# Patient Record
Sex: Female | Born: 1935 | Race: White | Hispanic: No | Smoking: Never smoker
Health system: Southern US, Community
[De-identification: ages and names within clinical notes are randomized; demographics above are authoritative.]

## PROBLEM LIST (undated history)

## (undated) DIAGNOSIS — J90 Pleural effusion, not elsewhere classified: Secondary | ICD-10-CM

## (undated) DIAGNOSIS — Z8673 Personal history of transient ischemic attack (TIA), and cerebral infarction without residual deficits: Secondary | ICD-10-CM

## (undated) DIAGNOSIS — I4819 Other persistent atrial fibrillation: Secondary | ICD-10-CM

## (undated) DIAGNOSIS — H269 Unspecified cataract: Secondary | ICD-10-CM

## (undated) DIAGNOSIS — G8929 Other chronic pain: Secondary | ICD-10-CM

## (undated) DIAGNOSIS — M5030 Other cervical disc degeneration, unspecified cervical region: Secondary | ICD-10-CM

## (undated) DIAGNOSIS — F419 Anxiety disorder, unspecified: Secondary | ICD-10-CM

## (undated) DIAGNOSIS — M1990 Unspecified osteoarthritis, unspecified site: Secondary | ICD-10-CM

## (undated) DIAGNOSIS — N649 Disorder of breast, unspecified: Secondary | ICD-10-CM

## (undated) DIAGNOSIS — I517 Cardiomegaly: Secondary | ICD-10-CM

## (undated) DIAGNOSIS — I10 Essential (primary) hypertension: Secondary | ICD-10-CM

## (undated) DIAGNOSIS — F0390 Unspecified dementia without behavioral disturbance: Secondary | ICD-10-CM

## (undated) DIAGNOSIS — R42 Dizziness and giddiness: Secondary | ICD-10-CM

## (undated) DIAGNOSIS — E785 Hyperlipidemia, unspecified: Secondary | ICD-10-CM

## (undated) DIAGNOSIS — I639 Cerebral infarction, unspecified: Secondary | ICD-10-CM

## (undated) DIAGNOSIS — H8110 Benign paroxysmal vertigo, unspecified ear: Secondary | ICD-10-CM

## (undated) DIAGNOSIS — Q676 Pectus excavatum: Secondary | ICD-10-CM

## (undated) DIAGNOSIS — S5290XA Unspecified fracture of unspecified forearm, initial encounter for closed fracture: Secondary | ICD-10-CM

## (undated) DIAGNOSIS — R911 Solitary pulmonary nodule: Secondary | ICD-10-CM

## (undated) DIAGNOSIS — U071 COVID-19: Secondary | ICD-10-CM

## (undated) DIAGNOSIS — E559 Vitamin D deficiency, unspecified: Secondary | ICD-10-CM

## (undated) DIAGNOSIS — Z8542 Personal history of malignant neoplasm of other parts of uterus: Secondary | ICD-10-CM

## (undated) DIAGNOSIS — J1282 Pneumonia due to coronavirus disease 2019: Secondary | ICD-10-CM

## (undated) DIAGNOSIS — M542 Cervicalgia: Secondary | ICD-10-CM

## (undated) DIAGNOSIS — G4700 Insomnia, unspecified: Secondary | ICD-10-CM

## (undated) DIAGNOSIS — K589 Irritable bowel syndrome without diarrhea: Secondary | ICD-10-CM

## (undated) DIAGNOSIS — Z9289 Personal history of other medical treatment: Secondary | ICD-10-CM

## (undated) DIAGNOSIS — M5020 Other cervical disc displacement, unspecified cervical region: Secondary | ICD-10-CM

## (undated) HISTORY — DX: Personal history of other medical treatment: Z92.89

## (undated) HISTORY — DX: Cerebral infarction, unspecified: I63.9

## (undated) HISTORY — DX: Personal history of transient ischemic attack (TIA), and cerebral infarction without residual deficits: Z86.73

## (undated) HISTORY — DX: Irritable bowel syndrome, unspecified: K58.9

## (undated) HISTORY — DX: Pleural effusion, not elsewhere classified: J90

## (undated) HISTORY — DX: Unspecified osteoarthritis, unspecified site: M19.90

## (undated) HISTORY — DX: Other chronic pain: G89.29

## (undated) HISTORY — DX: Hyperlipidemia, unspecified: E78.5

## (undated) HISTORY — DX: Disorder of breast, unspecified: N64.9

## (undated) HISTORY — DX: Cardiomegaly: I51.7

## (undated) HISTORY — DX: Personal history of malignant neoplasm of other parts of uterus: Z85.42

## (undated) HISTORY — DX: Unspecified fracture of unspecified forearm, initial encounter for closed fracture: S52.90XA

## (undated) HISTORY — DX: Anxiety disorder, unspecified: F41.9

## (undated) HISTORY — DX: Vitamin D deficiency, unspecified: E55.9

## (undated) HISTORY — DX: Pectus excavatum: Q67.6

## (undated) HISTORY — DX: Pneumonia due to coronavirus disease 2019: J12.82

## (undated) HISTORY — DX: Benign paroxysmal vertigo, unspecified ear: H81.10

## (undated) HISTORY — DX: Other cervical disc degeneration, unspecified cervical region: M50.30

## (undated) HISTORY — DX: Other cervical disc displacement, unspecified cervical region: M50.20

## (undated) HISTORY — DX: Cervicalgia: M54.2

## (undated) HISTORY — DX: COVID-19: U07.1

## (undated) HISTORY — DX: Insomnia, unspecified: G47.00

## (undated) HISTORY — DX: Solitary pulmonary nodule: R91.1

## (undated) HISTORY — DX: Unspecified cataract: H26.9

## (undated) HISTORY — DX: Other persistent atrial fibrillation: I48.19

---

## 1998-07-30 ENCOUNTER — Other Ambulatory Visit: Admission: RE | Admit: 1998-07-30 | Discharge: 1998-07-30 | Payer: Self-pay | Admitting: Obstetrics and Gynecology

## 1998-08-06 ENCOUNTER — Emergency Department (HOSPITAL_COMMUNITY): Admission: EM | Admit: 1998-08-06 | Discharge: 1998-08-06 | Payer: Self-pay | Admitting: Emergency Medicine

## 1999-01-11 ENCOUNTER — Emergency Department (HOSPITAL_COMMUNITY): Admission: EM | Admit: 1999-01-11 | Discharge: 1999-01-11 | Payer: Self-pay | Admitting: Emergency Medicine

## 2001-04-01 ENCOUNTER — Other Ambulatory Visit: Admission: RE | Admit: 2001-04-01 | Discharge: 2001-04-01 | Payer: Self-pay | Admitting: Gynecology

## 2001-11-11 ENCOUNTER — Encounter: Payer: Self-pay | Admitting: Internal Medicine

## 2001-11-15 ENCOUNTER — Encounter: Payer: Self-pay | Admitting: Internal Medicine

## 2001-12-26 ENCOUNTER — Encounter: Admission: RE | Admit: 2001-12-26 | Discharge: 2001-12-26 | Payer: Self-pay | Admitting: *Deleted

## 2001-12-26 ENCOUNTER — Encounter: Payer: Self-pay | Admitting: *Deleted

## 2002-01-15 ENCOUNTER — Encounter: Payer: Self-pay | Admitting: Internal Medicine

## 2002-02-17 ENCOUNTER — Emergency Department (HOSPITAL_COMMUNITY): Admission: EM | Admit: 2002-02-17 | Discharge: 2002-02-17 | Payer: Self-pay | Admitting: Emergency Medicine

## 2003-03-25 ENCOUNTER — Encounter: Payer: Self-pay | Admitting: Cardiology

## 2003-03-25 ENCOUNTER — Encounter: Admission: RE | Admit: 2003-03-25 | Discharge: 2003-03-25 | Payer: Self-pay | Admitting: Cardiology

## 2003-11-25 ENCOUNTER — Emergency Department (HOSPITAL_COMMUNITY): Admission: EM | Admit: 2003-11-25 | Discharge: 2003-11-25 | Payer: Self-pay | Admitting: Emergency Medicine

## 2003-12-02 ENCOUNTER — Emergency Department (HOSPITAL_COMMUNITY): Admission: AD | Admit: 2003-12-02 | Discharge: 2003-12-02 | Payer: Self-pay | Admitting: Family Medicine

## 2003-12-07 ENCOUNTER — Emergency Department (HOSPITAL_COMMUNITY): Admission: EM | Admit: 2003-12-07 | Discharge: 2003-12-07 | Payer: Self-pay | Admitting: Family Medicine

## 2004-03-24 ENCOUNTER — Emergency Department (HOSPITAL_COMMUNITY): Admission: EM | Admit: 2004-03-24 | Discharge: 2004-03-24 | Payer: Self-pay | Admitting: Emergency Medicine

## 2004-04-16 ENCOUNTER — Emergency Department (HOSPITAL_COMMUNITY): Admission: EM | Admit: 2004-04-16 | Discharge: 2004-04-16 | Payer: Self-pay | Admitting: Family Medicine

## 2004-07-07 ENCOUNTER — Encounter: Admission: RE | Admit: 2004-07-07 | Discharge: 2004-07-07 | Payer: Self-pay | Admitting: Gynecology

## 2004-12-13 ENCOUNTER — Encounter: Admission: RE | Admit: 2004-12-13 | Discharge: 2004-12-13 | Payer: Self-pay | Admitting: Family Medicine

## 2005-06-16 ENCOUNTER — Emergency Department (HOSPITAL_COMMUNITY): Admission: EM | Admit: 2005-06-16 | Discharge: 2005-06-16 | Payer: Self-pay | Admitting: Family Medicine

## 2005-08-08 ENCOUNTER — Encounter: Admission: RE | Admit: 2005-08-08 | Discharge: 2005-08-08 | Payer: Self-pay | Admitting: Family Medicine

## 2005-10-10 ENCOUNTER — Emergency Department (HOSPITAL_COMMUNITY): Admission: EM | Admit: 2005-10-10 | Discharge: 2005-10-10 | Payer: Self-pay | Admitting: Family Medicine

## 2006-06-27 ENCOUNTER — Emergency Department (HOSPITAL_COMMUNITY): Admission: EM | Admit: 2006-06-27 | Discharge: 2006-06-27 | Payer: Self-pay | Admitting: Family Medicine

## 2006-11-14 ENCOUNTER — Emergency Department (HOSPITAL_COMMUNITY): Admission: EM | Admit: 2006-11-14 | Discharge: 2006-11-14 | Payer: Self-pay | Admitting: Emergency Medicine

## 2007-04-18 ENCOUNTER — Emergency Department (HOSPITAL_COMMUNITY): Admission: EM | Admit: 2007-04-18 | Discharge: 2007-04-18 | Payer: Self-pay | Admitting: Emergency Medicine

## 2007-08-26 ENCOUNTER — Encounter: Admission: RE | Admit: 2007-08-26 | Discharge: 2007-08-26 | Payer: Self-pay | Admitting: Orthopedic Surgery

## 2007-09-12 HISTORY — PX: CATARACT EXTRACTION, BILATERAL: SHX1313

## 2008-01-21 ENCOUNTER — Encounter: Admission: RE | Admit: 2008-01-21 | Discharge: 2008-01-21 | Payer: Self-pay | Admitting: Internal Medicine

## 2008-06-05 ENCOUNTER — Encounter: Admission: RE | Admit: 2008-06-05 | Discharge: 2008-06-05 | Payer: Self-pay | Admitting: Internal Medicine

## 2008-09-26 ENCOUNTER — Emergency Department (HOSPITAL_COMMUNITY): Admission: EM | Admit: 2008-09-26 | Discharge: 2008-09-26 | Payer: Self-pay | Admitting: Family Medicine

## 2008-11-06 ENCOUNTER — Encounter
Admission: RE | Admit: 2008-11-06 | Discharge: 2008-11-06 | Payer: Self-pay | Admitting: Physical Medicine and Rehabilitation

## 2008-12-19 ENCOUNTER — Emergency Department (HOSPITAL_COMMUNITY): Admission: EM | Admit: 2008-12-19 | Discharge: 2008-12-19 | Payer: Self-pay | Admitting: Family Medicine

## 2009-07-17 ENCOUNTER — Encounter: Admission: RE | Admit: 2009-07-17 | Discharge: 2009-07-17 | Payer: Self-pay | Admitting: Orthopedic Surgery

## 2009-09-11 HISTORY — PX: LAPAROSCOPIC HYSTERECTOMY: SHX1926

## 2009-09-11 HISTORY — PX: ABDOMINAL HYSTERECTOMY: SHX81

## 2009-12-01 ENCOUNTER — Encounter: Admission: RE | Admit: 2009-12-01 | Discharge: 2009-12-01 | Payer: Self-pay | Admitting: Neurological Surgery

## 2009-12-09 ENCOUNTER — Other Ambulatory Visit: Admission: RE | Admit: 2009-12-09 | Discharge: 2009-12-09 | Payer: Self-pay | Admitting: Family Medicine

## 2010-02-04 ENCOUNTER — Emergency Department (HOSPITAL_COMMUNITY): Admission: EM | Admit: 2010-02-04 | Discharge: 2010-02-05 | Payer: Self-pay | Admitting: Emergency Medicine

## 2010-02-10 ENCOUNTER — Encounter: Payer: Self-pay | Admitting: Internal Medicine

## 2010-02-15 DIAGNOSIS — I4819 Other persistent atrial fibrillation: Secondary | ICD-10-CM | POA: Insufficient documentation

## 2010-02-16 ENCOUNTER — Ambulatory Visit: Payer: Self-pay | Admitting: Internal Medicine

## 2010-02-16 DIAGNOSIS — I10 Essential (primary) hypertension: Secondary | ICD-10-CM | POA: Insufficient documentation

## 2010-02-21 ENCOUNTER — Ambulatory Visit (HOSPITAL_COMMUNITY): Admission: RE | Admit: 2010-02-21 | Discharge: 2010-02-21 | Payer: Self-pay | Admitting: Obstetrics and Gynecology

## 2010-04-07 ENCOUNTER — Ambulatory Visit: Admission: RE | Admit: 2010-04-07 | Discharge: 2010-04-07 | Payer: Self-pay | Admitting: Gynecologic Oncology

## 2010-04-19 ENCOUNTER — Ambulatory Visit: Admission: RE | Admit: 2010-04-19 | Discharge: 2010-04-19 | Payer: Self-pay | Admitting: Gynecologic Oncology

## 2010-04-19 ENCOUNTER — Encounter: Payer: Self-pay | Admitting: Internal Medicine

## 2010-07-29 ENCOUNTER — Emergency Department (HOSPITAL_COMMUNITY): Admission: EM | Admit: 2010-07-29 | Discharge: 2010-07-29 | Payer: Self-pay | Admitting: Emergency Medicine

## 2010-10-02 ENCOUNTER — Encounter: Payer: Self-pay | Admitting: Family Medicine

## 2010-10-11 NOTE — Letter (Signed)
Summary: [facility]   Imported By: Marylou Mccoy 03/09/2010 14:46:52  _____________________________________________________________________  External Attachment:    Type:   Image     Comment:   External Document

## 2010-10-11 NOTE — Letter (Signed)
Summary: Stress  Stress   Imported By: Marylou Mccoy 03/09/2010 14:49:02  _____________________________________________________________________  External Attachment:    Type:   Image     Comment:   External Document

## 2010-10-11 NOTE — Consult Note (Signed)
[facility]   Imported By: Roderic Ovens 05/03/2010 10:17:19  _____________________________________________________________________  External Attachment:    Type:   Image     Comment:   External Document

## 2010-10-11 NOTE — Assessment & Plan Note (Signed)
Summary: nep/atrial fib/surgical clearance/ GYN surgery   Visit Type:  Initial Consult Primary Provider:  Larina Earthly, Md   History of Present Illness: Tiffany Black is referred today for evaluation of atrial fibrillation and preop evaluation.  The patient is followed by Dr. Felipa Eth.  She gives a remote h/o atrial tachycardia and has had longstanding HTN.  The patient was seen in the ER several weeks ago with atrial fibrillation and a rapid ventricular response which spontaneously terminated.  Since then she has had no additional symptomatic atrial fibrillation.  When in atrial fib she feels palpitations and is anxious.  She has also had difficult to control HTN.  She denies syncope.  Current Medications (verified): 1)  Labetalol Hcl 200 Mg Tabs (Labetalol Hcl) .... Take One Tablet By Mouth Twice A Day 2)  Felodipine 5 Mg Xr24h-Tab (Felodipine) .... Take One Tablet By Mouth Twice Daily. 3)  Diovan 80 Mg Tabs (Valsartan) .... 1/2 Tab Two Times A Day 4)  Furosemide 20 Mg Tabs (Furosemide) .... Take One Tablet By Mouth Daily. 5)  Potassium Chloride Cr 10 Meq Cr-Caps (Potassium Chloride) .... Take One Tablet By Mouth Daily 6)  Ambien 10 Mg Tabs (Zolpidem Tartrate) .... At Bedtime  Allergies (verified): 1)  ! Penicillin 2)  ! Sulfa 3)  ! Hydrochlorothiazide 4)  ! Codeine  Past History:  Past Medical History: Last updated: 02/15/2010 Current Problems:  ATRIAL FIBRILLATION (ICD-427.31)    Family History: Mother died at 47 Father died at 18 (MI)  Social History: Married No tobacco or ETOH.  Review of Systems       All systems reviewed and negative except as noted in the HPI.  Vital Signs:  Patient profile:   77 year old female Height:      65 inches Weight:      171 pounds BMI:     28.56 Pulse rate:   73 / minute BP sitting:   158 / 88  (left arm)  Vitals Entered By: Laurance Flatten CMA (February 16, 2010 9:41 AM)  Physical Exam  General:  Well developed, well nourished, in no  acute distress.  HEENT: normal Neck: supple. No JVD. Carotids 2+ bilaterally no bruits Cor: RRR no rubs, gallops or murmur except for a soft S4. Lungs: CTA with no wheezes, rales, or rhonchi. Ab: soft, nontender. nondistended. No HSM. Good bowel sounds Ext: warm. no cyanosis, clubbing or edema Neuro: alert and oriented. Grossly nonfocal. affect pleasant    EKG  Procedure date:  02/16/2010  Findings:      Normal sinus rhythm with rate of:  62.  Impression & Recommendations:  Problem # 1:  PREOPERATIVE EXAMINATION (ICD-V72.84) Her risk of major cardiovascular complications from her pending gyn procedure is low and I have encouraged her to proceed.  Problem # 2:  ESSENTIAL HYPERTENSION, BENIGN (ICD-401.1)  Her updated medication list for this problem includes:    Labetalol Hcl 200 Mg Tabs (Labetalol hcl) .Marland Kitchen... Take one tablet by mouth twice a day    Felodipine 5 Mg Xr24h-tab (Felodipine) .Marland Kitchen... Take one tablet by mouth twice daily.    Furosemide 20 Mg Tabs (Furosemide) .Marland Kitchen... Take one tablet by mouth daily.    Diovan 160 Mg Tabs (Valsartan) .Marland Kitchen... Take 1/2  tablet by mouth two times a day  Problem # 3:  ATRIAL FIBRILLATION (ICD-427.31) Her biggest issue is thromboembolic prevention.  I have recommended she take either coumadin or pradaxa and she is considering her options and will let us know  which she would like to take after her gyn procedure. Her updated medication list for this problem includes:    Labetalol Hcl 200 Mg Tabs (Labetalol hcl) .Marland Kitchen... Take one tablet by mouth twice a day  Patient Instructions: 1)  Your physician recommends that you schedule a follow-up appointment in: 2-3 months with Dr Ladona Ridgel 2)  Your physician recommends that you continue on your current medications as directed. Please refer to the Current Medication list given to you today.

## 2010-10-11 NOTE — Letter (Signed)
Summary: [facility]   Imported By: Marylou Mccoy 03/09/2010 15:07:40  _____________________________________________________________________  External Attachment:    Type:   Image     Comment:   External Document

## 2010-11-22 LAB — POCT CARDIAC MARKERS
CKMB, poc: 1.2 ng/mL (ref 1.0–8.0)
Troponin i, poc: <0.05 ng/mL (ref 0.00–0.09)

## 2010-11-22 LAB — CBC: Hemoglobin: 14.3 g/dL (ref 12.0–15.0)

## 2010-11-22 LAB — COMPREHENSIVE METABOLIC PANEL
Albumin: 4.4 g/dL (ref 3.5–5.2)
BUN: 9 mg/dL (ref 6–23)
CO2: 26 mEq/L (ref 19–32)
GFR calc non Af Amer: >60 mL/min (ref >60)
Glucose, Bld: 132 mg/dL — ABNORMAL HIGH (ref 70–99)
Potassium: 4.1 mEq/L (ref 3.5–5.1)
Sodium: 141 mEq/L (ref 135–145)
Total Protein: 7.5 g/dL (ref 6.0–8.3)

## 2010-11-22 LAB — URINALYSIS, ROUTINE W REFLEX MICROSCOPIC
Bilirubin Urine: NEGATIVE
Glucose, UA: NEGATIVE mg/dL
Hgb urine dipstick: NEGATIVE
Protein, ur: NEGATIVE mg/dL
Urobilinogen, UA: 0.2 mg/dL (ref 0.0–1.0)

## 2010-11-22 LAB — DIFFERENTIAL
Basophils Absolute: 0 10*3/uL (ref 0.0–0.1)
Basophils Relative: 0 % (ref 0–1)
Eosinophils Absolute: 0.1 10*3/uL (ref 0.0–0.7)
Eosinophils Relative: 1 % (ref 0–5)
Lymphs Abs: 1.2 10*3/uL (ref 0.7–4.0)
Monocytes Absolute: 0.4 10*3/uL (ref 0.1–1.0)
Monocytes Relative: 4 % (ref 3–12)

## 2010-11-28 LAB — DIFFERENTIAL
Lymphocytes Relative: 25 % (ref 12–46)
Monocytes Relative: 6 % (ref 3–12)
Neutro Abs: 4.5 10*3/uL (ref 1.7–7.7)
Neutrophils Relative %: 65 % (ref 43–77)

## 2010-11-28 LAB — TSH: TSH: 2.395 u[IU]/mL (ref 0.350–4.500)

## 2010-11-28 LAB — CBC
HCT: 40.5 % (ref 36.0–46.0)
Hemoglobin: 14.4 g/dL (ref 12.0–15.0)
Hemoglobin: 14 g/dL (ref 12.0–15.0)
MCHC: 34.3 g/dL (ref 30.0–36.0)
WBC: 7 10*3/uL (ref 4.0–10.5)

## 2010-11-28 LAB — T3 UPTAKE: T3 Uptake Ratio: 30.2 % (ref 22.5–37.0)

## 2010-11-28 LAB — COMPREHENSIVE METABOLIC PANEL
ALT: 19 U/L (ref 0–35)
Alkaline Phosphatase: 70 U/L (ref 39–117)
BUN: 9 mg/dL (ref 6–23)
Chloride: 105 mEq/L (ref 96–112)
GFR calc non Af Amer: >60 mL/min (ref >60)
Glucose, Bld: 95 mg/dL (ref 70–99)
Potassium: 3.8 mEq/L (ref 3.5–5.1)

## 2010-11-28 LAB — POCT I-STAT, CHEM 8
BUN: 11 mg/dL (ref 6–23)
Chloride: 111 mEq/L (ref 96–112)
Hemoglobin: 15.3 g/dL — ABNORMAL HIGH (ref 12.0–15.0)
Potassium: 4.5 mEq/L (ref 3.5–5.1)

## 2010-11-28 LAB — POCT CARDIAC MARKERS
CKMB, poc: 1.5 ng/mL (ref 1.0–8.0)
Troponin i, poc: <0.05 ng/mL (ref 0.00–0.09)

## 2010-11-28 LAB — URINALYSIS, ROUTINE W REFLEX MICROSCOPIC
Bilirubin Urine: NEGATIVE
Ketones, ur: NEGATIVE mg/dL
Nitrite: NEGATIVE
Protein, ur: NEGATIVE mg/dL
Urobilinogen, UA: 0.2 mg/dL (ref 0.0–1.0)

## 2010-11-28 LAB — T4, FREE: Free T4: 1.12 ng/dL (ref 0.80–1.80)

## 2010-11-28 LAB — T3, FREE: T3, Free: 3.1 pg/mL (ref 2.3–4.2)

## 2011-01-06 ENCOUNTER — Emergency Department (HOSPITAL_COMMUNITY)
Admission: EM | Admit: 2011-01-06 | Discharge: 2011-01-06 | Disposition: A | Discharge status: Home / Self Care | Payer: Medicare Other | Attending: Emergency Medicine | Admitting: Emergency Medicine

## 2011-01-06 ENCOUNTER — Emergency Department (HOSPITAL_COMMUNITY): Payer: Medicare Other

## 2011-01-06 DIAGNOSIS — I10 Essential (primary) hypertension: Secondary | ICD-10-CM | POA: Insufficient documentation

## 2011-01-06 DIAGNOSIS — Z8542 Personal history of malignant neoplasm of other parts of uterus: Secondary | ICD-10-CM | POA: Insufficient documentation

## 2011-01-06 DIAGNOSIS — J322 Chronic ethmoidal sinusitis: Secondary | ICD-10-CM | POA: Insufficient documentation

## 2011-01-06 DIAGNOSIS — F411 Generalized anxiety disorder: Secondary | ICD-10-CM | POA: Insufficient documentation

## 2011-01-06 DIAGNOSIS — R42 Dizziness and giddiness: Secondary | ICD-10-CM | POA: Insufficient documentation

## 2011-01-06 LAB — POCT CARDIAC MARKERS
CKMB, poc: 1.1 ng/mL (ref 1.0–8.0)
Myoglobin, poc: 81.6 ng/mL (ref 12–200)
Troponin i, poc: <0.05 ng/mL (ref 0.00–0.09)

## 2011-01-06 LAB — DIFFERENTIAL
Basophils Absolute: 0.1 10*3/uL (ref 0.0–0.1)
Eosinophils Absolute: 0.3 10*3/uL (ref 0.0–0.7)
Lymphocytes Relative: 23 % (ref 12–46)
Monocytes Absolute: 0.5 10*3/uL (ref 0.1–1.0)
Neutro Abs: 4.8 10*3/uL (ref 1.7–7.7)
Neutrophils Relative %: 65 % (ref 43–77)

## 2011-01-06 LAB — CBC
MCH: 30.3 pg (ref 26.0–34.0)
RBC: 4.62 MIL/uL (ref 3.87–5.11)
RDW: 13.2 % (ref 11.5–15.5)
WBC: 7.4 10*3/uL (ref 4.0–10.5)

## 2011-01-06 LAB — URINE MICROSCOPIC-ADD ON

## 2011-01-06 LAB — BASIC METABOLIC PANEL
BUN: 8 mg/dL (ref 6–23)
Chloride: 107 mEq/L (ref 96–112)
Creatinine, Ser: 0.7 mg/dL (ref 0.4–1.2)
Glucose, Bld: 126 mg/dL — ABNORMAL HIGH (ref 70–99)

## 2011-01-06 LAB — URINALYSIS, ROUTINE W REFLEX MICROSCOPIC
Bilirubin Urine: NEGATIVE
Hgb urine dipstick: NEGATIVE
Specific Gravity, Urine: 1.009 (ref 1.005–1.030)
pH: 6 (ref 5.0–8.0)

## 2011-01-06 LAB — GLUCOSE, CAPILLARY: Glucose-Capillary: 124 mg/dL — ABNORMAL HIGH (ref 70–99)

## 2011-01-08 LAB — URINE CULTURE: Colony Count: 65000

## 2011-05-19 ENCOUNTER — Other Ambulatory Visit: Payer: Self-pay | Admitting: Internal Medicine

## 2011-05-19 DIAGNOSIS — Z1231 Encounter for screening mammogram for malignant neoplasm of breast: Secondary | ICD-10-CM

## 2011-06-21 ENCOUNTER — Other Ambulatory Visit: Payer: Medicare Other

## 2011-06-21 ENCOUNTER — Ambulatory Visit: Payer: Medicare Other

## 2011-08-03 ENCOUNTER — Emergency Department (HOSPITAL_COMMUNITY)
Admission: EM | Admit: 2011-08-03 | Discharge: 2011-08-04 | Discharge status: Against Medical Advice | Payer: Medicare Other | Attending: Emergency Medicine | Admitting: Emergency Medicine

## 2011-08-03 ENCOUNTER — Encounter: Payer: Self-pay | Admitting: *Deleted

## 2011-08-03 DIAGNOSIS — Z0389 Encounter for observation for other suspected diseases and conditions ruled out: Secondary | ICD-10-CM | POA: Insufficient documentation

## 2011-08-03 HISTORY — DX: Unspecified osteoarthritis, unspecified site: M19.90

## 2011-08-03 HISTORY — DX: Essential (primary) hypertension: I10

## 2011-08-03 NOTE — ED Notes (Signed)
Pt in c/o neck and back pain since yesterday, denies injury, states pain is worse with movement, states she thinks she strained a muscle

## 2011-11-10 ENCOUNTER — Encounter: Payer: Self-pay | Admitting: Nurse Practitioner

## 2011-11-13 ENCOUNTER — Other Ambulatory Visit: Payer: Medicare Other

## 2012-01-18 ENCOUNTER — Telehealth: Payer: Self-pay | Admitting: Internal Medicine

## 2012-01-18 NOTE — Telephone Encounter (Signed)
Monday pt had SOB and rapid pulse for 5-6 hours and did not call anyone at that time but wants to be seen today

## 2012-01-18 NOTE — Telephone Encounter (Signed)
Called and spoke with patient.  Monday evening late she leaned over and her heart started racing BP 103/101  HR 118.  Hr went up to 125.  This started at 6 and around 11 quit

## 2012-01-18 NOTE — Telephone Encounter (Signed)
She has not seen Dr Ladona Ridgel since 04/2010 and has not had any problems with her heart rhythm mainly BP( sees doctor in [place] for HTN).  She was seen for pre-op evaluation in 2011 and nothing since.  With her episode on Mon night see just wants to have an EKG and have someone listen to her heart.  I explained to her that as it is normal now, we may not make any changes but will glad to see her to get an EKG.  She was very Adult nurse

## 2012-01-19 ENCOUNTER — Encounter: Payer: Self-pay | Admitting: Nurse Practitioner

## 2012-01-19 ENCOUNTER — Ambulatory Visit (INDEPENDENT_AMBULATORY_CARE_PROVIDER_SITE_OTHER): Payer: Medicare Other | Admitting: Nurse Practitioner

## 2012-01-19 VITALS — BP 160/90 | Ht 64.0 in | Wt 175.0 lb

## 2012-01-19 DIAGNOSIS — I480 Paroxysmal atrial fibrillation: Secondary | ICD-10-CM

## 2012-01-19 DIAGNOSIS — R9431 Abnormal electrocardiogram [ECG] [EKG]: Secondary | ICD-10-CM

## 2012-01-19 DIAGNOSIS — I4891 Unspecified atrial fibrillation: Secondary | ICD-10-CM

## 2012-01-19 DIAGNOSIS — I10 Essential (primary) hypertension: Secondary | ICD-10-CM

## 2012-01-19 MED ORDER — ASPIRIN 325 MG PO TABS: 325.0000 mg | ORAL_TABLET | Freq: Every day | ORAL | Status: DC

## 2012-01-19 NOTE — Progress Notes (Signed)
Tiffany Black Date of Birth: Apr 15, 1934 Medical Record #[id number]  History of Present Illness: Tiffany Black is seen today for a work in visit. She is seen for Dr. Ladona Ridgel. She has a history of PAF and long standing palpitations. No known CAD and no recent stress testing or evaluation noted but has multiple CV risk factors. She has had long standing HTN and is treated by a physician in [place].   She comes in today. She is here to "just let someone listen to my heart". [place] D[place] was just too far to travel. She notes that she has done basically well since last seen by Dr. Ladona Ridgel in 2011. This past Monday evening, she had about a 6 hour episode where she was short of breath. She had palpitations with some chest discomfort. Her blood pressure was 103/101 (?) with a heart rate of 125. She has not had recurrence. She is not very active. She used a motorized grocery cart yesterday for shopping because of fatigue and dyspnea. She remains anxious. She is not on aspirin. Says she controls her blood thru her diet. Complains of chronic pain in the left side of her head and says she may be having some type of surgery with Dr. Danielle Dess. She has had labs done by Dr. Felipa Eth this past March. She is not using excessive caffeine. She is not lightheaded or dizzy. No syncope reported. She notes lability in her blood pressure that is chronic.   Current Outpatient Prescriptions on File Prior to Visit  Medication Sig Dispense Refill  . felodipine (PLENDIL) 5 MG 24 hr tablet Take 5 mg by mouth daily.        . furosemide (LASIX) 20 MG tablet Take 20 mg by mouth daily.       Marland Kitchen labetalol (NORMODYNE) 100 MG tablet Take 200 mg by mouth 2 (two) times daily. 2 tablets daily      . meclizine (ANTIVERT) 25 MG tablet Take 25 mg by mouth 3 (three) times daily as needed.        . potassium chloride (KLOR-CON) 10 MEQ CR tablet Take 10 mEq by mouth daily.        . valsartan (DIOVAN) 80 MG tablet Take 80 mg by mouth 2 (two) times daily.        Marland Kitchen zolpidem (AMBIEN) 5 MG tablet Take 5 mg by mouth at bedtime as needed.          Allergies  Allergen Reactions  . Codeine   . Hydrochlorothiazide   . Penicillins   . Sulfonamide Derivatives     Past Medical History  Diagnosis Date  . Hypertension   . Arthritis     History reviewed. No pertinent past surgical history.  History  Smoking status  . Never Smoker   Smokeless tobacco  . Not on file    History  Alcohol Use No    Family History  Problem Relation Age of Onset  . Heart attack Father   . Heart disease Father     Review of Systems: The review of systems is per the HPI.  All other systems were reviewed and are negative.  Physical Exam: BP 170/92  Ht 5\' 4"  (1.626 m)  Wt 175 lb (79.379 kg)  BMI 30.04 kg/m2 Repeat blood pressure by me is 160/90. Patient is quite anxious but in no acute distress. Skin is warm and dry. Color is normal.  HEENT is unremarkable. Normocephalic/atraumatic. PERRL. Sclera are nonicteric. Neck is supple. No masses. No JVD. Lungs  are clear. Cardiac exam shows a regular rate and rhythm. Abdomen is soft. Extremities are without edema. Gait and ROM are intact. No gross neurologic deficits noted.  LABORATORY DATA: Her EKG today shows sinus rhythm. She has inferior ST and T wave changes. These appear worse when compared to her last tracing from 2012 in Epic.  Assessment / Plan:

## 2012-01-19 NOTE — Patient Instructions (Signed)
I would recommend that you start an aspirin each day - 325 mg  We are going to arrange for a stress test  If you have more palpitations/atrial fib, then we will need to consider other forms of anticoagulation  Keep checking your blood pressure at home and keep a diary  I will have you see Dr. Ladona Ridgel in follow up.  Call the [facility] office at [phone] if you have any questions, problems or concerns.

## 2012-01-19 NOTE — Assessment & Plan Note (Addendum)
She is in sinus rhythm today. May have had recurrent atrial fib earlier in the week. We discussed anticoagulation. Her CHADSvasc is a 4 where her CHADs is just a 2. She is only agreeable to aspirin for now. Would consider further testing if she has recurrent symptoms (echocardiogram).

## 2012-01-19 NOTE — Assessment & Plan Note (Addendum)
EKG is abnormal. She says she may be having some type of neurologic surgery with Dr. Danielle Dess. We will update her stress test. Will tentatively see her back in about 2 weeks for further discussion.

## 2012-01-19 NOTE — Assessment & Plan Note (Signed)
Hard to discern if she has adequate control at home or not. She has been on the current medicines as listed for many years.

## 2012-01-25 ENCOUNTER — Other Ambulatory Visit (HOSPITAL_COMMUNITY): Payer: Medicare Other

## 2012-01-26 ENCOUNTER — Telehealth: Payer: Self-pay | Admitting: Internal Medicine

## 2012-01-26 DIAGNOSIS — R002 Palpitations: Secondary | ICD-10-CM

## 2012-01-26 NOTE — Telephone Encounter (Signed)
Spoke with patient.  I have

## 2012-01-26 NOTE — Telephone Encounter (Signed)
Pt calling re irreg heartbeat, BP 143/114 and pulse 64, some SOB wants to know if she should go to ER? [phone]

## 2012-01-26 NOTE — Telephone Encounter (Signed)
New msg Pt had some concerns about having the stress test done on Tuesday. Please call her back

## 2012-01-26 NOTE — Telephone Encounter (Signed)
I have tried to reassure patient that she is going to be okay to hve her stress test on Tues.  She states her heart went out of rhythm and lasted 2 hours but her HR was in the 70's.  I have told her we will get a monitor to evaluate her palpitations and she can hopefully obtain this the same day as her stress test

## 2012-01-30 ENCOUNTER — Emergency Department (HOSPITAL_COMMUNITY): Payer: Medicare Other

## 2012-01-30 ENCOUNTER — Ambulatory Visit (HOSPITAL_COMMUNITY): Payer: Medicare Other

## 2012-01-30 ENCOUNTER — Emergency Department (HOSPITAL_COMMUNITY)
Admission: EM | Admit: 2012-01-30 | Discharge: 2012-01-30 | Disposition: A | Discharge status: Home / Self Care | Payer: Medicare Other | Attending: Emergency Medicine | Admitting: Emergency Medicine

## 2012-01-30 ENCOUNTER — Encounter (HOSPITAL_COMMUNITY): Payer: Self-pay | Admitting: *Deleted

## 2012-01-30 DIAGNOSIS — I4891 Unspecified atrial fibrillation: Secondary | ICD-10-CM | POA: Insufficient documentation

## 2012-01-30 DIAGNOSIS — M5137 Other intervertebral disc degeneration, lumbosacral region: Secondary | ICD-10-CM | POA: Insufficient documentation

## 2012-01-30 DIAGNOSIS — N390 Urinary tract infection, site not specified: Secondary | ICD-10-CM

## 2012-01-30 DIAGNOSIS — Z8542 Personal history of malignant neoplasm of other parts of uterus: Secondary | ICD-10-CM | POA: Insufficient documentation

## 2012-01-30 DIAGNOSIS — I10 Essential (primary) hypertension: Secondary | ICD-10-CM | POA: Insufficient documentation

## 2012-01-30 DIAGNOSIS — M51379 Other intervertebral disc degeneration, lumbosacral region without mention of lumbar back pain or lower extremity pain: Secondary | ICD-10-CM | POA: Insufficient documentation

## 2012-01-30 DIAGNOSIS — M25559 Pain in unspecified hip: Secondary | ICD-10-CM | POA: Insufficient documentation

## 2012-01-30 LAB — URINE CULTURE
Colony Count: NO GROWTH
Culture  Setup Time: 201305211338
Culture: NO GROWTH

## 2012-01-30 LAB — URINALYSIS, ROUTINE W REFLEX MICROSCOPIC
Glucose, UA: NEGATIVE mg/dL
Ketones, ur: NEGATIVE mg/dL
Nitrite: NEGATIVE
Protein, ur: NEGATIVE mg/dL
Urobilinogen, UA: 0.2 mg/dL (ref 0.0–1.0)

## 2012-01-30 LAB — URINE MICROSCOPIC-ADD ON

## 2012-01-30 MED ORDER — IBUPROFEN 200 MG PO TABS
400.0000 mg | ORAL_TABLET | Freq: Once | ORAL | Status: AC
  Administered 2012-01-30: 400 mg via ORAL
  Filled 2012-01-30: qty 2

## 2012-01-30 MED ORDER — HYDROCODONE-ACETAMINOPHEN 5-500 MG PO TABS: 1.0000 | ORAL_TABLET | Freq: Four times a day (QID) | ORAL | Status: AC | PRN

## 2012-01-30 MED ORDER — CEPHALEXIN 500 MG PO CAPS: 500.0000 mg | ORAL_CAPSULE | Freq: Three times a day (TID) | ORAL | Status: AC

## 2012-01-30 MED ORDER — IBUPROFEN 600 MG PO TABS: 600.0000 mg | ORAL_TABLET | Freq: Three times a day (TID) | ORAL | Status: AC | PRN

## 2012-01-30 NOTE — ED Notes (Signed)
Patient transported to X-ray 

## 2012-01-30 NOTE — ED Notes (Signed)
Pt informed awaiting results of culture to prescribe appropriate antibiotic; states no allergic reaction from keflex taken earlier.

## 2012-01-30 NOTE — ED Notes (Signed)
Pt seen walking down the hallway to bathroom with no apparent problems.

## 2012-01-30 NOTE — ED Provider Notes (Addendum)
History     CSN: [id number]  Arrival date & time 01/30/12  0707   First MD Initiated Contact with Patient 01/30/12 [id number]      Chief Complaint  Patient presents with  . Extremity Pain    (Consider location/radiation/quality/duration/timing/severity/associated sxs/prior treatment) Patient is a 78 y.o. female presenting with extremity pain. The history is provided by the patient.  Extremity Pain Pertinent negatives include no chest pain, no abdominal pain and no shortness of breath.  pt c/o pain right hip 'for past few months', worse when stands/walks on it. States it feels as if 'catches' then makes it painful/difficult to walk. Constant, dull pain that catches or becomes sharp at times. No back or buttock pain. +hx ddd. No radicular pain. No numbness/weakness of leg. No leg swelling. No knee pain. No lower leg pain. States pain was esp bad this morning when tried to get up/walk so came to ed. No fever or chills. No swelling.   Past Medical History  Diagnosis Date  . Hypertension   . Arthritis   . 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 Endometrial ca   . Anxiety   . HLD (hyperlipidemia)   . Cardiomegaly   . Pectus excavatum   . OA (osteoarthritis)     Past Surgical History  Procedure Date  . Cesarean section   . Laparoscopic hysterectomy 2011    Family History  Problem Relation Age of Onset  . Heart attack Father   . Heart disease Father     History  Substance Use Topics  . Smoking status: Never Smoker   . Smokeless tobacco: Not on file  . Alcohol Use: No    OB History    Grav Para Term Preterm Abortions TAB SAB Ect Mult Living                  Review of Systems  Constitutional: Negative for fever and chills.  HENT: Negative for neck pain.   Respiratory: Negative for shortness of breath.   Cardiovascular: Negative for chest pain and  palpitations.  Gastrointestinal: Negative for abdominal pain.  Genitourinary: Negative for flank pain.  Musculoskeletal: Negative for back pain.  Skin: Negative for rash.  Neurological: Negative for weakness and numbness.    Allergies  Codeine; Hydrochlorothiazide; Penicillins; and Sulfonamide derivatives  Home Medications   Current Outpatient Rx  Name Route Sig Dispense Refill  . ACETAMINOPHEN 500 MG PO TABS Oral Take 500-1,000 mg by mouth every 6 (six) hours as needed.    Marland Kitchen FELODIPINE ER 5 MG PO TB24 Oral Take 5 mg by mouth every evening.     . FUROSEMIDE 20 MG PO TABS Oral Take 20 mg by mouth daily.     Marland Kitchen LABETALOL HCL 100 MG PO TABS Oral Take 200 mg by mouth 2 (two) times daily.     Marland Kitchen MECLIZINE HCL 25 MG PO TABS Oral Take 25 mg by mouth 3 (three) times daily as needed.     Marland Kitchen POTASSIUM CHLORIDE 10 MEQ PO TBCR Oral Take 10 mEq by mouth daily.     Marland Kitchen VALSARTAN 80 MG PO TABS Oral Take 80 mg by mouth 2 (two) times daily.     Marland Kitchen ZOLPIDEM TARTRATE 5 MG PO TABS Oral Take 5 mg by mouth at bedtime as needed.       BP 172/87  Pulse 68  Temp(Src) 98.6  F (37 C) (Oral)  Resp 18  Wt 175 lb (79.379 kg)  SpO2 98%  Physical Exam  Nursing note and vitals reviewed. Constitutional: She appears well-developed and well-nourished. No distress.  Eyes: Conjunctivae are normal. No scleral icterus.  Neck: Neck supple. No tracheal deviation present.  Cardiovascular: Normal rate, regular rhythm, normal heart sounds and intact distal pulses.   Pulmonary/Chest: Effort normal and breath sounds normal. No respiratory distress.  Abdominal: Soft. Normal appearance. She exhibits no distension and no mass. There is no tenderness.       No puls mass  Musculoskeletal: She exhibits no edema.       ls spine non tender, aligned, no step off. Good rom right hip and knee comfortably. Fem and distal pulses palp. No inguinal/fem hernia. No sts or skin changes.    Neurological: She is alert.       Motor intact.     Skin: Skin is warm and dry. No rash noted.  Psychiatric: She has a normal mood and affect.    ED Course  Procedures (including critical care time)  Dg Lumbar Spine Complete  01/30/2012  *RADIOLOGY REPORT*  Clinical Data:  Right leg pain, difficulty walking  LUMBAR SPINE - COMPLETE 4+ VIEW  Comparison: 06/05/2008  Findings: Five non-rib bearing lumbar vertebrae. Osseous demineralization. Minimal broad-based levoconvex lumbar scoliosis apex L3. Multilevel disc space narrowing endplate spur formation. Scattered facet degenerative changes greater at lower lumbar spine. Vertebral body heights maintained without fracture or subluxation. No bone destruction or spondylolysis. Minimal atherosclerotic calcification aorta.  IMPRESSION: Osseous demineralization. Multilevel degenerative disc and facet disease changes of the lumbar spine with minimal levoconvex scoliosis.  Original Report Authenticated By: Lollie Marrow, M.D.   Dg Hip Complete Right  01/30/2012  *RADIOLOGY REPORT*  Clinical Data: Pain in right leg, difficulty walking  RIGHT HIP - COMPLETE 2+ VIEW  Comparison: None  Findings: Osseous demineralization. Hip and SI joint spaces preserved. Degenerative disc facet disease changes lower lumbar spine. No acute fracture, dislocation, or bone destruction. Numerous pelvic phleboliths.  IMPRESSION: Osseous demineralization. No acute abnormalities.  Original Report Authenticated By: Lollie Marrow, M.D.      MDM  No meds for pain at home today. Motrin po.   Xrays.   RRR. Pt reports plan to have ?stress test this morning, but skipped due to hip pain.   Motrin po.   Discussed xrays w pt. No fevers, good rom. No abd/groin pain/swelling or tendeness, no hip pain w rom.   Pt requests urine be checked, states had episode of small amt urine leaking out single time a couple weeks ago.  No problems w incontinence or retention since.   Pt mentions approximately 1 yr ago at [facility] had abd/pelvic ct to  eval rlq/r pelvic area pain. That ct result was obtained, no acute process noted - note was made of small pelvic/retroperitoneal lymph notes, and small lung nodule - shared w pt, pt to follow up with pcp.     Suzi Roots, MD 01/30/12 4540  Suzi Roots, MD 01/31/12 1155

## 2012-01-30 NOTE — Discharge Instructions (Signed)
Your xrays show degenerative disc changes in the lumbar area, it is possible that degenerative disc/nerve impingement could be causing the pain in your hip area.  Your lab work shows a possible urine infection - take antibiotic (keflex) as prescribed. A urine culture was sent the results of which will be back in 2 days time - have primary care doctor follow up on that result then.  Take motrin as need for pain. You may take vicodin as need for severe pain - no driving when taking. Also, do not take tylenol/acetaminophen when taking vicodin. Your ct scan at [facility] from last year was read as showing 'no acute process in the right lower abdomen/pelvic area'.  Incidental note was made of 'small right upper lung nodule' and 'numerous small retroperitoneal and pelvic lymph nodes' - make sure you discuss these findings with your primary care doctor at follow up with him. Follow up with your primary care doctor in the next 1-2 weeks for recheck - call office to arrange appointment. Your blood pressure is high today - have it rechecked at follow up with primary care doctor.  Return to ER if worse, new symptoms, fevers, leg numbness/weakness, other concern.         Degenerative Disc Disease Degenerative disc disease is a condition caused by the changes that occur in the cushions of the backbone (spinal discs) as you grow older. Spinal discs are soft and compressible discs located between the bones of the spine (vertebrae). They act like shock absorbers. Degenerative disc disease can affect the wholespine. However, the neck and lower back are most commonly affected. Many changes can occur in the spinal discs with aging, such as:  The spinal discs may dry and shrink.   Small tears may occur in the tough, outer covering of the disc (annulus).   The disc space may become smaller due to loss of water.   Abnormal growths in the bone (spurs) may occur. This can put pressure on the nerve roots exiting the  spinal canal, causing pain.   The spinal canal may become narrowed.  CAUSES  Degenerative disc disease is a condition caused by the changes that occur in the spinal discs with aging. The exact cause is not known, but there is a genetic basis for many patients. Degenerative changes can occur due to loss of fluid in the disc. This makes the disc thinner and reduces the space between the backbones. Small cracks can develop in the outer layer of the disc. This can lead to the breakdown of the disc. You are more likely to get degenerative disc disease if you are overweight. Smoking cigarettes and doing heavy work such as weightlifting can also increase your risk of this condition. Degenerative changes can start after a sudden injury. Growth of bone spurs can compress the nerve roots and cause pain.  SYMPTOMS  The symptoms vary from person to person. Some people may have no pain, while others have severe pain. The pain may be so severe that it can limit your activities. The location of the pain depends on the part of your backbone that is affected. You will have neck or arm pain if a disc in the neck area is affected. You will have pain in your back, buttocks, or legs if a disc in the lower back is affected. The pain becomes worse while bending, reaching up, or with twisting movements. The pain may start gradually and then get worse as time passes. It may also start after a  major or minor injury. You may feel numbness or tingling in the arms or legs.  DIAGNOSIS  Your caregiver will ask you about your symptoms and about activities or habits that may cause the pain. He or she may also ask about any injuries, diseases, ortreatments you have had earlier. Your caregiver will examine you to check for the range of movement that is possible in the affected area, to check for strength in your extremities, and to check for sensation in the areas of the arms and legs supplied by different nerve roots. An X-ray of the spine  may be taken. Your caregiver may suggest other imaging tests, such as a computerized magnetic scan (MRI), if needed.  TREATMENT  Treatment includes rest, modifying your activities, and applying ice and heat. Your caregiver may prescribe medicines to reduce your pain and may ask you to do some exercises to strengthen your back. In some cases, you may need surgery. You and your caregiver will decide on the treatment that is best for you. HOME CARE INSTRUCTIONS   Follow proper lifting and walking techniques as advised by your caregiver.   Maintain good posture.   Exercise regularly as advised.   Perform relaxation exercises.   Change your sitting, standing, and sleeping habits as advised. Change positions frequently.   Lose weight as advised.   Stop smoking if you smoke.   Wear supportive footwear.  SEEK MEDICAL CARE IF:  The pain does not go away within 1 to 4 weeks. SEEK IMMEDIATE MEDICAL CARE IF:   The pain is severe.   You notice weakness in your arms, hands, or legs.   You begin to lose control of your bladder or bowel.  MAKE SURE YOU:   Understand these instructions.   Will watch your condition.   Will get help right away if you are not doing well or get worse.  Document Released: 06/25/2007 Document Revised: 08/17/2011 Document Reviewed: 06/25/2007 [facility] Patient Information 2012 [place], [place].      Urinary Tract Infection Infections of the urinary tract can start in several places. A bladder infection (cystitis), a kidney infection (pyelonephritis), and a prostate infection (prostatitis) are different types of urinary tract infections (UTIs). They usually get better if treated with medicines (antibiotics) that kill germs. Take all the medicine until it is gone. You or your child may feel better in a few days, but TAKE ALL MEDICINE or the infection may not respond and may become more difficult to treat. HOME CARE INSTRUCTIONS   Drink enough water and fluids to  keep the urine clear or pale yellow. Cranberry juice is especially recommended, in addition to large amounts of water.   Avoid caffeine, tea, and carbonated beverages. They tend to irritate the bladder.   Alcohol may irritate the prostate.   Only take over-the-counter or prescription medicines for pain, discomfort, or fever as directed by your caregiver.  To prevent further infections:  Empty the bladder often. Avoid holding urine for long periods of time.   After a bowel movement, women should cleanse from front to back. Use each tissue only once.   Empty the bladder before and after sexual intercourse.  FINDING OUT THE RESULTS OF YOUR TEST Not all test results are available during your visit. If your or your child's test results are not back during the visit, make an appointment with your caregiver to find out the results. Do not assume everything is normal if you have not heard from your caregiver or the medical facility.  It is important for you to follow up on all test results. SEEK MEDICAL CARE IF:   There is back pain.   Your baby is older than 3 months with a rectal temperature of 100.5 F (38.1 C) or higher for more than 1 day.   Your or your child's problems (symptoms) are no better in 3 days. Return sooner if you or your child is getting worse.  SEEK IMMEDIATE MEDICAL CARE IF:   There is severe back pain or lower abdominal pain.   You or your child develops chills.   You have a fever.   Your baby is older than 3 months with a rectal temperature of 102 F (38.9 C) or higher.   Your baby is 70 months old or younger with a rectal temperature of 100.4 F (38 C) or higher.   There is nausea or vomiting.   There is continued burning or discomfort with urination.  MAKE SURE YOU:   Understand these instructions.   Will watch your condition.   Will get help right away if you are not doing well or get worse.  Document Released: 06/07/2005 Document Revised: 08/17/2011  Document Reviewed: 01/10/2007 [facility] Patient Information 2012 [place], [place].

## 2012-01-30 NOTE — ED Notes (Signed)
Pt states "had a hysterectomy in 2011, had no trouble with my leg until recently, it's so bad sometimes I have to wait for the pain to subside before I can walk, I also leaked some urine the other day, went to see Dr. Arelia Sneddon and he told me everything was okay, was supposed to have a stress test today because I've been having some A-fib, my right leg hurts, and (pt indicates right lower abd).

## 2012-01-31 ENCOUNTER — Other Ambulatory Visit (HOSPITAL_COMMUNITY): Payer: Self-pay | Admitting: Obstetrics and Gynecology

## 2012-01-31 DIAGNOSIS — R911 Solitary pulmonary nodule: Secondary | ICD-10-CM

## 2012-02-02 ENCOUNTER — Ambulatory Visit (HOSPITAL_COMMUNITY): Payer: Medicare Other

## 2012-02-16 ENCOUNTER — Encounter: Payer: Self-pay | Admitting: Internal Medicine

## 2012-02-16 ENCOUNTER — Ambulatory Visit (INDEPENDENT_AMBULATORY_CARE_PROVIDER_SITE_OTHER): Payer: Medicare Other | Admitting: Internal Medicine

## 2012-02-16 VITALS — BP 144/100 | HR 66 | Ht 64.0 in | Wt 174.0 lb

## 2012-02-16 DIAGNOSIS — I480 Paroxysmal atrial fibrillation: Secondary | ICD-10-CM

## 2012-02-16 DIAGNOSIS — I4891 Unspecified atrial fibrillation: Secondary | ICD-10-CM

## 2012-02-16 MED ORDER — RIVAROXABAN 20 MG PO TABS: 20.0000 mg | ORAL_TABLET | Freq: Every day | ORAL | Status: DC

## 2012-02-16 NOTE — Assessment & Plan Note (Signed)
The patient is maintaining sinus rhythm. With her history of atrial fibrillation and increasingly frequent symptoms, I am concerned about thromboembolic complications and I've recommended that she initiate systemic anticoagulation. She will continue her beta blocker therapy along with her other antihypertensive medications. If her symptoms of palpitations worsen, we could consider additional antiarrhythmic drug therapy. Alternatively, we might consider discontinuing labetalol and adding carvedilol.

## 2012-02-16 NOTE — Patient Instructions (Signed)
Please start Xarelto 20 mg a day Continue all other medications as listed  Follow up with Dr Ladona Ridgel in 6 months.

## 2012-02-16 NOTE — Progress Notes (Signed)
HPI Tiffany Black returns today for followup. I have not seen her in over 2 years. She has a history of palpitations and documented atrial fibrillation in the past. Previously, she had been resistant to anticoagulation. The patient notes that approximately 5 weeks ago she experienced an episode of palpitations lasting 5 hours. By her blood pressure cuff, her heart rate was over a 120 beats per minute. Eventually her symptoms resolved. Approximately one week later she had a second episode though didn't last quite as long. On May 21, she presented to the emergency room with increasing blood pressure and leg pain. Since then she has had no recurrent palpitations. She is bothered by hypertension. No history of stroke. Allergies  Allergen Reactions  . Codeine   . Hydrochlorothiazide   . Penicillins   . Sulfonamide Derivatives      Current Outpatient Prescriptions  Medication Sig Dispense Refill  . acetaminophen (TYLENOL) 500 MG tablet Take 500-1,000 mg by mouth every 6 (six) hours as needed.      . felodipine (PLENDIL) 5 MG 24 hr tablet Take 5 mg by mouth every evening.       . furosemide (LASIX) 20 MG tablet Take 20 mg by mouth daily.       Marland Kitchen labetalol (NORMODYNE) 100 MG tablet Take 200 mg by mouth 2 (two) times daily.       . meclizine (ANTIVERT) 25 MG tablet Take 25 mg by mouth 3 (three) times daily as needed.       . potassium chloride (KLOR-CON) 10 MEQ CR tablet Take 10 mEq by mouth daily.       . valsartan (DIOVAN) 80 MG tablet Take 80 mg by mouth 2 (two) times daily.       Marland Kitchen zolpidem (AMBIEN) 5 MG tablet Take 5 mg by mouth at bedtime as needed.       . Rivaroxaban (XARELTO) 20 MG TABS Take 20 mg by mouth daily.  30 tablet  6     Past Medical History  Diagnosis Date  . Hypertension   . Arthritis   . 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 Endometrial ca   . Anxiety     . HLD (hyperlipidemia)   . Cardiomegaly   . Pectus excavatum   . OA (osteoarthritis)     ROS:   All systems reviewed and negative except as noted in the HPI.   Past Surgical History  Procedure Date  . Cesarean section   . Laparoscopic hysterectomy 2011     Family History  Problem Relation Age of Onset  . Heart attack Father   . Heart disease Father      History   Social History  . Marital Status: Married    Spouse Name: N/A    Number of Children: N/A  . Years of Education: N/A   Occupational History  . Not on file.   Social History Main Topics  . Smoking status: Never Smoker   . Smokeless tobacco: Not on file  . Alcohol Use: No  . Drug Use: No  . Sexually Active: Yes   Other Topics Concern  . Not on file   Social History Narrative  . No narrative on file     BP 144/100  Pulse 66  Ht 5\' 4"  (1.626 m)  Wt 174 lb (78.926 kg)  BMI 29.87 kg/m2  Physical Exam:  Well appearing NAD HEENT: Unremarkable Neck:  No JVD, no thyromegally Lymphatics:  No adenopathy Back:  No CVA tenderness Lungs:  Clear except for rales in the bases bilaterally. HEART:  Regular rate rhythm, a soft systolic murmur is present at the left lower sternal border, no rubs, no clicks Abd:  soft, positive bowel sounds, no organomegally, no rebound, no guarding Ext:  2 plus pulses, no edema, no cyanosis, no clubbing Skin:  No rashes no nodules Neuro:  CN II through XII intact, motor grossly intact  EKG Normal sinus rhythm with left axis deviation. Assess/Plan:

## 2012-02-18 ENCOUNTER — Telehealth: Payer: Self-pay | Admitting: Physician Assistant

## 2012-02-18 NOTE — Telephone Encounter (Signed)
The patient was started Xarelto on 02/16/12. Last night she took her Xarelto at 6pm and took her blood pressure medicines and Plendil yesterday evening. Yesterday night she felt had nightmares and felt chilly ("I'm usually putting the air conditioning on") which is unusual for her. She feels fine this morning. No fevers, CP, SOB, visual changes or any other symptoms. No longer chilled. In the past she has been resistant to anticoagulation. She is worried if she stops it that she'll have a stroke. I told her that Xarelto may not necessarily be the culprit for her nightmares, and I would advise to continue for now and to see how she does with it tonight. If she still has side effects overnight, she should call our office tomorrow and let us know. She verbalized understanding and gratitude.

## 2012-02-19 ENCOUNTER — Telehealth: Payer: Self-pay | Admitting: Internal Medicine

## 2012-02-19 NOTE — Telephone Encounter (Signed)
Pt calling b/c even with prior authorization " I would be in the doughnut-hole and not be able to afford my medications"  This regarding Xarelto Samples placed at the desk for pt. We only had 1 box and should have more later this week. She would possibly be interested in taking Coumadin as it may be a more affordable option.She is aware she would have coumadin clinic appts to check her INR. She is aware Dr. Ladona Ridgel is out of the office this week. She would like to talk about possibly switching to Coumadin.  I have tried to give her enough samples until he returns next week.  Mylo Red RN

## 2012-02-19 NOTE — Telephone Encounter (Signed)
Fu msg Pt said she wants to switch to another med because it is so expensive.

## 2012-02-19 NOTE — Telephone Encounter (Signed)
New msg Pt has some questions about xaralto. Please call

## 2012-02-19 NOTE — Telephone Encounter (Signed)
Patient states she did not think  was going to be able to continue taken Xarelto, because the first day she took it ,she had nightmares . She took the medication yesterday and last night she was fine, no nightmares. She states may need bladder surgery later on. She would like to know,that  if she needs the surgery, does she need to stop the Xarelto prior the procedure.Patient was made aware that when she finds out that she will  Need the  surgery to call the office  with time. Dr. Ladona Ridgel will decide if to stop or continue the medication for the surgery. Patient verbalized understanding.

## 2012-02-20 NOTE — Progress Notes (Signed)
Addended by: Reine Just on: 02/20/2012 12:19 PM   Modules accepted: Orders

## 2012-02-21 ENCOUNTER — Telehealth: Payer: Self-pay | Admitting: Internal Medicine

## 2012-02-21 NOTE — Telephone Encounter (Signed)
Medication has been approved for one  Year.  PA #---[id number] Pt ID # is [id number]

## 2012-02-21 NOTE — Telephone Encounter (Signed)
Please return call to patient [facility] regarding authorization for Xeralto 20 mg tablet.  Authorization dpt can be reached at [phone].

## 2012-02-22 ENCOUNTER — Telehealth: Payer: Self-pay | Admitting: Internal Medicine

## 2012-02-22 NOTE — Telephone Encounter (Signed)
Spoke with patient. The 15 mg is the same price.  He phone is messing up and wants to use her cell number [phone]

## 2012-02-22 NOTE — Telephone Encounter (Signed)
New Problem:    Patient called in wanting to speak with you about her Rivaroxaban (XARELTO) 20 MG TABS and if taking 15mg  instead of 20mg  would make a difference in price.  Please call back.

## 2012-02-27 ENCOUNTER — Telehealth: Payer: Self-pay | Admitting: Internal Medicine

## 2012-02-27 DIAGNOSIS — I4891 Unspecified atrial fibrillation: Secondary | ICD-10-CM

## 2012-02-27 NOTE — Telephone Encounter (Signed)
Discussed with Dr Ladona Ridgel.  Patient's dizziness is probably not coming from the Xarelto we will obtain a CBC and she can continue to take her Meclizine as needed.

## 2012-02-27 NOTE — Telephone Encounter (Signed)
Please return call to patient at [phone], regarding medication and dizziness.

## 2012-02-28 ENCOUNTER — Other Ambulatory Visit (INDEPENDENT_AMBULATORY_CARE_PROVIDER_SITE_OTHER): Payer: Medicare Other

## 2012-02-28 ENCOUNTER — Encounter: Payer: Self-pay | Admitting: Internal Medicine

## 2012-02-28 DIAGNOSIS — I4891 Unspecified atrial fibrillation: Secondary | ICD-10-CM

## 2012-02-28 LAB — CBC WITH DIFFERENTIAL/PLATELET
Eosinophils Absolute: 0.2 10*3/uL (ref 0.0–0.7)
Eosinophils Relative: 3.4 % (ref 0.0–5.0)
Lymphocytes Relative: 25.3 % (ref 12.0–46.0)
MCV: 93.3 fl (ref 78.0–100.0)
Monocytes Absolute: 0.5 10*3/uL (ref 0.1–1.0)
Neutrophils Relative %: 62.1 % (ref 43.0–77.0)
Platelets: 166 10*3/uL (ref 150.0–400.0)
WBC: 6 10*3/uL (ref 4.5–10.5)

## 2012-03-06 ENCOUNTER — Encounter (HOSPITAL_COMMUNITY): Payer: Self-pay | Admitting: Emergency Medicine

## 2012-03-06 ENCOUNTER — Emergency Department (HOSPITAL_COMMUNITY)
Admission: EM | Admit: 2012-03-06 | Discharge: 2012-03-06 | Disposition: A | Discharge status: Home / Self Care | Payer: Medicare Other | Attending: Emergency Medicine | Admitting: Emergency Medicine

## 2012-03-06 DIAGNOSIS — Z885 Allergy status to narcotic agent status: Secondary | ICD-10-CM | POA: Insufficient documentation

## 2012-03-06 DIAGNOSIS — Z8542 Personal history of malignant neoplasm of other parts of uterus: Secondary | ICD-10-CM | POA: Insufficient documentation

## 2012-03-06 DIAGNOSIS — I10 Essential (primary) hypertension: Secondary | ICD-10-CM | POA: Insufficient documentation

## 2012-03-06 DIAGNOSIS — M129 Arthropathy, unspecified: Secondary | ICD-10-CM | POA: Insufficient documentation

## 2012-03-06 DIAGNOSIS — F411 Generalized anxiety disorder: Secondary | ICD-10-CM | POA: Insufficient documentation

## 2012-03-06 DIAGNOSIS — Z880 Allergy status to penicillin: Secondary | ICD-10-CM | POA: Insufficient documentation

## 2012-03-06 DIAGNOSIS — Z8249 Family history of ischemic heart disease and other diseases of the circulatory system: Secondary | ICD-10-CM | POA: Insufficient documentation

## 2012-03-06 DIAGNOSIS — E785 Hyperlipidemia, unspecified: Secondary | ICD-10-CM | POA: Insufficient documentation

## 2012-03-06 DIAGNOSIS — Z882 Allergy status to sulfonamides status: Secondary | ICD-10-CM | POA: Insufficient documentation

## 2012-03-06 DIAGNOSIS — I4891 Unspecified atrial fibrillation: Secondary | ICD-10-CM | POA: Insufficient documentation

## 2012-03-06 DIAGNOSIS — T448X5A Adverse effect of centrally-acting and adrenergic-neuron-blocking agents, initial encounter: Secondary | ICD-10-CM | POA: Insufficient documentation

## 2012-03-06 DIAGNOSIS — R079 Chest pain, unspecified: Secondary | ICD-10-CM | POA: Insufficient documentation

## 2012-03-06 DIAGNOSIS — Z139 Encounter for screening, unspecified: Secondary | ICD-10-CM

## 2012-03-06 DIAGNOSIS — Z888 Allergy status to other drugs, medicaments and biological substances status: Secondary | ICD-10-CM | POA: Insufficient documentation

## 2012-03-06 NOTE — ED Provider Notes (Signed)
History     CSN: [id number]  Arrival date & time 03/06/12  2028   First MD Initiated Contact with Patient 03/06/12 2059      Chief Complaint  Patient presents with  . Medication Reaction    (Consider location/radiation/quality/duration/timing/severity/associated sxs/prior treatment) HPI Comments: Pt presents due to elevated BP at home despite taking an extra labetalol which she often does, slight fluttering in chest and slight discomfort, transient in upper epigastrium that was brief.  She was concerned because she may have forgotten to take her Lesia Hausen which she began for paroxysmal atrial fib about 2 weeks ago.  She was worried that skipping a dose may have caused symptoms.  She feels improved now, back to baseline.  No SOB, sweats, nausea,. CP, back pain, HA, blurred vision.  She admits she was stressed because spouse recently found a lung nodule, had a CT scan and results came back today and he now needs a PET scan to assess for cancer.    The history is provided by the patient and the spouse.    Past Medical History  Diagnosis Date  . Hypertension   . Arthritis   . 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 Endometrial ca   . Anxiety   . HLD (hyperlipidemia)   . Cardiomegaly   . Pectus excavatum   . OA (osteoarthritis)     Past Surgical History  Procedure Date  . Cesarean section   . Laparoscopic hysterectomy 2011    Family History  Problem Relation Age of Onset  . Heart attack Father   . Heart disease Father     History  Substance Use Topics  . Smoking status: Never Smoker   . Smokeless tobacco: Not on file  . Alcohol Use: No    OB History    Grav Para Term Preterm Abortions TAB SAB Ect Mult Living                  Review of Systems  Constitutional: Negative for fever, chills and fatigue.  Respiratory: Positive for chest tightness.  Negative for cough and shortness of breath.   Cardiovascular: Positive for palpitations.  Gastrointestinal: Negative for nausea, vomiting, diarrhea and constipation.  Musculoskeletal: Negative for back pain.  Neurological: Negative for weakness.    Allergies  Carbamazepine; Codeine; Hydrochlorothiazide; Penicillins; and Sulfonamide derivatives  Home Medications   Current Outpatient Rx  Name Route Sig Dispense Refill  . ACETAMINOPHEN 500 MG PO TABS Oral Take 500-1,000 mg by mouth every 6 (six) hours as needed.    Marland Kitchen FELODIPINE ER 5 MG PO TB24 Oral Take 5 mg by mouth every evening.     . FUROSEMIDE 20 MG PO TABS Oral Take 20 mg by mouth daily.     Marland Kitchen LABETALOL HCL 100 MG PO TABS Oral Take 200 mg by mouth 2 (two) times daily.     Marland Kitchen MECLIZINE HCL 25 MG PO TABS Oral Take 25 mg by mouth 3 (three) times daily as needed. Dizziness    . POTASSIUM CHLORIDE 10 MEQ PO TBCR Oral Take 10 mEq by mouth daily.     Marland Kitchen RIVAROXABAN 20 MG PO TABS Oral Take 20 mg by mouth daily. 30 tablet 6  . VALSARTAN 80 MG PO TABS Oral Take 80 mg by mouth 2 (two) times daily.     Marland Kitchen ZOLPIDEM TARTRATE 5 MG PO TABS Oral  Take 5 mg by mouth at bedtime as needed.       BP 167/79  Pulse 70  Temp 98 F (36.7 C)  Resp 16  SpO2 99%  Physical Exam  Nursing note and vitals reviewed. Constitutional: She is oriented to person, place, and time. She appears well-developed and well-nourished. No distress.  HENT:  Head: Normocephalic and atraumatic.  Eyes: EOM are normal. Pupils are equal, round, and reactive to light.  Neck: Normal range of motion. Neck supple.  Cardiovascular: Normal rate.   Murmur heard. Pulmonary/Chest: Effort normal and breath sounds normal. No respiratory distress. She has no wheezes.  Abdominal: Soft. She exhibits no distension. There is no tenderness. There is no rebound and no guarding.  Neurological: She is alert and oriented to person, place, and time. She has normal strength. No cranial nerve deficit  or sensory deficit. Coordination normal.  Skin: Skin is warm and dry. No rash noted. She is not diaphoretic.  Psychiatric: Her speech is normal. Thought content normal. Her mood appears anxious.    ED Course  Procedures (including critical care time)  Labs Reviewed - No data to display No results found.   1. Screening     RA sat is 99% and I interpret as normal  ECG at time 20:56 shows NSR at rate 70, LAD, LVH, non specific T wave abn's diffuse, similar in appearance to 01/06/11.  Abn ECG by my interpretation, but no new ischemia or concerning changes.    MDM  Pt likely anxious, has resolved symptoms, moderate HTN, but no HA< or signs of HTN emergency.  Pt is reassured.  Pt's murmur is known.  Pt can follow up with PCP and resume taking xarleto tomorrow.          Gavin Pound. Keyatta Tolles, MD 03/06/12 2140

## 2012-03-06 NOTE — ED Notes (Signed)
Pt alert, nad, c/o ? Forgot to take "blood thinner", resp even unlabored, skin pwd

## 2012-03-06 NOTE — ED Notes (Signed)
Pt states name of medication Xarelto.

## 2012-03-06 NOTE — ED Notes (Addendum)
Pt states she may have forgotten to take her xarelto, usually takes dose at 5pm nightly with dinner, states she has been taking it every day since the 8th of this month and was told not to skip a dose. Pt is afraid that since she missed a dose she will "have that a-fib or a heart attack." pt denies complaints of CP, or SOB. NSR on monitor. States while at home experienced dizziness and weakness 1hr after she realized she didn't take her medicine. Denies complaints at present. States she checked her BP at home and noticed it was 170/101, pt took "an extra 100mg  of labetalol-'I have permission to take an extra dose if my BP gets that high.'

## 2012-03-06 NOTE — Discharge Instructions (Signed)
Your EKG shows a normal rhythm, no signs of atrial fibrillation.  Your blood pressure was moderately elevated, but you should discuss this further with Dr. Felipa Eth or Dr. Ladona Ridgel next week.  Please keep a record of your blood pressures this week to inform your physicians.

## 2012-03-07 ENCOUNTER — Telehealth: Payer: Self-pay | Admitting: Internal Medicine

## 2012-03-07 DIAGNOSIS — I4891 Unspecified atrial fibrillation: Secondary | ICD-10-CM

## 2012-03-07 NOTE — Telephone Encounter (Signed)
Pt would like results of cbc done last week. At last visit she was put on xarelto and last night she had to go to ED due to dizziness and her b/p was 170/104 and they found a heart murmur and she thinks her med dose is to high and she wants to discuss lowering it

## 2012-03-07 NOTE — Telephone Encounter (Signed)
Spoke with patient and she is still concerned in regards to the amount of Xarelto she is taking.  She has had multiple trips to the ER due to SOB and being very anxious in regards to all of this.  She is concerned about blood in urine so I offered a UA says already had one done and it was normal.  Then she starts talking about the "wavyness" of the EKG which I presume is her afib. She then says she went to the ER yesterday and they told her she has a murmur, of note Dr Ladona Ridgel mentioned in his note on 02/16/12.  I told her he heard that then and started the Xarelto at that point.  She is still very concerned and wants to know if Dr Ladona Ridgel feels an echo should be ordered. She is having SOB  I let her know I would discuss this with him tomorrow and call her back  She has a lot going on as they have just found a mass on her husbands lungs and he is having a PET scan

## 2012-03-08 NOTE — Telephone Encounter (Signed)
Discussed with Dr Ladona Ridgel  Will order an echo

## 2012-03-13 ENCOUNTER — Telehealth: Payer: Self-pay | Admitting: Internal Medicine

## 2012-03-13 NOTE — Telephone Encounter (Signed)
patient huband is having some medical problems. will call back when she is able to schedule echo 03/13/12.

## 2012-03-13 NOTE — Telephone Encounter (Signed)
New problem:  Call patient to inform her on the decision to become a patient of Dr. Graciela Husbands. Patient verbalize understanding that this will be on a trial bases. The office will contact her back when Dr. Ladona Ridgel & Dr. Graciela Husbands have their discussion.

## 2012-03-13 NOTE — Telephone Encounter (Signed)
Left message for patient to call and schedule echo. 

## 2012-03-26 ENCOUNTER — Telehealth: Payer: Self-pay | Admitting: Physician Assistant

## 2012-03-26 NOTE — Telephone Encounter (Signed)
Patient called in this evening regarding anxiety. Her husband has been undergoing medical testing with pulmonology to evaluate possible cancer and she has had significant stress. Her blood pressure is running a little higher this evening - earlier was 168/103 - in the past she was told to take an extra half of labetolol thus did so & her blood pressure came down to the 140s. She has had mild diarrhea, dry mouth, nausea, hot flashes, & hip pain. No CP, SOB, palpitations or cardiac symptoms. She was wondering if she should try tablets of an expired bottle of promethazine in 2012. I advised against this. She does not want to call her primary care doctor because she thinks he has been mean to her.  I advised she try to stay hydrated this evening (but not unreasonably so - she plans to try gatorade for her dry mouth). We talked about caregiver stress and she feels she is experiencing this significantly. I advised her to touch base with PCP during business hours about making a primary care appointment tomorrow; alternatively if she needs help scheduling with a different primary doc perhaps our office can help. If sx worsen or she just doesn't feel right, she is welcome to come to ER to be evaluated. She verbalized understanding and gratitude.

## 2012-03-28 ENCOUNTER — Telehealth: Payer: Self-pay | Admitting: Internal Medicine

## 2012-03-28 NOTE — Telephone Encounter (Signed)
Pt has been in xeralto since 02-17-12, having hip and leg pain, is this due to med [phone]

## 2012-03-28 NOTE — Telephone Encounter (Signed)
She is having pain in her legs and back.  She has been in the ER twice in the last month for pain in her hips and legs.  She is bent over double.  It has been going on for a while.  The pain has been going on since before she started the Xarelto.  She was in the ER before she started the Xarelto and was having hip and leg pain at that time.  She thinks she may be better taking Coumadin but does not want to have PCP follow the INR's and is concerned with the cost of co pays for cost of CVRR clinic.  She is concerned because her husband may have lung Ca.  He is following with Dr Sherene Sires.  She continues to ask if she should take the Xarelto.  I have explained to her that she needs to continue to take.  She also states that she has dry mouth and that Dr Virgel Manifold has given her Tramadol for her hip and leg pain.  She says that the insert for Tramadol says can cause dizziness and she will just go to the ER if she gets dizzy.  I explained to her that she should call Dr Vilinda Flake office before going to the ER if she has a problem with the medication they have given her.  She will do that

## 2012-04-01 ENCOUNTER — Institutional Professional Consult (permissible substitution): Payer: Medicare Other | Admitting: Internal Medicine

## 2012-04-02 ENCOUNTER — Telehealth: Payer: Self-pay | Admitting: Internal Medicine

## 2012-04-02 NOTE — Telephone Encounter (Signed)
She is on Prednisone and this is what is causing this her fast heart rate.  Dr Laurann Montana  is her PCP with [facility].  She was put on Prednisone for hip and leg pain for 9 days, also she had been unable to eat due to nausea.  Her husband is having his bronchoscopy tomorrow and she is anxious to get the results of that.  She is feeling better from hip and leg pain today.  She will continue all her medications as prescribed

## 2012-04-02 NOTE — Telephone Encounter (Signed)
Pt saw PCP last week with leg pain, was put on prednisone, feels some better, now having heart arrhythmias, should she stay on the med?

## 2012-04-08 ENCOUNTER — Telehealth: Payer: Self-pay | Admitting: Physician Assistant

## 2012-04-08 NOTE — Telephone Encounter (Signed)
Pt called because her BP/HR are higher than usual and she is concerned. Her heart rate suddenly jumped up and she checked her heart rate/blood pressure with a home blood pressure machine. Her heart rate was about 100 and her blood pressure between 150 and 170 systolic. The patient was concerned about this. She took her evening dose of labetalol a few minutes ago but it has not had time for full absorption. She was concerned and wanted to know what to do.  I discussed the situation with her. Other than feeling that her heart is going fast, she has no symptoms. She does not feel that her heart rate is irregular and does not feel that she is back in atrial fibrillation. She denies chest pain, shortness of breath, presyncope or any other symptoms.  She does not wish to come to the emergency room. I advised her that she could wait 2 hours after taking the labetalol for full absorption and then recheck her heart rate and blood pressure. If her heart rate and blood pressure remained elevated at greater than 170 systolic in greater than 110, she can take another labetalol tablet. If her heart rate and blood pressure are still elevated but not quite as high, she can take a half of the labetalol tablet. The office will contact her in the morning to check on her and schedule a followup appointment.

## 2012-04-09 ENCOUNTER — Telehealth: Payer: Self-pay | Admitting: Internal Medicine

## 2012-04-09 NOTE — Telephone Encounter (Signed)
Called patient and discussed with her   Her BP is 93/63 HR 80.  She feels like she is better this morning.  I asked her about her husband and his condition and the lung bx's are all negative.  She feels better about this, she is still very anxious about things in general  Will call back if she needs [place]

## 2012-04-09 NOTE — Telephone Encounter (Signed)
New problem:  Patient had appointment to see Dr. Graciela Husbands as a new patient 7/22 @ 4:00 pm . Patient was contacted by Frazier Butt C. On 7/19  To confirm her appointment with Dr. Graciela Husbands. Patient  stated she is unable to come due to sickness/will call back and reschedule. Patient called the on call service on 7/29 spoke with Bjorn Loser B PA.  - please see note  Please advise.

## 2012-04-10 ENCOUNTER — Emergency Department (HOSPITAL_COMMUNITY)
Admission: EM | Admit: 2012-04-10 | Discharge: 2012-04-10 | Disposition: A | Discharge status: Home / Self Care | Payer: Medicare Other | Attending: Emergency Medicine | Admitting: Emergency Medicine

## 2012-04-10 ENCOUNTER — Encounter (HOSPITAL_COMMUNITY): Payer: Self-pay | Admitting: *Deleted

## 2012-04-10 ENCOUNTER — Telehealth: Payer: Self-pay | Admitting: Internal Medicine

## 2012-04-10 ENCOUNTER — Other Ambulatory Visit: Payer: Self-pay

## 2012-04-10 DIAGNOSIS — E785 Hyperlipidemia, unspecified: Secondary | ICD-10-CM | POA: Insufficient documentation

## 2012-04-10 DIAGNOSIS — R002 Palpitations: Secondary | ICD-10-CM

## 2012-04-10 DIAGNOSIS — I10 Essential (primary) hypertension: Secondary | ICD-10-CM | POA: Insufficient documentation

## 2012-04-10 DIAGNOSIS — Z880 Allergy status to penicillin: Secondary | ICD-10-CM | POA: Insufficient documentation

## 2012-04-10 DIAGNOSIS — R197 Diarrhea, unspecified: Secondary | ICD-10-CM | POA: Insufficient documentation

## 2012-04-10 DIAGNOSIS — I491 Atrial premature depolarization: Secondary | ICD-10-CM

## 2012-04-10 DIAGNOSIS — R109 Unspecified abdominal pain: Secondary | ICD-10-CM | POA: Insufficient documentation

## 2012-04-10 DIAGNOSIS — I498 Other specified cardiac arrhythmias: Secondary | ICD-10-CM | POA: Insufficient documentation

## 2012-04-10 DIAGNOSIS — Z882 Allergy status to sulfonamides status: Secondary | ICD-10-CM | POA: Insufficient documentation

## 2012-04-10 DIAGNOSIS — Z8542 Personal history of malignant neoplasm of other parts of uterus: Secondary | ICD-10-CM | POA: Insufficient documentation

## 2012-04-10 DIAGNOSIS — M1990 Unspecified osteoarthritis, unspecified site: Secondary | ICD-10-CM | POA: Insufficient documentation

## 2012-04-10 LAB — URINALYSIS, ROUTINE W REFLEX MICROSCOPIC
Glucose, UA: NEGATIVE mg/dL
Protein, ur: NEGATIVE mg/dL
pH: 6.5 (ref 5.0–8.0)

## 2012-04-10 LAB — POCT I-STAT, CHEM 8
HCT: 39 % (ref 36.0–46.0)
Hemoglobin: 13.3 g/dL (ref 12.0–15.0)
Potassium: 3.6 mEq/L (ref 3.5–5.1)
Sodium: 141 mEq/L (ref 135–145)

## 2012-04-10 LAB — CBC WITH DIFFERENTIAL/PLATELET
Hemoglobin: 13.3 g/dL (ref 12.0–15.0)
Lymphocytes Relative: 18 % (ref 12–46)
Lymphs Abs: 1.8 10*3/uL (ref 0.7–4.0)
Monocytes Relative: 8 % (ref 3–12)
Neutro Abs: 7.1 10*3/uL (ref 1.7–7.7)
Neutrophils Relative %: 72 % (ref 43–77)
RBC: 4.26 MIL/uL (ref 3.87–5.11)

## 2012-04-10 LAB — URINE MICROSCOPIC-ADD ON

## 2012-04-10 NOTE — ED Provider Notes (Signed)
History     CSN: [id number]  Arrival date & time 04/10/12  1004   First MD Initiated Contact with Patient 04/10/12 1117      Chief Complaint  Patient presents with  . Irregular Heart Beat    (Consider location/radiation/quality/duration/timing/severity/associated sxs/prior treatment) The history is provided by the patient.   patient states that she has had episodes of her heart going slow and sometimes going faster. She does not feel lightheaded with it. No chest pain. No fevers. She states she checked her pulse and went down into the 40s. She also has had recent abdominal pain pelvic pain and diarrhea. She states that her right abdomen hurt but then she will have diarrhea oral bowel movement and the pain will go away. She states she had a CAT scan at [facility] did not show anything. She states she's also had x-rays do not show a cause.  Past Medical History  Diagnosis Date  . Hypertension   . Arthritis   . 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 Endometrial ca   . Anxiety   . HLD (hyperlipidemia)   . Cardiomegaly   . Pectus excavatum   . OA (osteoarthritis)     Past Surgical History  Procedure Date  . Cesarean section   . Laparoscopic hysterectomy 2011    Family History  Problem Relation Age of Onset  . Heart attack Father   . Heart disease Father     History  Substance Use Topics  . Smoking status: Never Smoker   . Smokeless tobacco: Not on file  . Alcohol Use: No    OB History    Grav Para Term Preterm Abortions TAB SAB Ect Mult Living                  Review of Systems  Constitutional: Negative for activity change and appetite change.  HENT: Negative for neck stiffness.   Eyes: Negative for pain.  Respiratory: Negative for chest tightness and shortness of breath.   Cardiovascular: Positive for palpitations. Negative for chest pain and  leg swelling.  Gastrointestinal: Positive for abdominal pain and diarrhea. Negative for nausea and vomiting.  Genitourinary: Negative for flank pain.  Musculoskeletal: Negative for back pain.  Skin: Negative for rash.  Neurological: Negative for weakness, numbness and headaches.  Psychiatric/Behavioral: Negative for behavioral problems.    Allergies  Carbamazepine; Codeine; Hydrochlorothiazide; Penicillins; and Sulfonamide derivatives  Home Medications   Current Outpatient Rx  Name Route Sig Dispense Refill  . ACETAMINOPHEN 500 MG PO TABS Oral Take 1,000 mg by mouth every 6 (six) hours as needed. For headache    . VITAMIN D 1000 UNITS PO TABS Oral Take 1,000 Units by mouth daily.    Marland Kitchen FELODIPINE ER 5 MG PO TB24 Oral Take 5 mg by mouth every evening.     . FUROSEMIDE 20 MG PO TABS Oral Take 20 mg by mouth daily.     Marland Kitchen LABETALOL HCL 200 MG PO TABS Oral Take 200 mg by mouth 2 (two) times daily.    Marland Kitchen MECLIZINE HCL 25 MG PO TABS Oral Take 25 mg by mouth 3 (three) times daily as needed. Dizziness    . POTASSIUM CHLORIDE 10 MEQ PO TBCR Oral Take 10 mEq by mouth daily.     Marland Kitchen RIVAROXABAN 20 MG PO TABS Oral Take 20 mg by mouth daily. 30 tablet  6  . TRAMADOL HCL 50 MG PO TABS Oral Take 50 mg by mouth every 6 (six) hours as needed. pain    . VALSARTAN 80 MG PO TABS Oral Take 80 mg by mouth 2 (two) times daily.     Marland Kitchen ZOLPIDEM TARTRATE 5 MG PO TABS Oral Take 5 mg by mouth at bedtime as needed. For sleep      BP 127/78  Pulse 85  Temp 97.8 F (36.6 C) (Oral)  Resp 18  SpO2 98%  Physical Exam  Nursing note and vitals reviewed. Constitutional: She is oriented to person, place, and time. She appears well-developed and well-nourished.  HENT:  Head: Normocephalic and atraumatic.  Eyes: EOM are normal. Pupils are equal, round, and reactive to light.  Neck: Normal range of motion. Neck supple.  Cardiovascular: Normal rate, regular rhythm and normal heart sounds.   No murmur heard.      Patient  will have episodes where she is in bigeminy on the monitor and her pulse will decrease to every other beat.  Pulmonary/Chest: Effort normal and breath sounds normal. No respiratory distress. She has no wheezes. She has no rales.  Abdominal: Soft. Bowel sounds are normal. She exhibits no distension. There is no tenderness. There is no rebound and no guarding.  Musculoskeletal: Normal range of motion.  Neurological: She is alert and oriented to person, place, and time. No cranial nerve deficit.  Skin: Skin is warm and dry.  Psychiatric: She has a normal mood and affect. Her speech is normal.    ED Course  Procedures (including critical care time)  Labs Reviewed  POCT I-STAT, CHEM 8 - Abnormal; Notable for the following:    Glucose, Bld 110 (*)     All other components within normal limits  CBC WITH DIFFERENTIAL  TROPONIN I  URINALYSIS, ROUTINE W REFLEX MICROSCOPIC   No results found.   No diagnosis found.   Date: 04/10/2012  Rate: 98  Rhythm: normal sinus rhythm  QRS Axis: left  Intervals: normal  ST/T Wave abnormalities: nonspecific ST/T changes  Conduction Disutrbances:none  Narrative Interpretation: new episodes of atrial begeminy  Old EKG Reviewed: unchanged    MDM  Patient with complaints of her heart sometimes when fasting time times going slow. Patient will have episodes of atrial bigeminy on her EKG and monitor. Laboratories overall reassuring. Also complaining of chronic abdominal pain and occasional diarrhea. She'll likely need GI followup for that. Patient will be seen by cardiology.         Juliet Rude. Rubin Payor, MD 04/10/12 [phone]

## 2012-04-10 NOTE — ED Notes (Signed)
Pt brought to room from triage; pt undressed, in gown, on monitor, continuous pulse oximetry and blood pressure cuff; EKG performed in triage by Jill Alexanders, EMT; family at bedside

## 2012-04-10 NOTE — Telephone Encounter (Signed)
Spoke with pt who reports she called EMS this AM because heart rate was 45 and blood pressure was 110/53. She was checked by EMS and given option of staying home or being transported to hospital because she was feeling better after being checked. Pt chose to stay home.  She now reports blood pressure of 113/55 and heart rate of 91.  She can't walk and legs "hurt terrible" but this is not new and pain is no worse than usual. Also states she has right sided abdominal pain but this is not new and is being evaluated. Pain has not increased recently. Pt reports she is feeling the same as when EMS left. I told pt I would forward to Dr. Ladona Ridgel and Tresa Endo for review. Pt then asked how long it would be before she received call back and when could she get in for appt with Dr. Graciela Husbands.  She then stated her heart was fluttering and she was having shortness of breath. She stated she was worried she may pass out.  I instructed pt to call 911 immediately. She is agreeable with this plan.

## 2012-04-10 NOTE — ED Notes (Signed)
CALL PLACED TO [facility] TO CHECK ON DELAY IN SEEING PATIENT. AWAIT CALL BACK

## 2012-04-10 NOTE — ED Notes (Addendum)
To ED for eval of heart rate. Pt states she has been taking her pulse and sometimes 'it's high and sometimes it's low'. No cp. Does c/o sob. resp e/u. Palpated pulse is regular and strong at rate of 80bpm

## 2012-04-10 NOTE — Consult Note (Signed)
CARDIOLOGY CONSULT NOTE   Patient ID: Tiffany Black MRN: [id number] DOB/AGE: February 25, 1934 78 y.o.  Admit date: 04/10/2012  Primary Physician   Tiffany Sauer, MD Primary Cardiologist :Tiffany Tiffany Black  Reason for Consultation   Palpitations Tiffany Black is a 78 y.o. female a history of PAF, HTN,cardiomegaly who presents with palpitations.She last called the office on 04/08/12 with c/o of elevated HR 100s and SBP 150-170 at which time she was asked to take extra labetalol. On 04/09/12 follow-up by office staff states she was okay without complaints. She called the office this morning with c/o low HR - 40s,  and inability to walk. While talking on the phone she was on the phone with the office today and reported that she called EMS due to low HR and inability to walk. When EMS got there she was better and she opted not to come to the hospital. Later, she called the office and was having fluttering in her chest and SOB. She came to the ER as advised. She has not had chest pain. She has PACs on telemetry but no malignant arrhythmias. No other symptoms currently but symptoms come and go.  Past Medical History  Diagnosis Date  . Hypertension   . Arthritis   . 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 Endometrial ca   . Anxiety   . HLD (hyperlipidemia)   . Cardiomegaly   . Pectus excavatum   . OA (osteoarthritis)      Past Surgical History  Procedure Date  . Cesarean section   . Laparoscopic hysterectomy 2011    Allergies  Allergen Reactions  . Carbamazepine Other (See Comments)    unknown  . Codeine Nausea And Vomiting  . Hydrochlorothiazide Other (See Comments)    unknown  . Penicillins Hives  . Sulfonamide Derivatives Hives   I have reviewed the patient's current medications Medication Sig  acetaminophen (TYLENOL) 500 MG tablet Take 1,000 mg by mouth every 6  (six) hours as needed. For headache  cholecalciferol (VITAMIN D) 1000 UNITS tablet Take 1,000 Units by mouth daily.  felodipine (PLENDIL) 5 MG 24 hr tablet Take 5 mg by mouth every evening.   furosemide (LASIX) 20 MG tablet Take 20 mg by mouth daily.   labetalol (NORMODYNE) 200 MG tablet Take 200 mg by mouth 2 (two) times daily.  meclizine (ANTIVERT) 25 MG tablet Take 25 mg by mouth 3 (three) times daily as needed. Dizziness  potassium chloride (KLOR-CON) 10 MEQ CR tablet Take 10 mEq by mouth daily.   Rivaroxaban (XARELTO) 20 MG TABS Take 20 mg by mouth daily.  traMADol (ULTRAM) 50 MG tablet Take 50 mg by mouth every 6 (six) hours as needed. pain  valsartan (DIOVAN) 80 MG tablet Take 80 mg by mouth 2 (two) times daily.   zolpidem (AMBIEN) 5 MG tablet Take 5 mg by mouth at bedtime as needed. For sleep   History   Social History  . Marital Status: Married    Spouse Name: N/A    Number of Children: N/A  . Years of Education: N/A   Occupational History  . Retired    Social History Main Topics  . Smoking status: Never Smoker   . Smokeless tobacco: Not on file  . Alcohol Use: No  . Drug Use: No  . Sexually Active: Yes   Other Topics Concern  . Not on file  Social History Narrative  . No narrative on file    Family Status  Relation Status Death Age  . Mother Deceased 60  . Father Deceased 102    MI   Family History  Problem Relation Age of Onset  . Heart attack Father   . Heart disease Father      ROS: See HPI. Full 14 point review of systems complete and found to be negative unless listed above.  Physical Exam: Blood pressure 163/94, pulse 80, temperature 97.8 F (36.6 C), temperature source Oral, resp. rate 20, SpO2 96.00%.  General: Well developed, well nourished, female in no acute distress Head: Eyes PERRLA, No xanthomas.   Normocephalic and atraumatic, oropharynx without edema or exudate. Dentition - OK Lungs: Clear bilaterally Heart:  Heart slightly irregular  rate and rhythm with S1, S2; no clinically significant murmur. pulses are 2+ all 4 extrem.   Neck: No carotid bruits. No lymphadenopathy.  JVD not elevated. Abdomen: Bowel sounds present, abdomen soft and non-tender without masses or hernias noted. Msk:  No spine or cva tenderness. No weakness, no joint deformities or effusions. Extremities: No clubbing or cyanosis. No edema.  Neuro: Alert and oriented X 3. No focal deficits noted. Psych:  Good affect, responds appropriately Skin: No rashes or lesions noted.  Labs:   Lab Results  Component Value Date   WBC 9.9 04/10/2012   HGB 13.3 04/10/2012   HCT 39.2 04/10/2012   MCV 92.0 04/10/2012   PLT 204 04/10/2012     Lab 04/10/12 1123  NA 141  K 3.6  CL 103  CO2 --  BUN 12  CREATININE 0.90  CALCIUM --  PROT --  BILITOT --  ALKPHOS --  ALT --  AST --  GLUCOSE 110*    Basename 04/10/12 1204  CKTOTAL --  CKMB --  TROPONINI <0.30   ECG:  10-Apr-2012 10:41:29  Sinus rhythm with Premature atrial complexes with Abberant conduction Left axis deviation Minimal voltage criteria for LVH, may be normal variant Nonspecific ST and T wave abnormality Abnormal ECG Vent. rate 98 BPM PR interval 148 ms QRS duration 86 ms QT/QTc 358/457 ms P-R-T axes 78 -39 -7  ASSESSMENT AND PLAN:   The patient was seen today by Tiffany Black, the patient evaluated and the data reviewed.  Palpitations - no malignant arrhythmia on telemetry. Continue same Rx and f/u in office.  Signed: Theodore Black 04/10/2012, 4:12 PM Co-Sign MD Patient seen and examined and history reviewed. Agree with above findings and plan. Partient has been monitored for several hours while in the ED with findings of NSR with frequent PACs. No afib seen. Patient does not use caffeine or decongestants. She is on appropriate doses of beta blockers. I have tried to reassure her but she is quite anxious. She was previously scheduled to see Tiffany. Graciela Black this week but had to cancel due to  husband's illness. Will reschedule appt. OK to discharge home today. Continue Xarelto at current dose.   [facility] 04/10/2012 5:58 PM

## 2012-04-10 NOTE — ED Notes (Signed)
Pt husband continiues in and out of room  To nurses station.in to discharge pt to home. Pt given fluids and [place] sandwich at her request

## 2012-04-10 NOTE — ED Notes (Signed)
EXPLAINED TO PT AGAIN THAT WE CANNOT GIVE HER ANYTHING BY MOUTH UNTIL Tiffany GIVES THE GO AHEAD. THEY ARE AWARE THAT A CALL HAS BEEN PLACED TO THE DOCTOR AGAIN

## 2012-04-10 NOTE — Telephone Encounter (Signed)
Pt having b/p issues and can't walk ems came at 715 but she was doing good while they where there so she didn't go to hospital and now she is feeling bad again.

## 2012-04-10 NOTE — ED Notes (Signed)
[name] HAS ARRIVED TO SEE PT

## 2012-04-10 NOTE — ED Provider Notes (Signed)
3:30 PM Assumed care of patient in the CDU from [facility] [place].  Patient presented for evaluation of irregular heart rhythm.  Patient found to have runs of bigeminy.   Plan is for [facility] to come evaluate patient.  Disposition pending on Cardiology evaluation.  Reassessed patient.  Patient is in no acute distress.  Alert and orientated x 3, Heart RRR, Lungs CTAB, Abdomen soft and nontender, no LE edema, Distal pulses intact.  5:46 PM [facility] has seen patient and feels that she can be discharged home.  They recommend having the patient continue current medications and follow up in the Cardiology office.  Pascal Lux [place], PA-C 04/11/12 2243

## 2012-04-10 NOTE — ED Provider Notes (Signed)
2:03 PM Patient signed out to me by Dr Rubin Payor.  Pt to move to CDU holding for [facility] cardiology to consult.  Pt with multiple complaints in ED today including palpitations and recent abdominal pain, diarrhea.  Pt with intermittent bigeminy on the monitor.  Abdominal pain/diarrhea thought to be benign, seen previously by [facility] with negative CT scan.  To be seen by cardiology in ED.  Disposition pending consult.    3:02 PM Patient reports she continues to have intermittent palpitations.  Patient is A&Ox4, NAD, RRR, no m/r/g, chest nontender, lungs CTAB, abd soft, NT, extremities without edema, distal pulses intact.   3:28 PM Discussed patient with Magnus Sinning, PA-C, who assumes care of patient at change of shift.  Plan is for cardiology consult, dispo pending consult.    Results for orders placed during the hospital encounter of 04/10/12  POCT I-STAT, CHEM 8      Component Value Range   Sodium 141  135 - 145 mEq/L   Potassium 3.6  3.5 - 5.1 mEq/L   Chloride 103  96 - 112 mEq/L   BUN 12  6 - 23 mg/dL   Creatinine, Ser 1.61  0.50 - 1.10 mg/dL   Glucose, Bld 096 (*) 70 - 99 mg/dL   Calcium, Ion 0.45  4.09 - 1.30 mmol/L   TCO2 25  0 - 100 mmol/L   Hemoglobin 13.3  12.0 - 15.0 g/dL   HCT 81.1  91.4 - 78.2 %  CBC WITH DIFFERENTIAL      Component Value Range   WBC 9.9  4.0 - 10.5 K/uL   RBC 4.26  3.87 - 5.11 MIL/uL   Hemoglobin 13.3  12.0 - 15.0 g/dL   HCT 95.6  21.3 - 08.6 %   MCV 92.0  78.0 - 100.0 fL   MCH 31.2  26.0 - 34.0 pg   MCHC 33.9  30.0 - 36.0 g/dL   RDW 57.8  46.9 - 62.9 %   Platelets 204  150 - 400 K/uL   Neutrophils Relative 72  43 - 77 %   Neutro Abs 7.1  1.7 - 7.7 K/uL   Lymphocytes Relative 18  12 - 46 %   Lymphs Abs 1.8  0.7 - 4.0 K/uL   Monocytes Relative 8  3 - 12 %   Monocytes Absolute 0.8  0.1 - 1.0 K/uL   Eosinophils Relative 1  0 - 5 %   Eosinophils Absolute 0.1  0.0 - 0.7 K/uL   Basophils Relative 0  0 - 1 %   Basophils Absolute 0.0  0.0 -  0.1 K/uL  TROPONIN I      Component Value Range   Troponin I <0.30  <0.30 ng/mL  URINALYSIS, ROUTINE W REFLEX MICROSCOPIC      Component Value Range   Color, Urine YELLOW  YELLOW   APPearance HAZY (*) CLEAR   Specific Gravity, Urine 1.020  1.005 - 1.030   pH 6.5  5.0 - 8.0   Glucose, UA NEGATIVE  NEGATIVE mg/dL   Hgb urine dipstick NEGATIVE  NEGATIVE   Bilirubin Urine NEGATIVE  NEGATIVE   Ketones, ur NEGATIVE  NEGATIVE mg/dL   Protein, ur NEGATIVE  NEGATIVE mg/dL   Urobilinogen, UA 0.2  0.0 - 1.0 mg/dL   Nitrite NEGATIVE  NEGATIVE   Leukocytes, UA MODERATE (*) NEGATIVE  URINE MICROSCOPIC-ADD ON      Component Value Range   Squamous Epithelial / LPF RARE  RARE   WBC, UA  0-2  <3 WBC/hpf   RBC / HPF 0-2  <3 RBC/hpf   Bacteria, UA FEW (*) RARE   Urine-Other LESS THAN 10 mL OF URINE SUBMITTED     No results found.     [place], [place] 04/10/12 1530

## 2012-04-12 NOTE — ED Provider Notes (Signed)
Medical screening examination/treatment/procedure(s) were performed by non-physician practitioner and as supervising physician I was immediately available for consultation/collaboration.  Cheri Guppy, MD 04/12/12 [phone]

## 2012-04-12 NOTE — ED Provider Notes (Signed)
Medical screening examination/treatment/procedure(s) were conducted as a shared visit with non-physician practitioner(s) and myself.  I personally evaluated the patient during the encounter  Tiffany Black. Rubin Payor, MD 04/12/12 1051

## 2012-05-08 ENCOUNTER — Ambulatory Visit (INDEPENDENT_AMBULATORY_CARE_PROVIDER_SITE_OTHER): Payer: Medicare Other | Admitting: Cardiology

## 2012-05-08 ENCOUNTER — Encounter: Payer: Self-pay | Admitting: Cardiology

## 2012-05-08 VITALS — BP 149/85 | HR 71 | Ht 64.0 in | Wt 169.0 lb

## 2012-05-08 DIAGNOSIS — I4891 Unspecified atrial fibrillation: Secondary | ICD-10-CM

## 2012-05-08 DIAGNOSIS — I10 Essential (primary) hypertension: Secondary | ICD-10-CM

## 2012-05-08 DIAGNOSIS — R5383 Other fatigue: Secondary | ICD-10-CM

## 2012-05-08 NOTE — Patient Instructions (Signed)
We will have you wear an event monitor and schedule you for an Echocardiogram..  We will set up an appointment with Dr. Graciela Husbands.

## 2012-05-08 NOTE — Progress Notes (Signed)
Tiffany Black Date of Birth: 1934-02-23 Medical Record #[id number]  History of Present Illness: Tiffany Black is seen today for followup of paroxysmal atrial fibrillation. She had previously seen Dr. Ladona Black in June. She has been on chronic beta blocker therapy and has been on Xarelto since that time for anticoagulation. She complains that she didn't have a good relationship with Dr. Ladona Black and wanted to see Dr. Graciela Black. She actually had an appointment to see Dr. Graciela Black but because of issues surrounding her husband's lung cancer she had to cancel. I saw her on one occasion in the emergency department with symptoms of palpitations. Evaluation at that time was unremarkable except for occasional PACs. She returns today. She has been under a lot of stress. Her husband is going to start chemotherapy and radiation therapy this week for lung cancer. She denies any real significant palpitations but has had periods where she felt extremely exhausted to the point where she felt her speech was slurred. After she took a nap this improved. She denies any syncope.she denies any significant chest pain or shortness of breath.  Current Outpatient Prescriptions on File Prior to Visit  Medication Sig Dispense Refill  . cholecalciferol (VITAMIN D) 1000 UNITS tablet Take 1,000 Units by mouth daily.      . felodipine (PLENDIL) 5 MG 24 hr tablet Take 5 mg by mouth every evening.       . furosemide (LASIX) 20 MG tablet Take 20 mg by mouth daily.       Marland Kitchen labetalol (NORMODYNE) 200 MG tablet Take 200 mg by mouth 2 (two) times daily.      . meclizine (ANTIVERT) 25 MG tablet Take 25 mg by mouth 3 (three) times daily as needed. Dizziness      . potassium chloride (KLOR-CON) 10 MEQ CR tablet Take 10 mEq by mouth daily.       . Rivaroxaban (XARELTO) 20 MG TABS Take 20 mg by mouth daily.  30 tablet  6  . traMADol (ULTRAM) 50 MG tablet Take 50 mg by mouth every 6 (six) hours as needed. pain      . valsartan (DIOVAN) 80 MG tablet Take  80 mg by mouth 2 (two) times daily.       Marland Kitchen zolpidem (AMBIEN) 5 MG tablet Take 5 mg by mouth at bedtime as needed. For sleep        Allergies  Allergen Reactions  . Carbamazepine Other (See Comments)    unknown  . Codeine Nausea And Vomiting  . Hydrochlorothiazide Other (See Comments)    unknown  . Penicillins Hives  . Sulfonamide Derivatives Hives    Past Medical History  Diagnosis Date  . Hypertension   . Arthritis   . 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 Endometrial ca   . Anxiety   . HLD (hyperlipidemia)   . Cardiomegaly   . Pectus excavatum   . OA (osteoarthritis)     Past Surgical History  Procedure Date  . Cesarean section   . Laparoscopic hysterectomy 2011    History  Smoking status  . Never Smoker   Smokeless tobacco  . Not on file    History  Alcohol Use No    Family History  Problem Relation Age of Onset  . Heart attack Father   . Heart disease Father     Review of Systems: As noted in  history of present illness.  All other systems were reviewed and are negative.  Physical Exam: BP 149/85  Pulse 71  Ht 5\' 4"  (1.626 m)  Wt 169 lb (76.658 kg)  BMI 29.01 kg/m2 She is an anxious white female in no acute distress.The patient is alert and oriented x 3.  The mood and affect are normal.  The skin is warm and dry.  Color is normal.  The HEENT exam reveals that the sclera are nonicteric.  The mucous membranes are moist.  The carotids are 2+ without bruits.  There is no thyromegaly.  There is no JVD.  The lungs are clear.  The chest wall is non tender.  The heart exam reveals a regular rate with a normal S1 and S2.  There are no murmurs, gallops, or rubs.  The PMI is not displaced.   Abdominal exam reveals good bowel sounds.  There is no guarding or rebound.  There is no hepatosplenomegaly or tenderness.  There are no masses.  Exam of  the legs reveal no clubbing, cyanosis, or edema.  The legs are without rashes.  The distal pulses are intact.  Cranial nerves II - XII are intact.  Motor and sensory functions are intact.  The gait is normal.  LABORATORY DATA: ECG today demonstrates normal sinus rhythm with left axis deviation and LVH. She has chronic ST-T wave changes in the inferolateral leads.  Assessment / Plan: 1. Paroxysmal atrial fibrillation. She has symptoms of marked fatigue that times and some vague palpitations. I recommended an event monitor to see if she's having breakthrough arrhythmia or significant bradycardia. We will also update her echocardiogram. We will arrange followup with Dr. Graciela Black in his next availability.  2. Chronic anticoagulation.  3. Situational stress.

## 2012-05-09 DIAGNOSIS — I4891 Unspecified atrial fibrillation: Secondary | ICD-10-CM

## 2012-05-15 ENCOUNTER — Telehealth: Payer: Self-pay | Admitting: Cardiology

## 2012-05-15 NOTE — Telephone Encounter (Signed)
Error

## 2012-05-20 ENCOUNTER — Other Ambulatory Visit (HOSPITAL_COMMUNITY): Payer: Medicare Other

## 2012-05-21 ENCOUNTER — Other Ambulatory Visit (HOSPITAL_COMMUNITY): Payer: Medicare Other

## 2012-05-29 ENCOUNTER — Telehealth: Payer: Self-pay | Admitting: Cardiology

## 2012-05-29 NOTE — Telephone Encounter (Signed)
Pt wants to know how long to wear e cardio monitor, nurse said 3 weeks dr said 4 weeks, when she called was told 9-17, when she called the company they said 9-27, pls call  [phone]

## 2012-05-29 NOTE — Telephone Encounter (Signed)
Spoke to patient was told to mail monitor back to [employer] 06/08/12.

## 2012-06-19 ENCOUNTER — Encounter: Payer: Self-pay | Admitting: Cardiology

## 2012-07-03 ENCOUNTER — Encounter: Payer: Self-pay | Admitting: Internal Medicine

## 2012-07-03 ENCOUNTER — Ambulatory Visit (HOSPITAL_COMMUNITY): Payer: Medicare Other | Attending: Cardiovascular Disease

## 2012-07-03 ENCOUNTER — Ambulatory Visit (INDEPENDENT_AMBULATORY_CARE_PROVIDER_SITE_OTHER): Payer: Medicare Other | Admitting: Internal Medicine

## 2012-07-03 VITALS — BP 147/85 | HR 72 | Ht 65.0 in | Wt 169.6 lb

## 2012-07-03 DIAGNOSIS — I4891 Unspecified atrial fibrillation: Secondary | ICD-10-CM

## 2012-07-03 DIAGNOSIS — I10 Essential (primary) hypertension: Secondary | ICD-10-CM | POA: Insufficient documentation

## 2012-07-03 DIAGNOSIS — I059 Rheumatic mitral valve disease, unspecified: Secondary | ICD-10-CM | POA: Insufficient documentation

## 2012-07-03 DIAGNOSIS — I369 Nonrheumatic tricuspid valve disorder, unspecified: Secondary | ICD-10-CM | POA: Insufficient documentation

## 2012-07-03 DIAGNOSIS — I379 Nonrheumatic pulmonary valve disorder, unspecified: Secondary | ICD-10-CM | POA: Insufficient documentation

## 2012-07-03 DIAGNOSIS — R5383 Other fatigue: Secondary | ICD-10-CM

## 2012-07-03 NOTE — Progress Notes (Signed)
Echocardiogram performed.  

## 2012-07-03 NOTE — Patient Instructions (Addendum)
Your physician recommends that you schedule a follow-up appointment in: 3 months with Dr Klein  

## 2012-07-03 NOTE — Assessment & Plan Note (Signed)
The patient carries a diagnosis of atrial fibrillation apparently diagnosed at [facility]. She has frequent palpitations which he thinks her atrial fibrillation; we will await the event recorder.  She has a CHADS-VASc score of 4 AND IS on XARELTO to which she is tolerating well. Function was normal in July

## 2012-07-03 NOTE — Progress Notes (Signed)
sksw    History and Physical  Patient ID: Tiffany Black MRN: [id number], SOB: 1934-08-19 78 y.o. Date of Encounter: 07/03/2012, 2:04 PM  Primary Physician: Hoyle Sauer, MD Primary Cardiologist: Helane Rima Electrophysiologist:  sk  Chief Complaint: AFIB  History of Present Illness: Tiffany Black is a 78 y.o. female  seen at the request of Dr. [place] for atrial fibrillation. She has seen Dr. Ladona Ridgel in the past; that connection was not great and she asked to see [place] instead   She had atrial fibrillation apparently documented at [facility] with Dr. Sharol Harness. She has frequent palpitations and has just finished wearing an event recorder results of which are not yet available. She had an echocardiogram today.  She denies chest pain exercise intolerance or shortness of breath. There is no nocturnal dyspnea peripheral edema or orthopnea.  She is undergoing a great deal of stress as her husband has lung cancer and is currently hospitalized with radiation esophagitis   Thromboembolic risk factors are notable for age, hypertension, and gender for a CHADS-VASc score of 4  Past Medical History  Diagnosis Date  . Hypertension   . Arthritis   . 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 Endometrial ca   . Anxiety   . HLD (hyperlipidemia)   . Cardiomegaly   . Pectus excavatum   . OA (osteoarthritis)      Past Surgical History  Procedure Date  . Cesarean section   . Laparoscopic hysterectomy 2011      Current Outpatient Prescriptions  Medication Sig Dispense Refill  . cholecalciferol (VITAMIN D) 1000 UNITS tablet Take 1,000 Units by mouth daily.      . felodipine (PLENDIL) 5 MG 24 hr tablet Take 5 mg by mouth every evening.       . furosemide (LASIX) 20 MG tablet Take 20 mg by mouth daily.       Marland Kitchen labetalol (NORMODYNE) 200 MG tablet Take 200 mg by mouth 2  (two) times daily.      . meclizine (ANTIVERT) 25 MG tablet Take 25 mg by mouth 3 (three) times daily as needed. Dizziness      . potassium chloride (KLOR-CON) 10 MEQ CR tablet Take 10 mEq by mouth daily.       . Rivaroxaban (XARELTO) 20 MG TABS Take 20 mg by mouth daily.  30 tablet  6  . traMADol (ULTRAM) 50 MG tablet Take 50 mg by mouth every 6 (six) hours as needed. pain      . valsartan (DIOVAN) 80 MG tablet Take 80 mg by mouth 2 (two) times daily.       Marland Kitchen zolpidem (AMBIEN) 5 MG tablet Take 5 mg by mouth at bedtime as needed. For sleep         Allergies: Allergies  Allergen Reactions  . Carbamazepine Other (See Comments)    unknown  . Codeine Nausea And Vomiting  . Hydrochlorothiazide Other (See Comments)    unknown  . Penicillins Hives  . Sulfonamide Derivatives Hives     History  Substance Use Topics  . Smoking status: Never Smoker   . Smokeless tobacco: Not on file  . Alcohol Use: No      Family History  Problem Relation Age of Onset  . Heart attack Father   . Heart disease Father       ROS:  Please see the history of  present illness.     All other systems reviewed and negative.   Vital Signs: There were no vitals taken for this visit.  PHYSICAL EXAM: General:  Well nourished, well developed female in no acute distress  HEENT: normal Lymph: no adenopathy Neck: no JVD Endocrine:  No thryomegaly Vascular: No carotid bruits; FA pulses 2+ bilaterally without bruits Cardiac:  normal S1, S2; RRR; no murmur Back: without kyphosis/scoliosis, no CVA tenderness Lungs:  clear to auscultation bilaterally, no wheezing, rhonchi or rales Abd: soft, nontender, no hepatomegaly Ext: no edema Musculoskeletal:  No deformities, BUE and BLE strength normal and equal Skin: warm and dry Neuro:  CNs 2-12 intact, no focal abnormalities noted Psych:  Normal affect   EKG:   Sinus rhythm at 70 Intervals 16/09/38 ST-T changes associated with repolarization and evidence of  ventricular hypertrophy Labs:   Lab Results  Component Value Date   WBC 9.9 04/10/2012   HGB 13.3 04/10/2012   HCT 39.2 04/10/2012   MCV 92.0 04/10/2012   PLT 204 04/10/2012   No results found for this basename: NA,K,CL,CO2,BUN,CREATININE,CALCIUM,LABALBU,PROT,BILITOT,ALKPHOS,ALT,AST,GLUCOSE in the last 168 hours No results found for this basename: CKTOTAL:4,CKMB:4,TROPONINI:4 in the last 72 hours No results found for this basename: CHOL, HDL, LDLCALC, TRIG   No results found for this basename: DDIMER   BNP No results found for this basename: probnp       ASSESSMENT AND PLAN:

## 2012-07-03 NOTE — Assessment & Plan Note (Signed)
She apparently has had very difficult to control hypertension. She is followed in the hypertension clinic at [facility]. She is on ACE inhibitors and ARB. I will defer this to them.

## 2012-09-23 ENCOUNTER — Other Ambulatory Visit: Payer: Self-pay | Admitting: Internal Medicine

## 2012-09-23 DIAGNOSIS — I480 Paroxysmal atrial fibrillation: Secondary | ICD-10-CM

## 2012-09-23 MED ORDER — RIVAROXABAN 20 MG PO TABS: 20.0000 mg | ORAL_TABLET | Freq: Every day | ORAL | Status: DC

## 2012-10-07 ENCOUNTER — Ambulatory Visit: Payer: Medicare Other | Admitting: Internal Medicine

## 2012-10-26 ENCOUNTER — Emergency Department (HOSPITAL_COMMUNITY): Payer: Medicare Other

## 2012-10-26 ENCOUNTER — Emergency Department (HOSPITAL_COMMUNITY)
Admission: EM | Admit: 2012-10-26 | Discharge: 2012-10-26 | Disposition: A | Discharge status: Home / Self Care | Payer: Medicare Other | Attending: Emergency Medicine | Admitting: Emergency Medicine

## 2012-10-26 ENCOUNTER — Encounter (HOSPITAL_COMMUNITY): Payer: Self-pay | Admitting: *Deleted

## 2012-10-26 DIAGNOSIS — Z8542 Personal history of malignant neoplasm of other parts of uterus: Secondary | ICD-10-CM | POA: Insufficient documentation

## 2012-10-26 DIAGNOSIS — N390 Urinary tract infection, site not specified: Secondary | ICD-10-CM | POA: Insufficient documentation

## 2012-10-26 DIAGNOSIS — IMO0002 Reserved for concepts with insufficient information to code with codable children: Secondary | ICD-10-CM | POA: Insufficient documentation

## 2012-10-26 DIAGNOSIS — Y929 Unspecified place or not applicable: Secondary | ICD-10-CM | POA: Insufficient documentation

## 2012-10-26 DIAGNOSIS — E785 Hyperlipidemia, unspecified: Secondary | ICD-10-CM | POA: Insufficient documentation

## 2012-10-26 DIAGNOSIS — Z8739 Personal history of other diseases of the musculoskeletal system and connective tissue: Secondary | ICD-10-CM | POA: Insufficient documentation

## 2012-10-26 DIAGNOSIS — W1809XA Striking against other object with subsequent fall, initial encounter: Secondary | ICD-10-CM | POA: Insufficient documentation

## 2012-10-26 DIAGNOSIS — F411 Generalized anxiety disorder: Secondary | ICD-10-CM | POA: Insufficient documentation

## 2012-10-26 DIAGNOSIS — I10 Essential (primary) hypertension: Secondary | ICD-10-CM | POA: Insufficient documentation

## 2012-10-26 DIAGNOSIS — Z8679 Personal history of other diseases of the circulatory system: Secondary | ICD-10-CM | POA: Insufficient documentation

## 2012-10-26 DIAGNOSIS — I4891 Unspecified atrial fibrillation: Secondary | ICD-10-CM | POA: Insufficient documentation

## 2012-10-26 DIAGNOSIS — W19XXXA Unspecified fall, initial encounter: Secondary | ICD-10-CM

## 2012-10-26 DIAGNOSIS — R296 Repeated falls: Secondary | ICD-10-CM | POA: Insufficient documentation

## 2012-10-26 DIAGNOSIS — M1990 Unspecified osteoarthritis, unspecified site: Secondary | ICD-10-CM | POA: Insufficient documentation

## 2012-10-26 DIAGNOSIS — Z79899 Other long term (current) drug therapy: Secondary | ICD-10-CM | POA: Insufficient documentation

## 2012-10-26 DIAGNOSIS — Y9301 Activity, walking, marching and hiking: Secondary | ICD-10-CM | POA: Insufficient documentation

## 2012-10-26 LAB — GLUCOSE, CAPILLARY: Glucose-Capillary: 97 mg/dL (ref 70–99)

## 2012-10-26 LAB — URINALYSIS, ROUTINE W REFLEX MICROSCOPIC
Bilirubin Urine: NEGATIVE
Ketones, ur: NEGATIVE mg/dL
Nitrite: NEGATIVE
Protein, ur: NEGATIVE mg/dL
pH: 5.5 (ref 5.0–8.0)

## 2012-10-26 LAB — CBC WITH DIFFERENTIAL/PLATELET
Basophils Absolute: 0 10*3/uL (ref 0.0–0.1)
Eosinophils Absolute: 0.1 10*3/uL (ref 0.0–0.7)
Eosinophils Relative: 2 % (ref 0–5)
HCT: 40.6 % (ref 36.0–46.0)
MCH: 30.3 pg (ref 26.0–34.0)
MCHC: 33.3 g/dL (ref 30.0–36.0)
MCV: 91.2 fL (ref 78.0–100.0)
Monocytes Absolute: 0.5 10*3/uL (ref 0.1–1.0)
Platelets: 194 10*3/uL (ref 150–400)
RDW: 13.1 % (ref 11.5–15.5)

## 2012-10-26 LAB — URINE MICROSCOPIC-ADD ON

## 2012-10-26 MED ORDER — CIPROFLOXACIN HCL 500 MG PO TABS: 500.0000 mg | ORAL_TABLET | Freq: Two times a day (BID) | ORAL | Status: DC

## 2012-10-26 MED ORDER — ACETAMINOPHEN 325 MG PO TABS
650.0000 mg | ORAL_TABLET | Freq: Once | ORAL | Status: DC
  Filled 2012-10-26: qty 2

## 2012-10-26 MED ORDER — ACETAMINOPHEN 500 MG PO TABS: 500.0000 mg | ORAL_TABLET | Freq: Four times a day (QID) | ORAL | Status: DC | PRN

## 2012-10-26 NOTE — ED Provider Notes (Signed)
History     CSN: [id number]  Arrival date & time 10/26/12  1419   First MD Initiated Contact with Patient 10/26/12 1532      Chief Complaint  Patient presents with  . Fall  . Back Pain    (Consider location/radiation/quality/duration/timing/severity/associated sxs/prior treatment) HPI  79 year old female with significant history of atrial fibrillation on Xarelto, cardiomegaly, and hypertension presents for evaluations of a fall. Patient reports while walking this morning her body gave out, causing for her to fell backward. States she was able to call herself on the sofa while falling and hits her bottom against carpet ground.  She complained of pain to the right buttock and low back, and sharp and throbbing, nonradiating, worsening when she goes from a sitting position to a standing position. She denies hitting head or loss of consciousness. Denies any pre-precipitating symptoms prior to the fall. Denies lightheadedness, dizziness, chest pain, shortness of breath, numbness or weakness. No urinary complaints. Denies any heart palpitation. She reports performing some snow shoveling yesterday but unsure if it was related to her fall.  Past Medical History  Diagnosis Date  . Hypertension   . Arthritis   . 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 Endometrial ca   . Anxiety   . HLD (hyperlipidemia)   . Cardiomegaly   . Pectus excavatum   . OA (osteoarthritis)     Past Surgical History  Procedure Laterality Date  . Cesarean section    . Laparoscopic hysterectomy  2011    Family History  Problem Relation Age of Onset  . Heart attack Father   . Heart disease Father     History  Substance Use Topics  . Smoking status: Never Smoker   . Smokeless tobacco: Not on file  . Alcohol Use: No    OB History   Grav Para Term Preterm Abortions TAB SAB Ect Mult Living                   Review of Systems  Constitutional:       10 Systems reviewed and all are negative for acute change except as noted in the HPI.     Allergies  Carbamazepine; Codeine; Hydrochlorothiazide; Penicillins; and Sulfonamide derivatives  Home Medications   Current Outpatient Rx  Name  Route  Sig  Dispense  Refill  . cholecalciferol (VITAMIN D) 1000 UNITS tablet   Oral   Take 1,000 Units by mouth daily.         . felodipine (PLENDIL) 5 MG 24 hr tablet   Oral   Take 5 mg by mouth 2 (two) times daily.          . furosemide (LASIX) 20 MG tablet   Oral   Take 20 mg by mouth daily.          Marland Kitchen labetalol (NORMODYNE) 200 MG tablet   Oral   Take 200 mg by mouth 2 (two) times daily.         . meclizine (ANTIVERT) 25 MG tablet   Oral   Take 25 mg by mouth 3 (three) times daily as needed. Dizziness         . potassium chloride (KLOR-CON) 10 MEQ CR tablet   Oral   Take 10 mEq by mouth daily.          . Rivaroxaban (XARELTO) 20 MG TABS  Oral   Take 1 tablet (20 mg total) by mouth daily.   30 tablet   6   . valsartan (DIOVAN) 80 MG tablet   Oral   Take 80 mg by mouth 2 (two) times daily.          Marland Kitchen zolpidem (AMBIEN) 5 MG tablet   Oral   Take 5 mg by mouth at bedtime as needed. For sleep           BP 163/84  Pulse 72  Temp(Src) 98.3 F (36.8 C) (Oral)  Resp 18  SpO2 98%  Physical Exam  Nursing note and vitals reviewed. Constitutional: She appears well-developed and well-nourished. No distress.  Awake, alert, nontoxic appearance  HENT:  Head: Atraumatic.  Eyes: Conjunctivae are normal. Right eye exhibits no discharge. Left eye exhibits no discharge.  Neck: Neck supple.  Cardiovascular: Normal rate and regular rhythm.   Pulmonary/Chest: Effort normal. No respiratory distress. She exhibits no tenderness.  Abdominal: Soft. There is no tenderness. There is no rebound.  Musculoskeletal: She exhibits tenderness (mild tenderness  lumbosacral  region favoring the right side without any significant midline spine tenderness, step-off, crepitus, or overlying skin changes. Normal hip flexion and extension bilaterally. 5 out of 5 strength to all 4 extremities ). She exhibits no edema.  ROM appears intact, no obvious focal weakness  Neurological:  Mental status and motor strength appears intact  Skin: No rash noted.  Psychiatric: She has a normal mood and affect.    ED Course  Procedures (including critical care time)   Date: 10/26/2012  Rate: 63  Rhythm: normal sinus rhythm  QRS Axis: left  Intervals: normal  ST/T Wave abnormalities: normal  Conduction Disutrbances:left anterior fascicular block  Narrative Interpretation:   Old EKG Reviewed: no acute changes noted  Results for orders placed during the hospital encounter of 10/26/12  CBC WITH DIFFERENTIAL      Result Value Range   WBC 8.2  4.0 - 10.5 K/uL   RBC 4.45  3.87 - 5.11 MIL/uL   Hemoglobin 13.5  12.0 - 15.0 g/dL   HCT 16.1  09.6 - 04.5 %   MCV 91.2  78.0 - 100.0 fL   MCH 30.3  26.0 - 34.0 pg   MCHC 33.3  30.0 - 36.0 g/dL   RDW 40.9  81.1 - 91.4 %   Platelets 194  150 - 400 K/uL   Neutrophils Relative 76  43 - 77 %   Neutro Abs 6.2  1.7 - 7.7 K/uL   Lymphocytes Relative 16  12 - 46 %   Lymphs Abs 1.3  0.7 - 4.0 K/uL   Monocytes Relative 6  3 - 12 %   Monocytes Absolute 0.5  0.1 - 1.0 K/uL   Eosinophils Relative 2  0 - 5 %   Eosinophils Absolute 0.1  0.0 - 0.7 K/uL   Basophils Relative 0  0 - 1 %   Basophils Absolute 0.0  0.0 - 0.1 K/uL  URINALYSIS, ROUTINE W REFLEX MICROSCOPIC      Result Value Range   Color, Urine YELLOW  YELLOW   APPearance TURBID (*) CLEAR   Specific Gravity, Urine 1.017  1.005 - 1.030   pH 5.5  5.0 - 8.0   Glucose, UA NEGATIVE  NEGATIVE mg/dL   Hgb urine dipstick NEGATIVE  NEGATIVE   Bilirubin Urine NEGATIVE  NEGATIVE   Ketones, ur NEGATIVE  NEGATIVE mg/dL   Protein, ur NEGATIVE  NEGATIVE mg/dL   Urobilinogen, UA  0.2  0.0 - 1.0  mg/dL   Nitrite NEGATIVE  NEGATIVE   Leukocytes, UA LARGE (*) NEGATIVE  GLUCOSE, CAPILLARY      Result Value Range   Glucose-Capillary 97  70 - 99 mg/dL  URINE MICROSCOPIC-ADD ON      Result Value Range   Squamous Epithelial / LPF MANY (*) RARE   WBC, UA 11-20  <3 WBC/hpf   Bacteria, UA MANY (*) RARE   Dg Lumbar Spine Complete  10/26/2012  *RADIOLOGY REPORT*  Clinical Data: Fall, low back pain.  LUMBAR SPINE - COMPLETE 4+ VIEW  Comparison: 01/30/2012  Findings: Mild levoscoliosis.  Diffuse degenerative disc and facet disease, similar to prior study.  No fracture or acute subluxation. SI joints are symmetric and unremarkable.  IMPRESSION: Degenerative disc and facet disease.  No acute bony abnormality.   Original Report Authenticated By: Charlett Nose, M.D.      3:55 PM Patient was seen and evaluate for a recent fall. She is able to ambulate, and has no pre-precipitating symptoms prior to fall. No LOC. Workup initiated, x-ray ordered. Tylenol given for pain. Care discussed with attending.  5:02 PM Patient is afebrile with stable normal vital sign. Negative orthostatic hypotension, ECG unremarkable, x-ray reviewed by me shows no acute bony abnormalities, no evidence of anemia.  Normal glucose  Her UA shows 11-20 WBC with large amount of leukocytes and many bacteria.  Although there are many squamous epithelial cells, i will treat pt for a UTI. Urine culture sent.  Pt schedule to f/u with PCP on Thursday.  Pt able to ambulate without difficulty.    BP 162/83  Pulse 82  Temp(Src) 98.3 F (36.8 C) (Oral)  Resp 18  SpO2 98%  I have reviewed nursing notes and vital signs. I personally reviewed the imaging tests through PACS system  I reviewed available ER/hospitalization records thought the EMR  1. Fall 2. Low back pain 3. UTI  MDM          Fayrene Helper, PA-C 10/26/12 1720

## 2012-10-26 NOTE — ED Notes (Signed)
Pt states this morning she was walking and her body just gave out, caught herself on the sofa, denies LOC or hitting head, complaining of lower back pain, states hurts when sitting or getting up from sitting position.

## 2012-10-27 LAB — URINE CULTURE: Colony Count: 55000

## 2012-10-30 NOTE — ED Provider Notes (Signed)
Medical screening examination/treatment/procedure(s) were conducted as a shared visit with non-physician practitioner(s) and myself.  I personally evaluated the patient during the encounter.  78yF with back pain after fall. Suspect contusion. Imaging neg. W/u reassuring. Plan symptomatic tx.   Raeford Razor, MD 10/30/12 [phone]

## 2012-11-01 ENCOUNTER — Other Ambulatory Visit: Payer: Self-pay | Admitting: Family Medicine

## 2012-11-01 DIAGNOSIS — R1031 Right lower quadrant pain: Secondary | ICD-10-CM

## 2012-11-11 ENCOUNTER — Other Ambulatory Visit: Payer: Medicare Other

## 2012-11-26 ENCOUNTER — Ambulatory Visit: Payer: Medicare Other | Admitting: Internal Medicine

## 2012-12-27 ENCOUNTER — Ambulatory Visit: Payer: Medicare Other | Admitting: Internal Medicine

## 2013-01-14 ENCOUNTER — Other Ambulatory Visit (HOSPITAL_COMMUNITY): Payer: Self-pay | Admitting: Family Medicine

## 2013-01-14 DIAGNOSIS — R1031 Right lower quadrant pain: Secondary | ICD-10-CM

## 2013-01-15 ENCOUNTER — Other Ambulatory Visit (HOSPITAL_COMMUNITY): Payer: Self-pay | Admitting: Family Medicine

## 2013-01-15 DIAGNOSIS — R911 Solitary pulmonary nodule: Secondary | ICD-10-CM

## 2013-01-21 ENCOUNTER — Ambulatory Visit (HOSPITAL_COMMUNITY): Payer: Medicare Other

## 2013-01-21 ENCOUNTER — Other Ambulatory Visit (HOSPITAL_COMMUNITY): Payer: Medicare Other

## 2013-02-07 ENCOUNTER — Telehealth: Payer: Self-pay | Admitting: Internal Medicine

## 2013-02-07 NOTE — Telephone Encounter (Signed)
New Problem  Pt states that she has been tired lately and has been taking Xarelto, she wants to know if that can cause her blood to go thin, which is making her tired. She also wants to know if she is suppose to come in and have her blood test while on this medication. She states it has been a year since she had it done.

## 2013-02-07 NOTE — Telephone Encounter (Signed)
Spoke with pt, she reports feeling very tired. She can not figure out what is going. Her bp is running 123/72, pulse 74, 02 % 98. She denies palpitations. Pt thinks her blood may be too thin and wants to get that checked. Explained to pt INR will not be helpful with xarelto and her labs from 10-2012 when she was in the hosp was normal. She has a follow up appt with dr Graciela Husbands on 03-07-13 and requested to be seen sooner if possible. Pt will see lori gerhardt np Tuesday next week. She agreed with this plan.

## 2013-02-11 ENCOUNTER — Ambulatory Visit (INDEPENDENT_AMBULATORY_CARE_PROVIDER_SITE_OTHER): Payer: Medicare Other | Admitting: Nurse Practitioner

## 2013-02-11 ENCOUNTER — Encounter: Payer: Self-pay | Admitting: Nurse Practitioner

## 2013-02-11 VITALS — BP 146/80 | HR 56 | Ht 65.0 in | Wt 170.0 lb

## 2013-02-11 DIAGNOSIS — R5381 Other malaise: Secondary | ICD-10-CM

## 2013-02-11 DIAGNOSIS — R5383 Other fatigue: Secondary | ICD-10-CM

## 2013-02-11 NOTE — Patient Instructions (Addendum)
Keep your visit with Dr. Graciela Husbands  I would encourage you to stay on your current medicines  I think you need to focus on you - walking daily, sleeping at night, eating well, etc.  Call the [facility] office at [phone] if you have any questions, problems or concerns.

## 2013-02-11 NOTE — Progress Notes (Signed)
Tiffany Black Date of Birth: [birth year] Medical Record #[id number]  History of Present Illness: Tiffany Black is seen back today for a work in visit. Seen for Dr. Graciela Husbands and Dr. [place]. Formerly seen by Dr. Ladona Ridgel. Has HTN, PAF managed with beta blocker and Xarelto. Last seen by Dr. [place] in August of 2013 and now sees only Dr. Graciela Husbands with last visit in October of 2013.   She comes in today. She is here alone. She has multiple somatic complaints. She is basically terrified of "getting old and going to the nursing home". She is quite anxious. She feels tremulous. Rare palpitations. Not exercising. Her husband is doing better from his cancer standpoint but will be having repeat scans soon and she is worried about those results. She has been quite stressed with being the caregiver. She is quite anxious about the future in general. She wants off her Prozac.   Current Outpatient Prescriptions on File Prior to Visit  Medication Sig Dispense Refill  . acetaminophen (TYLENOL) 500 MG tablet Take 1 tablet (500 mg total) by mouth every 6 (six) hours as needed for pain.  30 tablet  0  . cholecalciferol (VITAMIN D) 1000 UNITS tablet Take 1,000 Units by mouth daily.      . felodipine (PLENDIL) 5 MG 24 hr tablet Take 5 mg by mouth daily.       Marland Kitchen FLUoxetine (PROZAC) 10 MG tablet Take 10 mg by mouth daily.      Marland Kitchen labetalol (NORMODYNE) 200 MG tablet Take 200 mg by mouth 2 (two) times daily.      . meclizine (ANTIVERT) 25 MG tablet Take 25 mg by mouth 3 (three) times daily as needed. Dizziness      . potassium chloride (KLOR-CON) 10 MEQ CR tablet Take 10 mEq by mouth daily.       . Rivaroxaban (XARELTO) 20 MG TABS Take 1 tablet (20 mg total) by mouth daily.  30 tablet  6  . valsartan (DIOVAN) 80 MG tablet Take 80 mg by mouth 2 (two) times daily.       Marland Kitchen zolpidem (AMBIEN) 5 MG tablet Take 5 mg by mouth at bedtime as needed. For sleep      . furosemide (LASIX) 20 MG tablet Take 20 mg by mouth daily.        No  current facility-administered medications on file prior to visit.    Allergies  Allergen Reactions  . Carbamazepine Other (See Comments)    unknown  . Codeine Nausea And Vomiting  . Hydrochlorothiazide Other (See Comments)    unknown  . Penicillins Hives  . Sulfonamide Derivatives Hives    Past Medical History  Diagnosis Date  . Hypertension   . Arthritis   . 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 Endometrial ca   . Anxiety   . HLD (hyperlipidemia)   . Cardiomegaly   . Pectus excavatum   . OA (osteoarthritis)     Past Surgical History  Procedure Laterality Date  . Cesarean section    . Laparoscopic hysterectomy  2011    History  Smoking status  . Never Smoker   Smokeless tobacco  . Not on file    History  Alcohol Use No    Family History  Problem Relation Age of Onset  . Heart attack Father   . Heart disease Father  Review of Systems: The review of systems is per the HPI.  All other systems were reviewed and are negative.  Physical Exam: BP 146/80  Pulse 56  Ht 5\' 5"  (1.651 m)  Wt 170 lb (77.111 kg)  BMI 28.29 kg/m2 Patient is very pleasant and in no acute distress. Skin is warm and dry. Color is normal.  HEENT is unremarkable. Normocephalic/atraumatic. PERRL. Sclera are nonicteric. Neck is supple. No masses. No JVD. Lungs are clear. Cardiac exam shows a regular rate and rhythm. Abdomen is soft. Extremities are without edema. Gait and ROM are intact. No gross neurologic deficits noted.  LABORATORY DATA:   Labs are followed by her PCP - Dr. Laurann Montana   Assessment / Plan: 1. Somatic complaints - seems to be driven by generalized anxiety - I do not think she needs to stop any of her medicines at this time. May actually need more in regards to SSRI therapy. Would defer to her PCP. I encouraged her to focus on her own health  measures.   2. PAF - in sinus by exam today. Remains on Xarelto.  Will see her back at her regular visit.   Patient is agreeable to this plan and will call if any problems develop in the interim.   Rosalio Macadamia, RN, ANP-C [facility] [address] [place], [place]  [address]

## 2013-02-18 ENCOUNTER — Encounter: Payer: Self-pay | Admitting: Gastroenterology

## 2013-02-27 ENCOUNTER — Emergency Department (HOSPITAL_COMMUNITY)
Admission: EM | Admit: 2013-02-27 | Discharge: 2013-02-27 | Disposition: A | Discharge status: Home / Self Care | Payer: Medicare Other | Attending: Emergency Medicine | Admitting: Emergency Medicine

## 2013-02-27 ENCOUNTER — Encounter (HOSPITAL_COMMUNITY): Payer: Self-pay | Admitting: Emergency Medicine

## 2013-02-27 ENCOUNTER — Emergency Department (HOSPITAL_COMMUNITY): Payer: Medicare Other

## 2013-02-27 DIAGNOSIS — Z8639 Personal history of other endocrine, nutritional and metabolic disease: Secondary | ICD-10-CM | POA: Insufficient documentation

## 2013-02-27 DIAGNOSIS — M542 Cervicalgia: Secondary | ICD-10-CM | POA: Insufficient documentation

## 2013-02-27 DIAGNOSIS — Z79899 Other long term (current) drug therapy: Secondary | ICD-10-CM | POA: Insufficient documentation

## 2013-02-27 DIAGNOSIS — Z8542 Personal history of malignant neoplasm of other parts of uterus: Secondary | ICD-10-CM | POA: Insufficient documentation

## 2013-02-27 DIAGNOSIS — Z862 Personal history of diseases of the blood and blood-forming organs and certain disorders involving the immune mechanism: Secondary | ICD-10-CM | POA: Insufficient documentation

## 2013-02-27 DIAGNOSIS — H8110 Benign paroxysmal vertigo, unspecified ear: Secondary | ICD-10-CM | POA: Insufficient documentation

## 2013-02-27 DIAGNOSIS — I10 Essential (primary) hypertension: Secondary | ICD-10-CM | POA: Insufficient documentation

## 2013-02-27 DIAGNOSIS — Z8679 Personal history of other diseases of the circulatory system: Secondary | ICD-10-CM | POA: Insufficient documentation

## 2013-02-27 DIAGNOSIS — Z8739 Personal history of other diseases of the musculoskeletal system and connective tissue: Secondary | ICD-10-CM | POA: Insufficient documentation

## 2013-02-27 DIAGNOSIS — F411 Generalized anxiety disorder: Secondary | ICD-10-CM | POA: Insufficient documentation

## 2013-02-27 HISTORY — DX: Dizziness and giddiness: R42

## 2013-02-27 LAB — BASIC METABOLIC PANEL
Calcium: 9.4 mg/dL (ref 8.4–10.5)
Creatinine, Ser: 0.73 mg/dL (ref 0.50–1.10)
GFR calc non Af Amer: 80 mL/min — ABNORMAL LOW (ref >90)
Glucose, Bld: 105 mg/dL — ABNORMAL HIGH (ref 70–99)
Sodium: 138 mEq/L (ref 135–145)

## 2013-02-27 LAB — CBC WITH DIFFERENTIAL/PLATELET
Basophils Absolute: 0 10*3/uL (ref 0.0–0.1)
Basophils Relative: 0 % (ref 0–1)
Eosinophils Absolute: 0.1 10*3/uL (ref 0.0–0.7)
Eosinophils Relative: 2 % (ref 0–5)
Lymphs Abs: 1.6 10*3/uL (ref 0.7–4.0)
MCH: 30.8 pg (ref 26.0–34.0)
MCHC: 33.9 g/dL (ref 30.0–36.0)
Neutrophils Relative %: 71 % (ref 43–77)
Platelets: 211 10*3/uL (ref 150–400)
RBC: 4.29 MIL/uL (ref 3.87–5.11)
RDW: 13.5 % (ref 11.5–15.5)

## 2013-02-27 LAB — PROTIME-INR: Prothrombin Time: 13.7 seconds (ref 11.6–15.2)

## 2013-02-27 LAB — APTT: aPTT: 29 seconds (ref 24–37)

## 2013-02-27 MED ORDER — METOCLOPRAMIDE HCL 5 MG/ML IJ SOLN
10.0000 mg | Freq: Once | INTRAMUSCULAR | Status: AC
  Administered 2013-02-27: 10 mg via INTRAVENOUS
  Filled 2013-02-27: qty 2

## 2013-02-27 MED ORDER — MECLIZINE HCL 50 MG PO TABS: 25.0000 mg | ORAL_TABLET | Freq: Three times a day (TID) | ORAL | Status: DC | PRN

## 2013-02-27 MED ORDER — MECLIZINE HCL 25 MG PO TABS
50.0000 mg | ORAL_TABLET | Freq: Once | ORAL | Status: AC
  Administered 2013-02-27: 50 mg via ORAL
  Filled 2013-02-27: qty 2

## 2013-02-27 MED ORDER — LABETALOL HCL 100 MG PO TABS
100.0000 mg | ORAL_TABLET | Freq: Once | ORAL | Status: AC
  Administered 2013-02-27: 100 mg via ORAL
  Filled 2013-02-27: qty 1

## 2013-02-27 MED ORDER — SODIUM CHLORIDE 0.9 % IV BOLUS (SEPSIS)
1000.0000 mL | Freq: Once | INTRAVENOUS | Status: AC
  Administered 2013-02-27: 1000 mL via INTRAVENOUS

## 2013-02-27 NOTE — ED Notes (Signed)
Upon sitting up pt became dizzy and did not feel stable.  Informed MD.  MRI ordered.

## 2013-02-27 NOTE — ED Notes (Signed)
MD at bedside. 

## 2013-02-27 NOTE — ED Notes (Signed)
Patient transported to MRI 

## 2013-02-27 NOTE — ED Notes (Signed)
Pt refuses to go to MRI unless she takes home BP med.

## 2013-02-27 NOTE — Progress Notes (Signed)
   CARE MANAGEMENT ED NOTE 02/27/2013  Patient:  Tiffany Black, Tiffany Black   Account Number:  [id number]  Date Initiated:  02/27/2013  Documentation initiated by:  Radford Pax  Subjective/Objective Assessment:   Patient presented to ED with dizziness.     Subjective/Objective Assessment Detail:     Action/Plan:   Action/Plan Detail:   Anticipated DC Date:  02/27/2013     Status Recommendation to Physician:   Result of Recommendation:    Other ED Services  Consult Working Plan    DC Planning Services  Other    Choice offered to / List presented to:            Status of service:  Completed, signed off  ED Comments:   ED Comments Detail:  EDCM spoke to patient and family member at bedside.  No home health needs at this time as per patient.

## 2013-02-27 NOTE — ED Provider Notes (Signed)
History     CSN: [id number]  Arrival date & time 02/27/13  1140   First MD Initiated Contact with Patient 02/27/13 1152      Chief Complaint  Patient presents with  . Dizziness    (Consider location/radiation/quality/duration/timing/severity/associated sxs/prior treatment) HPI Comments: Pt comes in with cc of dizziness. Pt has hx of vertigo. Reports that she takes antivert prn, however, starting yday she has been having more severe vertigo than usual and that her antivert didn't help. She has no nausea, vomiting, visual complains, seizures, altered mental status, loss of consciousness, new weakness, or numbness. Pt states that she has hx of lateral neck and posterior headaches - and they too feel a little worse. PT has no hearing loss, ringing in her ears. She denies any recent falls. The vertigo is lasting for few minutes, worse with certain positions and there is mild nausea.   The history is provided by the patient.    Past Medical History  Diagnosis Date  . Hypertension   . Arthritis   . 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 Endometrial ca   . Anxiety   . HLD (hyperlipidemia)   . Cardiomegaly   . Pectus excavatum   . OA (osteoarthritis)   . Vertigo     Past Surgical History  Procedure Laterality Date  . Cesarean section    . Laparoscopic hysterectomy  2011    Family History  Problem Relation Age of Onset  . Heart attack Father   . Heart disease Father     History  Substance Use Topics  . Smoking status: Never Smoker   . Smokeless tobacco: Not on file  . Alcohol Use: No    OB History   Grav Para Term Preterm Abortions TAB SAB Ect Mult Living                  Review of Systems  Constitutional: Positive for activity change. Negative for fever.  HENT: Positive for neck pain. Negative for ear pain, facial swelling, neck stiffness and  tinnitus.   Respiratory: Negative for cough, shortness of breath and wheezing.   Cardiovascular: Negative for chest pain.  Gastrointestinal: Negative for nausea, vomiting, abdominal pain, diarrhea, constipation, blood in stool and abdominal distention.  Genitourinary: Negative for hematuria and difficulty urinating.  Skin: Negative for color change.  Neurological: Positive for light-headedness. Negative for syncope, speech difficulty, weakness and numbness.  Hematological: Does not bruise/bleed easily.  Psychiatric/Behavioral: Negative for confusion.    Allergies  Carbamazepine; Codeine; Hydrochlorothiazide; Penicillins; and Sulfonamide derivatives  Home Medications   Current Outpatient Rx  Name  Route  Sig  Dispense  Refill  . cholecalciferol (VITAMIN D) 1000 UNITS tablet   Oral   Take 1,000 Units by mouth daily.         . felodipine (PLENDIL) 5 MG 24 hr tablet   Oral   Take 5 mg by mouth daily.          Marland Kitchen FLUoxetine (PROZAC) 10 MG tablet   Oral   Take 5 mg by mouth daily.          . furosemide (LASIX) 20 MG tablet   Oral   Take 20 mg by mouth daily.          Marland Kitchen labetalol (NORMODYNE) 200 MG tablet   Oral   Take 200 mg by mouth 2 (two)  times daily.         . meclizine (ANTIVERT) 25 MG tablet   Oral   Take 25 mg by mouth 3 (three) times daily as needed. Dizziness         . Polyethyl Glycol-Propyl Glycol (SYSTANE) 0.4-0.3 % SOLN   Both Eyes   Place 1 drop into both eyes daily as needed (for dry eyes).         . potassium chloride (KLOR-CON) 10 MEQ CR tablet   Oral   Take 10 mEq by mouth daily.          . Rivaroxaban (XARELTO) 20 MG TABS   Oral   Take 1 tablet (20 mg total) by mouth daily.   30 tablet   6   . valsartan (DIOVAN) 80 MG tablet   Oral   Take 80 mg by mouth 2 (two) times daily.          Marland Kitchen zolpidem (AMBIEN) 5 MG tablet   Oral   Take 5 mg by mouth at bedtime as needed. For sleep           BP 157/76  Pulse 65  Temp(Src) 98.4  F (36.9 C)  Resp 16  SpO2 97%  Physical Exam  Constitutional: She is oriented to person, place, and time. She appears well-developed and well-nourished.  HENT:  Head: Normocephalic and atraumatic.  Eyes: EOM are normal. Pupils are equal, round, and reactive to light.  No nystagmus  Neck: Neck supple.  Cardiovascular: Normal rate, regular rhythm and normal heart sounds.   No murmur heard. Pulmonary/Chest: Effort normal. No respiratory distress.  Abdominal: Soft. She exhibits no distension. There is no tenderness. There is no rebound and no guarding.  Neurological: She is alert and oriented to person, place, and time. No cranial nerve deficit. Coordination normal.  Cerebellar exam is normal (finger to nose) Sensory exam normal for bilateral upper and lower extremities - and patient is able to discriminate between sharp and dull. Motor exam is 4+/5    Skin: Skin is warm and dry.    ED Course  Procedures (including critical care time)  Labs Reviewed  BASIC METABOLIC PANEL  CBC WITH DIFFERENTIAL  APTT  PROTIME-INR   No results found.   No diagnosis found.    MDM   Date: 02/27/2013  Rate: 62  Rhythm: normal sinus rhythm  QRS Axis: normal  Intervals: normal  ST/T Wave abnormalities: non specific t wave changes  Conduction Disutrbances: none  Narrative Interpretation: unremarkable    DDx includes: Central vertigo:  Tumor  Stroke  ICH  Vertebrobasilar TIA  Peripheral Vertigo:  BPPV  Vestibular neuritis  Meniere disease  Migrainous vertigo  Ear Infection    Pt comes in with cc of vertigo. Hx of same, but states that the current episode if worse. She has no focal neuro deficits on exam. ENT exam, cerebellar exam normal, no nystagmus. Pt's pain is worse with certain positions, is moderately severe - so consistent with peripheral vertigo. Will get Ct head as she is on anticoagulant and thereby to r/o bleed.   LATE ENTRY: Pt better with antivert, so i had  a lengthy discussion with the patient and placed the discharge paperwork. Return precautions were discussed and pcp f/u advocated. When the d/x paperwork was ready, patient stated her sx had returned.  Her vertigo appears to be peripheral per hx, but it is long lasting, and not extremely severe, so some features of central cause. No bruits on neck  exam.  We will get MRI, which if negative for stroke she will be discharged with a dx of BPPV.  Derwood Kaplan, MD 02/28/13 1448

## 2013-02-27 NOTE — ED Notes (Signed)
Pt states she has had intermittent dizziness on L side of head since 0800.  Pt states she has hx of vertigo, but this is worse than usual.  Pt states she has had some double vision which gone away at this point.

## 2013-02-28 ENCOUNTER — Telehealth: Payer: Self-pay | Admitting: Internal Medicine

## 2013-02-28 ENCOUNTER — Emergency Department (HOSPITAL_COMMUNITY): Payer: Medicare Other

## 2013-02-28 ENCOUNTER — Encounter (HOSPITAL_COMMUNITY): Payer: Self-pay | Admitting: Emergency Medicine

## 2013-02-28 ENCOUNTER — Observation Stay (HOSPITAL_COMMUNITY)
Admission: EM | Admit: 2013-02-28 | Discharge: 2013-03-04 | Disposition: A | Discharge status: Skilled Nursing Facility | Payer: Medicare Other | Attending: Internal Medicine | Admitting: Internal Medicine

## 2013-02-28 DIAGNOSIS — H8110 Benign paroxysmal vertigo, unspecified ear: Principal | ICD-10-CM | POA: Diagnosis present

## 2013-02-28 DIAGNOSIS — I480 Paroxysmal atrial fibrillation: Secondary | ICD-10-CM | POA: Diagnosis present

## 2013-02-28 DIAGNOSIS — R05 Cough: Secondary | ICD-10-CM | POA: Insufficient documentation

## 2013-02-28 DIAGNOSIS — M1990 Unspecified osteoarthritis, unspecified site: Secondary | ICD-10-CM | POA: Insufficient documentation

## 2013-02-28 DIAGNOSIS — F329 Major depressive disorder, single episode, unspecified: Secondary | ICD-10-CM | POA: Insufficient documentation

## 2013-02-28 DIAGNOSIS — I4891 Unspecified atrial fibrillation: Secondary | ICD-10-CM

## 2013-02-28 DIAGNOSIS — R059 Cough, unspecified: Secondary | ICD-10-CM | POA: Insufficient documentation

## 2013-02-28 DIAGNOSIS — H81399 Other peripheral vertigo, unspecified ear: Secondary | ICD-10-CM

## 2013-02-28 DIAGNOSIS — H532 Diplopia: Secondary | ICD-10-CM | POA: Insufficient documentation

## 2013-02-28 DIAGNOSIS — F419 Anxiety disorder, unspecified: Secondary | ICD-10-CM | POA: Diagnosis present

## 2013-02-28 DIAGNOSIS — J329 Chronic sinusitis, unspecified: Secondary | ICD-10-CM | POA: Diagnosis present

## 2013-02-28 DIAGNOSIS — IMO0001 Reserved for inherently not codable concepts without codable children: Secondary | ICD-10-CM | POA: Insufficient documentation

## 2013-02-28 DIAGNOSIS — F411 Generalized anxiety disorder: Secondary | ICD-10-CM | POA: Insufficient documentation

## 2013-02-28 DIAGNOSIS — Z79899 Other long term (current) drug therapy: Secondary | ICD-10-CM | POA: Insufficient documentation

## 2013-02-28 DIAGNOSIS — E785 Hyperlipidemia, unspecified: Secondary | ICD-10-CM | POA: Insufficient documentation

## 2013-02-28 DIAGNOSIS — F3289 Other specified depressive episodes: Secondary | ICD-10-CM | POA: Insufficient documentation

## 2013-02-28 DIAGNOSIS — I10 Essential (primary) hypertension: Secondary | ICD-10-CM | POA: Diagnosis present

## 2013-02-28 DIAGNOSIS — I951 Orthostatic hypotension: Secondary | ICD-10-CM | POA: Insufficient documentation

## 2013-02-28 DIAGNOSIS — R42 Dizziness and giddiness: Secondary | ICD-10-CM | POA: Diagnosis present

## 2013-02-28 DIAGNOSIS — G319 Degenerative disease of nervous system, unspecified: Secondary | ICD-10-CM | POA: Insufficient documentation

## 2013-02-28 DIAGNOSIS — R002 Palpitations: Secondary | ICD-10-CM | POA: Insufficient documentation

## 2013-02-28 DIAGNOSIS — Z90710 Acquired absence of both cervix and uterus: Secondary | ICD-10-CM | POA: Insufficient documentation

## 2013-02-28 LAB — URINE MICROSCOPIC-ADD ON

## 2013-02-28 LAB — BASIC METABOLIC PANEL
BUN: 9 mg/dL (ref 6–23)
Calcium: 9.4 mg/dL (ref 8.4–10.5)
Creatinine, Ser: 0.61 mg/dL (ref 0.50–1.10)
GFR calc Af Amer: >90 mL/min (ref >90)
GFR calc non Af Amer: 85 mL/min — ABNORMAL LOW (ref >90)
Glucose, Bld: 118 mg/dL — ABNORMAL HIGH (ref 70–99)

## 2013-02-28 LAB — CBC WITH DIFFERENTIAL/PLATELET
Basophils Absolute: 0 10*3/uL (ref 0.0–0.1)
Basophils Relative: 0 % (ref 0–1)
Eosinophils Absolute: 0 10*3/uL (ref 0.0–0.7)
MCH: 31.1 pg (ref 26.0–34.0)
MCHC: 34.7 g/dL (ref 30.0–36.0)
Monocytes Relative: 3 % (ref 3–12)
Neutro Abs: 7.7 10*3/uL (ref 1.7–7.7)
Neutrophils Relative %: 85 % — ABNORMAL HIGH (ref 43–77)
RDW: 13.3 % (ref 11.5–15.5)

## 2013-02-28 LAB — URINALYSIS, ROUTINE W REFLEX MICROSCOPIC
Bilirubin Urine: NEGATIVE
Hgb urine dipstick: NEGATIVE
Nitrite: NEGATIVE
Protein, ur: 30 mg/dL — AB
Specific Gravity, Urine: 1.018 (ref 1.005–1.030)
Urobilinogen, UA: 1 mg/dL (ref 0.0–1.0)

## 2013-02-28 MED ORDER — SODIUM CHLORIDE 0.9 % IJ SOLN: 3.0000 mL | Freq: Two times a day (BID) | INTRAMUSCULAR | Status: DC

## 2013-02-28 MED ORDER — POLYETHYL GLYCOL-PROPYL GLYCOL 0.4-0.3 % OP SOLN: 1.0000 [drp] | Freq: Every day | OPHTHALMIC | Status: DC | PRN

## 2013-02-28 MED ORDER — FLUTICASONE PROPIONATE 50 MCG/ACT NA SUSP
1.0000 | Freq: Every day | NASAL | Status: DC
  Administered 2013-02-28 – 2013-03-04 (×4): 1 via NASAL
  Filled 2013-02-28: qty 16

## 2013-02-28 MED ORDER — ACETAMINOPHEN 650 MG RE SUPP: 650.0000 mg | Freq: Four times a day (QID) | RECTAL | Status: DC | PRN

## 2013-02-28 MED ORDER — MECLIZINE HCL 25 MG PO TABS
25.0000 mg | ORAL_TABLET | Freq: Three times a day (TID) | ORAL | Status: DC
  Administered 2013-02-28 – 2013-03-04 (×12): 25 mg via ORAL
  Filled 2013-02-28 (×14): qty 1

## 2013-02-28 MED ORDER — RIVAROXABAN 20 MG PO TABS
20.0000 mg | ORAL_TABLET | Freq: Every day | ORAL | Status: DC
  Administered 2013-02-28 – 2013-03-04 (×5): 20 mg via ORAL
  Filled 2013-02-28 (×5): qty 1

## 2013-02-28 MED ORDER — LABETALOL HCL 200 MG PO TABS
200.0000 mg | ORAL_TABLET | Freq: Two times a day (BID) | ORAL | Status: DC
  Administered 2013-02-28 – 2013-03-04 (×8): 200 mg via ORAL
  Filled 2013-02-28 (×10): qty 1

## 2013-02-28 MED ORDER — SODIUM CHLORIDE 0.9 % IV SOLN
INTRAVENOUS | Status: AC
  Administered 2013-02-28: 17:00:00 via INTRAVENOUS

## 2013-02-28 MED ORDER — SODIUM CHLORIDE 0.9 % IV SOLN: 250.0000 mL | INTRAVENOUS | Status: DC | PRN

## 2013-02-28 MED ORDER — ACETAMINOPHEN 325 MG PO TABS
650.0000 mg | ORAL_TABLET | Freq: Four times a day (QID) | ORAL | Status: DC | PRN
  Administered 2013-02-28 – 2013-03-03 (×2): 650 mg via ORAL
  Filled 2013-02-28 (×3): qty 2

## 2013-02-28 MED ORDER — ONDANSETRON HCL 4 MG/2ML IJ SOLN: 4.0000 mg | Freq: Four times a day (QID) | INTRAMUSCULAR | Status: DC | PRN

## 2013-02-28 MED ORDER — FLUOXETINE HCL 20 MG PO TABS: 10.0000 mg | ORAL_TABLET | Freq: Every day | ORAL | Status: DC

## 2013-02-28 MED ORDER — LEVOFLOXACIN 500 MG PO TABS
500.0000 mg | ORAL_TABLET | Freq: Every day | ORAL | Status: DC
  Administered 2013-02-28 – 2013-03-04 (×5): 500 mg via ORAL
  Filled 2013-02-28 (×5): qty 1

## 2013-02-28 MED ORDER — ZOLPIDEM TARTRATE 5 MG PO TABS
5.0000 mg | ORAL_TABLET | Freq: Every evening | ORAL | Status: DC | PRN
  Administered 2013-03-01 – 2013-03-03 (×4): 5 mg via ORAL
  Filled 2013-02-28 (×4): qty 1

## 2013-02-28 MED ORDER — MECLIZINE HCL 25 MG PO TABS
50.0000 mg | ORAL_TABLET | Freq: Once | ORAL | Status: AC
  Administered 2013-02-28: 50 mg via ORAL
  Filled 2013-02-28: qty 2

## 2013-02-28 MED ORDER — VITAMIN D3 25 MCG (1000 UNIT) PO TABS
1000.0000 [IU] | ORAL_TABLET | Freq: Every day | ORAL | Status: DC
  Administered 2013-02-28 – 2013-03-04 (×5): 1000 [IU] via ORAL
  Filled 2013-02-28 (×5): qty 1

## 2013-02-28 MED ORDER — FELODIPINE ER 5 MG PO TB24
5.0000 mg | ORAL_TABLET | Freq: Every day | ORAL | Status: DC
  Administered 2013-02-28 – 2013-03-03 (×4): 5 mg via ORAL
  Filled 2013-02-28 (×6): qty 1

## 2013-02-28 MED ORDER — IRBESARTAN 75 MG PO TABS
75.0000 mg | ORAL_TABLET | Freq: Two times a day (BID) | ORAL | Status: DC
  Administered 2013-02-28 – 2013-03-04 (×8): 75 mg via ORAL
  Filled 2013-02-28 (×10): qty 1

## 2013-02-28 MED ORDER — POTASSIUM CHLORIDE CRYS ER 20 MEQ PO TBCR
40.0000 meq | EXTENDED_RELEASE_TABLET | Freq: Once | ORAL | Status: AC
  Administered 2013-02-28: 40 meq via ORAL
  Filled 2013-02-28: qty 2

## 2013-02-28 MED ORDER — SODIUM CHLORIDE 0.9 % IJ SOLN: 3.0000 mL | INTRAMUSCULAR | Status: DC | PRN

## 2013-02-28 MED ORDER — POLYVINYL ALCOHOL 1.4 % OP SOLN
1.0000 [drp] | OPHTHALMIC | Status: DC | PRN
  Administered 2013-03-03: 1 [drp] via OPHTHALMIC
  Filled 2013-02-28: qty 15

## 2013-02-28 MED ORDER — ONDANSETRON HCL 4 MG PO TABS: 4.0000 mg | ORAL_TABLET | Freq: Four times a day (QID) | ORAL | Status: DC | PRN

## 2013-02-28 MED ORDER — DIAZEPAM 2 MG PO TABS: 2.0000 mg | ORAL_TABLET | Freq: Once | ORAL | Status: DC

## 2013-02-28 MED ORDER — FLUOXETINE HCL 10 MG PO CAPS
10.0000 mg | ORAL_CAPSULE | Freq: Every day | ORAL | Status: DC
  Administered 2013-02-28 – 2013-03-04 (×5): 10 mg via ORAL
  Filled 2013-02-28 (×5): qty 1

## 2013-02-28 NOTE — Progress Notes (Signed)
Patient complains of having difficulty urinating and feels that she has a full bladder.  Bladder scan showed 639 ml.  Floor coverage NP was notified and placed order for I&O catheter, patient tried urinating again and was able to void so I did not do the I&O cath.  Also patient was requesting to have a CT scan of abdomen area because she has been having pain in her right side for almost a year now.  This was also reported to floor coverage.  I will continue to monitor patient.  Ernesta Amble, RN

## 2013-02-28 NOTE — ED Notes (Signed)
Patient ambulated with Nurse Tech. Patient walked from her room to the bathroom, and back. Gait was steady, however the patient stated feeling dizzy during walk and said, "it feels like my right side just isn't cooperating with the left."

## 2013-02-28 NOTE — Telephone Encounter (Signed)
Spoke with pt son, the pt had to go to the hosp yesterday because of elevated bp and vertigo. She was treated and sent home. This am she had to return to the ER due to extreme vertigo. Her son called to let us know she had been admitted. Explained to the son, if the pt wants [place] involved in her care she will need to ask the primary md to consult [place]. Son voiced understanding.

## 2013-02-28 NOTE — ED Provider Notes (Signed)
History     CSN: [id number]  Arrival date & time 02/28/13  0944   First MD Initiated Contact with Patient 02/28/13 [phone]      Chief Complaint  Patient presents with  . Dizziness    HPI Pt was seen at 0955.  Per pt, c/o gradual onset and persistence of multiple episodes of acute on chronic "dizziness" for the past 2 days. Pt describes the dizziness as"like I've had for years" and "everything is spinning around." Has been associated with "blurry vision" which she describes as "everything is moving" as well as "not feeling like I want to eat." States she has been able to eat and drink without choking, dysphagia, nausea or vomiting. Pt's symptoms worsen with body position changes. Pt has been taking antivert without complete relief.  States she was evaluated in the ED yesterday for the same symptoms; denies any change, worsening, or new symptoms today.  Pt also endorses she has been feeling "very anxious" for the past several months due to "my husband being ill" and has been self downward tapering her Prozac thinking "that would help" her anxiety.  Pt states she feels her "muscles are tense" and she is "worried about everything." Also states she has been taking her BP at home multiple times per day, every day, for the past several days. States her BP was "160/80's" at home which further "makes me worried" which makes all of her symptoms worsen.  Denies N/V/D, no abd pain, no CP/palpitations, no SOB/cough, no back pain, no neck pain, no fevers, no focal motor weakness, no tingling/numbness in extremities, no slurred speech, no facial droop, no syncope/near syncope.      Past Medical History  Diagnosis Date  . Hypertension   . Arthritis   . 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 Endometrial ca   . Anxiety   . HLD (hyperlipidemia)   . Cardiomegaly   . Pectus excavatum   . OA  (osteoarthritis)   . Vertigo     Past Surgical History  Procedure Laterality Date  . Cesarean section    . Laparoscopic hysterectomy  2011    Family History  Problem Relation Age of Onset  . Heart attack Father   . Heart disease Father     History  Substance Use Topics  . Smoking status: Never Smoker   . Smokeless tobacco: Not on file  . Alcohol Use: No      Review of Systems ROS: Statement: All systems negative except as marked or noted in the HPI; Constitutional: Negative for fever and chills. ; ; Eyes: Negative for eye pain, redness and discharge. ; ; ENMT: Negative for ear pain, hoarseness, nasal congestion, sinus pressure and sore throat. ; ; Cardiovascular: Negative for chest pain, palpitations, diaphoresis, dyspnea and peripheral edema. ; ; Respiratory: Negative for cough, wheezing and stridor. ; ; Gastrointestinal: Negative for nausea, vomiting, diarrhea, abdominal pain, blood in stool, hematemesis, jaundice and rectal bleeding. . ; ; Genitourinary: Negative for dysuria, flank pain and hematuria. ; ; Musculoskeletal: Negative for back pain and neck pain. Negative for swelling and trauma.; ; Skin: Negative for pruritus, rash, abrasions, blisters, bruising and skin lesion.; ; Neuro: +"dizziness."  Negative for headache, lightheadedness and neck stiffness. Negative for weakness, altered level of consciousness , altered mental status, extremity weakness, paresthesias, involuntary movement, seizure and syncope.; Psych:  +anxiety. No SI, no  SA, no HI, no hallucinations.      Allergies  Carbamazepine; Codeine; Hydrochlorothiazide; Penicillins; and Sulfonamide derivatives  Home Medications   Current Outpatient Rx  Name  Route  Sig  Dispense  Refill  . cholecalciferol (VITAMIN D) 1000 UNITS tablet   Oral   Take 1,000 Units by mouth daily.         . felodipine (PLENDIL) 5 MG 24 hr tablet   Oral   Take 5 mg by mouth daily.          Marland Kitchen FLUoxetine (PROZAC) 10 MG tablet    Oral   Take 5 mg by mouth daily.          . furosemide (LASIX) 20 MG tablet   Oral   Take 20 mg by mouth daily.          Marland Kitchen labetalol (NORMODYNE) 200 MG tablet   Oral   Take 200 mg by mouth 2 (two) times daily.         . meclizine (ANTIVERT) 25 MG tablet   Oral   Take 25 mg by mouth 3 (three) times daily as needed. Dizziness         . Polyethyl Glycol-Propyl Glycol (SYSTANE) 0.4-0.3 % SOLN   Both Eyes   Place 1 drop into both eyes daily as needed (for dry eyes).         . potassium chloride (KLOR-CON) 10 MEQ CR tablet   Oral   Take 10 mEq by mouth daily.          . Rivaroxaban (XARELTO) 20 MG TABS   Oral   Take 1 tablet (20 mg total) by mouth daily.   30 tablet   6   . valsartan (DIOVAN) 80 MG tablet   Oral   Take 80 mg by mouth 2 (two) times daily.          Marland Kitchen zolpidem (AMBIEN) 5 MG tablet   Oral   Take 5 mg by mouth at bedtime as needed. For sleep           BP 144/91  Pulse 82  Temp(Src) 98.8 F (37.1 C) (Oral)  Resp 20  SpO2 98%  Physical Exam 1000: Physical examination:  Nursing notes reviewed; Vital signs and O2 SAT reviewed;  Constitutional: Well developed, Well nourished, Well hydrated, In no acute distress; Head:  Normocephalic, atraumatic; Eyes: EOMI, PERRL, No scleral icterus; ENMT: Mouth and pharynx normal, Mucous membranes moist; Neck: Supple, Full range of motion, No lymphadenopathy; Cardiovascular: Regular rate and rhythm, No gallop; Respiratory: Breath sounds clear & equal bilaterally, No rales, rhonchi, wheezes.  Speaking full sentences with ease, Normal respiratory effort/excursion; Chest: Nontender, Movement normal; Abdomen: Soft, Nontender, Nondistended, Normal bowel sounds; Genitourinary: No CVA tenderness; Spine:  No midline CS, TS, LS tenderness.;; Extremities: Pulses normal, No tenderness, No edema, No calf edema or asymmetry.; Neuro: AA&Ox3, Major CN grossly intact.  Strength 5/5 equal bilat UE's and LE's.  DTR 2/4 equal bilat UE's  and LE's.  No gross sensory deficits.  Normal cerebellar testing bilat UE's (finger-nose) and LE's (heel-shin). Speech clear.  No facial droop. +left horizontal end gaze fatigable nystagmus.;; Skin: Color normal, Warm, Dry.; Psych:  Anxious, rapid pressured speech.    ED Course  Procedures   1010:  Very long d/w pt and her husband regarding her extensive ED workup yesterday, as well as not to check her BP multiple times per day, every day. Verb understanding but pt continues to perseverate regarding a CT scan  of her head 4 years ago that "showed one side of my brain was shrinking" as well as "the more I take my blood pressure the higher it goes up."  Again, re-focused pt on her MRI brain yesterday and re-read report, as well as reiterated no need to take her BP multiple times per day, every day, as well as normal daily variance of BP.  Pt and husband verb understanding.  Pt requesting "something different" to tx her vertigo, but does not want phenergan. Agrees to take a valium.   1200:  Pt refusing labs and now to take valium. States she "just wants to be admitted to the hospital."  Perseverating regarding various aches and pains from head to toe that have been present for the past 4 years and are without change today. States she "couldn't give a urine sample, then I took a sip of water and then I went."  Pt also insistent she needs another MRI of her brain and now needs a CT of her abdomen. Explained her CT-H was without change from yesterday and repeat MRI is not indicated, nor CT of her abd as her exam is benign. Pt then told me she is "dehydrated" because she "can't drink anything" as she is sipping on a cup of water without choking/gagging/nausea or vomiting. Another very long d/w pt and her husband regarding her ED visit today and her expectations. States again she "just wants to be admitted."  Long d/w pt regarding lack of clear admission criteria at this time, however she is now agreeable to take  antivert and have her labs checked.   1450:  Pt continues to c/o "feeling dizzy" when attempted to ambulate.  Dx and testing d/w pt and family.  Questions answered.  Verb understanding, agreeable to observation admit. T/C to [facility] Dr. Brien Few, case discussed, including:  HPI, pertinent PM/SHx, VS/PE, dx testing, ED course and treatment:  Agreeable to observation admit, requests to write temporary orders, obtain tele bed to team 8.   MDM  MDM Reviewed: previous chart, nursing note and vitals Reviewed previous: labs, ECG, x-ray, MRI and CT scan Interpretation: CT scan and x-ray      Date: 02/28/2013  Rate: 59  Rhythm: normal sinus rhythm  QRS Axis: left  Intervals: normal  ST/T Wave abnormalities: nonspecific T wave changes, TWI leads V3-V6, inferior leads  Conduction Disutrbances:none  Narrative Interpretation:   Old EKG Reviewed: unchanged; no significant changes from previous EKG dated 07/03/2012.   Dg Chest 2 View 02/28/2013   *RADIOLOGY REPORT*  Clinical Data: Dizziness  CHEST - 2 VIEW  Comparison: 02/04/2010  Findings: Cardiomediastinal silhouette is stable. No acute infiltrate or pulmonary edema.  Degenerative changes thoracic spine.  No pleural effusion.  IMPRESSION: No active disease.  No significant change.   Original Report Authenticated By: Natasha Mead, M.D.   Ct Head Wo Contrast 02/28/2013   *RADIOLOGY REPORT*  Clinical Data: Dizziness  CT HEAD WITHOUT CONTRAST  Technique:  Contiguous axial images were obtained from the base of the skull through the vertex without contrast.  Comparison: 02/27/2013  Findings: Again noted mucosal thickening left maxillary sinus.  No skull fracture.  No intracranial hemorrhage, mass effect or midline shift.  The mastoid air cells are unremarkable.  Stable mild cerebral atrophy.  Stable periventricular chronic white matter disease.  No acute infarction.  No mass lesion is noted on this unenhanced scan.  IMPRESSION: No acute intracranial abnormality.   Stable atrophy and chronic white matter disease.  No significant  change.  The   Original Report Authenticated By: Natasha Mead, M.D.    [name] Contrast 02/27/2013   *RADIOLOGY REPORT*  Clinical Data: Vertigo. Hypertension.  Hyperlipidemia.  MRI HEAD WITHOUT CONTRAST  Technique:  Multiplanar, multiecho pulse sequences of the brain and surrounding structures were obtained according to standard protocol without intravenous contrast.  Comparison: 02/27/2013 CT.  Findings: No acute infarct.  No intracranial hemorrhage.  Prominent small vessel disease type changes asymmetric greater on the left.  Other causes of white matter type changes felt to be less likely consideration.  Atrophy without hydrocephalus.  No intracranial mass lesion detected on this unenhanced exam.  Opacification left maxillary sinus which demonstrates restricted motion which may indicate bacterial infection versus presence proteinaceous material. Minimal to mild mucosal thickening ethmoid sinus air cells.  Major intracranial vascular structures are patent.  Cervical medullary junction, pituitary region, pineal region and orbital structures unremarkable.  IMPRESSION: No acute infarct.  Prominent small vessel disease type changes asymmetric greater on the left.  Opacification left maxillary sinus which demonstrates restricted motion which may indicate bacterial infection versus presence proteinaceous material. Minimal to mild mucosal thickening ethmoid sinus air cells.   Original Report Authenticated By: Lacy Duverney, M.D.    Results for orders placed during the hospital encounter of 02/28/13  BASIC METABOLIC PANEL      Result Value Range   Sodium 137  135 - 145 mEq/L   Potassium 3.4 (*) 3.5 - 5.1 mEq/L   Chloride 101  96 - 112 mEq/L   CO2 24  19 - 32 mEq/L   Glucose, Bld 118 (*) 70 - 99 mg/dL   BUN 9  6 - 23 mg/dL   Creatinine, Ser 1.61  0.50 - 1.10 mg/dL   Calcium 9.4  8.4 - 09.6 mg/dL   GFR calc non Af Amer 85 (*) >90 mL/min   GFR  calc Af Amer >90  >90 mL/min  CBC WITH DIFFERENTIAL      Result Value Range   WBC 9.0  4.0 - 10.5 K/uL   RBC 4.44  3.87 - 5.11 MIL/uL   Hemoglobin 13.8  12.0 - 15.0 g/dL   HCT 04.5  40.9 - 81.1 %   MCV 89.6  78.0 - 100.0 fL   MCH 31.1  26.0 - 34.0 pg   MCHC 34.7  30.0 - 36.0 g/dL   RDW 91.4  78.2 - 95.6 %   Platelets 208  150 - 400 K/uL   Neutrophils Relative % 85 (*) 43 - 77 %   Neutro Abs 7.7  1.7 - 7.7 K/uL   Lymphocytes Relative 11 (*) 12 - 46 %   Lymphs Abs 1.0  0.7 - 4.0 K/uL   Monocytes Relative 3  3 - 12 %   Monocytes Absolute 0.3  0.1 - 1.0 K/uL   Eosinophils Relative 0  0 - 5 %   Eosinophils Absolute 0.0  0.0 - 0.7 K/uL   Basophils Relative 0  0 - 1 %   Basophils Absolute 0.0  0.0 - 0.1 K/uL  URINALYSIS, ROUTINE W REFLEX MICROSCOPIC      Result Value Range   Color, Urine YELLOW  YELLOW   APPearance CLOUDY (*) CLEAR   Specific Gravity, Urine 1.018  1.005 - 1.030   pH 7.0  5.0 - 8.0   Glucose, UA NEGATIVE  NEGATIVE mg/dL   Hgb urine dipstick NEGATIVE  NEGATIVE   Bilirubin Urine NEGATIVE  NEGATIVE   Ketones, ur NEGATIVE  NEGATIVE  mg/dL   Protein, ur 30 (*) NEGATIVE mg/dL   Urobilinogen, UA 1.0  0.0 - 1.0 mg/dL   Nitrite NEGATIVE  NEGATIVE   Leukocytes, UA MODERATE (*) NEGATIVE  TROPONIN I      Result Value Range   Troponin I <0.30  <0.30 ng/mL  URINE MICROSCOPIC-ADD ON      Result Value Range   Squamous Epithelial / LPF FEW (*) RARE   WBC, UA 3-6  <3 WBC/hpf   Bacteria, UA FEW (*) RARE       Laray Anger, DO 03/03/13 1505

## 2013-02-28 NOTE — Telephone Encounter (Signed)
New problem    Patient some how left message on medical records voice mail per Selena Batten.   Patient stated she was in the hospital on yesterday had a CT / MRI done.   C/O blurred vision & dizziness.

## 2013-02-28 NOTE — H&P (Signed)
PCP:   Cala Bradford, MD   Chief Complaint:  Severe dizziness, unable to walk.   HPI: This is a 79 year old female, with known history of HTN, PAF on Xarelto, dyslipidemia, OA, vertigo, anxiety, previous endometrial cancer s/p laparoscopic hysterectomy. According to patient, she has had episodic vertigo for about 5-10 years, and currently utilizes prn Meclizine for this, although only once in a while. Over the [past 2 days, she has experienced severe dizziness/vertigo, and although this is mildly present at rest, it is exacerbated by movement, and she has found it difficult to ambulate without assistance. She denies tinnitus or ear pain, nausea or vomiting, and has a mild non-productive cough, which she attributes to and episode of "acute bronchitis" in April 2014, which she took Azithromycin for. The cough however, "never went away". She denies nausea or vomiting. Since 02/23/13, she had reduced her Prozac from 10 mg daily to 5 mg daily, as she felt it made her "slow", and her last dose was on 02/26/13. He BP has been elevated,since she attempted to reduce her Prozac. She was seen in the ED on 02/27/13, Head CT scan and Brain MRI were negative for acute findings, and she was sent home with a prescription for Meclizine. She took Meclizine when she got home, but has a restless night, with urinary frequency, was unable to sleep much. In AM of 02/28/13, symptoms appeared worse, so she called 911.     Allergies:   Allergies  Allergen Reactions  . Carbamazepine Other (See Comments)    unknown  . Codeine Nausea And Vomiting  . Hydrochlorothiazide Other (See Comments)    unknown  . Penicillins Hives  . Sulfonamide Derivatives Hives      Past Medical History  Diagnosis Date  . Hypertension   . Arthritis   . 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 Endometrial ca   . Anxiety    . HLD (hyperlipidemia)   . Cardiomegaly   . Pectus excavatum   . OA (osteoarthritis)   . Vertigo     Past Surgical History  Procedure Laterality Date  . Cesarean section    . Laparoscopic hysterectomy  2011  . Abdominal hysterectomy    . Cataract extraction, bilateral      Prior to Admission medications   Medication Sig Start Date End Date Taking? Authorizing Provider  cholecalciferol (VITAMIN D) 1000 UNITS tablet Take 1,000 Units by mouth daily.   Yes Historical Provider, MD  felodipine (PLENDIL) 5 MG 24 hr tablet Take 5 mg by mouth daily.    Yes Historical Provider, MD  FLUoxetine (PROZAC) 10 MG tablet Take 5 mg by mouth daily.    Yes Historical Provider, MD  furosemide (LASIX) 20 MG tablet Take 20 mg by mouth daily.    Yes Historical Provider, MD  labetalol (NORMODYNE) 200 MG tablet Take 200 mg by mouth 2 (two) times daily.   Yes Historical Provider, MD  meclizine (ANTIVERT) 25 MG tablet Take 25 mg by mouth 3 (three) times daily as needed. Dizziness   Yes Historical Provider, MD  Polyethyl Glycol-Propyl Glycol (SYSTANE) 0.4-0.3 % SOLN Place 1 drop into both eyes daily as needed (for dry eyes).   Yes Historical Provider, MD  potassium chloride (KLOR-CON) 10 MEQ CR tablet Take 10 mEq by mouth daily.    Yes Historical Provider, MD  Rivaroxaban (XARELTO) 20 MG TABS  Take 1 tablet (20 mg total) by mouth daily. 09/23/12  Yes Marinus Maw, MD  valsartan (DIOVAN) 80 MG tablet Take 80 mg by mouth 2 (two) times daily.    Yes Historical Provider, MD  zolpidem (AMBIEN) 5 MG tablet Take 5 mg by mouth at bedtime as needed. For sleep   Yes Historical Provider, MD    Social History: Patient reports that she has never smoked. She does not have any smokeless tobacco history on file. She reports that she does not drink alcohol or use illicit drugs.  Family History  Problem Relation Age of Onset  . Heart attack Father   . Heart disease Father     Review of Systems:  As per HPI and chief  complaint. Patent denies diminished appetite, weight loss, fever, chills, headache, blurred vision, difficulty in speaking, dysphagia, chest pain, shortness of breath, orthopnea, paroxysmal nocturnal dyspnea, nausea, diaphoresis, abdominal pain, vomiting, diarrhea, belching, heartburn, hematemesis, melena, dysuria, hematochezia, lower extremity swelling, pain, or redness. The rest of the systems review is negative.  Physical Exam:  General:  Patient does not appear to be in obvious acute distress. Alert, communicative, fully oriented, talking in complete sentences, not short of breath at rest.  HEENT:  No clinical pallor, no jaundice, no conjunctival injection or discharge. Hydration appears satisfactory. Otoscopic exam is negative.  NECK:  Supple, JVP not seen, no carotid bruits, no palpable lymphadenopathy, no palpable goiter. CHEST:  Clinically clear to auscultation, no wheezes, no crackles. HEART:  Sounds 1 and 2 heard, normal, regular, no murmurs. ABDOMEN:  Full, soft, non-tender, no palpable organomegaly, no palpable masses, normal bowel sounds. GENITALIA:  Not examined. LOWER EXTREMITIES:  No pitting edema, palpable peripheral pulses. MUSCULOSKELETAL SYSTEM:  Generalized osteoarthritic changes, otherwise, normal. CENTRAL NERVOUS SYSTEM:  Patient has nystagmus on left lateral gaze, otherwise. no focal neurologic deficit on gross examination.  Labs on Admission:  Results for orders placed during the hospital encounter of 02/28/13 (from the past 48 hour(s))  URINALYSIS, ROUTINE W REFLEX MICROSCOPIC     Status: Abnormal   Collection Time    02/28/13 11:36 AM      Result Value Range   Color, Urine YELLOW  YELLOW   APPearance CLOUDY (*) CLEAR   Specific Gravity, Urine 1.018  1.005 - 1.030   pH 7.0  5.0 - 8.0   Glucose, UA NEGATIVE  NEGATIVE mg/dL   Hgb urine dipstick NEGATIVE  NEGATIVE   Bilirubin Urine NEGATIVE  NEGATIVE   Ketones, ur NEGATIVE  NEGATIVE mg/dL   Protein, ur 30 (*)  NEGATIVE mg/dL   Urobilinogen, UA 1.0  0.0 - 1.0 mg/dL   Nitrite NEGATIVE  NEGATIVE   Leukocytes, UA MODERATE (*) NEGATIVE  URINE MICROSCOPIC-ADD ON     Status: Abnormal   Collection Time    02/28/13 11:36 AM      Result Value Range   Squamous Epithelial / LPF FEW (*) RARE   WBC, UA 3-6  <3 WBC/hpf   Bacteria, UA FEW (*) RARE  BASIC METABOLIC PANEL     Status: Abnormal   Collection Time    02/28/13 12:06 PM      Result Value Range   Sodium 137  135 - 145 mEq/L   Potassium 3.4 (*) 3.5 - 5.1 mEq/L   Chloride 101  96 - 112 mEq/L   CO2 24  19 - 32 mEq/L   Glucose, Bld 118 (*) 70 - 99 mg/dL   BUN 9  6 - 23 mg/dL  Creatinine, Ser 0.61  0.50 - 1.10 mg/dL   Calcium 9.4  8.4 - 16.1 mg/dL   GFR calc non Af Amer 85 (*) >90 mL/min   GFR calc Af Amer >90  >90 mL/min   Comment:            The eGFR has been calculated     using the CKD EPI equation.     This calculation has not been     validated in all clinical     situations.     eGFR's persistently     <90 mL/min signify     possible Chronic Kidney Disease.  CBC WITH DIFFERENTIAL     Status: Abnormal   Collection Time    02/28/13 12:06 PM      Result Value Range   WBC 9.0  4.0 - 10.5 K/uL   RBC 4.44  3.87 - 5.11 MIL/uL   Hemoglobin 13.8  12.0 - 15.0 g/dL   HCT 09.6  04.5 - 40.9 %   MCV 89.6  78.0 - 100.0 fL   MCH 31.1  26.0 - 34.0 pg   MCHC 34.7  30.0 - 36.0 g/dL   RDW 81.1  91.4 - 78.2 %   Platelets 208  150 - 400 K/uL   Neutrophils Relative % 85 (*) 43 - 77 %   Neutro Abs 7.7  1.7 - 7.7 K/uL   Lymphocytes Relative 11 (*) 12 - 46 %   Lymphs Abs 1.0  0.7 - 4.0 K/uL   Monocytes Relative 3  3 - 12 %   Monocytes Absolute 0.3  0.1 - 1.0 K/uL   Eosinophils Relative 0  0 - 5 %   Eosinophils Absolute 0.0  0.0 - 0.7 K/uL   Basophils Relative 0  0 - 1 %   Basophils Absolute 0.0  0.0 - 0.1 K/uL  TROPONIN I     Status: None   Collection Time    02/28/13 12:06 PM      Result Value Range   Troponin I <0.30  <0.30 ng/mL    Comment:            Due to the release kinetics of cTnI,     a negative result within the first hours     of the onset of symptoms does not rule out     myocardial infarction with certainty.     If myocardial infarction is still suspected,     repeat the test at appropriate intervals.    Radiological Exams on Admission: Dg Chest 2 View  02/28/2013   *RADIOLOGY REPORT*  Clinical Data: Dizziness  CHEST - 2 VIEW  Comparison: 02/04/2010  Findings: Cardiomediastinal silhouette is stable. No acute infiltrate or pulmonary edema.  Degenerative changes thoracic spine.  No pleural effusion.  IMPRESSION: No active disease.  No significant change.   Original Report Authenticated By: Natasha Mead, M.D.   Ct Head Wo Contrast  02/28/2013   *RADIOLOGY REPORT*  Clinical Data: Dizziness  CT HEAD WITHOUT CONTRAST  Technique:  Contiguous axial images were obtained from the base of the skull through the vertex without contrast.  Comparison: 02/27/2013  Findings: Again noted mucosal thickening left maxillary sinus.  No skull fracture.  No intracranial hemorrhage, mass effect or midline shift.  The mastoid air cells are unremarkable.  Stable mild cerebral atrophy.  Stable periventricular chronic white matter disease.  No acute infarction.  No mass lesion is noted on this unenhanced scan.  IMPRESSION: No acute intracranial abnormality.  Stable atrophy and chronic white matter disease.  No significant change.  The   Original Report Authenticated By: Natasha Mead, M.D.    Assessment/Plan Active Problems:   1. Vertigo: Patient presented with 2 days of severe vertigo, without acute URI symptoms to suggest vestibular neuronitis, or tinnitus. Brain imaging studies are negative for CVA or intracranial neuroma. Her long-standing history of intermittent vertigo, as well as exacerbation of symptoms with movement, associated with lateral nystagmus, appears indicative of benign positional vertigo. We shal continue scheduled Meclizine  and consult PT for vestibular rehab.  2. Sinusitis: Opacification left maxillary sinus was noted on MRI of 02/27/13, which may be responsible for post-nasal drip and patient's persistent cough. Will treat with a 7-day course of Levaquin, as well as Flonase nasal spray.  3. HTN (hypertension): BP appears to have been sub-optimally controlled in the last few days, despite compliance with antihypertensive medication. Will continue pre-admission antihypertensive, and adjust as indicated.   4. PAF (paroxysmal atrial fibrillation): Patient has know PAF on Xarelto. Currently in SR. Monitoring telemetrically.  5. Anxiety: Per patient, she had tried to "taper off" her Prozac over the  4 days from 02/23/13. We shall reinstate her regular dose, so as not to muddy the picture.   Further management will depend on clinical course.   Comment: Patient is FULL CODE.    Time Spent on Admission: 1 hour.   Iyla Balzarini,CHRISTOPHER 02/28/2013, 3:38 PM

## 2013-02-28 NOTE — Progress Notes (Signed)
UR completed 

## 2013-02-28 NOTE — Telephone Encounter (Signed)
Left message for pt to call.

## 2013-02-28 NOTE — Telephone Encounter (Signed)
Follow up ° ° °Pt returning your call °

## 2013-02-28 NOTE — ED Notes (Addendum)
Pt was seen here yesterday and was seen dx with vertigo and had a work up. Pt was to be seen at her md office today at 1145 and did not get the meclizine filled. Still having dizziness and did not take her htn medications ad bp elevated not c/o headache. Alert x4. cbg 160 pt states that her md thought it was due to she had been weaned from fluoxetine since Sunday and has been on it since dec.

## 2013-02-28 NOTE — ED Notes (Signed)
Pt. Is refusing to have blood drawn, nurse is notified.

## 2013-02-28 NOTE — ED Notes (Signed)
Unable to obtain blood specimen, pt getting a CT scan.

## 2013-03-01 LAB — CBC
Hemoglobin: 13.5 g/dL (ref 12.0–15.0)
MCHC: 33.3 g/dL (ref 30.0–36.0)
RBC: 4.52 MIL/uL (ref 3.87–5.11)
WBC: 6.7 10*3/uL (ref 4.0–10.5)

## 2013-03-01 LAB — URINE CULTURE

## 2013-03-01 LAB — BASIC METABOLIC PANEL
CO2: 24 mEq/L (ref 19–32)
Chloride: 104 mEq/L (ref 96–112)
GFR calc non Af Amer: 81 mL/min — ABNORMAL LOW (ref >90)
Glucose, Bld: 91 mg/dL (ref 70–99)
Potassium: 3.7 mEq/L (ref 3.5–5.1)
Sodium: 139 mEq/L (ref 135–145)

## 2013-03-01 MED ORDER — SODIUM CHLORIDE 0.9 % IV SOLN
INTRAVENOUS | Status: DC
  Administered 2013-03-01 – 2013-03-02 (×3): via INTRAVENOUS

## 2013-03-01 NOTE — Evaluation (Addendum)
Physical Therapy Evaluation Patient Details Name: Tiffany Black MRN: [id number] DOB: Jan 16, 1934 Today's Date: 03/01/2013 Time: 6213-0865 PT Time Calculation (min): 36 min  PT Assessment / Plan / Recommendation Clinical Impression  Pt presents with 1 week onset of extreme dizziness believed to be vestibular neuronitis.  Performed smooth persuits with no noted saccades, however did note that left eye has increased difficulty following and was slower to respond to direction changes.  Some slight nystagmus noted in L eye with smooth persuits.  Pt unable to perform head thrust or positional testing due to increased pain and spondylosis in cervical and lumbar spine.  Did not notice any nystagmus when getting to EOB from supine position and attempted to provide [facility] exercises for habituation, however unable to perform due to pain in low back.  When had pt perform turning head right and left, she states this increases dizziness, however it is less of room spinning.  Assessed pts ambulation with and without AD and note that she is more unsteady without AD, however still mild to moderately unsteady with RW.  PT did provide compensatory education for when pt making turns, as she performed it very fast in hallway.  Pt will benefit from skilled PT in acute venue to address deficits.  PT recommends CIR for follow up at D/C to maximize pts safety and function.     PT Assessment  Patient needs continued PT services    Follow Up Recommendations  CIR    Does the patient have the potential to tolerate intense rehabilitation      Barriers to Discharge Other (comment) Pts husband able to assist during the day, however unable at night as he takes sleeping medication and does not wake easily.     Equipment Recommendations  Rolling walker with 5" wheels    Recommendations for Other Services OT consult   Frequency Min 4X/week    Precautions / Restrictions Precautions Precautions: Fall Precaution  Comments: pt with cervical and lumbar spondylosis (per pt report) Restrictions Weight Bearing Restrictions: No   Pertinent Vitals/Pain Pt states some pain in back and neck at end of session      Mobility  Bed Mobility Bed Mobility: Supine to Sit;Sit to Supine Supine to Sit: 5: Supervision;HOB elevated Sit to Supine: 5: Supervision Details for Bed Mobility Assistance: Supervision for safety.  Transfers Transfers: Sit to Stand;Stand to Sit Sit to Stand: With upper extremity assist;From bed;4: Min assist Stand to Sit: 4: Min assist;With upper extremity assist;With armrests;To chair/3-in-1;To bed Details for Transfer Assistance: Performed x 2 in order to perform vestibular activity.  Min assist to rise and stabalize with cues for hand placement and safety.  Ambulation/Gait Ambulation/Gait Assistance: 3: Mod assist Ambulation Distance (Feet): 35 Feet (and another 10') Assistive device: None;Rolling walker Ambulation/Gait Assistance Details: Had pt ambulate to doorway without AD and noted pt to be very unsteady with wide BOS and pt reaching for objects to hold onto to stabalize.  Transitioned to RW for ambulation outside of room with stability improved, however still requiring at least min assist throughout to steady with cues for slow transitions (turning) as first turn she performed was very fast.   Gait Pattern: Step-through pattern;Decreased stride length;Wide base of support;Trunk flexed Gait velocity: decreased Stairs: No Wheelchair Mobility Wheelchair Mobility: No    Exercises     PT Diagnosis: Difficulty walking;Abnormality of gait;Generalized weakness;Acute pain  PT Problem List: Decreased strength;Decreased activity tolerance;Decreased balance;Decreased mobility;Decreased coordination;Decreased knowledge of use of DME;Decreased safety awareness;Decreased knowledge of precautions;Pain  PT Treatment Interventions: DME instruction;Gait training;Functional mobility  training;Therapeutic activities;Therapeutic exercise;Balance training;Neuromuscular re-education;Patient/family education   PT Goals Acute Rehab PT Goals PT Goal Formulation: With patient Time For Goal Achievement: 03/08/13 Potential to Achieve Goals: Good Pt will go Supine/Side to Sit: with modified independence;with HOB 0 degrees PT Goal: Supine/Side to Sit - Progress: Goal set today Pt will go Sit to Supine/Side: with modified independence;with HOB 0 degrees PT Goal: Sit to Supine/Side - Progress: Goal set today Pt will go Sit to Stand: with modified independence PT Goal: Sit to Stand - Progress: Goal set today Pt will go Stand to Sit: with modified independence PT Goal: Stand to Sit - Progress: Goal set today Pt will Ambulate: 51 - 150 feet;with supervision;with least restrictive assistive device PT Goal: Ambulate - Progress: Goal set today Pt will Perform Home Exercise Program: with supervision, verbal cues required/provided PT Goal: Perform Home Exercise Program - Progress: Goal set today  Visit Information  Last PT Received On: 03/01/13 Assistance Needed: +1    Subjective Data  Subjective: I think I just need my blood pressure pill and Meclizine and I will feel better Patient Stated Goal: to be able to get to bathroom on my own.    Prior Functioning  Home Living Lives With: Spouse Available Help at Discharge: Family;Available PRN/intermittently (husband takes sleeping pills at night and unable to assist) Type of Home: House Home Access: Level entry Home Layout: One level Bathroom Shower/Tub: Engineer, manufacturing systems: Standard Home Adaptive Equipment: Shower chair without back;Straight cane Prior Function Level of Independence: Needs assistance Needs Assistance: Light Housekeeping;Meal Prep Meal Prep: Moderate Light Housekeeping: Moderate Able to Take Stairs?: Yes Driving: Yes Vocation: Retired Comments: Pt states she has been unable to cook/clean for a  while due to orthopedic issues Communication Communication: No difficulties    Cognition  Cognition Arousal/Alertness: Awake/alert Behavior During Therapy: WFL for tasks assessed/performed Overall Cognitive Status: Within Functional Limits for tasks assessed    Extremity/Trunk Assessment Right Lower Extremity Assessment RLE ROM/Strength/Tone: Deficits RLE ROM/Strength/Tone Deficits: Pt with generalized weakness, grossly 3/5 per functional observation RLE Sensation: WFL - Light Touch Left Lower Extremity Assessment LLE ROM/Strength/Tone: Deficits LLE ROM/Strength/Tone Deficits: Pt with generalized weakness, grossly 3/5 per functional observation.  LLE Sensation: WFL - Light Touch Trunk Assessment Trunk Assessment: Kyphotic   Balance    End of Session PT - End of Session Equipment Utilized During Treatment: Gait belt Activity Tolerance: Other (comment) (limited by dizziness) Patient left: in chair;with call bell/phone within reach;with family/visitor present  GP Functional Assessment Tool Used: clinical judgement Functional Limitation: Mobility: Walking and moving around Mobility: Walking and Moving Around Current Status (Z6109): At least 20 percent but less than 40 percent impaired, limited or restricted Mobility: Walking and Moving Around Goal Status [phone]): At least 1 percent but less than 20 percent impaired, limited or restricted   Vista Deck 03/01/2013, 9:38 AM

## 2013-03-01 NOTE — Progress Notes (Addendum)
[facility] PROGRESS NOTE  Tiffany Black ZOX:[id number] DOB: 1933-09-18 DOA: 02/28/2013 PCP: Cala Bradford, MD  Assessment/Plan: 1. Vertigo -acute on chronic, BPPV vs labrynthirtis, I think significant anxiety too which could be contributing -Ct head and MRI negative for posterior circulation or brain stem ischemia -check Orthostatics -hold lasix -Pt/Ot eval  -continue meclizine TID  2. Sinusitis: noted on Ct with PND -cotninue Po levaquin for 5 days  3. HTN: -titrate anti-hypertensives pending orthostatics -continue plendil and ARB for now  4. Pafib: -rate controlled, continue labetalol and xarelto  5. Depression: -takes 5mg  of prozac, unfortunately only 10mg  available now, ok to bring 5mg  from home or hold for few days   Psychosocial factors, related to husbands illness  And decline likely contributing to some symptoms and anxiety   Code Status: FULL Family Communication:d/w pt and husband at bedside Disposition Plan: to be determined following Pt/OT eval   HPI/Subjective: Still feels dizzy, feels like she is getting weak for the last week  Objective: Filed Vitals:   03/01/13 0543 03/01/13 0835 03/01/13 0838 03/01/13 0840  BP: 165/82 155/73 163/86 129/84  Pulse: 72 75    Temp: 98.5 F (36.9 C) 98.1 F (36.7 C)    TempSrc: Oral Oral    Resp: 20     Height:      Weight:      SpO2: 95%       Intake/Output Summary (Last 24 hours) at 03/01/13 0844 Last data filed at 03/01/13 0700  Gross per 24 hour  Intake  552.5 ml  Output   1600 ml  Net -1047.5 ml   Filed Weights   02/28/13 1702  Weight: 74.8 kg (164 lb 14.5 oz)    Exam:   General:  Anxious  HEENT: mild tenderness in L neck  Cardiovascular: S1S2/RRR  Respiratory: CTAB  Abdomen: soft, Nt, BS present  Musculoskeletal: no edema c/c  Neuro: non focal, slight horizontal nystagmus noted  Data Reviewed: Basic Metabolic Panel:  Recent Labs Lab 02/27/13 1248 02/28/13 1206  03/01/13 0530  NA 138 137 139  K 3.7 3.4* 3.7  CL 101 101 104  CO2 27 24 24   GLUCOSE 105* 118* 91  BUN 13 9 10   CREATININE 0.73 0.61 0.70  CALCIUM 9.4 9.4 9.5   Liver Function Tests: No results found for this basename: AST, ALT, ALKPHOS, BILITOT, PROT, ALBUMIN,  in the last 168 hours No results found for this basename: LIPASE, AMYLASE,  in the last 168 hours No results found for this basename: AMMONIA,  in the last 168 hours CBC:  Recent Labs Lab 02/27/13 1248 02/28/13 1206 03/01/13 0530  WBC 7.4 9.0 6.7  NEUTROABS 5.2 7.7  --   HGB 13.2 13.8 13.5  HCT 38.9 39.8 40.6  MCV 90.7 89.6 89.8  PLT 211 208 206   Cardiac Enzymes:  Recent Labs Lab 02/28/13 1206  TROPONINI <0.30   BNP (last 3 results) No results found for this basename: PROBNP,  in the last 8760 hours CBG: No results found for this basename: GLUCAP,  in the last 168 hours  No results found for this or any previous visit (from the past 240 hour(s)).   Studies: Dg Chest 2 View  02/28/2013   *RADIOLOGY REPORT*  Clinical Data: Dizziness  CHEST - 2 VIEW  Comparison: 02/04/2010  Findings: Cardiomediastinal silhouette is stable. No acute infiltrate or pulmonary edema.  Degenerative changes thoracic spine.  No pleural effusion.  IMPRESSION: No active disease.  No significant change.  Original Report Authenticated By: Natasha Mead, M.D.   Ct Head Wo Contrast  02/28/2013   *RADIOLOGY REPORT*  Clinical Data: Dizziness  CT HEAD WITHOUT CONTRAST  Technique:  Contiguous axial images were obtained from the base of the skull through the vertex without contrast.  Comparison: 02/27/2013  Findings: Again noted mucosal thickening left maxillary sinus.  No skull fracture.  No intracranial hemorrhage, mass effect or midline shift.  The mastoid air cells are unremarkable.  Stable mild cerebral atrophy.  Stable periventricular chronic white matter disease.  No acute infarction.  No mass lesion is noted on this unenhanced scan.   IMPRESSION: No acute intracranial abnormality.  Stable atrophy and chronic white matter disease.  No significant change.  The   Original Report Authenticated By: Natasha Mead, M.D.   Ct Head Wo Contrast  02/27/2013   *RADIOLOGY REPORT*  Clinical Data: Intermittent dizziness left-side of head.  History of vertigo, worse than usual.  Hypertension and hyperlipidemia.  CT HEAD WITHOUT CONTRAST  Technique:  Contiguous axial images were obtained from the base of the skull through the vertex without contrast.  Comparison: 01/06/2011.  Findings: No intracranial hemorrhage.  Small vessel disease type changes without CT evidence of large acute infarct.  No intracranial mass lesion detected on this unenhanced exam.  No hydrocephalus.  Vascular calcifications.  Mastoid air cells, middle ear cavities and majority of visualized paranasal sinuses are clear with exception of mucosal thickening superior aspect of the left maxillary sinus.  IMPRESSION:  No intracranial hemorrhage.  Small vessel disease type changes without CT evidence of large acute infarct.  Mucosal thickening superior aspect left maxillary sinus.   Original Report Authenticated By: Lacy Duverney, M.D.   [name] Contrast  02/27/2013   *RADIOLOGY REPORT*  Clinical Data: Vertigo. Hypertension.  Hyperlipidemia.  MRI HEAD WITHOUT CONTRAST  Technique:  Multiplanar, multiecho pulse sequences of the brain and surrounding structures were obtained according to standard protocol without intravenous contrast.  Comparison: 02/27/2013 CT.  Findings: No acute infarct.  No intracranial hemorrhage.  Prominent small vessel disease type changes asymmetric greater on the left.  Other causes of white matter type changes felt to be less likely consideration.  Atrophy without hydrocephalus.  No intracranial mass lesion detected on this unenhanced exam.  Opacification left maxillary sinus which demonstrates restricted motion which may indicate bacterial infection versus presence  proteinaceous material. Minimal to mild mucosal thickening ethmoid sinus air cells.  Major intracranial vascular structures are patent.  Cervical medullary junction, pituitary region, pineal region and orbital structures unremarkable.  IMPRESSION: No acute infarct.  Prominent small vessel disease type changes asymmetric greater on the left.  Opacification left maxillary sinus which demonstrates restricted motion which may indicate bacterial infection versus presence proteinaceous material. Minimal to mild mucosal thickening ethmoid sinus air cells.   Original Report Authenticated By: Lacy Duverney, M.D.    Scheduled Meds: . cholecalciferol  1,000 Units Oral Daily  . felodipine  5 mg Oral Daily  . FLUoxetine  10 mg Oral Daily  . fluticasone  1 spray Each Nare Daily  . irbesartan  75 mg Oral BID  . labetalol  200 mg Oral BID  . levofloxacin  500 mg Oral Daily  . meclizine  25 mg Oral TID  . Rivaroxaban  20 mg Oral Daily   Continuous Infusions: . sodium chloride      Active Problems:   Vertigo   Sinusitis   HTN (hypertension)   Anxiety   PAF (paroxysmal atrial fibrillation)  Time spent:    [facility]  [facility] Pager [phone]. If 7PM-7AM, please contact night-coverage at [email or web address], password [facility] 03/01/2013, 8:44 AM  LOS: 1 day

## 2013-03-01 NOTE — Progress Notes (Signed)
Pt stated she does take 10mg  Prozac at home.

## 2013-03-02 DIAGNOSIS — H8110 Benign paroxysmal vertigo, unspecified ear: Secondary | ICD-10-CM

## 2013-03-02 NOTE — Progress Notes (Addendum)
[facility] PROGRESS NOTE  Tiffany Black WUJ:[id number] DOB: 1934-04-21 DOA: 02/28/2013 PCP: Cala Bradford, MD  Assessment/Plan: 1. ACute on chronic vertigo exacerbated by orthostatic hypotension -acute on chronic, BPPV with orthostatic hypotension contributing -Ct head and MRI negative for posterior circulation or brain stem ischemia -Orthostatics improved from yesterday, continue IVF today -hold lasix -Pt/Ot eval noted, CIR consult requested -continue meclizine TID  2. Sinusitis: noted on Ct with PND -cotninue Po levaquin for 5 days  3. HTN: -BP stable, improved -continue plendil and ARB for now  4. Pafib: -rate controlled, continue labetalol and xarelto  5. Depression: -continue prozac   Code Status: FULL Family Communication:d/w pt and husband at bedside Disposition Plan: CIR consult oending   HPI/Subjective: Dizziness improving, double vision intermittent and chronic  Objective: Filed Vitals:   03/02/13 0700 03/02/13 0759 03/02/13 0802 03/02/13 1300  BP: 156/72 150/81 134/72 157/71  Pulse: 65   63  Temp: 98.2 F (36.8 C)   98 F (36.7 C)  TempSrc: Oral   Oral  Resp: 16 16    Height:      Weight:      SpO2: 98% 98% 98% 99%    Intake/Output Summary (Last 24 hours) at 03/02/13 1535 Last data filed at 03/02/13 0944  Gross per 24 hour  Intake    840 ml  Output   1150 ml  Net   -310 ml   Filed Weights   02/28/13 1702  Weight: 74.8 kg (164 lb 14.5 oz)    Exam:   General:  Anxious  HEENT: mild tenderness in L neck  Cardiovascular: S1S2/RRR  Respiratory: CTAB  Abdomen: soft, Nt, BS present  Musculoskeletal: no edema c/c  Neuro: non focal, slight horizontal nystagmus noted  Data Reviewed: Basic Metabolic Panel:  Recent Labs Lab 02/27/13 1248 02/28/13 1206 03/01/13 0530  NA 138 137 139  K 3.7 3.4* 3.7  CL 101 101 104  CO2 27 24 24   GLUCOSE 105* 118* 91  BUN 13 9 10   CREATININE 0.73 0.61 0.70  CALCIUM 9.4 9.4 9.5   Liver  Function Tests: No results found for this basename: AST, ALT, ALKPHOS, BILITOT, PROT, ALBUMIN,  in the last 168 hours No results found for this basename: LIPASE, AMYLASE,  in the last 168 hours No results found for this basename: AMMONIA,  in the last 168 hours CBC:  Recent Labs Lab 02/27/13 1248 02/28/13 1206 03/01/13 0530  WBC 7.4 9.0 6.7  NEUTROABS 5.2 7.7  --   HGB 13.2 13.8 13.5  HCT 38.9 39.8 40.6  MCV 90.7 89.6 89.8  PLT 211 208 206   Cardiac Enzymes:  Recent Labs Lab 02/28/13 1206  TROPONINI <0.30   BNP (last 3 results) No results found for this basename: PROBNP,  in the last 8760 hours CBG: No results found for this basename: GLUCAP,  in the last 168 hours  Recent Results (from the past 240 hour(s))  URINE CULTURE     Status: None   Collection Time    02/28/13 11:36 AM      Result Value Range Status   Specimen Description URINE, CLEAN CATCH   Final   Special Requests NONE   Final   Culture  Setup Time 02/28/2013 14:07   Final   Colony Count 25,000 COLONIES/ML   Final   Culture     Final   Value: Multiple bacterial morphotypes present, none predominant. Suggest appropriate recollection if clinically indicated.   Report Status 03/01/2013 FINAL   Final  Studies: No results found.  Scheduled Meds: . cholecalciferol  1,000 Units Oral Daily  . felodipine  5 mg Oral Daily  . FLUoxetine  10 mg Oral Daily  . fluticasone  1 spray Each Nare Daily  . irbesartan  75 mg Oral BID  . labetalol  200 mg Oral BID  . levofloxacin  500 mg Oral Daily  . meclizine  25 mg Oral TID  . Rivaroxaban  20 mg Oral Daily   Continuous Infusions: . sodium chloride 75 mL/hr at 03/02/13 1131    Active Problems:   Vertigo   Sinusitis   HTN (hypertension)   Anxiety   PAF (paroxysmal atrial fibrillation)    Time spent:    [facility]  [facility] Pager [phone]. If 7PM-7AM, please contact night-coverage at [email or web address], password [facility] 03/02/2013, 3:35  PM  LOS: 2 days

## 2013-03-02 NOTE — Evaluation (Signed)
Occupational Therapy Evaluation Patient Details Name: Clista Rainford MRN: [id number] DOB: February 22, 1934 Today's Date: 03/02/2013 Time: 0802-0827 OT Time Calculation (min): 25 min  OT Assessment / Plan / Recommendation Clinical Impression  Pt presents with 1 week onset of extreme dizziness believed to be vestibular neuronitis. Pt with increased I today and less dizziness.  Pt concerned husband can not care for her.  Pt will benefit from skilled OT to increase I with ADL activity and return to PLOF    OT Assessment  Patient needs continued OT Services    Follow Up Recommendations  SNF;CIR;Home health OT;Other (comment);Supervision/Assistance - 24 hour (depending on progress)    Barriers to Discharge Decreased caregiver support    Equipment Recommendations  None recommended by OT       Frequency  Min 3X/week           ADL  Grooming: Performed;Wash/dry face;Teeth care;Wash/dry hands;Denture care;Brushing hair;Minimal assistance Where Assessed - Grooming: Unsupported standing Toilet Transfer: Performed;Minimal assistance Toilet Transfer Method: Sit to Barista: Comfort height toilet Toileting - Clothing Manipulation and Hygiene: Performed;Minimal assistance Where Assessed - Engineer, mining and Hygiene: Standing Transfers/Ambulation Related to ADLs: Pt used walker for transfers to bathroom and back in room    OT Diagnosis: Generalized weakness;Disturbance of vision  OT Problem List: Decreased strength;Decreased activity tolerance;Impaired balance (sitting and/or standing) OT Treatment Interventions: Self-care/ADL training;Visual/perceptual remediation/compensation;Patient/family education   OT Goals Acute Rehab OT Goals OT Goal Formulation: With patient Time For Goal Achievement: 03/16/13 ADL Goals Pt Will Perform Grooming: with modified independence;Standing at sink ADL Goal: Grooming - Progress: Goal set today Pt Will Perform Upper Body  Dressing: with modified independence;Sit to stand from chair ADL Goal: Upper Body Dressing - Progress: Goal set today Pt Will Perform Lower Body Dressing: with modified independence;Sit to stand from chair ADL Goal: Lower Body Dressing - Progress: Goal set today Pt Will Transfer to Toilet: with modified independence;Comfort height toilet ADL Goal: Toilet Transfer - Progress: Goal set today Pt Will Perform Toileting - Clothing Manipulation: with modified independence;Standing ADL Goal: Toileting - Clothing Manipulation - Progress: Goal set today Miscellaneous OT Goals Miscellaneous OT Goal #1: Pt will demonstrate ability to focus on stable object during episodes of double vision I ly to increase I and safety with ADL activity OT Goal: Miscellaneous Goal #1 - Progress: Goal set today  Visit Information  Last OT Received On: 03/02/13 Assistance Needed: +1    Subjective Data  Subjective: I was seeing double this morning - but I am not now   Prior Functioning     Home Living Lives With: Spouse Available Help at Discharge: Family;Available PRN/intermittently (husband takes sleeping pills at night and unable to assist) Type of Home: House Home Access: Level entry Home Layout: One level Bathroom Shower/Tub: Engineer, manufacturing systems: Standard Home Adaptive Equipment: Shower chair without back;Straight cane Prior Function Level of Independence: Needs assistance Needs Assistance: Light Housekeeping;Meal Prep Meal Prep: Moderate Light Housekeeping: Moderate Able to Take Stairs?: Yes Driving: Yes Vocation: Retired Musician: No difficulties         Vision/Perception Vision - History Baseline Vision: Wears glasses only for reading Vision - Assessment Additional Comments: Pt did report double vision this morning, but with focusing pt able to see single.  Pt did not have double vision during OT eval   Cognition  Cognition Arousal/Alertness:  Awake/alert Behavior During Therapy: WFL for tasks assessed/performed Overall Cognitive Status: Within Functional Limits for tasks assessed    Extremity/Trunk  Assessment Right Upper Extremity Assessment RUE ROM/Strength/Tone: [facility] for tasks assessed Left Upper Extremity Assessment LUE ROM/Strength/Tone: [facility] for tasks assessed     Mobility Bed Mobility Bed Mobility: Supine to Sit;Sit to Supine Supine to Sit: 5: Supervision;HOB elevated Sit to Supine: 5: Supervision Details for Bed Mobility Assistance: Supervision for safety.  Transfers Sit to Stand: With upper extremity assist;From bed;5: Supervision;From toilet;From chair/3-in-1 Stand to Sit: With upper extremity assist;With armrests;To chair/3-in-1;5: Supervision;To toilet           End of Session OT - End of Session Activity Tolerance: Patient tolerated treatment well Patient left: in chair;with call bell/phone within reach  GO Functional Assessment Tool Used: clinical observation Functional Limitation: Self care Self Care Current Status (Z6109): At least 20 percent but less than 40 percent impaired, limited or restricted Self Care Goal Status (U0454): At least 1 percent but less than 20 percent impaired, limited or restricted   Yvetta Drotar, [place] 03/02/2013, 9:08 AM

## 2013-03-02 NOTE — Progress Notes (Signed)
Physical Therapy Treatment Patient Details Name: Tiffany Black MRN: [id number] DOB: March 28, 1934 Today's Date: 03/02/2013 Time: 0802-0828 PT Time Calculation (min): 26 min  PT Assessment / Plan / Recommendation Comments on Treatment Session  Pt making progression with ambulation distance, however continues to be very unsteady, esp with any head/eye movements.      Follow Up Recommendations  CIR     Does the patient have the potential to tolerate intense rehabilitation     Barriers to Discharge        Equipment Recommendations  Rolling walker with 5" wheels    Recommendations for Other Services    Frequency Min 4X/week   Plan Discharge plan remains appropriate    Precautions / Restrictions Precautions Precautions: Fall Precaution Comments: pt with cervical and lumbar spondylosis (per pt report)   Pertinent Vitals/Pain No pain    Mobility  Bed Mobility Bed Mobility: Not assessed (pt on EOB when PT/OT arrived. ) Supine to Sit: 5: Supervision;HOB elevated Sit to Supine: 5: Supervision Details for Bed Mobility Assistance: Supervision for safety.  Transfers Transfers: Sit to Stand;Stand to Sit Sit to Stand: 5: Supervision;With upper extremity assist;From bed;From toilet Stand to Sit: 5: Supervision;With upper extremity assist;With armrests;To toilet;To chair/3-in-1 Details for Transfer Assistance: Performed several times to commode and chair at supervision level for safety.  Ambulation/Gait Ambulation/Gait Assistance: 4: Min assist Ambulation Distance (Feet): 185 Feet Assistive device: Rolling walker Ambulation/Gait Assistance Details: Min assist to steady throughout, however did note improvement today vs yesterday.  Continue to provide cues for slower turns, but improved from yesterday.  Also had pt perform horizontal head turns today with ambulation and pt demonstrated decreased balance and stability and also decreased gait speed, increasing risk for falls.  Gait Pattern:  Step-through pattern;Decreased stride length;Wide base of support;Trunk flexed Gait velocity: decreased    Exercises     PT Diagnosis:    PT Problem List:   PT Treatment Interventions:     PT Goals Acute Rehab PT Goals PT Goal Formulation: With patient Time For Goal Achievement: 03/08/13 Potential to Achieve Goals: Good Pt will go Sit to Stand: with modified independence PT Goal: Sit to Stand - Progress: Progressing toward goal Pt will go Stand to Sit: with modified independence PT Goal: Stand to Sit - Progress: Progressing toward goal Pt will Ambulate: 51 - 150 feet;with supervision;with least restrictive assistive device PT Goal: Ambulate - Progress: Progressing toward goal  Visit Information  Last PT Received On: 03/02/13 Assistance Needed: +1    Subjective Data  Subjective: I really want to wash my hair.  I look awful.    Cognition  Cognition Arousal/Alertness: Awake/alert Behavior During Therapy: WFL for tasks assessed/performed Overall Cognitive Status: Within Functional Limits for tasks assessed    Balance  Balance Balance Assessed: Yes Dynamic Standing Balance Dynamic Standing - Balance Support: Left upper extremity supported;During functional activity Dynamic Standing - Level of Assistance: 4: Min assist;5: Stand by assistance Dynamic Standing - Balance Activities: Lateral lean/weight shifting;Forward lean/weight shifting Dynamic Standing - Comments: Pt able to stand at sink for approx 5 mins performing self care activities with intermittent min assist due to LOB.   End of Session PT - End of Session Equipment Utilized During Treatment: Gait belt Activity Tolerance: Other (comment) (some dizziness. ) Patient left: in chair;with call bell/phone within reach;with family/visitor present Nurse Communication: Mobility status   GP     Vista Deck 03/02/2013, 9:17 AM

## 2013-03-03 DIAGNOSIS — F411 Generalized anxiety disorder: Secondary | ICD-10-CM

## 2013-03-03 DIAGNOSIS — H8110 Benign paroxysmal vertigo, unspecified ear: Secondary | ICD-10-CM

## 2013-03-03 DIAGNOSIS — I10 Essential (primary) hypertension: Secondary | ICD-10-CM

## 2013-03-03 NOTE — Consult Note (Signed)
Physical Medicine and Rehabilitation Consult Reason for Consult: Deconditioning/vertigo Referring Physician: [facility]   HPI: Tiffany Black is a 79 y.o. right-handed female with history of hypertension, PAF on Xarelto and persistent vertigo. By report patient has had episodic vertigo for the past number of years and has used meclizine as needed. Admitted 02/28/2013 with severe dizziness and vertigo over the last 2 days exacerbated by movement. Patient was seen in the ED on 02/27/2013 with head CT scan and brain MRI negative for acute findings patient was sent home with prescription for meclizine. Followup cranial CT scan 02/28/2013 again negative for acute changes. Noted bouts of orthostatic blood pressure changes and responded to intravenous fluids. Placed on Levaquin x5 days for suspect sinusitis noted on CT scan. Physical and occupational therapy evaluations completed an ongoing with recommendations of physical medicine rehabilitation consult to consider inpatient rehabilitation services   Review of Systems  Eyes: Positive for blurred vision and double vision.  Cardiovascular: Positive for palpitations.  Musculoskeletal: Positive for myalgias.  Neurological: Positive for dizziness.       Vertigo  Psychiatric/Behavioral: Positive for depression.  All other systems reviewed and are negative.   Past Medical History  Diagnosis Date  . Hypertension   . Arthritis   . 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 Endometrial ca   . Anxiety   . HLD (hyperlipidemia)   . Cardiomegaly   . Pectus excavatum   . OA (osteoarthritis)   . Vertigo    Past Surgical History  Procedure Laterality Date  . Cesarean section    . Laparoscopic hysterectomy  2011  . Abdominal hysterectomy    . Cataract extraction, bilateral     Family History  Problem Relation Age of Onset  . Heart attack  Father   . Heart disease Father    Social History:  reports that she has never smoked. She has never used smokeless tobacco. She reports that she does not drink alcohol or use illicit drugs. Allergies:  Allergies  Allergen Reactions  . Carbamazepine Other (See Comments)    unknown  . Codeine Nausea And Vomiting  . Hydrochlorothiazide Other (See Comments)    unknown  . Penicillins Hives  . Sulfonamide Derivatives Hives   Medications Prior to Admission  Medication Sig Dispense Refill  . cholecalciferol (VITAMIN D) 1000 UNITS tablet Take 1,000 Units by mouth daily.      . felodipine (PLENDIL) 5 MG 24 hr tablet Take 5 mg by mouth daily.       Marland Kitchen FLUoxetine (PROZAC) 10 MG tablet Take 5 mg by mouth daily.       . furosemide (LASIX) 20 MG tablet Take 20 mg by mouth daily.       Marland Kitchen labetalol (NORMODYNE) 200 MG tablet Take 200 mg by mouth 2 (two) times daily.      . meclizine (ANTIVERT) 25 MG tablet Take 25 mg by mouth 3 (three) times daily as needed. Dizziness      . Polyethyl Glycol-Propyl Glycol (SYSTANE) 0.4-0.3 % SOLN Place 1 drop into both eyes daily as needed (for dry eyes).      . potassium chloride (KLOR-CON) 10 MEQ CR tablet Take 10 mEq by mouth daily.       . Rivaroxaban (XARELTO) 20 MG TABS Take 1 tablet (20 mg total) by mouth daily.  30 tablet  6  . valsartan (DIOVAN) 80 MG  tablet Take 80 mg by mouth 2 (two) times daily.       Marland Kitchen zolpidem (AMBIEN) 5 MG tablet Take 5 mg by mouth at bedtime as needed. For sleep        Home: Home Living Lives With: Spouse Available Help at Discharge: Family;Available PRN/intermittently (husband takes sleeping pills at night and unable to assist) Type of Home: House Home Access: Level entry Home Layout: One level Bathroom Shower/Tub: Engineer, manufacturing systems: Standard Home Adaptive Equipment: Shower chair without back;Straight cane  Functional History: Prior Function Meal Prep: Moderate Light Housekeeping: Moderate Able to Take  Stairs?: Yes Driving: Yes Vocation: Retired Comments: Pt states she has been unable to cook/clean for a while due to orthopedic issues Functional Status:  Mobility: Bed Mobility Bed Mobility: Not assessed (pt on EOB when PT/OT arrived. ) Supine to Sit: 5: Supervision;HOB elevated Sit to Supine: 5: Supervision Transfers Transfers: Sit to Stand;Stand to Sit Sit to Stand: 5: Supervision;With upper extremity assist;From bed;From toilet Stand to Sit: 5: Supervision;With upper extremity assist;With armrests;To toilet;To chair/3-in-1 Ambulation/Gait Ambulation/Gait Assistance: 4: Min assist Ambulation Distance (Feet): 185 Feet Assistive device: Rolling walker Ambulation/Gait Assistance Details: Min assist to steady throughout, however did note improvement today vs yesterday.  Continue to provide cues for slower turns, but improved from yesterday.  Also had pt perform horizontal head turns today with ambulation and pt demonstrated decreased balance and stability and also decreased gait speed, increasing risk for falls.  Gait Pattern: Step-through pattern;Decreased stride length;Wide base of support;Trunk flexed Gait velocity: decreased Stairs: No Wheelchair Mobility Wheelchair Mobility: No  ADL: ADL Grooming: Performed;Wash/dry face;Teeth care;Wash/dry hands;Denture care;Brushing hair;Minimal assistance Where Assessed - Grooming: Unsupported standing Toilet Transfer: Performed;Minimal assistance Toilet Transfer Method: Sit to Barista: Comfort height toilet Transfers/Ambulation Related to ADLs: Pt used walker for transfers to bathroom and back in room  Cognition: Cognition Overall Cognitive Status: Within Functional Limits for tasks assessed Arousal/Alertness: Awake/alert Orientation Level: Oriented X4 Cognition Arousal/Alertness: Awake/alert Behavior During Therapy: WFL for tasks assessed/performed Overall Cognitive Status: Within Functional Limits for tasks  assessed  Blood pressure 143/64, pulse 64, temperature 98.2 F (36.8 C), temperature source Oral, resp. rate 16, height 5\' 5"  (1.651 m), weight 74.8 kg (164 lb 14.5 oz), SpO2 96.00%. Physical Exam  Vitals reviewed. Constitutional: She is oriented to person, place, and time.  HENT:  Head: Normocephalic.  Eyes:  Pupils round and reactive to light. Negative nystagmus  Neck: Normal range of motion. Neck supple. No thyromegaly present.  Cardiovascular:  Cardiac rate controlled  Pulmonary/Chest: Effort normal and breath sounds normal. No respiratory distress.  Abdominal: Soft. Bowel sounds are normal. She exhibits no distension.  Musculoskeletal: She exhibits no edema.  Neurological: She is alert and oriented to person, place, and time.  Mood is flat but appropriate. Mild decreased fine motor skills/ Nystagmus with tracking laterally to either side, perhaps a little more to the right. I was unable to ellicit any further symptoms with manipulation of her head,eyes, neck. Cognitively she was generally appropriate. CN exam intact. Strength near 5/5. Sensory exam normal.  Skin: Skin is warm and dry.  Psychiatric:  Very anxious, quickly jumps from topic to topic. Had to redirect several times.     No results found for this or any previous visit (from the past 24 hour(s)). No results found.  Assessment/Plan: Diagnosis: BPPV, exacerbated by orthostatic hypotension and likely anxiety 1. Does the need for close, 24 hr/day medical supervision in concert with the patient's rehab  needs make it unreasonable for this patient to be served in a less intensive setting? No 2. Co-Morbidities requiring supervision/potential complications: see above 3. Due to bladder management, bowel management, safety, skin/wound care and disease management, does the patient require 24 hr/day rehab nursing? No 4. Does the patient require coordinated care of a physician, rehab nurse, PT, OT to address physical and functional  deficits in the context of the above medical diagnosis(es)? No Addressing deficits in the following areas: balance, locomotion, strength, bowel/bladder control, dressing, feeding and cognition 5. Can the patient actively participate in an intensive therapy program of at least 3 hrs of therapy per day at least 5 days per week? Potentially 6. The potential for patient to make measurable gains while on inpatient rehab is fair 7. Anticipated functional outcomes upon discharge from inpatient rehab are n/a with PT, n/a with OT, n/a with SLP. 8. Estimated rehab length of stay to reach the above functional goals is: n/a 9. Does the patient have adequate social supports to accommodate these discharge functional goals? Potentially 10. Anticipated D/C setting: Home 11. Anticipated post D/C treatments: HH therapy 12. Overall Rehab/Functional Prognosis: good  RECOMMENDATIONS: This patient's condition is appropriate for continued rehabilitative care in the following setting: Acute ----->Outpt vestibular rehab would be the most appropriate Patient has agreed to participate in recommended program. Potentially Note that insurance prior authorization may be required for reimbursement for recommended.  Comment: Does not meet medical necessity for CIR. Would address vestibular issues with therapy on acute and on an outpatient basis. If family can't provide for her care needs, then they need to pursue SNF.             Ranelle Oyster, MD, [facility] Physical Medicine & Rehabilitation     03/03/2013

## 2013-03-03 NOTE — Progress Notes (Signed)
[facility] PROGRESS NOTE  Tiffany Black AVW:[id number] DOB: [birth year] DOA: 02/28/2013 PCP: Cala Bradford, MD  Assessment/Plan: 1. ACute on chronic vertigo exacerbated by orthostatic hypotension -acute on chronic, BPPV with orthostatic hypotension contributing -Ct head and MRI negative for posterior circulation or brain stem ischemia -Orthostatics improved from yesterday, stop IVF today -hold lasix -Pt/Ot eval noted, CIR consult requested -continue meclizine TID  2. Sinusitis: noted on Ct with PND -cotninue Po levaquin for 5 days  3. HTN: -BP stable, improved -continue plendil and ARB for now  4. Pafib: -rate controlled, continue labetalol and xarelto  5. Depression: -continue prozac  Code Status: FULL Family Communication:d/w pt and husband at bedside Disposition Plan: CIR consult noted, D/w [name] not appropriate for CIR   HPI/Subjective: Dizziness improving, double vision intermittent and chronic  Objective: Filed Vitals:   03/03/13 0544 03/03/13 1105 03/03/13 1108 03/03/13 1111  BP: 143/64 155/84 148/83 119/67  Pulse: 64 67 70 84  Temp: 98.2 F (36.8 C)     TempSrc: Oral     Resp: 16     Height:      Weight:      SpO2: 96%       Intake/Output Summary (Last 24 hours) at 03/03/13 1141 Last data filed at 03/03/13 1025  Gross per 24 hour  Intake   1920 ml  Output   3750 ml  Net  -1830 ml   Filed Weights   02/28/13 1702  Weight: 74.8 kg (164 lb 14.5 oz)    Exam:   General:  Anxious  HEENT: mild tenderness in L neck  Cardiovascular: S1S2/RRR  Respiratory: CTAB  Abdomen: soft, Nt, BS present  Musculoskeletal: no edema c/c  Neuro: non focal, slight horizontal nystagmus noted  Data Reviewed: Basic Metabolic Panel:  Recent Labs Lab 02/27/13 1248 02/28/13 1206 03/01/13 0530  NA 138 137 139  K 3.7 3.4* 3.7  CL 101 101 104  CO2 27 24 24   GLUCOSE 105* 118* 91  BUN 13 9 10   CREATININE 0.73 0.61 0.70  CALCIUM 9.4 9.4 9.5    Liver Function Tests: No results found for this basename: AST, ALT, ALKPHOS, BILITOT, PROT, ALBUMIN,  in the last 168 hours No results found for this basename: LIPASE, AMYLASE,  in the last 168 hours No results found for this basename: AMMONIA,  in the last 168 hours CBC:  Recent Labs Lab 02/27/13 1248 02/28/13 1206 03/01/13 0530  WBC 7.4 9.0 6.7  NEUTROABS 5.2 7.7  --   HGB 13.2 13.8 13.5  HCT 38.9 39.8 40.6  MCV 90.7 89.6 89.8  PLT 211 208 206   Cardiac Enzymes:  Recent Labs Lab 02/28/13 1206  TROPONINI <0.30   BNP (last 3 results) No results found for this basename: PROBNP,  in the last 8760 hours CBG: No results found for this basename: GLUCAP,  in the last 168 hours  Recent Results (from the past 240 hour(s))  URINE CULTURE     Status: None   Collection Time    02/28/13 11:36 AM      Result Value Range Status   Specimen Description URINE, CLEAN CATCH   Final   Special Requests NONE   Final   Culture  Setup Time 02/28/2013 14:07   Final   Colony Count 25,000 COLONIES/ML   Final   Culture     Final   Value: Multiple bacterial morphotypes present, none predominant. Suggest appropriate recollection if clinically indicated.   Report Status 03/01/2013 FINAL   Final  Studies: No results found.  Scheduled Meds: . cholecalciferol  1,000 Units Oral Daily  . felodipine  5 mg Oral Daily  . FLUoxetine  10 mg Oral Daily  . fluticasone  1 spray Each Nare Daily  . irbesartan  75 mg Oral BID  . labetalol  200 mg Oral BID  . levofloxacin  500 mg Oral Daily  . meclizine  25 mg Oral TID  . Rivaroxaban  20 mg Oral Daily   Continuous Infusions:    Active Problems:   Vertigo   Sinusitis   HTN (hypertension)   Anxiety   PAF (paroxysmal atrial fibrillation)    Time spent:    [facility]  [facility] Pager [phone]. If 7PM-7AM, please contact night-coverage at [email or web address], password [facility] 03/03/2013, 11:41 AM  LOS: 3 days

## 2013-03-03 NOTE — Clinical Social Work Psychosocial (Unsigned)
     Clinical Social Work Department BRIEF PSYCHOSOCIAL ASSESSMENT 03/03/2013  Patient:  Tiffany Black, Tiffany Black     Account Number:  [id number]     Admit date:  02/28/2013  Clinical Social Worker:  Hattie Perch  Date/Time:  03/03/2013 12:00 M  Referred by:  Physician  Date Referred:  03/03/2013 Referred for  SNF Placement   Other Referral:   Interview type:  Patient Other interview type:    PSYCHOSOCIAL DATA Living Status:  FAMILY Admitted from facility:   Level of care:   Primary support name:  Lurline Hare Primary support relationship to patient:  SPOUSE Degree of support available:   good    CURRENT CONCERNS Current Concerns  Post-Acute Placement   Other Concerns:    SOCIAL WORK ASSESSMENT / PLAN CSW met with patient. patient is alert and oriented X3. patient in need of snf placement and was declined by CIR. patient discussed at length her previous experience with her husband regarding social workers and snf placement. CSW offered emotional support and listened while patient vented. Patient's son works at [employer] which she knows she would have to private pay but tells CSW that they do not have a spot for her. after lengthy discussion, she is agreeable to being faxed out.   Assessment/plan status:   Other assessment/ plan:   Information/referral to community resources:    PATIENTS/FAMILYS RESPONSE TO PLAN OF CARE: patient reluctantly agreeable to being faxed out. she reiterated several times that she prefers to stay in the hospital.

## 2013-03-03 NOTE — Progress Notes (Signed)
Outpatient rehab is recommended. Noted Dr. Jomarie Longs is aware. [phone]

## 2013-03-03 NOTE — Progress Notes (Signed)
Physical Therapy Treatment Patient Details Name: Tiffany Black MRN: [id number] DOB: 10/25/1933 Today's Date: 03/03/2013 Time: 7829-5621 PT Time Calculation (min): 13 min  PT Assessment / Plan / Recommendation Comments on Treatment Session  Pt very limited today by increased dizziness.  Note that she was able to move head in restroom and not have any c/o dizziness, however when put in busy environment of hallway, pt unable to focus and dizziness increases.     Follow Up Recommendations  CIR     Does the patient have the potential to tolerate intense rehabilitation     Barriers to Discharge        Equipment Recommendations  Rolling walker with 5" wheels    Recommendations for Other Services    Frequency Min 4X/week   Plan Discharge plan remains appropriate    Precautions / Restrictions Precautions Precautions: Fall Precaution Comments: pt with cervical and lumbar spondylosis (per pt report) Restrictions Weight Bearing Restrictions: No   Pertinent Vitals/Pain No stated pain    Mobility  Bed Mobility Bed Mobility: Not assessed Transfers Transfers: Sit to Stand;Stand to Sit Sit to Stand: 5: Supervision;With upper extremity assist;From chair/3-in-1 Stand to Sit: To toilet;5: Supervision;With upper extremity assist;To chair/3-in-1 Details for Transfer Assistance: Supervision for safety with cues for hand placement and keeping RW with her until all the way at seating surface.  Ambulation/Gait Ambulation/Gait Assistance: 4: Min assist Ambulation Distance (Feet): 50 Feet (and another 15') Assistive device: Rolling walker Ambulation/Gait Assistance Details: Pt continues to demonstrate moderate instability with ambulation and was limited due to increased dizziness.  MAX cues for performing turns slowly, as she demonstrates increased anxiety and turns too fast.  Gait Pattern: Step-through pattern;Decreased stride length;Wide base of support;Trunk flexed Gait velocity: decreased     Exercises     PT Diagnosis:    PT Problem List:   PT Treatment Interventions:     PT Goals Acute Rehab PT Goals PT Goal Formulation: With patient Time For Goal Achievement: 03/08/13 Potential to Achieve Goals: Good Pt will go Sit to Stand: with modified independence PT Goal: Sit to Stand - Progress: Progressing toward goal Pt will go Stand to Sit: with modified independence PT Goal: Stand to Sit - Progress: Progressing toward goal Pt will Ambulate: 51 - 150 feet;with supervision;with least restrictive assistive device PT Goal: Ambulate - Progress: Progressing toward goal  Visit Information  Last PT Received On: 03/03/13 Assistance Needed: +1    Subjective Data  Subjective: I haven't had my meds yet, but I will try Patient Stated Goal: to return home eventually   Cognition  Cognition Arousal/Alertness: Awake/alert Behavior During Therapy: Anxious Overall Cognitive Status: Within Functional Limits for tasks assessed    Balance  Balance Balance Assessed: Yes Dynamic Standing Balance Dynamic Standing - Balance Support: No upper extremity supported;During functional activity Dynamic Standing - Level of Assistance: 4: Min assist Dynamic Standing - Comments: Pt attempted to use BUEs to tie gown and had LOB, requiring assist to steady.   End of Session PT - End of Session Equipment Utilized During Treatment: Gait belt Activity Tolerance: Other (comment) (Limited by dizziness) Patient left: in chair;with call bell/phone within reach;with nursing in room   GP     Vista Deck 03/03/2013, 10:20 AM

## 2013-03-03 NOTE — Progress Notes (Signed)
Occupational Therapy Treatment Patient Details Name: Tiffany Black MRN: [id number] DOB: 04/18/1934 Today's Date: 03/03/2013 Time: 7829-5621 OT Time Calculation (min): 12 min  OT Assessment / Plan / Recommendation Comments on Treatment Session Pt did well with ADL task- standing at sink, brushing teeth, donning shoes- but then on walk with OT and PT pt stated she felt wobbly and wanted to return to room                   Plan Other (comment) (depending on care at home)    Precautions / Restrictions Precautions Precautions: Fall Precaution Comments: pt with cervical and lumbar spondylosis (per pt report)       ADL  Grooming: Performed;Wash/dry hands;Wash/dry face;Teeth care;Denture care;Supervision/safety Where Assessed - Grooming: Unsupported standing Lower Body Dressing: Performed;Supervision/safety Where Assessed - Lower Body Dressing: Unsupported sit to stand Toilet Transfer: Performed;Supervision/safety Toilet Transfer Method: Sit to Barista: Comfort height toilet Toileting - Clothing Manipulation and Hygiene: Performed;Supervision/safety Where Assessed - Engineer, mining and Hygiene: Sit to stand from 3-in-1 or toilet Transfers/Ambulation Related to ADLs: Verbal cues to slow down turns- specifically in the bathroom.     OT Goals Acute Rehab OT Goals OT Goal Formulation: With patient ADL Goals ADL Goal: Grooming - Progress: Progressing toward goals ADL Goal: Lower Body Dressing - Progress: Progressing toward goals ADL Goal: Toilet Transfer - Progress: Progressing toward goals ADL Goal: Toileting - Clothing Manipulation - Progress: Progressing toward goals  Visit Information  Last OT Received On: 03/03/13    Subjective Data  Subjective: I just feel like I am wobbly.  I have been to [facility] and have been chasing this for years   Prior Functioning       Cognition  Cognition Arousal/Alertness: Awake/alert Behavior During  Therapy: WFL for tasks assessed/performed Overall Cognitive Status: Within Functional Limits for tasks assessed    Mobility  Transfers Transfers: Sit to Stand;Stand to Sit Sit to Stand: 5: Supervision;With upper extremity assist;From chair/3-in-1 Stand to Sit: To toilet;5: Supervision;With upper extremity assist          End of Session OT - End of Session Activity Tolerance: Patient tolerated treatment well Patient left: in chair Nurse Communication: Mobility status Pt would benefit from OP vestibular- but pt concerned she will not be able to manage at home.  GO Functional Assessment Tool Used: clinical observation Functional Limitation: Self care Self Care Current Status (H0865): At least 1 percent but less than 20 percent impaired, limited or restricted Self Care Goal Status (H8469): At least 1 percent but less than 20 percent impaired, limited or restricted   Alwaleed Obeso, [place] Kung 03/03/2013, 10:18 AM

## 2013-03-03 NOTE — Progress Notes (Signed)
Nutrition Brief Note  Patient identified on the Malnutrition Screening Tool (MST) Report  Body mass index is 27.44 kg/(m^2). Patient meets criteria for overweight based on current BMI.   Current diet order is heart healthy, patient is consuming approximately 100% of meals at this time. Labs and medications reviewed. Pt admitted with severe dizziness and being unable to walk. Met with pt who reports good appetite PTA, eating 3 meals/day. Pt reports excellent intake since admission. Pt reports she may have a lost a few pounds recently due to taking stool softeners making her go to the bathroom more, but she has been eating well.   No nutrition interventions warranted at this time. If nutrition issues arise, please consult RD.   Levon Hedger MS, RD, LDN [phone] Pager [phone] After Hours Pager

## 2013-03-04 DIAGNOSIS — H8110 Benign paroxysmal vertigo, unspecified ear: Secondary | ICD-10-CM | POA: Diagnosis present

## 2013-03-04 MED ORDER — LEVOFLOXACIN 500 MG PO TABS: 500.0000 mg | ORAL_TABLET | Freq: Every day | ORAL | Status: DC

## 2013-03-04 NOTE — Discharge Summary (Signed)
Physician Discharge Summary  Tiffany Black ZOX:[id number] DOB: [birth year] DOA: 02/28/2013  PCP: Cala Bradford, MD  Admit date: 02/28/2013 Discharge date: 03/04/2013  Time spent:45 minutes  Recommendations for Outpatient Follow-up:  1. PCP in 1 week 2. Physical therapy for vestibular rehab  Discharge Diagnoses:    BPPV (benign paroxysmal positional vertigo)   Vertigo   Sinusitis   HTN (hypertension)   Anxiety   PAF (paroxysmal atrial fibrillation)    Discharge Condition: stable, improved  Diet recommendation: drink plenty of fluid, low sodium  Filed Weights   02/28/13 1702  Weight: 74.8 kg (164 lb 14.5 oz)    History of present illness:  This is a 79 year old female, with known history of HTN, PAF on Xarelto, dyslipidemia, OA, vertigo, anxiety, previous endometrial cancer s/p laparoscopic hysterectomy. According to patient, she has had episodic vertigo for about 5-10 years, and currently utilizes prn Meclizine for this, although only once in a while. Over the [past 2 days, she has experienced severe dizziness/vertigo, and although this is mildly present at rest, it is exacerbated by movement, and she has found it difficult to ambulate without assistance. She denies tinnitus or ear pain, nausea or vomiting, and has a mild non-productive cough, which she attributes to and episode of "acute bronchitis" in April 2014, which she took Azithromycin for. The cough however, "never went away". She denies nausea or vomiting. Since 02/23/13, she had reduced her Prozac from 10 mg daily to 5 mg daily, as she felt it made her "slow", and her last dose was on 02/26/13. He BP has been elevated,since she attempted to reduce her Prozac. She was seen in the ED on 02/27/13, Head CT scan and Brain MRI were negative for acute findings, and she was sent home with a prescription for Meclizine. She took Meclizine when she got home, but has a restless night, with urinary frequency, was unable to sleep much. In AM of  02/28/13, symptoms appeared worse, so she called 911.      Hospital Course:  1. ACute on chronic vertigo exacerbated by orthostatic hypotension -acute on chronic, BPPV with orthostatic hypotension contributing  -Ct head and MRI negative for posterior circulation or brain stem ischemia  -Orthostatics improved from 6/22, treated with IVF for 2 days  -hold lasix for 1-2 weeks -Pt/Ot eval noted, CIR consult requested but declined by CIR -continue meclizine TID  -seen by PT needs ongoing PT, vestibular rehab  2. Sinusitis: noted on Ct with PND  -cotninue Po levaquin for 5 days   3. HTN:  -BP stable, improved  -continue plendil and ARB for now   4. Pafib:  -rate controlled, continue labetalol and xarelto   5. Depression:  -continue prozac    Consultations:  Dr.SChwartz  Discharge Exam: Filed Vitals:   03/03/13 1400 03/03/13 2119 03/03/13 2209 03/04/13 0652  BP: 149/67  162/66 154/69  Pulse: 64 63 63 68  Temp: 98.1 F (36.7 C)  98 F (36.7 C) 98.1 F (36.7 C)  TempSrc: Oral  Oral Oral  Resp: 16  20 20   Height:      Weight:      SpO2: 98%  98% 98%    General: AAOx3 Cardiovascular: S1S2/RRR Respiratory: CTAB  Discharge Instructions  Discharge Orders   Future Appointments Provider Department Dept Phone   03/07/2013 11:45 AM Duke Salvia, MD [facility] Main [facility] [place]) [phone]   03/11/2013 11:00 AM Rachael Fee, MD [facility] [phone]   Future Orders Complete By  Expires     Diet - low sodium heart healthy  As directed     Increase activity slowly  As directed         Medication List    STOP taking these medications       furosemide 20 MG tablet  Commonly known as:  LASIX      TAKE these medications       cholecalciferol 1000 UNITS tablet  Commonly known as:  VITAMIN D  Take 1,000 Units by mouth daily.     felodipine 5 MG 24 hr tablet  Commonly known as:  PLENDIL  Take 5 mg by mouth daily.      FLUoxetine 10 MG tablet  Commonly known as:  PROZAC  Take 5 mg by mouth daily.     labetalol 200 MG tablet  Commonly known as:  NORMODYNE  Take 200 mg by mouth 2 (two) times daily.     levofloxacin 500 MG tablet  Commonly known as:  LEVAQUIN  Take 1 tablet (500 mg total) by mouth daily. For 1 day     meclizine 25 MG tablet  Commonly known as:  ANTIVERT  Take 25 mg by mouth 3 (three) times daily as needed. Dizziness     potassium chloride 10 MEQ CR tablet  Commonly known as:  KLOR-CON  Take 10 mEq by mouth daily.     Rivaroxaban 20 MG Tabs  Commonly known as:  XARELTO  Take 1 tablet (20 mg total) by mouth daily.     SYSTANE 0.4-0.3 % Soln  Generic drug:  Polyethyl Glycol-Propyl Glycol  Place 1 drop into both eyes daily as needed (for dry eyes).     valsartan 80 MG tablet  Commonly known as:  DIOVAN  Take 80 mg by mouth 2 (two) times daily.     zolpidem 5 MG tablet  Commonly known as:  AMBIEN  Take 5 mg by mouth at bedtime as needed. For sleep       Allergies  Allergen Reactions  . Carbamazepine Other (See Comments)    unknown  . Codeine Nausea And Vomiting  . Hydrochlorothiazide Other (See Comments)    unknown  . Penicillins Hives  . Sulfonamide Derivatives Hives      The results of significant diagnostics from this hospitalization (including imaging, microbiology, ancillary and laboratory) are listed below for reference.    Significant Diagnostic Studies: Dg Chest 2 View  02/28/2013   *RADIOLOGY REPORT*  Clinical Data: Dizziness  CHEST - 2 VIEW  Comparison: 02/04/2010  Findings: Cardiomediastinal silhouette is stable. No acute infiltrate or pulmonary edema.  Degenerative changes thoracic spine.  No pleural effusion.  IMPRESSION: No active disease.  No significant change.   Original Report Authenticated By: Natasha Mead, M.D.   Ct Head Wo Contrast  02/28/2013   *RADIOLOGY REPORT*  Clinical Data: Dizziness  CT HEAD WITHOUT CONTRAST  Technique:  Contiguous axial  images were obtained from the base of the skull through the vertex without contrast.  Comparison: 02/27/2013  Findings: Again noted mucosal thickening left maxillary sinus.  No skull fracture.  No intracranial hemorrhage, mass effect or midline shift.  The mastoid air cells are unremarkable.  Stable mild cerebral atrophy.  Stable periventricular chronic white matter disease.  No acute infarction.  No mass lesion is noted on this unenhanced scan.  IMPRESSION: No acute intracranial abnormality.  Stable atrophy and chronic white matter disease.  No significant change.  The   Original Report Authenticated By: Natasha Mead,  M.D.   Ct Head Wo Contrast  02/27/2013   *RADIOLOGY REPORT*  Clinical Data: Intermittent dizziness left-side of head.  History of vertigo, worse than usual.  Hypertension and hyperlipidemia.  CT HEAD WITHOUT CONTRAST  Technique:  Contiguous axial images were obtained from the base of the skull through the vertex without contrast.  Comparison: 01/06/2011.  Findings: No intracranial hemorrhage.  Small vessel disease type changes without CT evidence of large acute infarct.  No intracranial mass lesion detected on this unenhanced exam.  No hydrocephalus.  Vascular calcifications.  Mastoid air cells, middle ear cavities and majority of visualized paranasal sinuses are clear with exception of mucosal thickening superior aspect of the left maxillary sinus.  IMPRESSION:  No intracranial hemorrhage.  Small vessel disease type changes without CT evidence of large acute infarct.  Mucosal thickening superior aspect left maxillary sinus.   Original Report Authenticated By: Lacy Duverney, M.D.   [name] Contrast  02/27/2013   *RADIOLOGY REPORT*  Clinical Data: Vertigo. Hypertension.  Hyperlipidemia.  MRI HEAD WITHOUT CONTRAST  Technique:  Multiplanar, multiecho pulse sequences of the brain and surrounding structures were obtained according to standard protocol without intravenous contrast.  Comparison:  02/27/2013 CT.  Findings: No acute infarct.  No intracranial hemorrhage.  Prominent small vessel disease type changes asymmetric greater on the left.  Other causes of white matter type changes felt to be less likely consideration.  Atrophy without hydrocephalus.  No intracranial mass lesion detected on this unenhanced exam.  Opacification left maxillary sinus which demonstrates restricted motion which may indicate bacterial infection versus presence proteinaceous material. Minimal to mild mucosal thickening ethmoid sinus air cells.  Major intracranial vascular structures are patent.  Cervical medullary junction, pituitary region, pineal region and orbital structures unremarkable.  IMPRESSION: No acute infarct.  Prominent small vessel disease type changes asymmetric greater on the left.  Opacification left maxillary sinus which demonstrates restricted motion which may indicate bacterial infection versus presence proteinaceous material. Minimal to mild mucosal thickening ethmoid sinus air cells.   Original Report Authenticated By: Lacy Duverney, M.D.    Microbiology: Recent Results (from the past 240 hour(s))  URINE CULTURE     Status: None   Collection Time    02/28/13 11:36 AM      Result Value Range Status   Specimen Description URINE, CLEAN CATCH   Final   Special Requests NONE   Final   Culture  Setup Time 02/28/2013 14:07   Final   Colony Count 25,000 COLONIES/ML   Final   Culture     Final   Value: Multiple bacterial morphotypes present, none predominant. Suggest appropriate recollection if clinically indicated.   Report Status 03/01/2013 FINAL   Final     Labs: Basic Metabolic Panel:  Recent Labs Lab 02/27/13 1248 02/28/13 1206 03/01/13 0530  NA 138 137 139  K 3.7 3.4* 3.7  CL 101 101 104  CO2 27 24 24   GLUCOSE 105* 118* 91  BUN 13 9 10   CREATININE 0.73 0.61 0.70  CALCIUM 9.4 9.4 9.5   Liver Function Tests: No results found for this basename: AST, ALT, ALKPHOS, BILITOT, PROT,  ALBUMIN,  in the last 168 hours No results found for this basename: LIPASE, AMYLASE,  in the last 168 hours No results found for this basename: AMMONIA,  in the last 168 hours CBC:  Recent Labs Lab 02/27/13 1248 02/28/13 1206 03/01/13 0530  WBC 7.4 9.0 6.7  NEUTROABS 5.2 7.7  --   HGB 13.2 13.8  13.5  HCT 38.9 39.8 40.6  MCV 90.7 89.6 89.8  PLT 211 208 206   Cardiac Enzymes:  Recent Labs Lab 02/28/13 1206  TROPONINI <0.30   BNP: BNP (last 3 results) No results found for this basename: PROBNP,  in the last 8760 hours CBG: No results found for this basename: GLUCAP,  in the last 168 hours     Signed:  Hermann Dottavio  [facility] 03/04/2013, 10:35 AM

## 2013-03-04 NOTE — Progress Notes (Addendum)
CSW provided patient with bed offers. Patient agreeable to [facility] home. However, she would like to speak with her son first.  Toma Copier C. Semaj Kham MSW, [place] [phone] Patient's son at bedside. CSW informed family of patients out of pocket deductible and copay at [facility] as it is out of network. They are agreeable. Patient cleared for discharge. Packet copied and placed in [facility] ptar called for transportation.  Neely Kammerer C. Kimbley Sprague MSW, LCSW [phone]

## 2013-03-04 NOTE — Progress Notes (Signed)
Physical Therapy Treatment Patient Details Name: Tiffany Black MRN: [id number] DOB: [birth year] Today's Date: 03/04/2013 Time: 1191-4782 PT Time Calculation (min): 24 min  PT Assessment / Plan / Recommendation Comments on Treatment Session  Decreased amb distance this session 2nd increased c/o dizzyeness.  Pt stated sometimes she gets dizzy if she lays still enough. Also, increased c/o dizzyness in hallway which was crowded and pt unable to fully focus.  Pt plans to D/c to SNF.    Follow Up Recommendations  SNF (pt plans to D/C to SNF, will consult LPT)     Does the patient have the potential to tolerate intense rehabilitation     Barriers to Discharge        Equipment Recommendations  Rolling walker with 5" wheels    Recommendations for Other Services    Frequency Min 4X/week   Plan Discharge plan remains appropriate    Precautions / Restrictions Precautions Precautions: Fall Precaution Comments: hx Vertigo Restrictions Weight Bearing Restrictions: No   Pertinent Vitals/Pain Supine BP 142/79 Standing BP 137/71    Mobility  Bed Mobility Bed Mobility: Supine to Sit Supine to Sit: 5: Supervision Details for Bed Mobility Assistance: Supervision for safety and increased time Transfers Transfers: Sit to Stand;Stand to Sit Sit to Stand: 4: Min guard;5: Supervision;From bed Stand to Sit: 4: Min guard;5: Supervision Details for Transfer Assistance: <25% VC's on safety with turns and proper hand placemnent Ambulation/Gait Ambulation/Gait Assistance: 4: Min assist Ambulation Distance (Feet): 38 Feet Assistive device: Rolling walker Ambulation/Gait Assistance Details: decreased amb distance 2nd increased c/o dizzyness.  Pt states she needs to focus staright ahead and avoid head turns. Gait Pattern: Step-through pattern;Decreased stride length;Wide base of support;Trunk flexed Gait velocity: decreased     PT Goals                                                                 progressing    Visit Information  Last PT Received On: 03/04/13 Assistance Needed: +1    Subjective Data      Cognition    good   Balance   fair  End of Session PT - End of Session Equipment Utilized During Treatment: Gait belt Activity Tolerance: Treatment limited secondary to medical complications (Comment) Patient left: in chair;with call bell/phone within reach;with nursing in room Nurse Communication: Mobility status   Felecia Shelling  PTA [facility]  Acute  Rehab Pager      [phone]

## 2013-03-05 ENCOUNTER — Telehealth: Payer: Self-pay | Admitting: Internal Medicine

## 2013-03-05 NOTE — Telephone Encounter (Signed)
Spoke with pt son, pt is currently in a rehab facility and will not be able to make her appt this week. appt cx.

## 2013-03-05 NOTE — Telephone Encounter (Signed)
Follow up  Son said he is returning your call

## 2013-03-07 ENCOUNTER — Ambulatory Visit: Payer: Medicare Other | Admitting: Internal Medicine

## 2013-03-11 ENCOUNTER — Ambulatory Visit: Payer: Medicare Other | Admitting: Gastroenterology

## 2013-03-28 ENCOUNTER — Ambulatory Visit (INDEPENDENT_AMBULATORY_CARE_PROVIDER_SITE_OTHER): Payer: Medicare Other | Admitting: Internal Medicine

## 2013-03-28 ENCOUNTER — Encounter: Payer: Self-pay | Admitting: Internal Medicine

## 2013-03-28 VITALS — BP 144/78 | HR 72 | Ht 65.0 in | Wt 173.0 lb

## 2013-03-28 DIAGNOSIS — R42 Dizziness and giddiness: Secondary | ICD-10-CM

## 2013-03-28 DIAGNOSIS — I10 Essential (primary) hypertension: Secondary | ICD-10-CM

## 2013-03-28 DIAGNOSIS — I4891 Unspecified atrial fibrillation: Secondary | ICD-10-CM

## 2013-03-28 DIAGNOSIS — I491 Atrial premature depolarization: Secondary | ICD-10-CM

## 2013-03-28 NOTE — Assessment & Plan Note (Signed)
This is better. I've encouraged her to continue with therapy

## 2013-03-28 NOTE — Progress Notes (Signed)
Patient Care Team: Cala Bradford, MD as PCP - General (Family Medicine)   HPI  Tiffany Black is a 79 y.o. female seen at the request of Dr. [place] for atrial fibrillation. She has seen Dr. Ladona Ridgel in the past; that connection was not great and she asked to see [place] instead.  She also has a history of hypertension  She has a history of palpitations. She underwent an event recorder October 2013 by Dr. Garth Bigness. He demonstrated symptoms with sinus rhythm and symptoms with PACs and symptoms with a 3 beat run of atrial tachycardia.her palpitations are largely quiescent  She had atrial fibrillation apparently documented at [facility] with Dr. Sharol Harness.     She was hospitalized  For vertigo for 4 weeks including 3 weeks of rehabilitation. She is better but she still has having problems with vertigo which is aggravated by abrupt turning of her head. Apparently neurological evaluation was negative for a stroke. Thromboembolic risk factors are notable for age, hypertension, and gender for a CHADS-VASc score of    Past Medical History  Diagnosis Date  . Hypertension   . Arthritis   . 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 Endometrial ca   . Anxiety   . HLD (hyperlipidemia)   . Cardiomegaly   . Pectus excavatum   . OA (osteoarthritis)   . Vertigo     Past Surgical History  Procedure Laterality Date  . Cesarean section    . Laparoscopic hysterectomy  2011  . Abdominal hysterectomy    . Cataract extraction, bilateral      Current Outpatient Prescriptions  Medication Sig Dispense Refill  . cholecalciferol (VITAMIN D) 1000 UNITS tablet Take 1,000 Units by mouth daily.      . felodipine (PLENDIL) 5 MG 24 hr tablet Take 5 mg by mouth daily.       Marland Kitchen FLUoxetine (PROZAC) 10 MG tablet Take 5 mg by mouth daily.       Marland Kitchen labetalol (NORMODYNE) 200 MG tablet Take 200 mg by mouth 2  (two) times daily.      . meclizine (ANTIVERT) 25 MG tablet Take 25 mg by mouth 3 (three) times daily as needed. Dizziness      . Polyethyl Glycol-Propyl Glycol (SYSTANE) 0.4-0.3 % SOLN Place 1 drop into both eyes daily as needed (for dry eyes).      . potassium chloride (KLOR-CON) 10 MEQ CR tablet Take 10 mEq by mouth daily.       . Rivaroxaban (XARELTO) 20 MG TABS Take 1 tablet (20 mg total) by mouth daily.  30 tablet  6  . valsartan (DIOVAN) 80 MG tablet Take 80 mg by mouth 2 (two) times daily.       Marland Kitchen zolpidem (AMBIEN) 5 MG tablet Take 5 mg by mouth at bedtime as needed. For sleep       No current facility-administered medications for this visit.    Allergies  Allergen Reactions  . Carbamazepine Other (See Comments)    unknown  . Codeine Nausea And Vomiting  . Hydrochlorothiazide Other (See Comments)    unknown  . Penicillins Hives  . Sulfonamide Derivatives Hives    Review of Systems negative except from HPI and PMH  Physical Exam BP 144/78  Pulse 72  Ht 5\' 5"  (1.651 m)  Wt 173 lb (78.472 kg)  BMI 28.79 kg/m2 Well developed and nourished  in no acute distress HENT normal Neck supple with JVP-flat Clear Regular rate and rhythm, no murmurs or gallops Abd-soft with active BS No Clubbing cyanosis edema Skin-warm and dry A & Oriented  Grossly normal sensory and motor function     Assessment and  Plan

## 2013-03-28 NOTE — Assessment & Plan Note (Signed)
Blood pressure is reasonably controlled. 

## 2013-03-28 NOTE — Patient Instructions (Addendum)
Your physician wants you to follow-up in: 6 MONTHS WITH DR KLEIN You will receive a reminder letter in the mail two months in advance. If you don't receive a letter, please call our office to schedule the follow-up appointment.  

## 2013-03-28 NOTE — Assessment & Plan Note (Signed)
Symptomatic but not too frequent

## 2013-03-28 NOTE — Assessment & Plan Note (Signed)
We are without documentation of atrial fibrillation; it apparently comes from [facility]. She is on anticoagulation.

## 2013-04-01 ENCOUNTER — Telehealth: Payer: Self-pay | Admitting: Internal Medicine

## 2013-04-01 NOTE — Telephone Encounter (Signed)
Spoke with pt, she is wanting to know about switching from xarelto to coumadin. She is still having dizziness and she tested negative to the vertigo testing they did. She feels the vertigo maybe related to xarelto. Will discuss with dr Graciela Husbands.

## 2013-04-01 NOTE — Telephone Encounter (Signed)
New Prob  Pt states that she has had problems with with dizziness and after reading the information about it, she wants to know if the Carlena Hurl is causing it.

## 2013-04-01 NOTE — Telephone Encounter (Signed)
Discussed with dr Graciela Husbands, okay given for pt to change to coumadin. Spoke with pt, she has now decided she will wait and stay on the xarelto.

## 2013-04-07 ENCOUNTER — Ambulatory Visit: Payer: Medicare Other | Admitting: Occupational Therapy

## 2013-04-08 ENCOUNTER — Ambulatory Visit: Payer: Medicare Other | Admitting: Physical Therapy

## 2013-04-08 ENCOUNTER — Ambulatory Visit: Payer: Medicare Other | Attending: Geriatric Medicine | Admitting: Physical Therapy

## 2013-04-08 DIAGNOSIS — R262 Difficulty in walking, not elsewhere classified: Secondary | ICD-10-CM | POA: Insufficient documentation

## 2013-04-08 DIAGNOSIS — R42 Dizziness and giddiness: Secondary | ICD-10-CM | POA: Insufficient documentation

## 2013-04-08 DIAGNOSIS — M6281 Muscle weakness (generalized): Secondary | ICD-10-CM | POA: Insufficient documentation

## 2013-04-08 DIAGNOSIS — IMO0001 Reserved for inherently not codable concepts without codable children: Secondary | ICD-10-CM | POA: Insufficient documentation

## 2013-04-10 ENCOUNTER — Ambulatory Visit: Payer: Medicare Other | Admitting: Occupational Therapy

## 2013-04-15 ENCOUNTER — Ambulatory Visit: Payer: Medicare Other | Admitting: Physical Therapy

## 2013-04-22 ENCOUNTER — Ambulatory Visit: Payer: Medicare Other | Admitting: Physical Therapy

## 2013-04-29 ENCOUNTER — Emergency Department (INDEPENDENT_AMBULATORY_CARE_PROVIDER_SITE_OTHER)
Admission: EM | Admit: 2013-04-29 | Discharge: 2013-04-29 | Disposition: A | Discharge status: Home / Self Care | Payer: Medicare Other | Source: Home / Self Care

## 2013-04-29 ENCOUNTER — Encounter (HOSPITAL_COMMUNITY): Payer: Self-pay | Admitting: *Deleted

## 2013-04-29 ENCOUNTER — Encounter: Payer: Medicare Other | Admitting: Physical Therapy

## 2013-04-29 ENCOUNTER — Telehealth: Payer: Self-pay | Admitting: Physician Assistant

## 2013-04-29 DIAGNOSIS — S61209A Unspecified open wound of unspecified finger without damage to nail, initial encounter: Secondary | ICD-10-CM

## 2013-04-29 DIAGNOSIS — S61239A Puncture wound without foreign body of unspecified finger without damage to nail, initial encounter: Secondary | ICD-10-CM

## 2013-04-29 MED ORDER — TETANUS-DIPHTHERIA TOXOIDS TD 5-2 LFU IM INJ
0.5000 mL | INJECTION | Freq: Once | INTRAMUSCULAR | Status: AC
  Administered 2013-04-29: 0.5 mL via INTRAMUSCULAR

## 2013-04-29 MED ORDER — TETANUS-DIPHTHERIA TOXOIDS TD 5-2 LFU IM INJ
INJECTION | INTRAMUSCULAR | Status: AC
  Filled 2013-04-29: qty 0.5

## 2013-04-29 MED ORDER — TETANUS-DIPHTH-ACELL PERTUSSIS 5-2.5-18.5 LF-MCG/0.5 IM SUSP
INTRAMUSCULAR | Status: AC
  Filled 2013-04-29: qty 0.5

## 2013-04-29 MED ORDER — SILVER NITRATE-POT NITRATE 75-25 % EX MISC
1.0000 | Freq: Once | CUTANEOUS | Status: AC
  Administered 2013-04-29: 1 via TOPICAL

## 2013-04-29 MED ORDER — TETANUS-DIPHTH-ACELL PERTUSSIS 5-2.5-18.5 LF-MCG/0.5 IM SUSP: 0.5000 mL | Freq: Once | INTRAMUSCULAR | Status: DC

## 2013-04-29 NOTE — ED Provider Notes (Signed)
CSN: [id number]     Arrival date & time 04/29/13  1959 History     First MD Initiated Contact with Patient 04/29/13 2054     Chief Complaint  Patient presents with  . Extremity Laceration   (Consider location/radiation/quality/duration/timing/severity/associated sxs/prior Treatment) HPI Comments: 79 year old well-preserved female was cleaning a meat cleaver and accidentally produced a 2 mm superficial puncture wound to the tip of the left fifth digit. She is Xarelto and the wound continues to bleed.   Past Medical History  Diagnosis Date  . Hypertension   . Arthritis   . 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 Endometrial ca   . Anxiety   . HLD (hyperlipidemia)   . Cardiomegaly   . Pectus excavatum   . OA (osteoarthritis)   . Vertigo    Past Surgical History  Procedure Laterality Date  . Cesarean section  1975  . Laparoscopic hysterectomy  2011  . Cataract extraction, bilateral Bilateral 2009  . Abdominal hysterectomy  2011   Family History  Problem Relation Age of Onset  . Heart attack Father   . Heart disease Father    History  Substance Use Topics  . Smoking status: Never Smoker   . Smokeless tobacco: Never Used  . Alcohol Use: No   OB History   Grav Para Term Preterm Abortions TAB SAB Ect Mult Living                 Review of Systems  All other systems reviewed and are negative.    Allergies  Carbamazepine; Codeine; Hydrochlorothiazide; Penicillins; and Sulfonamide derivatives  Home Medications   Current Outpatient Rx  Name  Route  Sig  Dispense  Refill  . cholecalciferol (VITAMIN D) 1000 UNITS tablet   Oral   Take 1,000 Units by mouth daily.         . felodipine (PLENDIL) 5 MG 24 hr tablet   Oral   Take 5 mg by mouth daily.          Marland Kitchen FLUoxetine (PROZAC) 10 MG tablet   Oral   Take 5 mg by mouth daily.          Marland Kitchen  labetalol (NORMODYNE) 200 MG tablet   Oral   Take 200 mg by mouth 2 (two) times daily.         . meclizine (ANTIVERT) 25 MG tablet   Oral   Take 25 mg by mouth 3 (three) times daily as needed. Dizziness         . potassium chloride (KLOR-CON) 10 MEQ CR tablet   Oral   Take 10 mEq by mouth daily.          . Rivaroxaban (XARELTO) 20 MG TABS   Oral   Take 1 tablet (20 mg total) by mouth daily.   30 tablet   6   . valsartan (DIOVAN) 80 MG tablet   Oral   Take 80 mg by mouth 2 (two) times daily.          Marland Kitchen zolpidem (AMBIEN) 5 MG tablet   Oral   Take 5 mg by mouth at bedtime as needed. For sleep         . Polyethyl Glycol-Propyl Glycol (SYSTANE) 0.4-0.3 % SOLN   Both Eyes   Place 1 drop into both eyes daily as needed (for dry eyes).  BP 182/74  Pulse 65  Temp(Src) 97.9 F (36.6 C) (Oral)  Resp 16  SpO2 98% Physical Exam  Nursing note and vitals reviewed. Constitutional: She is oriented to person, place, and time. She appears well-developed and well-nourished. No distress.  Pulmonary/Chest: Effort normal. No respiratory distress.  Musculoskeletal: She exhibits no edema and no tenderness.  Neurological: She is alert and oriented to person, place, and time.  Skin: Skin is warm and dry.  2 mm superficial puncture wound to the tip of the left fifth digit. There is a small but steady ooze of venous bleeding. Denies other injury. No blunt trauma for nail damage.  Psychiatric: She has a normal mood and affect.    ED Course   Procedures (including critical care time)  Labs Reviewed - No data to display No results found. 1. Puncture wound of finger of right hand, initial encounter     MDM  Diluted Betadine soak for 10 minutes. It was covered with Surgicel, silver nitrate and a bulky dressing for hemostasis. Also administered a dT. IM.  Hayden Rasmussen, NP 04/29/13 2136

## 2013-04-29 NOTE — ED Notes (Addendum)
Cleaning the meat cleaver in the sink and set her hand down on it. Cut the tip of her R small finger.  Gauze removed and it is oozing slightly.  Pressure reapplied by pt. No numbness.

## 2013-04-29 NOTE — ED Provider Notes (Signed)
Medical screening examination/treatment/procedure(s) were performed by non-physician practitioner and as supervising physician I was immediately available for consultation/collaboration.  Leslee Home, M.D.  Reuben Likes, MD 04/29/13 [phone]

## 2013-04-29 NOTE — ED Notes (Signed)
Pt. refused DPT. States she had pertussis as a child.  I instructed her to tell her doctor she had the opportunity to get the DPT but declined it., so she can put it on her record.

## 2013-04-29 NOTE — Telephone Encounter (Signed)
Pt called answering service. Cut finger on meat cleaver. Is on Xarelto. Bleeding for the past 20 min and can't get it to stop even with pressure and holding it above her head. I advised she proceed to urgent care to have this evaluated as it may require bonding or suturing. She is also not UTD on tetatnus. She verbalized understanding and gratitude and plans to proceed. Dayna Dunn PA-C

## 2013-05-06 ENCOUNTER — Encounter: Payer: Medicare Other | Admitting: Physical Therapy

## 2013-06-05 ENCOUNTER — Telehealth: Payer: Self-pay | Admitting: Internal Medicine

## 2013-06-05 NOTE — Telephone Encounter (Signed)
Patient states that one month ago she cut her finger and had to go to urgent care because it wouldn't stop bleeding for 2 hours  - they stopped the bleeding. She wanted blood work - I explained there is no specific test to test for xarelto and explained that her labs a few months ago were normal blood count. There are no other symptoms of bleeding according to patient (no blood in urine, stool, no petechia, etc.) She seems to be extremely stressed over her husband start of chemo soon, she spoke a lot about that. We discussed the bleeding risks with xarelto. Patient expressed need for samples - I left samples at desk for patient. Patient will pick them up this afternoon.

## 2013-06-05 NOTE — Telephone Encounter (Signed)
New Problem  Pt states she has not been feeling well.. Currently taking Xarelto as well as alot of BP meds and she thinks her blood is very thin..request an appt today..  Please advise

## 2013-07-08 ENCOUNTER — Telehealth: Payer: Self-pay

## 2013-07-08 NOTE — Telephone Encounter (Signed)
SAMPLES

## 2013-08-14 ENCOUNTER — Telehealth: Payer: Self-pay | Admitting: *Deleted

## 2013-08-14 NOTE — Telephone Encounter (Signed)
Patient requests xarelto samples, she is aware they will be left at front desk for pick up.

## 2013-08-19 ENCOUNTER — Telehealth: Payer: Self-pay | Admitting: *Deleted

## 2013-08-19 NOTE — Telephone Encounter (Signed)
Patient requests xarelto samples to get her through until the first of the year. Samples left at the front desk for pick up.

## 2013-09-15 ENCOUNTER — Telehealth: Payer: Self-pay | Admitting: *Deleted

## 2013-09-15 NOTE — Telephone Encounter (Signed)
[facility] states she has a PA already complete for this year, 2015,  for the xarelto.

## 2013-09-30 ENCOUNTER — Ambulatory Visit (INDEPENDENT_AMBULATORY_CARE_PROVIDER_SITE_OTHER): Payer: Medicare Other | Admitting: Internal Medicine

## 2013-09-30 ENCOUNTER — Encounter: Payer: Self-pay | Admitting: Internal Medicine

## 2013-09-30 VITALS — BP 176/88 | HR 63 | Ht 65.0 in | Wt 172.0 lb

## 2013-09-30 DIAGNOSIS — I4891 Unspecified atrial fibrillation: Secondary | ICD-10-CM

## 2013-09-30 DIAGNOSIS — I491 Atrial premature depolarization: Secondary | ICD-10-CM

## 2013-09-30 DIAGNOSIS — E876 Hypokalemia: Secondary | ICD-10-CM | POA: Insufficient documentation

## 2013-09-30 DIAGNOSIS — I10 Essential (primary) hypertension: Secondary | ICD-10-CM

## 2013-09-30 NOTE — Assessment & Plan Note (Signed)
We will check potassium today and again in 6 months s time.

## 2013-09-30 NOTE — Assessment & Plan Note (Signed)
No symptomatic atrial fibrillation. She is tolerating Rivaroxaban  She has had small subcutaneousbleeds

## 2013-09-30 NOTE — Progress Notes (Signed)
Patient Care Team: [name], MD as PCP - General (Family Medicine)   HPI  Tiffany Black is a 80 y.o. female seen at the request of Dr. Martinique for atrial fibrillation . She also has a history of hypertension  She underwent an event recorder October 2013 by Dr. Shirlee More. He demonstrated symptoms with sinus rhythm and symptoms with PACs and symptoms with a 3 beat run of atrial tachycardia. her palpitations are largely quiescent  She had atrial fibrillation apparently documented at [facility] with Dr. Rosita Fire.  We dont have documentation   normal blood work in June 2104  she is currently undergoing a great deal of stress as her husband has end-stage lung cancer and she is being supported by hospice   Past Medical History  Diagnosis Date  . Hypertension   . Arthritis   . 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 Endometrial ca   . Anxiety   . HLD (hyperlipidemia)   . Cardiomegaly   . Pectus excavatum   . OA (osteoarthritis)   . Vertigo     Past Surgical History  Procedure Laterality Date  . Cesarean section  1975  . Laparoscopic hysterectomy  2011  . Cataract extraction, bilateral Bilateral 2009  . Abdominal hysterectomy  2011    Current Outpatient Prescriptions  Medication Sig Dispense Refill  . cholecalciferol (VITAMIN D) 1000 UNITS tablet Take 1,000 Units by mouth daily.      . felodipine (PLENDIL) 5 MG 24 hr tablet Take 5 mg by mouth daily. 1/2 tablet in the AM and 1/2 tablet in the PM.      . FLUoxetine (PROZAC) 10 MG tablet Take 10 mg by mouth daily.       Marland Kitchen KLOR-CON M20 20 MEQ tablet 20 mEq daily. 1/2 tab daily. 10 MeQ      . labetalol (NORMODYNE) 200 MG tablet Take 200 mg by mouth 2 (two) times daily.      . meclizine (ANTIVERT) 25 MG tablet Take 25 mg by mouth 2 (two) times daily. Dizziness      . Polyethyl Glycol-Propyl Glycol  (SYSTANE) 0.4-0.3 % SOLN Place 1 drop into both eyes daily as needed (for dry eyes).      . Rivaroxaban (XARELTO) 20 MG TABS Take 1 tablet (20 mg total) by mouth daily.  30 tablet  6  . valsartan (DIOVAN) 80 MG tablet Take 80 mg by mouth 2 (two) times daily.       Marland Kitchen zolpidem (AMBIEN) 5 MG tablet Take 5 mg by mouth at bedtime as needed. For sleep       No current facility-administered medications for this visit.    Allergies  Allergen Reactions  . Carbamazepine Other (See Comments)    unknown  . Codeine Nausea And Vomiting  . Hydrochlorothiazide Other (See Comments)    unknown  . Penicillins Hives  . Sulfonamide Derivatives Hives    Review of Systems negative except from HPI and PMH  Physical Exam BP 176/88  Pulse 63  Ht 5\' 5"  (1.651 m)  Wt 172 lb (78.019 kg)  BMI 28.62 kg/m2 Well developed and nourished in no acute distress HENT normal Neck supple with JVP-flat Clear Regular rate and rhythm, no murmurs or gallops Abd-soft with active BS No Clubbing cyanosis edema Skin-warm and dry A & Oriented  Grossly normal sensory and motor  function    Assessment and  Plan

## 2013-09-30 NOTE — Patient Instructions (Signed)
Your physician recommends that you continue on your current medications as directed. Please refer to the Current Medication list given to you today.  Your physician recommends that you return for lab work today: BMET  Your physician wants you to follow-up in: 6 months with Dr. Caryl Comes. You will receive a reminder letter in the mail two months in advance. If you don't receive a letter, please call our office to schedule the follow-up appointment.

## 2013-09-30 NOTE — Assessment & Plan Note (Signed)
Elevated but she is currently undergoing a great deal of stress as her husband has end-stage lung cancer and she is being supported by hospice

## 2013-10-01 LAB — BASIC METABOLIC PANEL
BUN: 12 mg/dL (ref 6–23)
CALCIUM: 9.1 mg/dL (ref 8.4–10.5)
CO2: 28 meq/L (ref 19–32)
CREATININE: 0.8 mg/dL (ref 0.4–1.2)
Chloride: 105 mEq/L (ref 96–112)
GFR: 74.83 mL/min (ref >60.00)
GLUCOSE: 96 mg/dL (ref 70–99)
Potassium: 4 mEq/L (ref 3.5–5.1)
Sodium: 139 mEq/L (ref 135–145)

## 2013-10-11 ENCOUNTER — Other Ambulatory Visit: Payer: Self-pay | Admitting: Internal Medicine

## 2013-10-16 ENCOUNTER — Telehealth: Payer: Self-pay | Admitting: Internal Medicine

## 2013-10-16 NOTE — Telephone Encounter (Signed)
A user error has taken place: encounter opened in error, closed for administrative reasons.   See 1/20 lab result for documentation

## 2013-10-16 NOTE — Telephone Encounter (Signed)
New message ° ° ° ° °Returning a nurses call to get lab results °

## 2014-01-14 ENCOUNTER — Telehealth: Payer: Self-pay | Admitting: Internal Medicine

## 2014-01-14 NOTE — Telephone Encounter (Signed)
Pt tells me that she feels ok now, she is currently out picking up her medications.  She denies dizziness/palpitations at the present time and states that she does not need to come by the office at this time. She states that she feels fine and would like to call back if she experiences symptoms again. I advised to call us if becomes symptomatic.

## 2014-01-14 NOTE — Telephone Encounter (Signed)
New Message:  Pt is c/o dizziness and states she is having palpitations. Pt is requesting to speak w/ Sherri.

## 2014-03-02 ENCOUNTER — Telehealth: Payer: Self-pay

## 2014-03-02 ENCOUNTER — Encounter: Payer: Self-pay | Admitting: Internal Medicine

## 2014-03-02 ENCOUNTER — Ambulatory Visit (INDEPENDENT_AMBULATORY_CARE_PROVIDER_SITE_OTHER): Payer: Medicare Other | Admitting: Internal Medicine

## 2014-03-02 VITALS — BP 145/83 | HR 61 | Ht 65.0 in | Wt 172.0 lb

## 2014-03-02 DIAGNOSIS — I4891 Unspecified atrial fibrillation: Secondary | ICD-10-CM

## 2014-03-02 DIAGNOSIS — I480 Paroxysmal atrial fibrillation: Secondary | ICD-10-CM

## 2014-03-02 LAB — BASIC METABOLIC PANEL
BUN: 15 mg/dL (ref 6–23)
CALCIUM: 9.5 mg/dL (ref 8.4–10.5)
CO2: 30 mEq/L (ref 19–32)
CREATININE: 0.7 mg/dL (ref 0.4–1.2)
Chloride: 105 mEq/L (ref 96–112)
GFR: 84.55 mL/min (ref >60.00)
Glucose, Bld: 102 mg/dL — ABNORMAL HIGH (ref 70–99)
Potassium: 4.1 mEq/L (ref 3.5–5.1)
Sodium: 141 mEq/L (ref 135–145)

## 2014-03-02 NOTE — Patient Instructions (Signed)
Your physician recommends that you have lab work today: BMP  Your physician wants you to follow-up in: 6 months. You will receive a reminder letter in the mail two months in advance. If you don't receive a letter, please call our office to schedule the follow-up appointment.  Your physician recommends that you continue on your current medications as directed. Please refer to the Current Medication list given to you today.

## 2014-03-02 NOTE — Progress Notes (Signed)
Patient Care Team: [name], MD as PCP - General (Family Medicine)   HPI  Tiffany Black is a 80 y.o. female seen at the request of Dr. Martinique for atrial fibrillation . She also has a history of hypertension  She underwent an event recorder October 2013 by Dr. Shirlee More. He demonstrated symptoms with sinus rhythm and symptoms with PACs and symptoms with a 3 beat run of atrial tachycardia. her palpitations are largely quiescent  She had atrial fibrillation apparently documented at [facility] with Dr. Rosita Fire.  We dont have documentation   normal blood work in June 2104   Her husband of 46 years died in 11-21-2022. She and her son are doing pretty well  Past Medical History  Diagnosis Date  . Hypertension   . Arthritis   . 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 Endometrial ca   . Anxiety   . HLD (hyperlipidemia)   . Cardiomegaly   . Pectus excavatum   . OA (osteoarthritis)   . Vertigo     Past Surgical History  Procedure Laterality Date  . Cesarean section  1975  . Laparoscopic hysterectomy  2011  . Cataract extraction, bilateral Bilateral 2009  . Abdominal hysterectomy  2011    Current Outpatient Prescriptions  Medication Sig Dispense Refill  . cholecalciferol (VITAMIN D) 1000 UNITS tablet Take 1,000 Units by mouth daily.      . felodipine (PLENDIL) 5 MG 24 hr tablet Take 5 mg by mouth daily. 1/2 tablet in the AM and 1/2 tablet in the PM.      . FLUoxetine (PROZAC) 10 MG tablet Take 10 mg by mouth daily.       Marland Kitchen KLOR-CON M20 20 MEQ tablet 20 mEq daily. 1/2 tab daily. 10 MeQ      . labetalol (NORMODYNE) 200 MG tablet Take 200 mg by mouth 2 (two) times daily.      . meclizine (ANTIVERT) 25 MG tablet Take 25 mg by mouth 2 (two) times daily. Dizziness      . Polyethyl Glycol-Propyl Glycol (SYSTANE) 0.4-0.3 % SOLN Place 1 drop into both eyes daily  as needed (for dry eyes).      . valsartan (DIOVAN) 80 MG tablet Take 80 mg by mouth 2 (two) times daily.       Alveda Reasons 20 MG TABS tablet TAKE ONE TABLET BY MOUTH ONCE DAILY.  90 tablet  2  . zolpidem (AMBIEN) 5 MG tablet Take 5 mg by mouth at bedtime as needed. For sleep       No current facility-administered medications for this visit.    Allergies  Allergen Reactions  . Carbamazepine Other (See Comments)    unknown  . Codeine Nausea And Vomiting  . Hydrochlorothiazide Other (See Comments)    unknown  . Penicillins Hives  . Sulfonamide Derivatives Hives    Review of Systems negative except from HPI and PMH  Physical Exam BP 145/83  Pulse 61  Ht 5\' 5"  (1.651 m)  Wt 172 lb (78.019 kg)  BMI 28.62 kg/m2 Well developed and nourished in no acute distress HENT normal Neck supple with JVP-flat Clear Regular rate and rhythm, no murmurs or gallops Abd-soft with active BS No Clubbing cyanosis edema Skin-warm and dry A & Oriented  Grossly normal sensory and motor function    Assessment and  Plan  Hypokalemia  Hypertension  Atrial fibrillation  Pressures reasonably controlled. A lot of work is going to this over the years. We will not make any adjustments even though her a.m. pressure of about 120.  We will check her potassium today.  We'll continue her on Rivaroxaban indefinitely given her history of atrial fibrillation and risk factors

## 2014-03-02 NOTE — Telephone Encounter (Signed)
Patient came to office to get samples of xarelto 20 mg gave them to her up front

## 2014-03-06 ENCOUNTER — Telehealth: Payer: Self-pay | Admitting: *Deleted

## 2014-03-06 NOTE — Telephone Encounter (Signed)
pt notified about lab results with verbal understanding  

## 2014-03-25 ENCOUNTER — Telehealth: Payer: Self-pay | Admitting: *Deleted

## 2014-03-25 NOTE — Telephone Encounter (Signed)
Faxed clearance for right knee surgery to [facility]. Pt may stop Xarelto 36 hour prior to surgery.

## 2014-05-21 ENCOUNTER — Telehealth: Payer: Self-pay

## 2014-05-21 NOTE — Telephone Encounter (Signed)
Patient called for sample of xarelto placed them up front

## 2014-06-15 ENCOUNTER — Telehealth: Payer: Self-pay | Admitting: Internal Medicine

## 2014-06-15 NOTE — Telephone Encounter (Signed)
Patient c/o no energy, can't sleep at night, and she is having memory issues.  Advised to contact her PCP to discuss. She verbalized understanding.  Patient also asking for more samples (she was given samples last month). I explained that we could not keep giving samples, explained reasoning, and advised pt to contact pharmaceutical company for medication assistance. She was hesitant on this but agreed to contact them. I also explained that we could give her a few days to help get her through while in process of acquiring med assistance (informed her that we could not give another month of samples).  She is agreeable to contact them for assistance, or call us back if needs help accessing information.

## 2014-06-15 NOTE — Telephone Encounter (Signed)
New message    Patient calling C/O  just do not feel right,. Don't sleep.  Unsure if it's xarelto.

## 2014-07-09 ENCOUNTER — Telehealth: Payer: Self-pay | Admitting: Internal Medicine

## 2014-07-09 ENCOUNTER — Encounter: Payer: Self-pay | Admitting: Physician Assistant

## 2014-07-09 ENCOUNTER — Ambulatory Visit (INDEPENDENT_AMBULATORY_CARE_PROVIDER_SITE_OTHER): Payer: Medicare Other | Admitting: Physician Assistant

## 2014-07-09 VITALS — BP 150/82 | HR 67 | Ht 65.0 in | Wt 174.6 lb

## 2014-07-09 DIAGNOSIS — G8929 Other chronic pain: Secondary | ICD-10-CM

## 2014-07-09 DIAGNOSIS — I10 Essential (primary) hypertension: Secondary | ICD-10-CM

## 2014-07-09 DIAGNOSIS — I480 Paroxysmal atrial fibrillation: Secondary | ICD-10-CM

## 2014-07-09 DIAGNOSIS — R002 Palpitations: Secondary | ICD-10-CM

## 2014-07-09 DIAGNOSIS — R51 Headache: Secondary | ICD-10-CM

## 2014-07-09 DIAGNOSIS — Z7901 Long term (current) use of anticoagulants: Secondary | ICD-10-CM

## 2014-07-09 DIAGNOSIS — R011 Cardiac murmur, unspecified: Secondary | ICD-10-CM

## 2014-07-09 DIAGNOSIS — R42 Dizziness and giddiness: Secondary | ICD-10-CM

## 2014-07-09 LAB — CBC WITH DIFFERENTIAL/PLATELET
Basophils Absolute: 0 10*3/uL (ref 0.0–0.1)
Basophils Relative: 0.6 % (ref 0.0–3.0)
EOS PCT: 3.8 % (ref 0.0–5.0)
Eosinophils Absolute: 0.3 10*3/uL (ref 0.0–0.7)
HCT: 39.5 % (ref 36.0–46.0)
Hemoglobin: 13 g/dL (ref 12.0–15.0)
Lymphocytes Relative: 25.5 % (ref 12.0–46.0)
Lymphs Abs: 1.8 10*3/uL (ref 0.7–4.0)
MCHC: 32.9 g/dL (ref 30.0–36.0)
MCV: 92.2 fl (ref 78.0–100.0)
MONO ABS: 0.5 10*3/uL (ref 0.1–1.0)
Monocytes Relative: 7.4 % (ref 3.0–12.0)
NEUTROS PCT: 62.7 % (ref 43.0–77.0)
Neutro Abs: 4.3 10*3/uL (ref 1.4–7.7)
PLATELETS: 191 10*3/uL (ref 150.0–400.0)
RBC: 4.29 Mil/uL (ref 3.87–5.11)
RDW: 13.7 % (ref 11.5–15.5)
WBC: 6.9 10*3/uL (ref 4.0–10.5)

## 2014-07-09 LAB — BASIC METABOLIC PANEL
BUN: 13 mg/dL (ref 6–23)
CALCIUM: 9.2 mg/dL (ref 8.4–10.5)
CO2: 31 mEq/L (ref 19–32)
Chloride: 105 mEq/L (ref 96–112)
Creatinine, Ser: 0.8 mg/dL (ref 0.4–1.2)
GFR: 74.68 mL/min (ref >60.00)
GLUCOSE: 87 mg/dL (ref 70–99)
Potassium: 4.2 mEq/L (ref 3.5–5.1)
SODIUM: 137 meq/L (ref 135–145)

## 2014-07-09 LAB — MAGNESIUM: MAGNESIUM: 2 mg/dL (ref 1.5–2.5)

## 2014-07-09 MED ORDER — LABETALOL HCL 100 MG PO TABS: 100.0000 mg | ORAL_TABLET | Freq: Two times a day (BID) | ORAL | Status: DC

## 2014-07-09 NOTE — Telephone Encounter (Signed)
Pt called because she is not feeling well. She has been waking up during the night with her heart racing for the last 2 nights. Pt feels her heart beating hard when lying down. Pt states does not  feel well she is weak. Pt would like to be seen today if possible. Richardson Dopp PA aware; he  Marita Snellen for pt to come to be seen in his clinic at 1:30 PM pt is aware.

## 2014-07-09 NOTE — Progress Notes (Signed)
Cardiology Office Note   Date:  07/09/2014   ID:  Tiffany Black, DOB Aug 16, 1934, MRN [id number]  PCP:  [name], MD  Cardiologist/Electrophysiologist:  Dr. Virl Axe     History of Present Illness: Tiffany Black is a 80 y.o. female with a hx of PAF on Xarelto for AC, HTN, HL.  Last seen by Dr. Virl Axe 02/2014.    CHADS2-VASc=4 (female, HTN, age > 31).  Patient called in today with fatigue and rapid palpitations awakening her at night.  She is added on to my schedule for further evaluation.  The patient complains of many different symptoms.  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  She is still not clear what happened.  She has a copy of all her records with her today and we reviewed that admission and her studies today.  She has a hx of chronic HAs (L sided).  She has seen neurology in the past with improvement on Carbamazepine.  However, this was DCd 2/2 increasing BP.  She has a copy of her MRI and CT from 2014 that demonstrated small vessel disease and L ethmoid sinus congestion/mucosal thickening.  Her main reason for coming in today was for "arrhythmias" the last 2 nights.  She is awoken by rapid palpitation.  This last seconds but recurs and awakens her again.  She does not sleep well but denies increased stress. She denies assoc dyspnea, chest pain, dizziness, fatigue.  She denies exertional chest pain or dyspnea.  She is NYHA 2.  She denies orthopnea, PND, edema.     Studies:  - Echo (10/13):  EF 55% to 60%.  Left atrium: The atrium was mildly dilated.  - Carotid US (12/08):  Mild bilateral carotid bifurcation and proximal ICA plaque without hemodynamically significant stenosis. Continued surveillance recommended  - Event Monitor (8/13):  NSR, PACs   Recent Labs/Images:  Recent Labs  03/02/14 1449  NA 141  K 4.1  BUN 15  CREATININE 0.7      Wt Readings from Last 3 Encounters:  07/09/14 174 lb 9.6 oz (79.198 kg)    03/02/14 172 lb (78.019 kg)  09/30/13 172 lb (78.019 kg)     Past Medical History  Diagnosis Date  . Hypertension   . Arthritis   . 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 Endometrial ca   . Anxiety   . HLD (hyperlipidemia)   . Cardiomegaly   . Pectus excavatum   . OA (osteoarthritis)   . Vertigo     Current Outpatient Prescriptions  Medication Sig Dispense Refill  . cholecalciferol (VITAMIN D) 1000 UNITS tablet Take 1,000 Units by mouth daily.      . felodipine (PLENDIL) 5 MG 24 hr tablet Take 5 mg by mouth daily. 1/2 tablet in the AM and 1/2 tablet in the PM.      . FLUoxetine (PROZAC) 10 MG tablet Take 10 mg by mouth daily.       Marland Kitchen KLOR-CON M20 20 MEQ tablet 20 mEq daily. 1/2 tab daily. 10 MeQ      . labetalol (NORMODYNE) 200 MG tablet Take 200 mg by mouth 2 (two) times daily.      . meclizine (ANTIVERT) 25 MG tablet Take 25 mg by mouth 2 (two) times daily. Dizziness      . Polyethyl Glycol-Propyl Glycol (SYSTANE) 0.4-0.3 %  SOLN Place 1 drop into both eyes daily as needed (for dry eyes).      . valsartan (DIOVAN) 80 MG tablet Take 80 mg by mouth 2 (two) times daily.       Alveda Reasons 20 MG TABS tablet TAKE ONE TABLET BY MOUTH ONCE DAILY.  90 tablet  2  . zolpidem (AMBIEN) 5 MG tablet Take 5 mg by mouth at bedtime as needed. For sleep       No current facility-administered medications for this visit.     Allergies:   Carbamazepine; Codeine; Hydrochlorothiazide; Penicillins; and Sulfonamide derivatives   Social History:  The patient  reports that she has never smoked. She has never used smokeless tobacco. She reports that she does not drink alcohol or use illicit drugs.   Family History:  The patient's family history includes Heart attack in her father; Heart disease in her father.   ROS:  Please see the history of present illness.       All other systems  reviewed and negative.    PHYSICAL EXAM: VS:  BP 150/82  Pulse 67  Ht 5\' 5"  (1.651 m)  Wt 174 lb 9.6 oz (79.198 kg)  BMI 29.05 kg/m2 Well nourished, well developed, in no acute distress HEENT: normal Neck: no JVD Endocrine:  No thyromegaly Vascular: no carotid bruits bilaterally Cardiac:  normal S1, S2; RRR; 1-0/1 systolic murmurRUSB Lungs:   clear to auscultation bilaterally, no wheezing, rhonchi or rales Abd: soft, nontender, no hepatomegaly Ext:  no edema Skin: warm and dry Neuro:  CNs 2-12 intact, no focal abnormalities noted  EKG:  NSR, HR 67, LAD, NSSTTW changes, no change since last tracing      ASSESSMENT AND PLAN:  1.  Palpitations:  Etiology not clear.  She may be having paroxysms of PAF or increased frequency of PACs.      -  Check BMET, TSH, Mg2+    -  Arrange Event monitor (she may opt to defer this for now)    -  Increase Labetalol to 300 mg bid.  2.  Paroxysmal atrial fibrillation:  In NSR today.  Arrange event monitor as noted.  Continue Xarelto, beta blocker.      -  FU BMET and CBC today 3.  Murmur:  Probably aortic sclerosis.  Reviewed notes.  Murmur not previously described.      -  Check Echo 4.  Essential hypertension, benign:  Fair control.  Adjust Labetalol as noted.  5.  Chronic nonintractable headache, unspecified headache type:  FU with PCP. 6.  Vertigo: FU with PCP.    Disposition:   FU with Dr. Virl Axe 11/23 as planned.    Signed, Versie Starks, MHS 07/09/2014 1:57 PM    [facility] [address] [place], [place],   [address] Phone: [phone]; Fax: [phone]

## 2014-07-09 NOTE — Telephone Encounter (Signed)
New message    Patient calling C/O woke up at night heart beat irregular.  Just don't feel well. Hurting in her neck.

## 2014-07-09 NOTE — Patient Instructions (Signed)
Your physician has recommended you make the following change in your medication:  1) INCREASE Labetalol to 300 twice daily  Your physician has recommended that you wear an event monitor. Event monitors are medical devices that record the heart's electrical activity. Doctors most often [place] these monitors to diagnose arrhythmias. Arrhythmias are problems with the speed or rhythm of the heartbeat. The monitor is a small, portable device. You can wear one while you do your normal daily activities. This is usually used to diagnose what is causing palpitations/syncope (passing out).  Your physician has requested that you have an echocardiogram. Echocardiography is a painless test that uses sound waves to create images of your heart. It provides your doctor with information about the size and shape of your heart and how well your heart's chambers and valves are working. This procedure takes approximately one hour. There are no restrictions for this procedure.  Your physician recommends that you have lab work TODAY (BMET, TSH, [place])

## 2014-07-10 ENCOUNTER — Telehealth: Payer: Self-pay | Admitting: Physician Assistant

## 2014-07-10 ENCOUNTER — Telehealth: Payer: Self-pay | Admitting: Internal Medicine

## 2014-07-10 LAB — TSH: TSH: 2.23 u[IU]/mL (ref 0.35–4.50)

## 2014-07-10 MED ORDER — LABETALOL HCL 200 MG PO TABS: 200.0000 mg | ORAL_TABLET | Freq: Two times a day (BID) | ORAL | Status: DC

## 2014-07-10 NOTE — Telephone Encounter (Signed)
See note from scheduling. She also cancelled her echo for Monday.

## 2014-07-10 NOTE — Telephone Encounter (Signed)
Patient reports that after taking increased dose of Labetolol last pm, she urinated a "gallon' and feels much better. Does not want to take 2 different strengths of Labetolol, but wants to break the 200 mg in half. Advised her I would contact her pharmacy and order the 200 mg tablets to take 1 1/2 tabs twice daily. She cancelled Echo. BP down to 122/80.

## 2014-07-10 NOTE — Telephone Encounter (Signed)
New problem   Pt stated her bp has changed a lot since her medication was changed on yesterday. Please advise pt.

## 2014-07-10 NOTE — Telephone Encounter (Signed)
New problem   Pt stated he bp has changed a lot since [facility] changed her medication yesterday. Pt want to speak to nurse.

## 2014-07-10 NOTE — Telephone Encounter (Signed)
Patient informed of labs

## 2014-07-10 NOTE — Telephone Encounter (Signed)
Follow up ° ° ° ° °Returning a nurses call °

## 2014-07-10 NOTE — Telephone Encounter (Signed)
Tiffany Black had ordered a event monitor.  Call to schedule and patient has refused the monitor.

## 2014-07-10 NOTE — Telephone Encounter (Signed)
[place], [place]   07/10/2014 2:07 PM

## 2014-07-13 ENCOUNTER — Other Ambulatory Visit (HOSPITAL_COMMUNITY): Payer: Medicare Other

## 2014-07-14 ENCOUNTER — Ambulatory Visit (HOSPITAL_COMMUNITY): Payer: Medicare Other | Attending: Physician Assistant | Admitting: Cardiology

## 2014-07-14 DIAGNOSIS — R002 Palpitations: Secondary | ICD-10-CM

## 2014-07-14 DIAGNOSIS — R011 Cardiac murmur, unspecified: Secondary | ICD-10-CM

## 2014-07-14 DIAGNOSIS — E785 Hyperlipidemia, unspecified: Secondary | ICD-10-CM | POA: Diagnosis not present

## 2014-07-14 DIAGNOSIS — I10 Essential (primary) hypertension: Secondary | ICD-10-CM | POA: Insufficient documentation

## 2014-07-14 NOTE — Progress Notes (Signed)
Echo performed. 

## 2014-07-15 ENCOUNTER — Encounter: Payer: Self-pay | Admitting: Physician Assistant

## 2014-07-15 ENCOUNTER — Telehealth: Payer: Self-pay | Admitting: *Deleted

## 2014-07-15 NOTE — Telephone Encounter (Signed)
pt notified about echo results with verbal understanding 

## 2014-08-03 ENCOUNTER — Ambulatory Visit (INDEPENDENT_AMBULATORY_CARE_PROVIDER_SITE_OTHER): Payer: Medicare Other | Admitting: Internal Medicine

## 2014-08-03 ENCOUNTER — Encounter: Payer: Self-pay | Admitting: Internal Medicine

## 2014-08-03 VITALS — BP 158/88 | HR 65 | Ht 65.0 in | Wt 174.8 lb

## 2014-08-03 DIAGNOSIS — I480 Paroxysmal atrial fibrillation: Secondary | ICD-10-CM

## 2014-08-03 DIAGNOSIS — I442 Atrioventricular block, complete: Secondary | ICD-10-CM

## 2014-08-03 NOTE — Patient Instructions (Addendum)
Your physician has recommended you make the following change in your medication:  1) DECREASE Valsartan to 80 mg every morning.  Your physician recommends that you schedule a follow-up appointment in: 3 months with Dr. Caryl Comes.  Start using an abdominal binder per Dr. Olin Pia instructions.

## 2014-08-03 NOTE — Progress Notes (Signed)
Patient Care Team: [name], MD as PCP - General (Family Medicine)   HPI  Tiffany Black is a 80 y.o. female seen at the request of Dr. Martinique for atrial fibrillation . She also has a history of hypertension  She underwent an event recorder October 2013 by Dr. Shirlee More. He demonstrated symptoms with sinus rhythm and symptoms with PACs and symptoms with a 3 beat run of atrial tachycardia. Her symptoms have been largely quiescent; however, she saw Richardson Dopp 10/29 for palpitations. Event recorder was recommended. Labetalol doses were uptitrated as she was also hypertensive. She found that her blood pressure was too low and she decreased her medicines back to 200 mg twice daily.  She notes that she has been seen by Dr. [facility] for vascular disease in blood pressure. Her PCP has been reluctant to adjust her blood pressure medications apparently  She continues to have questions about her hospitalization 6/14 when she was admitted with a diagnosis of benign positional vertigo. She continues to complain of dizziness. This is largely orthostatic in nature.  Echo 10/15 demonstrated normal LV function and mild LVH and left atrial enlargement   She had atrial fibrillation apparently documented at [facility] with Dr. Rosita Fire.  We dont have documentation   normal blood work in June 2104   Her husband of 8 years died in Dec 04, 2022. She and her son are doing pretty well  Past Medical History  Diagnosis Date  . Hypertension   . Arthritis   . 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 Endometrial ca   . Anxiety   . HLD (hyperlipidemia)   . Cardiomegaly   . Pectus excavatum   . OA (osteoarthritis)   . Vertigo   . Hx of echocardiogram     a. Echo (11/15): Mild LVH, EF 55-60%, normal wall motion, grade 2 diastolic dysfunction, mild LAE, normal RV function, PASP 29  mm Hg    Past Surgical History  Procedure Laterality Date  . Cesarean section  1975  . Laparoscopic hysterectomy  2011  . Cataract extraction, bilateral Bilateral 2009  . Abdominal hysterectomy  2011    Current Outpatient Prescriptions  Medication Sig Dispense Refill  . cholecalciferol (VITAMIN D) 1000 UNITS tablet Take 1,000 Units by mouth daily.    . felodipine (PLENDIL) 5 MG 24 hr tablet Take 5 mg by mouth daily. 1/2 tablet in the AM and 1/2 tablet in the PM.    . FLUoxetine (PROZAC) 10 MG tablet Take 10 mg by mouth daily.     Marland Kitchen KLOR-CON M20 20 MEQ tablet 20 mEq daily. 1/2 tab daily. 10 MeQ    . labetalol (NORMODYNE) 200 MG tablet Take 1 tablet (200 mg total) by mouth 2 (two) times daily. Takes 1 1/2 tablets bid (Patient taking differently: Take 200 mg by mouth 2 (two) times daily. Takes extra 1/2 tab if needed.) 90 tablet 6  . meclizine (ANTIVERT) 25 MG tablet Take 25 mg by mouth 2 (two) times daily. Dizziness    . PROCTOSOL HC 2.5 % rectal cream Place 1 application rectally as needed. hemrrohids    . valsartan (DIOVAN) 80 MG tablet Take 80 mg by mouth 2 (two) times daily.     Alveda Reasons 20 MG TABS tablet TAKE ONE TABLET BY MOUTH ONCE DAILY. 90 tablet 2  . zolpidem (AMBIEN) 5  MG tablet Take 5 mg by mouth at bedtime as needed. For sleep     No current facility-administered medications for this visit.    Allergies  Allergen Reactions  . Carbamazepine Other (See Comments)    unknown  . Codeine Nausea And Vomiting    Nausea and vomiting  . Hydrochlorothiazide Other (See Comments)    unknown  . Penicillins Hives    Hives   . Sulfonamide Derivatives Hives    Hives     Review of Systems negative except from HPI and PMH  Physical Exam BP 158/88 mmHg  Pulse 65  Ht 5\' 5"  (1.651 m)  Wt 79.289 kg (174 lb 12.8 oz)  BMI 29.09 kg/m2 Well developed and nourished in no acute distress HENT normal Neck supple with JVP-flat Clear Regular rate and rhythm, no murmurs or  gallops Abd-soft with active BS No Clubbing cyanosis edema Skin-warm and dry A & Oriented  Grossly normal sensory and motor function    Assessment and  Plan  dizziness-orthostatic  Hypertension  Atrial fibrillation    We'll continue her on Rivaroxaban indefinitely given her history of atrial fibrillation and risk factors  Her low pressure has been difficult to control.  For now we will increase her labetalol back to 300 mg twice daily and have her take her cut her valsartan half and take it 40 mg twice daily  If her blood pressure remains low she can stop her valsartan altogether. I have encouraged her to follow up with her PCP for management of this  As getting to [facility] is a challenge.   She also significant orthostatic hypotension. I am reluctant to use more medication although weakly. Mestinon 30-60 mg twice daily. Rather, we have talked about nonpharmacological approaches including the use of an abdominal binder. These are obtained locally at any pharmacy. They're warm from [place] op to sundown.

## 2014-08-05 ENCOUNTER — Telehealth: Payer: Self-pay | Admitting: Internal Medicine

## 2014-08-05 NOTE — Telephone Encounter (Signed)
New Msg   Patient doesn't understand diagnosis and has questions about a notation about a pacemaker because she states she doesn't have a pacemaker. [phone].

## 2014-08-05 NOTE — Telephone Encounter (Signed)
Explained that only diagnosis on last office visit was A Fib. (pacemaker dx was removed day of her visit - error). She also inquired about clarification of echo results - I reviewed those with her. Patient verbalized understanding and agreeable to plan.

## 2014-08-24 ENCOUNTER — Telehealth: Payer: Self-pay | Admitting: Internal Medicine

## 2014-08-24 ENCOUNTER — Emergency Department (HOSPITAL_COMMUNITY)
Admission: EM | Admit: 2014-08-24 | Discharge: 2014-08-24 | Disposition: A | Discharge status: Home / Self Care | Payer: Medicare Other | Attending: Emergency Medicine | Admitting: Emergency Medicine

## 2014-08-24 ENCOUNTER — Emergency Department (HOSPITAL_COMMUNITY): Payer: Medicare Other

## 2014-08-24 ENCOUNTER — Encounter (HOSPITAL_COMMUNITY): Payer: Self-pay | Admitting: Emergency Medicine

## 2014-08-24 DIAGNOSIS — Z7901 Long term (current) use of anticoagulants: Secondary | ICD-10-CM | POA: Insufficient documentation

## 2014-08-24 DIAGNOSIS — Z8639 Personal history of other endocrine, nutritional and metabolic disease: Secondary | ICD-10-CM | POA: Insufficient documentation

## 2014-08-24 DIAGNOSIS — Q676 Pectus excavatum: Secondary | ICD-10-CM | POA: Insufficient documentation

## 2014-08-24 DIAGNOSIS — Z880 Allergy status to penicillin: Secondary | ICD-10-CM | POA: Insufficient documentation

## 2014-08-24 DIAGNOSIS — R002 Palpitations: Secondary | ICD-10-CM | POA: Insufficient documentation

## 2014-08-24 DIAGNOSIS — R0602 Shortness of breath: Secondary | ICD-10-CM | POA: Insufficient documentation

## 2014-08-24 DIAGNOSIS — Z8739 Personal history of other diseases of the musculoskeletal system and connective tissue: Secondary | ICD-10-CM | POA: Insufficient documentation

## 2014-08-24 DIAGNOSIS — I4891 Unspecified atrial fibrillation: Secondary | ICD-10-CM | POA: Diagnosis present

## 2014-08-24 DIAGNOSIS — F419 Anxiety disorder, unspecified: Secondary | ICD-10-CM | POA: Insufficient documentation

## 2014-08-24 DIAGNOSIS — Z79899 Other long term (current) drug therapy: Secondary | ICD-10-CM | POA: Insufficient documentation

## 2014-08-24 DIAGNOSIS — I10 Essential (primary) hypertension: Secondary | ICD-10-CM | POA: Diagnosis not present

## 2014-08-24 DIAGNOSIS — Z8542 Personal history of malignant neoplasm of other parts of uterus: Secondary | ICD-10-CM | POA: Diagnosis not present

## 2014-08-24 LAB — CBC
HEMATOCRIT: 40.3 % (ref 36.0–46.0)
HEMOGLOBIN: 13.4 g/dL (ref 12.0–15.0)
MCH: 30.4 pg (ref 26.0–34.0)
MCHC: 33.3 g/dL (ref 30.0–36.0)
MCV: 91.4 fL (ref 78.0–100.0)
Platelets: 196 10*3/uL (ref 150–400)
RBC: 4.41 MIL/uL (ref 3.87–5.11)
RDW: 13.3 % (ref 11.5–15.5)
WBC: 5.8 10*3/uL (ref 4.0–10.5)

## 2014-08-24 LAB — I-STAT TROPONIN, ED: Troponin i, poc: 0.01 ng/mL (ref 0.00–0.08)

## 2014-08-24 LAB — BASIC METABOLIC PANEL
Anion gap: 15 (ref 5–15)
BUN: 14 mg/dL (ref 6–23)
CALCIUM: 9.5 mg/dL (ref 8.4–10.5)
CHLORIDE: 103 meq/L (ref 96–112)
CO2: 24 mEq/L (ref 19–32)
Creatinine, Ser: 0.66 mg/dL (ref 0.50–1.10)
GFR calc Af Amer: >90 mL/min (ref >90)
GFR calc non Af Amer: 82 mL/min — ABNORMAL LOW (ref >90)
Glucose, Bld: 111 mg/dL — ABNORMAL HIGH (ref 70–99)
Potassium: 4.2 mEq/L (ref 3.7–5.3)
Sodium: 142 mEq/L (ref 137–147)

## 2014-08-24 LAB — PROTIME-INR
INR: 2.04 — ABNORMAL HIGH (ref 0.00–1.49)
Prothrombin Time: 23.2 seconds — ABNORMAL HIGH (ref 11.6–15.2)

## 2014-08-24 MED ORDER — SODIUM CHLORIDE 0.9 % IV BOLUS (SEPSIS)
500.0000 mL | INTRAVENOUS | Status: AC
  Administered 2014-08-24: 500 mL via INTRAVENOUS

## 2014-08-24 MED ORDER — LABETALOL HCL 100 MG PO TABS
100.0000 mg | ORAL_TABLET | Freq: Once | ORAL | Status: AC
  Administered 2014-08-24: 100 mg via ORAL
  Filled 2014-08-24: qty 1

## 2014-08-24 NOTE — Telephone Encounter (Signed)
Pt called concern that when she takes her PM, BP medication, her BP goes up about 10 to 20 points up instead of going down. She said it happened a few minutes after taking the Pm  medication . I tried to explaned that for the medication to take effect it will, take about 1.5 hours. Pt got very upset and said " I know because I feel it when it goes up". "Is that the only recommendation you have for me" Pt takes three medication twice a day including Valsartan 80 mg which is order for once daily, but she take it twice a day. Pt's BP now is 134/80. Pt states she will watch her BP closer and she will call back if the problem continues.

## 2014-08-24 NOTE — ED Notes (Signed)
Per [facility]: pt. From home with complaint of increased "jittery feeling" starting at 1700. Hx of afib. 100-140 bpm. Pt. States recently increased dose of labetalol.

## 2014-08-24 NOTE — ED Provider Notes (Signed)
CSN: [id number]     Arrival date & time 08/24/14  1915 History   First MD Initiated Contact with Patient 08/24/14 1944     Chief Complaint  Patient presents with  . Atrial Fibrillation     (Consider location/radiation/quality/duration/timing/severity/associated sxs/prior Treatment) Patient is a 80 y.o. female presenting with atrial fibrillation. The history is provided by the patient.  Atrial Fibrillation This is a recurrent problem. The current episode started 3 to 5 hours ago. The problem occurs constantly. The problem has been resolved. Associated symptoms include shortness of breath. Pertinent negatives include no chest pain, no abdominal pain and no headaches. Nothing aggravates the symptoms. Nothing relieves the symptoms. She has tried nothing for the symptoms. The treatment provided significant relief.    Past Medical History  Diagnosis Date  . Hypertension   . Arthritis   . 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 Endometrial ca   . Anxiety   . HLD (hyperlipidemia)   . Cardiomegaly   . Pectus excavatum   . OA (osteoarthritis)   . Vertigo   . Hx of echocardiogram     a. Echo (11/15): Mild LVH, EF 55-60%, normal wall motion, grade 2 diastolic dysfunction, mild LAE, normal RV function, PASP 29 mm Hg   Past Surgical History  Procedure Laterality Date  . Cesarean section  1975  . Laparoscopic hysterectomy  2011  . Cataract extraction, bilateral Bilateral 2009  . Abdominal hysterectomy  2011   Family History  Problem Relation Age of Onset  . Heart attack Father   . Heart disease Father    History  Substance Use Topics  . Smoking status: Never Smoker   . Smokeless tobacco: Never Used  . Alcohol Use: No   OB History    No data available     Review of Systems  Constitutional: Negative for fever and fatigue.  HENT: Negative for congestion and  drooling.   Eyes: Negative for pain.  Respiratory: Positive for shortness of breath. Negative for cough.   Cardiovascular: Positive for palpitations. Negative for chest pain.  Gastrointestinal: Negative for nausea, vomiting, abdominal pain and diarrhea.  Genitourinary: Negative for dysuria and hematuria.  Musculoskeletal: Negative for back pain, gait problem and neck pain.  Skin: Negative for color change.  Neurological: Negative for dizziness and headaches.  Hematological: Negative for adenopathy.  Psychiatric/Behavioral: Negative for behavioral problems.  All other systems reviewed and are negative.     Allergies  Carbamazepine; Codeine; Hydrochlorothiazide; Penicillins; and Sulfonamide derivatives  Home Medications   Prior to Admission medications   Medication Sig Start Date End Date Taking? Authorizing Provider  acetaminophen (TYLENOL) 325 MG tablet Take 650 mg by mouth every 6 (six) hours as needed for headache.   Yes Historical Provider, MD  cholecalciferol (VITAMIN D) 1000 UNITS tablet Take 1,000 Units by mouth daily.   Yes Historical Provider, MD  felodipine (PLENDIL) 5 MG 24 hr tablet Take 5 mg by mouth daily. 1/2 tablet in the AM and 1/2 tablet in the PM.   Yes Historical Provider, MD  FLUoxetine (PROZAC) 10 MG tablet Take 10 mg by mouth daily.    Yes Historical Provider, MD  KLOR-CON M20 20 MEQ tablet 20 mEq daily. 1/2 tab daily. 10 MeQ 09/24/13  Yes Historical Provider, MD  labetalol (NORMODYNE) 200 MG tablet Take 1 tablet (200 mg total) by mouth 2 (two) times daily.  Takes 1 1/2 tablets bid Patient taking differently: Take 200 mg by mouth 2 (two) times daily. Takes extra 1/2 tab if needed. 07/10/14  Yes Scott Joylene Draft, PA-C  meclizine (ANTIVERT) 25 MG tablet Take 25 mg by mouth 2 (two) times daily. Dizziness   Yes Historical Provider, MD  PROCTOSOL HC 2.5 % rectal cream Place 1 application rectally as needed. hemrrohids 04/28/14  Yes Historical Provider, MD  valsartan  (DIOVAN) 80 MG tablet Take 80 mg by mouth 2 (two) times daily.    Yes Historical Provider, MD  XARELTO 20 MG TABS tablet TAKE ONE TABLET BY MOUTH ONCE DAILY. 10/11/13  Yes Deboraha Sprang, MD  zolpidem (AMBIEN) 5 MG tablet Take 5 mg by mouth at bedtime. For sleep   Yes Historical Provider, MD   BP 160/95 mmHg  Resp 16  SpO2 98% Physical Exam  Constitutional: She is oriented to person, place, and time. She appears well-developed and well-nourished.  HENT:  Head: Normocephalic.  Mouth/Throat: Oropharynx is clear and moist. No oropharyngeal exudate.  Eyes: Conjunctivae and EOM are normal. Pupils are equal, round, and reactive to light.  Neck: Normal range of motion. Neck supple.  Cardiovascular: Normal rate, regular rhythm, normal heart sounds and intact distal pulses.  Exam reveals no gallop and no friction rub.   No murmur heard. Pulmonary/Chest: Effort normal and breath sounds normal. No respiratory distress. She has no wheezes.  Abdominal: Soft. Bowel sounds are normal. There is no tenderness. There is no rebound and no guarding.  Musculoskeletal: Normal range of motion. She exhibits no edema or tenderness.  Neurological: She is alert and oriented to person, place, and time.  Skin: Skin is warm and dry.  Psychiatric: She has a normal mood and affect. Her behavior is normal.  Nursing note and vitals reviewed.   ED Course  Procedures (including critical care time) Labs Review Labs Reviewed  BASIC METABOLIC PANEL - Abnormal; Notable for the following:    Glucose, Bld 111 (*)    GFR calc non Af Amer 82 (*)    All other components within normal limits  PROTIME-INR - Abnormal; Notable for the following:    Prothrombin Time 23.2 (*)    INR 2.04 (*)    All other components within normal limits  CBC  I-STAT TROPOININ, ED    Imaging Review Dg Chest 2 View  08/24/2014   CLINICAL DATA:  Shortness of breath. Hypertension. Atrial fibrillation.  EXAM: CHEST  2 VIEW  COMPARISON:   02/28/2013  FINDINGS: Mild cardiomegaly, cardiothoracic index 56%, similar to prior. No edema. Thoracic spondylosis. Bony demineralization. The lungs appear clear.  IMPRESSION: 1. Mild enlargement of the cardiopericardial silhouette, without edema. 2. Thoracic spondylosis.   Electronically Signed   By: Sherryl Barters M.D.   On: 08/24/2014 21:56     EKG Interpretation   Date/Time:  Monday August 24 2014 19:24:04 EST Ventricular Rate:  105 PR Interval:    QRS Duration: 87 QT Interval:  341 QTC Calculation: 451 R Axis:   -51 Text Interpretation:  Age not entered, assumed to be  80 years old for  purpose of ECG interpretation Atrial fibrillation Left anterior fascicular  block Abnormal R-wave progression, early transition Left ventricular  hypertrophy Nonspecific T abnormalities, lateral leads Confirmed by  Jamaris Biernat  MD, Chevonne Bostrom ([id number]) on 08/24/2014 7:46:17 PM      MDM   Final diagnoses:  SOB (shortness of breath)  Palpitations    8:24 PM 81 y.o. female w hx of  PAF on xarelto, HTN on labetalol who presents with a sensation of mild shortness of breath and pulse racing which began around 5 PM after eating dinner. She states that her symptoms lasted approximately one hour. She also notes that her blood pressure was high at the time 160/90. She is afebrile and vital signs are unremarkable here. She appears well on exam. She denies any chest pain. I suspect her symptoms are consistent with known paroxysmal A. fib. She was recently instructed to start taking labetalol 300 mg twice a day. She states that she only took 200 mg this evening. We'll give her a small bolus and the additional 100 mg labetalol.  11:21 PM: I interpreted/reviewed the labs and/or imaging which were non-contributory.  Pt remains asx. Likely her PAF which caused her sx. I have discussed the diagnosis/risks/treatment options with the patient and believe the pt to be eligible for discharge home to follow-up with her  cardiologist as needed. We also discussed returning to the ED immediately if new or worsening sx occur. We discussed the sx which are most concerning (e.g., worsening palpitations, sob, cp) that necessitate immediate return. Medications administered to the patient during their visit and any new prescriptions provided to the patient are listed below.  Medications given during this visit Medications  labetalol (NORMODYNE) tablet 100 mg (100 mg Oral Given 08/24/14 2041)  sodium chloride 0.9 % bolus 500 mL (0 mLs Intravenous Stopped 08/24/14 2159)    New Prescriptions   No medications on file     Pamella Pert, MD 08/24/14 2330

## 2014-08-24 NOTE — Discharge Instructions (Signed)

## 2014-08-24 NOTE — Telephone Encounter (Signed)
New Msg    Pt states she is having a lot of neck pain and her bp is going up at night after taking meds. Please call [phone].

## 2014-08-25 ENCOUNTER — Telehealth: Payer: Self-pay | Admitting: Internal Medicine

## 2014-08-25 NOTE — Telephone Encounter (Signed)
New msg    Pt went to ER with Afib pulse very high, BP very high, wants to inform of this.    Please contact at [phone]

## 2014-08-25 NOTE — Telephone Encounter (Signed)
Patient nervous about taking Labetalol 300 mg twice daily when her BP sometimes runs low. She would like Dr. Olin Pia recommendations (such as take based upon BP reading).  She is having some SBP in low 100s. She is also experiencing dizziness at the same time. Will review with Dr. Caryl Comes.

## 2014-08-26 NOTE — Telephone Encounter (Signed)
Per Dr. Caryl Comes - advised to contact PCP to discuss BP control. Explained that before making a decision about possibly switching to another medication for afib control, Dr. Caryl Comes would like the BP controlled first - so that it isn't going from one extreme (high) to another (low). Patient is agreeable to this and will call back with plan/decision from PCP.

## 2014-09-02 ENCOUNTER — Other Ambulatory Visit: Payer: Self-pay | Admitting: Family Medicine

## 2014-09-02 DIAGNOSIS — I6523 Occlusion and stenosis of bilateral carotid arteries: Secondary | ICD-10-CM

## 2014-09-15 ENCOUNTER — Other Ambulatory Visit: Payer: Medicare Other

## 2014-10-15 ENCOUNTER — Telehealth: Payer: Self-pay | Admitting: Internal Medicine

## 2014-10-15 ENCOUNTER — Other Ambulatory Visit: Payer: Self-pay | Admitting: Internal Medicine

## 2014-10-15 NOTE — Telephone Encounter (Signed)
Spoke with patient who states she woke up yesterday morning with the left side of her head, arm, and leg feeling numb.  Patient states she thought she would not be able to walk but was able to ambulate slowly and as she began to walk, she felt better Patient states symptoms returned yesterday evening, about 5:30 pm and this morning she woke up with left leg stiffness Patient states she woke up on her back Denies chest pain, SOB; c/o fatigue; denies difficulty speaking or with speech - states she talked to several people on the phone yesterday and no one commented that she sounded strange Patient c/o mild headache yesterday, none today Patient states symptoms may be r/t history of arthritis but she is also concerned about stroke Patient states she is able to make fists with both hands with equal strength; denies weakness, facial drooping, or difficulty with speech/thought Patient had carotid ultrasound ordered by Dr. Dema Severin but states she has not had test done due to travel concerns during severe weather; patient would like to get test done here I advised patient that she needs to have carotid ultrasound done and I will check on getting her an appointment here and that she should call PCP for evaluation  I advised patient I will talk to Dr. Caryl Comes, who is in the office today, and I will call her back with his advice

## 2014-10-15 NOTE — Telephone Encounter (Signed)
I reviewed with Dr. Caryl Comes who advised patient see PCP for evaluation of possible TIA and to make certain patient is taking Xarelto. I called patient back and reviewed Dr. Olin Pia advice with her.  Patient states she is taking Xarelto as directed. Patient states she has an upcoming appointment with a neurologist for a bulging disc, but appointment is not until March.  I advised patient that numbness could be a result of impingement of nerves related to disc problems.  Patient states right now her arm is hurting and is numb.  I advised patient to call PCP office for appointment.  Patient verbalized understanding and agreement.

## 2014-10-15 NOTE — Telephone Encounter (Signed)
New problem   Pt work up yesterday morning and noticed her leg, arm and head on left side was numb and when she got up it was better. Pt stated it started back bothering her this morning. Please advise pt.

## 2014-10-16 ENCOUNTER — Other Ambulatory Visit: Payer: Self-pay | Admitting: *Deleted

## 2014-10-16 ENCOUNTER — Encounter: Payer: Self-pay | Admitting: Internal Medicine

## 2014-10-16 DIAGNOSIS — I7090 Unspecified atherosclerosis: Secondary | ICD-10-CM

## 2014-10-19 ENCOUNTER — Telehealth: Payer: Self-pay | Admitting: Internal Medicine

## 2014-10-19 NOTE — Telephone Encounter (Signed)
New Msg        Pt states that she had to call 911 Friday and was informed that she has a heart block.   Pt would like a call back to discuss some concerns.

## 2014-10-19 NOTE — Telephone Encounter (Signed)
Patient explains HR 135-140 & BP in 200s Friday night.  She states that EMS told her she had heart block. Patient wants to know what she should do about this. Informed patient that I would have Dr. Caryl Comes review EMS report/strips and we would call her with recommendations.  She is agreeable to this.

## 2014-10-26 ENCOUNTER — Encounter (HOSPITAL_COMMUNITY): Payer: Self-pay

## 2014-10-27 ENCOUNTER — Ambulatory Visit (HOSPITAL_COMMUNITY): Payer: Medicare Other | Attending: Cardiology | Admitting: *Deleted

## 2014-10-27 ENCOUNTER — Telehealth: Payer: Self-pay | Admitting: Internal Medicine

## 2014-10-27 DIAGNOSIS — I10 Essential (primary) hypertension: Secondary | ICD-10-CM | POA: Insufficient documentation

## 2014-10-27 DIAGNOSIS — E785 Hyperlipidemia, unspecified: Secondary | ICD-10-CM | POA: Insufficient documentation

## 2014-10-27 DIAGNOSIS — I6523 Occlusion and stenosis of bilateral carotid arteries: Secondary | ICD-10-CM

## 2014-10-27 DIAGNOSIS — R42 Dizziness and giddiness: Secondary | ICD-10-CM | POA: Diagnosis present

## 2014-10-27 DIAGNOSIS — I7090 Unspecified atherosclerosis: Secondary | ICD-10-CM

## 2014-10-27 NOTE — Telephone Encounter (Signed)
Walk in pt form " questions about meds" gave to Sherri P

## 2014-10-27 NOTE — Progress Notes (Signed)
Carotid Duplex Exam Performed 

## 2014-10-28 NOTE — Telephone Encounter (Signed)
Late Entry: Called patient 2/12 to update her and inform her that Dr. Caryl Comes had not been able to review this yet and we would call her once he has.

## 2014-10-29 NOTE — Telephone Encounter (Signed)
Patient states she is feeling much better. She finally got approval through insurance for Diovan and this is medication that works for her. She and I discussed Dr. Olin Pia findings (no heat block, may have been quick bout of AFib, see at follow up end of Feb.). We also reviewed heart block concerns.  She verbalized understanding of our conversation and agreeable to keep follow up at the end of the month.

## 2014-11-02 ENCOUNTER — Telehealth: Payer: Self-pay | Admitting: Internal Medicine

## 2014-11-02 NOTE — Telephone Encounter (Signed)
New Message      Patient is concerned about appt that is scheduled on 11/09/14 @ 815am. Patient states that the appt was changed and she is not happy about it.  Please give patient a call.

## 2014-11-02 NOTE — Telephone Encounter (Signed)
Tiffany Black had made the change and spoken with pt earlier today.  She will call pt again.

## 2014-11-03 NOTE — Telephone Encounter (Signed)
Faxed carotid doppler results to Dr. [name] office.

## 2014-11-04 ENCOUNTER — Telehealth: Payer: Self-pay | Admitting: Internal Medicine

## 2014-11-04 NOTE — Telephone Encounter (Signed)
New message     Pt c/o BP issue: STAT if pt c/o blurred vision, one-sided weakness or slurred speech  1. What are your last 5 BP readings? 164/101  Pulse 66  2. Are you having any other symptoms (ex. Dizziness, headache, blurred vision, passed out)? Very weak, did not sleep good last night 3. What is your BP issue? bp elevated----started new bp medication Monday---could this be the new medication?

## 2014-11-04 NOTE — Telephone Encounter (Signed)
New Msg         Pt calling, would like the results of the Carotid test.    Please return pt call.

## 2014-11-04 NOTE — Telephone Encounter (Addendum)
Reminded patient that she should call her PCP to address BP issues/control. Also reminded her that we discussed this on 08/26/14 advising her that BP control will not be followed by Dr. Caryl Comes. She went on about her PCP says she will not follow this and she has a BP doc but they are in [place] and she hasn't seen them for awhile. I explained that is she was not happy with her current PCP, then she might consider looking into another PCP or maybe she should consider re-establishing with her doctor in [place].  She also explained that her pharmacy recently filled her Labetalol with HCL, she did state that they were correcting this after she called to complain. She was not mad, but not happy that I could not do more for her. I tried to help her understand the reasoning behind this and explain that we have had this discussion before but she states she doesn't remember that and I must be confusing her with another patient. Conversation ended with her stating she will see if someone will help her.

## 2014-11-04 NOTE — Telephone Encounter (Signed)
Let patient know that Dr. Caryl Comes has not reviewed test results yet and we would call her once he has had a chance to review. I explained that I faxed results to Dr. Orest Dikes (ordering physician) office yesterday. She states she called them earlier and they have not had a chance to review either. She is aware it will be next week before Dr. Caryl Comes reviews and she may get results from Dr. Dema Severin sooner.

## 2014-11-09 ENCOUNTER — Ambulatory Visit (INDEPENDENT_AMBULATORY_CARE_PROVIDER_SITE_OTHER): Payer: Medicare Other | Admitting: Internal Medicine

## 2014-11-09 ENCOUNTER — Encounter: Payer: Self-pay | Admitting: Internal Medicine

## 2014-11-09 VITALS — BP 120/80 | HR 67 | Ht 65.0 in | Wt 176.6 lb

## 2014-11-09 DIAGNOSIS — I480 Paroxysmal atrial fibrillation: Secondary | ICD-10-CM

## 2014-11-09 MED ORDER — METOPROLOL TARTRATE 25 MG PO TABS: 25.0000 mg | ORAL_TABLET | Freq: Once | ORAL | Status: DC | PRN

## 2014-11-09 NOTE — Progress Notes (Signed)
Patient Care Team: [name], MD as PCP - General (Family Medicine)   HPI  Tiffany Black is a 81 y.o. female seen at the request of Dr. Martinique for atrial fibrillation . She also has a history of hypertension  She underwent an event recorder October 2013 by Dr. Shirlee More. He demonstrated symptoms with sinus rhythm and symptoms with PACs and symptoms with a 3 beat run of atrial tachycardia. Her symptoms have been largely quiescent; however, she saw Richardson Dopp 10/29 for palpitations. Event recorder was recommended. Labetalol doses were uptitrated as she was also hypertensive. She found that her blood pressure was too low and she decreased her medicines back to 200 mg twice daily. She is often valsartan and her blood pressures have been well controlled. Modest orthostasis is present. She is intercurrently hospitalized with a diagnosis of benign positional vertigo. She uses meclizine.   She's also had 2 episodes December 15 February 16 wearing she has had tachycardia palpitations associated with lightheadedness and fatigue. She has ECG monitoring which were reviewed. These demonstrated an atypical atrial flutter with rapid conduction    Echo 10/15 demonstrated normal LV function and mild LVH and left atrial enlargement   She had atrial fibrillation apparently documented at [facility] with Dr. Rosita Fire.  We dont have documentation    Her husband of 30 years died in 01/08/23. She and her son are doing pretty well  Past Medical History  Diagnosis Date  . Hypertension   . Arthritis   . 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 Endometrial ca   . Anxiety   . HLD (hyperlipidemia)   . Cardiomegaly   . Pectus excavatum   . OA (osteoarthritis)   . Vertigo   . Hx of echocardiogram     a. Echo (11/15): Mild LVH, EF 55-60%, normal wall motion, grade 2 diastolic  dysfunction, mild LAE, normal RV function, PASP 29 mm Hg    Past Surgical History  Procedure Laterality Date  . Cesarean section  1975  . Laparoscopic hysterectomy  2010/01/07  . Cataract extraction, bilateral Bilateral 01/08/2008  . Abdominal hysterectomy  Jan 07, 2010    Current Outpatient Prescriptions  Medication Sig Dispense Refill  . acetaminophen (TYLENOL) 325 MG tablet Take 650 mg by mouth every 6 (six) hours as needed for headache.    . cholecalciferol (VITAMIN D) 1000 UNITS tablet Take 1,000 Units by mouth daily.    . felodipine (PLENDIL) 5 MG 24 hr tablet Take 5 mg by mouth daily. 1/2 tablet in the AM and 1/2 tablet in the PM.    . FLUoxetine (PROZAC) 10 MG tablet Take 10 mg by mouth daily.     Marland Kitchen KLOR-CON M20 20 MEQ tablet 20 mEq daily. 1/2 tab daily. 10 MeQ    . labetalol (NORMODYNE) 200 MG tablet Take 200 mg by mouth 2 (two) times daily. 200 mg by mouth in the am and 300 mg by mouth in th pm...    . meclizine (ANTIVERT) 25 MG tablet Take 25 mg by mouth 2 (two) times daily. Dizziness    . PROCTOSOL HC 2.5 % rectal cream Place 1 application rectally as needed. hemrrohids    . XARELTO 20 MG TABS tablet TAKE ONE TABLET BY MOUTH ONCE DAILY 90 tablet 1  . zolpidem (AMBIEN) 5 MG tablet Take 5 mg by mouth at bedtime. For  sleep     No current facility-administered medications for this visit.    Allergies  Allergen Reactions  . Carbamazepine Other (See Comments)    unknown  . Codeine Nausea And Vomiting    Nausea and vomiting  . Hydrochlorothiazide Other (See Comments)    unknown  . Penicillins Hives    Hives   . Sulfonamide Derivatives Hives    Hives     Review of Systems negative except from HPI and PMH  Physical Exam BP 120/80 mmHg  Pulse 67  Ht 5\' 5"  (1.651 m)  Wt 176 lb 9.6 oz (80.105 kg)  BMI 29.39 kg/m2 Well developed and nourished in no acute distress HENT normal Neck supple with JVP-flat Clear Regular rate and rhythm, no murmurs or gallops Abd-soft with active BS No  Clubbing cyanosis edema Skin-warm and dry A & Oriented  Grossly normal sensory and motor function    Assessment and  Plan  dizziness-orthostatic  Hypertension  Atrial fibrillation -intercurrent atrial arrhythmias  benign positional vertigo      We'll continue her on Rivaroxaban indefinitely given her history of atrial fibrillation and risk factors; She is tolerating it well. She has had intercurrent tachypalpitations. We will give her low-dose metoprolol to take with these episodes. Tartrate 25 mg. She is to take it once prior to event so she knows how well she will tolerate it   Her low blood  pressure has been difficult to control.  she is on labetalol 200/300 mg twice daily;  no longer on valsartan.  She finds that certain labetalol formulations are more effective with blood pressures in all.

## 2014-11-09 NOTE — Patient Instructions (Addendum)
Your physician has recommended you make the following change in your medication:  1) START Metoprolol 25 mg once daily as needed for tachypalpitations.  Your physician has recommended you make the following change in your medication: 4 months with Dr. Francena Hanly.

## 2014-11-10 ENCOUNTER — Telehealth: Payer: Self-pay | Admitting: Internal Medicine

## 2014-11-10 NOTE — Telephone Encounter (Signed)
New message    Patient calling for echo  test results from  07/14/14

## 2014-11-10 NOTE — Telephone Encounter (Signed)
Reviewed echo results once again with patient. Patient was in the office yesterday, stated no one called her with echo results from November. Explained that we did call her, showed her documentation of the call on the computer , and reviewed results with her. Mailing results to patient. She is agreeable.

## 2014-11-25 ENCOUNTER — Telehealth: Payer: Self-pay

## 2014-11-26 NOTE — Telephone Encounter (Signed)
Advised patient she needs to contact her PCP for this matter.  Also, informed patient that Dr. Caryl Comes did not order this medication and cannot provide prior authorization for it. Patient verbalized understanding but still irritated b/c Dr. Dema Severin does not want to do it.  Discussed with patient, again, to try and find another physician that is willing to work with her if she is dissatisfied with her current physician.

## 2014-11-27 ENCOUNTER — Other Ambulatory Visit: Payer: Self-pay | Admitting: Neurological Surgery

## 2014-11-27 DIAGNOSIS — M47812 Spondylosis without myelopathy or radiculopathy, cervical region: Secondary | ICD-10-CM

## 2014-11-30 ENCOUNTER — Other Ambulatory Visit: Payer: Self-pay | Admitting: *Deleted

## 2014-11-30 MED ORDER — LABETALOL HCL 200 MG PO TABS: ORAL_TABLET | ORAL | Status: DC

## 2014-12-08 ENCOUNTER — Institutional Professional Consult (permissible substitution): Payer: Medicare Other | Admitting: Neurology

## 2014-12-19 ENCOUNTER — Inpatient Hospital Stay: Admission: RE | Admit: 2014-12-19 | Payer: Medicare Other | Source: Ambulatory Visit

## 2015-03-05 ENCOUNTER — Ambulatory Visit: Payer: Medicare Other | Admitting: Internal Medicine

## 2015-03-09 ENCOUNTER — Ambulatory Visit (INDEPENDENT_AMBULATORY_CARE_PROVIDER_SITE_OTHER): Payer: Medicare Other | Admitting: Internal Medicine

## 2015-03-09 ENCOUNTER — Encounter: Payer: Self-pay | Admitting: Internal Medicine

## 2015-03-09 VITALS — BP 134/82 | HR 65 | Ht 65.0 in | Wt 181.2 lb

## 2015-03-09 DIAGNOSIS — I480 Paroxysmal atrial fibrillation: Secondary | ICD-10-CM

## 2015-03-09 NOTE — Patient Instructions (Signed)
Medication Instructions:  Your physician recommends that you continue on your current medications as directed. Please refer to the Current Medication list given to you today.  Labwork: None ordered  Testing/Procedures: None ordered  Follow-Up: Your physician wants you to follow-up in: 6 months with Amber Seiler, NP.  You will receive a reminder letter in the mail two months in advance. If you don't receive a letter, please call our office to schedule the follow-up appointment.  Thank you for choosing [facility]!!          

## 2015-03-09 NOTE — Progress Notes (Signed)
Patient Care Team: Harlan Stains, MD as PCP - General (Family Medicine)   HPI  Tiffany Black is a 81 y.o. female seen at the request of Dr. Martinique for atrial fibrillation . She also has a history of hypertension  She underwent an event recorder October 2013 by Dr. Shirlee More. He demonstrated symptoms with sinus rhythm and symptoms with PACs and symptoms with a 3 beat run of atrial tachycardia. Her symptoms have been largely quiescent; however, she saw Richardson Dopp 10/29 for palpitations. Event recorder was recommended. Labetalol doses were uptitrated as she was also hypertensive. She found that her blood pressure was too low and she decreased her medicines back to 200 mg twice daily. She is often valsartan and her blood pressures have been well controlled. Modest orthostasis is present. She is intercurrently hospitalized with a diagnosis of benign positional vertigo. She uses meclizine.   She's also had 2 episodes December 15 February 16 wearing she has had tachycardia palpitations associated with lightheadedness and fatigue. She has ECG monitoring which were reviewed. These demonstrated an atypical atrial flutter with rapid conduction    Echo 10/15 demonstrated normal LV function and mild LVH and left atrial enlargement  Blood pressure is well-controlled. Dyspnea is minimal. There is no edema   Past Medical History  Diagnosis Date  . Hypertension   . Arthritis   . 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 Endometrial ca   . Anxiety   . HLD (hyperlipidemia)   . Cardiomegaly   . Pectus excavatum   . OA (osteoarthritis)   . Vertigo   . Hx of echocardiogram     a. Echo (11/15): Mild LVH, EF 55-60%, normal wall motion, grade 2 diastolic dysfunction, mild LAE, normal RV function, PASP 29 mm Hg    Past Surgical History  Procedure Laterality Date  . Cesarean section   1975  . Laparoscopic hysterectomy  2011  . Cataract extraction, bilateral Bilateral 2009  . Abdominal hysterectomy  2011    Current Outpatient Prescriptions  Medication Sig Dispense Refill  . acetaminophen (TYLENOL) 325 MG tablet Take 650 mg by mouth every 6 (six) hours as needed for headache.    . cholecalciferol (VITAMIN D) 1000 UNITS tablet Take 1,000 Units by mouth daily.    Marland Kitchen DIOVAN 160 MG tablet Take 80 mg by mouth 2 (two) times daily.  3  . felodipine (PLENDIL) 5 MG 24 hr tablet Take 5 mg by mouth daily. 1/2 tablet in the AM and 1/2 tablet in the PM.    . FLUoxetine (PROZAC) 10 MG tablet Take 10 mg by mouth daily.     Marland Kitchen KLOR-CON M20 20 MEQ tablet Take 10 mEq by mouth daily.     Marland Kitchen labetalol (NORMODYNE) 200 MG tablet Take 200 mg by mouth in the am, 200 mg by mouth in the pm, can take an extra 200 mg by mouth if BP will not come down.    . meclizine (ANTIVERT) 25 MG tablet Take 25 mg by mouth 2 (two) times daily. Dizziness    . metoprolol tartrate (LOPRESSOR) 25 MG tablet Take 1 tablet (25 mg total) by mouth once as needed. For tachypalpitations 10 tablet 0  . PROCTOSOL HC 2.5 % rectal cream Place 1 application rectally as needed. hemrrohids    . XARELTO 20 MG TABS tablet TAKE ONE TABLET BY MOUTH ONCE  DAILY 90 tablet 1  . zolpidem (AMBIEN) 5 MG tablet Take 5 mg by mouth at bedtime. For sleep     No current facility-administered medications for this visit.    Allergies  Allergen Reactions  . Carbamazepine Other (See Comments)    unknown  . Codeine Nausea And Vomiting    Nausea and vomiting  . Hydrochlorothiazide Other (See Comments)    unknown  . Penicillins Hives    Hives   . Sulfonamide Derivatives Hives    Hives     Review of Systems negative except from HPI and PMH  Physical Exam BP 134/82 mmHg  Pulse 65  Ht 5\' 5"  (1.651 m)  Wt 181 lb 3.2 oz (82.192 kg)  BMI 30.15 kg/m2 Well developed and nourished in no acute distress HENT normal Neck supple with  JVP-flat Clear Regular rate and rhythm, no murmurs or gallops Abd-soft with active BS No Clubbing cyanosis edema Skin-warm and dry A & Oriented  Grossly normal sensory and motor function  ECG demonstrates sinus rhythm at 65 Intervals 16/09/39 Left axis deviation Nonspecific ST-T changes  Assessment and  Plan  dizziness-orthostatic  Hypertension  Atrial fibrillation  Paroxysmal    Blood pressure is much improved. She's had occasional episode of atrial fibrillation. These records were reviewed from 12/15 where she presented with atrial fibrillation at a modestly rapid rate.  She expressed some concerns about communication with our office regarding medications

## 2015-03-09 NOTE — Progress Notes (Signed)
Patient Care Team: Harlan Stains, MD as PCP - General (Family Medicine)   HPI  Tiffany Black is a 81 y.o. female seen at the request of Dr. Martinique for atrial fibrillation . She also has a history of hypertension  She underwent an event recorder October 2013 by Dr. Shirlee More. He demonstrated symptoms with sinus rhythm and symptoms with PACs and symptoms with a 3 beat run of atrial tachycardia. Her symptoms have been largely quiescent; however, she saw Richardson Dopp 10/29 for palpitations. Event recorder was recommended. Labetalol doses were uptitrated as she was also hypertensive. She found that her blood pressure was too low and she decreased her medicines back to 200 mg twice daily. She is often valsartan and her blood pressures have been well controlled. Modest orthostasis is present. She is intercurrently hospitalized with a diagnosis of benign positional vertigo. She uses meclizine.   She's also had 2 episodes December 15 February 16 wearing she has had tachycardia palpitations associated with lightheadedness and fatigue. She has ECG monitoring which were reviewed. These demonstrated an atypical atrial flutter with rapid conduction    Echo 10/15 demonstrated normal LV function and mild LVH and left atrial enlargement   She had atrial fibrillation apparently documented at [facility] with Dr. Rosita Fire.  We dont have documentation    Her husband of 48 years died in 12/16/2022. She and her son are doing pretty well  Past Medical History  Diagnosis Date  . Hypertension   . Arthritis   . 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 Endometrial ca   . Anxiety   . HLD (hyperlipidemia)   . Cardiomegaly   . Pectus excavatum   . OA (osteoarthritis)   . Vertigo   . Hx of echocardiogram     a. Echo (11/15): Mild LVH, EF 55-60%, normal wall motion, grade 2 diastolic  dysfunction, mild LAE, normal RV function, PASP 29 mm Hg    Past Surgical History  Procedure Laterality Date  . Cesarean section  1975  . Laparoscopic hysterectomy  2009/12/15  . Cataract extraction, bilateral Bilateral 12/16/07  . Abdominal hysterectomy  12/15/2009    Current Outpatient Prescriptions  Medication Sig Dispense Refill  . acetaminophen (TYLENOL) 325 MG tablet Take 650 mg by mouth every 6 (six) hours as needed for headache.    . cholecalciferol (VITAMIN D) 1000 UNITS tablet Take 1,000 Units by mouth daily.    Marland Kitchen DIOVAN 160 MG tablet Take 80 mg by mouth 2 (two) times daily.  3  . felodipine (PLENDIL) 5 MG 24 hr tablet Take 5 mg by mouth daily. 1/2 tablet in the AM and 1/2 tablet in the PM.    . FLUoxetine (PROZAC) 10 MG tablet Take 10 mg by mouth daily.     Marland Kitchen KLOR-CON M20 20 MEQ tablet Take 10 mEq by mouth daily.     Marland Kitchen labetalol (NORMODYNE) 200 MG tablet Take 200 mg by mouth in the am, 200 mg by mouth in the pm, can take an extra 200 mg by mouth if BP will not come down.    . meclizine (ANTIVERT) 25 MG tablet Take 25 mg by mouth 2 (two) times daily. Dizziness    . metoprolol tartrate (LOPRESSOR) 25 MG tablet Take 1 tablet (25 mg total) by mouth once as needed. For tachypalpitations 10 tablet 0  . PROCTOSOL HC 2.5 %  rectal cream Place 1 application rectally as needed. hemrrohids    . XARELTO 20 MG TABS tablet TAKE ONE TABLET BY MOUTH ONCE DAILY 90 tablet 1  . zolpidem (AMBIEN) 5 MG tablet Take 5 mg by mouth at bedtime. For sleep     No current facility-administered medications for this visit.    Allergies  Allergen Reactions  . Carbamazepine Other (See Comments)    unknown  . Codeine Nausea And Vomiting    Nausea and vomiting  . Hydrochlorothiazide Other (See Comments)    unknown  . Penicillins Hives    Hives   . Sulfonamide Derivatives Hives    Hives     Review of Systems negative except from HPI and PMH  Physical Exam BP 134/82 mmHg  Pulse 65  Ht 5\' 5"  (1.651 m)  Wt 181  lb 3.2 oz (82.192 kg)  BMI 30.15 kg/m2 Well developed and nourished in no acute distress HENT normal Neck supple with JVP-flat Clear Regular rate and rhythm, no murmurs or gallops Abd-soft with active BS No Clubbing cyanosis edema Skin-warm and dry A & Oriented  Grossly normal sensory and motor function    Assessment and  Plan  dizziness-orthostatic  Hypertension  Atrial fibrillation -intercurrent atrial arrhythmias  benign positional vertigo      We'll continue her on Rivaroxaban indefinitely given her history of atrial fibrillation and risk factors; She is tolerating it well. She has had intercurrent tachypalpitations. We will give her low-dose metoprolol to take with these episodes. Tartrate 25 mg. She is to take it once prior to event so she knows how well she will tolerate it   Her low blood  pressure has been difficult to control.  she is on labetalol 200/300 mg twice daily;  no longer on valsartan.  She finds that certain labetalol formulations are more effective with blood pressures in all.

## 2015-04-05 ENCOUNTER — Emergency Department (INDEPENDENT_AMBULATORY_CARE_PROVIDER_SITE_OTHER)
Admission: EM | Admit: 2015-04-05 | Discharge: 2015-04-05 | Disposition: A | Discharge status: Home / Self Care | Payer: Medicare Other | Source: Home / Self Care | Attending: Emergency Medicine | Admitting: Emergency Medicine

## 2015-04-05 ENCOUNTER — Encounter (HOSPITAL_COMMUNITY): Payer: Self-pay | Admitting: Emergency Medicine

## 2015-04-05 DIAGNOSIS — S61002A Unspecified open wound of left thumb without damage to nail, initial encounter: Secondary | ICD-10-CM

## 2015-04-05 DIAGNOSIS — S61012A Laceration without foreign body of left thumb without damage to nail, initial encounter: Secondary | ICD-10-CM

## 2015-04-05 NOTE — Discharge Instructions (Signed)
Laceration Care, Adult A laceration is a cut that goes through all layers of the skin. The cut goes into the tissue beneath the skin. HOME CARE For wound glue:  You may shower or take baths. Do not soak or scrub the cut. Do not swim. Avoid heavy sweating until the glue falls off on its own. After a shower or bath, pat the cut dry with a clean towel.  Do not put medicine on your cut until the glue falls off.  If you have a bandage, do not put tape over the glue.  Avoid lots of sunlight or tanning lamps until the glue falls off. Put sunscreen on the cut for the first year to reduce your scar.  The glue will fall off on its own. Do not pick at the glue. You may need a tetanus shot if:  You cannot remember when you had your last tetanus shot.  You have never had a tetanus shot. If you need a tetanus shot and you choose not to have one, you may get tetanus. Sickness from tetanus can be serious. GET HELP RIGHT AWAY IF:   Your pain does not get better with medicine.  Your arm, hand, leg, or foot loses feeling (numbness) or changes color.  Your cut is bleeding.  Your joint feels weak, or you cannot use your joint.  You have painful lumps on your body.  Your cut is red, puffy (swollen), or painful.  You have a red line on the skin near the cut.  You have yellowish-white fluid (pus) coming from the cut.  You have a fever.  You have a bad smell coming from the cut or bandage.  Your cut breaks open before or after stitches are removed.  You notice something coming out of the cut, such as wood or glass.  You cannot move a finger or toe. MAKE SURE YOU:   Understand these instructions.  Will watch your condition.  Will get help right away if you are not doing well or get worse. Document Released: 02/14/2008 Document Revised: 11/20/2011 Document Reviewed: 02/21/2011 [facility] Patient Information 2015 [place], [place]. This information is not intended to replace advice given to  you by your health care provider. Make sure you discuss any questions you have with your health care provider.

## 2015-04-05 NOTE — ED Notes (Signed)
Pt has a left thumb laceration.. Pt states that she was putting up dishes and the she broke 2 glasses together and lacerated her thumb

## 2015-04-05 NOTE — ED Provider Notes (Signed)
CSN: [id number]     Arrival date & time 04/05/15  1705 History   First MD Initiated Contact with Patient 04/05/15 1911     Chief Complaint  Patient presents with  . Laceration   (Consider location/radiation/quality/duration/timing/severity/associated sxs/prior Treatment) HPI  She is a 81 year old woman here with her son for evaluation of left thumb laceration. She was washing some dishes when a glass broke. She cut her left thumb. This occurred around 4 this afternoon. Last tetanus shot was in 2014. She is on Xarelto for A. fib.  Past Medical History  Diagnosis Date  . Hypertension   . Arthritis   . 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 Endometrial ca   . Anxiety   . HLD (hyperlipidemia)   . Cardiomegaly   . Pectus excavatum   . OA (osteoarthritis)   . Vertigo   . Hx of echocardiogram     a. Echo (11/15): Mild LVH, EF 55-60%, normal wall motion, grade 2 diastolic dysfunction, mild LAE, normal RV function, PASP 29 mm Hg   Past Surgical History  Procedure Laterality Date  . Cesarean section  1975  . Laparoscopic hysterectomy  2011  . Cataract extraction, bilateral Bilateral 2009  . Abdominal hysterectomy  2011   Family History  Problem Relation Age of Onset  . Heart attack Father   . Heart disease Father    History  Substance Use Topics  . Smoking status: Never Smoker   . Smokeless tobacco: Never Used  . Alcohol Use: No   OB History    No data available     Review of Systems As in history of present illness Allergies  Carbamazepine; Codeine; Hydrochlorothiazide; Penicillins; and Sulfonamide derivatives  Home Medications   Prior to Admission medications   Medication Sig Start Date End Date Taking? Authorizing Provider  acetaminophen (TYLENOL) 325 MG tablet Take 650 mg by mouth every 6 (six) hours as needed for headache.    Historical  Provider, MD  cholecalciferol (VITAMIN D) 1000 UNITS tablet Take 1,000 Units by mouth daily.    Historical Provider, MD  DIOVAN 160 MG tablet Take 80 mg by mouth 2 (two) times daily. 02/21/15   Historical Provider, MD  felodipine (PLENDIL) 5 MG 24 hr tablet Take 5 mg by mouth daily. 1/2 tablet in the AM and 1/2 tablet in the PM.    Historical Provider, MD  FLUoxetine (PROZAC) 10 MG tablet Take 10 mg by mouth daily.     Historical Provider, MD  KLOR-CON M20 20 MEQ tablet Take 10 mEq by mouth daily.  09/24/13   Historical Provider, MD  labetalol (NORMODYNE) 200 MG tablet Take 200 mg by mouth in the am, 200 mg by mouth in the pm, can take an extra 200 mg by mouth if BP will not come down.    Historical Provider, MD  meclizine (ANTIVERT) 25 MG tablet Take 25 mg by mouth 2 (two) times daily. Dizziness    Historical Provider, MD  metoprolol tartrate (LOPRESSOR) 25 MG tablet Take 1 tablet (25 mg total) by mouth once as needed. For tachypalpitations 11/09/14   Deboraha Sprang, MD  PROCTOSOL HC 2.5 % rectal cream Place 1 application rectally as needed. hemrrohids 04/28/14   Historical Provider, MD  XARELTO 20 MG TABS tablet TAKE ONE TABLET BY MOUTH ONCE DAILY 10/16/14   Deboraha Sprang, MD  zolpidem [facility])  5 MG tablet Take 5 mg by mouth at bedtime. For sleep    Historical Provider, MD   BP 174/75 mmHg  Pulse 71  Temp(Src) 98 F (36.7 C) (Oral)  Resp 22  SpO2 96% Physical Exam  Constitutional: She is oriented to person, place, and time. She appears well-developed and well-nourished. No distress.  Cardiovascular: Normal rate.   Pulmonary/Chest: Effort normal.  Neurological: She is alert and oriented to person, place, and time.  Skin:  T shaped flap laceration over the left thenar eminence.  Brisk cap refill in distal thumb. Sensation grossly intact.    ED Course  LACERATION REPAIR Date/Time: 04/05/2015 7:46 PM Performed by: Melony Overly Authorized by: Melony Overly Consent: Verbal consent  obtained. Risks and benefits: risks, benefits and alternatives were discussed Consent given by: patient Patient understanding: patient states understanding of the procedure being performed Patient identity confirmed: verbally with patient Time out: Immediately prior to procedure a "time out" was called to verify the correct patient, procedure, equipment, support staff and site/side marked as required. Body area: upper extremity Location details: left thumb Laceration length: 1 cm Tendon involvement: none Nerve involvement: none Irrigation solution: tap water Irrigation method: tap Amount of cleaning: standard Debridement: none Degree of undermining: none Wound skin closure material used: Dermabond. Patient tolerance: Patient tolerated the procedure well with no immediate complications   (including critical care time) Labs Review Labs Reviewed - No data to display  Imaging Review No results found.   MDM   1. Laceration of left thumb, initial encounter    Dermabond applied. Wound care as in after visit summary. Follow-up as needed.    Melony Overly, MD 04/05/15 [phone]

## 2015-04-12 ENCOUNTER — Telehealth: Payer: Self-pay | Admitting: Internal Medicine

## 2015-04-12 NOTE — Telephone Encounter (Signed)
Pt states she checked her BP today the heart symbol came up on her BP monitor. Pt states BP 135/72 heart rate 67 regular, I asked pt to feel her pulse at her wrist and she said it felt regular. Pt denies any rapid heart rate, palpitations, dizziness/lightneadedness/ chest pain/SOB, or other symptoms.

## 2015-04-12 NOTE — Telephone Encounter (Signed)
New message    Patient calling wants to know should she come in to have an ekg - Her home machine is reading heart symbol.    Patient states her blood pressure is fine.     Patient states no chest pain , no sob.

## 2015-04-12 NOTE — Telephone Encounter (Signed)
Reviewed with Rosalio Loud, NP -no new recommendations at this time continue to monitor BP/pulse symptoms. Pt advised, pt requesting appt to have heart checked, she is concerned that something is going on with related to her atrial fibrillation. I reviewed with Chanetta Marshall, NP --she recommended pt see Roderic Palau, NP in the atrial fibrillation clinic. Pt has been scheduled to see Butch Penny in the [facility] 04/13/15 at [place], pt advised of appt time, given directions.

## 2015-04-13 ENCOUNTER — Encounter (HOSPITAL_COMMUNITY): Payer: Self-pay | Admitting: Nurse Practitioner

## 2015-04-13 ENCOUNTER — Ambulatory Visit (HOSPITAL_COMMUNITY)
Admission: RE | Admit: 2015-04-13 | Discharge: 2015-04-13 | Disposition: A | Discharge status: Home / Self Care | Payer: Medicare Other | Source: Ambulatory Visit | Attending: Nurse Practitioner | Admitting: Nurse Practitioner

## 2015-04-13 VITALS — BP 130/82 | HR 71 | Ht 65.0 in | Wt 181.6 lb

## 2015-04-13 DIAGNOSIS — I480 Paroxysmal atrial fibrillation: Secondary | ICD-10-CM | POA: Diagnosis present

## 2015-04-13 DIAGNOSIS — I10 Essential (primary) hypertension: Secondary | ICD-10-CM | POA: Insufficient documentation

## 2015-04-13 NOTE — Progress Notes (Signed)
Patient ID: Tiffany Black, female   DOB: 1934/06/18, 81 y.o.   MRN: [id number]     Primary Care Physician: [name], MD Referring Physician:Dr. Carlisia Geno is a 81 y.o. female with a h/o PAF, HTN, that asked to be seen in the afib clinic. Pt reports long history of dealing with high blood pressure and for the most part feels that it is under control. Now some days she feels that it might be too low. She describes that sometimes when checking her BP, the heart signal will show erratic, but her heart rate with this light is usually in the 60's/70's. She does have a metoprolol as a pill in the pocket to use if heart rate is elevated. She reports two epiosodes last year with rapid heart rate for which she called EMS. She has has varied questions re multiple health questions and seems to just want reassurance that what she is doing at home is ok and there are  no areas that need attention.  Today, she denies symptoms of palpitations, chest pain, shortness of breath, orthopnea, PND, lower extremity edema, dizziness, presyncope, syncope, or neurologic sequela. The patient is tolerating medications without difficulties and is otherwise without complaint today.   Past Medical History  Diagnosis Date  . Hypertension   . Arthritis   . 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 Endometrial ca   . Anxiety   . HLD (hyperlipidemia)   . Cardiomegaly   . Pectus excavatum   . OA (osteoarthritis)   . Vertigo   . Hx of echocardiogram     a. Echo (11/15): Mild LVH, EF 55-60%, normal wall motion, grade 2 diastolic dysfunction, mild LAE, normal RV function, PASP 29 mm Hg   Past Surgical History  Procedure Laterality Date  . Cesarean section  1975  . Laparoscopic hysterectomy  2011  . Cataract extraction, bilateral Bilateral 2009  . Abdominal hysterectomy  2011    Current  Outpatient Prescriptions  Medication Sig Dispense Refill  . acetaminophen (TYLENOL) 325 MG tablet Take 650 mg by mouth every 6 (six) hours as needed for headache.    . cholecalciferol (VITAMIN D) 1000 UNITS tablet Take 1,000 Units by mouth daily.    Marland Kitchen DIOVAN 160 MG tablet Take 80 mg by mouth 2 (two) times daily.  3  . felodipine (PLENDIL) 5 MG 24 hr tablet Take 5 mg by mouth daily. 1/2 tablet in the AM and 1/2 tablet in the PM.    . FLUoxetine (PROZAC) 10 MG tablet Take 10 mg by mouth daily.     Marland Kitchen KLOR-CON M20 20 MEQ tablet Take 10 mEq by mouth daily.     Marland Kitchen labetalol (NORMODYNE) 200 MG tablet Take 200 mg by mouth in the am, 200 mg by mouth in the pm, can take an extra 200 mg by mouth if BP will not come down.    . meclizine (ANTIVERT) 25 MG tablet Take 25 mg by mouth 2 (two) times daily. Dizziness    . metoprolol tartrate (LOPRESSOR) 25 MG tablet Take 1 tablet (25 mg total) by mouth once as needed. For tachypalpitations 10 tablet 0  . PROCTOSOL HC 2.5 % rectal cream Place 1 application rectally as needed. hemrrohids    . XARELTO 20 MG TABS tablet TAKE ONE TABLET BY MOUTH ONCE DAILY 90 tablet 1  . zolpidem (AMBIEN) 5  MG tablet Take 5 mg by mouth at bedtime. For sleep     No current facility-administered medications for this encounter.    Allergies  Allergen Reactions  . Carbamazepine Other (See Comments)    unknown  . Codeine Nausea And Vomiting    Nausea and vomiting  . Hydrochlorothiazide Other (See Comments)    unknown  . Penicillins Hives    Hives   . Sulfonamide Derivatives Hives    Hives     History   Social History  . Marital Status: Married    Spouse Name: N/A  . Number of Children: N/A  . Years of Education: N/A   Occupational History  . Retired    Social History Main Topics  . Smoking status: Never Smoker   . Smokeless tobacco: Never Used  . Alcohol Use: No  . Drug Use: No  . Sexual Activity: Yes   Other Topics Concern  . Not on file   Social History  Narrative    Family History  Problem Relation Age of Onset  . Heart attack Father   . Heart disease Father     ROS- All systems are reviewed and negative except as per the HPI above  Physical Exam: Filed Vitals:   04/13/15 0959  BP: 130/82  Pulse: 71  Height: 5\' 5"  (1.651 m)  Weight: 181 lb 9.6 oz (82.373 kg)    GEN- The patient is well appearing, alert and oriented x 3 today.   Head- normocephalic, atraumatic Eyes-  Sclera clear, conjunctiva pink Ears- hearing intact Oropharynx- clear Neck- supple, no JVP Lymph- no cervical lymphadenopathy Lungs- Clear to ausculation bilaterally, normal work of breathing Heart- Regular rate and rhythm, no murmurs, rubs or gallops, PMI not laterally displaced GI- soft, NT, ND, + BS Extremities- no clubbing, cyanosis, or edema MS- no significant deformity or atrophy Skin- no rash or lesion Psych- euthymic mood, full affect Neuro- strength and sensation are intact  EKG-  Normal Sinus rhythm, LAFB, nonspecific ST/T wave abnoramlity Epic records reviewed   Assessment and Plan: 1. PAF Burden sounds  overall low. Continue with current treatment .  2. HTN Appears to be well controlled. Some low readings in the am. She was told she could hold felodpine 5 mg in am if low and I tod her I thought was a reasonable thing to do.  3. Multiple physical complaints Complaints chronic and listened to her concerns.  F/u with Chanetta Marshall as scheduled  09/08/15 Afib clinic as needed.  Geroge Baseman Jalissa Heinzelman, [facility] [address] [place], [place] [address] [phone]

## 2015-04-21 ENCOUNTER — Other Ambulatory Visit: Payer: Self-pay | Admitting: Internal Medicine

## 2015-05-10 ENCOUNTER — Inpatient Hospital Stay (HOSPITAL_COMMUNITY)
Admission: EM | Admit: 2015-05-10 | Discharge: 2015-05-15 | DRG: 066 | Disposition: A | Discharge status: Home Health | Payer: Medicare Other | Attending: Internal Medicine | Admitting: Internal Medicine

## 2015-05-10 ENCOUNTER — Emergency Department (HOSPITAL_COMMUNITY): Payer: Medicare Other

## 2015-05-10 ENCOUNTER — Encounter (HOSPITAL_COMMUNITY): Payer: Self-pay | Admitting: *Deleted

## 2015-05-10 DIAGNOSIS — F329 Major depressive disorder, single episode, unspecified: Secondary | ICD-10-CM | POA: Diagnosis present

## 2015-05-10 DIAGNOSIS — I951 Orthostatic hypotension: Secondary | ICD-10-CM | POA: Diagnosis not present

## 2015-05-10 DIAGNOSIS — H8110 Benign paroxysmal vertigo, unspecified ear: Secondary | ICD-10-CM | POA: Diagnosis present

## 2015-05-10 DIAGNOSIS — I6389 Other cerebral infarction: Secondary | ICD-10-CM

## 2015-05-10 DIAGNOSIS — Z885 Allergy status to narcotic agent status: Secondary | ICD-10-CM

## 2015-05-10 DIAGNOSIS — Z79899 Other long term (current) drug therapy: Secondary | ICD-10-CM | POA: Diagnosis not present

## 2015-05-10 DIAGNOSIS — Z7901 Long term (current) use of anticoagulants: Secondary | ICD-10-CM

## 2015-05-10 DIAGNOSIS — F419 Anxiety disorder, unspecified: Secondary | ICD-10-CM | POA: Diagnosis not present

## 2015-05-10 DIAGNOSIS — E785 Hyperlipidemia, unspecified: Secondary | ICD-10-CM | POA: Diagnosis present

## 2015-05-10 DIAGNOSIS — G8929 Other chronic pain: Secondary | ICD-10-CM | POA: Diagnosis present

## 2015-05-10 DIAGNOSIS — Z791 Long term (current) use of non-steroidal anti-inflammatories (NSAID): Secondary | ICD-10-CM

## 2015-05-10 DIAGNOSIS — I639 Cerebral infarction, unspecified: Secondary | ICD-10-CM | POA: Diagnosis present

## 2015-05-10 DIAGNOSIS — I4819 Other persistent atrial fibrillation: Secondary | ICD-10-CM | POA: Diagnosis present

## 2015-05-10 DIAGNOSIS — R42 Dizziness and giddiness: Secondary | ICD-10-CM | POA: Diagnosis present

## 2015-05-10 DIAGNOSIS — I517 Cardiomegaly: Secondary | ICD-10-CM | POA: Diagnosis present

## 2015-05-10 DIAGNOSIS — I480 Paroxysmal atrial fibrillation: Secondary | ICD-10-CM | POA: Diagnosis present

## 2015-05-10 DIAGNOSIS — Z888 Allergy status to other drugs, medicaments and biological substances status: Secondary | ICD-10-CM | POA: Diagnosis not present

## 2015-05-10 DIAGNOSIS — Z8673 Personal history of transient ischemic attack (TIA), and cerebral infarction without residual deficits: Secondary | ICD-10-CM | POA: Diagnosis present

## 2015-05-10 DIAGNOSIS — Z880 Allergy status to penicillin: Secondary | ICD-10-CM | POA: Diagnosis not present

## 2015-05-10 DIAGNOSIS — Z8542 Personal history of malignant neoplasm of other parts of uterus: Secondary | ICD-10-CM | POA: Diagnosis not present

## 2015-05-10 DIAGNOSIS — Z882 Allergy status to sulfonamides status: Secondary | ICD-10-CM

## 2015-05-10 DIAGNOSIS — M542 Cervicalgia: Secondary | ICD-10-CM | POA: Diagnosis present

## 2015-05-10 DIAGNOSIS — N390 Urinary tract infection, site not specified: Secondary | ICD-10-CM

## 2015-05-10 DIAGNOSIS — I6350 Cerebral infarction due to unspecified occlusion or stenosis of unspecified cerebral artery: Secondary | ICD-10-CM | POA: Diagnosis not present

## 2015-05-10 DIAGNOSIS — I10 Essential (primary) hypertension: Secondary | ICD-10-CM | POA: Diagnosis not present

## 2015-05-10 DIAGNOSIS — I63311 Cerebral infarction due to thrombosis of right middle cerebral artery: Secondary | ICD-10-CM | POA: Diagnosis not present

## 2015-05-10 LAB — I-STAT TROPONIN, ED: Troponin i, poc: 0 ng/mL (ref 0.00–0.08)

## 2015-05-10 LAB — CBC WITH DIFFERENTIAL/PLATELET
BASOS ABS: 0 10*3/uL (ref 0.0–0.1)
Basophils Relative: 1 % (ref 0–1)
EOS ABS: 0.2 10*3/uL (ref 0.0–0.7)
EOS PCT: 3 % (ref 0–5)
HCT: 43.8 % (ref 36.0–46.0)
Hemoglobin: 14.3 g/dL (ref 12.0–15.0)
LYMPHS PCT: 22 % (ref 12–46)
Lymphs Abs: 1.6 10*3/uL (ref 0.7–4.0)
MCH: 30.8 pg (ref 26.0–34.0)
MCHC: 32.6 g/dL (ref 30.0–36.0)
MCV: 94.2 fL (ref 78.0–100.0)
Monocytes Absolute: 0.6 10*3/uL (ref 0.1–1.0)
Monocytes Relative: 8 % (ref 3–12)
Neutro Abs: 5 10*3/uL (ref 1.7–7.7)
Neutrophils Relative %: 68 % (ref 43–77)
PLATELETS: 210 10*3/uL (ref 150–400)
RBC: 4.65 MIL/uL (ref 3.87–5.11)
RDW: 13.3 % (ref 11.5–15.5)
WBC: 7.4 10*3/uL (ref 4.0–10.5)

## 2015-05-10 LAB — URINE MICROSCOPIC-ADD ON

## 2015-05-10 LAB — URINALYSIS, ROUTINE W REFLEX MICROSCOPIC
BILIRUBIN URINE: NEGATIVE
Glucose, UA: NEGATIVE mg/dL
Hgb urine dipstick: NEGATIVE
KETONES UR: NEGATIVE mg/dL
NITRITE: NEGATIVE
Protein, ur: NEGATIVE mg/dL
Specific Gravity, Urine: 1.011 (ref 1.005–1.030)
UROBILINOGEN UA: 0.2 mg/dL (ref 0.0–1.0)
pH: 7.5 (ref 5.0–8.0)

## 2015-05-10 LAB — BASIC METABOLIC PANEL
Anion gap: 7 (ref 5–15)
BUN: 11 mg/dL (ref 6–20)
CALCIUM: 9.4 mg/dL (ref 8.9–10.3)
CO2: 26 mmol/L (ref 22–32)
Chloride: 107 mmol/L (ref 101–111)
Creatinine, Ser: 0.86 mg/dL (ref 0.44–1.00)
GFR calc Af Amer: >60 mL/min (ref >60)
Glucose, Bld: 98 mg/dL (ref 65–99)
POTASSIUM: 4.4 mmol/L (ref 3.5–5.1)
Sodium: 140 mmol/L (ref 135–145)

## 2015-05-10 LAB — I-STAT CG4 LACTIC ACID, ED: Lactic Acid, Venous: 1.63 mmol/L (ref 0.5–2.0)

## 2015-05-10 LAB — TROPONIN I: Troponin I: <0.03 ng/mL (ref <0.031)

## 2015-05-10 MED ORDER — SODIUM CHLORIDE 0.9 % IV SOLN
INTRAVENOUS | Status: DC
  Administered 2015-05-10 – 2015-05-11 (×2): via INTRAVENOUS

## 2015-05-10 MED ORDER — MECLIZINE HCL 25 MG PO TABS
25.0000 mg | ORAL_TABLET | Freq: Once | ORAL | Status: AC
  Administered 2015-05-10: 25 mg via ORAL
  Filled 2015-05-10: qty 1

## 2015-05-10 MED ORDER — MECLIZINE HCL 12.5 MG PO TABS
25.0000 mg | ORAL_TABLET | Freq: Two times a day (BID) | ORAL | Status: DC | PRN
Start: 2015-05-10 — End: 2015-05-12
  Administered 2015-05-11 – 2015-05-12 (×2): 25 mg via ORAL
  Filled 2015-05-10 (×2): qty 2

## 2015-05-10 MED ORDER — ASPIRIN 81 MG PO CHEW
324.0000 mg | CHEWABLE_TABLET | Freq: Once | ORAL | Status: AC
  Administered 2015-05-10: 324 mg via ORAL
  Filled 2015-05-10: qty 4

## 2015-05-10 MED ORDER — FLUOXETINE HCL 20 MG PO TABS
10.0000 mg | ORAL_TABLET | Freq: Every day | ORAL | Status: DC
  Administered 2015-05-10: 10 mg via ORAL
  Filled 2015-05-10 (×2): qty 1

## 2015-05-10 MED ORDER — DIAZEPAM 5 MG PO TABS
5.0000 mg | ORAL_TABLET | Freq: Once | ORAL | Status: AC
  Filled 2015-05-10: qty 1

## 2015-05-10 MED ORDER — ACETAMINOPHEN 325 MG PO TABS
650.0000 mg | ORAL_TABLET | Freq: Four times a day (QID) | ORAL | Status: DC | PRN
  Administered 2015-05-11 – 2015-05-14 (×4): 650 mg via ORAL
  Filled 2015-05-10 (×4): qty 2

## 2015-05-10 MED ORDER — DIAZEPAM 5 MG PO TABS
5.0000 mg | ORAL_TABLET | Freq: Once | ORAL | Status: AC
  Administered 2015-05-10: 5 mg via ORAL
  Filled 2015-05-10: qty 1

## 2015-05-10 MED ORDER — ZOLPIDEM TARTRATE 5 MG PO TABS
5.0000 mg | ORAL_TABLET | Freq: Every evening | ORAL | Status: DC | PRN
  Administered 2015-05-10 – 2015-05-14 (×5): 5 mg via ORAL
  Filled 2015-05-10 (×6): qty 1

## 2015-05-10 MED ORDER — CIPROFLOXACIN IN D5W 400 MG/200ML IV SOLN
400.0000 mg | Freq: Once | INTRAVENOUS | Status: AC
  Administered 2015-05-10: 400 mg via INTRAVENOUS
  Filled 2015-05-10: qty 200

## 2015-05-10 MED ORDER — LABETALOL HCL 200 MG PO TABS
200.0000 mg | ORAL_TABLET | Freq: Two times a day (BID) | ORAL | Status: DC
  Administered 2015-05-10: 200 mg via ORAL
  Filled 2015-05-10: qty 1

## 2015-05-10 MED ORDER — STROKE: EARLY STAGES OF RECOVERY BOOK
Freq: Once | Status: AC
  Administered 2015-05-10: 17:00:00
  Filled 2015-05-10: qty 1

## 2015-05-10 MED ORDER — RIVAROXABAN 20 MG PO TABS
20.0000 mg | ORAL_TABLET | Freq: Every day | ORAL | Status: DC
  Administered 2015-05-10 – 2015-05-14 (×5): 20 mg via ORAL
  Filled 2015-05-10 (×5): qty 1

## 2015-05-10 MED ORDER — SENNOSIDES-DOCUSATE SODIUM 8.6-50 MG PO TABS: 1.0000 | ORAL_TABLET | Freq: Every evening | ORAL | Status: DC | PRN

## 2015-05-10 NOTE — H&P (Signed)
[facility] History and Physical  Tiffany Black ZHY:[id number] DOB: 11-16-1933 DOA: 05/10/2015  Referring physician: EDP PCP: [name], MD   Chief Complaint: Dizziness  HPI: Tiffany Black is a 81 y.o. female  with past medical history of essential hypertension, Vertigo, ? BPPV, P.Afib on Xarelto, Dyslipidemia presents to the ER with the above complaints. Pt has chronic intermittent dizziness that is sometimes relieved with meclizine. She reports intermittent fluctuation in her BP in the last 2 months which improved over the last 2 weeks. Yesterday she felt dizzy with standing or sitting up and hence didn't have a good day, no other symptoms. -used her meclizine yesterday, this morning on waking up had persistence of her dizziness and hence her son brought her to the ER. No nausea, vomiting, no fevers or chills, no diarrhea. In ER, Orthostatic BP 145/72 lying to 125/65 standing. MRI shows tiny R temporal CVA. She denies any new weakness, numbness, speech disturbance etc. UA slightly abnormal but no symptoms  Review of Systems: positives bolded Constitutional:  No weight loss, night sweats, Fevers, chills, fatigue.  HEENT:  No headaches, Difficulty swallowing,Tooth/dental problems,Sore throat,  No sneezing, itching, ear ache, nasal congestion, post nasal drip,  Cardio-vascular:  No chest pain, Orthopnea, PND, swelling in lower extremities, anasarca, dizziness, palpitations  GI:  No heartburn, indigestion, abdominal pain, nausea, vomiting, diarrhea, change in bowel habits, loss of appetite  Resp:  No shortness of breath with exertion or at rest. No excess mucus, no productive cough, No non-productive cough, No coughing up of blood.No change in color of mucus.No wheezing.No chest wall deformity  Skin:  no rash or lesions.  GU:  no dysuria, change in color of urine, no urgency or frequency. No flank pain.  Musculoskeletal:  No joint pain or swelling. No decreased range of  motion. No back pain.  Psych:  No change in mood or affect. No depression or anxiety. No memory loss.   Past Medical History  Diagnosis Date  . Hypertension   . Arthritis   . 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 Endometrial ca   . Anxiety   . HLD (hyperlipidemia)   . Cardiomegaly   . Pectus excavatum   . OA (osteoarthritis)   . Vertigo   . Hx of echocardiogram     a. Echo (11/15): Mild LVH, EF 55-60%, normal wall motion, grade 2 diastolic dysfunction, mild LAE, normal RV function, PASP 29 mm Hg   Past Surgical History  Procedure Laterality Date  . Cesarean section  1975  . Laparoscopic hysterectomy  2011  . Cataract extraction, bilateral Bilateral 2009  . Abdominal hysterectomy  2011   Social History:  reports that she has never smoked. She has never used smokeless tobacco. She reports that she does not drink alcohol or use illicit drugs.  Allergies  Allergen Reactions  . Carbamazepine Other (See Comments)    unknown  . Codeine Nausea And Vomiting    Nausea and vomiting  . Hydrochlorothiazide Other (See Comments)    unknown  . Penicillins Hives    Hives   . Sulfonamide Derivatives Hives    Hives     Family History  Problem Relation Age of Onset  . Heart attack Father   . Heart disease Father     Prior to Admission medications   Medication Sig Start Date End Date Taking? Authorizing Provider  acetaminophen (  TYLENOL) 325 MG tablet Take 650 mg by mouth every 6 (six) hours as needed for headache.    Historical Provider, MD  cholecalciferol (VITAMIN D) 1000 UNITS tablet Take 1,000 Units by mouth daily.    Historical Provider, MD  DIOVAN 160 MG tablet Take 80 mg by mouth 2 (two) times daily. 02/21/15   Historical Provider, MD  felodipine (PLENDIL) 5 MG 24 hr tablet Take 5 mg by mouth daily. 1/2 tablet in the AM and 1/2 tablet in the PM.     Historical Provider, MD  FLUoxetine (PROZAC) 10 MG tablet Take 10 mg by mouth daily.     Historical Provider, MD  KLOR-CON M20 20 MEQ tablet Take 10 mEq by mouth daily.  09/24/13   Historical Provider, MD  labetalol (NORMODYNE) 200 MG tablet Take 200 mg by mouth in the am, 200 mg by mouth in the pm, can take an extra 200 mg by mouth if BP will not come down.    Historical Provider, MD  meclizine (ANTIVERT) 25 MG tablet Take 25 mg by mouth 2 (two) times daily. Dizziness    Historical Provider, MD  metoprolol tartrate (LOPRESSOR) 25 MG tablet Take 1 tablet (25 mg total) by mouth once as needed. For tachypalpitations 11/09/14   Deboraha Sprang, MD  PROCTOSOL HC 2.5 % rectal cream Place 1 application rectally as needed. hemrrohids 04/28/14   Historical Provider, MD  XARELTO 20 MG TABS tablet TAKE ONE TABLET BY MOUTH ONCE DAILY 04/21/15   Deboraha Sprang, MD  zolpidem (AMBIEN) 5 MG tablet Take 5 mg by mouth at bedtime. For sleep    Historical Provider, MD   Physical Exam: Filed Vitals:   05/10/15 1315 05/10/15 1330 05/10/15 1345 05/10/15 1400  BP: 157/74 169/78 175/70 177/82  Pulse: 60 58 58 61  Temp:      TempSrc:      Resp: 14 14 16 12   Height:      Weight:      SpO2: 98% 98% 99% 98%    Wt Readings from Last 3 Encounters:  05/10/15 81.647 kg (180 lb)  04/13/15 82.373 kg (181 lb 9.6 oz)  03/09/15 82.192 kg (181 lb 3.2 oz)    General:  Appears calm and comfortable Eyes: PERRL, normal lids, irises & conjunctiva ENT: grossly normal hearing, lips & tongue Neck: no LAD, masses or thyromegaly Cardiovascular: irregular, no m/r/g. No LE edema. Telemetry: SR, no arrhythmias  Respiratory: CTA bilaterally, no w/r/r. Normal respiratory effort. Abdomen: soft, ntnd Skin: no rash or induration seen on limited exam Musculoskeletal: grossly normal tone BUE/BLE Psychiatric: grossly normal mood and affect, speech fluent and appropriate Neurologic: grossly non-focal, no nystagmus, motor 5/5, sensory light  touch intact plantars withdrawal, DTR 1 plus          Labs on Admission:  Basic Metabolic Panel:  Recent Labs Lab 05/10/15 1052  NA 140  K 4.4  CL 107  CO2 26  GLUCOSE 98  BUN 11  CREATININE 0.86  CALCIUM 9.4   Liver Function Tests: No results for input(s): AST, ALT, ALKPHOS, BILITOT, PROT, ALBUMIN in the last 168 hours. No results for input(s): LIPASE, AMYLASE in the last 168 hours. No results for input(s): AMMONIA in the last 168 hours. CBC:  Recent Labs Lab 05/10/15 1052  WBC 7.4  NEUTROABS 5.0  HGB 14.3  HCT 43.8  MCV 94.2  PLT 210   Cardiac Enzymes:  Recent Labs Lab 05/10/15 1052  TROPONINI <0.03    BNP (  last 3 results) No results for input(s): BNP in the last 8760 hours.  ProBNP (last 3 results) No results for input(s): PROBNP in the last 8760 hours.  CBG: No results for input(s): GLUCAP in the last 168 hours.  Radiological Exams on Admission: Dg Chest 2 View  05/10/2015   CLINICAL DATA:  Dizziness and weakness for 5 days.  EXAM: CHEST  2 VIEW  COMPARISON:  08/24/2014  FINDINGS: Lungs are clear without airspace disease or pulmonary edema. Heart and mediastinum are within normal limits. No large pleural effusions. Degenerative changes in the thoracic spine.  IMPRESSION: No active cardiopulmonary disease.   Electronically Signed   By: Markus Daft M.D.   On: 05/10/2015 11:05   [name] Contrast  05/10/2015   CLINICAL DATA:  81 year old female with history of vertigo since 2014. Medication not helping. Dizziness on motion. History of hypertension, atrial fibrillation, endometrial carcinoma and hyperlipidemia. Initial encounter.  EXAM: MRI HEAD WITHOUT CONTRAST  TECHNIQUE: Multiplanar, multiecho pulse sequences of the brain and surrounding structures were obtained without intravenous contrast.  COMPARISON:  02/27/2013.  FINDINGS: Exam is motion degraded.  Tiny acute nonhemorrhagic posterior right temporal lobe infarct.  Remote left frontal lobe infarcts  with wallerian degeneration and left cerebral peduncle mineral deposition unchanged.  Tiny blood breakdown products right middle cerebral peduncle and left frontal lobe consistent with prior hemorrhagic ischemia without acute intracranial hemorrhage noted.  Small vessel disease type changes most notable periventricular region.  Mild atrophy without hydrocephalus.  No intracranial mass lesion noted on this unenhanced exam.  Major intracranial vascular structures are patent.  Mild transverse ligament hypertrophy. Facet joint degenerative changes left aspect upper cervical spine.  Cervical medullary junction and pineal region unremarkable.  Partially empty non expanded sella felt to be an incidental finding.  Post lens replacement. Slight prominence peri optic spaces, within normal limits.  Paranasal sinus mucosal thickening most notable left maxillary sinus.  IMPRESSION: Exam is motion degraded.  Tiny acute nonhemorrhagic posterior right temporal lobe infarct.  Remote left frontal lobe infarcts with wallerian degeneration unchanged.  Small vessel disease type changes most notable periventricular region.  Mild atrophy without hydrocephalus.  Paranasal sinus mucosal thickening most notable left maxillary sinus.   Electronically Signed   By: Genia Del M.D.   On: 05/10/2015 12:52    EKG: Independently reviewed. NSR, PACs , no acute St T wave changes  Assessment/Plan    Stroke -tiny R temporal CVA on MRI -incidental finding -continue Xarelto for p.Afib/secondary stroke prevention -neuro consulting -check ECHO, carotid duplex, MRA -check Hbaic, LDL -PT/OT/SLP    Dizziness -has BPPV at baseline and now Orthostatic hypotension -hydrate, hold BP meds -Pt eval -meclizine PRN -temporal stroke will not cause these symptoms    Essential hypertension, benign -hold Plendil, ARB in setting of acute CVA and Orthostatic hypotension    P.Atrial fibrillation -in NSR with PaCs now -continue labetalol and  xarelto    Abnormal UA/asymptomatic pyuria -no symptoms of UTI, received cipro in ER -FU cultures, no indication for Abx now    Anxiety/Depression -continue prozac  Code Status: Full Code DVT Prophylaxis: on xarelto Family Communication: son at bedside Disposition Plan: inpatient  Time spent: 18min  Jeremey Bascom [facility] Pager [phone]

## 2015-05-10 NOTE — Progress Notes (Deleted)
Referring Physician: Broadus John    Chief Complaint: Dizziness  HPI:                                                                                                                                         Tiffany Black is an 81 y.o. female with known PAF on Xeralto and chronic vertigo brought to ED after having 2 days of feeling dizzy and not stable on her feet.  She denies vertigo but states she just felt as if she was having another bought of her usual BPPV. MRI was obtained while in ED and showed a single punctate tiny infarct in the right parietal lobe. Neurology was called due to this finding.  She has no weakness, numbness, extinction.   Date last known well: Date: 05/09/2015 Time last known well: Unable to determine tPA Given: No: no symptoms    Past Medical History  Diagnosis Date  . Hypertension   . Arthritis   . 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 Endometrial ca   . Anxiety   . HLD (hyperlipidemia)   . Cardiomegaly   . Pectus excavatum   . OA (osteoarthritis)   . Vertigo   . Hx of echocardiogram     a. Echo (11/15): Mild LVH, EF 55-60%, normal wall motion, grade 2 diastolic dysfunction, mild LAE, normal RV function, PASP 29 mm Hg    Past Surgical History  Procedure Laterality Date  . Cesarean section  1975  . Laparoscopic hysterectomy  2011  . Cataract extraction, bilateral Bilateral 2009  . Abdominal hysterectomy  2011    Family History  Problem Relation Age of Onset  . Heart attack Father   . Heart disease Father    Social History:  reports that she has never smoked. She has never used smokeless tobacco. She reports that she does not drink alcohol or use illicit drugs. Family history: no MS, brain tumor, or brain aneurysm. Allergies:  Allergies  Allergen Reactions  . Carbamazepine Other (See Comments)    unknown  . Codeine Nausea And  Vomiting    Nausea and vomiting  . Hydrochlorothiazide Other (See Comments)    unknown  . Penicillins Hives    Hives   . Sulfonamide Derivatives Hives    Hives     Medications:  Current Facility-Administered Medications  Medication Dose Route Frequency Provider Last Rate Last Dose  . ciprofloxacin (CIPRO) IVPB 400 mg  400 mg Intravenous Once Evelina Bucy, MD 200 mL/hr at 05/10/15 1348 400 mg at 05/10/15 1348   Current Outpatient Prescriptions  Medication Sig Dispense Refill  . acetaminophen (TYLENOL) 325 MG tablet Take 650 mg by mouth every 6 (six) hours as needed for headache.    . cholecalciferol (VITAMIN D) 1000 UNITS tablet Take 1,000 Units by mouth daily.    Marland Kitchen DIOVAN 160 MG tablet Take 80 mg by mouth 2 (two) times daily.  3  . felodipine (PLENDIL) 5 MG 24 hr tablet Take 5 mg by mouth daily. 1/2 tablet in the AM and 1/2 tablet in the PM.    . FLUoxetine (PROZAC) 10 MG tablet Take 10 mg by mouth daily.     Marland Kitchen KLOR-CON M20 20 MEQ tablet Take 10 mEq by mouth daily.     Marland Kitchen labetalol (NORMODYNE) 200 MG tablet Take 200 mg by mouth in the am, 200 mg by mouth in the pm, can take an extra 200 mg by mouth if BP will not come down.    . meclizine (ANTIVERT) 25 MG tablet Take 25 mg by mouth 2 (two) times daily. Dizziness    . metoprolol tartrate (LOPRESSOR) 25 MG tablet Take 1 tablet (25 mg total) by mouth once as needed. For tachypalpitations 10 tablet 0  . PROCTOSOL HC 2.5 % rectal cream Place 1 application rectally as needed. hemrrohids    . XARELTO 20 MG TABS tablet TAKE ONE TABLET BY MOUTH ONCE DAILY 90 tablet 0  . zolpidem (AMBIEN) 5 MG tablet Take 5 mg by mouth at bedtime. For sleep    \   ROS:                                                                                                                                       History obtained from the  patient  General ROS: negative for - chills, fatigue, fever, night sweats, weight gain or weight loss Psychological ROS: negative for - behavioral disorder, hallucinations, memory difficulties, mood swings or suicidal ideation Ophthalmic ROS: negative for - blurry vision, double vision, eye pain or loss of vision ENT ROS: negative for - epistaxis, nasal discharge, oral lesions, sore throat, tinnitus or vertigo Allergy and Immunology ROS: negative for - hives or itchy/watery eyes Hematological and Lymphatic ROS: negative for - bleeding problems, bruising or swollen lymph nodes Endocrine ROS: negative for - galactorrhea, hair pattern changes, polydipsia/polyuria or temperature intolerance Respiratory ROS: negative for - cough, hemoptysis, shortness of breath or wheezing Cardiovascular ROS: negative for - chest pain, dyspnea on exertion, edema or irregular heartbeat Gastrointestinal ROS: negative for - abdominal pain, diarrhea, hematemesis, nausea/vomiting or stool incontinence Genito-Urinary ROS: negative for - dysuria, hematuria, incontinence or urinary frequency/urgency Musculoskeletal ROS: negative for - joint swelling or muscular weakness  Neurological ROS: as noted in HPI Dermatological ROS: negative for rash and skin lesion changes  Physical Examination:                                                                                                      Blood pressure 177/82, pulse 61, temperature 99 F (37.2 C), temperature source Oral, resp. rate 12, height 5\' 5"  (1.651 m), weight 81.647 kg (180 lb), SpO2 98 %.  HEENT-  Normocephalic, no lesions, without obvious abnormality.  Normal external eye and conjunctiva.  Normal TM's bilaterally.  Normal auditory canals and external ears. Normal external nose, mucus membranes and septum.  Normal pharynx. Cardiovascular- irregularly irregular rhythm, pulses palpable throughout   Lungs- chest clear, no wheezing, rales, normal symmetric air  entry Abdomen- normal findings: bowel sounds normal Extremities- no edema Lymph-no adenopathy palpable Musculoskeletal-no joint tenderness, deformity or swelling Skin-dry  Neurological Examination Mental Status: Alert, oriented, thought content appropriate.  Speech fluent without evidence of aphasia.  Able to follow 3 step commands without difficulty. Cranial Nerves: II: Discs flat bilaterally; Visual fields grossly normal, pupils equal, round, reactive to light and accommodation III,IV, VI: ptosis not present, extra-ocular motions intact bilaterally V,VII: smile symmetric, facial light touch sensation normal bilaterally VIII: hearing normal bilaterally IX,X: uvula rises symmetrically XI: bilateral shoulder shrug XII: midline tongue extension Motor: Right : Upper extremity   5/5    Left:     Upper extremity   5/5  Lower extremity   5/5     Lower extremity   5/5 Tone and bulk:normal tone throughout; no atrophy noted Sensory: Pinprick and light touch intact throughout, bilaterally Deep Tendon Reflexes: 2+ and symmetric throughout Plantars: Right: downgoing   Left: downgoing Cerebellar: normal finger-to-nose, normal rapid alternating movements and normal heel-to-shin test Gait: not tested due to safety       Lab Results: Basic Metabolic Panel:  Recent Labs Lab 05/10/15 1052  NA 140  K 4.4  CL 107  CO2 26  GLUCOSE 98  BUN 11  CREATININE 0.86  CALCIUM 9.4    Liver Function Tests: No results for input(s): AST, ALT, ALKPHOS, BILITOT, PROT, ALBUMIN in the last 168 hours. No results for input(s): LIPASE, AMYLASE in the last 168 hours. No results for input(s): AMMONIA in the last 168 hours.  CBC:  Recent Labs Lab 05/10/15 1052  WBC 7.4  NEUTROABS 5.0  HGB 14.3  HCT 43.8  MCV 94.2  PLT 210    Cardiac Enzymes:  Recent Labs Lab 05/10/15 1052  TROPONINI <0.03    Lipid Panel: No results for input(s): CHOL, TRIG, HDL, CHOLHDL, VLDL, LDLCALC in the last 168  hours.  CBG: No results for input(s): GLUCAP in the last 168 hours.  Microbiology: Results for orders placed or performed during the hospital encounter of 02/28/13  Urine culture     Status: None   Collection Time: 02/28/13 11:36 AM  Result Value Ref Range Status   Specimen Description URINE, CLEAN CATCH  Final   Special Requests NONE  Final   Culture  Setup Time 02/28/2013 14:07  Final   Colony Count 25,000 COLONIES/ML  Final   Culture   Final    Multiple bacterial morphotypes present, none predominant. Suggest appropriate recollection if clinically indicated.   Report Status 03/01/2013 FINAL  Final    Coagulation Studies: No results for input(s): LABPROT, INR in the last 72 hours.  Imaging: Dg Chest 2 View  05/10/2015   CLINICAL DATA:  Dizziness and weakness for 5 days.  EXAM: CHEST  2 VIEW  COMPARISON:  08/24/2014  FINDINGS: Lungs are clear without airspace disease or pulmonary edema. Heart and mediastinum are within normal limits. No large pleural effusions. Degenerative changes in the thoracic spine.  IMPRESSION: No active cardiopulmonary disease.   Electronically Signed   By: Markus Daft M.D.   On: 05/10/2015 11:05   [name] Contrast  05/10/2015   CLINICAL DATA:  81 year old female with history of vertigo since 2014. Medication not helping. Dizziness on motion. History of hypertension, atrial fibrillation, endometrial carcinoma and hyperlipidemia. Initial encounter.  EXAM: MRI HEAD WITHOUT CONTRAST  TECHNIQUE: Multiplanar, multiecho pulse sequences of the brain and surrounding structures were obtained without intravenous contrast.  COMPARISON:  02/27/2013.  FINDINGS: Exam is motion degraded.  Tiny acute nonhemorrhagic posterior right temporal lobe infarct.  Remote left frontal lobe infarcts with wallerian degeneration and left cerebral peduncle mineral deposition unchanged.  Tiny blood breakdown products right middle cerebral peduncle and left frontal lobe consistent with prior  hemorrhagic ischemia without acute intracranial hemorrhage noted.  Small vessel disease type changes most notable periventricular region.  Mild atrophy without hydrocephalus.  No intracranial mass lesion noted on this unenhanced exam.  Major intracranial vascular structures are patent.  Mild transverse ligament hypertrophy. Facet joint degenerative changes left aspect upper cervical spine.  Cervical medullary junction and pineal region unremarkable.  Partially empty non expanded sella felt to be an incidental finding.  Post lens replacement. Slight prominence peri optic spaces, within normal limits.  Paranasal sinus mucosal thickening most notable left maxillary sinus.  IMPRESSION: Exam is motion degraded.  Tiny acute nonhemorrhagic posterior right temporal lobe infarct.  Remote left frontal lobe infarcts with wallerian degeneration unchanged.  Small vessel disease type changes most notable periventricular region.  Mild atrophy without hydrocephalus.  Paranasal sinus mucosal thickening most notable left maxillary sinus.   Electronically Signed   By: Genia Del M.D.   On: 05/10/2015 12:52       Assessment and plan discussed with with attending physician and they are in agreement.    Etta Quill PA-C [facility] [phone]  05/10/2015, 2:23 PM   Assessment: 81 y.o. female with acute tiny punctate infarct in right parietal lobe.  Likely incidental finding as this would not explain her symptoms and she is asymptomatic on exam.  Stroke Risk Factors - atrial fibrillation, hyperlipidemia and hypertension  Recommend: 1. HgbA1c, fasting lipid panel 2. MRI, MRA  of the brain without contrast 3. PT consult, OT consult, Speech consult 4. Echocardiogram 5. Carotid dopplers 6. Prophylactic therapy-Continue Xeralto 7. Risk factor modification 8. Telemetry monitoring 9. Frequent neuro checks 10 NPO until passes stroke swallow screen  Patient seen and examined together with physician  assistant and I concur with the assessment and plan.  Dorian Pod, MD

## 2015-05-10 NOTE — ED Provider Notes (Signed)
CSN: [id number]     Arrival date & time 05/10/15  8527 History   First MD Initiated Contact with Patient 05/10/15 [phone]     Chief Complaint  Patient presents with  . Dizziness     (Consider location/radiation/quality/duration/timing/severity/associated sxs/prior Treatment) HPI Comments: Pt is a 81 yo female with history of HTN, vertigo and paroxysmal afib who presents to the ED with complaint of dizziness, onset this morning. Pt reports she woke up this morning and was trying to get out of bed when she began to feel dizzy which she describes as "unsteadiness". Dizziness worsens with movement, no relief with meclizine. Pt reports a history of vertigo but states she usually feels like the room is spinning and usually has relief with meclizine. Endorses HA, reports history of headaches which this one is consistent with her usual headaches. She reports numbness to her left arm and leg that occurred last night while laying in bed that has since resolved. Denies fever, visual changes, sore throat, SOB, cough, CP, abdominal pain, N/V/D, urinary sxs, weakness.   Patient is a 81 y.o. female presenting with dizziness.  Dizziness Associated symptoms: headaches     Past Medical History  Diagnosis Date  . Hypertension   . Arthritis   . 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 Endometrial ca   . Anxiety   . HLD (hyperlipidemia)   . Cardiomegaly   . Pectus excavatum   . OA (osteoarthritis)   . Vertigo   . Hx of echocardiogram     a. Echo (11/15): Mild LVH, EF 55-60%, normal wall motion, grade 2 diastolic dysfunction, mild LAE, normal RV function, PASP 29 mm Hg   Past Surgical History  Procedure Laterality Date  . Cesarean section  1975  . Laparoscopic hysterectomy  2011  . Cataract extraction, bilateral Bilateral 2009  . Abdominal hysterectomy  2011   Family History  Problem  Relation Age of Onset  . Heart attack Father   . Heart disease Father    Social History  Substance Use Topics  . Smoking status: Never Smoker   . Smokeless tobacco: Never Used  . Alcohol Use: No   OB History    No data available     Review of Systems  Neurological: Positive for dizziness, numbness and headaches.  All other systems reviewed and are negative.     Allergies  Carbamazepine; Codeine; Hydrochlorothiazide; Penicillins; and Sulfonamide derivatives  Home Medications   Prior to Admission medications   Medication Sig Start Date End Date Taking? Authorizing Provider  acetaminophen (TYLENOL) 325 MG tablet Take 650 mg by mouth every 6 (six) hours as needed for headache.    Historical Provider, MD  cholecalciferol (VITAMIN D) 1000 UNITS tablet Take 1,000 Units by mouth daily.    Historical Provider, MD  DIOVAN 160 MG tablet Take 80 mg by mouth 2 (two) times daily. 02/21/15   Historical Provider, MD  felodipine (PLENDIL) 5 MG 24 hr tablet Take 5 mg by mouth daily. 1/2 tablet in the AM and 1/2 tablet in the PM.    Historical Provider, MD  FLUoxetine (PROZAC) 10 MG tablet Take 10 mg by mouth daily.     Historical Provider, MD  KLOR-CON M20 20 MEQ tablet Take 10 mEq by mouth daily.  09/24/13   Historical Provider, MD  labetalol (NORMODYNE) 200 MG tablet Take 200 mg by mouth in  the am, 200 mg by mouth in the pm, can take an extra 200 mg by mouth if BP will not come down.    Historical Provider, MD  meclizine (ANTIVERT) 25 MG tablet Take 25 mg by mouth 2 (two) times daily. Dizziness    Historical Provider, MD  metoprolol tartrate (LOPRESSOR) 25 MG tablet Take 1 tablet (25 mg total) by mouth once as needed. For tachypalpitations 11/09/14   Deboraha Sprang, MD  PROCTOSOL HC 2.5 % rectal cream Place 1 application rectally as needed. hemrrohids 04/28/14   Historical Provider, MD  XARELTO 20 MG TABS tablet TAKE ONE TABLET BY MOUTH ONCE DAILY 04/21/15   Deboraha Sprang, MD  zolpidem (AMBIEN) 5  MG tablet Take 5 mg by mouth at bedtime. For sleep    Historical Provider, MD   BP 154/72 mmHg  Pulse 72  Temp(Src) 99 F (37.2 C) (Oral)  Resp 19  Ht 5\' 5"  (1.651 m)  Wt 180 lb (81.647 kg)  BMI 29.95 kg/m2  SpO2 96% Physical Exam  Constitutional: She is oriented to person, place, and time. She appears well-developed and well-nourished.  HENT:  Head: Normocephalic and atraumatic.  Right Ear: Tympanic membrane and external ear normal.  Left Ear: Tympanic membrane and external ear normal.  Nose: Nose normal. Right sinus exhibits no maxillary sinus tenderness and no frontal sinus tenderness. Left sinus exhibits no maxillary sinus tenderness and no frontal sinus tenderness.  Mouth/Throat: Uvula is midline, oropharynx is clear and moist and mucous membranes are normal.  Eyes: Conjunctivae and EOM are normal. Pupils are equal, round, and reactive to light. Right eye exhibits no discharge. Left eye exhibits no discharge. No scleral icterus. Right eye exhibits no nystagmus. Left eye exhibits no nystagmus.  Neck: Normal range of motion. Neck supple.  Cardiovascular: Normal rate, regular rhythm, normal heart sounds and intact distal pulses.   Pulmonary/Chest: Effort normal and breath sounds normal. She has no wheezes. She has no rales. She exhibits no tenderness.  Abdominal: Soft. Bowel sounds are normal. She exhibits no mass. There is no tenderness. There is no rebound and no guarding.  Musculoskeletal: Normal range of motion. She exhibits no edema or tenderness.  Lymphadenopathy:    She has no cervical adenopathy.  Neurological: She is alert and oriented to person, place, and time. She has normal strength and normal reflexes. No cranial nerve deficit or sensory deficit. She displays a negative Romberg sign. Coordination and gait normal.  Pt able to stand and ambulate in room, no ataxia noted. Normal finger-nose-finger.  Skin: Skin is warm and dry.  Nursing note and vitals reviewed.   ED  Course  Procedures (including critical care time) Labs Review Labs Reviewed  URINE CULTURE  BASIC METABOLIC PANEL  CBC WITH DIFFERENTIAL/PLATELET  URINALYSIS, ROUTINE W REFLEX MICROSCOPIC (NOT AT [facility])  TROPONIN I  I-STAT TROPOININ, ED  I-STAT CG4 LACTIC ACID, ED    Imaging Review No results found. I have personally reviewed and evaluated these images and lab results as part of my medical decision-making.   EKG Interpretation   Date/Time:  Monday May 10 2015 09:51:18 EDT Ventricular Rate:  68 PR Interval:  164 QRS Duration: 96 QT Interval:  419 QTC Calculation: 446 R Axis:   -53 Text Interpretation:  Sinus rhythm Atrial premature complexes Left  anterior fascicular block Abnormal R-wave progression, early transition  Left ventricular hypertrophy Borderline T abnormalities, diffuse leads No  significant change since last tracing Confirmed by Mingo Amber  MD, Benjie Karvonen  (  [id number]) on 05/10/2015 9:56:23 AM     Filed Vitals:   05/10/15 1400  BP: 177/82  Pulse: 61  Temp:   Resp: 12   Meds given in ED:  Medications  ciprofloxacin (CIPRO) IVPB 400 mg (400 mg Intravenous New Bag/Given 05/10/15 1348)  meclizine (ANTIVERT) tablet 25 mg (25 mg Oral Given 05/10/15 1119)  diazepam (VALIUM) tablet 5 mg (5 mg Oral Given 05/10/15 1119)  diazepam (VALIUM) tablet 5 mg (0 mg Oral Duplicate 2/67/12 4580)  aspirin chewable tablet 324 mg (324 mg Oral Given 05/10/15 1348)    New Prescriptions   No medications on file     MDM   Final diagnoses:  Right temporal lobe infarction  UTI (lower urinary tract infection)  Dizziness    Pt presents with dizziness that worsens with movement, headache and numbness to left extremities that has since resolved. Pt with history of vertigo. Pt given valium and meclizine for sxs.  CBC, BMP, lactic acid unremarkable. EKG unchanged. Troponin negative. UA consistent with UTI. Pt started on Cipro in ED for UTI.CXR negative. MR brain revealed tiny acute  nonhemorrhagic posterior right temporal lobe infarct, small vessel disease type changes most notable periventricular region. Pt given ASA.   Consulted hospitalist and neuro hospitalist, neuro will come see pt and will admit to hospitalist service. Discussed plan for admission with pt.   Chesley Noon [place], [place] 05/10/15 1436  Evelina Bucy, MD 05/10/15 [phone]

## 2015-05-10 NOTE — ED Notes (Signed)
Pt dropped valium on the floor. Med wasted with [facility] RN and new med pulled for pt.

## 2015-05-10 NOTE — ED Notes (Signed)
Patient transported to MRI 

## 2015-05-10 NOTE — Progress Notes (Signed)
Pt arrived to 5C03 @ 1440 from ED, Pt A&Ox 4, c/o pain 0/10. Pt VS taken. Pt without distress. Family at the bedside. Will monitor.

## 2015-05-10 NOTE — ED Notes (Signed)
Pt still at MRI

## 2015-05-10 NOTE — ED Notes (Signed)
Pt arrives from home via [facility]. Pt states she was dx with vertigo in 2014 and has been taking antivert. Pt states her medicine is no longer helping and having dizziness any time she moves.

## 2015-05-10 NOTE — ED Notes (Signed)
Admitting at bedside 

## 2015-05-11 ENCOUNTER — Ambulatory Visit (HOSPITAL_COMMUNITY): Payer: Medicare Other

## 2015-05-11 ENCOUNTER — Inpatient Hospital Stay (HOSPITAL_COMMUNITY): Payer: Medicare Other

## 2015-05-11 DIAGNOSIS — I63311 Cerebral infarction due to thrombosis of right middle cerebral artery: Secondary | ICD-10-CM

## 2015-05-11 DIAGNOSIS — R42 Dizziness and giddiness: Secondary | ICD-10-CM

## 2015-05-11 DIAGNOSIS — I480 Paroxysmal atrial fibrillation: Secondary | ICD-10-CM

## 2015-05-11 DIAGNOSIS — I639 Cerebral infarction, unspecified: Principal | ICD-10-CM

## 2015-05-11 DIAGNOSIS — I10 Essential (primary) hypertension: Secondary | ICD-10-CM

## 2015-05-11 LAB — LIPID PANEL
CHOL/HDL RATIO: 7.8 ratio
Cholesterol: 196 mg/dL (ref 0–200)
HDL: 25 mg/dL — ABNORMAL LOW (ref >40)
LDL Cholesterol: 140 mg/dL — ABNORMAL HIGH (ref 0–99)
Triglycerides: 154 mg/dL — ABNORMAL HIGH (ref <150)
VLDL: 31 mg/dL (ref 0–40)

## 2015-05-11 LAB — URINE CULTURE: Culture: NO GROWTH

## 2015-05-11 MED ORDER — FLUOXETINE HCL 10 MG PO CAPS
10.0000 mg | ORAL_CAPSULE | Freq: Every day | ORAL | Status: DC
Start: 2015-05-11 — End: 2015-05-15
  Filled 2015-05-11 (×5): qty 1

## 2015-05-11 MED ORDER — LABETALOL HCL 200 MG PO TABS
200.0000 mg | ORAL_TABLET | Freq: Two times a day (BID) | ORAL | Status: DC
  Administered 2015-05-11 – 2015-05-15 (×10): 200 mg via ORAL
  Filled 2015-05-11 (×9): qty 1

## 2015-05-11 MED ORDER — IRBESARTAN 150 MG PO TABS
75.0000 mg | ORAL_TABLET | Freq: Every day | ORAL | Status: DC
  Administered 2015-05-12 – 2015-05-13 (×2): 75 mg via ORAL
  Filled 2015-05-11 (×2): qty 1

## 2015-05-11 MED ORDER — PRAVASTATIN SODIUM 40 MG PO TABS
40.0000 mg | ORAL_TABLET | Freq: Every day | ORAL | Status: DC
  Administered 2015-05-11 – 2015-05-14 (×3): 40 mg via ORAL
  Filled 2015-05-11 (×4): qty 1

## 2015-05-11 NOTE — Consult Note (Signed)
Referring Physician: Broadus John   Chief Complaint: Dizziness  HPI:   Tiffany Black is an 81 y.o. female with known PAF on Xeralto and chronic vertigo brought to ED after having 2 days of feeling dizzy and not stable on her feet. She denies vertigo but states she just felt as if she was having another bought of her usual BPPV. MRI was obtained while in ED and showed a single punctate tiny infarct in the right parietal lobe. Neurology was called due to this finding. She has no weakness, numbness, extinction.   Date last known well: Date: 05/09/2015 Time last known well: Unable to determine tPA Given: No: no symptoms    Past Medical History  Diagnosis Date  . Hypertension   . Arthritis   . 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 Endometrial ca   . Anxiety   . HLD (hyperlipidemia)   . Cardiomegaly   . Pectus excavatum   . OA (osteoarthritis)   . Vertigo   . Hx of echocardiogram     a. Echo (11/15): Mild LVH, EF 55-60%, normal wall motion, grade 2 diastolic dysfunction, mild LAE, normal RV function, PASP 29 mm Hg    Past Surgical History  Procedure Laterality Date  . Cesarean section  1975  . Laparoscopic hysterectomy  2011  . Cataract extraction, bilateral Bilateral 2009  . Abdominal hysterectomy  2011    Family History  Problem Relation Age of Onset  . Heart attack Father   . Heart disease Father    Social History:  reports that she has never smoked. She has never used smokeless tobacco. She reports that she does not drink alcohol or use illicit drugs. Family history: no MS, brain tumor, or brain aneurysm. Allergies:  Allergies  Allergen  Reactions  . Carbamazepine Other (See Comments)    unknown  . Codeine Nausea And Vomiting    Nausea and vomiting  . Hydrochlorothiazide Other (See Comments)    unknown  . Penicillins Hives    Hives   . Sulfonamide Derivatives Hives    Hives     Medications:    Current Facility-Administered Medications  Medication Dose Route Frequency Provider Last Rate Last Dose  . ciprofloxacin (CIPRO) IVPB 400 mg 400 mg Intravenous Once Evelina Bucy, MD 200 mL/hr at 05/10/15 1348 400 mg at 05/10/15 1348   Current Outpatient Prescriptions  Medication Sig Dispense Refill  . acetaminophen (TYLENOL) 325 MG tablet Take 650 mg by mouth every 6 (six) hours as needed for headache.    . cholecalciferol (VITAMIN D) 1000 UNITS tablet Take 1,000 Units by mouth daily.    Marland Kitchen DIOVAN 160 MG tablet Take 80 mg by mouth 2 (two) times daily.  3  . felodipine (PLENDIL) 5 MG 24 hr tablet Take 5 mg by mouth daily. 1/2 tablet in the AM and 1/2 tablet in the PM.    . FLUoxetine (PROZAC) 10 MG tablet Take 10 mg by mouth daily.     Marland Kitchen KLOR-CON M20 20 MEQ tablet Take 10 mEq by mouth daily.     Marland Kitchen labetalol (NORMODYNE) 200 MG tablet Take 200 mg by mouth in the am, 200 mg by mouth in the pm, can take an extra 200 mg by mouth if BP will not come down.    . meclizine (ANTIVERT) 25 MG tablet Take 25 mg by mouth 2 (two) times daily. Dizziness    .  metoprolol tartrate (LOPRESSOR) 25 MG tablet Take 1 tablet (25 mg total) by mouth once as needed. For tachypalpitations 10 tablet 0  . PROCTOSOL HC 2.5 % rectal cream Place 1 application rectally as needed. hemrrohids    . XARELTO 20 MG TABS tablet TAKE ONE TABLET BY MOUTH ONCE DAILY 90 tablet 0  . zolpidem (AMBIEN) 5 MG tablet Take 5 mg by mouth at bedtime. For  sleep    \   ROS:   History obtained from the patient  General ROS: negative for - chills, fatigue, fever, night sweats, weight gain or weight loss Psychological ROS: negative for - behavioral disorder, hallucinations, memory difficulties, mood swings or suicidal ideation Ophthalmic ROS: negative for - blurry vision, double vision, eye pain or loss of vision ENT ROS: negative for - epistaxis, nasal discharge, oral lesions, sore throat, tinnitus or vertigo Allergy and Immunology ROS: negative for - hives or itchy/watery eyes Hematological and Lymphatic ROS: negative for - bleeding problems, bruising or swollen lymph nodes Endocrine ROS: negative for - galactorrhea, hair pattern changes, polydipsia/polyuria or temperature intolerance Respiratory ROS: negative for - cough, hemoptysis, shortness of breath or wheezing Cardiovascular ROS: negative for - chest pain, dyspnea on exertion, edema or irregular heartbeat Gastrointestinal ROS: negative for - abdominal pain, diarrhea, hematemesis, nausea/vomiting or stool incontinence Genito-Urinary ROS: negative for - dysuria, hematuria, incontinence or urinary frequency/urgency Musculoskeletal ROS: negative for - joint swelling or muscular weakness Neurological ROS: as noted in HPI Dermatological ROS: negative for rash and skin lesion changes  Physical Examination:  Blood pressure 177/82, pulse 61, temperature 99 F (37.2 C), temperature source Oral, resp. rate 12, height 5\' 5"  (1.651 m), weight 81.647 kg (180 lb), SpO2 98 %.  HEENT- Normocephalic, no lesions, without obvious abnormality. Normal external eye and conjunctiva. Normal TM's bilaterally. Normal auditory canals and external ears. Normal external nose, mucus  membranes and septum. Normal pharynx. Cardiovascular- irregularly irregular rhythm, pulses palpable throughout  Lungs- chest clear, no wheezing, rales, normal symmetric air entry Abdomen- normal findings: bowel sounds normal Extremities- no edema Lymph-no adenopathy palpable Musculoskeletal-no joint tenderness, deformity or swelling Skin-dry  Neurological Examination Mental Status: Alert, oriented, thought content appropriate. Speech fluent without evidence of aphasia. Able to follow 3 step commands without difficulty. Cranial Nerves: II: Discs flat bilaterally; Visual fields grossly normal, pupils equal, round, reactive to light and accommodation III,IV, VI: ptosis not present, extra-ocular motions intact bilaterally V,VII: smile symmetric, facial light touch sensation normal bilaterally VIII: hearing normal bilaterally IX,X: uvula rises symmetrically XI: bilateral shoulder shrug XII: midline tongue extension Motor: Right :Upper extremity 5/5Left: Upper extremity 5/5 Lower extremity 5/5Lower extremity 5/5 Tone and bulk:normal tone throughout; no atrophy noted Sensory: Pinprick and light touch intact throughout, bilaterally Deep Tendon Reflexes: 2+ and symmetric throughout Plantars: Right: downgoingLeft: downgoing Cerebellar: normal finger-to-nose, normal rapid alternating movements and normal heel-to-shin test Gait: not tested due to safety      Lab Results: Basic Metabolic Panel:  Last Labs      Recent Labs Lab 05/10/15 1052  NA 140  K 4.4  CL 107  CO2 26  GLUCOSE 98  BUN 11  CREATININE 0.86  CALCIUM 9.4      Liver Function Tests:  Last Labs     No results for input(s): AST, ALT, ALKPHOS, BILITOT, PROT, ALBUMIN in the last 168 hours.    Last Labs     No results for input(s): LIPASE, AMYLASE  in the last 168 hours.    Last Labs  No results for input(s): AMMONIA in the last 168 hours.    CBC:  Last Labs      Recent Labs Lab 05/10/15 1052  WBC 7.4  NEUTROABS 5.0  HGB 14.3  HCT 43.8  MCV 94.2  PLT 210      Cardiac Enzymes:  Last Labs      Recent Labs Lab 05/10/15 1052  TROPONINI <0.03      Lipid Panel:  Last Labs     No results for input(s): CHOL, TRIG, HDL, CHOLHDL, VLDL, LDLCALC in the last 168 hours.    CBG:  Last Labs     No results for input(s): GLUCAP in the last 168 hours.    Microbiology: Results for orders placed or performed during the hospital encounter of 02/28/13  Urine culture Status: None   Collection Time: 02/28/13 11:36 AM  Result Value Ref Range Status   Specimen Description URINE, CLEAN CATCH  Final   Special Requests NONE  Final   Culture Setup Time 02/28/2013 14:07  Final   Colony Count 25,000 COLONIES/ML  Final   Culture   Final    Multiple bacterial morphotypes present, none predominant. Suggest appropriate recollection if clinically indicated.   Report Status 03/01/2013 FINAL  Final    Coagulation Studies:  Recent Labs (last 2 labs)     No results for input(s): LABPROT, INR in the last 72 hours.    Imaging:  Imaging Results (Last 48 hours)    Dg Chest 2 View  05/10/2015 CLINICAL DATA: Dizziness and weakness for 5 days. EXAM: CHEST 2 VIEW COMPARISON: 08/24/2014 FINDINGS: Lungs are clear without airspace disease or pulmonary edema. Heart and mediastinum are within normal limits. No large pleural effusions. Degenerative changes in the thoracic spine. IMPRESSION: No active cardiopulmonary disease. Electronically Signed By: Markus Daft M.D. On: 05/10/2015 11:05   [name] Contrast  05/10/2015 CLINICAL DATA: 81 year old female with history of vertigo since 2014. Medication not helping. Dizziness on motion. History of hypertension, atrial  fibrillation, endometrial carcinoma and hyperlipidemia. Initial encounter. EXAM: MRI HEAD WITHOUT CONTRAST TECHNIQUE: Multiplanar, multiecho pulse sequences of the brain and surrounding structures were obtained without intravenous contrast. COMPARISON: 02/27/2013. FINDINGS: Exam is motion degraded. Tiny acute nonhemorrhagic posterior right temporal lobe infarct. Remote left frontal lobe infarcts with wallerian degeneration and left cerebral peduncle mineral deposition unchanged. Tiny blood breakdown products right middle cerebral peduncle and left frontal lobe consistent with prior hemorrhagic ischemia without acute intracranial hemorrhage noted. Small vessel disease type changes most notable periventricular region. Mild atrophy without hydrocephalus. No intracranial mass lesion noted on this unenhanced exam. Major intracranial vascular structures are patent. Mild transverse ligament hypertrophy. Facet joint degenerative changes left aspect upper cervical spine. Cervical medullary junction and pineal region unremarkable. Partially empty non expanded sella felt to be an incidental finding. Post lens replacement. Slight prominence peri optic spaces, within normal limits. Paranasal sinus mucosal thickening most notable left maxillary sinus. IMPRESSION: Exam is motion degraded. Tiny acute nonhemorrhagic posterior right temporal lobe infarct. Remote left frontal lobe infarcts with wallerian degeneration unchanged. Small vessel disease type changes most notable periventricular region. Mild atrophy without hydrocephalus. Paranasal sinus mucosal thickening most notable left maxillary sinus. Electronically Signed By: Genia Del M.D. On: 05/10/2015 12:52        Assessment and plan discussed with with attending physician and they are in agreement.   Etta Quill PA-C [facility] [phone]  05/10/2015, 2:23 PM  Assessment: 81 y.o. female with acute tiny punctate  infarct in right  parietal lobe. Likely incidental finding as this would not explain her symptoms and she is asymptomatic on exam.  Stroke Risk Factors - atrial fibrillation, hyperlipidemia and hypertension  Recommend: 1. HgbA1c, fasting lipid panel 2. MRI, MRA of the brain without contrast 3. PT consult, OT consult, Speech consult 4. Echocardiogram 5. Carotid dopplers 6. Prophylactic therapy-Continue Xeralto 7. Risk factor modification 8. Telemetry monitoring 9. Frequent neuro checks 10 NPO until passes stroke swallow screen  Patient seen and examined together with physician assistant and I concur with the assessment and plan.  Dorian Pod, MD

## 2015-05-11 NOTE — Evaluation (Signed)
Speech Language Pathology Evaluation Patient Details Name: Tiffany Black MRN: [id number] DOB: [birth year] Today's Date: 05/11/2015 Time: 2774-1287 SLP Time Calculation (min) (ACUTE ONLY): 23 min  Problem List:  Patient Active Problem List   Diagnosis Date Noted  . Stroke 05/10/2015  . Dizziness 05/10/2015  . Orthostatic hypotension 05/10/2015  . CVA (cerebral infarction) 05/10/2015  . Hypokalemia 09/30/2013  . Vertigo 02/28/2013  . Sinusitis 02/28/2013  . Anxiety 02/28/2013  . PAC (premature atrial contraction) 04/10/2012  . Abnormal EKG 01/19/2012  . Essential hypertension, benign 02/16/2010  . Atrial fibrillation 02/15/2010   Past Medical History:  Past Medical History  Diagnosis Date  . Hypertension   . Arthritis   . 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 Endometrial ca   . Anxiety   . HLD (hyperlipidemia)   . Cardiomegaly   . Pectus excavatum   . OA (osteoarthritis)   . Vertigo   . Hx of echocardiogram     a. Echo (11/15): Mild LVH, EF 55-60%, normal wall motion, grade 2 diastolic dysfunction, mild LAE, normal RV function, PASP 29 mm Hg   Past Surgical History:  Past Surgical History  Procedure Laterality Date  . Cesarean section  1975  . Laparoscopic hysterectomy  2011  . Cataract extraction, bilateral Bilateral 2009  . Abdominal hysterectomy  2011   HPI:  81 y.o. female with history of known PAF on Xeralto and chronic vertigo presenting with dizziness, work up revealed R temporal lobe infarct.   Assessment / Plan / Recommendation Clinical Impression  Pt's cognitive-linguistic function is within gross functional limits. Very mild deficits noted with retrieval of new information, although pt describes this as her baseline level of function. No further SLP needs identified, will sign off.    SLP Assessment  Patient does not need any further  Speech Lanaguage Pathology Services    Follow Up Recommendations  None    Frequency and Duration  n/a      Pertinent Vitals/Pain Pain Assessment: No/denies pain   SLP Goals  Patient/Family Stated Goal: wants to get her blood pressure medications sorted out, encouraged her to discuss with MD  SLP Evaluation Prior Functioning  Cognitive/Linguistic Baseline: Baseline deficits Baseline deficit details: pt describes mild difficulty with memory at baseline Type of Home: House   Cognition  Overall Cognitive Status: No family/caregiver present to determine baseline cognitive functioning Orientation Level: Oriented X4    Comprehension  Auditory Comprehension Overall Auditory Comprehension: Appears within functional limits for tasks assessed Visual Recognition/Discrimination Discrimination: Within Function Limits    Expression Expression Primary Mode of Expression: Verbal Verbal Expression Overall Verbal Expression: Appears within functional limits for tasks assessed Written Expression Dominant Hand: Right   Oral / Motor Oral Motor/Sensory Function Overall Oral Motor/Sensory Function: Appears within functional limits for tasks assessed Motor Speech Overall Motor Speech: Appears within functional limits for tasks assessed      Germain Osgood, M.A. CCC-SLP [phone]  Germain Osgood 05/11/2015, 2:53 PM

## 2015-05-11 NOTE — Evaluation (Signed)
Occupational Therapy Evaluation and Discharge Patient Details Name: Tiffany Black MRN: [id number] DOB: 1934/04/17 Today's Date: 05/11/2015    History of Present Illness 81 y.o. female with history of known PAF on Xeralto and chronic vertigo presenting with dizziness, work up revealed R temporal lobe infarct.   Clinical Impression   This 81 yo female admitted with above presents to acute OT at an independent level for BADLs. She reports that she is does not like to be alone since her husband passed and when her son is at work. Would probably be good for her to get involved in some sort of support group. No further OT needs, we will sign off.    Follow Up Recommendations  No OT follow up    Equipment Recommendations  None recommended by OT       Precautions / Restrictions Precautions Precautions: None      Mobility Bed Mobility Overal bed mobility: Independent              Transfers Overall transfer level: Independent Equipment used: None                Balance Overall balance assessment: No apparent balance deficits (not formally assessed)                                   ADL Overall ADL's : Independent                                       General ADL Comments: Pt able to bend down and pick items off of the floor without issues and get items she need out of closet to wash up and get dressed.     Vision Additional Comments: No change from baseline          Pertinent Vitals/Pain Pain Assessment: No/denies pain     Hand Dominance Right   Extremity/Trunk Assessment Upper Extremity Assessment Upper Extremity Assessment: Overall WFL for tasks assessed     Communication Communication Communication: No difficulties   Cognition Arousal/Alertness: Awake/alert Behavior During Therapy: Anxious;Flat affect Overall Cognitive Status: No family/caregiver present to determine baseline cognitive functioning                  General Comments: Agree with PT: Patient very anxious and distracted by various aspects of her medical care unrelated to this therapy session.               Home Living Family/patient expects to be discharged to:: Private residence Living Arrangements: Alone Available Help at Discharge: Family (son works during the day) Type of Home: House Home Access: Level entry     [place]: One level     Bathroom Shower/Tub: Tub/shower unit;Curtain Shower/tub characteristics: Architectural technologist: [place]ie[place]: Tub bench;Hand held shower head          Prior Functioning/Environment Level of Independence: Animal nutritionist / Transfers Assistance Needed: ambulates with cane on occasion for long distance          OT Diagnosis: Generalized weakness         OT Goals(Current goals can be found in the care plan section) Acute Rehab OT Goals Patient Stated Goal: to not go home and be alone  OT Frequency:  End of Session Equipment Utilized During Treatment:  (none) Nurse Communication:  (Pt worried about her IV)  Activity Tolerance: Patient tolerated treatment well Patient left: in bed;with call bell/phone within reach;with bed alarm set   Time: 1511-1537 OT Time Calculation (min): 26 min Charges:  OT General Charges $OT Visit: 1 Procedure OT Evaluation $Initial OT Evaluation Tier I: 1 Procedure OT Treatments $Self Care/Home Management : 8-22 mins  Almon Register [phone] 05/11/2015, 4:16 PM

## 2015-05-11 NOTE — Progress Notes (Signed)
*  PRELIMINARY RESULTS* Vascular Ultrasound Carotid Duplex (Doppler) has been completed.  Preliminary findings: Bilateral:  1-39% ICA stenosis.  Vertebral artery flow is antegrade.     Landry Mellow, RDMS, RVT  05/11/2015, 4:49 PM

## 2015-05-11 NOTE — Care Management Note (Signed)
Case Management Note  Patient Details  Name: Tiffany Black MRN: [id number] Date of Birth: [birth year]  Subjective/Objective:                    Action/Plan: Patient was admitted with dizziness, CVA. Lives at home.  Current recommendation is for HHPT. CM will continue to follow for discharge needs and final disposition.  Expected Discharge Date:                  Expected Discharge Plan:  [place]  In-House Referral:     Discharge planning Services     Post Acute Care Choice:    Choice offered to:     DME Arranged:    DME Agency:     HH Arranged:    [place] Agency:     Status of Service:  In process, will continue to follow  Medicare Important Message Given:    Date Medicare IM Given:    Medicare IM give by:    Date Additional Medicare IM Given:    Additional Medicare Important Message give by:     If discussed at [place] of Stay Meetings, dates discussed:    Additional Comments:  Rolm Baptise, RN 05/11/2015, 2:51 PM

## 2015-05-11 NOTE — Progress Notes (Signed)
PATIENT DETAILS Name: Tiffany Black Age: 81 y.o. Sex: female Date of Birth: [birth year] Admit Date: 05/10/2015 Admitting Physician Domenic Polite, MD ION:[id number] S, MD  Subjective: No dizziness on standing.   Assessment/Plan: Principal Problem: Orthostatic hypotension: Improved, stop IV fluids. Continue labetalol. Continue to hold Diovan, and felodipine. Follow BP trend.  Active Problems: Acute CVA: Likely an incidental finding. Await echo and carotids. LDL 140 (goal less than 70)-continue statin, A1c pending. Continue his Xarelto. No focalities on exam  Essential hypertension, benign: Allow permissive hypertension in the setting of orthostatic hypotension and acute CVA. Continue labetalol. Follow BP trend  Paroxysmal Atrial fibrillation: Sinus rhythm-continue Xarelto.CHA2DS2-VASc Score 6  Dyslipidemia:LDL 140 (goal less than 70)-continue statin. Repeat LDL in 3 months  Anxiety/Depression:continue prozac  Asymptomatic bacteriuria: No indication for antibiotics.  Disposition: Remain inpatient  Antimicrobial agents  See below  Anti-infectives    Start     Dose/Rate Route Frequency Ordered Stop   05/10/15 1315  ciprofloxacin (CIPRO) IVPB 400 mg     400 mg 200 mL/hr over 60 Minutes Intravenous  Once 05/10/15 1306 05/10/15 1448      DVT Prophylaxis: Xarelto  Code Status: Full code   Family Communication None at bedside  Procedures: None  CONSULTS:  neurology  Time spent 40 minutes-Greater than 50% of this time was spent in counseling, explanation of diagnosis, planning of further management, and coordination of care.  MEDICATIONS: Scheduled Meds: . FLUoxetine  10 mg Oral Daily  . labetalol  200 mg Oral BID  . pravastatin  40 mg Oral q1800  . rivaroxaban  20 mg Oral Q supper   Continuous Infusions:  PRN Meds:.acetaminophen, meclizine, senna-docusate, zolpidem    PHYSICAL EXAM: Vital signs in last 24 hours: Filed Vitals:   05/11/15 0300 05/11/15 0714 05/11/15 0935 05/11/15 1343  BP: 145/69 150/70 159/83 169/78  Pulse: 65 65 69 64  Temp: 98.2 F (36.8 C) 99 F (37.2 C) 98.8 F (37.1 C) 98 F (36.7 C)  TempSrc: Oral Oral Oral Oral  Resp: 20 20 20 20   Height:      Weight:      SpO2: 96% 98% 98%     Weight change:  Filed Weights   05/10/15 1006  Weight: 81.647 kg (180 lb)   Body mass index is 29.95 kg/(m^2).   Gen Exam: Awake and alert with clear speech.   Neck: Supple, No JVD.   Chest: B/L Clear.   CVS: S1 S2 Regular, no murmurs.  Abdomen: soft, BS +, non tender, non distended. Extremities: no edema, lower extremities warm to touch. Neurologic: Non Focal.   Skin: No Rash.   Wounds: N/A.    Intake/Output from previous day:  Intake/Output Summary (Last 24 hours) at 05/11/15 1403 Last data filed at 05/11/15 0900  Gross per 24 hour  Intake    240 ml  Output      0 ml  Net    240 ml     LAB RESULTS: CBC  Recent Labs Lab 05/10/15 1052  WBC 7.4  HGB 14.3  HCT 43.8  PLT 210  MCV 94.2  MCH 30.8  MCHC 32.6  RDW 13.3  LYMPHSABS 1.6  MONOABS 0.6  EOSABS 0.2  BASOSABS 0.0    Chemistries   Recent Labs Lab 05/10/15 1052  NA 140  K 4.4  CL 107  CO2 26  GLUCOSE 98  BUN 11  CREATININE 0.86  CALCIUM  9.4    CBG: No results for input(s): GLUCAP in the last 168 hours.  GFR Estimated Creatinine Clearance: 55.9 mL/min (by C-G formula based on Cr of 0.86).  Coagulation profile No results for input(s): INR, PROTIME in the last 168 hours.  Cardiac Enzymes  Recent Labs Lab 05/10/15 1052  TROPONINI <0.03    Invalid input(s): POCBNP No results for input(s): DDIMER in the last 72 hours. No results for input(s): HGBA1C in the last 72 hours.  Recent Labs  05/11/15 0531  CHOL 196  HDL 25*  LDLCALC 140*  TRIG 154*  CHOLHDL 7.8   No results for input(s): TSH, T4TOTAL, T3FREE, THYROIDAB in the last 72 hours.  Invalid input(s): FREET3 No results for input(s):  VITAMINB12, FOLATE, FERRITIN, TIBC, IRON, RETICCTPCT in the last 72 hours. No results for input(s): LIPASE, AMYLASE in the last 72 hours.  Urine Studies No results for input(s): UHGB, CRYS in the last 72 hours.  Invalid input(s): UACOL, UAPR, USPG, UPH, UTP, UGL, UKET, UBIL, UNIT, UROB, ULEU, UEPI, UWBC, URBC, UBAC, CAST, UCOM, BILUA  MICROBIOLOGY: Recent Results (from the past 240 hour(s))  Urine culture     Status: None   Collection Time: 05/10/15 11:31 AM  Result Value Ref Range Status   Specimen Description URINE, CLEAN CATCH  Final   Special Requests NONE  Final   Culture NO GROWTH 1 DAY  Final   Report Status 05/11/2015 FINAL  Final    RADIOLOGY STUDIES/RESULTS: Dg Chest 2 View  05/10/2015   CLINICAL DATA:  Dizziness and weakness for 5 days.  EXAM: CHEST  2 VIEW  COMPARISON:  08/24/2014  FINDINGS: Lungs are clear without airspace disease or pulmonary edema. Heart and mediastinum are within normal limits. No large pleural effusions. Degenerative changes in the thoracic spine.  IMPRESSION: No active cardiopulmonary disease.   Electronically Signed   By: Markus Daft M.D.   On: 05/10/2015 11:05   [name] Contrast  05/10/2015   CLINICAL DATA:  81 year old female with history of vertigo since 2014. Medication not helping. Dizziness on motion. History of hypertension, atrial fibrillation, endometrial carcinoma and hyperlipidemia. Initial encounter.  EXAM: MRI HEAD WITHOUT CONTRAST  TECHNIQUE: Multiplanar, multiecho pulse sequences of the brain and surrounding structures were obtained without intravenous contrast.  COMPARISON:  02/27/2013.  FINDINGS: Exam is motion degraded.  Tiny acute nonhemorrhagic posterior right temporal lobe infarct.  Remote left frontal lobe infarcts with wallerian degeneration and left cerebral peduncle mineral deposition unchanged.  Tiny blood breakdown products right middle cerebral peduncle and left frontal lobe consistent with prior hemorrhagic ischemia without  acute intracranial hemorrhage noted.  Small vessel disease type changes most notable periventricular region.  Mild atrophy without hydrocephalus.  No intracranial mass lesion noted on this unenhanced exam.  Major intracranial vascular structures are patent.  Mild transverse ligament hypertrophy. Facet joint degenerative changes left aspect upper cervical spine.  Cervical medullary junction and pineal region unremarkable.  Partially empty non expanded sella felt to be an incidental finding.  Post lens replacement. Slight prominence peri optic spaces, within normal limits.  Paranasal sinus mucosal thickening most notable left maxillary sinus.  IMPRESSION: Exam is motion degraded.  Tiny acute nonhemorrhagic posterior right temporal lobe infarct.  Remote left frontal lobe infarcts with wallerian degeneration unchanged.  Small vessel disease type changes most notable periventricular region.  Mild atrophy without hydrocephalus.  Paranasal sinus mucosal thickening most notable left maxillary sinus.   Electronically Signed   By: Alcide Evener.D.  On: 05/10/2015 12:52   Mr Jodene Nam Head/brain Wo Cm  05/11/2015   CLINICAL DATA:  Intermittent dizziness, fluctuating blood pressure. History of hypertension and vertigo, atrial fibrillation, dyslipidemia.  EXAM: MRA HEAD WITHOUT CONTRAST  TECHNIQUE: Angiographic images of the Circle of Willis were obtained using MRA technique without intravenous contrast.  COMPARISON:  MRI of the brain May 10, 2015  FINDINGS: Anterior circulation: Normal flow related enhancement of the included cervical, petrous, cavernous and supra clinoid internal carotid arteries. Patent anterior communicating artery. Normal flow related enhancement of the anterior and middle cerebral arteries, including more distal segments. Focal signal loss within the distal LEFT middle cerebral artery segments corresponds to tortuosity on raw data.  No large vessel occlusion, high-grade stenosis, aneurysm.  Posterior  circulation: RIGHT vertebral artery is dominant. Basilar artery is patent, with normal flow related enhancement of the main branch vessels. RIGHT posterior cerebral artery occlusion at distal RIGHT P1 segment. Moderate luminal irregularity LEFT posterior cerebral artery.  No high-grade stenosis, aneurysm.  IMPRESSION: Occluded RIGHT posterior cerebral artery at the level of P1, possibly acute.  Moderate luminal irregularity of LEFT posterior cerebral artery compatible with atherosclerosis.  Focal high-grade stenosis distal LEFT MCA segment, which may be artifact.   Electronically Signed   By: Elon Alas M.D.   On: 05/11/2015 03:17    Oren Binet, MD  [facility] Pager:[phone]  If 7PM-7AM, please contact night-coverage [email or web address] Password [email or web address] 05/11/2015, 2:03 PM   LOS: 1 day

## 2015-05-11 NOTE — Progress Notes (Signed)
STROKE TEAM PROGRESS NOTE   SUBJECTIVE (INTERVAL HISTORY) No family is at the bedside. Patient is upset about not getting her BP med this am. She has been following with Dr. Hartford Poli in [facility] for BP management and also follows with Dr. Caryl Comes, cardiology in [facility] for PAF and cardiomegaly. She stated that due to her BP not in good control since admission, she felt dizzy and difficulty with bladder emptying.    OBJECTIVE Temp:  [98.1 F (36.7 C)-99 F (37.2 C)] 98.8 F (37.1 C) (08/30 0935) Pulse Rate:  [58-71] 69 (08/30 0935) Cardiac Rhythm:  [-] Normal sinus rhythm (08/30 0700) Resp:  [12-20] 20 (08/30 0935) BP: (145-177)/(69-83) 159/83 mmHg (08/30 0935) SpO2:  [96 %-99 %] 98 % (08/30 0935)  CBC:  Recent Labs Lab 05/10/15 1052  WBC 7.4  NEUTROABS 5.0  HGB 14.3  HCT 43.8  MCV 94.2  PLT 170   Basic Metabolic Panel:  Recent Labs Lab 05/10/15 1052  NA 140  K 4.4  CL 107  CO2 26  GLUCOSE 98  BUN 11  CREATININE 0.86  CALCIUM 9.4   Lipid Panel:    Component Value Date/Time   CHOL 196 05/11/2015 0531   TRIG 154* 05/11/2015 0531   HDL 25* 05/11/2015 0531   CHOLHDL 7.8 05/11/2015 0531   VLDL 31 05/11/2015 0531   LDLCALC 140* 05/11/2015 0531   HgbA1c: No results found for: HGBA1C Urine Drug Screen: No results found for: LABOPIA, COCAINSCRNUR, LABBENZ, AMPHETMU, THCU, LABBARB    IMAGING  Dg Chest 2 View 05/10/2015   No active cardiopulmonary disease.     [name] Contrast 05/10/2015    Exam is motion degraded.  Tiny acute nonhemorrhagic posterior right temporal lobe infarct.  Remote left frontal lobe infarcts with wallerian degeneration unchanged.  Small vessel disease type changes most notable periventricular region.  Mild atrophy without hydrocephalus.  Paranasal sinus mucosal thickening most notable left maxillary sinus.     Mr Jodene Nam Head/brain Wo Cm 05/11/2015    Occluded RIGHT posterior cerebral artery at the level of P1, possibly acute.  Moderate  luminal irregularity of LEFT posterior cerebral artery compatible with atherosclerosis.  Focal high-grade stenosis distal LEFT MCA segment, which may be artifact.     CUS - Bilateral: 1-39% ICA stenosis. Vertebral artery flow is antegrade.  2d echo pending   PHYSICAL EXAM  Temp:  [98 F (36.7 C)-99 F (37.2 C)] 98.2 F (36.8 C) (08/30 2044) Pulse Rate:  [62-69] 62 (08/30 2044) Resp:  [18-20] 20 (08/30 2044) BP: (145-183)/(69-87) 167/73 mmHg (08/30 2044) SpO2:  [96 %-100 %] 100 % (08/30 1723)  General - Well nourished, well developed, in no apparent distress.  Ophthalmologic - Sharp disc margins OU.   Cardiovascular - Regular rate and rhythm, not in afib rhythm.  Mental Status -  Level of arousal and orientation to time, place, and person were intact. Language including expression, naming, repetition, comprehension was assessed and found intact. Fund of Knowledge was assessed and was intact.  Cranial Nerves II - XII - II - Visual field intact OU. III, IV, VI - Extraocular movements intact. V - Facial sensation intact bilaterally. VII - Facial movement intact bilaterally. VIII - Hearing & vestibular intact bilaterally. X - Palate elevates symmetrically. XI - Chin turning & shoulder shrug intact bilaterally. XII - Tongue protrusion intact.  Motor Strength - The patient's strength was normal in all extremities and pronator drift was absent.  Bulk was normal and fasciculations were absent.  Motor Tone - Muscle tone was assessed at the neck and appendages and was normal.  Reflexes - The patient's reflexes were 1+ in all extremities and she had no pathological reflexes.  Sensory - Light touch, temperature/pinprick were assessed and were symmetrical.    Coordination - The patient had normal movements in the hands and feet with no ataxia or dysmetria.  Tremor was absent.  Gait and Station - deferred due to safety concerns.   ASSESSMENT/PLAN Ms. Manuelita Moxon is a 81 y.o.  female with history of known PAF on Xeralto, HTN, HLD, cardiomagely and chronic vertigo presenting with dizziness. She did not receive IV t-PA due to no stroke symptoms.   Stroke: Tiny R temporal lobe infarct could be secondary to small vessel disease. Incidental finding, can not explain pt presenting symptoms.  MRI  Tiny R temporal lobe infarct  MRA  Occluded R PCA at P1, L PCA and L MCA atherosclerosis  Carotid Doppler  unremarkable   2D Echo  pending   LDL 140  HgbA1c pending  xarelto for VTE prophylaxis  Diet Heart Room service appropriate?: Yes; Fluid consistency:: Thin  xarelto ( rivaroxaban) prior to admission, now on xarelto ( rivaroxaban). Denies missed doses. Continue Xarelto at discharge for stroke prevention. Recommend to consider add baby ASA to Xarelto for stroke prevention once her BP within the goal. Currently high BP not within the goal, adding ASA to Xarelto has higher risk for bleeding.  Patient counseled to be compliant with her antithrombotic medications  Ongoing aggressive stroke risk factor management  Therapy recommendations:  pending   Disposition:  pending   Atrial Fibrillation  Home anticoagulation:  xarelto continued in the hospital  CHA2DS2-VASc Score = 6, ?2 oral anticoagulation recommended  Age in Years:  ?23   +2    Sex:  Female   Female   +1    Hypertension History:  yes   +1     Diabetes Mellitus:  0   Congestive Heart Failure History:  0  Vascular Disease History:  0     Stroke/TIA/Thromboembolism History:  yes   +2  Continue xarelto at discharge  Essential Hypertension  Treated by Dr. Hartford Poli in [facility]  BP stable  Pt back on home meds including diovan and labetalol  BP goal now gradually normalized in 5-7 days.  Hyperlipidemia  Home meds:  No statin  LDL 140, goal < 70  Add statin pravastatin 40 mg daily. Dr. Erlinda Hong discussed with her  Continue statin at discharge  Other Stroke Risk Factors  Advanced age  Other  Active Problems  BPPV at baseline  Asymptomatic pyuria  Anxiety/depression on prozac  Hospital day # 1  Neurology will sign off. Please call with questions. Pt will follow up with Dr. Erlinda Hong at [facility] in about 2 months. Thanks for the consult.  Rosalin Hawking, MD PhD Stroke Neurology 05/11/2015 9:57 PM    To contact Stroke Continuity provider, please refer to [email or web address] After hours, contact General Neurology

## 2015-05-11 NOTE — Evaluation (Signed)
Physical Therapy Evaluation Patient Details Name: Tiffany Black MRN: [id number] DOB: 06-27-1934 Today's Date: 05/11/2015   History of Present Illness  81 y.o. female with history of known PAF on Xeralto and chronic vertigo presenting with dizziness, work up revealed R temporal lobe infarct.  Clinical Impression  Patient demonstrates deficits in functional mobility as indicated below. Will need continued skilled PT to address deficits and maximize function. Will see as indicated and progress as tolerated.  Overall patient mobilizing well but is severely self limiting and anxious throughout session. Attempted to perform DGI but with each instruction given patient provided various reasons for which she felt she could not carry out task (unrelated to functional level). When ambulating in hall, patient stated that she could not walk (despite ambulating >200 ft without any need for physical assist). Patient stating at both the start and conclusion of session that she had therapy for 21 days before and that she needed that now. Attempted to speak with patient regarding her mobility concerns and reassure her that she had just mobilized without any physical assist, but patient still felt addiment that she could not walk or go home and "walk into the kitchen to make herself a sandwich".  I recommended HHPT follow up for assessment of safety upon discharge home to address concerns but patient decline and did not want HHPT. During session patient also made mention of anxiety since husbands passing. Patient may benefit from chaplain services or psychology consult as this may be impacting her sense of ability to return home. Will follow.      Follow Up Recommendations Home health PT (recommend HHPT, patient refusing)    Equipment Recommendations  None recommended by PT    Recommendations for Other Services       Precautions / Restrictions Precautions Precautions: Fall      Mobility  Bed Mobility Overal  bed mobility: Independent             General bed mobility comments: no physical assist required  Transfers Overall transfer level: Modified independent Equipment used: None             General transfer comment: no physical assist required, pushed off bed rail using LUE without difficulty  Ambulation/Gait Ambulation/Gait assistance: Supervision Ambulation Distance (Feet): 210 Feet Assistive device: None Gait Pattern/deviations: Step-through pattern;Narrow base of support;Drifts right/left Gait velocity: decreased Gait velocity interpretation: Below normal speed for age/gender General Gait Details: patient very anxious with all aspects of mobility, however, no significant deficits noted. Patient encouraged to increased cadence for normalized gait but this just futhered her anxiety. Patient did not require any means of physical assist  throughout entirety of session  Stairs            Wheelchair Mobility    Modified Rankin (Stroke Patients Only) Modified Rankin (Stroke Patients Only) Pre-Morbid Rankin Score: No symptoms Modified Rankin: Moderate disability     Balance Overall balance assessment: No apparent balance deficits (not formally assessed)                               Standardized Balance Assessment Standardized Balance Assessment : Dynamic Gait Index (poor participation noted throughout assessment)   Dynamic Gait Index Level Surface: Normal Change in Gait Speed:  (pt stating she didnt feel safe changing speed) Gait with Horizontal Head Turns: Mild Impairment Gait with Vertical Head Turns: Mild Impairment Gait and Pivot Turn: Mild Impairment  Pertinent Vitals/Pain Pain Assessment: No/denies pain    Home Living Family/patient expects to be discharged to:: Private residence Living Arrangements: Alone   Type of Home: House Home Access: Level entry     Home Layout: One level Home Equipment: Cane - single point      Prior  Function Level of Independence: Independent   Gait / Transfers Assistance Needed: ambulates with cane on occasion for long distance           Hand Dominance   Dominant Hand: Right    Extremity/Trunk Assessment   Upper Extremity Assessment: Defer to OT evaluation           Lower Extremity Assessment: Overall WFL for tasks assessed (slight weakness in LLE compared to right but overall 4+/5)         Communication   Communication: No difficulties  Cognition Arousal/Alertness: Awake/alert Behavior During Therapy: Anxious;Flat affect Overall Cognitive Status: No family/caregiver present to determine baseline cognitive functioning                 General Comments: Patient very anxious and distracted by various aspects of her medical care unrelated to this therapy session.     General Comments      Exercises        Assessment/Plan    PT Assessment Patient needs continued PT services  PT Diagnosis Abnormality of gait   PT Problem List Decreased mobility;Decreased activity tolerance  PT Treatment Interventions DME instruction;Gait training;Functional mobility training;Therapeutic activities;Therapeutic exercise;Balance training;Cognitive remediation;Patient/family education   PT Goals (Current goals can be found in the Care Plan section) Acute Rehab PT Goals PT Goal Formulation: With patient Time For Goal Achievement: 05/25/15 Potential to Achieve Goals: Good    Frequency Min 3X/week   Barriers to discharge  (patient with increased fear and anxiety)      Co-evaluation               End of Session Equipment Utilized During Treatment: Gait belt Activity Tolerance: Patient tolerated treatment well Patient left: in bed;with call bell/phone within reach;with bed alarm set Nurse Communication: Mobility status (patient with elevated anxiety)         Time: 1031-5945 PT Time Calculation (min) (ACUTE ONLY): 25 min   Charges:   PT  Evaluation $Initial PT Evaluation Tier I: 1 Procedure PT Treatments $Self Care/Home Management: 8-22   PT G CodesDuncan Dull 05-24-2015, 2:20 PM Alben Deeds, [place] DPT  [phone]

## 2015-05-12 ENCOUNTER — Ambulatory Visit (HOSPITAL_COMMUNITY): Payer: Medicare Other

## 2015-05-12 DIAGNOSIS — I6350 Cerebral infarction due to unspecified occlusion or stenosis of unspecified cerebral artery: Secondary | ICD-10-CM

## 2015-05-12 LAB — HEMOGLOBIN A1C
Hgb A1c MFr Bld: 5.8 % — ABNORMAL HIGH (ref 4.8–5.6)
Mean Plasma Glucose: 120 mg/dL

## 2015-05-12 MED ORDER — MECLIZINE HCL 12.5 MG PO TABS
25.0000 mg | ORAL_TABLET | Freq: Three times a day (TID) | ORAL | Status: DC | PRN
Start: 2015-05-12 — End: 2015-05-15
  Administered 2015-05-12 – 2015-05-15 (×5): 25 mg via ORAL
  Filled 2015-05-12 (×6): qty 2

## 2015-05-12 MED ORDER — PRAVASTATIN SODIUM 40 MG PO TABS: 40.0000 mg | ORAL_TABLET | Freq: Every day | ORAL | Status: DC

## 2015-05-12 NOTE — Progress Notes (Signed)
Patient refused discharge, MD is aware.

## 2015-05-12 NOTE — Progress Notes (Signed)
Unable to scheduled follow up appointment, attempted 3x to call patient's PCP.

## 2015-05-12 NOTE — Clinical Social Work Note (Signed)
CSW met the pt at bedside. CSW introduced self and purpose of the visit. Pt reported that she needs to go to SNF for rehab. CSW explained the SNF process. CSW explained insurance and its relation to SNF placement. CSW informed the pt that [facility] will not give authorization for SNF given the medical teams recommendations. CSW discussed the option of paying privately for SNF. Pt reported that she cannot pay. Pt insist that her insurance will cover her SNF stay. CSW attempted again to explain insurance and its relation to SNF placement. At this time CSW will sign off.   [place], MSW, [place]

## 2015-05-12 NOTE — Care Management Important Message (Signed)
Important Message  Patient Details  Name: Tiffany Black MRN: [id number] Date of Birth: [birth year]   Medicare Important Message Given:  Yes-second notification given.  CM received a call that patient was considering a Medicare appeal.  CM provided patient with an additional copy of the Important Message to ensure that she had all necessary information.  Patient verbalized understanding of appeal process.    Rolm Baptise, RN 05/12/2015, 2:49 PM

## 2015-05-12 NOTE — Progress Notes (Signed)
  Echocardiogram 2D Echocardiogram has been performed.  Tiffany Black 05/12/2015, 2:14 PM

## 2015-05-12 NOTE — Discharge Summary (Addendum)
PATIENT DETAILS Name: Tiffany Black Age: 81 y.o. Sex: female Date of Birth: 1933/09/30 MRN: [id number]. Admitting Physician: Domenic Polite, MD BLT:JQZES,PQZRAQT S, MD  Admit Date: 05/10/2015 Discharge date: 05/15/2015  Recommendations for Outpatient Follow-up:  1. Age appropriate general health maintenance  2. Please repeat A1c and lipid panel in 3 months  3. Discontinued felodipine because of orthostatic vital signs, please reassess at next visit.    PRIMARY DISCHARGE DIAGNOSIS:  Principal Problem:   Stroke Active Problems:   Essential hypertension, benign   Atrial fibrillation   Vertigo   Anxiety   Dizziness   Orthostatic hypotension   CVA (cerebral infarction)      PAST MEDICAL HISTORY: Past Medical History  Diagnosis Date  . Hypertension   . Arthritis   . 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 Endometrial ca   . Anxiety   . HLD (hyperlipidemia)   . Cardiomegaly   . Pectus excavatum   . OA (osteoarthritis)   . Vertigo   . Hx of echocardiogram     a. Echo (11/15): Mild LVH, EF 55-60%, normal wall motion, grade 2 diastolic dysfunction, mild LAE, normal RV function, PASP 29 mm Hg    DISCHARGE MEDICATIONS: Current Discharge Medication List    START taking these medications   Details  pravastatin (PRAVACHOL) 40 MG tablet Take 1 tablet (40 mg total) by mouth daily at 6 PM. Qty: 60 tablet, Refills: 0      CONTINUE these medications which have CHANGED   Details  DIOVAN 160 MG tablet Take 1 tablet (160 mg total) by mouth 2 (two) times daily. Qty: 60 tablet, Refills: 0      CONTINUE these medications which have NOT CHANGED   Details  acetaminophen (TYLENOL) 325 MG tablet Take 650 mg by mouth every 6 (six) hours as needed for headache.    cholecalciferol (VITAMIN D) 1000 UNITS tablet Take 1,000 Units by mouth daily.    FLUoxetine  (PROZAC) 10 MG tablet Take 10 mg by mouth daily.     KLOR-CON M20 20 MEQ tablet Take 10 mEq by mouth daily.     labetalol (NORMODYNE) 200 MG tablet Take 200 mg by mouth in the am, 200 mg by mouth in the pm, can take an extra 200 mg by mouth if BP will not come down.    meclizine (ANTIVERT) 25 MG tablet Take 25 mg by mouth 2 (two) times daily. Dizziness    metoprolol tartrate (LOPRESSOR) 25 MG tablet Take 1 tablet (25 mg total) by mouth once as needed. For tachypalpitations Qty: 10 tablet, Refills: 0    PROCTOSOL HC 2.5 % rectal cream Place 1 application rectally as needed. hemrrohids    XARELTO 20 MG TABS tablet TAKE ONE TABLET BY MOUTH ONCE DAILY Qty: 90 tablet, Refills: 0    zolpidem (AMBIEN) 5 MG tablet Take 5 mg by mouth at bedtime. For sleep      STOP taking these medications     felodipine (PLENDIL) 5 MG 24 hr tablet         ALLERGIES:   Allergies  Allergen Reactions  . Carbamazepine Other (See Comments)    unknown  . Codeine Nausea And Vomiting    Nausea and vomiting  . Hydrochlorothiazide Other (See Comments)    unknown  . Penicillins Hives    Hives   . Sulfonamide Derivatives Hives  Hives     BRIEF HPI:  See H&P, Labs, Consult and Test reports for all details in brief, patient is a 81 y.o. female with past medical history of essential hypertension, Vertigo, ? BPPV, P.Afib on Xarelto, Dyslipidemia presented to the ER for evaluation of dizziness.   CONSULTATIONS:   neurology  PERTINENT RADIOLOGIC STUDIES: Dg Chest 2 View  05/10/2015   CLINICAL DATA:  Dizziness and weakness for 5 days.  EXAM: CHEST  2 VIEW  COMPARISON:  08/24/2014  FINDINGS: Lungs are clear without airspace disease or pulmonary edema. Heart and mediastinum are within normal limits. No large pleural effusions. Degenerative changes in the thoracic spine.  IMPRESSION: No active cardiopulmonary disease.   Electronically Signed   By: Markus Daft M.D.   On: 05/10/2015 11:05   [name]  Contrast  05/10/2015   CLINICAL DATA:  81 year old female with history of vertigo since 2014. Medication not helping. Dizziness on motion. History of hypertension, atrial fibrillation, endometrial carcinoma and hyperlipidemia. Initial encounter.  EXAM: MRI HEAD WITHOUT CONTRAST  TECHNIQUE: Multiplanar, multiecho pulse sequences of the brain and surrounding structures were obtained without intravenous contrast.  COMPARISON:  02/27/2013.  FINDINGS: Exam is motion degraded.  Tiny acute nonhemorrhagic posterior right temporal lobe infarct.  Remote left frontal lobe infarcts with wallerian degeneration and left cerebral peduncle mineral deposition unchanged.  Tiny blood breakdown products right middle cerebral peduncle and left frontal lobe consistent with prior hemorrhagic ischemia without acute intracranial hemorrhage noted.  Small vessel disease type changes most notable periventricular region.  Mild atrophy without hydrocephalus.  No intracranial mass lesion noted on this unenhanced exam.  Major intracranial vascular structures are patent.  Mild transverse ligament hypertrophy. Facet joint degenerative changes left aspect upper cervical spine.  Cervical medullary junction and pineal region unremarkable.  Partially empty non expanded sella felt to be an incidental finding.  Post lens replacement. Slight prominence peri optic spaces, within normal limits.  Paranasal sinus mucosal thickening most notable left maxillary sinus.  IMPRESSION: Exam is motion degraded.  Tiny acute nonhemorrhagic posterior right temporal lobe infarct.  Remote left frontal lobe infarcts with wallerian degeneration unchanged.  Small vessel disease type changes most notable periventricular region.  Mild atrophy without hydrocephalus.  Paranasal sinus mucosal thickening most notable left maxillary sinus.   Electronically Signed   By: Genia Del M.D.   On: 05/10/2015 12:52   Mr Jodene Nam Head/brain Wo Cm  05/11/2015   CLINICAL DATA:  Intermittent  dizziness, fluctuating blood pressure. History of hypertension and vertigo, atrial fibrillation, dyslipidemia.  EXAM: MRA HEAD WITHOUT CONTRAST  TECHNIQUE: Angiographic images of the Circle of Willis were obtained using MRA technique without intravenous contrast.  COMPARISON:  MRI of the brain May 10, 2015  FINDINGS: Anterior circulation: Normal flow related enhancement of the included cervical, petrous, cavernous and supra clinoid internal carotid arteries. Patent anterior communicating artery. Normal flow related enhancement of the anterior and middle cerebral arteries, including more distal segments. Focal signal loss within the distal LEFT middle cerebral artery segments corresponds to tortuosity on raw data.  No large vessel occlusion, high-grade stenosis, aneurysm.  Posterior circulation: RIGHT vertebral artery is dominant. Basilar artery is patent, with normal flow related enhancement of the main branch vessels. RIGHT posterior cerebral artery occlusion at distal RIGHT P1 segment. Moderate luminal irregularity LEFT posterior cerebral artery.  No high-grade stenosis, aneurysm.  IMPRESSION: Occluded RIGHT posterior cerebral artery at the level of P1, possibly acute.  Moderate luminal irregularity of LEFT posterior cerebral  artery compatible with atherosclerosis.  Focal high-grade stenosis distal LEFT MCA segment, which may be artifact.   Electronically Signed   By: Elon Alas M.D.   On: 05/11/2015 03:17     PERTINENT LAB RESULTS: CBC: No results for input(s): WBC, HGB, HCT, PLT in the last 72 hours. CMET CMP     Component Value Date/Time   NA 140 05/10/2015 1052   K 4.4 05/10/2015 1052   CL 107 05/10/2015 1052   CO2 26 05/10/2015 1052   GLUCOSE 98 05/10/2015 1052   BUN 11 05/10/2015 1052   CREATININE 0.86 05/10/2015 1052   CALCIUM 9.4 05/10/2015 1052   PROT 7.5 07/29/2010 2000   ALBUMIN 4.4 07/29/2010 2000   AST 26 07/29/2010 2000   ALT 18 07/29/2010 2000   ALKPHOS 74 07/29/2010  2000   BILITOT 0.5 07/29/2010 2000   GFRNONAA >60 05/10/2015 1052   GFRAA >60 05/10/2015 1052    GFR Estimated Creatinine Clearance: 55.9 mL/min (by C-G formula based on Cr of 0.86). No results for input(s): LIPASE, AMYLASE in the last 72 hours. No results for input(s): CKTOTAL, CKMB, CKMBINDEX, TROPONINI in the last 72 hours. Invalid input(s): POCBNP No results for input(s): DDIMER in the last 72 hours. No results for input(s): HGBA1C in the last 72 hours. No results for input(s): CHOL, HDL, LDLCALC, TRIG, CHOLHDL, LDLDIRECT in the last 72 hours. No results for input(s): TSH, T4TOTAL, T3FREE, THYROIDAB in the last 72 hours.  Invalid input(s): FREET3 No results for input(s): VITAMINB12, FOLATE, FERRITIN, TIBC, IRON, RETICCTPCT in the last 72 hours. Coags: No results for input(s): INR in the last 72 hours.  Invalid input(s): PT Microbiology: Recent Results (from the past 240 hour(s))  Urine culture     Status: None   Collection Time: 05/10/15 11:31 AM  Result Value Ref Range Status   Specimen Description URINE, CLEAN CATCH  Final   Special Requests NONE  Final   Culture NO GROWTH 1 DAY  Final   Report Status 05/11/2015 FINAL  Final     BRIEF HOSPITAL COURSE:  Orthostatic hypotension: Improved. Continue labetalol and Diovan.Hold felodipine. Allow mild permissive hypertension given orthostatic hypotension follow BP trend and adjust accordingly.  Active Problems: Acute CVA: Likely an incidental finding.Likely an incidental finding.Carotids did not show any significant stenosis.Echo neg for embolic source. LDL 140 (goal less than 70)-continue statinon discharge , A1c5.8. Continue Xarelto. Non focal on exam at time of discharge (note-spoke with neurology-Dr Xu-personally on 8/31-okay to discharge to further recommendations)  Essential hypertension, benign: Allow permissive hypertension in the setting of orthostatic hypotension and acute CVA. Continue labetalol and Avapro.Have  increased Avapro to 150 mg daily-and have adjusted Diovan dosing on discharge summary. Have tried multiple times to explain to patient that she will need continued adjustment of her blood pressure regimen-and may need to allow some mild permissive hypertension to prevent episodes of orthostatic hypotension. She feels like this needs to be done inpatient-I have tried my best to explain to her that this is okay to be done in the outpatient setting as she has numerous physicians that are working with her.  Chronic vertigo:contine Meclizine. Chronic issue. Please note, continues to complain of intermittent vertigo-but per RN yesterday while she complained of vertigo-she was able to walk to the bathroom unassisted.  Paroxysmal Atrial fibrillation: Sinus rhythm-continue Xarelto.CHA2DS2-VASc Score 6  Dyslipidemia:LDL 140 (goal less than 70)-continue statin. Repeat LDL in 3 months-not sure if patient will continue taking statin in the long-term-she has indicated to  me that she likely will not continue taking statin on discharge. I've explained the importance of continuing to take statin to prevent further CVAs.  Anxiety/Depression:continue Prozac. She is stable-but continues to have some anxiety issues that are CHRONIC. Patient will need follow up with PCP as outpatient  Asymptomatic bacteriuria: No indication for antibiotics. Furthermore-urine culture was negative.  Chronic neck pain: She sees Dr. Shelly Flatten she recently had a MRI of the neck-and was told she needed anti-inflammatory. No deficits on exam. Have explained to her repeatedly, that since this is a chronic issue (per patient going on for years) that this is stable to be addressed in the outpatient setting.  TODAY-DAY OF DISCHARGE:  Subjective:   Marializ Ferrebee today has no headache,no chest abdominal pain,no new weakness tingling or numbness  Objective:   Blood pressure 138/68, pulse 62, temperature 98.1 F (36.7 C), temperature source  Oral, resp. rate 18, height 5\' 5"  (1.651 m), weight 81.647 kg (180 lb), SpO2 97 %.  Intake/Output Summary (Last 24 hours) at 05/15/15 0954 Last data filed at 05/14/15 1820  Gross per 24 hour  Intake    480 ml  Output      0 ml  Net    480 ml   Filed Weights   05/10/15 1006  Weight: 81.647 kg (180 lb)    Exam Awake Alert, Oriented *3, No new F.N deficits, Normal affect .AT,PERRAL Supple Neck,No JVD, No cervical lymphadenopathy appriciated.  Symmetrical Chest wall movement, Good air movement bilaterally, CTAB RRR,No Gallops,Rubs or new Murmurs, No Parasternal Heave +ve B.Sounds, Abd Soft, Non tender, No organomegaly appriciated, No rebound -guarding or rigidity. No Cyanosis, Clubbing or edema, No new Rash or bruise  DISCHARGE CONDITION: Stable  DISPOSITION: Home with home health services  DISCHARGE INSTRUCTIONS:    Activity:  As tolerated with Full fall precautions use walker/cane & assistance as needed  Get Medicines reviewed and adjusted: Please take all your medications with you for your next visit with your Primary MD  Please request your Primary MD to go over all hospital tests and procedure/radiological results at the follow up, please ask your Primary MD to get all Hospital records sent to his/her office.  If you experience worsening of your admission symptoms, develop shortness of breath, life threatening emergency, suicidal or homicidal thoughts you must seek medical attention immediately by calling 911 or calling your MD immediately  if symptoms less severe.  You must read complete instructions/literature along with all the possible adverse reactions/side effects for all the Medicines you take and that have been prescribed to you. Take any new Medicines after you have completely understood and accpet all the possible adverse reactions/side effects.   Do not drive when taking Pain medications.   Do not take more than prescribed Pain, Sleep and Anxiety  Medications  Special Instructions: If you have smoked or chewed Tobacco  in the last 2 yrs please stop smoking, stop any regular Alcohol  and or any Recreational drug use.  Wear Seat belts while driving.  Please note  You were cared for by a hospitalist during your hospital stay. Once you are discharged, your primary care physician will handle any further medical issues. Please note that NO REFILLS for any discharge medications will be authorized once you are discharged, as it is imperative that you return to your primary care physician (or establish a relationship with a primary care physician if you do not have one) for your aftercare needs so that they can reassess your need  for medications and monitor your lab values.   Diet recommendation: Heart Healthy diet  Discharge Instructions    Call MD for:  persistant dizziness or light-headedness    Complete by:  As directed      Diet - low sodium heart healthy    Complete by:  As directed      Increase activity slowly    Complete by:  As directed            Follow-up Information    Follow up with Xu,Jindong, MD. Schedule an appointment as soon as possible for a visit in 2 months.   Specialty:  Neurology   Why:  stroke clinic   Contact information:   [address] [place] [address] [phone]       Follow up with [name], MD. Schedule an appointment as soon as possible for a visit in 1 week.   Specialty:  Family Medicine   Contact information:   [place] [address] [phone]       Total Time spent on discharge equals 345 minutes.  SignedOren Binet 05/15/2015 9:54 AM

## 2015-05-12 NOTE — Care Management Note (Signed)
Case Management Note  Patient Details  Name: Stephanieann Popescu MRN: [id number] Date of Birth: 02-08-1934  Subjective/Objective:                    Action/Plan: Met with patient to discuss discharge planning. Patient states that she is not interested in home health because she does not feel that it will be beneficial.  Patient states that she was told that she would be staying in the hospital for 1-2 more days to monitor her blood pressure.  CM spoke with neurology as well as the attending MD, who verified that patient is ready for discharge home today.  Patient stated concerns about her potassium pill being discontinued.  CM reassured patient that it will be continued as a home medication, per conversation with attending MD.  Expected Discharge Date:                  Expected Discharge Plan:  Home/Self Care  In-House Referral:     Discharge planning Services  CM Consult  Post Acute Care Choice:    Choice offered to:     DME Arranged:    DME Agency:     HH Arranged:    [place] Agency:     Status of Service:  Completed, signed off  Medicare Important Message Given:    Date Medicare IM Given:    Medicare IM give by:    Date Additional Medicare IM Given:    Additional Medicare Important Message give by:     If discussed at [place] of Stay Meetings, dates discussed:    Additional Comments:  Rolm Baptise, RN 05/12/2015, 2:42 PM

## 2015-05-13 ENCOUNTER — Encounter (HOSPITAL_COMMUNITY): Payer: Self-pay | Admitting: General Practice

## 2015-05-13 MED ORDER — LABETALOL HCL 100 MG PO TABS
100.0000 mg | ORAL_TABLET | Freq: Once | ORAL | Status: AC
  Administered 2015-05-13: 100 mg via ORAL
  Filled 2015-05-13: qty 1

## 2015-05-13 NOTE — Progress Notes (Signed)
Through out the day when ever patient is given explanation concerning care or medications or discharge she has difficulty recalling conversations and medications given as well as procedures performed and there outcomes. Will continue to monitor patient.

## 2015-05-13 NOTE — Progress Notes (Addendum)
PATIENT DETAILS Name: Tiffany Black Age: 81 y.o. Sex: female Date of Birth: [birth year] Admit Date: 05/10/2015 Admitting Physician Domenic Polite, MD LTJ:[id number] S, MD  Subjective: No dizziness on standing.   Assessment/Plan: Principal Problem: Orthostatic hypotension: Improved. Continue labetalol and Diovan.Hold felodipine. Allow mild permissive hypertension given orthostatic hypotension follow BP trend and adjust accordingly.  Active Problems: Acute CVA: Likely an incidental finding.Likely an incidental finding.Carotids did not show any significant stenosis.Echo neg for embolic source. LDL 140 (goal less than 70)-continue statinon discharge , A1c5.8. Continue Xarelto. Non focal on exam at time of discharge (note-spoke with neurology-Dr Xu-personally on 8/31-okay to discharge to further recommendations)  Essential hypertension, benign: Allow permissive hypertension in the setting of orthostatic hypotension and acute CVA. Continue labetalol and Diovan.Hold felodipine.  Chronic vertigo:contine Meclizine. Chronic issue.  Paroxysmal Atrial fibrillation: Sinus rhythm-continue Xarelto.CHA2DS2-VASc Score 6  Dyslipidemia:LDL 140 (goal less than 70)-continue statin. Repeat LDL in 3 months  Anxiety/Depression:continue prozac  Asymptomatic bacteriuria: No indication for antibiotics.  Disposition: Home today-explained to patient she is stable to be discharged  Antimicrobial agents  See below  Anti-infectives    Start     Dose/Rate Route Frequency Ordered Stop   05/10/15 1315  ciprofloxacin (CIPRO) IVPB 400 mg     400 mg 200 mL/hr over 60 Minutes Intravenous  Once 05/10/15 1306 05/10/15 1448      DVT Prophylaxis: Xarelto  Code Status: Full code   Family Communication None at bedside-but left message for [phone]  Procedures: None  CONSULTS:  neurology  Time spent 15 minutes-Greater than 50% of this time was spent in counseling,  explanation of diagnosis, planning of further management, and coordination of care.  MEDICATIONS: Scheduled Meds: . FLUoxetine  10 mg Oral Daily  . irbesartan  75 mg Oral Daily  . labetalol  200 mg Oral BID  . pravastatin  40 mg Oral q1800  . rivaroxaban  20 mg Oral Q supper   Continuous Infusions:  PRN Meds:.acetaminophen, meclizine, senna-docusate, zolpidem    PHYSICAL EXAM: Vital signs in last 24 hours: Filed Vitals:   05/12/15 1843 05/12/15 2203 05/13/15 0140 05/13/15 0500  BP: 154/67 138/58 155/68 155/69  Pulse: 64 55 59 59  Temp: 98.9 F (37.2 C) 98.2 F (36.8 C) 98.6 F (37 C) 98.5 F (36.9 C)  TempSrc: Oral Oral Oral Oral  Resp: 20 20 20 20   Height:      Weight:      SpO2: 97% 98% 98% 95%    Weight change:  Filed Weights   05/10/15 1006  Weight: 81.647 kg (180 lb)   Body mass index is 29.95 kg/(m^2).   Gen Exam: Awake and alert with clear speech.   Neck: Supple, No JVD.   Chest: B/L Clear.   CVS: S1 S2 Regular, no murmurs.  Abdomen: soft, BS +, non tender, non distended. Extremities: no edema, lower extremities warm to touch. Neurologic: Non Focal.   Skin: No Rash.   Wounds: N/A.    Intake/Output from previous day: No intake or output data in the 24 hours ending 05/13/15 1023   LAB RESULTS: CBC  Recent Labs Lab 05/10/15 1052  WBC 7.4  HGB 14.3  HCT 43.8  PLT 210  MCV 94.2  MCH 30.8  MCHC 32.6  RDW 13.3  LYMPHSABS 1.6  MONOABS 0.6  EOSABS 0.2  BASOSABS 0.0    Chemistries   Recent Labs Lab 05/10/15 1052  NA 140  K 4.4  CL 107  CO2 26  GLUCOSE 98  BUN 11  CREATININE 0.86  CALCIUM 9.4    CBG: No results for input(s): GLUCAP in the last 168 hours.  GFR Estimated Creatinine Clearance: 55.9 mL/min (by C-G formula based on Cr of 0.86).  Coagulation profile No results for input(s): INR, PROTIME in the last 168 hours.  Cardiac Enzymes  Recent Labs Lab 05/10/15 1052  TROPONINI <0.03    Invalid input(s): POCBNP No  results for input(s): DDIMER in the last 72 hours.  Recent Labs  05/11/15 0531  HGBA1C 5.8*    Recent Labs  05/11/15 0531  CHOL 196  HDL 25*  LDLCALC 140*  TRIG 154*  CHOLHDL 7.8   No results for input(s): TSH, T4TOTAL, T3FREE, THYROIDAB in the last 72 hours.  Invalid input(s): FREET3 No results for input(s): VITAMINB12, FOLATE, FERRITIN, TIBC, IRON, RETICCTPCT in the last 72 hours. No results for input(s): LIPASE, AMYLASE in the last 72 hours.  Urine Studies No results for input(s): UHGB, CRYS in the last 72 hours.  Invalid input(s): UACOL, UAPR, USPG, UPH, UTP, UGL, UKET, UBIL, UNIT, UROB, ULEU, UEPI, UWBC, URBC, UBAC, CAST, UCOM, BILUA  MICROBIOLOGY: Recent Results (from the past 240 hour(s))  Urine culture     Status: None   Collection Time: 05/10/15 11:31 AM  Result Value Ref Range Status   Specimen Description URINE, CLEAN CATCH  Final   Special Requests NONE  Final   Culture NO GROWTH 1 DAY  Final   Report Status 05/11/2015 FINAL  Final    RADIOLOGY STUDIES/RESULTS: Dg Chest 2 View  05/10/2015   CLINICAL DATA:  Dizziness and weakness for 5 days.  EXAM: CHEST  2 VIEW  COMPARISON:  08/24/2014  FINDINGS: Lungs are clear without airspace disease or pulmonary edema. Heart and mediastinum are within normal limits. No large pleural effusions. Degenerative changes in the thoracic spine.  IMPRESSION: No active cardiopulmonary disease.   Electronically Signed   By: Markus Daft M.D.   On: 05/10/2015 11:05   [name] Contrast  05/10/2015   CLINICAL DATA:  81 year old female with history of vertigo since 2014. Medication not helping. Dizziness on motion. History of hypertension, atrial fibrillation, endometrial carcinoma and hyperlipidemia. Initial encounter.  EXAM: MRI HEAD WITHOUT CONTRAST  TECHNIQUE: Multiplanar, multiecho pulse sequences of the brain and surrounding structures were obtained without intravenous contrast.  COMPARISON:  02/27/2013.  FINDINGS: Exam is motion  degraded.  Tiny acute nonhemorrhagic posterior right temporal lobe infarct.  Remote left frontal lobe infarcts with wallerian degeneration and left cerebral peduncle mineral deposition unchanged.  Tiny blood breakdown products right middle cerebral peduncle and left frontal lobe consistent with prior hemorrhagic ischemia without acute intracranial hemorrhage noted.  Small vessel disease type changes most notable periventricular region.  Mild atrophy without hydrocephalus.  No intracranial mass lesion noted on this unenhanced exam.  Major intracranial vascular structures are patent.  Mild transverse ligament hypertrophy. Facet joint degenerative changes left aspect upper cervical spine.  Cervical medullary junction and pineal region unremarkable.  Partially empty non expanded sella felt to be an incidental finding.  Post lens replacement. Slight prominence peri optic spaces, within normal limits.  Paranasal sinus mucosal thickening most notable left maxillary sinus.  IMPRESSION: Exam is motion degraded.  Tiny acute nonhemorrhagic posterior right temporal lobe infarct.  Remote left frontal lobe infarcts with wallerian degeneration unchanged.  Small vessel disease type changes most notable periventricular region.  Mild atrophy without  hydrocephalus.  Paranasal sinus mucosal thickening most notable left maxillary sinus.   Electronically Signed   By: Genia Del M.D.   On: 05/10/2015 12:52   Mr Jodene Nam Head/brain Wo Cm  05/11/2015   CLINICAL DATA:  Intermittent dizziness, fluctuating blood pressure. History of hypertension and vertigo, atrial fibrillation, dyslipidemia.  EXAM: MRA HEAD WITHOUT CONTRAST  TECHNIQUE: Angiographic images of the Circle of Willis were obtained using MRA technique without intravenous contrast.  COMPARISON:  MRI of the brain May 10, 2015  FINDINGS: Anterior circulation: Normal flow related enhancement of the included cervical, petrous, cavernous and supra clinoid internal carotid arteries.  Patent anterior communicating artery. Normal flow related enhancement of the anterior and middle cerebral arteries, including more distal segments. Focal signal loss within the distal LEFT middle cerebral artery segments corresponds to tortuosity on raw data.  No large vessel occlusion, high-grade stenosis, aneurysm.  Posterior circulation: RIGHT vertebral artery is dominant. Basilar artery is patent, with normal flow related enhancement of the main branch vessels. RIGHT posterior cerebral artery occlusion at distal RIGHT P1 segment. Moderate luminal irregularity LEFT posterior cerebral artery.  No high-grade stenosis, aneurysm.  IMPRESSION: Occluded RIGHT posterior cerebral artery at the level of P1, possibly acute.  Moderate luminal irregularity of LEFT posterior cerebral artery compatible with atherosclerosis.  Focal high-grade stenosis distal LEFT MCA segment, which may be artifact.   Electronically Signed   By: Elon Alas M.D.   On: 05/11/2015 03:17    Oren Binet, MD  [facility] Pager:[phone]  If 7PM-7AM, please contact night-coverage [email or web address] Password [email or web address] 05/13/2015, 10:23 AM   LOS: 3 days

## 2015-05-13 NOTE — Progress Notes (Addendum)
Patient being discharged home, she is alert oriented, discharge summary with follow up appointments reviewed no IV patient waiting for son to transport.  Patient is refusing to review discharge summary, she has been advised that she is discharge and needs to arrange transportation or it will be provided.

## 2015-05-13 NOTE — Progress Notes (Signed)
PT Cancellation Note  Patient Details Name: Tiffany Black MRN: [id number] DOB: 1934/08/15   Cancelled Treatment:    Reason Eval/Treat Not Completed: Other (comment);  RN reports patient for imminent d/c and to defer PT today.   Finneas Mathe,CYNDI 05/13/2015, 3:51 PM  Magda Kiel, [place] 05/13/2015

## 2015-05-14 MED ORDER — IRBESARTAN 150 MG PO TABS
150.0000 mg | ORAL_TABLET | Freq: Every day | ORAL | Status: DC
  Administered 2015-05-14 – 2015-05-15 (×2): 150 mg via ORAL
  Filled 2015-05-14 (×2): qty 1

## 2015-05-14 MED ORDER — DIOVAN 160 MG PO TABS: 160.0000 mg | ORAL_TABLET | Freq: Two times a day (BID) | ORAL | Status: DC

## 2015-05-14 MED ORDER — PRAVASTATIN SODIUM 40 MG PO TABS: 40.0000 mg | ORAL_TABLET | Freq: Every day | ORAL | Status: DC

## 2015-05-14 NOTE — Progress Notes (Signed)
Patients insurance appeal was denied she is required to leave hospital by noon 05-15-15, Case manger has made arrangements for patient to receive home health services. I called son Tiffany Black at the number provided and left a message requesting that he make arrangements for his mother to get into their house. Will pass this information on to oncoming nurse to make available to morning staff.

## 2015-05-14 NOTE — Progress Notes (Signed)
PATIENT DETAILS Name: Tiffany Black Age: 81 y.o. Sex: female Date of Birth: 03/07/1934 Admit Date: 05/10/2015 Admitting Physician Domenic Polite, MD VPX:[id number],RSWNIOE S, MD  Subjective: Has multitude of complaints-that have been going on for years-complains of intermittent vertigo (chronic issue), complains of left neck pain for which she sees Dr. Ellene Route. No acute complaints. Per RN-yesterday when complaining of dizziness "jumped up" and went to the bathroom without any assistance.  Assessment/Plan: Principal Problem: Orthostatic hypotension: Improved. Continue labetalol and Diovan.Hold felodipine. Allow mild permissive hypertension given orthostatic hypotension follow BP trend and adjust accordingly.  Active Problems: Acute CVA: Likely an incidental finding.Likely an incidental finding.Carotids did not show any significant stenosis.Echo neg for embolic source. LDL 140 (goal less than 70)-continue statinon discharge , A1c5.8. Continue Xarelto. Non focal on exam at time of discharge (note-spoke with neurology-Dr Xu-personally on 8/31-okay to discharge to further recommendations)  Essential hypertension, benign: Allow permissive hypertension in the setting of orthostatic hypotension and acute CVA. Continue labetalol and Avapro.Have increased Avapro to 150 mg daily-and have adjusted Diovan dosing on discharge summary. Have tried multiple times to explain to patient that she will need continued adjustment of her blood pressure regimen-and may need to allow some mild permissive hypertension to prevent episodes of orthostatic hypotension. She feels like this needs to be done inpatient-I have tried my best to explain to her that this is okay to be done in the outpatient setting as she has numerous physicians that are working with her.  Chronic vertigo:contine Meclizine. Chronic issue. Please note, continues to complain of intermittent vertigo-but per RN yesterday while she complained  of vertigo-she was able to walk to the bathroom unassisted.  Paroxysmal Atrial fibrillation: Sinus rhythm-continue Xarelto.CHA2DS2-VASc Score 6  Dyslipidemia:LDL 140 (goal less than 70)-continue statin. Repeat LDL in 3 months-not sure if patient will continue taking statin in the long-term-she has indicated to me that she likely will not continue taking statin on discharge. I've explained the importance of continuing to take statin to prevent further CVAs.  Anxiety/Depression:continue prozac  Asymptomatic bacteriuria: No indication for antibiotics. Furthermore-urine culture was negative.  Chronic neck pain: She sees Dr. Shelly Flatten she recently had a MRI of the neck-and was told she needed anti-inflammatory. No deficits on exam. Have explained to her repeatedly, that since this is a chronic issue (per patient going on for years) that this is stable to be addressed in the outpatient setting.  Disposition: Home today-explained to patient she is stable to be discharged with close outpatient follow-up with her regular doctors. He insists that she be transferred to SNF-I've tried my best to explain that per social work-she will not have authorization from her insurance company for SNF. She is apparently unable to pay out of pocket.  Antimicrobial agents  See below  Anti-infectives    Start     Dose/Rate Route Frequency Ordered Stop   05/10/15 1315  ciprofloxacin (CIPRO) IVPB 400 mg     400 mg 200 mL/hr over 60 Minutes Intravenous  Once 05/10/15 1306 05/10/15 1448      DVT Prophylaxis: Xarelto  Code Status: Full code   Family Communication None at bedside-but left message for [phone] yesterday. Patient is awake and alert-and fully understanding of her care  Procedures: None  CONSULTS:  neurology  Time spent 15 minutes-Greater than 50% of this time was spent in counseling, explanation of diagnosis, planning of further management, and coordination of  care.  MEDICATIONS: Scheduled Meds: . FLUoxetine  10 mg Oral Daily  . irbesartan  150 mg Oral Daily  . labetalol  200 mg Oral BID  . pravastatin  40 mg Oral q1800  . rivaroxaban  20 mg Oral Q supper   Continuous Infusions:  PRN Meds:.acetaminophen, meclizine, senna-docusate, zolpidem    PHYSICAL EXAM: Vital signs in last 24 hours: Filed Vitals:   05/13/15 2306 05/14/15 0153 05/14/15 0606 05/14/15 1015  BP: 177/77 148/66 153/76 142/74  Pulse: 57 59 67 64  Temp:  98 F (36.7 C) 98.2 F (36.8 C) 98.1 F (36.7 C)  TempSrc:  Oral Oral Oral  Resp:  18 18 18   Height:      Weight:      SpO2: 98%  97% 96%    Weight change:  Filed Weights   05/10/15 1006  Weight: 81.647 kg (180 lb)   Body mass index is 29.95 kg/(m^2).   Gen Exam: Awake and alert with clear speech.   Neck: Supple, No JVD.   Chest: B/L Clear.   CVS: S1 S2 Regular, no murmurs.  Abdomen: soft, BS +, non tender, non distended. Extremities: no edema, lower extremities warm to touch. Neurologic: Non Focal.   Skin: No Rash.   Wounds: N/A.    Intake/Output from previous day:  Intake/Output Summary (Last 24 hours) at 05/14/15 1058 Last data filed at 05/14/15 0836  Gross per 24 hour  Intake    480 ml  Output      0 ml  Net    480 ml     LAB RESULTS: CBC  Recent Labs Lab 05/10/15 1052  WBC 7.4  HGB 14.3  HCT 43.8  PLT 210  MCV 94.2  MCH 30.8  MCHC 32.6  RDW 13.3  LYMPHSABS 1.6  MONOABS 0.6  EOSABS 0.2  BASOSABS 0.0    Chemistries   Recent Labs Lab 05/10/15 1052  NA 140  K 4.4  CL 107  CO2 26  GLUCOSE 98  BUN 11  CREATININE 0.86  CALCIUM 9.4    CBG: No results for input(s): GLUCAP in the last 168 hours.  GFR Estimated Creatinine Clearance: 55.9 mL/min (by C-G formula based on Cr of 0.86).  Coagulation profile No results for input(s): INR, PROTIME in the last 168 hours.  Cardiac Enzymes  Recent Labs Lab 05/10/15 1052  TROPONINI <0.03    Invalid input(s):  POCBNP No results for input(s): DDIMER in the last 72 hours. No results for input(s): HGBA1C in the last 72 hours. No results for input(s): CHOL, HDL, LDLCALC, TRIG, CHOLHDL, LDLDIRECT in the last 72 hours. No results for input(s): TSH, T4TOTAL, T3FREE, THYROIDAB in the last 72 hours.  Invalid input(s): FREET3 No results for input(s): VITAMINB12, FOLATE, FERRITIN, TIBC, IRON, RETICCTPCT in the last 72 hours. No results for input(s): LIPASE, AMYLASE in the last 72 hours.  Urine Studies No results for input(s): UHGB, CRYS in the last 72 hours.  Invalid input(s): UACOL, UAPR, USPG, UPH, UTP, UGL, UKET, UBIL, UNIT, UROB, ULEU, UEPI, UWBC, URBC, UBAC, CAST, UCOM, BILUA  MICROBIOLOGY: Recent Results (from the past 240 hour(s))  Urine culture     Status: None   Collection Time: 05/10/15 11:31 AM  Result Value Ref Range Status   Specimen Description URINE, CLEAN CATCH  Final   Special Requests NONE  Final   Culture NO GROWTH 1 DAY  Final   Report Status 05/11/2015 FINAL  Final    RADIOLOGY STUDIES/RESULTS: Dg Chest 2 View  05/10/2015   CLINICAL DATA:  Dizziness and weakness for 5 days.  EXAM: CHEST  2 VIEW  COMPARISON:  08/24/2014  FINDINGS: Lungs are clear without airspace disease or pulmonary edema. Heart and mediastinum are within normal limits. No large pleural effusions. Degenerative changes in the thoracic spine.  IMPRESSION: No active cardiopulmonary disease.   Electronically Signed   By: Markus Daft M.D.   On: 05/10/2015 11:05   [name] Contrast  05/10/2015   CLINICAL DATA:  81 year old female with history of vertigo since 2014. Medication not helping. Dizziness on motion. History of hypertension, atrial fibrillation, endometrial carcinoma and hyperlipidemia. Initial encounter.  EXAM: MRI HEAD WITHOUT CONTRAST  TECHNIQUE: Multiplanar, multiecho pulse sequences of the brain and surrounding structures were obtained without intravenous contrast.  COMPARISON:  02/27/2013.  FINDINGS:  Exam is motion degraded.  Tiny acute nonhemorrhagic posterior right temporal lobe infarct.  Remote left frontal lobe infarcts with wallerian degeneration and left cerebral peduncle mineral deposition unchanged.  Tiny blood breakdown products right middle cerebral peduncle and left frontal lobe consistent with prior hemorrhagic ischemia without acute intracranial hemorrhage noted.  Small vessel disease type changes most notable periventricular region.  Mild atrophy without hydrocephalus.  No intracranial mass lesion noted on this unenhanced exam.  Major intracranial vascular structures are patent.  Mild transverse ligament hypertrophy. Facet joint degenerative changes left aspect upper cervical spine.  Cervical medullary junction and pineal region unremarkable.  Partially empty non expanded sella felt to be an incidental finding.  Post lens replacement. Slight prominence peri optic spaces, within normal limits.  Paranasal sinus mucosal thickening most notable left maxillary sinus.  IMPRESSION: Exam is motion degraded.  Tiny acute nonhemorrhagic posterior right temporal lobe infarct.  Remote left frontal lobe infarcts with wallerian degeneration unchanged.  Small vessel disease type changes most notable periventricular region.  Mild atrophy without hydrocephalus.  Paranasal sinus mucosal thickening most notable left maxillary sinus.   Electronically Signed   By: Genia Del M.D.   On: 05/10/2015 12:52   Mr Jodene Nam Head/brain Wo Cm  05/11/2015   CLINICAL DATA:  Intermittent dizziness, fluctuating blood pressure. History of hypertension and vertigo, atrial fibrillation, dyslipidemia.  EXAM: MRA HEAD WITHOUT CONTRAST  TECHNIQUE: Angiographic images of the Circle of Willis were obtained using MRA technique without intravenous contrast.  COMPARISON:  MRI of the brain May 10, 2015  FINDINGS: Anterior circulation: Normal flow related enhancement of the included cervical, petrous, cavernous and supra clinoid internal  carotid arteries. Patent anterior communicating artery. Normal flow related enhancement of the anterior and middle cerebral arteries, including more distal segments. Focal signal loss within the distal LEFT middle cerebral artery segments corresponds to tortuosity on raw data.  No large vessel occlusion, high-grade stenosis, aneurysm.  Posterior circulation: RIGHT vertebral artery is dominant. Basilar artery is patent, with normal flow related enhancement of the main branch vessels. RIGHT posterior cerebral artery occlusion at distal RIGHT P1 segment. Moderate luminal irregularity LEFT posterior cerebral artery.  No high-grade stenosis, aneurysm.  IMPRESSION: Occluded RIGHT posterior cerebral artery at the level of P1, possibly acute.  Moderate luminal irregularity of LEFT posterior cerebral artery compatible with atherosclerosis.  Focal high-grade stenosis distal LEFT MCA segment, which may be artifact.   Electronically Signed   By: Elon Alas M.D.   On: 05/11/2015 03:17    Oren Binet, MD  [facility] Pager:[phone]  If 7PM-7AM, please contact night-coverage [email or web address] Password [email or web address] 05/14/2015, 10:58 AM   LOS: 4 days

## 2015-05-14 NOTE — Progress Notes (Signed)
Physical Therapy Treatment Patient Details Name: Tiffany Black MRN: [id number] DOB: Oct 27, 1933 Today's Date: 05-27-2015    History of Present Illness 81 y.o. female with history of known PAF on Xeralto and chronic vertigo presenting with dizziness, work up revealed R temporal lobe infarct.    PT Comments    Patient continues to be self limited with mobility due to anxiety.  Continue to feel psych or chapliancy involvement may be a good adjunct for her care and participation.  Will benefit from follow up [facility] services at d/c.  Follow Up Recommendations  Home health PT     Equipment Recommendations  Rolling walker with 5" wheels    Recommendations for Other Services       Precautions / Restrictions Precautions Precautions: None    Mobility  Bed Mobility Overal bed mobility: Independent                Transfers Overall transfer level: Independent Equipment used: None                Ambulation/Gait Ambulation/Gait assistance: Modified independent (Device/Increase time) Ambulation Distance (Feet): 150 Feet Assistive device: Rolling walker (2 wheeled) Gait Pattern/deviations: Step-through pattern     General Gait Details: Patient continues with anxiety needing much encouragement to walk out of the room.  c/o SOB with ambualtion today and despite having showered earlier today still is self limiting with mobility   Stairs            Wheelchair Mobility    Modified Rankin (Stroke Patients Only)       Balance Overall balance assessment: Independent                           High level balance activites: Side stepping High Level Balance Comments: side stepping, hip abduction, heel raises, hamstring curls, mini squats all x 5 reps w/ UE support    Cognition Arousal/Alertness: Awake/alert Behavior During Therapy: Anxious Overall Cognitive Status: No family/caregiver present to determine baseline cognitive functioning                       Exercises Other Exercises Other Exercises: seated scapular retraaction x 5 w/ 5 sec hold    General Comments        Pertinent Vitals/Pain Pain Assessment: Faces Faces Pain Scale: Hurts a little bit Pain Location: reports pain all over Pain Intervention(s): Monitored during session;Repositioned    Home Living                      Prior Function            PT Goals (current goals can now be found in the care plan section) Progress towards PT goals: Progressing toward goals    Frequency  Min 3X/week    PT Plan Current plan remains appropriate    Co-evaluation             End of Session   Activity Tolerance: Patient tolerated treatment well Patient left: in bed;with call bell/phone within reach     Time: 2947-6546 PT Time Calculation (min) (ACUTE ONLY): 23 min  Charges:  $Gait Training: 8-22 mins $Therapeutic Exercise: 8-22 mins                    G Codes:      Tiffany Black,Tiffany Black 2015/05/27, 4:43 PM  Tiffany Black, Tiffany Black 2015-05-27

## 2015-05-14 NOTE — Care Management Note (Signed)
Case Management Note  Patient Details  Name: Tiffany Black MRN: [id number] Date of Birth: Jan 11, 1934  Subjective/Objective:                    Action/Plan: Met with patient to discuss discharge planning. Patient has changed her mind and decided to discharge home with home health services.  Patient has chosen [facility]. Miranda with [facility] was notified and has accepted the referral.  Patient reports a great deal of anxiety regarding her discharge home, as her son is unavailable during the day.  Patient has been set up with HHPT/RN/HHaide and CSW.  Patient was also provided with a private duty agency list to help provide additional assistance throughout the day.  Patient has received the rolling walker from [facility].  Expected Discharge Date:                  Expected Discharge Plan:  [place]  In-House Referral:     Discharge planning Services  CM Consult  Post Acute Care Choice:  Durable Medical Equipment, Home Health Choice offered to:     DME Arranged:  [facility] Agency:  [place] Arranged:  RN, PT, Nurse's Aide (CSW) [facility] Agency:  [place]  Status of Service:  Completed, signed off  Medicare Important Message Given:  Yes-second notification given Date Medicare IM Given:    Medicare IM give by:    Date Additional Medicare IM Given:    Additional Medicare Important Message give by:     If discussed at [place] of Stay Meetings, dates discussed:    Additional Comments:  Rolm Baptise, RN 05/14/2015, 4:44 PM

## 2015-05-14 NOTE — Discharge Instructions (Signed)
Follow with Primary MD  [name], MD  and other consultant as instructed your Hospitalist MD  Please get a complete blood count and chemistry panel checked by your Primary MD at your next visit, and again as instructed by your Primary MD.  Get Medicines reviewed and adjusted. Please take all your medications with you for your next visit with your Primary MD  Please request your Primary MD to go over all hospital tests and procedure/radiological results at the follow up, please ask your Primary MD to get all Hospital records sent to his/her office.  If you experience worsening of your admission symptoms, develop shortness of breath, life threatening emergency, suicidal or homicidal thoughts you must seek medical attention immediately by calling 911 or calling your MD immediately  if symptoms less severe.  You must read complete instructions/literature along with all the possible adverse reactions/side effects for all the Medicines you take and that have been prescribed to you. Take any new Medicines after you have completely understood and accpet all the possible adverse reactions/side effects.   Do not drive when taking Pain medications.   Do not take more than prescribed Pain, Sleep and Anxiety Medications  Special Instructions: If you have smoked or chewed Tobacco  in the last 2 yrs please stop smoking, stop any regular Alcohol  and or any Recreational drug use.  Wear Seat belts while driving.  Please note  You were cared for by a hospitalist during your hospital stay. Once you are discharged, your primary care physician will handle any further medical issues. Please note that NO REFILLS for any discharge medications will be authorized once you are discharged, as it is imperative that you return to your primary care physician (or establish a relationship with a primary care physician if you do not have one) for your aftercare needs so that they can reassess your need for medications  and monitor your lab values.  STROKE/TIA DISCHARGE INSTRUCTIONS SMOKING Cigarette smoking nearly doubles your risk of having a stroke & is the single most alterable risk factor  If you smoke or have smoked in the last 12 months, you are advised to quit smoking for your health.  Most of the excess cardiovascular risk related to smoking disappears within a year of stopping.  Ask you doctor about anti-smoking medications  Register Quit Line: 1-800-QUIT NOW  Free Smoking Cessation Classes [phone]  CHOLESTEROL Know your levels; limit fat & cholesterol in your diet  Lipid Panel     Component Value Date/Time   CHOL 196 05/11/2015 0531   TRIG 154* 05/11/2015 0531   HDL 25* 05/11/2015 0531   CHOLHDL 7.8 05/11/2015 0531   VLDL 31 05/11/2015 0531   LDLCALC 140* 05/11/2015 0531      Many patients benefit from treatment even if their cholesterol is at goal.  Goal: Total Cholesterol (CHOL) less than 160  Goal:  Triglycerides (TRIG) less than 150  Goal:  HDL greater than 40  Goal:  LDL (LDLCALC) less than 100   BLOOD PRESSURE American Stroke Association blood pressure target is less that 120/80 mm/Hg  Your discharge blood pressure is:  BP: (!) 119/56 mmHg  Monitor your blood pressure  Limit your salt and alcohol intake  Many individuals will require more than one medication for high blood pressure  DIABETES (A1c is a blood sugar average for last 3 months) Goal HGBA1c is under 7% (HBGA1c is blood sugar average for last 3 months)  Diabetes: No known diagnosis of diabetes  Lab Results  Component Value Date   HGBA1C 5.8* 05/11/2015     Your HGBA1c can be lowered with medications, healthy diet, and exercise.  Check your blood sugar as directed by your physician  Call your physician if you experience unexplained or low blood sugars.  PHYSICAL ACTIVITY/REHABILITATION Goal is 30 minutes at least 4 days per week  Activity: Increase activity slowly, Therapies: Home health  recommended.    Activity decreases your risk of heart attack and stroke and makes your heart stronger.  It helps control your weight and blood pressure; helps you relax and can improve your mood.  Participate in a regular exercise program.  Talk with your doctor about the best form of exercise for you (dancing, walking, swimming, cycling).  DIET/WEIGHT Goal is to maintain a healthy weight  Your discharge diet is: Diet Heart Room service appropriate?: Yes; Fluid consistency:: Thin Diet - low sodium heart healthy thin liquids Your height is:  Height: 5\' 5"  (165.1 cm) Your current weight is: Weight: 81.647 kg (180 lb) Your Body Mass Index (BMI) is:  BMI (Calculated): 30  Following the type of diet specifically designed for you will help prevent another stroke.  Your goal Body Mass Index (BMI) is 19-24.  Healthy food habits can help reduce 3 risk factors for stroke:  High cholesterol, hypertension, and excess weight.  RESOURCES Stroke/Support Group:  Call [phone]   STROKE EDUCATION PROVIDED/REVIEWED AND GIVEN TO PATIENT Stroke warning signs and symptoms How to activate emergency medical system (call 911). Medications prescribed at discharge. Need for follow-up after discharge. Personal risk factors for stroke. Pneumonia vaccine given: No Flu vaccine given: No My questions have been answered, the writing is legible, and I understand these instructions.  I will adhere to these goals & educational materials that have been provided to me after my discharge from the hospital.         Information on my medicine - XARELTO (Rivaroxaban)   Why was Xarelto prescribed for you? Xarelto was prescribed for you to reduce the risk of a blood clot forming that can cause a stroke if you have a medical condition called atrial fibrillation (a type of irregular heartbeat).  What do you need to know about xarelto ? Take your Xarelto ONCE DAILY at the same time every day with your evening  meal. If you have difficulty swallowing the tablet whole, you may crush it and mix in applesauce just prior to taking your dose.  Take Xarelto exactly as prescribed by your doctor and DO NOT stop taking Xarelto without talking to the doctor who prescribed the medication.  Stopping without other stroke prevention medication to take the place of Xarelto may increase your risk of developing a clot that causes a stroke.  Refill your prescription before you run out.  After discharge, you should have regular check-up appointments with your healthcare provider that is prescribing your Xarelto.  In the future your dose may need to be changed if your kidney function or weight changes by a significant amount.  What do you do if you miss a dose? If you are taking Xarelto ONCE DAILY and you miss a dose, take it as soon as you remember on the same day then continue your regularly scheduled once daily regimen the next day. Do not take two doses of Xarelto at the same time or on the same day.   Important Safety Information A possible side effect of Xarelto is bleeding. You should call your healthcare provider right away  if you experience any of the following: ? Bleeding from an injury or your nose that does not stop. ? Unusual colored urine (red or dark brown) or unusual colored stools (red or black). ? Unusual bruising for unknown reasons. ? A serious fall or if you hit your head (even if there is no bleeding).  Some medicines may interact with Xarelto and might increase your risk of bleeding while on Xarelto. To help avoid this, consult your healthcare provider or pharmacist prior to using any new prescription or non-prescription medications, including herbals, vitamins, non-steroidal anti-inflammatory drugs (NSAIDs) and supplements.  This website has more information on Xarelto: [email or web address]

## 2015-05-14 NOTE — Progress Notes (Signed)
Patient is still having some issues with recall, still waiting on discharge disposition from appeal.

## 2015-05-15 DIAGNOSIS — F419 Anxiety disorder, unspecified: Secondary | ICD-10-CM

## 2015-05-15 NOTE — Progress Notes (Signed)
Notified [facility] of scheduled dc home today with HH. Pt reports she has DME for home.  See previous NCM notes.    Jonnie Finner RN CCM Case Mgmt phone [phone]

## 2015-05-15 NOTE — Progress Notes (Signed)
Pt given discharge instructions and follow-up appointment. Pt denies any complaints or concerns at the time of discharge. Pt has been talked to about discharge instructions very detailed for longer than 10 minutes, pt verbalized understanding discharge instructions.

## 2015-05-15 NOTE — Progress Notes (Signed)
PATIENT DETAILS Name: Tiffany Black Age: 81 y.o. Sex: female Date of Birth: 1934/02/16 Admit Date: 05/10/2015 Admitting Physician Domenic Polite, MD [id number],IONGEXB S, MD  Subjective: No major complaints this am-explained changes to her BP regimen. Ambulated to the bathroom independently this am. She complains of intermittent vertigo-"but has been going on for years"  Assessment/Plan: Principal Problem: Orthostatic hypotension: Improved. Continue labetalol and Diovan.Hold felodipine. Allow mild permissive hypertension given orthostatic hypotension follow BP trend and adjust accordingly.  Active Problems: Acute CVA: Likely an incidental finding.Likely an incidental finding.Carotids did not show any significant stenosis.Echo neg for embolic source. LDL 140 (goal less than 70)-continue statinon discharge , A1c5.8. Continue Xarelto. Non focal on exam at time of discharge (note-spoke with neurology-Dr Xu-personally on 8/31-okay to discharge to further recommendations)  Essential hypertension, benign: Allow permissive hypertension in the setting of orthostatic hypotension and acute CVA. Continue labetalol and Avapro.Explained repeatedly that further optimization can be done in the outpatient setting-she see a "Hypertension MD"in [place] and also has follow up with PCP.   Chronic vertigo:contine Meclizine. Chronic issue. Please note, continues to complain of intermittent vertigo-but per RN the day before while she complained of vertigo-she was able to walk to the bathroom unassisted.She claimed that she can walk to the bathroom without major issues this morning.   Paroxysmal Atrial fibrillation: Sinus rhythm-continue Xarelto.CHA2DS2-VASc Score 6  Dyslipidemia:LDL 140 (goal less than 70)-continue statin. Repeat LDL in 3 months-not sure if patient will continue taking statin in the long-term-she has indicated to me that she likely will not continue taking statin on discharge. I've  explained the importance of continuing to take statin to prevent further CVAs.  Anxiety/Depression:continue Prozac. She is stable-but continues to have some anxiety issues that are CHRONIC. Patient will need follow up with PCP as outpatient  Asymptomatic bacteriuria: No indication for antibiotics. Furthermore-urine culture was negative.  Chronic neck pain: She sees Dr. Shelly Flatten she recently had a MRI of the neck-and was told she needed anti-inflammatory. No deficits on exam. Have explained to her repeatedly, that since this is a chronic issue (per patient going on for years) that this is stable to be addressed in the outpatient setting.  Disposition: Home today  Antimicrobial agents  See below  Anti-infectives    Start     Dose/Rate Route Frequency Ordered Stop   05/10/15 1315  ciprofloxacin (CIPRO) IVPB 400 mg     400 mg 200 mL/hr over 60 Minutes Intravenous  Once 05/10/15 1306 05/10/15 1448      DVT Prophylaxis: Xarelto  Code Status: Full code   Family Communication None at bedside. Patient is awake and alert-and fully understanding of her care  Procedures: None  CONSULTS:  neurology  Time spent 15 minutes-Greater than 50% of this time was spent in counseling, explanation of diagnosis, planning of further management, and coordination of care.  MEDICATIONS: Scheduled Meds: . FLUoxetine  10 mg Oral Daily  . irbesartan  150 mg Oral Daily  . labetalol  200 mg Oral BID  . pravastatin  40 mg Oral q1800  . rivaroxaban  20 mg Oral Q supper   Continuous Infusions:  PRN Meds:.acetaminophen, meclizine, senna-docusate, zolpidem    PHYSICAL EXAM: Vital signs in last 24 hours: Filed Vitals:   05/15/15 0100 05/15/15 0524 05/15/15 0930 05/15/15 0931  BP: 188/81 173/78 140/72 138/68  Pulse: 64 62    Temp: 97.7 F (36.5 C) 98.1 F (36.7 C)  TempSrc: Oral Oral    Resp: 20 18    Height:      Weight:      SpO2: 97% 97%      Weight change:  Filed Weights    05/10/15 1006  Weight: 81.647 kg (180 lb)   Body mass index is 29.95 kg/(m^2).   Gen Exam: Awake and alert with clear speech.   Neck: Supple, No JVD.   Chest: B/L Clear.   CVS: S1 S2 Regular, no murmurs.  Abdomen: soft, BS +, non tender, non distended. Extremities: no edema, lower extremities warm to touch. Neurologic: Non Focal.   Skin: No Rash.   Wounds: N/A.    Intake/Output from previous day:  Intake/Output Summary (Last 24 hours) at 05/15/15 0949 Last data filed at 05/14/15 1820  Gross per 24 hour  Intake    480 ml  Output      0 ml  Net    480 ml     LAB RESULTS: CBC  Recent Labs Lab 05/10/15 1052  WBC 7.4  HGB 14.3  HCT 43.8  PLT 210  MCV 94.2  MCH 30.8  MCHC 32.6  RDW 13.3  LYMPHSABS 1.6  MONOABS 0.6  EOSABS 0.2  BASOSABS 0.0    Chemistries   Recent Labs Lab 05/10/15 1052  NA 140  K 4.4  CL 107  CO2 26  GLUCOSE 98  BUN 11  CREATININE 0.86  CALCIUM 9.4    CBG: No results for input(s): GLUCAP in the last 168 hours.  GFR Estimated Creatinine Clearance: 55.9 mL/min (by C-G formula based on Cr of 0.86).  Coagulation profile No results for input(s): INR, PROTIME in the last 168 hours.  Cardiac Enzymes  Recent Labs Lab 05/10/15 1052  TROPONINI <0.03    Invalid input(s): POCBNP No results for input(s): DDIMER in the last 72 hours. No results for input(s): HGBA1C in the last 72 hours. No results for input(s): CHOL, HDL, LDLCALC, TRIG, CHOLHDL, LDLDIRECT in the last 72 hours. No results for input(s): TSH, T4TOTAL, T3FREE, THYROIDAB in the last 72 hours.  Invalid input(s): FREET3 No results for input(s): VITAMINB12, FOLATE, FERRITIN, TIBC, IRON, RETICCTPCT in the last 72 hours. No results for input(s): LIPASE, AMYLASE in the last 72 hours.  Urine Studies No results for input(s): UHGB, CRYS in the last 72 hours.  Invalid input(s): UACOL, UAPR, USPG, UPH, UTP, UGL, UKET, UBIL, UNIT, UROB, ULEU, UEPI, UWBC, URBC, UBAC, CAST, UCOM,  BILUA  MICROBIOLOGY: Recent Results (from the past 240 hour(s))  Urine culture     Status: None   Collection Time: 05/10/15 11:31 AM  Result Value Ref Range Status   Specimen Description URINE, CLEAN CATCH  Final   Special Requests NONE  Final   Culture NO GROWTH 1 DAY  Final   Report Status 05/11/2015 FINAL  Final    RADIOLOGY STUDIES/RESULTS: Dg Chest 2 View  05/10/2015   CLINICAL DATA:  Dizziness and weakness for 5 days.  EXAM: CHEST  2 VIEW  COMPARISON:  08/24/2014  FINDINGS: Lungs are clear without airspace disease or pulmonary edema. Heart and mediastinum are within normal limits. No large pleural effusions. Degenerative changes in the thoracic spine.  IMPRESSION: No active cardiopulmonary disease.   Electronically Signed   By: Markus Daft M.D.   On: 05/10/2015 11:05   [name] Contrast  05/10/2015   CLINICAL DATA:  81 year old female with history of vertigo since 2014. Medication not helping. Dizziness on motion. History of hypertension, atrial  fibrillation, endometrial carcinoma and hyperlipidemia. Initial encounter.  EXAM: MRI HEAD WITHOUT CONTRAST  TECHNIQUE: Multiplanar, multiecho pulse sequences of the brain and surrounding structures were obtained without intravenous contrast.  COMPARISON:  02/27/2013.  FINDINGS: Exam is motion degraded.  Tiny acute nonhemorrhagic posterior right temporal lobe infarct.  Remote left frontal lobe infarcts with wallerian degeneration and left cerebral peduncle mineral deposition unchanged.  Tiny blood breakdown products right middle cerebral peduncle and left frontal lobe consistent with prior hemorrhagic ischemia without acute intracranial hemorrhage noted.  Small vessel disease type changes most notable periventricular region.  Mild atrophy without hydrocephalus.  No intracranial mass lesion noted on this unenhanced exam.  Major intracranial vascular structures are patent.  Mild transverse ligament hypertrophy. Facet joint degenerative changes left  aspect upper cervical spine.  Cervical medullary junction and pineal region unremarkable.  Partially empty non expanded sella felt to be an incidental finding.  Post lens replacement. Slight prominence peri optic spaces, within normal limits.  Paranasal sinus mucosal thickening most notable left maxillary sinus.  IMPRESSION: Exam is motion degraded.  Tiny acute nonhemorrhagic posterior right temporal lobe infarct.  Remote left frontal lobe infarcts with wallerian degeneration unchanged.  Small vessel disease type changes most notable periventricular region.  Mild atrophy without hydrocephalus.  Paranasal sinus mucosal thickening most notable left maxillary sinus.   Electronically Signed   By: Genia Del M.D.   On: 05/10/2015 12:52   Mr Jodene Nam Head/brain Wo Cm  05/11/2015   CLINICAL DATA:  Intermittent dizziness, fluctuating blood pressure. History of hypertension and vertigo, atrial fibrillation, dyslipidemia.  EXAM: MRA HEAD WITHOUT CONTRAST  TECHNIQUE: Angiographic images of the Circle of Willis were obtained using MRA technique without intravenous contrast.  COMPARISON:  MRI of the brain May 10, 2015  FINDINGS: Anterior circulation: Normal flow related enhancement of the included cervical, petrous, cavernous and supra clinoid internal carotid arteries. Patent anterior communicating artery. Normal flow related enhancement of the anterior and middle cerebral arteries, including more distal segments. Focal signal loss within the distal LEFT middle cerebral artery segments corresponds to tortuosity on raw data.  No large vessel occlusion, high-grade stenosis, aneurysm.  Posterior circulation: RIGHT vertebral artery is dominant. Basilar artery is patent, with normal flow related enhancement of the main branch vessels. RIGHT posterior cerebral artery occlusion at distal RIGHT P1 segment. Moderate luminal irregularity LEFT posterior cerebral artery.  No high-grade stenosis, aneurysm.  IMPRESSION: Occluded RIGHT  posterior cerebral artery at the level of P1, possibly acute.  Moderate luminal irregularity of LEFT posterior cerebral artery compatible with atherosclerosis.  Focal high-grade stenosis distal LEFT MCA segment, which may be artifact.   Electronically Signed   By: Elon Alas M.D.   On: 05/11/2015 03:17    Oren Binet, MD  [facility] Pager:[phone]  If 7PM-7AM, please contact night-coverage [email or web address] Password [email or web address] 05/15/2015, 9:49 AM   LOS: 5 days

## 2015-05-22 ENCOUNTER — Encounter (HOSPITAL_COMMUNITY): Payer: Self-pay | Admitting: *Deleted

## 2015-05-22 ENCOUNTER — Observation Stay (HOSPITAL_COMMUNITY)
Admission: EM | Admit: 2015-05-22 | Discharge: 2015-05-24 | Disposition: A | Discharge status: Home / Self Care | Payer: Medicare Other | Attending: Internal Medicine | Admitting: Internal Medicine

## 2015-05-22 ENCOUNTER — Emergency Department (HOSPITAL_COMMUNITY): Payer: Medicare Other

## 2015-05-22 DIAGNOSIS — I160 Hypertensive urgency: Secondary | ICD-10-CM | POA: Diagnosis present

## 2015-05-22 DIAGNOSIS — F419 Anxiety disorder, unspecified: Secondary | ICD-10-CM | POA: Diagnosis not present

## 2015-05-22 DIAGNOSIS — I480 Paroxysmal atrial fibrillation: Secondary | ICD-10-CM | POA: Insufficient documentation

## 2015-05-22 DIAGNOSIS — E785 Hyperlipidemia, unspecified: Secondary | ICD-10-CM | POA: Diagnosis not present

## 2015-05-22 DIAGNOSIS — Z880 Allergy status to penicillin: Secondary | ICD-10-CM | POA: Insufficient documentation

## 2015-05-22 DIAGNOSIS — I10 Essential (primary) hypertension: Secondary | ICD-10-CM | POA: Diagnosis present

## 2015-05-22 DIAGNOSIS — Z8542 Personal history of malignant neoplasm of other parts of uterus: Secondary | ICD-10-CM | POA: Diagnosis not present

## 2015-05-22 DIAGNOSIS — Z7901 Long term (current) use of anticoagulants: Secondary | ICD-10-CM | POA: Diagnosis not present

## 2015-05-22 DIAGNOSIS — R0789 Other chest pain: Principal | ICD-10-CM | POA: Insufficient documentation

## 2015-05-22 DIAGNOSIS — I4819 Other persistent atrial fibrillation: Secondary | ICD-10-CM | POA: Diagnosis present

## 2015-05-22 DIAGNOSIS — Z8673 Personal history of transient ischemic attack (TIA), and cerebral infarction without residual deficits: Secondary | ICD-10-CM | POA: Diagnosis not present

## 2015-05-22 DIAGNOSIS — R079 Chest pain, unspecified: Secondary | ICD-10-CM | POA: Diagnosis present

## 2015-05-22 LAB — CBC WITH DIFFERENTIAL/PLATELET
BASOS PCT: 1 % (ref 0–1)
Basophils Absolute: 0 10*3/uL (ref 0.0–0.1)
Eosinophils Absolute: 0.2 10*3/uL (ref 0.0–0.7)
Eosinophils Relative: 4 % (ref 0–5)
HEMATOCRIT: 39.5 % (ref 36.0–46.0)
HEMOGLOBIN: 12.7 g/dL (ref 12.0–15.0)
LYMPHS ABS: 1.6 10*3/uL (ref 0.7–4.0)
Lymphocytes Relative: 27 % (ref 12–46)
MCH: 29.9 pg (ref 26.0–34.0)
MCHC: 32.2 g/dL (ref 30.0–36.0)
MCV: 92.9 fL (ref 78.0–100.0)
MONOS PCT: 7 % (ref 3–12)
Monocytes Absolute: 0.5 10*3/uL (ref 0.1–1.0)
NEUTROS ABS: 3.8 10*3/uL (ref 1.7–7.7)
NEUTROS PCT: 61 % (ref 43–77)
Platelets: 182 10*3/uL (ref 150–400)
RBC: 4.25 MIL/uL (ref 3.87–5.11)
RDW: 13.3 % (ref 11.5–15.5)
WBC: 6.1 10*3/uL (ref 4.0–10.5)

## 2015-05-22 LAB — URINALYSIS, ROUTINE W REFLEX MICROSCOPIC
BILIRUBIN URINE: NEGATIVE
GLUCOSE, UA: NEGATIVE mg/dL
KETONES UR: NEGATIVE mg/dL
NITRITE: NEGATIVE
PH: 6 (ref 5.0–8.0)
Protein, ur: NEGATIVE mg/dL
Specific Gravity, Urine: 1.008 (ref 1.005–1.030)
Urobilinogen, UA: 0.2 mg/dL (ref 0.0–1.0)

## 2015-05-22 LAB — COMPREHENSIVE METABOLIC PANEL
ALBUMIN: 3.7 g/dL (ref 3.5–5.0)
ALK PHOS: 55 U/L (ref 38–126)
ALT: 14 U/L (ref 14–54)
ANION GAP: 10 (ref 5–15)
AST: 20 U/L (ref 15–41)
BILIRUBIN TOTAL: 0.4 mg/dL (ref 0.3–1.2)
BUN: 11 mg/dL (ref 6–20)
CALCIUM: 9 mg/dL (ref 8.9–10.3)
CO2: 24 mmol/L (ref 22–32)
Chloride: 102 mmol/L (ref 101–111)
Creatinine, Ser: 0.77 mg/dL (ref 0.44–1.00)
GFR calc Af Amer: >60 mL/min (ref >60)
GFR calc non Af Amer: >60 mL/min (ref >60)
GLUCOSE: 104 mg/dL — AB (ref 65–99)
Potassium: 4.1 mmol/L (ref 3.5–5.1)
Sodium: 136 mmol/L (ref 135–145)
TOTAL PROTEIN: 6 g/dL — AB (ref 6.5–8.1)

## 2015-05-22 LAB — URINE MICROSCOPIC-ADD ON

## 2015-05-22 LAB — I-STAT TROPONIN, ED: Troponin i, poc: 0 ng/mL (ref 0.00–0.08)

## 2015-05-22 MED ORDER — ACETAMINOPHEN 325 MG PO TABS
650.0000 mg | ORAL_TABLET | Freq: Once | ORAL | Status: AC
  Administered 2015-05-22: 650 mg via ORAL
  Filled 2015-05-22: qty 2

## 2015-05-22 MED ORDER — HYDRALAZINE HCL 10 MG PO TABS
10.0000 mg | ORAL_TABLET | Freq: Once | ORAL | Status: AC
  Administered 2015-05-22: 10 mg via ORAL
  Filled 2015-05-22: qty 1

## 2015-05-22 MED ORDER — HYDRALAZINE HCL 20 MG/ML IJ SOLN: 5.0000 mg | Freq: Once | INTRAMUSCULAR | Status: DC

## 2015-05-22 NOTE — ED Notes (Signed)
Pt is refusing all orders, X-ray, CT, IV and medication until she talks to the Dr., provider notified.

## 2015-05-22 NOTE — ED Notes (Signed)
Pt arrives from home via [facility]. Pt states she has been closely monitoring her BP since her stroke last week. Pt is also concerned about the BP meds she is receiving isn't bringing down her BP.

## 2015-05-22 NOTE — ED Notes (Signed)
Contacted CT and informed pt was now consenting to go to CT

## 2015-05-22 NOTE — ED Provider Notes (Signed)
CSN: [id number]     Arrival date & time 05/22/15  1756 History   First MD Initiated Contact with Patient 05/22/15 1811     Chief Complaint  Patient presents with  . Hypertension   HPI  Tiffany Black is a 81 year old female with PMHx of HTN, HLD and stroke presenting with with hypertension. Pt states she was recently discharged from [facility] after an acute stroke with no residual deficits. Pt has been following her blood pressure readings at home and noted them to be in the high 366Q systolic and 947M diastolic. She reports that she has been taking her blood pressure medicine as prescribed and last took her scheduled medicines at 7 PM. Denies fevers, headache, changes in vision, eye pain, chest pain, SOB, abdominal pain, nausea, vomiting, syncope, weakness, numbness or tingling. Pt reports chronic left sided ear and neck pain.   Past Medical History  Diagnosis Date  . Hypertension   . Arthritis   . 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 Endometrial ca   . Anxiety   . HLD (hyperlipidemia)   . Cardiomegaly   . Pectus excavatum   . OA (osteoarthritis)   . Vertigo   . Hx of echocardiogram     a. Echo (11/15): Mild LVH, EF 55-60%, normal wall motion, grade 2 diastolic dysfunction, mild LAE, normal RV function, PASP 29 mm Hg   Past Surgical History  Procedure Laterality Date  . Cesarean section  1975  . Laparoscopic hysterectomy  2011  . Cataract extraction, bilateral Bilateral 2009  . Abdominal hysterectomy  2011   Family History  Problem Relation Age of Onset  . Heart attack Father   . Heart disease Father    Social History  Substance Use Topics  . Smoking status: Never Smoker   . Smokeless tobacco: Never Used  . Alcohol Use: No   OB History    No data available     Review of Systems  Constitutional: Negative for fever and chills.  HENT: Positive for ear pain.    Eyes: Negative for pain and visual disturbance.  Respiratory: Negative for shortness of breath.   Cardiovascular: Negative for chest pain.  Gastrointestinal: Negative for nausea, vomiting and abdominal pain.  Musculoskeletal: Positive for neck pain.  Neurological: Negative for dizziness, syncope, weakness, light-headedness, numbness and headaches.      Allergies  Latex; Carbamazepine; Codeine; Hydrochlorothiazide; Penicillins; and Sulfonamide derivatives  Home Medications   Prior to Admission medications   Medication Sig Start Date End Date Taking? Authorizing Provider  acetaminophen (TYLENOL) 325 MG tablet Take 650 mg by mouth at bedtime as needed for headache (pain).    Yes Historical Provider, MD  cholecalciferol (VITAMIN D) 1000 UNITS tablet Take 1,000 Units by mouth daily after lunch.    Yes Historical Provider, MD  DIOVAN 160 MG tablet Take 1 tablet (160 mg total) by mouth 2 (two) times daily. 05/14/15  Yes Shanker Kristeen Mans, MD  FLUoxetine (PROZAC) 10 MG tablet Take 10 mg by mouth daily after lunch.    Yes Historical Provider, MD  hydrocortisone (PROCTOSOL HC) 2.5 % rectal cream Place 1 application rectally daily as needed for hemorrhoids or itching.   Yes Historical Provider, MD  labetalol (NORMODYNE) 200 MG tablet Take 200 mg by mouth See admin instructions. Take 1 tablet (200 mg) by mouth every morning and 1 tablet (200 mg)  after supper, may take an extra 1/2 tablet (100 mg) by mouth if BP will not come down.   Yes Historical Provider, MD  meclizine (ANTIVERT) 25 MG tablet Take 25 mg by mouth daily as needed for dizziness.    Yes Historical Provider, MD  Menthol, Topical Analgesic, (BENGAY EX) Apply 1 application topically at bedtime as needed (neck pain).   Yes Historical Provider, MD  metoprolol tartrate (LOPRESSOR) 25 MG tablet Take 1 tablet (25 mg total) by mouth once as needed. For tachypalpitations Patient taking differently: Take 25 mg by mouth daily as needed  (tachypalpitations).  11/09/14  Yes Deboraha Sprang, MD  potassium chloride SA (K-DUR,KLOR-CON) 20 MEQ tablet Take 10 mEq by mouth daily after lunch.   Yes Historical Provider, MD  XARELTO 20 MG TABS tablet TAKE ONE TABLET BY MOUTH ONCE DAILY Patient taking differently: TAKE ONE TABLET BY MOUTH ONCE DAILY WITH SUPPER 04/21/15  Yes Deboraha Sprang, MD  zolpidem (AMBIEN) 10 MG tablet Take 5 mg by mouth at bedtime. 05/03/15  Yes Historical Provider, MD  pravastatin (PRAVACHOL) 40 MG tablet Take 1 tablet (40 mg total) by mouth daily at 6 PM. Patient not taking: Reported on 05/22/2015 05/14/15   Jonetta Osgood, MD   BP 189/82 mmHg  Pulse 59  Temp(Src) 98.5 F (36.9 C) (Oral)  Resp 15  SpO2 99% Physical Exam  Constitutional: She is oriented to person, place, and time. She appears well-developed and well-nourished. No distress.  HENT:  Head: Normocephalic and atraumatic.  Eyes: Conjunctivae and EOM are normal. Pupils are equal, round, and reactive to light.  Neck: Normal range of motion.  Cardiovascular: Normal rate, regular rhythm and normal heart sounds.   Pedal pulses palpable bilaterally  Pulmonary/Chest: Effort normal and breath sounds normal. She has no wheezes. She has no rales.  Abdominal: Soft. There is no tenderness.  Musculoskeletal: Normal range of motion.  Pt moving extremities spontaneously. Walking with a steady gait  Neurological: She is alert and oriented to person, place, and time. No cranial nerve deficit.  5/5 motor strength of upper and lower extremities. Sensation to light touch intact throughout. Walking with a steady gait  Skin: Skin is warm and dry.  Psychiatric: She has a normal mood and affect. Her behavior is normal.  Nursing note and vitals reviewed.   ED Course  Procedures (including critical care time) Labs Review Labs Reviewed  COMPREHENSIVE METABOLIC PANEL - Abnormal; Notable for the following:    Glucose, Bld 104 (*)    Total Protein 6.0 (*)    All other  components within normal limits  URINALYSIS, ROUTINE W REFLEX MICROSCOPIC (NOT AT [facility]) - Abnormal; Notable for the following:    Hgb urine dipstick TRACE (*)    Leukocytes, UA MODERATE (*)    All other components within normal limits  URINE MICROSCOPIC-ADD ON - Abnormal; Notable for the following:    Squamous Epithelial / LPF MANY (*)    Bacteria, UA FEW (*)    All other components within normal limits  URINE CULTURE  CBC WITH DIFFERENTIAL/PLATELET  I-STAT TROPOININ, ED    Imaging Review Ct Head Wo Contrast  05/22/2015   CLINICAL DATA:  Altered mental status with dizziness. Left-sided tightness. Hypertension.  EXAM: CT HEAD WITHOUT CONTRAST  TECHNIQUE: Contiguous axial images were obtained from the base of the skull through the vertex without intravenous contrast.  COMPARISON:  MRI brain 05/10/2015.  CT head 02/28/2013  FINDINGS: Diffuse cerebral atrophy. Mild ventricular dilatation consistent  with central atrophy. Cavum septum pellucidum. Patchy low-attenuation changes in the deep white matter consistent with small vessel ischemia. Old infarct in the left internal capsule and left anterior frontal region. No significant change since prior study. No mass effect or midline shift. No abnormal extra-axial fluid collections. Gray-white matter junctions are distinct. Basal cisterns are not effaced. No evidence of acute intracranial hemorrhage. No depressed skull fractures. Mucosal thickening in the paranasal sinuses most prominent in the left maxillary antrum. Mastoid air cells are not opacified.  IMPRESSION: No acute intracranial abnormalities. Chronic atrophy and small vessel ischemic changes with old focal infarcts. No acute change since previous study.   Electronically Signed   By: Lucienne Capers M.D.   On: 05/22/2015 22:58   I have personally reviewed and evaluated these images and lab results as part of my medical decision-making.   EKG Interpretation   Date/Time:  Saturday May 22 2015 17:57:31 EDT Ventricular Rate:  60 PR Interval:  171 QRS Duration: 92 QT Interval:  442 QTC Calculation: 442 R Axis:   -47 Text Interpretation:  Sinus rhythm Left anterior fascicular block Abnormal  R-wave progression, early transition LVH with secondary repolarization  abnormality Confirmed by Hazle Coca [id number]) on 05/22/2015 6:18:43 PM      MDM   Final diagnoses:  None   Pt presenting with high blood pressure readings at home. Pt 213/93 in triage. Denies headaches, vision changes, chest pain, SOB, abdominal pain, nausea or vomiting. Pt has chronic ear and neck pain that she has been worked up for in the past. Pt brings up her neck pain frequently during the interview. No changes to this pain and pt states she has had recent MRI of neck but with no answers. Denies any new symptoms at this time. BP checked while interviewing pt and found to be 198/95. Heart RRR. Lungs CTAB. Abdomen soft, non-tender. Non-focal neuro exam. UA suggestive of UTI. Dr. Ralene Bathe in to see patient and now complaining of left sided "tightness". Will get troponin and head CT. Pt signed out to Dr. Ralene Bathe at shift change for further evaluation of her new complaint.   Lahoma Crocker Sandy Haye, PA-C 05/22/15 [place], MD 05/23/15 [phone]

## 2015-05-22 NOTE — ED Notes (Signed)
Patient refused go to x-ray and ct until she get a chance to talk to the Dr.

## 2015-05-22 NOTE — Discharge Instructions (Signed)

## 2015-05-23 ENCOUNTER — Encounter (HOSPITAL_COMMUNITY): Payer: Self-pay | Admitting: Internal Medicine

## 2015-05-23 DIAGNOSIS — R0789 Other chest pain: Secondary | ICD-10-CM

## 2015-05-23 DIAGNOSIS — I480 Paroxysmal atrial fibrillation: Secondary | ICD-10-CM

## 2015-05-23 DIAGNOSIS — I10 Essential (primary) hypertension: Secondary | ICD-10-CM | POA: Insufficient documentation

## 2015-05-23 DIAGNOSIS — Z8542 Personal history of malignant neoplasm of other parts of uterus: Secondary | ICD-10-CM | POA: Diagnosis not present

## 2015-05-23 DIAGNOSIS — R079 Chest pain, unspecified: Secondary | ICD-10-CM | POA: Diagnosis present

## 2015-05-23 DIAGNOSIS — E785 Hyperlipidemia, unspecified: Secondary | ICD-10-CM | POA: Diagnosis not present

## 2015-05-23 DIAGNOSIS — I160 Hypertensive urgency: Secondary | ICD-10-CM | POA: Diagnosis present

## 2015-05-23 LAB — COMPREHENSIVE METABOLIC PANEL
ALBUMIN: 3.7 g/dL (ref 3.5–5.0)
ALK PHOS: 57 U/L (ref 38–126)
ALT: 14 U/L (ref 14–54)
ANION GAP: 8 (ref 5–15)
AST: 20 U/L (ref 15–41)
BILIRUBIN TOTAL: 0.7 mg/dL (ref 0.3–1.2)
BUN: 8 mg/dL (ref 6–20)
CALCIUM: 9.2 mg/dL (ref 8.9–10.3)
CO2: 25 mmol/L (ref 22–32)
CREATININE: 0.75 mg/dL (ref 0.44–1.00)
Chloride: 106 mmol/L (ref 101–111)
GFR calc Af Amer: >60 mL/min (ref >60)
GFR calc non Af Amer: >60 mL/min (ref >60)
GLUCOSE: 103 mg/dL — AB (ref 65–99)
Potassium: 3.6 mmol/L (ref 3.5–5.1)
SODIUM: 139 mmol/L (ref 135–145)
TOTAL PROTEIN: 6.3 g/dL — AB (ref 6.5–8.1)

## 2015-05-23 LAB — CBC
HCT: 39.9 % (ref 36.0–46.0)
HEMOGLOBIN: 13.2 g/dL (ref 12.0–15.0)
MCH: 30.3 pg (ref 26.0–34.0)
MCHC: 33.1 g/dL (ref 30.0–36.0)
MCV: 91.5 fL (ref 78.0–100.0)
Platelets: 183 10*3/uL (ref 150–400)
RBC: 4.36 MIL/uL (ref 3.87–5.11)
RDW: 13.1 % (ref 11.5–15.5)
WBC: 6.6 10*3/uL (ref 4.0–10.5)

## 2015-05-23 LAB — TROPONIN I
Troponin I: <0.03 ng/mL (ref <0.031)
Troponin I: <0.03 ng/mL (ref <0.031)

## 2015-05-23 LAB — PHOSPHORUS: Phosphorus: 3 mg/dL (ref 2.5–4.6)

## 2015-05-23 LAB — MAGNESIUM: Magnesium: 2 mg/dL (ref 1.7–2.4)

## 2015-05-23 LAB — TSH: TSH: 4.811 u[IU]/mL — ABNORMAL HIGH (ref 0.350–4.500)

## 2015-05-23 MED ORDER — FELODIPINE ER 2.5 MG PO TB24
2.5000 mg | ORAL_TABLET | Freq: Every day | ORAL | Status: DC
  Administered 2015-05-23: 2.5 mg via ORAL
  Filled 2015-05-23 (×2): qty 1

## 2015-05-23 MED ORDER — SODIUM CHLORIDE 0.9 % IJ SOLN
3.0000 mL | Freq: Two times a day (BID) | INTRAMUSCULAR | Status: DC
  Administered 2015-05-23 (×2): 3 mL via INTRAVENOUS

## 2015-05-23 MED ORDER — ONDANSETRON HCL 4 MG PO TABS: 4.0000 mg | ORAL_TABLET | Freq: Four times a day (QID) | ORAL | Status: DC | PRN

## 2015-05-23 MED ORDER — PRAVASTATIN SODIUM 40 MG PO TABS
40.0000 mg | ORAL_TABLET | Freq: Every day | ORAL | Status: DC
  Filled 2015-05-23: qty 1

## 2015-05-23 MED ORDER — SENNA 8.6 MG PO TABS
1.0000 | ORAL_TABLET | Freq: Two times a day (BID) | ORAL | Status: DC
  Filled 2015-05-23: qty 1

## 2015-05-23 MED ORDER — ZOLPIDEM TARTRATE 5 MG PO TABS
5.0000 mg | ORAL_TABLET | Freq: Every day | ORAL | Status: DC
  Administered 2015-05-23: 5 mg via ORAL
  Filled 2015-05-23: qty 1

## 2015-05-23 MED ORDER — HYDRALAZINE HCL 20 MG/ML IJ SOLN
5.0000 mg | Freq: Once | INTRAMUSCULAR | Status: AC
  Administered 2015-05-23: 5 mg via INTRAVENOUS
  Filled 2015-05-23: qty 1

## 2015-05-23 MED ORDER — ACETAMINOPHEN 650 MG RE SUPP: 650.0000 mg | Freq: Four times a day (QID) | RECTAL | Status: DC | PRN

## 2015-05-23 MED ORDER — FLUOXETINE HCL 10 MG PO CAPS
10.0000 mg | ORAL_CAPSULE | Freq: Every day | ORAL | Status: DC
  Administered 2015-05-23 – 2015-05-24 (×2): 10 mg via ORAL
  Filled 2015-05-23 (×2): qty 1

## 2015-05-23 MED ORDER — ASPIRIN EC 81 MG PO TBEC
81.0000 mg | DELAYED_RELEASE_TABLET | Freq: Every day | ORAL | Status: DC
  Administered 2015-05-23 – 2015-05-24 (×2): 81 mg via ORAL
  Filled 2015-05-23 (×2): qty 1

## 2015-05-23 MED ORDER — HYDROCORTISONE 2.5 % RE CREA: 1.0000 "application " | TOPICAL_CREAM | Freq: Every day | RECTAL | Status: DC | PRN

## 2015-05-23 MED ORDER — HYDROCODONE-ACETAMINOPHEN 5-325 MG PO TABS: 1.0000 | ORAL_TABLET | ORAL | Status: DC | PRN

## 2015-05-23 MED ORDER — ACETAMINOPHEN 325 MG PO TABS
650.0000 mg | ORAL_TABLET | Freq: Four times a day (QID) | ORAL | Status: DC | PRN
  Administered 2015-05-23 (×2): 650 mg via ORAL
  Filled 2015-05-23 (×2): qty 2

## 2015-05-23 MED ORDER — IRBESARTAN 300 MG PO TABS
150.0000 mg | ORAL_TABLET | Freq: Two times a day (BID) | ORAL | Status: DC
  Administered 2015-05-23 – 2015-05-24 (×3): 150 mg via ORAL
  Filled 2015-05-23: qty 1
  Filled 2015-05-23: qty 0.5
  Filled 2015-05-23: qty 1

## 2015-05-23 MED ORDER — SODIUM CHLORIDE 0.9 % IJ SOLN: 3.0000 mL | INTRAMUSCULAR | Status: DC | PRN

## 2015-05-23 MED ORDER — RIVAROXABAN 20 MG PO TABS
20.0000 mg | ORAL_TABLET | Freq: Every day | ORAL | Status: DC
  Administered 2015-05-23: 20 mg via ORAL
  Filled 2015-05-23 (×2): qty 1

## 2015-05-23 MED ORDER — SODIUM CHLORIDE 0.9 % IV SOLN: 250.0000 mL | INTRAVENOUS | Status: DC | PRN

## 2015-05-23 MED ORDER — ONDANSETRON HCL 4 MG/2ML IJ SOLN: 4.0000 mg | Freq: Four times a day (QID) | INTRAMUSCULAR | Status: DC | PRN

## 2015-05-23 MED ORDER — POTASSIUM CHLORIDE CRYS ER 10 MEQ PO TBCR
10.0000 meq | EXTENDED_RELEASE_TABLET | Freq: Every day | ORAL | Status: DC
  Administered 2015-05-23 – 2015-05-24 (×2): 10 meq via ORAL
  Filled 2015-05-23 (×2): qty 1

## 2015-05-23 MED ORDER — HYDRALAZINE HCL 20 MG/ML IJ SOLN: 10.0000 mg | INTRAMUSCULAR | Status: DC | PRN

## 2015-05-23 MED ORDER — LABETALOL HCL 200 MG PO TABS
200.0000 mg | ORAL_TABLET | Freq: Two times a day (BID) | ORAL | Status: DC
  Administered 2015-05-23 – 2015-05-24 (×3): 200 mg via ORAL
  Filled 2015-05-23 (×3): qty 1

## 2015-05-23 MED ORDER — DOCUSATE SODIUM 100 MG PO CAPS
100.0000 mg | ORAL_CAPSULE | Freq: Two times a day (BID) | ORAL | Status: DC
  Filled 2015-05-23: qty 1

## 2015-05-23 MED ORDER — MECLIZINE HCL 25 MG PO TABS: 25.0000 mg | ORAL_TABLET | Freq: Every day | ORAL | Status: DC | PRN

## 2015-05-23 MED ORDER — IRBESARTAN 150 MG PO TABS
150.0000 mg | ORAL_TABLET | Freq: Every day | ORAL | Status: DC
  Filled 2015-05-23: qty 1

## 2015-05-23 NOTE — Progress Notes (Signed)
[facility] PROGRESS NOTE  Tiffany Black XIP:[id number] DOB: 01/28/1934 DOA: 05/22/2015  PCP: [name], MD  Brief HPI: 81 year old Caucasian female with a past medical history of paroxysmal atrial fibrillation, hyperlipidemia, osteoarthritis, hypertension, was recently discharged from the hospital after being managed for an acute stroke. She was noted to have elevated blood pressures at that time. She presented back to the emergency department with complaints of elevated blood pressure and chest pains. She was hospitalized for further management.  Past medical history:  Past Medical History  Diagnosis Date  . Hypertension   . Arthritis   . 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 Endometrial ca   . Anxiety   . HLD (hyperlipidemia)   . Cardiomegaly   . Pectus excavatum   . OA (osteoarthritis)   . Vertigo   . Hx of echocardiogram     a. Echo (11/15): Mild LVH, EF 55-60%, normal wall motion, grade 2 diastolic dysfunction, mild LAE, normal RV function, PASP 29 mm Hg    Consultants: None  Procedures: None  Antibiotics: None  Subjective: Patient complains of left-sided neck pain radiating to her head. No recent falls or injuries. She is mainly concerned about her high blood pressures. No chest pain was mentioned this morning.  Objective: Vital Signs  Filed Vitals:   05/23/15 0300 05/23/15 0315 05/23/15 0328 05/23/15 0348  BP: 200/92 155/141 186/93 174/85  Pulse: 58 57  68  Temp:    97.9 F (36.6 C)  TempSrc:    Oral  Resp: 18 17  18   Height:    5\' 5"  (1.651 m)  Weight:    79.652 kg (175 lb 9.6 oz)  SpO2: 97% 98%  98%    Intake/Output Summary (Last 24 hours) at 05/23/15 0957 Last data filed at 05/23/15 0820  Gross per 24 hour  Intake    480 ml  Output    400 ml  Net     80 ml   Filed Weights   05/23/15 0348  Weight: 79.652 kg (175  lb 9.6 oz)    General appearance: alert, cooperative, appears stated age and no distress. Good range of motion of the neck is noted. Resp: clear to auscultation bilaterally Cardio: regular rate and rhythm, S1, S2 normal, no murmur, click, rub or gallop GI: soft, non-tender; bowel sounds normal; no masses,  no organomegaly Extremities: extremities normal, atraumatic, no cyanosis or edema Neurologic: No facial asymmetry. Moving all her extremities without difficulty.  Lab Results:  Basic Metabolic Panel:  Recent Labs Lab 05/22/15 1902 05/23/15 0436  NA 136 139  K 4.1 3.6  CL 102 106  CO2 24 25  GLUCOSE 104* 103*  BUN 11 8  CREATININE 0.77 0.75  CALCIUM 9.0 9.2  MG  --  2.0  PHOS  --  3.0   Liver Function Tests:  Recent Labs Lab 05/22/15 1902 05/23/15 0436  AST 20 20  ALT 14 14  ALKPHOS 55 57  BILITOT 0.4 0.7  PROT 6.0* 6.3*  ALBUMIN 3.7 3.7   CBC:  Recent Labs Lab 05/22/15 1902 05/23/15 0436  WBC 6.1 6.6  NEUTROABS 3.8  --   HGB 12.7 13.2  HCT 39.5 39.9  MCV 92.9 91.5  PLT 182 183   Cardiac Enzymes:  Recent Labs Lab 05/23/15 0436  TROPONINI <0.03    Recent Results (from the past 240 hour(s))  Urine culture     Status: None (Preliminary result)   Collection Time: 05/22/15  7:50 PM  Result Value Ref Range Status   Specimen Description URINE, RANDOM  Final   Special Requests ADDED 2224  Final   Culture NO GROWTH < 12 HOURS  Final   Report Status PENDING  Incomplete      Studies/Results: Ct Head Wo Contrast  05/22/2015   CLINICAL DATA:  Altered mental status with dizziness. Left-sided tightness. Hypertension.  EXAM: CT HEAD WITHOUT CONTRAST  TECHNIQUE: Contiguous axial images were obtained from the base of the skull through the vertex without intravenous contrast.  COMPARISON:  MRI brain 05/10/2015.  CT head 02/28/2013  FINDINGS: Diffuse cerebral atrophy. Mild ventricular dilatation consistent with central atrophy. Cavum septum pellucidum. Patchy  low-attenuation changes in the deep white matter consistent with small vessel ischemia. Old infarct in the left internal capsule and left anterior frontal region. No significant change since prior study. No mass effect or midline shift. No abnormal extra-axial fluid collections. Gray-white matter junctions are distinct. Basal cisterns are not effaced. No evidence of acute intracranial hemorrhage. No depressed skull fractures. Mucosal thickening in the paranasal sinuses most prominent in the left maxillary antrum. Mastoid air cells are not opacified.  IMPRESSION: No acute intracranial abnormalities. Chronic atrophy and small vessel ischemic changes with old focal infarcts. No acute change since previous study.   Electronically Signed   By: Lucienne Capers M.D.   On: 05/22/2015 22:58    Medications:  Scheduled: . aspirin EC  81 mg Oral Daily  . docusate sodium  100 mg Oral BID  . felodipine  2.5 mg Oral Daily  . FLUoxetine  10 mg Oral QPC lunch  . irbesartan  150 mg Oral BID  . labetalol  200 mg Oral BID  . potassium chloride SA  10 mEq Oral QPC lunch  . pravastatin  40 mg Oral q1800  . rivaroxaban  20 mg Oral Daily  . senna  1 tablet Oral BID  . sodium chloride  3 mL Intravenous Q12H  . sodium chloride  3 mL Intravenous Q12H  . zolpidem  5 mg Oral QHS   Continuous:  AJO:INOMVE chloride, acetaminophen **OR** acetaminophen, hydrALAZINE, HYDROcodone-acetaminophen, hydrocortisone, meclizine, ondansetron **OR** ondansetron (ZOFRAN) IV, sodium chloride  Assessment/Plan:  Active Problems:   Essential hypertension, benign   Atrial fibrillation   Hypertensive urgency   Chest pain    Accelerated hypertension Patient is quite concerned about her poorly controlled blood pressure. She denies any symptoms of orthostatic hypotension at home. This was the main reason that her felodipine was held during previous hospitalization. We will reinitiate this medication. Monitor closely for 1 more day. Have  her seen by PT and OT.  Left-sided neck pain No recent falls or injuries. No neurological deficits noted. She had MRI of her cervical spine in 2010 which revealed a lot of degenerative changes with facet arthropathy on multiple levels, mainly on the left. This is the most likely reason for her pain. Pain management for now. PT and OT to evaluate.  History of atrial fibrillation Continue her beta blocker. Continue oral anticoagulation.  Recent stroke Stable. No new neurological deficits. Continue to monitor  Abnormal UA Many squamous epithelial cells noted. This is likely a contaminated sample. Patient denies any dysuria. No need for treatment.  Atypical chest pain No pain mentioned this morning. Cardiac enzymes are negative. EKG was nonischemic. Continue to monitor  DVT Prophylaxis: On oral anticoagulations    Code Status:  Full code  Family Communication: Discussed with the patient  Disposition Plan: Await improvement in blood pressure. Await PT and OT evaluation. Patient wants to talk to social worker regarding paying out of pocket for SNF      [facility] Pager [phone] 05/23/2015, 9:57 AM  If 7PM-7AM, please contact night-coverage at [email or web address], password [facility]

## 2015-05-23 NOTE — Progress Notes (Signed)
Pt complains of persistent left side neck pain that radiates to the head and face. Patient talked with the MD this morning and he is aware.

## 2015-05-23 NOTE — Progress Notes (Signed)
Chaplain responded to request by patient for visit by chaplain. Patient's husband died in 22 and her son (who lives with patient) has just left on vacation.  Patient shows signs of possible delayed grief from husband's loss and anxiety about being alone without son. Patient does not have much support from two older sons (who do not maintain a relationship with their mother) or extended family, though patient did give chaplain permission to contact sister Konrad Dolores: [phone]) which chaplain did. Chaplain offered emotional support and prayer.  Patient most wants placement in a rehab facility and is worried about how she will get her needs met if she has to go home when her son is not there.  She would benefit from a visit from a Education officer, museum and from referrals for senior care services (meals on wheels, for example) including options for home health care.  Please call if spiritual care services are needed again.    Luana Shu [phone]    05/23/15 2200  Clinical Encounter Type  Visited With Patient  Visit Type Spiritual support;Psychological support  Referral From Patient  Consult/Referral To Chaplain  Spiritual Encounters  Spiritual Needs Prayer;Emotional  Stress Factors  Patient Stress Factors Health changes;Family relationships

## 2015-05-23 NOTE — Progress Notes (Signed)
Patient arrived from [facility] ED. Alert and oriented X 4. SR/SB on telemetry. No skin breakdown. C/O headache. Tylenol given. BP 174/84. Patient placed on High fall risk due to chronic dizziness. Bed alarm set and patient advised to call for assistance. Pt oriented to call bell.

## 2015-05-23 NOTE — H&P (Signed)
PCP: [name], MD  Cardiology C[facility] Comes   Referring provider Ayesha Rumpf   Chief Complaint:  Chest pain, elevated BP  HPI: Tiffany Black is a 81 y.o. female   has a past medical history of Hypertension; Arthritis; PAF (paroxysmal atrial fibrillation); Endometrial ca; Anxiety; HLD (hyperlipidemia); Cardiomegaly; Pectus excavatum; OA (osteoarthritis); Vertigo; and echocardiogram.   Presented with  Labile blood pressure. Patient was noted that at home blood pressure was elevated.. Recent emergency department. Requesting blood pressure medication adjustment. Patient has checked her BP at home 19 times in a course of one day. She took extra labetaol 100 for BP 170/91 In ER persistently hypertensive with  blood pressure as high as 214/93. She was given hydralazine with good response but continued to bounce back if elevated blood pressure when she would get visibly upset.Currenlty BP down to 166/70.  She endorses some chest pain but unable to provide details. She reports some neck pain that is chronic. Reports when she woke up her left side  was stiff but she was able to get up.  In ER no localized neurological complaints.  Patient was admitted in end of August with intermittent dizziness. Patient has history of atrial fibrillation currently on Xarelto. She has undergone recent workup stroke carotid ultrasound did not show any significant stenosis. Echogram was negative from [place] source hemoglobin A1c was 5.8. She was cleared by neurology for discharge. Regarding her hypertension patient's medications were adjusted secondary to patient having orthostatic hypotension and acute CVA. Patient has chronic and anxiety issues. Chronic vertigo for which she takes meclizine Hospitalist was called for admission for upper intensive urgency  Review of Systems:    Pertinent positives include:  chest pain, dizziness,  Constitutional:  No weight loss, night sweats, Fevers, chills, fatigue, weight loss    HEENT:  No headaches, Difficulty swallowing,Tooth/dental problems,Sore throat,  No sneezing, itching, ear ache, nasal congestion, post nasal drip,  Cardio-vascular:  No  Orthopnea, PND, anasarca,  palpitations.no Bilateral lower extremity swelling  GI:  No heartburn, indigestion, abdominal pain, nausea, vomiting, diarrhea, change in bowel habits, loss of appetite, melena, blood in stool, hematemesis Resp:  no shortness of breath at rest. No dyspnea on exertion, No excess mucus, no productive cough, No non-productive cough, No coughing up of blood.No change in color of mucus.No wheezing. Skin:  no rash or lesions. No jaundice GU:  no dysuria, change in color of urine, no urgency or frequency. No straining to urinate.  No flank pain.  Musculoskeletal:  No joint pain or no joint swelling. No decreased range of motion. No back pain.  Psych:  No change in mood or affect. No depression or anxiety. No memory loss.  Neuro: no localizing neurological complaints, no tingling, no weakness, no double vision, no gait abnormality, no slurred speech, no confusion  Otherwise ROS are negative except for above, 10 systems were reviewed  Past Medical History: Past Medical History  Diagnosis Date  . Hypertension   . Arthritis   . 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 Endometrial ca   . Anxiety   . HLD (hyperlipidemia)   . Cardiomegaly   . Pectus excavatum   . OA (osteoarthritis)   . Vertigo   . Hx of echocardiogram     a. Echo (11/15): Mild LVH, EF 55-60%, normal wall motion, grade 2 diastolic dysfunction, mild LAE, normal RV function, PASP 29  mm Hg   Past Surgical History  Procedure Laterality Date  . Cesarean section  1975  . Laparoscopic hysterectomy  2011  . Cataract extraction, bilateral Bilateral 2009  . Abdominal hysterectomy  2011     Medications: Prior to  Admission medications   Medication Sig Start Date End Date Taking? Authorizing Provider  acetaminophen (TYLENOL) 325 MG tablet Take 650 mg by mouth at bedtime as needed for headache (pain).    Yes Historical Provider, MD  cholecalciferol (VITAMIN D) 1000 UNITS tablet Take 1,000 Units by mouth daily after lunch.    Yes Historical Provider, MD  DIOVAN 160 MG tablet Take 1 tablet (160 mg total) by mouth 2 (two) times daily. 05/14/15  Yes Shanker Kristeen Mans, MD  FLUoxetine (PROZAC) 10 MG tablet Take 10 mg by mouth daily after lunch.    Yes Historical Provider, MD  hydrocortisone (PROCTOSOL HC) 2.5 % rectal cream Place 1 application rectally daily as needed for hemorrhoids or itching.   Yes Historical Provider, MD  labetalol (NORMODYNE) 200 MG tablet Take 200 mg by mouth See admin instructions. Take 1 tablet (200 mg) by mouth every morning and 1 tablet (200 mg) after supper, may take an extra 1/2 tablet (100 mg) by mouth if BP will not come down.   Yes Historical Provider, MD  meclizine (ANTIVERT) 25 MG tablet Take 25 mg by mouth daily as needed for dizziness.    Yes Historical Provider, MD  Menthol, Topical Analgesic, (BENGAY EX) Apply 1 application topically at bedtime as needed (neck pain).   Yes Historical Provider, MD  metoprolol tartrate (LOPRESSOR) 25 MG tablet Take 1 tablet (25 mg total) by mouth once as needed. For tachypalpitations Patient taking differently: Take 25 mg by mouth daily as needed (tachypalpitations).  11/09/14  Yes Deboraha Sprang, MD  potassium chloride SA (K-DUR,KLOR-CON) 20 MEQ tablet Take 10 mEq by mouth daily after lunch.   Yes Historical Provider, MD  XARELTO 20 MG TABS tablet TAKE ONE TABLET BY MOUTH ONCE DAILY Patient taking differently: TAKE ONE TABLET BY MOUTH ONCE DAILY WITH SUPPER 04/21/15  Yes Deboraha Sprang, MD  zolpidem (AMBIEN) 10 MG tablet Take 5 mg by mouth at bedtime. 05/03/15  Yes Historical Provider, MD  pravastatin (PRAVACHOL) 40 MG tablet Take 1 tablet (40 mg  total) by mouth daily at 6 PM. Patient not taking: Reported on 05/22/2015 05/14/15   Jonetta Osgood, MD    Allergies:   Allergies  Allergen Reactions  . Latex Itching  . Carbamazepine Other (See Comments)    Flushed blood pressure medication out of system  . Codeine Nausea And Vomiting    Nausea and vomiting  . Hydrochlorothiazide Other (See Comments)    Unknown allergic reaction  . Penicillins Hives    Has patient had a PCN reaction causing immediate rash, facial/tongue/throat swelling, SOB or lightheadedness with hypotension: Yes Has patient had a PCN reaction causing severe rash involving mucus membranes or skin necrosis: No Has patient had a PCN reaction that required hospitalization No Has patient had a PCN reaction occurring within the last 10 years: No If all of the above answers are "NO", then may proceed with Cephalosporin use.   . Sulfonamide Derivatives Hives    Hives     Social History:  Ambulatory   Independently  Lives at home with her son     reports that she has never smoked. She has never used smokeless tobacco. She reports that she does not drink alcohol or  use illicit drugs.    Family History: family history includes Heart attack in her father; Heart disease in her father.    Physical Exam: Patient Vitals for the past 24 hrs:  BP Temp Temp src Pulse Resp SpO2  05/23/15 0104 195/88 mmHg - - - - -  05/22/15 2330 176/87 mmHg - - 60 15 98 %  05/22/15 2315 185/81 mmHg - - (!) 59 16 96 %  05/22/15 2230 174/90 mmHg - - 61 17 97 %  05/22/15 2215 160/76 mmHg - - 62 20 98 %  05/22/15 2204 189/82 mmHg - - (!) 59 15 99 %  05/22/15 2146 184/84 mmHg - - - - -  05/22/15 2115 (!) 205/91 mmHg - - (!) 56 15 98 %  05/22/15 2100 190/94 mmHg - - (!) 56 20 100 %  05/22/15 2045 (!) 211/94 mmHg - - (!) 56 14 98 %  05/22/15 1915 198/95 mmHg - - (!) 58 17 97 %  05/22/15 1815 (!) 207/88 mmHg - - (!) 58 13 98 %  05/22/15 1801 - 98.5 F (36.9 C) Oral - - -  05/22/15  1800 (!) 213/93 mmHg - - 60 16 97 %    1. General:  in No Acute distress perseverating regarding blood pressure issues 2. Psychological: Alert and   Oriented 3. Head/ENT:    Dry Mucous Membranes                          Head Non traumatic, neck supple                          Normal  Dentition 4. SKIN: normal   Skin turgor,  Skin clean Dry and intact no rash 5. Heart: Regular rate and rhythm no Murmur, Rub or gallop 6. Lungs: Clear to auscultation bilaterally, no wheezes or crackles   7. Abdomen: Soft, non-tender, Non distended 8. Lower extremities: no clubbing, cyanosis, or edema 9. Neurologically ambulates without difficulty, Cn 2-12 intact strength 5 out of 5 in all 4 extremities 10. MSK: Normal range of motion  body mass index is unknown because there is no weight on file.   Labs on Admission:   Results for orders placed or performed during the hospital encounter of 05/22/15 (from the past 24 hour(s))  CBC with Differential     Status: None   Collection Time: 05/22/15  7:02 PM  Result Value Ref Range   WBC 6.1 4.0 - 10.5 K/uL   RBC 4.25 3.87 - 5.11 MIL/uL   Hemoglobin 12.7 12.0 - 15.0 g/dL   HCT 39.5 36.0 - 46.0 %   MCV 92.9 78.0 - 100.0 fL   MCH 29.9 26.0 - 34.0 pg   MCHC 32.2 30.0 - 36.0 g/dL   RDW 13.3 11.5 - 15.5 %   Platelets 182 150 - 400 K/uL   Neutrophils Relative % 61 43 - 77 %   Neutro Abs 3.8 1.7 - 7.7 K/uL   Lymphocytes Relative 27 12 - 46 %   Lymphs Abs 1.6 0.7 - 4.0 K/uL   Monocytes Relative 7 3 - 12 %   Monocytes Absolute 0.5 0.1 - 1.0 K/uL   Eosinophils Relative 4 0 - 5 %   Eosinophils Absolute 0.2 0.0 - 0.7 K/uL   Basophils Relative 1 0 - 1 %   Basophils Absolute 0.0 0.0 - 0.1 K/uL  Comprehensive metabolic panel  Status: Abnormal   Collection Time: 05/22/15  7:02 PM  Result Value Ref Range   Sodium 136 135 - 145 mmol/L   Potassium 4.1 3.5 - 5.1 mmol/L   Chloride 102 101 - 111 mmol/L   CO2 24 22 - 32 mmol/L   Glucose, Bld 104 (H) 65 - 99 mg/dL    BUN 11 6 - 20 mg/dL   Creatinine, Ser 0.77 0.44 - 1.00 mg/dL   Calcium 9.0 8.9 - 10.3 mg/dL   Total Protein 6.0 (L) 6.5 - 8.1 g/dL   Albumin 3.7 3.5 - 5.0 g/dL   AST 20 15 - 41 U/L   ALT 14 14 - 54 U/L   Alkaline Phosphatase 55 38 - 126 U/L   Total Bilirubin 0.4 0.3 - 1.2 mg/dL   GFR calc non Af Amer >60 >60 mL/min   GFR calc Af Amer >60 >60 mL/min   Anion gap 10 5 - 15  Urinalysis, Routine w reflex microscopic (not at [facility])     Status: Abnormal   Collection Time: 05/22/15  7:50 PM  Result Value Ref Range   Color, Urine YELLOW YELLOW   APPearance CLEAR CLEAR   Specific Gravity, Urine 1.008 1.005 - 1.030   pH 6.0 5.0 - 8.0   Glucose, UA NEGATIVE NEGATIVE mg/dL   Hgb urine dipstick TRACE (A) NEGATIVE   Bilirubin Urine NEGATIVE NEGATIVE   Ketones, ur NEGATIVE NEGATIVE mg/dL   Protein, ur NEGATIVE NEGATIVE mg/dL   Urobilinogen, UA 0.2 0.0 - 1.0 mg/dL   Nitrite NEGATIVE NEGATIVE   Leukocytes, UA MODERATE (A) NEGATIVE  Urine microscopic-add on     Status: Abnormal   Collection Time: 05/22/15  7:50 PM  Result Value Ref Range   Squamous Epithelial / LPF MANY (A) RARE   WBC, UA 11-20 <3 WBC/hpf   RBC / HPF 0-2 <3 RBC/hpf   Bacteria, UA FEW (A) RARE  I-stat troponin, ED     Status: None   Collection Time: 05/22/15 10:25 PM  Result Value Ref Range   Troponin i, poc 0.00 0.00 - 0.08 ng/mL   Comment 3            UA 11-10 white blood cells few bacteria otherwise is asymptomatic patient is afebrile recent urine culture negative  Lab Results  Component Value Date   HGBA1C 5.8* 05/11/2015    Estimated Creatinine Clearance: 60.1 mL/min (by C-G formula based on Cr of 0.77).  BNP (last 3 results) No results for input(s): PROBNP in the last 8760 hours.  Other results:  I have pearsonaly reviewed this: ECG REPORT  Rate:60  Rhythm: NSR ST&T Change: T wave flattening is diffuse QTC 442  There were no vitals filed for this visit.   Cultures:    Component Value Date/Time     SDES URINE, CLEAN CATCH 05/10/2015 1131   SPECREQUEST NONE 05/10/2015 1131   CULT NO GROWTH 1 DAY 05/10/2015 1131   REPTSTATUS 05/11/2015 FINAL 05/10/2015 1131     Radiological Exams on Admission: Ct Head Wo Contrast  05/22/2015   CLINICAL DATA:  Altered mental status with dizziness. Left-sided tightness. Hypertension.  EXAM: CT HEAD WITHOUT CONTRAST  TECHNIQUE: Contiguous axial images were obtained from the base of the skull through the vertex without intravenous contrast.  COMPARISON:  MRI brain 05/10/2015.  CT head 02/28/2013  FINDINGS: Diffuse cerebral atrophy. Mild ventricular dilatation consistent with central atrophy. Cavum septum pellucidum. Patchy low-attenuation changes in the deep white matter consistent with small vessel ischemia. Old  infarct in the left internal capsule and left anterior frontal region. No significant change since prior study. No mass effect or midline shift. No abnormal extra-axial fluid collections. Gray-white matter junctions are distinct. Basal cisterns are not effaced. No evidence of acute intracranial hemorrhage. No depressed skull fractures. Mucosal thickening in the paranasal sinuses most prominent in the left maxillary antrum. Mastoid air cells are not opacified.  IMPRESSION: No acute intracranial abnormalities. Chronic atrophy and small vessel ischemic changes with old focal infarcts. No acute change since previous study.   Electronically Signed   By: Lucienne Capers M.D.   On: 05/22/2015 22:58    Chart has been reviewed  Family not   Bedside     Assessment/Plan  81 yo F with hx of A.fib on Xarelto, recent CVA, chronic vertigo and poorly controlled hypertension being admitted for chest pain and accelerated hypertension  Present on Admission:  . Atrial fibrillation - continue Xarelto currently rate controlled, chad2Vasc score of 6 . accelerated hypertension  - will treat with when necessary hydralazine. Currently blood pressure has improved. She passed  patient had an episode of orthostasis at which point the plan was to allow some permissive hypertension given patient's advanced H&P on Xarelto to avoid falls. The past this has been attempted to explain to the patient.  . Chest pain atypical patient unable to provide details will admit and separation cycle cardiac enzymes. Primary history of CVA. Currently no new neurological findings. Continue to monitor Elevated white count and urine. A symptomatic patients have had this in the past with urine culture being negative. will repeat urine culture holding off on antibiotics for now  Prophylaxis: SCD   CODE STATUS:  FULL CODE  as per patient   Disposition:   To home once workup is complete and patient is stable  Other plan as per orders.  I have spent a total of 55 min on this admission  Advika Mclelland 05/23/2015, 1:04 AM  [facility]  Pager [phone]   after 2 AM please page floor coverage PA If 7AM-7PM, please contact the day team taking care of the patient  [email or web address]  Password [email or web address]

## 2015-05-24 DIAGNOSIS — I10 Essential (primary) hypertension: Secondary | ICD-10-CM | POA: Diagnosis not present

## 2015-05-24 LAB — BASIC METABOLIC PANEL
ANION GAP: 9 (ref 5–15)
BUN: 14 mg/dL (ref 6–20)
CALCIUM: 9 mg/dL (ref 8.9–10.3)
CO2: 25 mmol/L (ref 22–32)
Chloride: 104 mmol/L (ref 101–111)
Creatinine, Ser: 0.93 mg/dL (ref 0.44–1.00)
GFR, EST NON AFRICAN AMERICAN: 57 mL/min — AB (ref >60)
Glucose, Bld: 94 mg/dL (ref 65–99)
POTASSIUM: 3.6 mmol/L (ref 3.5–5.1)
Sodium: 138 mmol/L (ref 135–145)

## 2015-05-24 LAB — URINE CULTURE

## 2015-05-24 LAB — CBC
HEMATOCRIT: 38.5 % (ref 36.0–46.0)
Hemoglobin: 12.7 g/dL (ref 12.0–15.0)
MCH: 30.3 pg (ref 26.0–34.0)
MCHC: 33 g/dL (ref 30.0–36.0)
MCV: 91.9 fL (ref 78.0–100.0)
PLATELETS: 193 10*3/uL (ref 150–400)
RBC: 4.19 MIL/uL (ref 3.87–5.11)
RDW: 13.4 % (ref 11.5–15.5)
WBC: 6.3 10*3/uL (ref 4.0–10.5)

## 2015-05-24 MED ORDER — FELODIPINE ER 2.5 MG PO TB24: 2.5000 mg | ORAL_TABLET | Freq: Every day | ORAL | Status: DC

## 2015-05-24 MED ORDER — ASPIRIN 81 MG PO TBEC: 81.0000 mg | DELAYED_RELEASE_TABLET | Freq: Every day | ORAL | Status: DC

## 2015-05-24 MED ORDER — DIOVAN 160 MG PO TABS: 80.0000 mg | ORAL_TABLET | Freq: Two times a day (BID) | ORAL | Status: DC

## 2015-05-24 MED ORDER — IRBESARTAN 75 MG PO TABS
75.0000 mg | ORAL_TABLET | Freq: Two times a day (BID) | ORAL | Status: DC
Start: 2015-05-24 — End: 2015-05-24
  Filled 2015-05-24: qty 1

## 2015-05-24 NOTE — Progress Notes (Signed)
Report called to Sam at [facility].

## 2015-05-24 NOTE — Progress Notes (Signed)
Talked to patient at length about DCP; Patient wants to privately pay to be admitted to a skilled nursing facility ( her insurance denied authorization for her to go to snf) Butch Penny SW made aware and is in to talk to the patient. Mindi Slicker RN,MHA,BSA

## 2015-05-24 NOTE — Progress Notes (Signed)
Occupational Therapy Evaluation Patient Details Name: Tiffany Black MRN: [id number] DOB: [birth year] Today's Date: 05/24/2015    History of Present Illness 81 y.o. female with history of known PAF on Xeralto and chronic vertigo presenting with dizziness, work up revealed R temporal lobe infarct.   Clinical Impression   Patient presents to OT independent with ADLs and mobility. No further OT needed at this time. She is interested in getting information about hiring someone to assist with cleaning her home and taking out the trash.    Follow Up Recommendations  No OT follow up    Equipment Recommendations  None recommended by OT    Recommendations for Other Services       Precautions / Restrictions Precautions Precautions: None Restrictions Weight Bearing Restrictions: No      Mobility Bed Mobility Overal bed mobility: Independent                Transfers Overall transfer level: Independent Equipment used: None                  Balance                                            ADL Overall ADL's : Independent                                       General ADL Comments: Patient coming out of bathroom on evaluation. Demonstrated ability to don/doff socks, reach all body parts, stand at sink to groom and partially bathe, and toilet. She retrieves items from drawers and cabinets. She has no OT needs at this time.     Vision     Perception     Praxis      Pertinent Vitals/Pain Pain Assessment: No/denies pain     Hand Dominance Right   Extremity/Trunk Assessment Upper Extremity Assessment Upper Extremity Assessment: Overall WFL for tasks assessed   Lower Extremity Assessment Lower Extremity Assessment: Defer to PT evaluation   Cervical / Trunk Assessment Cervical / Trunk Assessment: Normal   Communication Communication Communication: No difficulties   Cognition Arousal/Alertness: Awake/alert Behavior  During Therapy: Anxious Overall Cognitive Status: No family/caregiver present to determine baseline cognitive functioning                     General Comments       Exercises       Shoulder Instructions      Home Living Family/patient expects to be discharged to:: Private residence Living Arrangements: Alone Available Help at Discharge: Family;Other (Comment) (son works during the day) Type of Home: House Home Access: Level entry     [place]: One level     Bathroom Shower/Tub: Tub/shower unit;Curtain Shower/tub characteristics: Architectural technologist: [place]: Tub bench;Hand held Tourist information centre manager - 2 wheels;Cane - single point          Prior Functioning/Environment Level of Independence: Independent with assistive device(s)  Gait / Transfers Assistance Needed: amb with RW since stroke per her report; however, she is ambulating in room without AD     Comments: able to drive, do  housework/laundry, takes care of meals    OT Diagnosis: Generalized weakness   OT Problem List: Cardiopulmonary status limiting activity  OT Treatment/Interventions:      OT Goals(Current goals can be found in the care plan section) Acute Rehab OT Goals Patient Stated Goal: to get some help at home while son is away OT Goal Formulation: All assessment and education complete, DC therapy  OT Frequency:     Barriers to D/C:            Co-evaluation              End of Session Nurse Communication: Mobility status  Activity Tolerance: Patient tolerated treatment well Patient left: in bed;with call bell/phone within reach   Time: 0944-1006 OT Time Calculation (min): 22 min Charges:  OT General Charges $OT Visit: 1 Procedure OT Evaluation $Initial OT Evaluation Tier I: 1 Procedure G-Codes: OT G-codes **NOT FOR INPATIENT CLASS** Functional Assessment Tool Used: clinical judgment Functional Limitation: Self care Self Care Current Status  (O7096): At least 1 percent but less than 20 percent impaired, limited or restricted Self Care Goal Status (G8366): At least 1 percent but less than 20 percent impaired, limited or restricted Self Care Discharge Status [phone]): At least 1 percent but less than 20 percent impaired, limited or restricted  Tiffany Black A 05/24/2015, 10:45 AM

## 2015-05-24 NOTE — Discharge Summary (Signed)
[facility]  Physician Discharge Summary   Patient ID: Tiffany Black MRN: [id number] DOB/AGE: May 25, 1934 81 y.o.  Admit date: 05/22/2015 Discharge date: 05/24/2015  PCP: [name], MD  DISCHARGE DIAGNOSES:  Active Problems:   Essential hypertension, benign   Atrial fibrillation   Hypertensive urgency   Chest pain   Essential hypertension   RECOMMENDATIONS FOR OUTPATIENT FOLLOW UP: 1. Continue to monitor blood pressures closely. She may need further dose adjustments.   DISCHARGE CONDITION: fair  Diet recommendation: Heart healthy  Filed Weights   05/23/15 0348 05/24/15 0520  Weight: 79.652 kg (175 lb 9.6 oz) 78.744 kg (173 lb 9.6 oz)    INITIAL HISTORY: 81 year old Caucasian female with a past medical history of paroxysmal atrial fibrillation, hyperlipidemia, osteoarthritis, hypertension, was recently discharged from the hospital after being managed for an acute stroke. She was noted to have elevated blood pressures at that time. She presented back to the emergency department with complaints of elevated blood pressure and chest pains. She was hospitalized for further management.   HOSPITAL COURSE:   Accelerated hypertension  Patient was admitted with significantly elevated blood pressures. She was quite concerned about her poorly controlled blood pressure. She denies any symptoms of orthostatic hypotension at home. This was the main reason that her felodipine was held during previous hospitalization when she was diagnosed with acute stroke. She was started back on the felodipine. Blood pressure is much improved. Systolic blood pressure was 120 this morning. We will cut back on her ARB. She was told that her blood pressure medications may need further titration depending on how her blood pressure does over the next few days to weeks. Patient appears to be very anxious and appears to obssessively check her blood pressures at home.  Left-sided neck pain  No recent  falls or injuries. No neurological deficits noted. She had MRI of her cervical spine in 2010 which revealed a lot of degenerative changes with facet arthropathy on multiple levels, mainly on the left. This is the most likely reason for her pain. Physical therapy and analgesic agents.   History of atrial fibrillation  Continue her beta blocker. Continue oral anticoagulation.   Recent stroke  Stable. No new neurological deficits. She was noted to have an LDL of 140 during her previous hospitalization. She was started on pravastatin. She has previously taken Lipitor with which she developed generalized weakness. I have told her that pravastatin typically has fewer adverse effects compared to Lipitor. She still seems reluctant. She was educated regarding risks and benefits in view of her recent stroke.  Abnormal UA  Many squamous epithelial cells noted. This is likely a contaminated sample. Patient denies any dysuria. No need for treatment.   Atypical chest pain  Cardiac enzymes are negative. EKG was nonischemic. Pain has resolved. No further workup anticipated.  Patient was seen by physical and occupational therapy. Skilled nursing facility has been recommended. Patient will think about this. Medically she is stable for discharge.   PERTINENT LABS:  The results of significant diagnostics from this hospitalization (including imaging, microbiology, ancillary and laboratory) are listed below for reference.    Microbiology: Recent Results (from the past 240 hour(s))  Urine culture     Status: None   Collection Time: 05/22/15  7:50 PM  Result Value Ref Range Status   Specimen Description URINE, RANDOM  Final   Special Requests ADDED 2224  Final   Culture   Final    50,000 COLONIES/mL DIPHTHEROIDS(CORYNEBACTERIUM SPECIES) Standardized susceptibility testing for this  organism is not available.    Report Status 05/24/2015 FINAL  Final     Labs: Basic Metabolic Panel:  Recent Labs Lab  05/22/15 1902 05/23/15 0436 05/24/15 0332  NA 136 139 138  K 4.1 3.6 3.6  CL 102 106 104  CO2 24 25 25   GLUCOSE 104* 103* 94  BUN 11 8 14   CREATININE 0.77 0.75 0.93  CALCIUM 9.0 9.2 9.0  MG  --  2.0  --   PHOS  --  3.0  --    Liver Function Tests:  Recent Labs Lab 05/22/15 1902 05/23/15 0436  AST 20 20  ALT 14 14  ALKPHOS 55 57  BILITOT 0.4 0.7  PROT 6.0* 6.3*  ALBUMIN 3.7 3.7   CBC:  Recent Labs Lab 05/22/15 1902 05/23/15 0436 05/24/15 0332  WBC 6.1 6.6 6.3  NEUTROABS 3.8  --   --   HGB 12.7 13.2 12.7  HCT 39.5 39.9 38.5  MCV 92.9 91.5 91.9  PLT 182 183 193   Cardiac Enzymes:  Recent Labs Lab 05/23/15 0436 05/23/15 1020 05/23/15 1514  TROPONINI <0.03 <0.03 <0.03     IMAGING STUDIES Ct Head Wo Contrast  05/22/2015   CLINICAL DATA:  Altered mental status with dizziness. Left-sided tightness. Hypertension.  EXAM: CT HEAD WITHOUT CONTRAST  TECHNIQUE: Contiguous axial images were obtained from the base of the skull through the vertex without intravenous contrast.  COMPARISON:  MRI brain 05/10/2015.  CT head 02/28/2013  FINDINGS: Diffuse cerebral atrophy. Mild ventricular dilatation consistent with central atrophy. Cavum septum pellucidum. Patchy low-attenuation changes in the deep white matter consistent with small vessel ischemia. Old infarct in the left internal capsule and left anterior frontal region. No significant change since prior study. No mass effect or midline shift. No abnormal extra-axial fluid collections. Gray-white matter junctions are distinct. Basal cisterns are not effaced. No evidence of acute intracranial hemorrhage. No depressed skull fractures. Mucosal thickening in the paranasal sinuses most prominent in the left maxillary antrum. Mastoid air cells are not opacified.  IMPRESSION: No acute intracranial abnormalities. Chronic atrophy and small vessel ischemic changes with old focal infarcts. No acute change since previous study.    Electronically Signed   By: Lucienne Capers M.D.   On: 05/22/2015 22:58    DISCHARGE EXAMINATION: Filed Vitals:   05/24/15 0520 05/24/15 0808 05/24/15 0940 05/24/15 1206  BP: 144/69 151/77 124/64 159/78  Pulse: 64 69  65  Temp: 97.7 F (36.5 C) 98.4 F (36.9 C)  98.2 F (36.8 C)  TempSrc: Oral Oral  Oral  Resp: 18 18  18   Height:      Weight: 78.744 kg (173 lb 9.6 oz)     SpO2: 96% 95%  94%   General appearance: alert, cooperative, appears stated age and no distress Resp: clear to auscultation bilaterally Cardio: regular rate and rhythm, S1, S2 normal, no murmur, click, rub or gallop GI: soft, non-tender; bowel sounds normal; no masses,  no organomegaly  DISPOSITION: SNF versus home with home health  Discharge Instructions    Call MD for:  extreme fatigue    Complete by:  As directed      Call MD for:  persistant dizziness or light-headedness    Complete by:  As directed      Call MD for:  persistant nausea and vomiting    Complete by:  As directed      Call MD for:  severe uncontrolled pain    Complete by:  As directed  Call MD for:  temperature >100.4    Complete by:  As directed      Diet - low sodium heart healthy    Complete by:  As directed      Discharge instructions    Complete by:  As directed   Blood pressures to be monitored closely. May need further adjustment to her medications.  You were cared for by a hospitalist during your hospital stay. If you have any questions about your discharge medications or the care you received while you were in the hospital after you are discharged, you can call the unit and asked to speak with the hospitalist on call if the hospitalist that took care of you is not available. Once you are discharged, your primary care physician will handle any further medical issues. Please note that NO REFILLS for any discharge medications will be authorized once you are discharged, as it is imperative that you return to your primary care  physician (or establish a relationship with a primary care physician if you do not have one) for your aftercare needs so that they can reassess your need for medications and monitor your lab values. If you do not have a primary care physician, you can call [phone] for a physician referral.     Increase activity slowly    Complete by:  As directed            ALLERGIES:  Allergies  Allergen Reactions  . Latex Itching  . Carbamazepine Other (See Comments)    Flushed blood pressure medication out of system  . Codeine Nausea And Vomiting    Nausea and vomiting  . Hydrochlorothiazide Other (See Comments)    Unknown allergic reaction  . Penicillins Hives    Has patient had a PCN reaction causing immediate rash, facial/tongue/throat swelling, SOB or lightheadedness with hypotension: Yes Has patient had a PCN reaction causing severe rash involving mucus membranes or skin necrosis: No Has patient had a PCN reaction that required hospitalization No Has patient had a PCN reaction occurring within the last 10 years: No If all of the above answers are "NO", then may proceed with Cephalosporin use.   . Sulfonamide Derivatives Hives    Hives      Current Discharge Medication List    START taking these medications   Details  aspirin EC 81 MG EC tablet Take 1 tablet (81 mg total) by mouth daily. Qty: 30 tablet, Refills: 0    felodipine (PLENDIL) 2.5 MG 24 hr tablet Take 1 tablet (2.5 mg total) by mouth daily. Qty: 30 tablet, Refills: 0      CONTINUE these medications which have CHANGED   Details  DIOVAN 160 MG tablet Take 0.5 tablets (80 mg total) by mouth 2 (two) times daily. Qty: 60 tablet, Refills: 0      CONTINUE these medications which have NOT CHANGED   Details  acetaminophen (TYLENOL) 325 MG tablet Take 650 mg by mouth at bedtime as needed for headache (pain).     cholecalciferol (VITAMIN D) 1000 UNITS tablet Take 1,000 Units by mouth daily after lunch.     FLUoxetine  (PROZAC) 10 MG tablet Take 10 mg by mouth daily after lunch.     hydrocortisone (PROCTOSOL HC) 2.5 % rectal cream Place 1 application rectally daily as needed for hemorrhoids or itching.    labetalol (NORMODYNE) 200 MG tablet Take 200 mg by mouth See admin instructions. Take 1 tablet (200 mg) by mouth every morning and 1 tablet (200  mg) after supper, may take an extra 1/2 tablet (100 mg) by mouth if BP will not come down.    meclizine (ANTIVERT) 25 MG tablet Take 25 mg by mouth daily as needed for dizziness.     Menthol, Topical Analgesic, (BENGAY EX) Apply 1 application topically at bedtime as needed (neck pain).    metoprolol tartrate (LOPRESSOR) 25 MG tablet Take 1 tablet (25 mg total) by mouth once as needed. For tachypalpitations Qty: 10 tablet, Refills: 0    potassium chloride SA (K-DUR,KLOR-CON) 20 MEQ tablet Take 10 mEq by mouth daily after lunch.    XARELTO 20 MG TABS tablet TAKE ONE TABLET BY MOUTH ONCE DAILY Qty: 90 tablet, Refills: 0    zolpidem (AMBIEN) 10 MG tablet Take 5 mg by mouth at bedtime. Refills: 2    pravastatin (PRAVACHOL) 40 MG tablet Take 1 tablet (40 mg total) by mouth daily at 6 PM. Qty: 60 tablet, Refills: 0       Follow-up Information    Schedule an appointment as soon as possible for a visit with [name], MD.   Specialty:  Family Medicine   Contact information:   [place] [address] [phone]       TOTAL DISCHARGE TIME: 16 minutes  [facility] Pager [phone]  05/24/2015, 1:45 PM

## 2015-05-24 NOTE — Clinical Social Work Placement (Signed)
CLINICAL SOCIAL WORK PLACEMENT  NOTE  Date:  05/24/2015  Patient Details  Name: Tiffany Black MRN: [id number] Date of Birth: 25-Aug-1934  Clinical Social Work is seeking post-discharge placement for this patient at the [place] level of care (*CSW will initial, date and re-position this form in  chart as items are completed):  Yes   Patient/family provided with [facility] Work Department's list of facilities offering this level of care within the geographic area requested by the patient (or if unable, by the patient's family).  Yes   Patient/family informed of their freedom to choose among providers that offer the needed level of care, that participate in Medicare, Medicaid or managed care program needed by the patient, have an available bed and are willing to accept the patient.  Yes   Patient/family informed of [facility]'s ownership interest in [facility] and [facility], as well as of the fact that they are under no obligation to receive care at these facilities.  PASRR submitted to EDS on       PASRR number received on       Existing PASRR number confirmed on 05/24/15     FL2 transmitted to all facilities in geographic area requested by pt/family on 05/24/15     FL2 transmitted to all facilities within larger geographic area on       Patient informed that his/her managed care company has contracts with or will negotiate with certain facilities, including the following:   ([facility])     Yes   Patient/family informed of bed offers received.  Patient chooses bed at [facility]     Physician recommends and patient chooses bed at      Patient to be transferred to [facility] on 05/24/15.  Patient to be transferred to facility by Car with Friend     Patient family notified on 05/24/15 of transfer.  Name of family member notified:  Son Tiffany Black  (patient states she notified her son of d/c plan)      PHYSICIAN Please prepare priority discharge summary, including medications, Please sign FL2, Please prepare prescriptions     Additional Comment:  OK per MD for d/c today. Patient requested SNF placement and active bed search completed.  CSW attempted to obtain prior authorization from [facility] and clinicals were sent to facility for review. CSW received call that patient did not meet criteria for SNF placement with [facility] Medicare.  CSW spent extensive time talking to patient who became very upset and had great difficulty understanding reason for the denial.  A Peer to Peer was completed by Dr. [place] Pink with [facility] of [facility] but patient still declined.  Patient adamant that she does not feel she is safe at home and wanted to "pay" privately at the facility. She has been a resident of [facility] in the past and requested placement there or at [facility].  CSW spoke with Claiborne Billings at [facility] and she offered patient a private pay arrangement at the facility starting with 2 weeks and she could stay longer if needed. Patient may be interested in seeking ALF placement there in the near future.  Nursing notified to call report.  Patient is able to ambulate some, even tho stating she is weak and at times unsteady. She wanted to return home to pack her belongings and get her check-book.  Patient contacted her friend Tiffany Black who arranged to transport her via car for this purpose.  Patient's son Tiffany Black  is aware of above and stated that this was the best arrangement for patient. He is currently on a brief vacation away from home and work.  No further CSW needs identified. CSW signing off. Tiffany Black. Murrell Redden [phone]     _______________________________________________ Williemae Area, LCSW 05/24/2015, 6:45 PM

## 2015-05-24 NOTE — Evaluation (Signed)
Physical Therapy Evaluation Patient Details Name: Tiffany Black MRN: [id number] DOB: 02-01-1934 Today's Date: 05/24/2015   History of Present Illness  Tiffany Black is a 81 y.o. female has a past medical history of Hypertension; Arthritis; PAF (paroxysmal atrial fibrillation); Endometrial ca; Anxiety; HLD (hyperlipidemia); Cardiomegaly; Pectus excavatum; OA (osteoarthritis); Vertigo; and echocardiogram. Presents with labile BP after just being d/c'ed for temporal lobe infarct and orthostatic hypotension.   Clinical Impression  Pt admitted with above diagnosis. Pt currently with functional limitations due to the deficits listed below (see PT Problem List). Pt functioning well in seated positions and with activities that require very little mobility but since returning home, pt has become weaker and has not progressed on a community mobility level which is important for her as she is home alone most of the time. Feel that she could return to this level with a short SNF stay for rehab. If this is not possible for any reason, recommend HHPT and HHaide.  Pt will benefit from skilled PT to increase their independence and safety with mobility to allow discharge to the venue listed below.       Follow Up Recommendations SNF    Equipment Recommendations  None recommended by PT    Recommendations for Other Services       Precautions / Restrictions Precautions Precautions: None Restrictions Weight Bearing Restrictions: No      Mobility  Bed Mobility Overal bed mobility: Independent                Transfers Overall transfer level: Needs assistance Equipment used: Rolling walker (2 wheeled) Transfers: Sit to/from Stand Sit to Stand: Supervision         General transfer comment: mod I for first sit to stand but after she had performed ambulation and exercises, she became unsteady with subsequent sit to stand and required supervision for safety  Ambulation/Gait Ambulation/Gait  assistance: Min guard;Supervision Ambulation Distance (Feet): 200 Feet Assistive device: Rolling walker (2 wheeled) Gait Pattern/deviations: Step-through pattern;Drifts right/left;Decreased stride length Gait velocity: decreased Gait velocity interpretation: Below normal speed for age/gender General Gait Details: pt needed only supervision first 100' but then began to fatigue with decreased cadence and shaking in bilateral LE's. Pt does struggle with anxiety and this is likely also a factor with increased RR and HR (113 bpm). Close supervision given last 100'. No LOB noted with RW.   Stairs            Wheelchair Mobility    Modified Rankin (Stroke Patients Only)       Balance Overall balance assessment: Needs assistance Sitting-balance support: No upper extremity supported Sitting balance-Leahy Scale: Normal     Standing balance support: No upper extremity supported Standing balance-Leahy Scale: Fair Standing balance comment: pt can maintain static standing with widened BOS without UE support but loses balance when challenged, especially to the left. Demonstrates significantly increased postural sway in rhomberg stance. Needs min A to assume and maintain tandem stance and SL stance.                High Level Balance Comments: gave pt balance exercises to perform post d/c             Pertinent Vitals/Pain Pain Assessment: No/denies pain    Home Living Family/patient expects to be discharged to:: Private residence Living Arrangements: Alone Available Help at Discharge: Family;Available PRN/intermittently Type of Home: House Home Access: Level entry     Home Layout: One level Home Equipment: Tub bench;Hand held shower  head;Walker - 2 wheels;Cane - single point Additional Comments: pt's son lives with her but works in the day and is currently on a prolonged vacation so she is alone. Pt reports that since she went home from [facility], she has not been able to return to  normal function, has not felt safe at home by herself, and fatigues very quickly.     Prior Function Level of Independence: Independent with assistive device(s)   Gait / Transfers Assistance Needed: amb with RW since stroke     Comments: pt reports that she has been trying to return to her normal activities such as laundry and cooking but that she is having to sit and rest every few minutes and cannot carry things because she has not progressed off RW     Hand Dominance   Dominant Hand: Right    Extremity/Trunk Assessment   Upper Extremity Assessment: Defer to OT evaluation           Lower Extremity Assessment: Generalized weakness;RLE deficits/detail;LLE deficits/detail RLE Deficits / Details: pt has not been ambulating and moving around at home at level prior to last admission and seems to have generalized weakness since that admission, specifically in the LE's. After 100' of ambulation, her legs become fatigued and shaky and she has to stop to rest. On MMT, she tests 4/5 throughout but fatigues very quickly so cannot maintain this strength and after first few repetitions, is quickly 3/5.  LLE Deficits / Details: slightly weaker than right side  Cervical / Trunk Assessment: Normal  Communication   Communication: No difficulties  Cognition Arousal/Alertness: Awake/alert Behavior During Therapy: Anxious Overall Cognitive Status: No family/caregiver present to determine baseline cognitive functioning                      General Comments      Exercises        Assessment/Plan    PT Assessment Patient needs continued PT services  PT Diagnosis Generalized weakness;Abnormality of gait   PT Problem List Decreased strength;Decreased activity tolerance;Decreased balance;Decreased mobility;Decreased knowledge of precautions  PT Treatment Interventions DME instruction;Gait training;Functional mobility training;Therapeutic activities;Therapeutic exercise;Balance  training;Patient/family education;Neuromuscular re-education;Cognitive remediation   PT Goals (Current goals can be found in the Care Plan section) Acute Rehab PT Goals Patient Stated Goal: get stronger and back to PLOF PT Goal Formulation: With patient Time For Goal Achievement: 06/07/15 Potential to Achieve Goals: Good Additional Goals Additional Goal #1: pt to score >42/56 on Berg balance scale    Frequency Min 3X/week   Barriers to discharge Decreased caregiver support pt often home alone    Co-evaluation               End of Session Equipment Utilized During Treatment: Gait belt Activity Tolerance: Patient tolerated treatment well Patient left: with call bell/phone within reach;in chair Nurse Communication: Mobility status    Functional Assessment Tool Used: clinical judgement Functional Limitation: Mobility: Walking and moving around Mobility: Walking and Moving Around Current Status (W0981): At least 1 percent but less than 20 percent impaired, limited or restricted Mobility: Walking and Moving Around Goal Status [phone]): 0 percent impaired, limited or restricted    Time: 1111-1147 PT Time Calculation (min) (ACUTE ONLY): 36 min   Charges:   PT Evaluation $Initial PT Evaluation Tier I: 1 Procedure PT Treatments $Gait Training: 8-22 mins   PT G Codes:   PT G-Codes **NOT FOR INPATIENT CLASS** Functional Assessment Tool Used: clinical judgement Functional Limitation: Mobility: Walking and  moving around Mobility: Walking and Moving Around Current Status [phone]): At least 1 percent but less than 20 percent impaired, limited or restricted Mobility: Walking and Moving Around Goal Status [phone]): 0 percent impaired, limited or restricted   Leighton Roach, [place], [place] 05/24/2015, 12:44 PM

## 2015-06-21 LAB — BASIC METABOLIC PANEL
BUN: 18 mg/dL (ref 4–21)
CREATININE: 0.8 mg/dL (ref 0.5–1.1)
GLUCOSE: 30 mg/dL
POTASSIUM: 4.4 mmol/L (ref 3.4–5.3)
SODIUM: 139 mmol/L (ref 137–147)

## 2015-07-27 ENCOUNTER — Ambulatory Visit (HOSPITAL_COMMUNITY)
Admission: RE | Admit: 2015-07-27 | Discharge: 2015-07-27 | Disposition: A | Discharge status: Home / Self Care | Payer: Medicare Other | Source: Ambulatory Visit | Attending: Nurse Practitioner | Admitting: Nurse Practitioner

## 2015-07-27 ENCOUNTER — Telehealth: Payer: Self-pay | Admitting: Internal Medicine

## 2015-07-27 VITALS — BP 138/88 | HR 84 | Ht 65.0 in | Wt 178.2 lb

## 2015-07-27 DIAGNOSIS — F419 Anxiety disorder, unspecified: Secondary | ICD-10-CM | POA: Insufficient documentation

## 2015-07-27 DIAGNOSIS — I10 Essential (primary) hypertension: Secondary | ICD-10-CM | POA: Diagnosis not present

## 2015-07-27 DIAGNOSIS — I480 Paroxysmal atrial fibrillation: Secondary | ICD-10-CM | POA: Insufficient documentation

## 2015-07-27 NOTE — Telephone Encounter (Signed)
New problem    Pt had a rapid pulse rate and had to call EMS last night. Pt would like to speak to nurse and possibly come in today to get checked.

## 2015-07-27 NOTE — Telephone Encounter (Signed)
Please see note bellow. Pt's issue was addressed.

## 2015-07-27 NOTE — Telephone Encounter (Signed)
F/u    Pt calling stating that a nurse was suppose to call her back to let her know if she could be seen today. Pt states she is feeling worse and keeps feeling like she is going to pass out. Pt wants to know if she can come into our office or does she need to call EMS. Please call back.

## 2015-07-27 NOTE — Telephone Encounter (Signed)
Pt states had to called  EMS last night at midnight due to rapid heart rate and high BP. Pt said that about 9:30 Pm she started having  palpitations weakness some SOB pt took a Metoprolol 25 mg, and at midnight pt had to call EMS, because the medication did not work. Pt's BP was 130/101 to 150/106. The EMS stayed  with pt until she was feeling better. Pt said that EMS recommended for her to call her cardiology today. Pt is nervous because she may have another episode like last night. Pt wants to be seen today because she feels weak. BP this morning is 109/80 HR 87 to 96 beats/minute. Spoke with Richardson Dopp PA. He States that  pt needs to be seen in the A-fib clinic.  Called the A-fib clinic and spoke with [facility]. Pt will be seen in the at 3:00 PM today. Pt is aware.

## 2015-07-28 ENCOUNTER — Encounter (HOSPITAL_COMMUNITY): Payer: Self-pay | Admitting: Nurse Practitioner

## 2015-07-28 ENCOUNTER — Telehealth (HOSPITAL_COMMUNITY): Payer: Self-pay | Admitting: *Deleted

## 2015-07-28 NOTE — Telephone Encounter (Signed)
Patient called to report she was feeling some better. She was able to rest last night and heart seems to have settled down some.  HR 90s now and BP 98/70. Patient still in afib as reports.  Will set up follow up for Friday to reassess rhythm.

## 2015-07-28 NOTE — Progress Notes (Signed)
Patient ID: Tiffany Black, female   DOB: [birth year], 81 y.o.   MRN: [id number] .af Patient ID: Tiffany Black, female   DOB: June 12, 1934, 81 y.o.   MRN: [id number]     Primary Care Physician: Tiffany Schwalbe, MD Referring Physician:Dr. Azilda Steidel is a 81 y.o. female with a h/o PAF, HTN,anxiety that was seen 8/2  in the afib clinic. She asked to be seen today because she went back into afib yesterday around 9 pm. When she woke up around midnight and she was still in afib she called EMS. States her heart rate was around 114 bpm then. EMS reassured her and asked her to f/u with cardiology today. She reports that she was in the hospital in late August with dx of stroke without any neuro deficients and again a few days later with elevation of BP. SHe is very anxious. She took her prn metoprolol todat mid afternoon and her v rate is controlled in the mid 80's. With lower v rates, she feels better.BP stable at 138/88. She has been maintaining SR and then is the first episode in some time.  Today, she denies symptoms of palpitations, chest pain, shortness of breath, orthopnea, PND, lower extremity edema, dizziness, presyncope, syncope, or neurologic sequela. The patient is tolerating medications without difficulties and is otherwise without complaint today.   Past Medical History  Diagnosis Date  . Hypertension   . Arthritis   . 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 Endometrial ca ([place])   . Anxiety   . HLD (hyperlipidemia)   . Cardiomegaly   . Pectus excavatum   . OA (osteoarthritis)   . Vertigo   . Hx of echocardiogram     a. Echo (11/15): Mild LVH, EF 55-60%, normal wall motion, grade 2 diastolic dysfunction, mild LAE, normal RV function, PASP 29 mm Hg   Past Surgical History  Procedure Laterality Date  . Cesarean section  1975  . Laparoscopic hysterectomy   2011  . Cataract extraction, bilateral Bilateral 2009  . Abdominal hysterectomy  2011    Current Outpatient Prescriptions  Medication Sig Dispense Refill  . acetaminophen (TYLENOL) 325 MG tablet Take 650 mg by mouth at bedtime as needed for headache (pain).     Marland Kitchen aspirin EC 81 MG EC tablet Take 1 tablet (81 mg total) by mouth daily. 30 tablet 0  . cholecalciferol (VITAMIN D) 1000 UNITS tablet Take 1,000 Units by mouth daily after lunch.     . DIOVAN 160 MG tablet Take 0.5 tablets (80 mg total) by mouth 2 (two) times daily. 60 tablet 0  . felodipine (PLENDIL) 2.5 MG 24 hr tablet Take 1 tablet (2.5 mg total) by mouth daily. 30 tablet 0  . FLUoxetine (PROZAC) 10 MG tablet Take 10 mg by mouth daily after lunch.     . hydrocortisone (PROCTOSOL HC) 2.5 % rectal cream Place 1 application rectally daily as needed for hemorrhoids or itching.    . labetalol (NORMODYNE) 200 MG tablet Take 200 mg by mouth See admin instructions. Take 1 tablet (200 mg) by mouth every morning and 1 tablet (200 mg) after supper, may take an extra 1/2 tablet (100 mg) by mouth if BP will not come down.    . meclizine (ANTIVERT) 25 MG tablet Take 25 mg by mouth daily as needed for dizziness.     . Menthol,  Topical Analgesic, (BENGAY EX) Apply 1 application topically at bedtime as needed (neck pain).    . metoprolol tartrate (LOPRESSOR) 25 MG tablet Take 1 tablet (25 mg total) by mouth once as needed. For tachypalpitations (Patient taking differently: Take 25 mg by mouth daily as needed (tachypalpitations). ) 10 tablet 0  . potassium chloride SA (K-DUR,KLOR-CON) 20 MEQ tablet Take 10 mEq by mouth daily after lunch.    . pravastatin (PRAVACHOL) 40 MG tablet Take 1 tablet (40 mg total) by mouth daily at 6 PM. 60 tablet 0  . XARELTO 20 MG TABS tablet TAKE ONE TABLET BY MOUTH ONCE DAILY (Patient taking differently: TAKE ONE TABLET BY MOUTH ONCE DAILY WITH SUPPER) 90 tablet 0  . zolpidem (AMBIEN) 10 MG tablet Take 5 mg by mouth at  bedtime.  2   No current facility-administered medications for this encounter.    Allergies  Allergen Reactions  . Latex Itching  . Carbamazepine Other (See Comments)    Flushed blood pressure medication out of system  . Codeine Nausea And Vomiting    Nausea and vomiting  . Hydrochlorothiazide Other (See Comments)    Unknown allergic reaction  . Penicillins Hives    Has patient had a PCN reaction causing immediate rash, facial/tongue/throat swelling, SOB or lightheadedness with hypotension: Yes Has patient had a PCN reaction causing severe rash involving mucus membranes or skin necrosis: No Has patient had a PCN reaction that required hospitalization No Has patient had a PCN reaction occurring within the last 10 years: No If all of the above answers are "NO", then may proceed with Cephalosporin use.   . Sulfonamide Derivatives Hives    Hives     Social History   Social History  . Marital Status: Married    Spouse Name: N/A  . Number of Children: N/A  . Years of Education: N/A   Occupational History  . Retired    Social History Main Topics  . Smoking status: Never Smoker   . Smokeless tobacco: Never Used  . Alcohol Use: No  . Drug Use: No  . Sexual Activity: Yes   Other Topics Concern  . Not on file   Social History Narrative    Family History  Problem Relation Age of Onset  . Heart attack Father   . Heart disease Father     ROS- All systems are reviewed and negative except as per the HPI above  Physical Exam: Filed Vitals:   07/27/15 1510  BP: 138/88  Pulse: 84  Height: 5\' 5"  (1.651 m)  Weight: 178 lb 3.2 oz (80.831 kg)    GEN- The patient is well appearing, alert and oriented x 3 today.   Head- normocephalic, atraumatic Eyes-  Sclera clear, conjunctiva pink Ears- hearing intact Oropharynx- clear Neck- supple, no JVP Lymph- no cervical lymphadenopathy Lungs- Clear to ausculation bilaterally, normal work of breathing Heart- Irregular rate and  rhythm, no murmurs, rubs or gallops, PMI not laterally displaced GI- soft, NT, ND, + BS Extremities- no clubbing, cyanosis, or edema MS- no significant deformity or atrophy Skin- no rash or lesion Psych- euthymic mood, full affect Neuro- strength and sensation are intact  EKG-  afib v rater 84 bpm, qrs int 82 ms, qtc int 397 ms, LAFB Epic records reviewed   Assessment and Plan: 1. PAF Burden overall appears to be  low.  Reminded how to use prn metoprolol Continue xarelto.  2. HTN Appears to be well controlled.  3. Anxiety Reassured her re afib  and when going to ER/calling EMS would be appropriate.  She will take her prn metoprolol again this evening around 10 pm if still in afib and it persists will see her back in clinic on Friday to discuss cardioversion   F/u with Chanetta Marshall as scheduled  09/08/15 Afib clinic as needed.  Geroge Baseman [place], [facility] [address] [place], [place] [address] [phone]

## 2015-07-30 ENCOUNTER — Encounter (HOSPITAL_COMMUNITY): Payer: Self-pay | Admitting: Nurse Practitioner

## 2015-07-30 ENCOUNTER — Ambulatory Visit (HOSPITAL_COMMUNITY)
Admission: RE | Admit: 2015-07-30 | Discharge: 2015-07-30 | Disposition: A | Discharge status: Home / Self Care | Payer: Medicare Other | Source: Ambulatory Visit | Attending: Nurse Practitioner | Admitting: Nurse Practitioner

## 2015-07-30 VITALS — BP 142/96 | HR 82 | Ht 65.0 in | Wt 181.2 lb

## 2015-07-30 DIAGNOSIS — I4819 Other persistent atrial fibrillation: Secondary | ICD-10-CM

## 2015-07-30 DIAGNOSIS — I10 Essential (primary) hypertension: Secondary | ICD-10-CM | POA: Diagnosis not present

## 2015-07-30 DIAGNOSIS — F419 Anxiety disorder, unspecified: Secondary | ICD-10-CM | POA: Insufficient documentation

## 2015-07-30 DIAGNOSIS — I481 Persistent atrial fibrillation: Secondary | ICD-10-CM | POA: Diagnosis present

## 2015-07-30 MED ORDER — METOPROLOL TARTRATE 25 MG PO TABS: 25.0000 mg | ORAL_TABLET | Freq: Every day | ORAL | Status: DC

## 2015-07-30 NOTE — Progress Notes (Signed)
Patient ID: Tiffany Black, female   DOB: 05-Jun-1934, 81 y.o.   MRN: [id number]     Primary Care Physician: [name], MD Referring Physician:Dr. Alitzah Henkelman is a 81 y.o. female with a h/o PAF, HTN,anxiety that was seen 8/2  in the afib clinic. She  apparently went into afib 11/15 around 9 pm. When she woke up around midnight and she was still in afib she called EMS. States her heart rate was around 114 bpm then. EMS reassured her and asked her to f/u with cardiology today. She reports that she was in the hospital in late August with dx of stroke without any neuro deficients and again a few days later with elevation of BP. SHe is very anxious.  V rate is controlled in the mid 80's. With lower v rates, she felt better.BP stable at 138/88. She has been maintaining SR and this was the first episode in some time.  She returns today with afib with controlled v rates. She was surprised to hear she is in afib. She has been taking a prn dose of metoprolol, more in the evening, in addition to her labetalol, if she feels her heart bumping around, and it makes her sleep better. I tried to discuss cardioversion, but she is very anxious and wanted to talk about other various other ailments and finally stated  that she was very anxious re the procedure and did not necessarily want to have it done. She denies nay shortness of breath or fatigue.  Today, she denies symptoms of   chest pain, shortness of breath, orthopnea, PND, lower extremity edema, dizziness, presyncope, syncope, or neurologic sequela. Positive for palpitations, The patient is tolerating medications without difficulties and is otherwise without complaint today.   Past Medical History  Diagnosis Date  . Hypertension   . Arthritis   . 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 Endometrial ca ([place])   . Anxiety     . HLD (hyperlipidemia)   . Cardiomegaly   . Pectus excavatum   . OA (osteoarthritis)   . Vertigo   . Hx of echocardiogram     a. Echo (11/15): Mild LVH, EF 55-60%, normal wall motion, grade 2 diastolic dysfunction, mild LAE, normal RV function, PASP 29 mm Hg   Past Surgical History  Procedure Laterality Date  . Cesarean section  1975  . Laparoscopic hysterectomy  2011  . Cataract extraction, bilateral Bilateral 2009  . Abdominal hysterectomy  2011    Current Outpatient Prescriptions  Medication Sig Dispense Refill  . acetaminophen (TYLENOL) 325 MG tablet Take 650 mg by mouth at bedtime as needed for headache (pain).     Marland Kitchen aspirin EC 81 MG EC tablet Take 1 tablet (81 mg total) by mouth daily. 30 tablet 0  . cholecalciferol (VITAMIN D) 1000 UNITS tablet Take 1,000 Units by mouth daily after lunch.     . DIOVAN 160 MG tablet Take 0.5 tablets (80 mg total) by mouth 2 (two) times daily. 60 tablet 0  . felodipine (PLENDIL) 2.5 MG 24 hr tablet Take 1 tablet (2.5 mg total) by mouth daily. 30 tablet 0  . FLUoxetine (PROZAC) 10 MG tablet Take 10 mg by mouth daily after lunch.     . hydrocortisone (PROCTOSOL HC) 2.5 % rectal cream Place 1 application rectally daily as needed for hemorrhoids or itching.    Marland Kitchen  labetalol (NORMODYNE) 200 MG tablet Take 200 mg by mouth See admin instructions. Take 1 tablet (200 mg) by mouth every morning and 1 tablet (200 mg) after supper, may take an extra 1/2 tablet (100 mg) by mouth if BP will not come down.    . meclizine (ANTIVERT) 25 MG tablet Take 25 mg by mouth daily as needed for dizziness.     . Menthol, Topical Analgesic, (BENGAY EX) Apply 1 application topically at bedtime as needed (neck pain).    . metoprolol tartrate (LOPRESSOR) 25 MG tablet Take 1 tablet (25 mg total) by mouth at bedtime. For tachypalpitations 10 tablet 0  . potassium chloride SA (K-DUR,KLOR-CON) 20 MEQ tablet Take 10 mEq by mouth daily after lunch.    . pravastatin (PRAVACHOL) 40 MG  tablet Take 1 tablet (40 mg total) by mouth daily at 6 PM. 60 tablet 0  . XARELTO 20 MG TABS tablet TAKE ONE TABLET BY MOUTH ONCE DAILY (Patient taking differently: TAKE ONE TABLET BY MOUTH ONCE DAILY WITH SUPPER) 90 tablet 0  . zolpidem (AMBIEN) 10 MG tablet Take 5 mg by mouth at bedtime.  2   No current facility-administered medications for this encounter.    Allergies  Allergen Reactions  . Latex Itching  . Carbamazepine Other (See Comments)    Flushed blood pressure medication out of system  . Codeine Nausea And Vomiting    Nausea and vomiting  . Hydrochlorothiazide Other (See Comments)    Unknown allergic reaction  . Penicillins Hives    Has patient had a PCN reaction causing immediate rash, facial/tongue/throat swelling, SOB or lightheadedness with hypotension: Yes Has patient had a PCN reaction causing severe rash involving mucus membranes or skin necrosis: No Has patient had a PCN reaction that required hospitalization No Has patient had a PCN reaction occurring within the last 10 years: No If all of the above answers are "NO", then may proceed with Cephalosporin use.   . Sulfonamide Derivatives Hives    Hives     Social History   Social History  . Marital Status: Married    Spouse Name: N/A  . Number of Children: N/A  . Years of Education: N/A   Occupational History  . Retired    Social History Main Topics  . Smoking status: Never Smoker   . Smokeless tobacco: Never Used  . Alcohol Use: No  . Drug Use: No  . Sexual Activity: Yes   Other Topics Concern  . Not on file   Social History Narrative    Family History  Problem Relation Age of Onset  . Heart attack Father   . Heart disease Father     ROS- All systems are reviewed and negative except as per the HPI above  Physical Exam: Filed Vitals:   07/30/15 1105  BP: 142/96  Pulse: 82  Height: 5\' 5"  (1.651 m)  Weight: 181 lb 3.2 oz (82.192 kg)    GEN- The patient is well appearing, alert and  oriented x 3 today.   Head- normocephalic, atraumatic Eyes-  Sclera clear, conjunctiva pink Ears- hearing intact Oropharynx- clear Neck- supple, no JVP Lymph- no cervical lymphadenopathy Lungs- Clear to ausculation bilaterally, normal work of breathing Heart- Irregular rate and rhythm, no murmurs, rubs or gallops, PMI not laterally displaced GI- soft, NT, ND, + BS Extremities- no clubbing, cyanosis, or edema MS- no significant deformity or atrophy Skin- no rash or lesion Psych- euthymic mood, full affect Neuro- strength and sensation are intact  EKG-  afib v rater 82 bpm, qrs int 90 ms, qtc int 441 ms, LAFB Epic records reviewed   Assessment and Plan: 1. Persistent afib, asymptomatic Tolerating with v rates controlled Will add 25 mg metoprolol tartrate  in the pm, that is when she feels irregular heart beat. Continue labetolol. Cardioversion discussed, pt would like to avoid for now. AAD's discussed and she does not want additional meds.   2. HTN Appears to be controlled.  3. Anxiety Reassured her re afib and when going to ER/calling EMS would be appropriate.  F/u in afib clinic 10 days. F/u with Chanetta Marshall as scheduled  09/08/15   Geroge Baseman. Taleeya Blondin, [facility] [address] [place], [place] [address] [phone]

## 2015-07-30 NOTE — Patient Instructions (Signed)
Your physician has recommended you make the following change in your medication:  1)Metoprolol 25mg  once daily at bedtime

## 2015-08-04 ENCOUNTER — Other Ambulatory Visit: Payer: Self-pay | Admitting: Internal Medicine

## 2015-08-09 ENCOUNTER — Inpatient Hospital Stay (HOSPITAL_COMMUNITY): Admission: RE | Admit: 2015-08-09 | Payer: Medicare Other | Source: Ambulatory Visit | Admitting: Nurse Practitioner

## 2015-08-12 ENCOUNTER — Other Ambulatory Visit: Payer: Self-pay | Admitting: Internal Medicine

## 2015-08-17 ENCOUNTER — Encounter: Payer: Self-pay | Admitting: Nurse Practitioner

## 2015-08-17 NOTE — Progress Notes (Signed)
Electrophysiology Office Note Date: 08/18/2015  ID:  Tiffany Black, DOB [birth year], MRN [id number]  PCP: [name], MD Primary Cardiologist: Caryl Comes  CC: atrial fibrillation follow up  Tiffany Black is a 81 y.o. female seen today for Dr Caryl Comes.  She presents today for AF follow up. She was last seen by Dr Caryl Comes in June of this year and has been seen in the AF clinic several times since.  Her symptoms of atrial fibrillation do not always correspond with her actual rhythm.  At her last visit with AF clinic, she was in AF but asymptomatic and did not wish to pursue rhythm control with cardioversion or AAD therapy at that time.  Since last being seen in AF clinic, the patient reports doing reasonably well. She reports that her heart rates have been elevated at home and she has increased fatigue and shortness of breath with exertion. She was admitted this summer with symptoms of dizziness and vertigo and was found to have a stroke incidentally that neurology did not feel corresponded with presentation. Neurology recommended low dose ASA in conjunction with [place] for stroke prevention. She has chronic complaints of left sided numbness.  She denies chest pain, dyspnea, PND, orthopnea, nausea, vomiting, dizziness, syncope, edema, weight gain, or early satiety.  Past Medical History  Diagnosis Date  . Hypertension   . Arthritis   . PAF (paroxysmal atrial fibrillation) ([place])   . Endometrial ca ([place])   . Anxiety   . HLD (hyperlipidemia)   . Cardiomegaly   . Pectus excavatum   . OA (osteoarthritis)   . Vertigo   . Hx of echocardiogram     a. Echo (11/15): Mild LVH, EF 55-60%, normal wall motion, grade 2 diastolic dysfunction, mild LAE, normal RV function, PASP 29 mm Hg  . Stroke (cerebrum) [facility])    Past Surgical History  Procedure Laterality Date  . Cesarean section  1975  . Laparoscopic hysterectomy  2011  . Cataract extraction, bilateral Bilateral 2009  . Abdominal hysterectomy  2011     Current Outpatient Prescriptions  Medication Sig Dispense Refill  . acetaminophen (TYLENOL) 325 MG tablet Take 650 mg by mouth at bedtime as needed for headache (pain).     Marland Kitchen aspirin EC 81 MG EC tablet Take 1 tablet (81 mg total) by mouth daily. 30 tablet 0  . cholecalciferol (VITAMIN D) 1000 UNITS tablet Take 1,000 Units by mouth daily after lunch.     . DIOVAN 160 MG tablet Take 0.5 tablets (80 mg total) by mouth 2 (two) times daily. 60 tablet 0  . felodipine (PLENDIL) 5 MG 24 hr tablet Take 2.5 mg by mouth 2 (two) times daily.  5  . FLUoxetine (PROZAC) 10 MG tablet Take 10 mg by mouth daily after lunch.     . hydrocortisone (PROCTOSOL HC) 2.5 % rectal cream Place 1 application rectally daily as needed for hemorrhoids or itching.    . labetalol (NORMODYNE) 200 MG tablet Take 200 mg by mouth See admin instructions. Take 1 tablet (200 mg) by mouth every morning and 1 tablet (200 mg) after supper, may take an extra 1/2 tablet (100 mg) by mouth if BP will not come down.    . meclizine (ANTIVERT) 25 MG tablet Take 25 mg by mouth daily as needed for dizziness.     . Menthol, Topical Analgesic, (BENGAY EX) Apply 1 application topically at bedtime as needed (neck pain).    . potassium chloride SA (K-DUR,KLOR-CON) 20 MEQ tablet Take  10 mEq by mouth daily after lunch.    Alveda Reasons 20 MG TABS tablet TAKE ONE TABLET BY MOUTH ONCE DAILY 90 tablet 1  . zolpidem (AMBIEN) 10 MG tablet Take 5 mg by mouth at bedtime.  2  . metoprolol (LOPRESSOR) 50 MG tablet Take 1 tablet (50 mg total) by mouth 2 (two) times daily. 60 tablet 11   No current facility-administered medications for this visit.    Allergies:   Hydrochlorothiazide; Penicillins; Acetaminophen; Ciprofloxacin; Iodine-131; Latex; Carbamazepine; Codeine; Sulfamethoxazole; and Sulfonamide derivatives   Social History: Social History   Social History  . Marital Status: Married    Spouse Name: N/A  . Number of Children: N/A  . Years of  Education: N/A   Occupational History  . Retired    Social History Main Topics  . Smoking status: Never Smoker   . Smokeless tobacco: Never Used  . Alcohol Use: No  . Drug Use: No  . Sexual Activity: Yes   Other Topics Concern  . Not on file   Social History Narrative    Family History: Family History  Problem Relation Age of Onset  . Heart attack Father   . Heart disease Father   . Hypertension Neg Hx   . Stroke Maternal Uncle     Review of Systems: All other systems reviewed and are otherwise negative except as noted above.   Physical Exam: VS:  BP 130/87 mmHg  Pulse 96  Ht 5\' 5"  (1.651 m)  Wt 182 lb (82.555 kg)  BMI 30.29 kg/m2  SpO2 96% , BMI Body mass index is 30.29 kg/(m^2). Wt Readings from Last 3 Encounters:  08/18/15 182 lb (82.555 kg)  07/30/15 181 lb 3.2 oz (82.192 kg)  07/27/15 178 lb 3.2 oz (80.831 kg)    GEN- The patient is elderly appearing, alert and oriented x 3 today.   HEENT: normocephalic, atraumatic; sclera clear, conjunctiva pink; hearing intact; oropharynx clear; neck supple Lungs- Clear to ausculation bilaterally, normal work of breathing.  No wheezes, rales, rhonchi Heart- Irregular rate and rhythm GI- soft, non-tender, non-distended, bowel sounds present Extremities- no clubbing, cyanosis, or edema; DP/PT/radial pulses 2+ bilaterally MS- no significant deformity or atrophy Skin- warm and dry, no rash or lesion  Psych- euthymic mood, full affect Neuro- strength and sensation are intact   EKG:  EKG is not ordered today.  Recent Labs: 05/23/2015: ALT 14; Magnesium 2.0; TSH 4.811* 05/24/2015: BUN 14; Creatinine, Ser 0.93; Hemoglobin 12.7; Platelets 193; Potassium 3.6; Sodium 138    Other studies Reviewed: Additional studies/ records that were reviewed today include: multiple AF clinic notes, Dr Olin Pia office notes, hospital records  Assessment and Plan: 1.  Persistent atrial fibrillation She has been persistently in atrial  fibrillation for several weeks She still prefers to avoid rhythm control strategies Increase metoprolol to 50mg  bid today. If still in AF at follow up with Dr Caryl Comes, she is willing to consider cardioversion at that time.  Continue Xarelto for CHADS2VASC of 6 BMET, CBC 05/2015 normal  2.  Prior stroke 2/2 small vessel disease Neurology recommends concominant low dose ASA with [place] for stroke prevention No bleeding issues to date  3.  HTN Stable No change required today  Current medicines are reviewed at length with the patient today.   The patient does not have concerns regarding her medicines.  The following changes were made today:  none  Labs/ tests ordered today include: none   Disposition:   Follow up with Dr Caryl Comes in  January as scheduled.    Signed, Chanetta Marshall, NP 08/18/2015 11:57 AM   [facility] [address] [place] [address] [phone] (office) [phone] (fax)

## 2015-08-18 ENCOUNTER — Ambulatory Visit (INDEPENDENT_AMBULATORY_CARE_PROVIDER_SITE_OTHER): Payer: Medicare Other | Admitting: Nurse Practitioner

## 2015-08-18 ENCOUNTER — Encounter: Payer: Self-pay | Admitting: Nurse Practitioner

## 2015-08-18 VITALS — BP 130/87 | HR 96 | Ht 65.0 in | Wt 182.0 lb

## 2015-08-18 DIAGNOSIS — I10 Essential (primary) hypertension: Secondary | ICD-10-CM | POA: Diagnosis not present

## 2015-08-18 DIAGNOSIS — I480 Paroxysmal atrial fibrillation: Secondary | ICD-10-CM | POA: Diagnosis not present

## 2015-08-18 MED ORDER — METOPROLOL TARTRATE 50 MG PO TABS: 50.0000 mg | ORAL_TABLET | Freq: Two times a day (BID) | ORAL | Status: DC

## 2015-08-18 NOTE — Patient Instructions (Addendum)
Medication Instructions:   START TAKING METOPROLOL 50 MG TWICE A DAY     If you need a refill on your cardiac medications before your next appointment, please call your pharmacy.  Labwork: NONE ORDER TODAY    Testing/Procedures: NONE ORDER TODAY    Follow-Up:  WITH [facility] AS SCHEDULED   Any Other Special Instructions Will Be Listed Below (If Applicable).

## 2015-09-08 ENCOUNTER — Ambulatory Visit: Payer: Medicare Other | Admitting: Nurse Practitioner

## 2015-09-14 ENCOUNTER — Ambulatory Visit (INDEPENDENT_AMBULATORY_CARE_PROVIDER_SITE_OTHER): Payer: Medicare Other | Admitting: Internal Medicine

## 2015-09-14 ENCOUNTER — Encounter: Payer: Self-pay | Admitting: Internal Medicine

## 2015-09-14 VITALS — BP 144/90 | HR 97 | Ht 65.0 in | Wt 181.6 lb

## 2015-09-14 DIAGNOSIS — I480 Paroxysmal atrial fibrillation: Secondary | ICD-10-CM

## 2015-09-14 MED ORDER — METOPROLOL TARTRATE 50 MG PO TABS: 50.0000 mg | ORAL_TABLET | Freq: Two times a day (BID) | ORAL | Status: DC

## 2015-09-14 NOTE — Patient Instructions (Addendum)
Medication Instructions: 1) Increase metoprolol tartrate (lopressor) to 50 mg one tablet by mouth twice daily  Labwork: - none  Procedures/Testing: - none  Follow-Up: - Your physician wants you to follow-up in: 3 months with Tiffany Marshall, NP for Tiffany Black. You will receive a reminder letter in the mail two months in advance. If you don't receive a letter, please call our office to schedule the follow-up appointment.  - Your physician wants you to follow-up in: 6 months with Tiffany Black. You will receive a reminder letter in the mail two months in advance. If you don't receive a letter, please call our office to schedule the follow-up appointment.  Any Additional Special Instructions Will Be Listed Below (If Applicable). - none

## 2015-09-14 NOTE — Progress Notes (Signed)
Patient Care Team: Harlan Stains, MD as PCP - General (Family Medicine)   HPI  Tiffany Black is a 82 y.o. female Seen in followup for atrial fibrillation . She also has a history of hypertension  She underwent an event recorder October 2013 by Dr. Shirlee More. He demonstrated symptoms with sinus rhythm and symptoms with PACs and symptoms with a 3 beat run of atrial tachycardia.     She's also had 2 episodes December 15 February 16 wearing she has had tachycardia palpitations associated with lightheadedness and fatigue. She has ECG monitoring which were reviewed. These demonstrated an atypical atrial flutter with rapid conduction    Echo 10/15 demonstrated normal LV function and mild LVH and left atrial enlargement   She had atrial fibrillation apparently documented at [facility] with Dr. Rosita Fire.  We dont have documentation    Her husband of 55 years died in November 26, 2022. She and her son are doing pretty well  Past Medical History  Diagnosis Date  . Hypertension   . Arthritis   . PAF (paroxysmal atrial fibrillation) ([place])   . Endometrial ca ([place])   . Anxiety   . HLD (hyperlipidemia)   . Cardiomegaly   . Pectus excavatum   . OA (osteoarthritis)   . Vertigo   . Hx of echocardiogram     a. Echo (11/15): Mild LVH, EF 55-60%, normal wall motion, grade 2 diastolic dysfunction, mild LAE, normal RV function, PASP 29 mm Hg  . Stroke (cerebrum) [facility])     Past Surgical History  Procedure Laterality Date  . Cesarean section  1975  . Laparoscopic hysterectomy  2011  . Cataract extraction, bilateral Bilateral 2009  . Abdominal hysterectomy  2011    Current Outpatient Prescriptions  Medication Sig Dispense Refill  . acetaminophen (TYLENOL) 325 MG tablet Take 650 mg by mouth at bedtime as needed for headache (pain).     Marland Kitchen aspirin EC 81 MG EC tablet Take 1 tablet (81 mg total) by mouth daily. 30 tablet 0  . cholecalciferol (VITAMIN D) 1000 UNITS tablet Take 1,000 Units by mouth  daily after lunch.     . DIOVAN 160 MG tablet Take 0.5 tablets (80 mg total) by mouth 2 (two) times daily. 60 tablet 0  . felodipine (PLENDIL) 5 MG 24 hr tablet Take 2.5 mg by mouth 2 (two) times daily.  5  . FLUoxetine (PROZAC) 10 MG tablet Take 10 mg by mouth daily after lunch.     . hydrocortisone (PROCTOSOL HC) 2.5 % rectal cream Place 1 application rectally daily as needed for hemorrhoids or itching.    . labetalol (NORMODYNE) 200 MG tablet Take 200 mg by mouth See admin instructions. Take 1 tablet (200 mg) by mouth every morning and 1 tablet (200 mg) after supper, may take an extra 1/2 tablet (100 mg) by mouth if BP will not come down.    . meclizine (ANTIVERT) 25 MG tablet Take 25 mg by mouth daily as needed for dizziness.     . Menthol, Topical Analgesic, (BENGAY EX) Apply 1 application topically at bedtime as needed (neck pain).    . metoprolol tartrate (LOPRESSOR) 25 MG tablet Take one (1) tablet (25 mg total) by mouth once daily as needed for tachypalpitations.    . potassium chloride SA (K-DUR,KLOR-CON) 20 MEQ tablet Take 10 mEq by mouth daily after lunch.    Alveda Reasons 20 MG TABS tablet TAKE ONE TABLET BY MOUTH ONCE DAILY 90 tablet 1  .  zolpidem (AMBIEN) 10 MG tablet Take 5 mg by mouth at bedtime.  2   No current facility-administered medications for this visit.    Allergies  Allergen Reactions  . Hydrochlorothiazide Other (See Comments) and Anaphylaxis    Unknown allergic reaction  . Penicillins Hives and Anaphylaxis    Has patient had a PCN reaction causing immediate rash, facial/tongue/throat swelling, SOB or lightheadedness with hypotension: Yes Has patient had a PCN reaction causing severe rash involving mucus membranes or skin necrosis: No Has patient had a PCN reaction that required hospitalization No Has patient had a PCN reaction occurring within the last 10 years: No If all of the above answers are "NO", then may proceed with Cephalosporin use.   . Acetaminophen Other  (See Comments)    unknown  . Ciprofloxacin Other (See Comments)    Blood pressure issues  . Hydrocodone-Acetaminophen Other (See Comments)    unknown  . Iodine-131 Other (See Comments)    Raises blood pressure  . Latex Itching  . Carbamazepine Other (See Comments) and Rash    Flushed blood pressure medication out of system  . Codeine Nausea And Vomiting and Nausea Only    Nausea and vomiting  . Sulfamethoxazole Rash  . Sulfonamide Derivatives Hives    Hives     Review of Systems negative except from HPI and PMH  Physical Exam BP 144/90 mmHg  Pulse 97  Ht 5\' 5"  (1.651 m)  Wt 181 lb 9.6 oz (82.373 kg)  BMI 30.22 kg/m2 Well developed and nourished in no acute distress HENT normal Neck supple with JVP-flat Clear Irregularly irregular rate and rhythm, no murmurs or gallops Abd-soft with active BS No Clubbing cyanosis edema Skin-warm and dry A & Oriented  Grossly normal sensory and motor function  ECG dated today demonstrates atrial fibrillation/flutter at 93  intervals-/09/33 Axis left -59  Assessment and  Plan  Dizziness-orthostatic  Hypertension  Atrial fibrillation -intercurrent atrial arrhythmias  Dizziness has been much better     We'll continue her on Rivaroxaban indefinitely given her history of atrial fibrillation and risk factors; She is tolerating it well. She has had intercurrent tachypalpitations.  In disagreement with the recommendation from neurology, I've asked her to stop her aspirin there re  no data of which I'm aware whereby aspirin augments benefit for thromboembolic risk reduction in atrial fibrillation.  Surprisingly, she is tolerating her atrial fibrillation well as long for rate control is adequate. The formulation of metoprolol that she was given most recently did not agree with her. We will try to have her resume her other formulation of metoprolol tartrate and increase it from 25--50 twice a day to augment rate control  This will also  help with blood pressure

## 2015-09-15 ENCOUNTER — Telehealth: Payer: Self-pay | Admitting: Internal Medicine

## 2015-09-15 DIAGNOSIS — I480 Paroxysmal atrial fibrillation: Secondary | ICD-10-CM

## 2015-09-15 NOTE — Telephone Encounter (Signed)
New message    Patient was seen in the office on yesterday.     Pt c/o BP issue: STAT if pt c/o blurred vision, one-sided weakness or slurred speech  1. What are your last 5 BP readings? Today  99/80 pulse 96   2. Are you having any other symptoms (ex. Dizziness, headache, blurred vision, passed out)?  No   3. What is your BP issue? Discuss with nurse ... Dr. Caryl Comes told patient he wanted to do a urine test on her - checking on order. & she never did shot for his heart.

## 2015-09-15 NOTE — Telephone Encounter (Signed)
I left a message for the patient to call. 

## 2015-09-17 NOTE — Telephone Encounter (Signed)
I called and spoke with the patient. She states she thought Dr. Caryl Comes mentioned something about doing a test on her kidneys- I advised I did not see this mentioned in Dr. Olin Pia office note, but I will review with him to make sure she didn't need a blood draw or urine study done. I inquired about her BP- she states this was 143/86 earlier today with a heart rate of 88. She states her BP and HR are going up and down. She felt like she couldn't tolerate the higher dose of metoprolol at 50 mg BID. She is back on 25 mg BID. I advised her that she could try taking metoprolol 25 mg in the AM and 50 mg in the PM to see how this will help control her BP/ HR. I will call her back next week to follow up on any further testing that needs to be done.

## 2015-09-17 NOTE — Telephone Encounter (Signed)
Attempted to call the patient back- Busy x 2 tries.

## 2015-09-20 NOTE — Telephone Encounter (Signed)
Please check Cr for Rivaroxaban dosing

## 2015-09-21 NOTE — Telephone Encounter (Signed)
I spoke with the patient. She is aware of Dr. Olin Pia recommendations to have a BMP done. She states she has had 2 mild strokes on Xarelto and questioned if she needed to switch to something different. I advised her to come for labs first then we can determine what needs to be done. She will come on 09/27/15 for labs.

## 2015-09-23 ENCOUNTER — Other Ambulatory Visit (INDEPENDENT_AMBULATORY_CARE_PROVIDER_SITE_OTHER): Payer: Medicare Other | Admitting: *Deleted

## 2015-09-23 DIAGNOSIS — I480 Paroxysmal atrial fibrillation: Secondary | ICD-10-CM | POA: Diagnosis not present

## 2015-09-23 LAB — BASIC METABOLIC PANEL
BUN: 12 mg/dL (ref 7–25)
CHLORIDE: 103 mmol/L (ref 98–110)
CO2: 26 mmol/L (ref 20–31)
Calcium: 9.4 mg/dL (ref 8.6–10.4)
Creat: 0.86 mg/dL (ref 0.60–0.93)
Glucose, Bld: 87 mg/dL (ref 65–99)
POTASSIUM: 4.3 mmol/L (ref 3.5–5.3)
Sodium: 137 mmol/L (ref 135–146)

## 2015-09-23 NOTE — Addendum Note (Signed)
Addended by: Eulis Foster on: 09/23/2015 09:20 AM   Modules accepted: Orders

## 2015-09-24 ENCOUNTER — Telehealth: Payer: Self-pay | Admitting: Internal Medicine

## 2015-09-24 NOTE — Telephone Encounter (Signed)
Walk in pt form-pt has question regarding meds-heather back Monday 1/16

## 2015-09-27 ENCOUNTER — Other Ambulatory Visit: Payer: Medicare Other

## 2015-09-28 ENCOUNTER — Telehealth: Payer: Self-pay

## 2015-09-28 NOTE — Telephone Encounter (Signed)
Xarelto approved through 09/10/2016. [place] [id number].

## 2015-09-28 NOTE — Telephone Encounter (Signed)
Prior auth for Xarelto 20mg sent to [facility]. 

## 2015-10-25 ENCOUNTER — Telehealth: Payer: Self-pay | Admitting: Internal Medicine

## 2015-10-25 NOTE — Telephone Encounter (Signed)
New Message  Pt c/o BP issue:  1. What are your last 5 BP readings? 106/79 pulse 100 2. Are you having any other symptoms (ex. Dizziness, headache, blurred vision, passed out)? Droopy doesn't feel like doing anything  3. What is your medication issue? Pt is sure that she took the medication correctly. Pt states that this is a different reading than normal and would like a call back to discuss.

## 2015-10-25 NOTE — Telephone Encounter (Signed)
I called and spoke with the patient.  She states she is having some flucuations with her BP/ HR's. She stated Dr. Caryl Comes and her PCP do not want to manage her BP- she is seen a [facility] HTN clinic for this. She is trying to get an appointment, but has some transportation issues getting there. She thinks lately her BP has been low and her pulse has been typically in the 80's. She reports that if her HR is in the 80's she will take a 1/2 tablet of metoprolol, but is nearing 100, she will take a full tablet of metoprolol. She had a BP of 107/77 HR- 89 this morning.  She was feeling a little dizzy, but took a meclizine to see if this would help and it seemed to help her enough to get up and do what she needed to do. Her BP has been from 96-160/86-103 lately.  Her HR's have been as low as 70, but will average around 80 bpm typically. Medications were verified with the patient. Her only real complaint is fatigue at this time. I advised her I will review with Dr. Caryl Comes and call her back tomorrow. She is agreeable.

## 2015-10-26 NOTE — Telephone Encounter (Signed)
Labile BP is very difficult problem  The goal may be to keep it from getting too low or way too high, and to keep her from getting LH/dizzy from too low or too high  Adjusting how much she takes is a great idea but if her BP is <100 I would suggest nothing 100-130 take 1/2 >130 take whole tablet of metoprolol

## 2015-10-26 NOTE — Telephone Encounter (Signed)
I spoke with the patient. She is aware of Dr. Olin Pia recommendations. She is currently sitting in Dr. Orest Dikes office for rountine follow up.

## 2015-11-09 ENCOUNTER — Encounter (HOSPITAL_COMMUNITY): Payer: Self-pay | Admitting: Emergency Medicine

## 2015-11-09 ENCOUNTER — Emergency Department (HOSPITAL_COMMUNITY)
Admission: EM | Admit: 2015-11-09 | Discharge: 2015-11-09 | Disposition: A | Discharge status: Home / Self Care | Payer: Medicare Other | Attending: Emergency Medicine | Admitting: Emergency Medicine

## 2015-11-09 DIAGNOSIS — Z880 Allergy status to penicillin: Secondary | ICD-10-CM | POA: Insufficient documentation

## 2015-11-09 DIAGNOSIS — Z8542 Personal history of malignant neoplasm of other parts of uterus: Secondary | ICD-10-CM | POA: Diagnosis not present

## 2015-11-09 DIAGNOSIS — Z7982 Long term (current) use of aspirin: Secondary | ICD-10-CM | POA: Diagnosis not present

## 2015-11-09 DIAGNOSIS — I10 Essential (primary) hypertension: Secondary | ICD-10-CM | POA: Insufficient documentation

## 2015-11-09 DIAGNOSIS — T23132A Burn of first degree of multiple left fingers (nail), not including thumb, initial encounter: Secondary | ICD-10-CM | POA: Insufficient documentation

## 2015-11-09 DIAGNOSIS — Z8673 Personal history of transient ischemic attack (TIA), and cerebral infarction without residual deficits: Secondary | ICD-10-CM | POA: Diagnosis not present

## 2015-11-09 DIAGNOSIS — Y998 Other external cause status: Secondary | ICD-10-CM | POA: Insufficient documentation

## 2015-11-09 DIAGNOSIS — X150XXA Contact with hot stove (kitchen), initial encounter: Secondary | ICD-10-CM | POA: Diagnosis not present

## 2015-11-09 DIAGNOSIS — Z8639 Personal history of other endocrine, nutritional and metabolic disease: Secondary | ICD-10-CM | POA: Diagnosis not present

## 2015-11-09 DIAGNOSIS — M1990 Unspecified osteoarthritis, unspecified site: Secondary | ICD-10-CM | POA: Diagnosis not present

## 2015-11-09 DIAGNOSIS — Z91040 Latex allergy status: Secondary | ICD-10-CM | POA: Insufficient documentation

## 2015-11-09 DIAGNOSIS — Q676 Pectus excavatum: Secondary | ICD-10-CM | POA: Insufficient documentation

## 2015-11-09 DIAGNOSIS — Y9389 Activity, other specified: Secondary | ICD-10-CM | POA: Insufficient documentation

## 2015-11-09 DIAGNOSIS — I480 Paroxysmal atrial fibrillation: Secondary | ICD-10-CM | POA: Diagnosis not present

## 2015-11-09 DIAGNOSIS — Y9289 Other specified places as the place of occurrence of the external cause: Secondary | ICD-10-CM | POA: Insufficient documentation

## 2015-11-09 DIAGNOSIS — F419 Anxiety disorder, unspecified: Secondary | ICD-10-CM | POA: Insufficient documentation

## 2015-11-09 DIAGNOSIS — T23002A Burn of unspecified degree of left hand, unspecified site, initial encounter: Secondary | ICD-10-CM | POA: Diagnosis present

## 2015-11-09 DIAGNOSIS — Z79899 Other long term (current) drug therapy: Secondary | ICD-10-CM | POA: Diagnosis not present

## 2015-11-09 DIAGNOSIS — T300 Burn of unspecified body region, unspecified degree: Secondary | ICD-10-CM

## 2015-11-09 NOTE — Discharge Instructions (Signed)
Burn Care °Your skin is a natural barrier to infection. It is the largest organ of your body. Burns damage this natural protection. To help prevent infection, it is very important to follow your caregiver's instructions in the care of your burn. °Burns are classified as: °· First degree. There is only redness of the skin (erythema). No scarring is expected. °· Second degree. There is blistering of the skin. Scarring may occur with deeper burns. °· Third degree. All layers of the skin are injured, and scarring is expected. °HOME CARE INSTRUCTIONS  °· Wash your hands well before changing your bandage. °· Change your bandage as often as directed by your caregiver. °¨ Remove the old bandage. If the bandage sticks, you may soak it off with cool, clean water. °¨ Cleanse the burn thoroughly but gently with mild soap and water. °¨ Pat the area dry with a clean, dry cloth. °¨ Apply a thin layer of antibacterial cream to the burn. °¨ Apply a clean bandage as instructed by your caregiver. °¨ Keep the bandage as clean and dry as possible. °· Elevate the affected area for the first 24 hours, then as instructed by your caregiver. °· Only take over-the-counter or prescription medicines for pain, discomfort, or fever as directed by your caregiver. °SEEK IMMEDIATE MEDICAL CARE IF:  °· You develop excessive pain. °· You develop redness, tenderness, swelling, or red streaks near the burn. °· The burned area develops yellowish-white fluid (pus) or a bad smell. °· You have a fever. °MAKE SURE YOU:  °· Understand these instructions. °· Will watch your condition. °· Will get help right away if you are not doing well or get worse. °  °This information is not intended to replace advice given to you by your health care provider. Make sure you discuss any questions you have with your health care provider. °  °Document Released: 08/28/2005 Document Revised: 11/20/2011 Document Reviewed: 01/18/2011 °Elsevier Interactive Patient Education ©2016  [employer]vier Inc[employer] ° °

## 2015-11-09 NOTE — ED Notes (Signed)
Sat left hand down on stove eye that was on. No blisters observed, redness to finger tips. Able to move fingers without complication.

## 2015-11-09 NOTE — ED Provider Notes (Signed)
CSN: [id number]     Arrival date & time 11/09/15  1654 History  By signing my name below, I, Altamease Oiler, attest that this documentation has been prepared under the direction and in the presence of [employer]. Electronically Signed: Altamease Oiler, ED Scribe. 11/09/2015 7:10 PM  Chief Complaint  Patient presents with  . Burn    The history is provided by the patient. No language interpreter was used.   Tiffany Black is a 82 y.o. female with history of HTN who presents to the Emergency Department complaining of a burn at the fingers of the left hand with onset this evening. Pt states that she didn't realize that the stove was on when she touched a hot eye. She rinsed the hand with cold water and notes that the pain has improved. No history of DM. Pt states that her HTN is well controlled. No loss of distal sensation strength or motor function. No blisters reported.  Past Medical History  Diagnosis Date  . Hypertension   . Arthritis   . PAF (paroxysmal atrial fibrillation) ([place])   . Endometrial ca ([place])   . Anxiety   . HLD (hyperlipidemia)   . Cardiomegaly   . Pectus excavatum   . OA (osteoarthritis)   . Vertigo   . Hx of echocardiogram     a. Echo (11/15): Mild LVH, EF 55-60%, normal wall motion, grade 2 diastolic dysfunction, mild LAE, normal RV function, PASP 29 mm Hg  . Stroke (cerebrum) [facility])    Past Surgical History  Procedure Laterality Date  . Cesarean section  1975  . Laparoscopic hysterectomy  2011  . Cataract extraction, bilateral Bilateral 2009  . Abdominal hysterectomy  2011   Family History  Problem Relation Age of Onset  . Heart attack Father   . Heart disease Father   . Hypertension Neg Hx   . Stroke Maternal Uncle    Social History  Substance Use Topics  . Smoking status: Never Smoker   . Smokeless tobacco: Never Used  . Alcohol Use: No   OB History    No data available     Review of Systems  All other systems reviewed and are  negative.  Allergies  Hydrochlorothiazide; Penicillins; Acetaminophen; Ciprofloxacin; Hydrocodone-acetaminophen; Iodine-131; Latex; Carbamazepine; Codeine; Sulfamethoxazole; and Sulfonamide derivatives  Home Medications   Prior to Admission medications   Medication Sig Start Date End Date Taking? Authorizing Provider  acetaminophen (TYLENOL) 325 MG tablet Take 650 mg by mouth at bedtime as needed for headache (pain).     Historical Provider, MD  aspirin EC 81 MG EC tablet Take 1 tablet (81 mg total) by mouth daily. 05/24/15   Bonnielee Haff, MD  cholecalciferol (VITAMIN D) 1000 UNITS tablet Take 1,000 Units by mouth daily after lunch.     Historical Provider, MD  DIOVAN 160 MG tablet Take 0.5 tablets (80 mg total) by mouth 2 (two) times daily. 05/24/15   Bonnielee Haff, MD  felodipine (PLENDIL) 5 MG 24 hr tablet Take 2.5 mg by mouth 2 (two) times daily. 07/31/15   Historical Provider, MD  FLUoxetine (PROZAC) 10 MG tablet Take 10 mg by mouth daily after lunch.     Historical Provider, MD  hydrocortisone (PROCTOSOL HC) 2.5 % rectal cream Place 1 application rectally daily as needed for hemorrhoids or itching.    Historical Provider, MD  labetalol (NORMODYNE) 200 MG tablet Take 200 mg by mouth See admin instructions. Take 1 tablet (200 mg) by mouth every morning and 1  tablet (200 mg) after supper, may take an extra 1/2 tablet (100 mg) by mouth if BP will not come down.    Historical Provider, MD  meclizine (ANTIVERT) 25 MG tablet Take 25 mg by mouth daily as needed for dizziness.     Historical Provider, MD  Menthol, Topical Analgesic, (BENGAY EX) Apply 1 application topically at bedtime as needed (neck pain).    Historical Provider, MD  metoprolol (LOPRESSOR) 50 MG tablet Take 1/2 tablet (25 mg) by mouth twice daily    Historical Provider, MD  potassium chloride SA (K-DUR,KLOR-CON) 20 MEQ tablet Take 10 mEq by mouth daily after lunch.    Historical Provider, MD  XARELTO 20 MG TABS tablet TAKE ONE  TABLET BY MOUTH ONCE DAILY 08/12/15   Deboraha Sprang, MD  zolpidem (AMBIEN) 10 MG tablet Take 5 mg by mouth at bedtime. 05/03/15   Historical Provider, MD   BP 154/87 mmHg  Pulse 88  Temp(Src) 98.7 F (37.1 C) (Oral)  Resp 18  SpO2 98%  Physical Exam  Constitutional: She is oriented to person, place, and time. She appears well-developed and well-nourished. No distress.  HENT:  Head: Normocephalic and atraumatic.  Eyes: Conjunctivae and EOM are normal.  Neck: Neck supple. No tracheal deviation present.  Cardiovascular: Normal rate.   Pulmonary/Chest: Effort normal. No respiratory distress.  Musculoskeletal: Normal range of motion.  Neurological: She is alert and oriented to person, place, and time.  Skin: Skin is warm and dry.  Superficial burns to the  palmar aspect of the left 2nd-3rd fingers. No open blisters. No signs of deep involvement.  Psychiatric: She has a normal mood and affect. Her behavior is normal.  Nursing note and vitals reviewed.     ED Course  Procedures (including critical care time) DIAGNOSTIC STUDIES: Oxygen Saturation is 98% on RA,  normal by my interpretation.    COORDINATION OF CARE: 7:02 PM Discussed treatment plan with pt at bedside and pt agreed to plan.  Labs Review Labs Reviewed - No data to display  Imaging Review No results found.    EKG Interpretation None      MDM   Final diagnoses:  Burn    Labs:   Imaging:  Consults:  Therapeutics:  Discharge Meds:   Assessment/Plan: 82 year old female presents today with burn her hand. No decreased range of motion, loss of sensation, signs of deep or circumferential burn. She has no blisters at this time, although I expect some deformed. Patient is allergic to sulfonamides which makes Silvadene and inappropriate therapy. Patient will be instructed to continue using acid trace in, monitor for signs of infection and return immediately if they present.  Patient is instructed to follow-up  in 2-3 days with her primary care for reevaluation.   I personally performed the services described in this documentation, which was scribed in my presence. The recorded information has been reviewed and is accurate.   Okey Regal, PA-C 11/12/15 1332  Charlesetta Shanks, MD 11/12/15 [phone]

## 2015-11-16 ENCOUNTER — Encounter: Payer: Self-pay | Admitting: Nurse Practitioner

## 2016-01-12 NOTE — Progress Notes (Signed)
Electrophysiology Office Note Date: 01/13/2016  ID:  Tiffany Black, DOB 12/09/1933, MRN [id number]  PCP: [name], MD Primary Cardiologist: Caryl Comes  CC: atrial fibrillation follow up  Tiffany Black is a 82 y.o. female seen today for Dr Caryl Comes.  She presents today for AF follow up. She was last seen by Dr Caryl Comes in January of this year at which time she was doing well symptomatically in atrial fibrillation. She was admitted the summer of 2016 with symptoms of dizziness and vertigo and was found to have a stroke incidentally that neurology did not feel corresponded with presentation. Neurology recommended low dose ASA in conjunction with [place] for stroke prevention.  Dr Caryl Comes recommended that she discontinue her ASA at visit with him in January. She has chronic left neck pain. She remains active working outside in her yard without symptoms.  She denies chest pain, dyspnea, PND, orthopnea, nausea, vomiting, dizziness, syncope, edema, weight gain, or early satiety.  Past Medical History  Diagnosis Date  . Hypertension   . Arthritis   . PAF (paroxysmal atrial fibrillation) ([place])   . Endometrial ca ([place])   . Anxiety   . HLD (hyperlipidemia)   . Cardiomegaly   . Pectus excavatum   . OA (osteoarthritis)   . Vertigo   . Hx of echocardiogram     a. Echo (11/15): Mild LVH, EF 55-60%, normal wall motion, grade 2 diastolic dysfunction, mild LAE, normal RV function, PASP 29 mm Hg  . Stroke (cerebrum) [facility])    Past Surgical History  Procedure Laterality Date  . Cesarean section  1975  . Laparoscopic hysterectomy  2011  . Cataract extraction, bilateral Bilateral 2009  . Abdominal hysterectomy  2011    Current Outpatient Prescriptions  Medication Sig Dispense Refill  . acetaminophen (TYLENOL) 325 MG tablet Take 650 mg by mouth at bedtime as needed for headache (pain).     Marland Kitchen aspirin EC 81 MG EC tablet Take 1 tablet (81 mg total) by mouth daily. 30 tablet 0  . cholecalciferol (VITAMIN D)  1000 UNITS tablet Take 1,000 Units by mouth daily after lunch.     . DIOVAN 160 MG tablet Take 0.5 tablets (80 mg total) by mouth 2 (two) times daily. 60 tablet 0  . felodipine (PLENDIL) 5 MG 24 hr tablet Take 2.5 mg by mouth 2 (two) times daily.  5  . FLUoxetine (PROZAC) 10 MG tablet Take 10 mg by mouth daily after lunch.     . hydrocortisone (PROCTOSOL HC) 2.5 % rectal cream Place 1 application rectally daily as needed for hemorrhoids or itching.    . labetalol (NORMODYNE) 200 MG tablet Take 200 mg by mouth See admin instructions. Take 1 tablet (200 mg) by mouth every morning and 1 tablet (200 mg) after supper, may take an extra 1/2 tablet (100 mg) by mouth if BP will not come down.    . meclizine (ANTIVERT) 25 MG tablet Take 25 mg by mouth daily as needed for dizziness.     . Menthol, Topical Analgesic, (BENGAY EX) Apply 1 application topically at bedtime as needed (neck pain).    . metoprolol (LOPRESSOR) 50 MG tablet Take 1/2 tablet (25 mg) by mouth twice daily    . potassium chloride SA (K-DUR,KLOR-CON) 20 MEQ tablet Take 10 mEq by mouth daily after lunch.    Alveda Reasons 20 MG TABS tablet TAKE ONE TABLET BY MOUTH ONCE DAILY 90 tablet 1  . zolpidem (AMBIEN) 10 MG tablet Take 5 mg by  mouth at bedtime.  2   No current facility-administered medications for this visit.    Allergies:   Hydrochlorothiazide; Penicillins; Acetaminophen; Ciprofloxacin; Hydrocodone-acetaminophen; Iodine-131; Latex; Carbamazepine; Codeine; Sulfamethoxazole; and Sulfonamide derivatives   Social History: Social History   Social History  . Marital Status: Married    Spouse Name: N/A  . Number of Children: N/A  . Years of Education: N/A   Occupational History  . Retired    Social History Main Topics  . Smoking status: Never Smoker   . Smokeless tobacco: Never Used  . Alcohol Use: No  . Drug Use: No  . Sexual Activity: Yes   Other Topics Concern  . Not on file   Social History Narrative    Family  History: Family History  Problem Relation Age of Onset  . Heart attack Father   . Heart disease Father   . Hypertension Neg Hx   . Stroke Maternal Uncle     Review of Systems: All other systems reviewed and are otherwise negative except as noted above.   Physical Exam: VS:  BP 130/90 mmHg  Pulse 84  Ht 5\' 5"  (1.651 m) , BMI There is no weight on file to calculate BMI. Wt Readings from Last 3 Encounters:  09/14/15 181 lb 9.6 oz (82.373 kg)  08/18/15 182 lb (82.555 kg)  07/30/15 181 lb 3.2 oz (82.192 kg)    GEN- The patient is elderly appearing, alert and oriented x 3 today.   HEENT: normocephalic, atraumatic; sclera clear, conjunctiva pink; hearing intact; oropharynx clear; neck supple Lungs- Clear to ausculation bilaterally, normal work of breathing.  No wheezes, rales, rhonchi Heart- Irregular rate and rhythm GI- soft, non-tender, non-distended, bowel sounds present Extremities- no clubbing, cyanosis, or edema; DP/PT/radial pulses 2+ bilaterally MS- no significant deformity or atrophy Skin- warm and dry, no rash or lesion  Psych- euthymic mood, full affect Neuro- strength and sensation are intact   EKG:  EKG is not ordered today.  Recent Labs: 05/23/2015: ALT 14; Magnesium 2.0; TSH 4.811* 05/24/2015: Hemoglobin 12.7; Platelets 193 09/23/2015: BUN 12; Creat 0.86; Potassium 4.3; Sodium 137    Other studies Reviewed: Additional studies/ records that were reviewed today include: multiple AF clinic notes, Dr Olin Pia office notes, hospital records  Assessment and Plan: 1.  Permanent atrial fibrillation At last visit with Dr Caryl Comes, decision was made to continue with rate control and not attempt further rhythm control Continue Xarelto for CHADS2VASC of 6 BMET, CBC today  2.  Prior stroke 2/2 small vessel disease Neurology recommends concominant low dose ASA with [place] for stroke prevention ASA discontinued January 2017 per Dr Olin Pia recommendation No bleeding issues to  date  3.  HTN Stable No change required today  Current medicines are reviewed at length with the patient today.   The patient does not have concerns regarding her medicines.  The following changes were made today:  none  Labs/ tests ordered today include: CBC, BMET   Disposition:   Follow up with Dr Caryl Comes in 3 months as scheduled    Signed, Chanetta Marshall, NP 01/13/2016 10:31 AM   [place] [address] [phone] (office) [phone] (fax)

## 2016-01-13 ENCOUNTER — Encounter: Payer: Self-pay | Admitting: Nurse Practitioner

## 2016-01-13 ENCOUNTER — Ambulatory Visit (INDEPENDENT_AMBULATORY_CARE_PROVIDER_SITE_OTHER): Payer: Medicare Other | Admitting: Nurse Practitioner

## 2016-01-13 VITALS — BP 130/90 | HR 84 | Ht 65.0 in

## 2016-01-13 DIAGNOSIS — I10 Essential (primary) hypertension: Secondary | ICD-10-CM | POA: Diagnosis not present

## 2016-01-13 DIAGNOSIS — I4821 Permanent atrial fibrillation: Secondary | ICD-10-CM

## 2016-01-13 DIAGNOSIS — I482 Chronic atrial fibrillation: Secondary | ICD-10-CM | POA: Diagnosis not present

## 2016-01-13 LAB — CBC
HCT: 40.4 % (ref 35.0–45.0)
HEMOGLOBIN: 13.7 g/dL (ref 11.7–15.5)
MCH: 30 pg (ref 27.0–33.0)
MCHC: 33.9 g/dL (ref 32.0–36.0)
MCV: 88.4 fL (ref 80.0–100.0)
MPV: 10.5 fL (ref 7.5–12.5)
Platelets: 226 10*3/uL (ref 140–400)
RBC: 4.57 MIL/uL (ref 3.80–5.10)
RDW: 14.5 % (ref 11.0–15.0)
WBC: 6.7 10*3/uL (ref 3.8–10.8)

## 2016-01-13 LAB — BASIC METABOLIC PANEL
BUN: 18 mg/dL (ref 7–25)
CALCIUM: 9.2 mg/dL (ref 8.6–10.4)
CO2: 27 mmol/L (ref 20–31)
Chloride: 103 mmol/L (ref 98–110)
Creat: 0.81 mg/dL (ref 0.60–0.88)
GLUCOSE: 89 mg/dL (ref 65–99)
POTASSIUM: 4.3 mmol/L (ref 3.5–5.3)
SODIUM: 139 mmol/L (ref 135–146)

## 2016-01-13 NOTE — Patient Instructions (Addendum)
Medication Instructions:   Your physician recommends that you continue on your current medications as directed. Please refer to the Current Medication list given to you today.   If you need a refill on your cardiac medications before your next appointment, please call your pharmacy.  Labwork:  CBC AND BMET    Testing/Procedures: NONE ORDER TODAY    Follow-Up: WITH DR Caryl Comes AS SCHEDULED    Any Other Special Instructions Will Be Listed Below (If Applicable).

## 2016-01-17 ENCOUNTER — Telehealth: Payer: Self-pay | Admitting: *Deleted

## 2016-01-17 NOTE — Telephone Encounter (Signed)
-----   Message from Tiffany Berthold, NP sent at 01/13/2016  3:26 PM EDT ----- Please notify patient of stable labs

## 2016-01-19 ENCOUNTER — Ambulatory Visit: Payer: Medicare Other | Admitting: Nurse Practitioner

## 2016-02-07 ENCOUNTER — Emergency Department (HOSPITAL_COMMUNITY)
Admission: EM | Admit: 2016-02-07 | Discharge: 2016-02-07 | Disposition: A | Discharge status: Home / Self Care | Payer: Medicare Other | Attending: Emergency Medicine | Admitting: Emergency Medicine

## 2016-02-07 ENCOUNTER — Encounter (HOSPITAL_COMMUNITY): Payer: Self-pay

## 2016-02-07 DIAGNOSIS — M47812 Spondylosis without myelopathy or radiculopathy, cervical region: Secondary | ICD-10-CM

## 2016-02-07 DIAGNOSIS — Z8673 Personal history of transient ischemic attack (TIA), and cerebral infarction without residual deficits: Secondary | ICD-10-CM | POA: Insufficient documentation

## 2016-02-07 DIAGNOSIS — Z7901 Long term (current) use of anticoagulants: Secondary | ICD-10-CM | POA: Insufficient documentation

## 2016-02-07 DIAGNOSIS — Z91040 Latex allergy status: Secondary | ICD-10-CM | POA: Diagnosis not present

## 2016-02-07 DIAGNOSIS — M47892 Other spondylosis, cervical region: Secondary | ICD-10-CM | POA: Diagnosis not present

## 2016-02-07 DIAGNOSIS — Z79899 Other long term (current) drug therapy: Secondary | ICD-10-CM | POA: Diagnosis not present

## 2016-02-07 DIAGNOSIS — I10 Essential (primary) hypertension: Secondary | ICD-10-CM | POA: Insufficient documentation

## 2016-02-07 DIAGNOSIS — Z7982 Long term (current) use of aspirin: Secondary | ICD-10-CM | POA: Insufficient documentation

## 2016-02-07 DIAGNOSIS — M542 Cervicalgia: Secondary | ICD-10-CM | POA: Diagnosis present

## 2016-02-07 MED ORDER — TRAMADOL HCL 50 MG PO TABS: 50.0000 mg | ORAL_TABLET | Freq: Four times a day (QID) | ORAL | Status: DC | PRN

## 2016-02-07 NOTE — ED Notes (Signed)
Patient c/o left lateral neck pain and inside bilateral ears. Patient states she has had neck pain  For years, but the neck pain is not getting any better.

## 2016-02-07 NOTE — Discharge Instructions (Signed)
Call and make an appointment to follow-up with your neurosurgeon. Return immediately for worsening visual changes, weakness, numbness or for any concerns.

## 2016-02-07 NOTE — ED Provider Notes (Signed)
CSN: [id number]     Arrival date & time 02/07/16  1323 History   First MD Initiated Contact with Patient 02/07/16 1430     Chief Complaint  Patient presents with  . Neck Pain     (Consider location/radiation/quality/duration/timing/severity/associated sxs/prior Treatment) HPI Patient presents with chronic left sided neck pain for several years. She's been seen by a neurologist and neurosurgeon for this. Recently seen by Dr. Ellene Route last year. Had MRI that showed severe C2-C3 left-sided arthropathy. States her pain is worsened in the last 1-2 weeks. No trauma associated with this. Denies headaches. No focal weakness or numbness. Occasionally has blurred vision but denies this currently. No fever or chills. Past Medical History  Diagnosis Date  . Hypertension   . Arthritis   . PAF (paroxysmal atrial fibrillation) ([place])   . Endometrial ca ([place])   . Anxiety   . HLD (hyperlipidemia)   . Cardiomegaly   . Pectus excavatum   . OA (osteoarthritis)   . Vertigo   . Hx of echocardiogram     a. Echo (11/15): Mild LVH, EF 55-60%, normal wall motion, grade 2 diastolic dysfunction, mild LAE, normal RV function, PASP 29 mm Hg  . Stroke (cerebrum) [facility])    Past Surgical History  Procedure Laterality Date  . Cesarean section  1975  . Laparoscopic hysterectomy  2011  . Cataract extraction, bilateral Bilateral 2009  . Abdominal hysterectomy  2011   Family History  Problem Relation Age of Onset  . Heart attack Father   . Heart disease Father   . Hypertension Neg Hx   . Stroke Maternal Uncle    Social History  Substance Use Topics  . Smoking status: Never Smoker   . Smokeless tobacco: Never Used  . Alcohol Use: No   OB History    No data available     Review of Systems  Constitutional: Negative for fever and chills.  Eyes: Positive for visual disturbance.  Respiratory: Negative for shortness of breath.   Cardiovascular: Negative for chest pain.  Gastrointestinal: Negative for nausea,  vomiting and abdominal pain.  Musculoskeletal: Positive for neck pain. Negative for myalgias, back pain and neck stiffness.  Skin: Negative for rash and wound.  Neurological: Negative for dizziness, syncope, weakness, light-headedness, numbness and headaches.  All other systems reviewed and are negative.     Allergies  Hydrochlorothiazide; Penicillins; Ciprofloxacin; Hydrocodone-acetaminophen; Iodine-131; Latex; Carbamazepine; Codeine; Sulfamethoxazole; and Sulfonamide derivatives  Home Medications   Prior to Admission medications   Medication Sig Start Date End Date Taking? Authorizing Provider  acetaminophen (TYLENOL) 325 MG tablet Take 650 mg by mouth at bedtime as needed for headache (pain).    Yes Historical Provider, MD  cholecalciferol (VITAMIN D) 1000 UNITS tablet Take 1,000 Units by mouth daily after lunch.    Yes Historical Provider, MD  DIOVAN 160 MG tablet Take 0.5 tablets (80 mg total) by mouth 2 (two) times daily. 05/24/15  Yes Bonnielee Haff, MD  felodipine (PLENDIL) 5 MG 24 hr tablet Take 2.5 mg by mouth 2 (two) times daily. 07/31/15  Yes Historical Provider, MD  FLUoxetine (PROZAC) 10 MG tablet Take 10 mg by mouth daily after lunch.    Yes Historical Provider, MD  hydrocortisone (PROCTOSOL HC) 2.5 % rectal cream Place 1 application rectally daily as needed for hemorrhoids or itching.   Yes Historical Provider, MD  labetalol (NORMODYNE) 200 MG tablet Take 200-300 mg by mouth See admin instructions. Take 1 tablet (200 mg) by mouth every morning and 1.5  tablet (300 mg) after supper, may take an extra 1/2 tablet (100 mg) by mouth if BP will not come down.   Yes Historical Provider, MD  meclizine (ANTIVERT) 25 MG tablet Take 25 mg by mouth daily as needed for dizziness.    Yes Historical Provider, MD  Menthol, Topical Analgesic, (BENGAY EX) Apply 1 application topically at bedtime as needed (neck pain).   Yes Historical Provider, MD  metoprolol (LOPRESSOR) 50 MG tablet Take 1/2  tablet (25 mg) by mouth twice daily   Yes Historical Provider, MD  potassium chloride SA (K-DUR,KLOR-CON) 20 MEQ tablet Take 10 mEq by mouth daily after lunch.   Yes Historical Provider, MD  XARELTO 20 MG TABS tablet TAKE ONE TABLET BY MOUTH ONCE DAILY 08/12/15  Yes Deboraha Sprang, MD  zolpidem (AMBIEN) 10 MG tablet Take 5 mg by mouth at bedtime. 05/03/15  Yes Historical Provider, MD  aspirin EC 81 MG EC tablet Take 1 tablet (81 mg total) by mouth daily. Patient not taking: Reported on 02/07/2016 05/24/15   Bonnielee Haff, MD  traMADol (ULTRAM) 50 MG tablet Take 1 tablet (50 mg total) by mouth every 6 (six) hours as needed for severe pain. 02/07/16   Julianne Rice, MD   BP 149/90 mmHg  Pulse 85  Temp(Src) 98 F (36.7 C) (Oral)  Resp 16  SpO2 94% Physical Exam  Constitutional: She is oriented to person, place, and time. She appears well-developed and well-nourished. No distress.  HENT:  Head: Normocephalic and atraumatic.  Mouth/Throat: Oropharynx is clear and moist. No oropharyngeal exudate.  Eyes: EOM are normal. Pupils are equal, round, and reactive to light.  Neck: Normal range of motion. Neck supple.  Patient with minor tenderness to palpation just distal to the base of the skull on left. No obvious lesions. No masses. Patient with full range of motion and no meningismus.  Cardiovascular: Normal rate and regular rhythm.  Exam reveals no gallop and no friction rub.   No murmur heard. Pulmonary/Chest: Effort normal and breath sounds normal. No respiratory distress. She has no wheezes. She has no rales. She exhibits no tenderness.  Abdominal: Soft. Bowel sounds are normal. She exhibits no distension and no mass. There is no tenderness. There is no rebound and no guarding.  Musculoskeletal: Normal range of motion. She exhibits no edema or tenderness.  Lymphadenopathy:    She has no cervical adenopathy.  Neurological: She is alert and oriented to person, place, and time.  5/5 motor in all  extremities. Sensation is fully intact.  Skin: Skin is warm and dry. No rash noted. No erythema.  Psychiatric: She has a normal mood and affect. Her behavior is normal.  Nursing note and vitals reviewed.   ED Course  Procedures (including critical care time) Labs Review Labs Reviewed - No data to display  Imaging Review No results found. I have personally reviewed and evaluated these images and lab results as part of my medical decision-making.   EKG Interpretation None      MDM   Final diagnoses:  Degenerative joint disease of cervical spine    Review of results from previous MRI. Her symptoms correspond with the area of most severe arthropathy on the MRI. Think this is likely the cause. She is advised to follow-up with Dr. Ellene Route.     Julianne Rice, MD 02/07/16 1550

## 2016-02-14 ENCOUNTER — Other Ambulatory Visit: Payer: Self-pay | Admitting: Internal Medicine

## 2016-02-14 MED ORDER — RIVAROXABAN 20 MG PO TABS: 20.0000 mg | ORAL_TABLET | Freq: Every day | ORAL | Status: DC

## 2016-03-08 ENCOUNTER — Telehealth: Payer: Self-pay | Admitting: Internal Medicine

## 2016-03-08 NOTE — Telephone Encounter (Signed)
New message   Pt verbalized she feels weak and she don't feel like doing any thing  I asked her was she having any other issues she said she feels dry inside

## 2016-03-08 NOTE — Telephone Encounter (Signed)
SPOKE WITH THIS PT IN GREAT  LENGTH  AND  STILL COMPLAINTS  REMAIN VAGUE .  PER PT  DOES  NOT  FEEL NORMAL,  NOTES  DROWSINESS IN  EARLY AM,  BUT PT  STATES  GETS UP  EARLY  (6:00 AM)  AND  DOES  CHORES  AND  THEN RESTS   AND  LAYS DOWN AND  NAPS  AND  AWAKENS  AT  AROUND  9:00 AM.  ALSO HAS  SOME STRESSORS  .  B/P THIS  AM WAS 139/88 AND HEART RATE 78 BEFORE MEDS  HAS  UPCOMING  APPT WITH  DR DR Caryl Comes. PT  AWARE WILL FORWARD  MESSAGE TO DR  Caryl Comes FOR REVIEW .Adonis Housekeeper

## 2016-03-10 NOTE — Telephone Encounter (Signed)
prob followup with PCP as no clear reason yet why this would be heart raelated

## 2016-03-22 ENCOUNTER — Encounter: Payer: Self-pay | Admitting: Internal Medicine

## 2016-03-22 ENCOUNTER — Ambulatory Visit (INDEPENDENT_AMBULATORY_CARE_PROVIDER_SITE_OTHER): Payer: Medicare Other | Admitting: Internal Medicine

## 2016-03-22 VITALS — BP 148/90 | HR 93 | Ht 65.0 in | Wt 189.9 lb

## 2016-03-22 DIAGNOSIS — I10 Essential (primary) hypertension: Secondary | ICD-10-CM | POA: Diagnosis not present

## 2016-03-22 DIAGNOSIS — I480 Paroxysmal atrial fibrillation: Secondary | ICD-10-CM | POA: Diagnosis not present

## 2016-03-22 MED ORDER — LOSARTAN POTASSIUM 25 MG PO TABS: ORAL_TABLET | ORAL | Status: DC

## 2016-03-22 NOTE — Patient Instructions (Signed)
Medication Instructions: - Your physician has recommended you make the following change in your medication:  1) Stop diovan (valsartan)  2) Start cozaar (losartan) 25 mg one tablet by mouth twice daily  Labwork: - none  Procedures/Testing: - none  Follow-Up: - Your physician wants you to follow-up in: 1 year with Dr. Caryl Comes. You will receive a reminder letter in the mail two months in advance. If you don't receive a letter, please call our office to schedule the follow-up appointment.  Any Additional Special Instructions Will Be Listed Below (If Applicable).     If you need a refill on your cardiac medications before your next appointment, please call your pharmacy.

## 2016-03-22 NOTE — Progress Notes (Signed)
T       Patient Care Team: Harlan Stains, MD as PCP - General (Family Medicine)   HPI  Tiffany Black is a 82 y.o. female Seen in followup for atrial fibrillation . She also has a history of hypertension  She underwent an event recorder October 2013 by Dr. Shirlee More. He demonstrated symptoms with sinus rhythm and symptoms with PACs and symptoms with a 3 beat run of atrial tachycardia.  She's also had 2 episodes December 15 February 16 wearing she has had tachycardia palpitations associated with lightheadedness and fatigue. She has ECG monitoring which were reviewed. These demonstrated an atypical atrial flutter with rapid conduction  Echo 10/15 demonstrated normal LV function and mild LVH and left atrial enlargement   She had atrial fibrillation apparently [facility] with Dr. Rosita Fire.  We dont have documentation   Her BP are highest in the afternoons    Past Medical History  Diagnosis Date  . Hypertension   . Arthritis   . PAF (paroxysmal atrial fibrillation) ([place])   . Endometrial ca ([place])   . Anxiety   . HLD (hyperlipidemia)   . Cardiomegaly   . Pectus excavatum   . OA (osteoarthritis)   . Vertigo   . Hx of echocardiogram     a. Echo (11/15): Mild LVH, EF 55-60%, normal wall motion, grade 2 diastolic dysfunction, mild LAE, normal RV function, PASP 29 mm Hg  . Stroke (cerebrum) [facility])     Past Surgical History  Procedure Laterality Date  . Cesarean section  1975  . Laparoscopic hysterectomy  2011  . Cataract extraction, bilateral Bilateral 2009  . Abdominal hysterectomy  2011    Current Outpatient Prescriptions  Medication Sig Dispense Refill  . acetaminophen (TYLENOL) 325 MG tablet Take 650 mg by mouth at bedtime as needed for headache (pain).     . cholecalciferol (VITAMIN D) 1000 UNITS tablet Take 1,000 Units by mouth daily after lunch.     . DIOVAN 160 MG tablet Take 0.5 tablets (80 mg total) by mouth 2 (two) times daily. 60 tablet 0  . felodipine  (PLENDIL) 5 MG 24 hr tablet Take 2.5 mg by mouth 2 (two) times daily.  5  . FLUoxetine (PROZAC) 10 MG tablet Take 10 mg by mouth daily after lunch.     . hydrocortisone (PROCTOSOL HC) 2.5 % rectal cream Place 1 application rectally daily as needed for hemorrhoids or itching.    . labetalol (NORMODYNE) 200 MG tablet Take 200-300 mg by mouth See admin instructions. Take 1 tablet (200 mg) by mouth every morning and 1.5 tablet (300 mg) after supper, may take an extra 1/2 tablet (100 mg) by mouth if BP will not come down.    . meclizine (ANTIVERT) 25 MG tablet Take 25 mg by mouth daily as needed for dizziness.     . Menthol, Topical Analgesic, (BENGAY EX) Apply 1 application topically at bedtime as needed (neck pain).    . metoprolol (LOPRESSOR) 50 MG tablet Take 1/2 tablet (25 mg) by mouth twice daily    . potassium chloride SA (K-DUR,KLOR-CON) 20 MEQ tablet Take 10 mEq by mouth daily after lunch.    . traMADol (ULTRAM) 50 MG tablet Take 1 tablet (50 mg total) by mouth every 6 (six) hours as needed for severe pain. 15 tablet 0  . XARELTO 20 MG TABS tablet Take 1 tablet by mouth daily.    Marland Kitchen zolpidem (AMBIEN) 10 MG tablet Take 5 mg by mouth at bedtime.  2   No current facility-administered medications for this visit.    Allergies  Allergen Reactions  . Hydrochlorothiazide Other (See Comments) and Anaphylaxis    Unknown allergic reaction  . Penicillins Hives and Anaphylaxis    Has patient had a PCN reaction causing immediate rash, facial/tongue/throat swelling, SOB or lightheadedness with hypotension: Yes Has patient had a PCN reaction causing severe rash involving mucus membranes or skin necrosis: No Has patient had a PCN reaction that required hospitalization No Has patient had a PCN reaction occurring within the last 10 years: No If all of the above answers are "NO", then may proceed with Cephalosporin use.   . Ciprofloxacin Other (See Comments)    Blood pressure issues  .  Hydrocodone-Acetaminophen Other (See Comments)    unknown  . Iodine-131 Other (See Comments)    Raises blood pressure  . Latex Itching  . Carbamazepine Other (See Comments) and Rash    Flushed blood pressure medication out of system  . Codeine Nausea And Vomiting and Nausea Only    Nausea and vomiting  . Sulfamethoxazole Rash  . Sulfonamide Derivatives Hives    Hives     Review of Systems negative except from HPI and PMH  Physical Exam BP 148/90 mmHg  Pulse 93  Ht 5\' 5"  (1.651 m)  Wt 189 lb 13.9 oz (86.124 kg)  BMI 31.60 kg/m2  SpO2 98% Well developed and nourished in no acute distress HENT normal Neck supple with JVP-flat Clear Irregularly irregular rate and rhythm, no murmurs or gallops Abd-soft with active BS No Clubbing cyanosis edema Skin-warm and dry A & Oriented  Grossly normal sensory and motor function  ECG dated today demonstrates atrial fibrillation/flutter at 93  intervals-/08/33 Axis left -63  Assessment and  Plan  Hypertension  Atrial fibrillation -      We'll continue her on Rivaroxaban indefinitely given her history of atrial fibrillation and risk factors; She is tolerating it well. She has had intercurrent tachypalpitations. Given the cost of DIovan, will try losartan and see if it works and she can tolerate  No bleeding issues

## 2016-04-18 ENCOUNTER — Other Ambulatory Visit: Payer: Self-pay | Admitting: *Deleted

## 2016-04-18 MED ORDER — XARELTO 20 MG PO TABS: 20.0000 mg | ORAL_TABLET | Freq: Every day | ORAL | 11 refills | Status: DC

## 2016-07-19 ENCOUNTER — Telehealth: Payer: Self-pay | Admitting: Internal Medicine

## 2016-07-19 DIAGNOSIS — R5383 Other fatigue: Secondary | ICD-10-CM

## 2016-07-19 DIAGNOSIS — I480 Paroxysmal atrial fibrillation: Secondary | ICD-10-CM

## 2016-07-19 NOTE — Telephone Encounter (Signed)
Pt calling regarding being on Xarelto and wondering if her blood can get too low? Feels tired a lot --pls advise

## 2016-07-19 NOTE — Telephone Encounter (Signed)
I called and spoke with the patient. She is concerned that her hemoglobin may be low on Xarelto due tiredness that she is having. I advised her that her last draw was in May, so she is free to come in and let us draw her labs.  She is agreeable. She will come on Monday 11/13 for a CBC/BMP.

## 2016-07-24 ENCOUNTER — Other Ambulatory Visit: Payer: Medicare Other

## 2016-07-24 ENCOUNTER — Telehealth: Payer: Self-pay | Admitting: Internal Medicine

## 2016-07-24 NOTE — Telephone Encounter (Signed)
I attempted to contact pt at [phone], received recorded message that pt is unavailable.

## 2016-07-24 NOTE — Telephone Encounter (Signed)
Pt c/o medication issue:  1. Name of Medication: xarelto  2. How are you currently taking this medication (dosage and times per day)? 20 mg tablets once daily  3. Are you having a reaction (difficulty breathing--STAT)? No  4. What is your medication issue? Pt voiced wanting to know if she needs to continue or stop medication.  Please f/u with pt

## 2016-07-24 NOTE — Telephone Encounter (Signed)
I attempted to contact pt at [phone], received recorded message this number had been disconnected.
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
## 2016-09-28 ENCOUNTER — Ambulatory Visit: Payer: Medicare Other | Admitting: Nurse Practitioner

## 2016-10-02 ENCOUNTER — Telehealth: Payer: Self-pay | Admitting: Internal Medicine

## 2016-10-02 NOTE — Telephone Encounter (Signed)
New Message    Schedule pt for 3/13 3:15pm , she is having frequent spells of fatigue and weakness, she is having dizziness

## 2016-10-02 NOTE — Telephone Encounter (Signed)
Called patient.  Patient stated that she would like to get an earlier appt if possible.  States that she is having more fatigue that usual and b/p spikes.  B/p is 120/79 during the day when busy, but is 149/104 upon waking up, no cp, sob or dizziness.  Made appt with Ellen Henri on 10-12-16 @3 :30 for her.  Expressed satisfaction.

## 2016-10-11 ENCOUNTER — Other Ambulatory Visit: Payer: Self-pay | Admitting: Nurse Practitioner

## 2016-10-11 DIAGNOSIS — I480 Paroxysmal atrial fibrillation: Secondary | ICD-10-CM

## 2016-10-11 DIAGNOSIS — I10 Essential (primary) hypertension: Secondary | ICD-10-CM

## 2016-10-12 ENCOUNTER — Ambulatory Visit (INDEPENDENT_AMBULATORY_CARE_PROVIDER_SITE_OTHER): Payer: Medicare Other | Admitting: Cardiology

## 2016-10-12 ENCOUNTER — Encounter: Payer: Self-pay | Admitting: Cardiology

## 2016-10-12 ENCOUNTER — Encounter (INDEPENDENT_AMBULATORY_CARE_PROVIDER_SITE_OTHER): Payer: Self-pay

## 2016-10-12 VITALS — BP 160/90 | HR 88 | Ht 65.0 in | Wt 188.0 lb

## 2016-10-12 DIAGNOSIS — I480 Paroxysmal atrial fibrillation: Secondary | ICD-10-CM | POA: Diagnosis not present

## 2016-10-12 MED ORDER — METOPROLOL TARTRATE 50 MG PO TABS: 50.0000 mg | ORAL_TABLET | Freq: Two times a day (BID) | ORAL | 3 refills | Status: DC

## 2016-10-12 NOTE — Patient Instructions (Signed)
Medication Instructions:  INCREASE METOPROLOL TO 50 MG TWICE A DAY.  Labwork: NONE  Testing/Procedures: NONE  Follow-Up: Your physician recommends that you schedule a follow-up appointment in: [place].   Any Other Special Instructions Will Be Listed Below (If Applicable).     If you need a refill on your cardiac medications before your next appointment, please call your pharmacy.

## 2016-10-12 NOTE — Progress Notes (Signed)
and  10/12/2016 Tiffany Black   02/02/1934  [id number]  Primary Physician [name], MD Primary Cardiologist: Dr. Caryl Comes    Reason for Visit/CC: HTN and fatigue  HPI:  The patient is a 83 y/o female with a h/o atrial fibrillation on Xarelto, as well as a h/o HTN. She is followed by Dr. Caryl Comes. She presents to clinic today with complaint of fatigue and elevated BP readings at home. Her BP in clinic today is 160/90.  She is currently on Valsartan 80 mg BID, labetalol 200 mg daily and metoprolol 25 mg BID. EKG today shows atrial fibrillation with a rate of 89 bpm. Of note, she was in afib when seen by Dr. Caryl Comes 03/2016. She denies CP and dyspnea. Fully complaint with Xarelto.    Current Meds  Medication Sig  . acetaminophen (TYLENOL) 325 MG tablet Take 650 mg by mouth at bedtime as needed for headache (pain).   . cholecalciferol (VITAMIN D) 1000 UNITS tablet Take 1,000 Units by mouth daily after lunch.   . felodipine (PLENDIL) 5 MG 24 hr tablet Take 2.5 mg by mouth 2 (two) times daily.  Marland Kitchen FLUoxetine (PROZAC) 10 MG tablet Take 10 mg by mouth daily after lunch.   . hydrocortisone (PROCTOSOL HC) 2.5 % rectal cream Place 1 application rectally daily as needed for hemorrhoids or itching.  . labetalol (NORMODYNE) 200 MG tablet Take 200-300 mg by mouth See admin instructions. Take 1 tablet (200 mg) by mouth every morning and 1.5 tablet (300 mg) after supper, may take an extra 1/2 tablet (100 mg) by mouth if BP will not come down.  . meclizine (ANTIVERT) 25 MG tablet Take 25 mg by mouth daily as needed for dizziness.   . Menthol, Topical Analgesic, (BENGAY EX) Apply 1 application topically at bedtime as needed (neck pain).  . metoprolol (LOPRESSOR) 50 MG tablet Take 1/2 tablet (25 mg) by mouth twice daily  . potassium chloride SA (K-DUR,KLOR-CON) 20 MEQ tablet Take 10 mEq by mouth daily after lunch.  . valsartan (DIOVAN) 160 MG tablet Take 80 mg by mouth 2 (two) times daily.  Alveda Reasons 20 MG TABS  tablet Take 1 tablet (20 mg total) by mouth daily.  Marland Kitchen zolpidem (AMBIEN) 10 MG tablet Take 5 mg by mouth at bedtime.  . [DISCONTINUED] metoprolol (LOPRESSOR) 50 MG tablet TAKE ONE TABLET BY MOUTH TWICE DAILY   Allergies  Allergen Reactions  . Hydrochlorothiazide Other (See Comments) and Anaphylaxis    Unknown allergic reaction  . Penicillins Hives and Anaphylaxis    Has patient had a PCN reaction causing immediate rash, facial/tongue/throat swelling, SOB or lightheadedness with hypotension: Yes Has patient had a PCN reaction causing severe rash involving mucus membranes or skin necrosis: No Has patient had a PCN reaction that required hospitalization No Has patient had a PCN reaction occurring within the last 10 years: No If all of the above answers are "NO", then may proceed with Cephalosporin use.   . Ciprofloxacin Other (See Comments)    Blood pressure issues  . Hydrocodone-Acetaminophen Other (See Comments)    unknown  . Iodine-131 Other (See Comments)    Raises blood pressure  . Latex Itching  . Carbamazepine Other (See Comments) and Rash    Flushed blood pressure medication out of system  . Codeine Nausea And Vomiting and Nausea Only    Nausea and vomiting  . Sulfamethoxazole Rash  . Sulfonamide Derivatives Hives    Hives    Past Medical History:  Diagnosis Date  .  Anxiety   . Arthritis   . Cardiomegaly   . Endometrial ca ([place])   . HLD (hyperlipidemia)   . Hx of echocardiogram    a. Echo (11/15): Mild LVH, EF 55-60%, normal wall motion, grade 2 diastolic dysfunction, mild LAE, normal RV function, PASP 29 mm Hg  . Hypertension   . OA (osteoarthritis)   . PAF (paroxysmal atrial fibrillation) ([place])   . Pectus excavatum   . 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 Vertigo    Family History  Problem Relation Age of Onset  . Heart attack Father   . Heart disease Father   . Stroke Maternal Uncle   . Hypertension Neg Hx    Past Surgical History:  Procedure  Laterality Date  . ABDOMINAL HYSTERECTOMY  2011  . CATARACT EXTRACTION, BILATERAL Bilateral 2009  . CESAREAN SECTION  1975  . LAPAROSCOPIC HYSTERECTOMY  2011   Social History   Social History  . Marital status: Married    Spouse name: N/A  . Number of children: N/A  . Years of education: N/A   Occupational History  . Retired    Social History Main Topics  . Smoking status: Never Smoker  . Smokeless tobacco: Never Used  . Alcohol use No  . Drug use: No  . Sexual activity: Yes   Other Topics Concern  . Not on file   Social History Narrative   Diet: N/A      Caffeine: N/A      Married, if yes what year: Widow, married 1956      Do you live in a house, apartment, assisted living, condo, trailer, ect: House, one stories, 2 persons      Pets: None      Highest level of education: 12th Grade       Current/Past profession: Network engineer       Exercise: Yes, walking everyday          Living Will:    DNR:   POA/HPOA:      Functional Status:   Do you have difficulty bathing or dressing yourself? No   Do you have difficulty preparing food or eating? No    Do you have difficulty managing your medications? No   Do you have difficulty managing your finances? No    Do you have difficulty affording your medications? Yes        Review of Systems: General: negative for chills, fever, night sweats or weight changes.  Cardiovascular: negative for chest pain, dyspnea on exertion, edema, orthopnea, palpitations, paroxysmal nocturnal dyspnea or shortness of breath Dermatological: negative for rash Respiratory: negative for cough or wheezing Urologic: negative for hematuria Abdominal: negative for nausea, vomiting, diarrhea, bright red blood per rectum, melena, or hematemesis Neurologic: negative for visual changes, syncope, or dizziness All other systems reviewed and are otherwise negative except as noted above.   Physical Exam:  Blood pressure (!) 160/90, pulse 88, height  5\' 5"  (1.651 m), weight 188 lb (85.3 kg).  General appearance: alert, cooperative and no distress Neck: no carotid bruit and no JVD Lungs: clear to auscultation bilaterally Heart: irregularly irregular rhythm Extremities: extremities normal, atraumatic, no cyanosis or edema Pulses: 2+ and symmetric Skin: Skin color, texture, turgor normal. No rashes or lesions Neurologic: Grossly normal  EKG atrial fibrillation with CVR  ASSESSMENT AND PLAN:   1. HTN: BP is elevated at 160/90, in the setting of atrial fibrillation. HR is 89 bpm. We will increase metoprolol to 50  mg BID. Continue valsartan and labetalol. F/u in HTN clinic in 2 weeks.  2. Atrial Fibrillation: rate is controlled, however we will increase metoprolol to 50 mg BID for better BP control. Continue Xarelto.  PLAN  F/u in HTN clinic in 2 weeks.   Lyda Jester PA-C 10/12/2016 3:49 PM

## 2016-10-26 ENCOUNTER — Ambulatory Visit: Payer: Medicare Other

## 2016-11-10 ENCOUNTER — Encounter: Payer: Self-pay | Admitting: Internal Medicine

## 2016-11-14 ENCOUNTER — Ambulatory Visit (INDEPENDENT_AMBULATORY_CARE_PROVIDER_SITE_OTHER): Payer: Medicare Other | Admitting: Nurse Practitioner

## 2016-11-14 ENCOUNTER — Encounter: Payer: Self-pay | Admitting: Nurse Practitioner

## 2016-11-14 VITALS — BP 142/82 | HR 84 | Temp 97.6°F | Resp 18 | Ht 65.0 in | Wt 186.4 lb

## 2016-11-14 DIAGNOSIS — Z8673 Personal history of transient ischemic attack (TIA), and cerebral infarction without residual deficits: Secondary | ICD-10-CM

## 2016-11-14 DIAGNOSIS — I10 Essential (primary) hypertension: Secondary | ICD-10-CM

## 2016-11-14 DIAGNOSIS — H6123 Impacted cerumen, bilateral: Secondary | ICD-10-CM | POA: Diagnosis not present

## 2016-11-14 DIAGNOSIS — I480 Paroxysmal atrial fibrillation: Secondary | ICD-10-CM | POA: Diagnosis not present

## 2016-11-14 DIAGNOSIS — F419 Anxiety disorder, unspecified: Secondary | ICD-10-CM

## 2016-11-14 DIAGNOSIS — E782 Mixed hyperlipidemia: Secondary | ICD-10-CM

## 2016-11-14 NOTE — Patient Instructions (Signed)
Make sure to clarify your metoprolol with cardiologist  To follow up in 4 weeks for EV with fasting lab work prior to visit.

## 2016-11-14 NOTE — Progress Notes (Signed)
Careteam: Patient Care Team: Harlan Stains, MD as PCP - General (Family Medicine) Deboraha Sprang, MD as Consulting Physician (Cardiology) Shon Hough, MD as Consulting Physician (Ophthalmology) Sanjuana Mae, MD as Referring Physician (Vascular Surgery)  Advanced Directive information Does Patient Have a Medical Advance Directive?: Yes, Type of Advance Directive: [place];Living will  Allergies  Allergen Reactions  . Hydrochlorothiazide Other (See Comments) and Anaphylaxis    Unknown allergic reaction  . Penicillins Hives and Anaphylaxis    Has patient had a PCN reaction causing immediate rash, facial/tongue/throat swelling, SOB or lightheadedness with hypotension: Yes Has patient had a PCN reaction causing severe rash involving mucus membranes or skin necrosis: No Has patient had a PCN reaction that required hospitalization No Has patient had a PCN reaction occurring within the last 10 years: No If all of the above answers are "NO", then may proceed with Cephalosporin use.   . Ciprofloxacin Other (See Comments)    Blood pressure issues  . Hydrocodone-Acetaminophen Other (See Comments)    unknown  . Iodine-131 Other (See Comments)    Raises blood pressure  . Latex Itching  . Carbamazepine Other (See Comments) and Rash    Flushed blood pressure medication out of system  . Codeine Nausea And Vomiting and Nausea Only    Nausea and vomiting  . Sulfamethoxazole Rash  . Sulfonamide Derivatives Hives    Hives     Chief Complaint  Patient presents with  . New Patient (Initial Visit)    Pt is being seen today to establish care with this office. Last PCP was at [place]. Changing due to needing PCP closer to home.   . Medical Management of Chronic Issues     HPI: Patient is a 83 y.o. female seen in the office today to establish care. Previously has been seen at [facility] but this closer. She does not drive much.  Gets prescriptions from Dr  Hartford Poli at [facility] who is an hypertension specialist.  Has been to several doctor. Was upset because refills provided were in [facility] name.   Has AWV in December.   Seeing Cardiologist next week- pt with hx of afib, has been on metoprolol only AS NEEDED for pulse, cardiology PA adjusted it to  50 mg by mouth twice daily but she reports she does not "need" this.   Pt reports her husband passed away and it was very stressful- was a started on prozac reports she feels better when she is taking it  Previously seeing a neurologist due to neck pain- neurologist referred her to [facility] for injection.  Did not like neurologist that she saw.   Does not take flu vaccine.   Does not eat red meat- does not feel like she has high cholesterol.  Intolerant to cholesterol medication- reports she took one in the past.   Review of Systems:  Review of Systems  Constitutional: Negative for activity change, appetite change, fatigue and unexpected weight change.  HENT: Negative for congestion and hearing loss.   Eyes: Positive for visual disturbance (wears glasses).  Respiratory: Negative for cough and shortness of breath.   Cardiovascular: Negative for chest pain, palpitations and leg swelling.  Gastrointestinal: Negative for abdominal pain, constipation and diarrhea.  Genitourinary: Negative for decreased urine volume, difficulty urinating and dysuria.  Musculoskeletal: Positive for arthralgias and neck pain. Negative for myalgias.  Skin: Negative for color change and wound.  Neurological: Negative for dizziness and weakness.  Psychiatric/Behavioral: Negative for agitation,  behavioral problems and confusion.    Past Medical History:  Diagnosis Date  . Anxiety   . Arthritis   . Cardiomegaly   . Endometrial ca ([place])   . HLD (hyperlipidemia)   . Hx of echocardiogram    a. Echo (11/15): Mild LVH, EF 55-60%, normal wall motion, grade 2 diastolic dysfunction, mild LAE, normal RV function,  PASP 29 mm Hg  . Hypertension   . OA (osteoarthritis)   . PAF (paroxysmal atrial fibrillation) ([place])   . Pectus excavatum   . 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 Vertigo    Past Surgical History:  Procedure Laterality Date  . ABDOMINAL HYSTERECTOMY  2011  . CATARACT EXTRACTION, BILATERAL Bilateral 2009  . CESAREAN SECTION  1975  . LAPAROSCOPIC HYSTERECTOMY  2011   Social History:   reports that she has never smoked. She has never used smokeless tobacco. She reports that she does not drink alcohol or use drugs.  Family History  Problem Relation Age of Onset  . Heart attack Father   . Heart disease Father   . Stroke Maternal Uncle   . Hypertension Neg Hx     Medications: Patient's Medications  New Prescriptions   No medications on file  Previous Medications   ACETAMINOPHEN (TYLENOL) 325 MG TABLET    Take 650 mg by mouth at bedtime as needed for headache (pain).    CHOLECALCIFEROL (VITAMIN D) 1000 UNITS TABLET    Take 1,000 Units by mouth daily after lunch.    FELODIPINE (PLENDIL) 5 MG 24 HR TABLET    Take 2.5 mg by mouth 2 (two) times daily.   FLUOXETINE (PROZAC) 10 MG TABLET    Take 10 mg by mouth daily after lunch.    HYDROCORTISONE (PROCTOSOL HC) 2.5 % RECTAL CREAM    Place 1 application rectally daily as needed for hemorrhoids or itching.   LABETALOL (NORMODYNE) 200 MG TABLET    Take 1 tablet (200 mg) by mouth every morning and 1.5 tablet (300 mg) after supper.   MECLIZINE (ANTIVERT) 25 MG TABLET    Take 25 mg by mouth daily as needed for dizziness.    MENTHOL, TOPICAL ANALGESIC, (BENGAY EX)    Apply 1 application topically at bedtime as needed (neck pain).   METOPROLOL (LOPRESSOR) 50 MG TABLET    Take 1 tablet (50 mg total) by mouth 2 (two) times daily.   POTASSIUM CHLORIDE SA (K-DUR,KLOR-CON) 20 MEQ TABLET    Take 10 mEq by mouth daily after lunch.   VALSARTAN (DIOVAN) 160 MG TABLET    Take 80 mg by mouth 2 (two) times daily.   XARELTO 20 MG TABS TABLET    Take  1 tablet (20 mg total) by mouth daily.   ZOLPIDEM (AMBIEN) 10 MG TABLET    Take 5 mg by mouth at bedtime.  Modified Medications   No medications on file  Discontinued Medications   No medications on file     Physical Exam:  Vitals:   11/14/16 0902  BP: (!) 142/82  Pulse: 84  Resp: 18  Temp: 97.6 F (36.4 C)  TempSrc: Oral  SpO2: 96%  Weight: 186 lb 6.4 oz (84.6 kg)  Height: '5\' 5"'  (1.651 m)   Body mass index is 31.02 kg/m.  Physical Exam  Constitutional: She is oriented to person, place, and time. She appears well-developed and well-nourished. No distress.  HENT:  Head: Normocephalic and atraumatic.  Mouth/Throat: Oropharynx is clear and moist. No oropharyngeal  exudate.  Eyes: Conjunctivae are normal. Pupils are equal, round, and reactive to light.  Neck: Normal range of motion. Neck supple.  Cardiovascular: Normal rate and normal heart sounds.  An irregularly irregular rhythm present.  Pulmonary/Chest: Effort normal and breath sounds normal.  Abdominal: Soft. Bowel sounds are normal.  Musculoskeletal: She exhibits no edema or tenderness.  Neurological: She is alert and oriented to person, place, and time.  Skin: Skin is warm and dry. She is not diaphoretic.  Psychiatric: She has a normal mood and affect.    Labs reviewed: Basic Metabolic Panel:  Recent Labs  01/13/16 1005  NA 139  K 4.3  CL 103  CO2 27  GLUCOSE 89  BUN 18  CREATININE 0.81  CALCIUM 9.2   Liver Function Tests: No results for input(s): AST, ALT, ALKPHOS, BILITOT, PROT, ALBUMIN in the last 8760 hours. No results for input(s): LIPASE, AMYLASE in the last 8760 hours. No results for input(s): AMMONIA in the last 8760 hours. CBC:  Recent Labs  01/13/16 1005  WBC 6.7  HGB 13.7  HCT 40.4  MCV 88.4  PLT 226   Lipid Panel: No results for input(s): CHOL, HDL, LDLCALC, TRIG, CHOLHDL, LDLDIRECT in the last 8760 hours. TSH: No results for input(s): TSH in the last 8760 hours. A1C: Lab  Results  Component Value Date   HGBA1C 5.8 (H) 05/11/2015     Assessment/Plan 1. Paroxysmal atrial fibrillation (HCC) Currently rate controlled, conts on xarelto for anticoagulation.   2. Essential hypertension Slightly elevated today. Pt currently taking feodipine, labetalol and valsartan. Will cont current regimen. At last visit metoprolol was increased for HTN but pt reports she only takes that as needed for tachycardia, will defer this to cardiology.   3. Anxiety Stable on prozac  4. 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 conts on xarelto 20 mg daily for preventative.   5. Mixed hyperlipidemia -reports she has tried a cholesterol medication in the past that made her ache, suspect this was a statin. Attempts heart healthy diet.  - CMP with eGFR; Future - Lipid Panel; Future  Follow up in 4 weeks for EV labs prior to appt (has AWV in dec) with [facility], records to be requested from former PCP [name] K. Harle Battiest  [facility] [phone] 8 am - 5 pm) [phone] (after hours)

## 2016-11-21 ENCOUNTER — Ambulatory Visit: Payer: Medicare Other | Admitting: Internal Medicine

## 2016-12-15 ENCOUNTER — Other Ambulatory Visit: Payer: Medicare Other

## 2016-12-15 DIAGNOSIS — E782 Mixed hyperlipidemia: Secondary | ICD-10-CM

## 2016-12-15 LAB — COMPLETE METABOLIC PANEL WITH GFR
ALT: 10 U/L (ref 6–29)
AST: 17 U/L (ref 10–35)
Albumin: 3.9 g/dL (ref 3.6–5.1)
Alkaline Phosphatase: 62 U/L (ref 33–130)
BUN: 12 mg/dL (ref 7–25)
CALCIUM: 9 mg/dL (ref 8.6–10.4)
CHLORIDE: 105 mmol/L (ref 98–110)
CO2: 28 mmol/L (ref 20–31)
CREATININE: 0.81 mg/dL (ref 0.60–0.88)
GFR, EST NON AFRICAN AMERICAN: 68 mL/min (ref >=60)
GFR, Est African American: 79 mL/min (ref >=60)
Glucose, Bld: 114 mg/dL — ABNORMAL HIGH (ref 65–99)
POTASSIUM: 4.2 mmol/L (ref 3.5–5.3)
SODIUM: 140 mmol/L (ref 135–146)
Total Bilirubin: 0.6 mg/dL (ref 0.2–1.2)
Total Protein: 6.4 g/dL (ref 6.1–8.1)

## 2016-12-15 LAB — LIPID PANEL
Cholesterol: 199 mg/dL (ref <200)
HDL: 28 mg/dL — ABNORMAL LOW (ref >50)
LDL Cholesterol: 135 mg/dL — ABNORMAL HIGH (ref <100)
Total CHOL/HDL Ratio: 7.1 Ratio — ABNORMAL HIGH (ref <5.0)
Triglycerides: 178 mg/dL — ABNORMAL HIGH (ref <150)
VLDL: 36 mg/dL — AB (ref <30)

## 2016-12-19 ENCOUNTER — Ambulatory Visit (INDEPENDENT_AMBULATORY_CARE_PROVIDER_SITE_OTHER): Payer: Medicare Other | Admitting: Nurse Practitioner

## 2016-12-19 ENCOUNTER — Encounter: Payer: Self-pay | Admitting: Nurse Practitioner

## 2016-12-19 ENCOUNTER — Other Ambulatory Visit: Payer: Self-pay

## 2016-12-19 VITALS — BP 138/88 | HR 90 | Temp 97.7°F | Resp 17 | Ht 65.0 in | Wt 186.4 lb

## 2016-12-19 DIAGNOSIS — Q383 Other congenital malformations of tongue: Secondary | ICD-10-CM | POA: Diagnosis not present

## 2016-12-19 DIAGNOSIS — E782 Mixed hyperlipidemia: Secondary | ICD-10-CM | POA: Diagnosis not present

## 2016-12-19 DIAGNOSIS — F419 Anxiety disorder, unspecified: Secondary | ICD-10-CM

## 2016-12-19 DIAGNOSIS — I480 Paroxysmal atrial fibrillation: Secondary | ICD-10-CM

## 2016-12-19 DIAGNOSIS — R7301 Impaired fasting glucose: Secondary | ICD-10-CM

## 2016-12-19 DIAGNOSIS — Z8673 Personal history of transient ischemic attack (TIA), and cerebral infarction without residual deficits: Secondary | ICD-10-CM

## 2016-12-19 LAB — CBC WITH DIFFERENTIAL/PLATELET
Basophils Absolute: 72 cells/uL (ref 0–200)
Basophils Relative: 1 %
EOS PCT: 3 %
Eosinophils Absolute: 216 cells/uL (ref 15–500)
HEMATOCRIT: 40.7 % (ref 35.0–45.0)
Hemoglobin: 13.5 g/dL (ref 11.7–15.5)
LYMPHS PCT: 28 %
Lymphs Abs: 2016 cells/uL (ref 850–3900)
MCH: 30.4 pg (ref 27.0–33.0)
MCHC: 33.2 g/dL (ref 32.0–36.0)
MCV: 91.7 fL (ref 80.0–100.0)
MPV: 10.9 fL (ref 7.5–12.5)
Monocytes Absolute: 648 cells/uL (ref 200–950)
Monocytes Relative: 9 %
NEUTROS PCT: 59 %
Neutro Abs: 4248 cells/uL (ref 1500–7800)
Platelets: 223 10*3/uL (ref 140–400)
RBC: 4.44 MIL/uL (ref 3.80–5.10)
RDW: 13.8 % (ref 11.0–15.0)
WBC: 7.2 10*3/uL (ref 3.8–10.8)

## 2016-12-19 MED ORDER — EZETIMIBE 10 MG PO TABS: 10.0000 mg | ORAL_TABLET | Freq: Every day | ORAL | 1 refills | Status: DC

## 2016-12-19 MED ORDER — FLUOXETINE HCL 10 MG PO TABS: 10.0000 mg | ORAL_TABLET | Freq: Every day | ORAL | 2 refills | Status: DC

## 2016-12-19 NOTE — Patient Instructions (Signed)
To cut back on sweet, limit sugar foods  Will start medication for cholesterol, it is not at goal-- to start ezetimibe (ZETIA) 10 MG tablet; Take 1 tablet (10 mg total) by mouth daily.  To follow up in 6 weeks with Dr Mariea Clonts   Heart-Healthy Eating Plan Many factors influence your heart health, including eating and exercise habits. Heart (coronary) risk increases with abnormal blood fat (lipid) levels. Heart-healthy meal planning includes limiting unhealthy fats, increasing healthy fats, and making other small dietary changes. This includes maintaining a healthy body weight to help keep lipid levels within a normal range. . What types of fat should I choose?  Choose healthy fats more often. Choose monounsaturated and polyunsaturated fats, such as olive oil and canola oil, flaxseeds, walnuts, almonds, and seeds.  Eat more omega-3 fats. Good choices include salmon, mackerel, sardines, tuna, flaxseed oil, and ground flaxseeds. Aim to eat fish at least two times each week.  Limit saturated fats. Saturated fats are primarily found in animal products, such as meats, butter, and cream. Plant sources of saturated fats include palm oil, palm kernel oil, and coconut oil.  Avoid foods with partially hydrogenated oils in them. These contain trans fats. Examples of foods that contain trans fats are stick margarine, some tub margarines, cookies, crackers, and other baked goods. What general guidelines do I need to follow?  Check food labels carefully to identify foods with trans fats or high amounts of saturated fat.  Fill one half of your plate with vegetables and green salads. Eat 4-5 servings of vegetables per day. A serving of vegetables equals 1 cup of raw leafy vegetables,  cup of raw or cooked cut-up vegetables, or  cup of vegetable juice.  Fill one fourth of your plate with whole grains. Look for the word "whole" as the first word in the ingredient list.  Fill one fourth of your plate with lean  protein foods.  Eat 4-5 servings of fruit per day. A serving of fruit equals one medium whole fruit,  cup of dried fruit,  cup of fresh, frozen, or canned fruit, or  cup of 100% fruit juice.  Eat more foods that contain soluble fiber. Examples of foods that contain this type of fiber are apples, broccoli, carrots, beans, peas, and barley. Aim to get 20-30 g of fiber per day.  Eat more home-cooked food and less restaurant, buffet, and fast food.  Limit or avoid alcohol.  Limit foods that are high in starch and sugar.  Avoid fried foods.  Cook foods by using methods other than frying. Baking, boiling, grilling, and broiling are all great options. Other fat-reducing suggestions include:  Removing the skin from poultry.  Removing all visible fats from meats.  Skimming the fat off of stews, soups, and gravies before serving them.  Steaming vegetables in water or broth.  Lose weight if you are overweight. Losing just 5-10% of your initial body weight can help your overall health and prevent diseases such as diabetes and heart disease.  Increase your consumption of nuts, legumes, and seeds to 4-5 servings per week. One serving of dried beans or legumes equals  cup after being cooked, one serving of nuts equals 1 ounces, and one serving of seeds equals  ounce or 1 tablespoon.  You may need to monitor your salt (sodium) intake, especially if you have high blood pressure. Talk with your health care provider or dietitian to get more information about reducing sodium. What foods can I eat? Grains  Breads, including [place], white, pita, wheat, raisin, rye, oatmeal, and [place]. Tortillas that are neither fried nor made with lard or trans fat. Low-fat rolls, including hotdog and hamburger buns and English muffins. Biscuits. Muffins. Waffles. Pancakes. Light popcorn. Whole-grain cereals. Flatbread. Melba toast. Pretzels. Breadsticks. Rusks. Low-fat snacks and crackers, including oyster,  saltine, matzo, graham, animal, and rye. Rice and pasta, including brown rice and those that are made with whole wheat. Vegetables  All vegetables. Fruits  All fruits, but limit coconut. Meats and Other Protein Sources  Lean, well-trimmed beef, veal, pork, and lamb. Chicken and [place] without skin. All fish and shellfish. Wild duck, rabbit, pheasant, and venison. Egg whites or low-cholesterol egg substitutes. Dried beans, peas, lentils, and tofu.Seeds and most nuts. Dairy  Low-fat or nonfat cheeses, including ricotta, string, and mozzarella. Skim or 1% milk that is liquid, powdered, or evaporated. Buttermilk that is made with low-fat milk. Nonfat or low-fat yogurt. Beverages  Mineral water. Diet carbonated beverages. Sweets and Desserts  Sherbets and fruit ices. Honey, jam, marmalade, jelly, and syrups. Meringues and gelatins. Pure sugar candy, such as hard candy, jelly beans, gumdrops, mints, marshmallows, and small amounts of dark chocolate. [employer] Eat all sweets and desserts in moderation. Fats and Oils  Nonhydrogenated (trans-free) margarines. Vegetable oils, including soybean, sesame, sunflower, olive, peanut, safflower, corn, canola, and cottonseed. Salad dressings or mayonnaise that are made with a vegetable oil. Limit added fats and oils that you use for cooking, baking, salads, and as spreads. Other  Cocoa powder. Coffee and tea. All seasonings and condiments. The items listed above may not be a complete list of recommended foods or beverages. Contact your dietitian for more options.  What foods are not recommended? Grains  Breads that are made with saturated or trans fats, oils, or whole milk. Croissants. Butter rolls. Cheese breads. Sweet rolls. Donuts. Buttered popcorn. Chow mein noodles. High-fat crackers, such as cheese or butter crackers. Meats and Other Protein Sources  Fatty meats, such as hotdogs, short ribs, sausage, spareribs, bacon, ribeye roast or steak, and  mutton. High-fat deli meats, such as salami and bologna. Caviar. Domestic duck and goose. Organ meats, such as kidney, liver, sweetbreads, brains, gizzard, chitterlings, and heart. Dairy  Cream, sour cream, cream cheese, and creamed cottage cheese. Whole milk cheeses, including blue (bleu), Monterey Jack, [place], [place], American, [place], Swiss, [place], [place], and [place]. Whole or 2% milk that is liquid, evaporated, or condensed. Whole buttermilk. Cream sauce or high-fat cheese sauce. Yogurt that is made from whole milk. Beverages  Regular sodas and drinks with added sugar. Sweets and Desserts  Frosting. Pudding. Cookies. Cakes other than angel food cake. Candy that has milk chocolate or white chocolate, hydrogenated fat, butter, coconut, or unknown ingredients. Buttered syrups. Full-fat ice cream or ice cream drinks. Fats and Oils  Gravy that has suet, meat fat, or shortening. Cocoa butter, hydrogenated oils, palm oil, coconut oil, palm kernel oil. These can often be found in baked products, candy, fried foods, nondairy creamers, and whipped toppings. Solid fats and shortenings, including bacon fat, salt pork, lard, and butter. Nondairy cream substitutes, such as coffee creamers and sour cream substitutes. Salad dressings that are made of unknown oils, cheese, or sour cream. The items listed above may not be a complete list of foods and beverages to avoid. Contact your dietitian for more information.  This information is not intended to replace advice given to you by your health care provider. Make sure you discuss any questions you have  with your health care provider. Document Released: 06/06/2008 Document Revised: 03/17/2016 Document Reviewed: 02/19/2014 Elsevier Interactive Patient Education  2017 [employer].

## 2016-12-19 NOTE — Progress Notes (Signed)
Careteam: Patient Care Team: Lauree Chandler, NP as PCP - General (Geriatric Medicine) Deboraha Sprang, MD as Consulting Physician (Cardiology) Shon Hough, MD as Consulting Physician (Ophthalmology) Sanjuana Mae, MD as Referring Physician (Vascular Surgery)  Advanced Directive information Does Patient Have a Medical Advance Directive?: No, Would patient like information on creating a medical advance directive?: Yes (MAU/Ambulatory/Procedural Areas - Information given)  Allergies  Allergen Reactions  . Hydrochlorothiazide Other (See Comments) and Anaphylaxis    Unknown allergic reaction  . Penicillins Hives and Anaphylaxis    Has patient had a PCN reaction causing immediate rash, facial/tongue/throat swelling, SOB or lightheadedness with hypotension: Yes Has patient had a PCN reaction causing severe rash involving mucus membranes or skin necrosis: No Has patient had a PCN reaction that required hospitalization No Has patient had a PCN reaction occurring within the last 10 years: No If all of the above answers are "NO", then may proceed with Cephalosporin use.   . Ciprofloxacin Other (See Comments)    Blood pressure issues  . Hydrocodone-Acetaminophen Other (See Comments)    unknown  . Iodine-131 Other (See Comments)    Raises blood pressure  . Latex Itching  . Carbamazepine Other (See Comments) and Rash    Flushed blood pressure medication out of system  . Codeine Nausea And Vomiting and Nausea Only    Nausea and vomiting  . Sulfamethoxazole Rash  . Sulfonamide Derivatives Hives    Hives     Chief Complaint  Patient presents with  . Medical Management of Chronic Issues    Pt is being seen for extended visit with [facility].Passed clock drawing  . Other    Pt states that she had a mini stroke about 3 weeks ago. Pt did not go to ED because symptoms went away within minutes.      HPI: Patient is a 83 y.o. female seen in the office today for follow up. PMH  includes CVA, HTN, PAC, and Afib. Pt has no new complaints today. She does state that she feels that she has had a TIA recently due to numbness in her right forefinger and thumb, bilateral dropping eyelids, and weakness in her right leg upon ambulation which lasted approximately 1-2 minutes, then she was back to her normal baseline. She did not seek medical care for this and does not seem to have any deficits at this time. She has no residual from her previous stroke, although in talking with her she may have slight memory deficits, which she admits to having. Denies CP, palpitations, dizziness, syncope, or falls. No gait abnormalities.   She is wanting a skin assessment due to multiple skin tag removals in the past and one on her left forehead in which she is wanting removed. She is currently taking xarelto for her afib.      Review of Systems:  Review of Systems  Constitutional: Negative for activity change, appetite change, chills, diaphoresis, fatigue, fever and unexpected weight change.  HENT: Negative.   Respiratory: Negative for apnea, cough, chest tightness and wheezing.   Cardiovascular: Negative for chest pain, palpitations and leg swelling.  Gastrointestinal: Negative for abdominal distention, abdominal pain, blood in stool, diarrhea, nausea and vomiting.  Genitourinary: Negative for dysuria and frequency.  Musculoskeletal: Negative for back pain, gait problem and joint swelling.  Skin: Negative for color change and rash.  Neurological: Negative for tremors, syncope, speech difficulty, weakness, light-headedness, numbness and headaches.  Psychiatric/Behavioral: Positive for confusion. Negative for behavioral problems and sleep  disturbance. The patient is not nervous/anxious.     Past Medical History:  Diagnosis Date  . Anxiety   . Arthritis   . Bulging of cervical intervertebral disc   . Cardiomegaly   . Cataracts, bilateral   . Chronic neck pain   . History of endometrial  cancer   . HLD (hyperlipidemia)   . Hx of echocardiogram    a. Echo (11/15): Mild LVH, EF 55-60%, normal wall motion, grade 2 diastolic dysfunction, mild LAE, normal RV function, PASP 29 mm Hg  . Hypertension   . Insomnia   . Lung nodule   . OA (osteoarthritis)   . PAF (paroxysmal atrial fibrillation) ([place])   . Pectus excavatum   . Stroke (cerebrum) ([place])    2015 and 2016  . Vertigo    Past Surgical History:  Procedure Laterality Date  . ABDOMINAL HYSTERECTOMY  2011  . CATARACT EXTRACTION, BILATERAL Bilateral 2009  . CESAREAN SECTION  1975  . LAPAROSCOPIC HYSTERECTOMY  2011   Social History:   reports that she has never smoked. She has never used smokeless tobacco. She reports that she does not drink alcohol or use drugs.  Family History  Problem Relation Age of Onset  . Dementia Mother   . Heart attack Father   . Heart disease Father   . Stroke Maternal Uncle   . Hypertension Neg Hx     Medications: Patient's Medications  New Prescriptions   No medications on file  Previous Medications   ACETAMINOPHEN (TYLENOL) 325 MG TABLET    Take 650 mg by mouth at bedtime as needed for headache (pain).    CHOLECALCIFEROL (VITAMIN D) 1000 UNITS TABLET    Take 1,000 Units by mouth daily after lunch.    FELODIPINE (PLENDIL) 5 MG 24 HR TABLET    Take 2.5 mg by mouth 2 (two) times daily.   FLUOXETINE (PROZAC) 10 MG TABLET    Take 10 mg by mouth daily after lunch.    HYDROCORTISONE (PROCTOSOL HC) 2.5 % RECTAL CREAM    Place 1 application rectally daily as needed for hemorrhoids or itching.   LABETALOL (NORMODYNE) 200 MG TABLET    Take 1 tablet (200 mg) by mouth every morning and 1.5 tablet (300 mg) after supper.   MECLIZINE (ANTIVERT) 25 MG TABLET    Take 25 mg by mouth daily as needed for dizziness.    MENTHOL, TOPICAL ANALGESIC, (BENGAY EX)    Apply 1 application topically at bedtime as needed (neck pain).   METOPROLOL (LOPRESSOR) 50 MG TABLET    Take 1 tablet (50 mg total) by mouth 2  (two) times daily.   POTASSIUM CHLORIDE SA (K-DUR,KLOR-CON) 20 MEQ TABLET    Take 10 mEq by mouth daily after lunch.   VALSARTAN (DIOVAN) 160 MG TABLET    Take 80 mg by mouth 2 (two) times daily.   XARELTO 20 MG TABS TABLET    Take 1 tablet (20 mg total) by mouth daily.   ZOLPIDEM (AMBIEN) 10 MG TABLET    Take 5 mg by mouth at bedtime.  Modified Medications   No medications on file  Discontinued Medications   No medications on file     Physical Exam:  Vitals:   12/19/16 1331  BP: 138/88  Pulse: 90  Resp: 17  Temp: 97.7 F (36.5 C)  TempSrc: Oral  SpO2: 95%  Weight: 186 lb 6.4 oz (84.6 kg)  Height: 5\' 5"  (1.651 m)   Body mass index is 31.02 kg/m.  Physical Exam  Constitutional: She is oriented to person, place, and time. She appears well-developed and well-nourished. No distress.  HENT:  Head: Normocephalic and atraumatic.  Mouth/Throat: Uvula is midline. Mucous membranes are not dry. She does not have dentures. Oral lesions present. No oropharyngeal exudate.  Mid tongue, dark brown discoloration that was accumulated, able to scrape off into small particles with tongue blade.   Eyes: Conjunctivae are normal. Pupils are equal, round, and reactive to light. Right eye exhibits no discharge. Left eye exhibits no discharge.  Neck: Normal range of motion. Neck supple. No JVD present.  Cardiovascular: Normal rate and intact distal pulses.  A regularly irregular rhythm present.  Pulmonary/Chest: Effort normal and breath sounds normal. No respiratory distress. She has no wheezes. She exhibits no tenderness.  Abdominal: Soft. Bowel sounds are normal. She exhibits no distension and no mass. There is no tenderness. There is no rebound and no guarding.  Musculoskeletal: She exhibits no edema or tenderness.       Left ankle: She exhibits no swelling.  Neurological: She is alert and oriented to person, place, and time. She has normal strength. No cranial nerve deficit or sensory deficit.  Gait normal.  Skin: Skin is warm and dry. She is not diaphoretic. No erythema.  Skin tag to left forehead.   Psychiatric: She has a normal mood and affect. Her speech is normal and behavior is normal.    Labs reviewed: Basic Metabolic Panel:  Recent Labs  01/13/16 1005 12/15/16 0829  NA 139 140  K 4.3 4.2  CL 103 105  CO2 27 28  GLUCOSE 89 114*  BUN 18 12  CREATININE 0.81 0.81  CALCIUM 9.2 9.0   Liver Function Tests:  Recent Labs  12/15/16 0829  AST 17  ALT 10  ALKPHOS 62  BILITOT 0.6  PROT 6.4  ALBUMIN 3.9   No results for input(s): LIPASE, AMYLASE in the last 8760 hours. No results for input(s): AMMONIA in the last 8760 hours. CBC:  Recent Labs  01/13/16 1005  WBC 6.7  HGB 13.7  HCT 40.4  MCV 88.4  PLT 226   Lipid Panel:  Recent Labs  12/15/16 0829  CHOL 199  HDL 28*  LDLCALC 135*  TRIG 178*  CHOLHDL 7.1*   TSH: No results for input(s): TSH in the last 8760 hours. A1C: Lab Results  Component Value Date   HGBA1C 5.8 (H) 05/11/2015     Assessment/Plan 1. Mixed hyperlipidemia -Educated on the risk versus benefits for treating versus not treating  her high cholesterol including stroke, TIA, and other cardiovascular/neurovascular disorders.  -Will add ezetimibe (ZETIA) 10 MG tablet; Take 1 tablet (10 mg total) by mouth daily.  Dispense: 30 tablet; Refill: 1 however pt reports she does not wish to take medication after medication sent to pharmacy. Says that she will make diet changes.  -Educated on benefits of diet and exercise lifestyle modifications Including low saturated fat diet and increased exercise   2. Anxiety -Stable. Refill provided.  - FLUoxetine (PROZAC) 10 MG tablet; Take 1 tablet (10 mg total) by mouth daily after lunch.  Dispense: 30 tablet; Refill: 2  3. IFG (impaired fasting glucose) -Unstable, will assess hemoglobin A1c -See diet recommendations per #1  4. Paroxysmal atrial fibrillation (HCC) -Stable, rate controlled.  No s/s of bleeding in urine or stool. No abnormal bruising. - CBC with Differential/Platelets  5. Old cerebrovascular accident (CVA) without late effect -See #1 for risks versus benefits  6. Tongue abnormality -  encouraged to brush tongue thoroughly twice daily  - Throat culture  Total time 50 mins:  time greater than 50% of total time spent doing pt counseled and coordination of care regarding hyperlipidemia, hyperglycemia, risk of MI/CVA, etc  Will follow up in 6 weeks with Dr. Mariea Clonts, sooner if needed.    Carlos American. Harle Battiest  [facility] [phone] 8 am - 5 pm) [phone] (after hours)

## 2016-12-20 ENCOUNTER — Telehealth: Payer: Self-pay | Admitting: *Deleted

## 2016-12-20 LAB — HEMOGLOBIN A1C
Hgb A1c MFr Bld: 5.5 % (ref <5.7)
MEAN PLASMA GLUCOSE: 111 mg/dL

## 2016-12-20 NOTE — Telephone Encounter (Signed)
Melissa with [facility], Dr. Remo Lipps Klein's office, called and stated that patient saw [name] yesterday and patient is calling their office for an appointment because [name] told her to follow up with them due to a Mini Stroke she had in March.  Melissa with [facility] wants to verify this because Dr. Caryl Comes is a Cardiologist and wants to know if this is who you are wanting the patient to follow up with for the mini stroke or something different. States your OV note states nothing about patient following up with Dr. Caryl Comes. Please Advise.

## 2016-12-21 NOTE — Telephone Encounter (Signed)
Forwarded to [place] since [employer] at [employer].

## 2016-12-21 NOTE — Telephone Encounter (Signed)
Pt was seen in office and described a TIA, hx of CVA, discussed proper blood pressure and lipid control which she was not interested in taking medication for this. Pt repeated multiple times how she felt like she needed to see her cardiologist sooner. Reported they just had her follow up a the hypertension clinic and she felt like she needed to be seen sooner than her routine visit.  Pt probably would benefit from a neurologist referral actually due to hx of CVA which we can place referral if she is interested in this.

## 2016-12-21 NOTE — Telephone Encounter (Signed)
I spoke with Tiffany Black at [facility] to clarify that [name] did not advise patient to make an appointment with [facility]. [name] has recommended that patient be seen by neurologist due to possible TIA.   I spoke with patient and she has declined the neurology referral at this time. She wants to use the neurologist she saw many years ago but she cannot remember his name.   She insists that her cardiologist can manage her possible TIA's.

## 2016-12-22 ENCOUNTER — Telehealth: Payer: Self-pay

## 2016-12-22 LAB — CULTURE, UPPER RESPIRATORY: ORGANISM ID, BACTERIA: NORMAL

## 2016-12-22 NOTE — Telephone Encounter (Signed)
Tiffany Black please advise if any further follow-up is needed in this encounter.

## 2016-12-22 NOTE — Telephone Encounter (Signed)
Patient called to question if prozac was filled on 12/19/16. Patient informed that we sent rx on 12/19/16 along with Zetia. Patient said when she went to the pharmacy they told her she only had 1 rx available   I called [facility] and was told patient was there 20 min ago and they tried to give patient rx for prozac and she did not want it for it was a tablet and she usually take a cap  I called patient back and she insisted that something is mixed up, she has always taken a tablet and he did not have this conversation with her. Patient will follow-up with pharmacy at 3 when she goes to pick-up Labetalol

## 2016-12-22 NOTE — Telephone Encounter (Signed)
No, to follow up in office with Dr Mariea Clonts as I advised, she does not wish to go to neurology at this time.

## 2016-12-26 ENCOUNTER — Other Ambulatory Visit: Payer: Self-pay | Admitting: *Deleted

## 2016-12-26 ENCOUNTER — Ambulatory Visit (INDEPENDENT_AMBULATORY_CARE_PROVIDER_SITE_OTHER): Payer: Medicare Other | Admitting: Physician Assistant

## 2016-12-26 ENCOUNTER — Ambulatory Visit: Payer: Medicare Other | Admitting: Physician Assistant

## 2016-12-26 ENCOUNTER — Encounter: Payer: Self-pay | Admitting: Physician Assistant

## 2016-12-26 VITALS — BP 144/90 | HR 76 | Ht 65.0 in | Wt 185.8 lb

## 2016-12-26 DIAGNOSIS — I10 Essential (primary) hypertension: Secondary | ICD-10-CM | POA: Diagnosis not present

## 2016-12-26 DIAGNOSIS — I4819 Other persistent atrial fibrillation: Secondary | ICD-10-CM

## 2016-12-26 DIAGNOSIS — I481 Persistent atrial fibrillation: Secondary | ICD-10-CM

## 2016-12-26 DIAGNOSIS — G459 Transient cerebral ischemic attack, unspecified: Secondary | ICD-10-CM | POA: Diagnosis not present

## 2016-12-26 DIAGNOSIS — F419 Anxiety disorder, unspecified: Secondary | ICD-10-CM

## 2016-12-26 MED ORDER — SPIRONOLACTONE 25 MG PO TABS
12.5000 mg | ORAL_TABLET | Freq: Every day | ORAL | 3 refills | Status: DC
Start: 2016-12-26 — End: 2017-02-12

## 2016-12-26 MED ORDER — FLUOXETINE HCL 10 MG PO TABS: 10.0000 mg | ORAL_TABLET | Freq: Every day | ORAL | 2 refills | Status: DC

## 2016-12-26 MED ORDER — SPIRONOLACTONE 25 MG PO TABS
25.0000 mg | ORAL_TABLET | Freq: Every day | ORAL | 3 refills | Status: DC
Start: 2016-12-26 — End: 2016-12-26

## 2016-12-26 NOTE — Patient Instructions (Addendum)
Medication Instructions:  1. START SPIRONOLACTONE 25 MG TABLET WITH THE DIRECTIONS TO TAKE 1/2 TABLET DAILY = 12.5 MG DAILY  Labwork: IN 1 WEEK YOU WILL NEED BMET IN 2 WEEK YOU WILL NEED BMET  Testing/Procedures: 1. Your physician has requested that you have a carotid duplex. This test is an ultrasound of the carotid arteries in your neck. It looks at blood flow through these arteries that supply the brain with blood. Allow one hour for this exam. There are no restrictions or special instructions.  2. Your physician has requested that you have an echocardiogram. Echocardiography is a painless test that uses sound waves to create images of your heart. It provides your doctor with information about the size and shape of your heart and how well your heart's chambers and valves are working. This procedure takes approximately one hour. There are no restrictions for this procedure.  Follow-Up: 1. SCOTT WEAVER, PAC IN 1 MONTH 2. YOU ARE BEING REFERRED TO NEUROLOGY; DX TIA  Any Other Special Instructions Will Be Listed Below (If Applicable).  If you need a refill on your cardiac medications before your next appointment, please call your pharmacy.

## 2016-12-26 NOTE — Telephone Encounter (Signed)
[facility] 

## 2016-12-26 NOTE — Progress Notes (Signed)
Cardiology Office Note:    Date:  12/26/2016   ID:  Tiffany Black, DOB 15-Nov-1933, MRN [id number]  PCP:  Tiffany Chandler, NP  Cardiologist/Electrophysiologist:  Dr. Virl Axe   Referring MD: Tiffany Chandler, NP   Chief Complaint  Patient presents with  . Hypertension; TIA    History of Present Illness:    Tiffany Black is a 83 y.o. female with a hx of persistent AF on Xarelto for AC, HTN, HL, prior CVA in 8/16.  She has been seen in the AF clinic in the past due to recurrent AF.  She was on low dose ASA in addition to anticoagulation for secondary stroke prevention.  The ASA was DC'd by Dr. Virl Axe in 1/17.  She has been managed with a rate control strategy.  Last seen in clinic by Tiffany Jester, PA-C in 2/18.  Metoprolol was increased due to uncontrolled blood pressure.  In review of her chart, she recently followed up with primary care and described symptoms that were concerning for a recurrent TIA. She was asked to follow-up with neurology. She preferred following up with cardiology.  CHADS2-VASc=6 (CVA, female, HTN, age > 54).  She is here alone. We spent a significant amount of time discussing her blood pressure. Her blood pressures are quite elevated early in the morning. She often has improvement in her blood pressure in the middle of the day. Blood pressure readings are somewhere in the 140s/80s-90s range. She denies chest pain. She denies significant dyspnea. She denies syncope. She denies orthopnea, PND. She does have mild ankle edema. Of note, she had significant issues with Lasix in the past causing hypokalemia. She had previously seen a physician at [facility] in their hypertension clinic for her blood pressure.  Prior CV studies:   The following studies were reviewed today:  Echo 8/16 Mild concentric LVH, EF 55-60, normal wall motion, grade 2 diastolic dysfunction, mild LAE, PASP 39  Carotid US 8/16 Findings consistent with 1-39 percent stenosis  involving the right internal carotid artery and the left internal carotid artery.  Carotid US 2/16 1-39% bilateral ICA stenosis.  Echo 11/15 Mild LVH, EF 55-60, normal wall motion, grade 2 diastolic dysfunction, mild LAE, normal RVSF, PASP 29  Echo (10/13):   EF 55% to 60%.  Left atrium: The atrium was mildly dilated.  Event Monitor (8/13):   NSR, PACs  Carotid US (12/08):   Mild bilateral carotid bifurcation and proximal ICA plaque without hemodynamically significant stenosis. Continued surveillance recommended   Past Medical History:  Diagnosis Date  . Anxiety   . Arthritis   . Bulging of cervical intervertebral disc   . Cardiomegaly   . Cataracts, bilateral   . Chronic neck pain   . History of endometrial cancer   . History of stroke    2015 and 2016 // carotid US 8/16: Bilateral 1-39 // carotid US 2/16: Bilateral ICA 1-39  . HLD (hyperlipidemia)   . Hx of echocardiogram    a. Echo (11/15): Mild LVH, EF 55-60%, normal wall motion, grade 2 diastolic dysfunction, mild LAE, normal RV function, PASP 29 mm Hg // echo 8/16:Mild concentric LVH, EF 55-60, normal wall motion, grade 2 diastolic dysfunction, mild LAE, PASP 39  . Hypertension   . Insomnia   . Lung nodule   . OA (osteoarthritis)   . Pectus excavatum   . Persistent atrial fibrillation ([place])   . Vertigo     Past Surgical History:  Procedure Laterality Date  . ABDOMINAL  HYSTERECTOMY  2011  . CATARACT EXTRACTION, BILATERAL Bilateral 2009  . CESAREAN SECTION  1975  . LAPAROSCOPIC HYSTERECTOMY  2011    Current Medications: Current Meds  Medication Sig  . acetaminophen (TYLENOL) 325 MG tablet Take 650 mg by mouth at bedtime as needed for headache (pain).   . cholecalciferol (VITAMIN D) 1000 UNITS tablet Take 1,000 Units by mouth daily after lunch.   . felodipine (PLENDIL) 5 MG 24 hr tablet Take 2.5 mg by mouth 2 (two) times daily.  Marland Kitchen FLUoxetine (PROZAC) 10 MG tablet Take 1 tablet (10 mg total) by mouth daily  after lunch.  . hydrocortisone (PROCTOSOL HC) 2.5 % rectal cream Place 1 application rectally daily as needed for hemorrhoids or itching.  . labetalol (NORMODYNE) 200 MG tablet Take 1 tablet (200 mg) by mouth every morning and 1.5 tablet (300 mg) after supper.  . meclizine (ANTIVERT) 25 MG tablet Take 25 mg by mouth daily as needed for dizziness.   . Menthol, Topical Analgesic, (BENGAY EX) Apply 1 application topically at bedtime as needed (neck pain).  . metoprolol (LOPRESSOR) 50 MG tablet Take 50 mg by mouth 2 (two) times daily as needed (HEART RATE).   Marland Kitchen potassium chloride SA (K-DUR,KLOR-CON) 20 MEQ tablet Take 10 mEq by mouth daily after lunch.  . valsartan (DIOVAN) 160 MG tablet Take 80 mg by mouth 2 (two) times daily.  Tiffany Black 20 MG TABS tablet Take 1 tablet (20 mg total) by mouth daily.  Marland Kitchen zolpidem (AMBIEN) 10 MG tablet Take 5 mg by mouth at bedtime.     Allergies:   Hydrochlorothiazide; Penicillins; Ciprofloxacin; Hydrocodone-acetaminophen; Iodine-131; Latex; Statins; Carbamazepine; Codeine; Sulfamethoxazole; and Sulfonamide derivatives   Social History   Social History  . Marital status: Married    Spouse name: N/A  . Number of children: N/A  . Years of education: N/A   Occupational History  . Retired    Social History Main Topics  . Smoking status: Never Smoker  . Smokeless tobacco: Never Used  . Alcohol use No  . Drug use: No  . Sexual activity: No   Other Topics Concern  . None   Social History Narrative   Diet: N/A   Caffeine: N/A   Married, if yes what year: Widow, married 1956   Do you live in a house, apartment, assisted living, condo, trailer, ect: House, one stories, 2 persons   Pets: None   Highest level of education: 12th Grade    Current/Past profession: Network engineer    Exercise: Yes, walking everyday    Living Will:    DNR:   POA/HPOA:   Functional Status:   Do you have difficulty bathing or dressing yourself? No   Do you have difficulty preparing  food or eating? No    Do you have difficulty managing your medications? No   Do you have difficulty managing your finances? No    Do you have difficulty affording your medications? Yes     Family History  Problem Relation Age of Onset  . Dementia Mother   . Heart attack Father   . Heart disease Father   . Stroke Maternal Uncle   . Hypertension Neg Hx      ROS:   Please see the history of present illness.    ROS All other systems reviewed and are negative.   EKGs/Labs/Other Test Reviewed:    EKG:  EKG is  ordered today.  The ekg ordered today demonstrates Atrial fibrillation, HR 76  Recent Labs:  12/15/2016: ALT 10; BUN 12; Creat 0.81; Potassium 4.2; Sodium 140 12/19/2016: Hemoglobin 13.5; Platelets 223   Recent Lipid Panel    Component Value Date/Time   CHOL 199 12/15/2016 0829   TRIG 178 (H) 12/15/2016 0829   HDL 28 (L) 12/15/2016 0829   CHOLHDL 7.1 (H) 12/15/2016 0829   VLDL 36 (H) 12/15/2016 0829   LDLCALC 135 (H) 12/15/2016 0829     Physical Exam:    VS:  BP (!) 144/90   Pulse 76   Ht 5\' 5"  (1.651 m)   Wt 185 lb 12.8 oz (84.3 kg)   BMI 30.92 kg/m     Wt Readings from Last 3 Encounters:  12/26/16 185 lb 12.8 oz (84.3 kg)  12/19/16 186 lb 6.4 oz (84.6 kg)  11/14/16 186 lb 6.4 oz (84.6 kg)     Physical Exam  Constitutional: She is oriented to person, place, and time. She appears well-developed and well-nourished. No distress.  HENT:  Head: Normocephalic and atraumatic.  Eyes: No scleral icterus.  Neck: Normal range of motion. No JVD present. Carotid bruit is not present.  Cardiovascular: Normal rate, S1 normal, S2 normal and normal heart sounds.  An irregularly irregular rhythm present.  No murmur heard. Pulmonary/Chest: Breath sounds normal. She has no wheezes. She has no rhonchi. She has no rales.  Abdominal: Soft. There is no tenderness.  Musculoskeletal: She exhibits edema (trace bilateral ankle).  Neurological: She is alert and oriented to person,  place, and time.  Skin: Skin is warm and dry.  Psychiatric: She has a normal mood and affect.    ASSESSMENT:    1. Transient cerebral ischemia, unspecified type   2. Essential hypertension   3. Persistent atrial fibrillation (HCC)    PLAN:    In order of problems listed above:  1. TIA -  She had symptoms last month that sound suspicious for a TIA. She has been adherent to Xarelto.  There has been some confusion regarding ASA Rx for her.  When she had her CVA, the neurologist recommended considering it but was concerned about bleeding risk.  Her HTN doctor in [place] has apparently recommended that she be on ASA.  When she last saw Dr. Caryl Comes, he stopped ASA due to increased bleeding risk without clear benefit.  I think she needs to see neurology to sort this all out.   -  Echo  -  Carotid US  -  Refer to Neuro  2. HTN -  BP is above target. We had a long discussion regarding management of her BP.  I have decided to put her on Spironolactone.  -  Spironolactone 12.5 mg QD  -  BMET q 1 week x 2  -  FU with me in 1 month  3. Persistent AF - Rate controlled.  Continue Xarelto.   Dispo:  Return in about 4 weeks (around 01/23/2017) for Close Follow Up, w/ Tiffany Dopp, PA-C.   Medication Adjustments/Labs and Tests Ordered: Current medicines are reviewed at length with the patient today.  Concerns regarding medicines are outlined above.  Medication changes, Labs and Tests ordered today are outlined in the Patient Instructions noted below. Patient Instructions  Medication Instructions:  1. START SPIRONOLACTONE 25 MG TABLET WITH THE DIRECTIONS TO TAKE 1/2 TABLET DAILY = 12.5 MG DAILY  Labwork: IN 1 WEEK YOU WILL NEED BMET IN 2 WEEK YOU WILL NEED BMET  Testing/Procedures: 1. Your physician has requested that you have a carotid duplex. This test is  an ultrasound of the carotid arteries in your neck. It looks at blood flow through these arteries that supply the brain with blood.  Allow one hour for this exam. There are no restrictions or special instructions.  2. Your physician has requested that you have an echocardiogram. Echocardiography is a painless test that uses sound waves to create images of your heart. It provides your doctor with information about the size and shape of your heart and how well your heart's chambers and valves are working. This procedure takes approximately one hour. There are no restrictions for this procedure.  Follow-Up: 1. Tiffany Black, PAC IN 1 MONTH 2. YOU ARE BEING REFERRED TO NEUROLOGY; DX TIA  Any Other Special Instructions Will Be Listed Below (If Applicable).  If you need a refill on your cardiac medications before your next appointment, please call your pharmacy.  Signed, Tiffany Dopp, PA-C  12/26/2016 4:47 PM    [facility] [address], [place], [place]  [address] Phone: [phone]; Fax: [phone]

## 2016-12-27 ENCOUNTER — Telehealth: Payer: Self-pay | Admitting: Physician Assistant

## 2016-12-27 DIAGNOSIS — I4819 Other persistent atrial fibrillation: Secondary | ICD-10-CM

## 2016-12-27 DIAGNOSIS — Z8673 Personal history of transient ischemic attack (TIA), and cerebral infarction without residual deficits: Secondary | ICD-10-CM

## 2016-12-27 NOTE — Telephone Encounter (Signed)
Arbie Cookey Can you follow up with Ms. S[place]ton about this? Thanks, Richardson Dopp, PA-C    12/27/2016 1:26 PM

## 2016-12-27 NOTE — Addendum Note (Signed)
Addended by: Michae Kava on: 12/27/2016 02:24 PM   Modules accepted: Orders

## 2016-12-27 NOTE — Telephone Encounter (Signed)
Pt s/w  [facility] to cancel her appts for test. Per Vaughan Basta patient had carotid scheduled for 12/29/2016 and ECHO scheduled for 01/01/2017, the patient call to cancel both the reason given was:  pt voiced she would like to cancel both test appts due to receiving a call about results from a test.  Per Richardson Dopp, PA I called the pt for further follow up on her cancelling her test. I s/w pt for approx 30 + minutes discussing as to the importance of the echo and carotid test to be done due to TIA that she reported to the PA. I explained to the pt that we want to make sure her heart valves are working like they should as well as to see if there is a possible blockage or narrowing in her carotids. Pt states she understands that .   Pt was very confused at times during our conversation. Pt would jump from one topic to another that sometimes had nothing to do with our call today. She states several time over does not understand why she is being referred to Neurology. I explained to the pt that she reports she had a TIA and does seem to have memory issues. Pt states yes she has a hard time remembering things.   Pt states to me so if she never mention the TIA to Richardson Dopp, PA she probably wouldn't be set up for all this testing. I answered the pt by stating I cannot say if yes or no if she would be having the test even if she did not mention the TIA. After a long talk with the pt, she is agreeable to come in for the echo and carotid to be done. Pt states she would like to see if these could be sometime in the morning or mid morning.   Pt also said she is probably not going to take the Spironolactone 12.5 mg daily that was prescribed at [facility] yesterday. Pt said she had no K+ when she took lasix and that [place] told her this is K+ sparing medication. I confirmed this with the pt that that is correct and that the PA feels she should be on the Spironolactone. I d/w pt that I will let the PA know that she may not take  the Spironolactone. Pt said thank you.  I advised the pt our office will call her and reschedule her test. Pt said thank you for my call.

## 2016-12-27 NOTE — Telephone Encounter (Signed)
[address] [place], [place]    12/27/2016 4:51 PM

## 2016-12-27 NOTE — Telephone Encounter (Signed)
Patient had carotid scheduled for 12/29/2016 and ECHO scheduled for 01/01/2017, the patient call to cancel both the reason given was:  pt voiced she would like to cancel both test appts due to receiving a call about results from a test.

## 2016-12-28 ENCOUNTER — Telehealth: Payer: Self-pay | Admitting: Physician Assistant

## 2016-12-28 NOTE — Telephone Encounter (Signed)
New message    Pt just called asking about her testing appts coming up.   Pt asked me to cancel her appts before she hung up the phone because she was confused. At first she wanted to talk to RN about what they were.

## 2016-12-28 NOTE — Telephone Encounter (Signed)
Spoke with pt and she states that she cancelled her two lab appts because she does not want to start the Spironolactone at this time.  Reminded pt of why Arlyce Harman was started and she states she checked her BP yesterday AM and it was 116/80.  She states she will start the medication and have the labs after the carotid and echo if it is still necessary.  Pt not comfortable starting med now because she has "so many problems with new medications".  Pt confused about carotid and echo appt.  Educated pt on these tests and the need for them.  Pt verbalized understanding and was in agreement to keep these appts.  Will route message to Richardson Dopp, PA-C and make him aware of pt's refusal to start Arlyce Harman.

## 2016-12-28 NOTE — Telephone Encounter (Signed)
Error

## 2016-12-28 NOTE — Telephone Encounter (Signed)
Left message to call back  

## 2016-12-28 NOTE — Telephone Encounter (Signed)
[place], [place]    12/28/2016 5:40 PM

## 2016-12-29 ENCOUNTER — Encounter (HOSPITAL_COMMUNITY): Payer: Medicare Other

## 2017-01-01 ENCOUNTER — Encounter (HOSPITAL_COMMUNITY): Payer: Medicare Other

## 2017-01-01 ENCOUNTER — Other Ambulatory Visit (HOSPITAL_COMMUNITY): Payer: Medicare Other

## 2017-01-04 ENCOUNTER — Other Ambulatory Visit: Payer: Medicare Other

## 2017-01-09 ENCOUNTER — Encounter: Payer: Self-pay | Admitting: Physician Assistant

## 2017-01-09 ENCOUNTER — Other Ambulatory Visit: Payer: Self-pay | Admitting: *Deleted

## 2017-01-09 MED ORDER — ZOLPIDEM TARTRATE 10 MG PO TABS: ORAL_TABLET | ORAL | 1 refills | Status: DC

## 2017-01-09 NOTE — Telephone Encounter (Signed)
Patient requested to be faxed to pharmacy 

## 2017-01-10 ENCOUNTER — Other Ambulatory Visit (HOSPITAL_COMMUNITY): Payer: Medicare Other

## 2017-01-11 ENCOUNTER — Other Ambulatory Visit: Payer: Medicare Other

## 2017-01-24 ENCOUNTER — Ambulatory Visit: Payer: Medicare Other | Admitting: Neurology

## 2017-01-26 ENCOUNTER — Ambulatory Visit: Payer: Medicare Other | Admitting: Physician Assistant

## 2017-02-02 ENCOUNTER — Ambulatory Visit: Payer: Medicare Other | Admitting: Internal Medicine

## 2017-02-05 ENCOUNTER — Other Ambulatory Visit: Payer: Self-pay | Admitting: Nurse Practitioner

## 2017-02-12 ENCOUNTER — Ambulatory Visit (INDEPENDENT_AMBULATORY_CARE_PROVIDER_SITE_OTHER): Payer: Medicare Other | Admitting: Internal Medicine

## 2017-02-12 ENCOUNTER — Encounter: Payer: Self-pay | Admitting: Internal Medicine

## 2017-02-12 VITALS — BP 130/80 | HR 83 | Temp 98.4°F | Wt 183.0 lb

## 2017-02-12 DIAGNOSIS — I491 Atrial premature depolarization: Secondary | ICD-10-CM | POA: Diagnosis not present

## 2017-02-12 DIAGNOSIS — I10 Essential (primary) hypertension: Secondary | ICD-10-CM

## 2017-02-12 DIAGNOSIS — Z23 Encounter for immunization: Secondary | ICD-10-CM

## 2017-02-12 DIAGNOSIS — I481 Persistent atrial fibrillation: Secondary | ICD-10-CM | POA: Diagnosis not present

## 2017-02-12 DIAGNOSIS — Z8673 Personal history of transient ischemic attack (TIA), and cerebral infarction without residual deficits: Secondary | ICD-10-CM

## 2017-02-12 DIAGNOSIS — I4819 Other persistent atrial fibrillation: Secondary | ICD-10-CM

## 2017-02-12 NOTE — Progress Notes (Addendum)
Location:  [facility] clinic Provider:  Leata Dominy L. Mariea Clonts, D.O., C.M.D.  Code Status: full code Goals of Care:  Advanced Directives 02/12/2017  Does Patient Have a Medical Advance Directive? No  Type of Advance Directive -  Does patient want to make changes to medical advance directive? -  Copy of [place] in Chart? -  Would patient like information on creating a medical advance directive? -  Pre-existing out of facility DNR order (yellow form or pink MOST form) -     Chief Complaint  Patient presents with  . Follow-up    6 week follow-up    HPI: Patient is a 83 y.o. female with h/o prior stroke in 2015, 2016, persistent afib, htn, hyperlipidemia, recent TIA seen today for medical management of chronic diseases.  She's had difficulty with hypotension per notes with vascular at [facility].  She was to have echo and carotid dopplers done.    Got 29/30 on mmse 12/19/16.  She says things are changing and getting more complex.    She has had vary unpredictable bp.  One day 97/70stg.  Had been on labetalol 300mg  at hs.  Cut down to 200mg  on her own b/c her bp was low.  Still responds to a change.  BP 130/80 this am which is good.  At 230am in the middle of the night last week, her heart was going crazy.  She was in an episode of afib.  EMS came and stayed for a bit.  She's had episodes of it off and on.  She does take her xarelto.  Now she is aware of the episodes and what they are.  Said she did function for 4 days, but was feeling it to some degree.  She had no chest pain with it.  Says she does get a little sob.    Has been on bp meds since she was 83 yo.  She is worried if she takes spironolactone, it will flush out her other medications.  She was on lasix 20mg  at one time.  It was doubled at one point.  She ended up in the hospital with hypokalemia.  She had to be hospitalized.  She has actually seen a "hypertension" specialist at [facility].  Her husband passed away and she has no  transportation out there.  She does drive short distances like to our office.   Says her husband had been very ill with lung cancer.  Her young son is 54 and lives at home.  She had to keep him in good spirits.  She is on prozac daily.  Has been on it for a year after her husband's death 2015/01/06).  If she skips a day, she can tell she forgot it.  She gets headaches.  Takes ambien 5mg  qhs most of the time, but takes the other half if she wakes up (so 10mg ) if she has several hours left to sleep.    Vertigo:  Has not needed meclizine in forever.  No recent spells.    She had her hysterectomy not for cancer, but for another reason.  Says she had a pain behind her neck long term.  Saw a neurosurgeon at [facility].  She then went to Dr. Ellene Route.  No treatment was offered, but she got a steroid shot in her neck at [place].  After the injection, she had a red place in her right groin that looked like blood under the skin.  She did not have vaginal bleeding.  She  went to Dr. Dema Severin who did a pap.  She said it was negative.  She then received a certified letter.  She rejected letter b/c she was not psychologically prepared to read it.  Says cancer got in her records b/c of this.  She had a hysterectomy in 2011 at [facility].  She thinks she got the hysterectomy b/c of what was going on in her neck that she got the steroid shot for and that she never had cancer.    Had the asian flu when she was young.  Since then, does not tolerate flu shots.  Has her appt with Dr. Caryl Comes next week.  Has not had her carotid doppler or echo done.  Encouraged her to do these.    Past Medical History:  Diagnosis Date  . Anxiety   . Arthritis   . Bulging of cervical intervertebral disc   . Cardiomegaly   . Cataracts, bilateral   . Chronic neck pain   . History of endometrial cancer   . History of stroke    2015 and 2016 // carotid US 8/16: Bilateral 1-39 // carotid US 2/16: Bilateral ICA 1-39  . HLD (hyperlipidemia)   . Hx of  echocardiogram    a. Echo (11/15): Mild LVH, EF 55-60%, normal wall motion, grade 2 diastolic dysfunction, mild LAE, normal RV function, PASP 29 mm Hg // echo 8/16:Mild concentric LVH, EF 55-60, normal wall motion, grade 2 diastolic dysfunction, mild LAE, PASP 39  . Hypertension   . Insomnia   . Lung nodule   . OA (osteoarthritis)   . Pectus excavatum   . Persistent atrial fibrillation ([place])   . Vertigo     Past Surgical History:  Procedure Laterality Date  . ABDOMINAL HYSTERECTOMY  2011  . CATARACT EXTRACTION, BILATERAL Bilateral 2009  . CESAREAN SECTION  1975  . LAPAROSCOPIC HYSTERECTOMY  2011    Allergies  Allergen Reactions  . Hydrochlorothiazide Other (See Comments) and Anaphylaxis    Unknown allergic reaction  . Penicillins Hives and Anaphylaxis    Has patient had a PCN reaction causing immediate rash, facial/tongue/throat swelling, SOB or lightheadedness with hypotension: Yes Has patient had a PCN reaction causing severe rash involving mucus membranes or skin necrosis: No Has patient had a PCN reaction that required hospitalization No Has patient had a PCN reaction occurring within the last 10 years: No If all of the above answers are "NO", then may proceed with Cephalosporin use.   . Ciprofloxacin Other (See Comments)    Blood pressure issues  . Hydrocodone-Acetaminophen Other (See Comments)    unknown  . Iodine-131 Other (See Comments)    Raises blood pressure  . Latex Itching  . Statins     Muscle weakness  . Carbamazepine Other (See Comments) and Rash    Flushed blood pressure medication out of system  . Codeine Nausea And Vomiting and Nausea Only    Nausea and vomiting  . Sulfamethoxazole Rash  . Sulfonamide Derivatives Hives    Hives     Allergies as of 02/12/2017      Reactions   Hydrochlorothiazide Other (See Comments), Anaphylaxis   Unknown allergic reaction   Penicillins Hives, Anaphylaxis   Has patient had a PCN reaction causing immediate rash,  facial/tongue/throat swelling, SOB or lightheadedness with hypotension: Yes Has patient had a PCN reaction causing severe rash involving mucus membranes or skin necrosis: No Has patient had a PCN reaction that required hospitalization No Has patient had a PCN reaction occurring within  the last 10 years: No If all of the above answers are "NO", then may proceed with Cephalosporin use.   Ciprofloxacin Other (See Comments)   Blood pressure issues   Hydrocodone-acetaminophen Other (See Comments)   unknown   Iodine-131 Other (See Comments)   Raises blood pressure   Latex Itching   Statins    Muscle weakness   Carbamazepine Other (See Comments), Rash   Flushed blood pressure medication out of system   Codeine Nausea And Vomiting, Nausea Only   Nausea and vomiting   Sulfamethoxazole Rash   Sulfonamide Derivatives Hives   Hives      Medication List       Accurate as of 02/12/17 11:07 AM. Always use your most recent med list.          acetaminophen 325 MG tablet Commonly known as:  TYLENOL Take 650 mg by mouth at bedtime as needed for headache (pain).   BENGAY EX Apply 1 application topically at bedtime as needed (neck pain).   cholecalciferol 1000 units tablet Commonly known as:  VITAMIN D Take 1,000 Units by mouth daily after lunch.   felodipine 5 MG 24 hr tablet Commonly known as:  PLENDIL TAKE 1/2 TABLET TWICE A DAY   FLUoxetine 10 MG tablet Commonly known as:  PROZAC Take 1 tablet (10 mg total) by mouth daily after lunch.   KLOR-CON M20 20 MEQ tablet Generic drug:  potassium chloride SA TAKE 1/2 TABLET ONCE A DAY   labetalol 200 MG tablet Commonly known as:  NORMODYNE Take 1 tablet (200 mg) by mouth every morning and 1.5 tablet (300 mg) after supper.   meclizine 25 MG tablet Commonly known as:  ANTIVERT Take 25 mg by mouth daily as needed for dizziness.   metoprolol tartrate 50 MG tablet Commonly known as:  LOPRESSOR Take 25 mg by mouth as needed (HEART  RATE).   PROCTOSOL HC 2.5 % rectal cream Generic drug:  hydrocortisone Place 1 application rectally daily as needed for hemorrhoids or itching.   valsartan 160 MG tablet Commonly known as:  DIOVAN Take 80 mg by mouth 2 (two) times daily.   XARELTO 20 MG Tabs tablet Generic drug:  rivaroxaban Take 1 tablet (20 mg total) by mouth daily.   zolpidem 10 MG tablet Commonly known as:  AMBIEN Take 1/2-1 tablet by mouth once daily at bedtime for rest       Review of Systems:  Review of Systems  Constitutional: Negative for chills, fever and malaise/fatigue.  Respiratory: Negative for cough and shortness of breath.   Cardiovascular: Positive for palpitations. Negative for chest pain, orthopnea, claudication and leg swelling.  Gastrointestinal: Negative for abdominal pain.  Genitourinary: Negative for dysuria.  Musculoskeletal: Negative for falls, joint pain and myalgias.  Skin: Negative for itching and rash.  Neurological: Positive for dizziness. Negative for loss of consciousness and weakness.  Psychiatric/Behavioral: Positive for memory loss. Negative for depression. The patient is nervous/anxious and has insomnia.     Health Maintenance  Topic Date Due  . PNA vac Low Risk Adult (1 of 2 - PCV13) 11/05/2000  . INFLUENZA VACCINE  04/11/2017  . TETANUS/TDAP  04/30/2023  . DEXA SCAN  Completed    Physical Exam: Vitals:   02/12/17 1049  BP: 130/80  Pulse: 83  Temp: 98.4 F (36.9 C)  TempSrc: Oral  SpO2: 96%  Weight: 183 lb (83 kg)   Body mass index is 30.45 kg/m. Physical Exam  Constitutional: She appears well-developed and well-nourished. No  distress.  Cardiovascular: Intact distal pulses.   irreg irreg  Pulmonary/Chest: Effort normal and breath sounds normal. No respiratory distress.  Abdominal: Bowel sounds are normal.  Musculoskeletal: Normal range of motion.  Skin: Skin is warm and dry.  Psychiatric: She has a normal mood and affect.    Labs reviewed: Basic  Metabolic Panel:  Recent Labs  12/15/16 0829  NA 140  K 4.2  CL 105  CO2 28  GLUCOSE 114*  BUN 12  CREATININE 0.81  CALCIUM 9.0   Liver Function Tests:  Recent Labs  12/15/16 0829  AST 17  ALT 10  ALKPHOS 62  BILITOT 0.6  PROT 6.4  ALBUMIN 3.9   No results for input(s): LIPASE, AMYLASE in the last 8760 hours. No results for input(s): AMMONIA in the last 8760 hours. CBC:  Recent Labs  12/19/16 1530  WBC 7.2  NEUTROABS 4,248  HGB 13.5  HCT 40.7  MCV 91.7  PLT 223   Lipid Panel:  Recent Labs  12/15/16 0829  CHOL 199  HDL 28*  LDLCALC 135*  TRIG 178*  CHOLHDL 7.1*   Lab Results  Component Value Date   HGBA1C 5.5 12/19/2016    Assessment/Plan 1. Essential hypertension -bp at goal today, but has been oscillating considerably -she's been waking with low bp so she's been afraid to take her meds until later -advised to take when over 660 systolic  2. Persistent atrial fibrillation (HCC) -ongoing, but having either PACs or spells of RVR  -had some and EMS was called -does not sound like she's thinking to take the metoprolol when this occurs--reminded of purpose -her son lives with her and may need to be involved in her care to help with medications   3. PAC (premature atrial contraction) -? These are cause of palpitations or RVR -has upcoming visit with Dr Caryl Comes   4. History of stroke -x2 and recent TIA per notes reviewed -she is resistant to getting the carotid doppler and echo -she says that she had too many things to do at once so she didn't do any of it  -seems she has some cognitive deficits though did score 29/30 in April on mmse  5. Need for vaccination with 13-polyvalent pneumococcal conjugate vaccine - Pneumococcal conjugate vaccine 13-valent--prevnar  Labs/tests ordered:   Orders Placed This Encounter  Procedures  . Pneumococcal conjugate vaccine 13-valent   Extensively discussed this question of whether she had endometrial cancer  or not as reason for her hysterectomy--need records from Dr. Harlan Stains, gyn-onc and Dr. Burke Keels who did surgery 8/12-8/14/11  Next appt:  3 mos with [name] for med mgt  Note that CMA was able to find and print out discharge summary from pt's hospitalization in 2011.  Pt did indeed have cancer. 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  She had hysteroscopy and d&c that confirmed the findings. She had a robotic hysterectomy, BSO and pelvic lymph node dissection and cystotomy which was oversewn 8/12.  Path also reviewed which showed carcinoma invading the myometrium 1.4cm out of 2.3cm thickness.  No lymphovascular invasion was IDd.  She had enodsalpingosis, leiomyomata, benign ovaries and fallopian tubes with partubal cysts.  Kaysey Berndt L. Vegas Coffin, D.O. [facility] [address], [place] [address] Cell Phone (Mon-Fri 8am-5pm):  [phone] On Call:  [phone] & follow prompts after 5pm & weekends Office Phone:  [phone] Office Fax:  [phone]

## 2017-02-21 ENCOUNTER — Ambulatory Visit (INDEPENDENT_AMBULATORY_CARE_PROVIDER_SITE_OTHER): Payer: Medicare Other | Admitting: Internal Medicine

## 2017-02-21 VITALS — BP 110/80 | HR 87 | Ht 65.0 in | Wt 184.0 lb

## 2017-02-21 DIAGNOSIS — I10 Essential (primary) hypertension: Secondary | ICD-10-CM

## 2017-02-21 DIAGNOSIS — I481 Persistent atrial fibrillation: Secondary | ICD-10-CM | POA: Diagnosis not present

## 2017-02-21 DIAGNOSIS — I4819 Other persistent atrial fibrillation: Secondary | ICD-10-CM

## 2017-02-21 DIAGNOSIS — G459 Transient cerebral ischemic attack, unspecified: Secondary | ICD-10-CM | POA: Diagnosis not present

## 2017-02-21 LAB — BASIC METABOLIC PANEL
BUN/Creatinine Ratio: 16 (ref 12–28)
BUN: 15 mg/dL (ref 8–27)
CALCIUM: 9.2 mg/dL (ref 8.7–10.3)
CO2: 25 mmol/L (ref 20–29)
CREATININE: 0.91 mg/dL (ref 0.57–1.00)
Chloride: 100 mmol/L (ref 96–106)
GFR, EST AFRICAN AMERICAN: 68 mL/min/{1.73_m2} (ref >59)
GFR, EST NON AFRICAN AMERICAN: 59 mL/min/{1.73_m2} — AB (ref >59)
Glucose: 98 mg/dL (ref 65–99)
Potassium: 4.6 mmol/L (ref 3.5–5.2)
Sodium: 139 mmol/L (ref 134–144)

## 2017-02-21 NOTE — Progress Notes (Signed)
T       Patient Care Team: Lauree Chandler, NP as PCP - General (Geriatric Medicine) Deboraha Sprang, MD as Consulting Physician (Cardiology) Shon Hough, MD as Consulting Physician (Ophthalmology) Sanjuana Mae, MD as Referring Physician (Vascular Surgery)   HPI  Tiffany Black is a 83 y.o. female Seen in followup for atrial fibrillation . She also has a history of hypertension  She underwent an event recorder October 2013 by Dr. Shirlee More. He demonstrated symptoms with sinus rhythm and symptoms with PACs and symptoms with a 3 beat run of atrial tachycardia.  All ECGs reviewed from 12/15 demonstrated atrial fibrillation which is presumably now permanent.  Spell earlier this year concerning for TIA   Saw SW  Notes reviewed   Echo 8/16 demonstrated normal LV function and mild LVH and left atrial enlargement Carotid < 40% bilateral stenosis  There has been differences of opinion on the role of ASA   Without vasuclar disease I see no indication   The patient denies chest pain, shortness of breath, nocturnal dyspnea, orthopnea or peripheral edema.  There have been no  lightheadedness or syncope.   Also had episode that promptoed a call to EMS with tachypalpiations  Resolved spontaneously after having awakened her from sleep  More frightening than anything else  Date Cr K Mg  4/18  0.81 4.2             Past Medical History:  Diagnosis Date  . Anxiety   . Arthritis   . Bulging of cervical intervertebral disc   . Cardiomegaly   . Cataracts, bilateral   . Chronic neck pain   . History of endometrial cancer   . History of stroke    2015 and 2016 // carotid US 8/16: Bilateral 1-39 // carotid US 2/16: Bilateral ICA 1-39  . HLD (hyperlipidemia)   . Hx of echocardiogram    a. Echo (11/15): Mild LVH, EF 55-60%, normal wall motion, grade 2 diastolic dysfunction, mild LAE, normal RV function, PASP 29 mm Hg // echo 8/16:Mild concentric LVH, EF 55-60, normal wall motion, grade 2  diastolic dysfunction, mild LAE, PASP 39  . Hypertension   . Insomnia   . Lung nodule   . OA (osteoarthritis)   . Pectus excavatum   . Persistent atrial fibrillation ([place])   . Vertigo     Past Surgical History:  Procedure Laterality Date  . ABDOMINAL HYSTERECTOMY  2011  . CATARACT EXTRACTION, BILATERAL Bilateral 2009  . CESAREAN SECTION  1975  . LAPAROSCOPIC HYSTERECTOMY  2011    Current Outpatient Prescriptions  Medication Sig Dispense Refill  . acetaminophen (TYLENOL) 325 MG tablet Take 650 mg by mouth at bedtime as needed for headache (pain).     . cholecalciferol (VITAMIN D) 1000 UNITS tablet Take 1,000 Units by mouth daily after lunch.     . felodipine (PLENDIL) 5 MG 24 hr tablet TAKE 1/2 TABLET TWICE A DAY 30 tablet 1  . FLUoxetine (PROZAC) 10 MG tablet Take 1 tablet (10 mg total) by mouth daily after lunch. 30 tablet 2  . hydrocortisone (PROCTOSOL HC) 2.5 % rectal cream Place 1 application rectally daily as needed for hemorrhoids or itching.    Marland Kitchen KLOR-CON M20 20 MEQ tablet TAKE 1/2 TABLET ONCE A DAY 15 tablet 1  . labetalol (NORMODYNE) 200 MG tablet Take 1 tablet (200 mg) by mouth every morning and 1.5 tablet (300 mg) after supper.    . meclizine (ANTIVERT) 25 MG tablet Take 25  mg by mouth daily as needed for dizziness.     . Menthol, Topical Analgesic, (BENGAY EX) Apply 1 application topically at bedtime as needed (neck pain).    . metoprolol (LOPRESSOR) 50 MG tablet Take 25 mg by mouth as needed (HEART RATE).     . valsartan (DIOVAN) 160 MG tablet Take 80 mg by mouth 2 (two) times daily.    Alveda Reasons 20 MG TABS tablet Take 1 tablet (20 mg total) by mouth daily. 30 tablet 11  . zolpidem (AMBIEN) 10 MG tablet Take 1/2-1 tablet by mouth once daily at bedtime for rest 30 tablet 1   No current facility-administered medications for this visit.     Allergies  Allergen Reactions  . Hydrochlorothiazide Other (See Comments) and Anaphylaxis    Unknown allergic reaction  .  Penicillins Hives and Anaphylaxis    Has patient had a PCN reaction causing immediate rash, facial/tongue/throat swelling, SOB or lightheadedness with hypotension: Yes Has patient had a PCN reaction causing severe rash involving mucus membranes or skin necrosis: No Has patient had a PCN reaction that required hospitalization No Has patient had a PCN reaction occurring within the last 10 years: No If all of the above answers are "NO", then may proceed with Cephalosporin use.   . Ciprofloxacin Other (See Comments)    Blood pressure issues  . Hydrocodone-Acetaminophen Other (See Comments)    unknown  . Iodine-131 Other (See Comments)    Raises blood pressure  . Latex Itching  . Statins     Muscle weakness  . Carbamazepine Other (See Comments) and Rash    Flushed blood pressure medication out of system  . Codeine Nausea And Vomiting and Nausea Only    Nausea and vomiting  . Sulfamethoxazole Rash  . Sulfonamide Derivatives Hives    Hives     Review of Systems negative except from HPI and PMH  Physical Exam BP 110/80   Pulse 87   Ht 5\' 5"  (1.651 m)   Wt 184 lb (83.5 kg)   SpO2 98%   BMI 30.62 kg/m  Well developed and nourished in no acute distress HENT normal Neck supple with JVP-flat Clear Regular rate and rhythm, no murmurs or gallops Abd-soft with active BS No Clubbing cyanosis edema Skin-warm and dry A & Oriented  Grossly normal sensory and motor function   ECG dated today demonstrates atrial fibrillation/flutter at 57  intervals-/08/33 Axis left -63  Assessment and  Plan  Hypertension  Atrial fibrillation - permanent  TIA  BP well controlled will d/c felodipine and initiate metoprolol 25 bid for HR  May be able to further downtitrate BP meds  Given concern of TIA will repeat dopplers though I thnk not likely bad  No indication for ASA  Will get echo with AF  We spent more than 50% of our >25 min visit in face to face counseling regarding the  above

## 2017-02-21 NOTE — Patient Instructions (Signed)
Medication Instructions: - Your physician has recommended you make the following change in your medication:  1) Increase metoprolol tartrate 50 mg- take 1/2 tablet (25 mg) by mouth twice daily 2) Stop felodipine  Labwork: - Your physician recommends that you have lab work today: [employer]   Procedures/Testing: - Your physician has requested that you have an echocardiogram- (active order per [employer]). Echocardiography is a painless test that uses sound waves to create images of your heart. It provides your doctor with information about the size and shape of your heart and how well your heart's chambers and valves are working. This procedure takes approximately one hour. There are no restrictions for this procedure.  - Your physician has requested that you have a carotid duplex- (active order per Nicki Reaper). This test is an ultrasound of the carotid arteries in your neck. It looks at blood flow through these arteries that supply the brain with blood. Allow one hour for this exam. There are no restrictions or special instructions.  Follow-Up: - Your physician recommends that you schedule a follow-up appointment in: 3 months with Richardson Dopp, PA  - Your physician wants you to follow-up in: 1 year with Dr. Caryl Comes. You will receive a reminder letter in the mail two months in advance. If you don't receive a letter, please call our office to schedule the follow-up appointment.    Any Additional Special Instructions Will Be Listed Below (If Applicable).     If you need a refill on your cardiac medications before your next appointment, please call your pharmacy.

## 2017-03-04 ENCOUNTER — Emergency Department (HOSPITAL_COMMUNITY)
Admission: EM | Admit: 2017-03-04 | Discharge: 2017-03-04 | Disposition: A | Discharge status: Home / Self Care | Payer: Medicare Other | Attending: Emergency Medicine | Admitting: Emergency Medicine

## 2017-03-04 ENCOUNTER — Encounter (HOSPITAL_COMMUNITY): Payer: Self-pay | Admitting: Emergency Medicine

## 2017-03-04 DIAGNOSIS — Z8542 Personal history of malignant neoplasm of other parts of uterus: Secondary | ICD-10-CM | POA: Insufficient documentation

## 2017-03-04 DIAGNOSIS — Y929 Unspecified place or not applicable: Secondary | ICD-10-CM | POA: Insufficient documentation

## 2017-03-04 DIAGNOSIS — Z8673 Personal history of transient ischemic attack (TIA), and cerebral infarction without residual deficits: Secondary | ICD-10-CM | POA: Insufficient documentation

## 2017-03-04 DIAGNOSIS — Y9389 Activity, other specified: Secondary | ICD-10-CM | POA: Diagnosis not present

## 2017-03-04 DIAGNOSIS — S161XXA Strain of muscle, fascia and tendon at neck level, initial encounter: Secondary | ICD-10-CM | POA: Diagnosis not present

## 2017-03-04 DIAGNOSIS — Z79899 Other long term (current) drug therapy: Secondary | ICD-10-CM | POA: Diagnosis not present

## 2017-03-04 DIAGNOSIS — Z7901 Long term (current) use of anticoagulants: Secondary | ICD-10-CM | POA: Diagnosis not present

## 2017-03-04 DIAGNOSIS — I10 Essential (primary) hypertension: Secondary | ICD-10-CM | POA: Diagnosis not present

## 2017-03-04 DIAGNOSIS — Z91040 Latex allergy status: Secondary | ICD-10-CM | POA: Diagnosis not present

## 2017-03-04 DIAGNOSIS — X509XXA Other and unspecified overexertion or strenuous movements or postures, initial encounter: Secondary | ICD-10-CM | POA: Diagnosis not present

## 2017-03-04 DIAGNOSIS — Y999 Unspecified external cause status: Secondary | ICD-10-CM | POA: Insufficient documentation

## 2017-03-04 DIAGNOSIS — S199XXA Unspecified injury of neck, initial encounter: Secondary | ICD-10-CM | POA: Diagnosis present

## 2017-03-04 MED ORDER — TRAMADOL HCL 50 MG PO TABS: 50.0000 mg | ORAL_TABLET | Freq: Four times a day (QID) | ORAL | 0 refills | Status: DC | PRN

## 2017-03-04 NOTE — ED Provider Notes (Signed)
[facility] DEPT Provider Note   CSN: [id number] Arrival date & time: 03/04/17  6948     History   Chief Complaint Chief Complaint  Patient presents with  . Neck Injury    HPI Tiffany Black is a 83 y.o. female.  Patient with several complaints. Main complaint is that she started with some left lateral neck pain last evening was quite severe when she laid down to go to bed. Patient's also been having some intermittent hallucinations occurring mostly at night. Patient followed by cardiology for atrial fibrillation. Patient is on blood thinners. Patient's neck last night, her on both sides but more so to the left and was painful to move. Denies any numbness or weakness to her upper extremities. Patient's had a history of some mini strokes in the past. No chest pain no rapid heart rate no significant shortness of breath      Past Medical History:  Diagnosis Date  . Anxiety   . Arthritis   . Bulging of cervical intervertebral disc   . Cardiomegaly   . Cataracts, bilateral   . Chronic neck pain   . History of endometrial cancer   . History of stroke    2015 and 2016 // carotid US 8/16: Bilateral 1-39 // carotid US 2/16: Bilateral ICA 1-39  . HLD (hyperlipidemia)   . Hx of echocardiogram    a. Echo (11/15): Mild LVH, EF 55-60%, normal wall motion, grade 2 diastolic dysfunction, mild LAE, normal RV function, PASP 29 mm Hg // echo 8/16:Mild concentric LVH, EF 55-60, normal wall motion, grade 2 diastolic dysfunction, mild LAE, PASP 39  . Hypertension   . Insomnia   . Lung nodule   . OA (osteoarthritis)   . Pectus excavatum   . Persistent atrial fibrillation ([place])   . Vertigo     Patient Active Problem List   Diagnosis Date Noted  . Chest pain 05/23/2015  . Essential hypertension   . History of stroke 05/10/2015  . Dizziness 05/10/2015  . Orthostatic hypotension 05/10/2015  . CVA (cerebral infarction) 05/10/2015  . Hypokalemia 09/30/2013  . Vertigo 02/28/2013  .  Sinusitis 02/28/2013  . Anxiety 02/28/2013  . PAC (premature atrial contraction) 04/10/2012  . Abnormal EKG 01/19/2012  . Persistent atrial fibrillation ([place]) 02/15/2010    Past Surgical History:  Procedure Laterality Date  . ABDOMINAL HYSTERECTOMY  2011  . CATARACT EXTRACTION, BILATERAL Bilateral 2009  . CESAREAN SECTION  1975  . LAPAROSCOPIC HYSTERECTOMY  2011    OB History    No data available       Home Medications    Prior to Admission medications   Medication Sig Start Date End Date Taking? Authorizing Provider  acetaminophen (TYLENOL) 325 MG tablet Take 650 mg by mouth at bedtime as needed for headache (pain).    Yes [provider]  cholecalciferol (VITAMIN D) 1000 UNITS tablet Take 1,000 Units by mouth daily after lunch.    Yes [provider]  felodipine (PLENDIL) 5 MG 24 hr tablet Take 2.5 mg by mouth 2 (two) times daily. 02/07/17  Yes [provider]  FLUoxetine (PROZAC) 10 MG tablet Take 1 tablet (10 mg total) by mouth daily after lunch. 12/26/16  Yes Lauree Chandler, NP  KLOR-CON M20 20 MEQ tablet TAKE 1/2 TABLET ONCE A DAY 02/06/17  Yes Lauree Chandler, NP  labetalol (NORMODYNE) 200 MG tablet Take 1 tablet (200 mg) by mouth every morning and 1 tablet (200 mg) after supper.   Yes [provider]  Menthol, Topical Analgesic, (BENGAY EX) Apply 1 application topically at bedtime as needed (neck pain).   Yes [provider]  metoprolol (LOPRESSOR) 50 MG tablet Take 50 mg by mouth 2 (two) times daily as needed (HEART RATE). Take 1/2 tablet (25 mg) by mouth twice daily   Yes [provider]  valsartan (DIOVAN) 160 MG tablet Take 80 mg by mouth 2 (two) times daily.   Yes [provider]  XARELTO 20 MG TABS tablet Take 1 tablet (20 mg total) by mouth daily. 04/18/16  Yes Deboraha Sprang, MD  zolpidem (AMBIEN) 10 MG tablet Take 1/2-1 tablet by mouth once daily at bedtime for rest 01/09/17  Yes Lauree Chandler, NP   hydrocortisone (PROCTOSOL HC) 2.5 % rectal cream Place 1 application rectally daily as needed for hemorrhoids or itching.    [provider]  meclizine (ANTIVERT) 25 MG tablet Take 25 mg by mouth daily as needed for dizziness.     [provider]  traMADol (ULTRAM) 50 MG tablet Take 1 tablet (50 mg total) by mouth every 6 (six) hours as needed. 03/04/17   Fredia Sorrow, MD    Family History Family History  Problem Relation Age of Onset  . Dementia Mother   . Heart attack Father   . Heart disease Father   . Stroke Maternal Uncle   . Hypertension Neg Hx     Social History Social History  Substance Use Topics  . Smoking status: Never Smoker  . Smokeless tobacco: Never Used  . Alcohol use No     Allergies   Hydrochlorothiazide; Penicillins; Ciprofloxacin; Hydrocodone-acetaminophen; Iodine-131; Latex; Statins; Carbamazepine; Codeine; Sulfamethoxazole; and Sulfonamide derivatives   Review of Systems Review of Systems  HENT: Negative for congestion.   Eyes: Negative for redness.  Respiratory: Negative for shortness of breath.   Cardiovascular: Positive for palpitations. Negative for leg swelling.  Gastrointestinal: Negative for abdominal pain.  Genitourinary: Negative for dysuria.  Musculoskeletal: Positive for neck pain.  Skin: Negative for rash.  Neurological: Negative for syncope.  Hematological: Bruises/bleeds easily.  Psychiatric/Behavioral: Positive for hallucinations. Negative for confusion.     Physical Exam Updated Vital Signs BP (!) 146/77 (BP Location: Left Arm)   Pulse 83   Temp 97.8 F (36.6 C) (Oral)   Resp 17   Ht 1.651 m (5\' 5" )   Wt 83 kg (183 lb)   SpO2 96%   BMI 30.45 kg/m   Physical Exam  Constitutional: She is oriented to person, place, and time. She appears well-developed and well-nourished. No distress.  HENT:  Head: Normocephalic and atraumatic.  Right Ear: External ear normal.  Left Ear: External ear normal.    Mouth/Throat: Oropharynx is clear and moist. No oropharyngeal exudate.  Eyes: Conjunctivae and EOM are normal. Pupils are equal, round, and reactive to light.  Neck: Normal range of motion. Neck supple.  Cardiovascular: Normal rate, regular rhythm and normal heart sounds.   Pulmonary/Chest: Effort normal and breath sounds normal. No respiratory distress.  Abdominal: Soft. Bowel sounds are normal. There is no tenderness.  Musculoskeletal: Normal range of motion.  Neurological: She is alert and oriented to person, place, and time. No cranial nerve deficit or sensory deficit. She exhibits normal muscle tone. Coordination normal.  Skin: Skin is warm. No rash noted. No erythema.  Nursing note and vitals reviewed.    ED Treatments / Results  Labs (all labs ordered are listed, but only abnormal results are displayed) Labs Reviewed - No  data to display  EKG  EKG Interpretation None       Radiology No results found.  Procedures Procedures (including critical care time)  Medications Ordered in ED Medications - No data to display   Initial Impression / Assessment and Plan / ED Course  I have reviewed the triage vital signs and the nursing notes.  Pertinent labs & imaging results that were available during my care of the patient were reviewed by me and considered in my medical decision making (see chart for details).     Patient with complaint of increase of left lateral neck pain started last evening got much worse when she laid down to go to bed. This morning does feel better. Patient did rub BenGay on it. Still with some mild discomfort with movement of the neck to the right side along the anterior border of the trapezius muscle on the left side. No neurovascular deficits. Patient also gives a history of some hallucinations that it been occurring intermittently. Patient has a known history of atrial fibrillation on monitor she is in atrial fibrillation here rate is controlled.  Patient is on blood thinners. Patient closely followed by cardiology.  Recommend follow-up with neurology will treat with tramadol as needed. Patient will return for any new or worse symptoms. Patient nontoxic no acute distress.   Final Clinical Impressions(s) / ED Diagnoses   Final diagnoses:  Strain of neck muscle, initial encounter    New Prescriptions New Prescriptions   TRAMADOL (ULTRAM) 50 MG TABLET    Take 1 tablet (50 mg total) by mouth every 6 (six) hours as needed.     Fredia Sorrow, MD 03/04/17 [phone]

## 2017-03-04 NOTE — ED Triage Notes (Signed)
Pt. Stated, I ve had a stiff neck before and last night if flared up again. I used [employer].

## 2017-03-04 NOTE — Discharge Instructions (Signed)
Follow through with the studies that cardiology has planned. Make an appointment to follow-up with neurology for the neck pain. And for the hallucinations. Take tramadol as needed for increased pain in the neck area. Return for any new or worse symptoms.

## 2017-03-07 ENCOUNTER — Other Ambulatory Visit (HOSPITAL_COMMUNITY): Payer: Medicare Other

## 2017-03-12 ENCOUNTER — Encounter (HOSPITAL_COMMUNITY): Payer: Medicare Other

## 2017-03-15 ENCOUNTER — Other Ambulatory Visit: Payer: Self-pay

## 2017-03-15 MED ORDER — VALSARTAN 160 MG PO TABS
80.0000 mg | ORAL_TABLET | Freq: Two times a day (BID) | ORAL | 0 refills | Status: DC
Start: 2017-03-15 — End: 2017-07-18

## 2017-03-16 ENCOUNTER — Other Ambulatory Visit (HOSPITAL_COMMUNITY): Payer: Medicare Other

## 2017-03-16 ENCOUNTER — Telehealth: Payer: Self-pay

## 2017-03-16 ENCOUNTER — Ambulatory Visit (HOSPITAL_COMMUNITY)
Admission: EM | Admit: 2017-03-16 | Discharge: 2017-03-16 | Disposition: A | Discharge status: Home / Self Care | Payer: Medicare Other | Attending: Internal Medicine | Admitting: Internal Medicine

## 2017-03-16 ENCOUNTER — Encounter (HOSPITAL_COMMUNITY): Payer: Self-pay

## 2017-03-16 DIAGNOSIS — S01511A Laceration without foreign body of lip, initial encounter: Secondary | ICD-10-CM

## 2017-03-16 DIAGNOSIS — S0990XA Unspecified injury of head, initial encounter: Secondary | ICD-10-CM

## 2017-03-16 NOTE — ED Provider Notes (Signed)
CSN: [id number]     Arrival date & time 03/16/17  2778 History   First MD Initiated Contact with Patient 03/16/17 1059     Chief Complaint  Patient presents with  . Head Injury  . Lip Laceration   (Consider location/radiation/quality/duration/timing/severity/associated sxs/prior Treatment) HPI  Tiffany Black is a 83 y.o. female presenting to UC with c/o laceration to the Right side of her lower lip that occurred earlier today.  Pt notes she hit her head with her car door on accident while opening it to get a box of tissues.  She was hit on the Right side of her head but denies LOC. Denies dizziness or change in vision. Denies pain at area she was hit but notes she bit her lower lip at the same time. She is concerned about a scar on her lower lip.  She is on Xarelto. Bleeding controlled PTA.  Pt contacted her PCP first, but they directed her to UC as there were no available appointments with them today.     Past Medical History:  Diagnosis Date  . Anxiety   . Arthritis   . Bulging of cervical intervertebral disc   . Cardiomegaly   . Cataracts, bilateral   . Chronic neck pain   . History of endometrial cancer   . History of stroke    2015 and 2016 // carotid US 8/16: Bilateral 1-39 // carotid US 2/16: Bilateral ICA 1-39  . HLD (hyperlipidemia)   . Hx of echocardiogram    a. Echo (11/15): Mild LVH, EF 55-60%, normal wall motion, grade 2 diastolic dysfunction, mild LAE, normal RV function, PASP 29 mm Hg // echo 8/16:Mild concentric LVH, EF 55-60, normal wall motion, grade 2 diastolic dysfunction, mild LAE, PASP 39  . Hypertension   . Insomnia   . Lung nodule   . OA (osteoarthritis)   . Pectus excavatum   . Persistent atrial fibrillation ([place])   . Vertigo    Past Surgical History:  Procedure Laterality Date  . ABDOMINAL HYSTERECTOMY  2011  . CATARACT EXTRACTION, BILATERAL Bilateral 2009  . CESAREAN SECTION  1975  . LAPAROSCOPIC HYSTERECTOMY  2011   Family History  Problem  Relation Age of Onset  . Dementia Mother   . Heart attack Father   . Heart disease Father   . Stroke Maternal Uncle   . Hypertension Neg Hx    Social History  Substance Use Topics  . Smoking status: Never Smoker  . Smokeless tobacco: Never Used  . Alcohol use No   OB History    No data available     Review of Systems  HENT: Negative for dental problem and facial swelling.   Skin: Positive for wound. Negative for color change.  Neurological: Negative for dizziness, syncope, light-headedness and headaches.    Allergies  Hydrochlorothiazide; Penicillins; Ciprofloxacin; Hydrocodone-acetaminophen; Iodine-131; Latex; Statins; Carbamazepine; Codeine; Sulfamethoxazole; and Sulfonamide derivatives  Home Medications   Prior to Admission medications   Medication Sig Start Date End Date Taking? Authorizing Provider  felodipine (PLENDIL) 5 MG 24 hr tablet Take 2.5 mg by mouth 2 (two) times daily. 02/07/17  Yes [provider]  FLUoxetine (PROZAC) 10 MG tablet Take 1 tablet (10 mg total) by mouth daily after lunch. 12/26/16  Yes Lauree Chandler, NP  labetalol (NORMODYNE) 200 MG tablet Take 1 tablet (200 mg) by mouth every morning and 1 tablet (200 mg) after supper.   Yes [provider]  metoprolol (LOPRESSOR) 50 MG tablet Take 50 mg  by mouth 2 (two) times daily as needed (HEART RATE). Take 1/2 tablet (25 mg) by mouth twice daily   Yes [provider]  valsartan (DIOVAN) 160 MG tablet Take 0.5 tablets (80 mg total) by mouth 2 (two) times daily. Please keep 05/25/17 appt for further refills 03/15/17  Yes Weaver, Scott T, PA-C  XARELTO 20 MG TABS tablet Take 1 tablet (20 mg total) by mouth daily. 04/18/16  Yes Deboraha Sprang, MD  zolpidem (AMBIEN) 10 MG tablet Take 1/2-1 tablet by mouth once daily at bedtime for rest 01/09/17  Yes Lauree Chandler, NP  acetaminophen (TYLENOL) 325 MG tablet Take 650 mg by mouth at bedtime as needed for headache (pain).     [provider]  cholecalciferol (VITAMIN D) 1000 UNITS tablet Take 1,000 Units by mouth daily after lunch.     [provider]  hydrocortisone (PROCTOSOL HC) 2.5 % rectal cream Place 1 application rectally daily as needed for hemorrhoids or itching.    [provider]  KLOR-CON M20 20 MEQ tablet TAKE 1/2 TABLET ONCE A DAY 02/06/17   Lauree Chandler, NP  meclizine (ANTIVERT) 25 MG tablet Take 25 mg by mouth daily as needed for dizziness.     [provider]  Menthol, Topical Analgesic, (BENGAY EX) Apply 1 application topically at bedtime as needed (neck pain).    [provider]  traMADol (ULTRAM) 50 MG tablet Take 1 tablet (50 mg total) by mouth every 6 (six) hours as needed. 03/04/17   Fredia Sorrow, MD   Meds Ordered and Administered this Visit  Medications - No data to display  BP (!) 142/91 (BP Location: Left Arm) Comment: reported elevated BP to nurse Traci Bast  Pulse 95   Temp 98.2 F (36.8 C) (Oral)   Resp 16   SpO2 97%  No data found.   Physical Exam  Constitutional: She is oriented to person, place, and time. She appears well-developed and well-nourished.  HENT:  Head: Normocephalic.    Mouth/Throat: Uvula is midline, oropharynx is clear and moist and mucous membranes are normal. Normal dentition.    Lower lip, Right side- 63mm superficial abrasion. No active bleeding. No foreign bodies seen or palpated.  No wounds to oral mucosa.  No dental injuries.  Teeth in tact and stable.   Right side of head (location pt was hit by door): skin in tact. No ecchymosis, hematoma, or crepitus.   Eyes: EOM are normal.  Neck: Normal range of motion.  Cardiovascular: Normal rate.   Pulmonary/Chest: Effort normal.  Musculoskeletal: Normal range of motion.  Neurological: She is alert and oriented to person, place, and time.  Speech is clear. Alert to person, place, time and event.   Skin: Skin is warm and dry.  Psychiatric: She has a normal mood  and affect. Her behavior is normal.  Nursing note and vitals reviewed.   Urgent Care Course     Procedures (including critical care time)  Labs Review Labs Reviewed - No data to display  Imaging Review No results found.   MDM   1. Lip laceration, initial encounter   2. Minor head injury, initial encounter    Pt concerned about laceration to her lip. Wound is superficial. No skin available to indicate suture closure of wound.  Wound does not cross [place] border.  No through and through wound.  No evidence of trauma on Right side of scalp where pt states she was hit. Area she points to is  non-tender.  Reassured pt the lip should heal within 1 week. May apply a small amount of antimitotic ointment  Avoid sun exposure until healed to help prevent scaring.  F/u with PCP as needed.   Noe Gens, [place] 03/16/17 [phone]

## 2017-03-16 NOTE — Telephone Encounter (Signed)
Noted  

## 2017-03-16 NOTE — Telephone Encounter (Signed)
Patient stated that she was outside working and she started coughing and went to car to get some tissue and in the process the car door hit her head and she bit her lip. She stated that she is also on Xarelto. Her tetanus shot is up to date. Advised the patient to go to urgent care due to there being no availability on the schedule today.

## 2017-03-16 NOTE — ED Triage Notes (Signed)
Pt hit her head on her car door and in the process bit her lip. She is on xarelto and just wanted to get it checked out. Said her head isn't hurting and no knot.

## 2017-03-19 ENCOUNTER — Other Ambulatory Visit: Payer: Self-pay

## 2017-03-19 MED ORDER — MECLIZINE HCL 25 MG PO TABS: 25.0000 mg | ORAL_TABLET | Freq: Every day | ORAL | 0 refills | Status: DC | PRN

## 2017-03-19 NOTE — Telephone Encounter (Signed)
A fax was received from [facility] requesting a refill of medlizine 25 mg tablets.   Rx sent to pharmacy electronically.

## 2017-03-22 ENCOUNTER — Other Ambulatory Visit: Payer: Self-pay | Admitting: Nurse Practitioner

## 2017-03-28 ENCOUNTER — Telehealth (HOSPITAL_COMMUNITY): Payer: Self-pay | Admitting: Internal Medicine

## 2017-03-28 NOTE — Telephone Encounter (Signed)
Pt has cancelled appts on 6/26,7/6, and 7/20.   She will be removed from the workqueue.

## 2017-03-30 ENCOUNTER — Other Ambulatory Visit (HOSPITAL_COMMUNITY): Payer: Medicare Other

## 2017-03-31 ENCOUNTER — Other Ambulatory Visit: Payer: Self-pay | Admitting: Nurse Practitioner

## 2017-04-02 ENCOUNTER — Telehealth: Payer: Self-pay | Admitting: Internal Medicine

## 2017-04-02 DIAGNOSIS — I4819 Other persistent atrial fibrillation: Secondary | ICD-10-CM

## 2017-04-02 NOTE — Telephone Encounter (Signed)
New message    Pt is calling to rs echo appt. Pt needs new order for echo, her order expired.

## 2017-04-02 NOTE — Telephone Encounter (Signed)
There is an order in Epic for echo placed 02/21/17 that expires 05/24/18.

## 2017-04-02 NOTE — Telephone Encounter (Signed)
DONE

## 2017-04-02 NOTE — Telephone Encounter (Signed)
Based on OV note for 02/21/17 from [facility], the felodipine has been discontinued. The medication list is being updated based on this change.

## 2017-04-03 ENCOUNTER — Other Ambulatory Visit: Payer: Self-pay | Admitting: Nurse Practitioner

## 2017-04-05 ENCOUNTER — Telehealth: Payer: Self-pay | Admitting: *Deleted

## 2017-04-05 ENCOUNTER — Telehealth: Payer: Self-pay | Admitting: Internal Medicine

## 2017-04-05 MED ORDER — FELODIPINE ER 5 MG PO TB24
ORAL_TABLET | ORAL | 6 refills | Status: DC
Start: 2017-04-05 — End: 2017-05-08

## 2017-04-05 NOTE — Telephone Encounter (Signed)
I called and spoke with the patient.  Dr. Caryl Comes advised her to stop felodipine at her last office visit on 02/21/17 and start metoprolol tartrate 25 mg BID. She states that she did not mention to Dr. Caryl Comes that she was very hesitant to hold felodipine.  She continued to take this since she feels that it controlled her BP so well. She only takes metoprolol if her HR is > 87 bpm and does not have to do this very often. She was calling today as she needs her felodipine refilled - I advised I would review with Dr. Caryl Comes- she would prefer to continue PRN metoprolol. Her HR's typically run in the 70-80 range.  I advised I will call her back- she is agreeable.

## 2017-04-05 NOTE — Telephone Encounter (Signed)
That's fine to refill felodipine

## 2017-04-05 NOTE — Telephone Encounter (Signed)
Patient called and stated that her medication Felodipine ER 5mg  was denied.  Reviewed chart and Dr. Caryl Comes discontinued this medication on 6/13.  Patient notified and she stated that she is waiting for nurse from Dr. Olin Pia office to call her back.

## 2017-04-05 NOTE — Telephone Encounter (Signed)
I have spoken with the patient and she is aware her felodipine has been refilled to [facility].

## 2017-04-05 NOTE — Telephone Encounter (Signed)
New message   Pt is calling asking for a call back. She said she is trying to get her medication straightened out. Please call.

## 2017-04-07 ENCOUNTER — Other Ambulatory Visit: Payer: Self-pay | Admitting: Nurse Practitioner

## 2017-04-07 DIAGNOSIS — F419 Anxiety disorder, unspecified: Secondary | ICD-10-CM

## 2017-04-09 ENCOUNTER — Other Ambulatory Visit: Payer: Self-pay | Admitting: *Deleted

## 2017-04-09 DIAGNOSIS — F419 Anxiety disorder, unspecified: Secondary | ICD-10-CM

## 2017-04-09 MED ORDER — FLUOXETINE HCL 10 MG PO TABS: ORAL_TABLET | ORAL | 2 refills | Status: DC

## 2017-04-09 NOTE — Telephone Encounter (Signed)
[facility] 

## 2017-04-10 ENCOUNTER — Other Ambulatory Visit: Payer: Self-pay

## 2017-04-10 ENCOUNTER — Encounter (INDEPENDENT_AMBULATORY_CARE_PROVIDER_SITE_OTHER): Payer: Self-pay

## 2017-04-10 ENCOUNTER — Other Ambulatory Visit: Payer: Self-pay | Admitting: *Deleted

## 2017-04-10 ENCOUNTER — Ambulatory Visit (HOSPITAL_COMMUNITY): Payer: Medicare Other | Attending: Cardiology

## 2017-04-10 DIAGNOSIS — I481 Persistent atrial fibrillation: Secondary | ICD-10-CM | POA: Insufficient documentation

## 2017-04-10 DIAGNOSIS — I951 Orthostatic hypotension: Secondary | ICD-10-CM | POA: Diagnosis not present

## 2017-04-10 DIAGNOSIS — Z8673 Personal history of transient ischemic attack (TIA), and cerebral infarction without residual deficits: Secondary | ICD-10-CM | POA: Insufficient documentation

## 2017-04-10 DIAGNOSIS — I348 Other nonrheumatic mitral valve disorders: Secondary | ICD-10-CM | POA: Diagnosis not present

## 2017-04-10 DIAGNOSIS — I119 Hypertensive heart disease without heart failure: Secondary | ICD-10-CM | POA: Insufficient documentation

## 2017-04-10 DIAGNOSIS — I4819 Other persistent atrial fibrillation: Secondary | ICD-10-CM

## 2017-04-10 MED ORDER — HYDROCORTISONE 2.5 % RE CREA: 1.0000 "application " | TOPICAL_CREAM | Freq: Every day | RECTAL | 1 refills | Status: DC | PRN

## 2017-04-10 MED ORDER — PERFLUTREN LIPID MICROSPHERE
1.0000 mL | INTRAVENOUS | Status: AC | PRN
  Administered 2017-04-10: 2 mL via INTRAVENOUS

## 2017-04-10 NOTE — Telephone Encounter (Signed)
Patient requested refill to be sent to [facility] 

## 2017-04-20 ENCOUNTER — Telehealth: Payer: Self-pay | Admitting: Internal Medicine

## 2017-04-20 NOTE — Telephone Encounter (Signed)
New message   Pt states she has not receieved the results from her Echo yet and want to know why

## 2017-04-20 NOTE — Telephone Encounter (Signed)
To Dr. Caryl Comes- please review echo.

## 2017-04-20 NOTE — Telephone Encounter (Signed)
The patient is aware of her results.  

## 2017-04-25 ENCOUNTER — Ambulatory Visit (HOSPITAL_COMMUNITY)
Admission: RE | Admit: 2017-04-25 | Discharge: 2017-04-25 | Disposition: A | Discharge status: Home / Self Care | Payer: Medicare Other | Source: Ambulatory Visit | Attending: Cardiovascular Disease | Admitting: Cardiovascular Disease

## 2017-04-25 DIAGNOSIS — G459 Transient cerebral ischemic attack, unspecified: Secondary | ICD-10-CM | POA: Insufficient documentation

## 2017-04-25 DIAGNOSIS — I6523 Occlusion and stenosis of bilateral carotid arteries: Secondary | ICD-10-CM | POA: Diagnosis not present

## 2017-05-01 ENCOUNTER — Other Ambulatory Visit: Payer: Self-pay | Admitting: Internal Medicine

## 2017-05-01 ENCOUNTER — Telehealth: Payer: Self-pay | Admitting: *Deleted

## 2017-05-01 DIAGNOSIS — Z8673 Personal history of transient ischemic attack (TIA), and cerebral infarction without residual deficits: Secondary | ICD-10-CM

## 2017-05-01 NOTE — Telephone Encounter (Signed)
-----   Message from Deboraha Sprang, MD sent at 04/28/2017  1:05 PM EDT ----- Please Inform Patient that study was nearly normal   Great report  Thanks

## 2017-05-01 NOTE — Telephone Encounter (Signed)
Pt last saw Dr Caryl Comes 02/21/17, last labs 02/21/17 Creat 0.91, age 83, weight 83kg, CrCl 63.53, based on CrCl pt is on appropriate dosage of Xarelto 20mg  QD.  Will refill rx.

## 2017-05-08 ENCOUNTER — Telehealth: Payer: Self-pay | Admitting: Internal Medicine

## 2017-05-08 NOTE — Telephone Encounter (Signed)
Pt states she took 1/2 of 50 mg metoprolol about an hour ago because heart rate  101 heart rate now 86.

## 2017-05-08 NOTE — Telephone Encounter (Signed)
Pt states BP now  99/74 Pulse 86, pt states only symptoms are tiredness and weakness, a little dizzy.

## 2017-05-08 NOTE — Telephone Encounter (Signed)
Reviewed with Dr Radford Pax--  Hold felodipine until BP comes back up, symptoms have resolved.

## 2017-05-08 NOTE — Telephone Encounter (Signed)
Per Dr Ronni Rumble BP comes up and symptoms have resolved start back on felodipine 2.5 mg daily.  I mentioned to pt that valsartan had been recalled, pt states she is taking brand name Diovan she is not taking valsartan. Pt states she had refilled  Diovan yesterday. Megan pharmacist unsure if Diovan is part of recall, advised pt to check with her pharmacy, pt states she will check with pharmacy.

## 2017-05-08 NOTE — Telephone Encounter (Signed)
Patient stated her blood pressure is better at BP 127/75 . Patient wanted a referral to neurology with Dr. Jannifer Franklin, her previous referral, put in by Richardson Dopp PA on 12/26/16, has expired. Will put referral in again. Went over what Dr. Radford Pax, DOD, instructed for patient to do earlier today, see phone note 05/08/17. Patient verbalized understanding.

## 2017-05-08 NOTE — Telephone Encounter (Signed)
New message    Pt is calling asking for a call back. She said she spoke with someone today. Please call.

## 2017-05-08 NOTE — Telephone Encounter (Signed)
Pt c/o BP issue:  1. What are your last 5 BP readings? 96/73, 97-70 sitting 2. Are you having any other symptoms (ex. Dizziness, headache, blurred vision, passed out)? Dizziness yesterday and today, sweating 3. What is your medication issue? none   pls call [phone]

## 2017-05-15 ENCOUNTER — Other Ambulatory Visit: Payer: Self-pay | Admitting: *Deleted

## 2017-05-15 DIAGNOSIS — F419 Anxiety disorder, unspecified: Secondary | ICD-10-CM

## 2017-05-15 MED ORDER — FLUOXETINE HCL 10 MG PO TABS: ORAL_TABLET | ORAL | 2 refills | Status: DC

## 2017-05-15 NOTE — Telephone Encounter (Signed)
Patient requested refill also wanted the "blue" tablet. Refill sent to pharmacy.

## 2017-05-16 NOTE — Telephone Encounter (Signed)
Spoke with Almyra Free at [facility]) [phone] and Fluoxetine Formulary Exception APPROVED through 09/10/2017. Member ID: [id number]  Called and informed [facility]. Called patient but voicemail not set up and cannot leave message.

## 2017-05-17 ENCOUNTER — Ambulatory Visit (INDEPENDENT_AMBULATORY_CARE_PROVIDER_SITE_OTHER): Payer: Medicare Other | Admitting: Nurse Practitioner

## 2017-05-17 ENCOUNTER — Encounter: Payer: Self-pay | Admitting: Nurse Practitioner

## 2017-05-17 VITALS — BP 118/82 | HR 91 | Temp 98.2°F | Resp 18 | Ht 65.0 in | Wt 183.8 lb

## 2017-05-17 DIAGNOSIS — Z9189 Other specified personal risk factors, not elsewhere classified: Secondary | ICD-10-CM

## 2017-05-17 DIAGNOSIS — F419 Anxiety disorder, unspecified: Secondary | ICD-10-CM | POA: Diagnosis not present

## 2017-05-17 DIAGNOSIS — E2839 Other primary ovarian failure: Secondary | ICD-10-CM | POA: Diagnosis not present

## 2017-05-17 DIAGNOSIS — I4819 Other persistent atrial fibrillation: Secondary | ICD-10-CM

## 2017-05-17 DIAGNOSIS — I481 Persistent atrial fibrillation: Secondary | ICD-10-CM

## 2017-05-17 DIAGNOSIS — Z8673 Personal history of transient ischemic attack (TIA), and cerebral infarction without residual deficits: Secondary | ICD-10-CM | POA: Diagnosis not present

## 2017-05-17 DIAGNOSIS — C541 Malignant neoplasm of endometrium: Secondary | ICD-10-CM | POA: Diagnosis not present

## 2017-05-17 DIAGNOSIS — I10 Essential (primary) hypertension: Secondary | ICD-10-CM
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
## 2017-05-23 ENCOUNTER — Other Ambulatory Visit: Payer: Self-pay | Admitting: Nurse Practitioner

## 2017-05-25 ENCOUNTER — Ambulatory Visit: Payer: Medicare Other | Admitting: Physician Assistant

## 2017-06-03 ENCOUNTER — Other Ambulatory Visit: Payer: Self-pay | Admitting: Internal Medicine

## 2017-06-19 NOTE — Progress Notes (Signed)
Cardiology Office Note:    Date:  06/20/2017   ID:  Tiffany Black, DOB March 13, 1934, MRN [id number]  PCP:  Lauree Chandler, NP  Cardiologist:  Dr. Virl Axe    Referring MD: Lauree Chandler, NP   Chief Complaint  Patient presents with  . Follow-up    Atrial fibrillation    History of Present Illness:    Tiffany Black is a 83 y.o. female with a hx of permanent atrial fibrillation on Xarelto for anticoagulation, hypertension, hyperlipidemia, prior stroke in 8/16.  She has been seen in the AF clinic in the past due to recurrent atrial fibrillation.  She has been managed with a rate control strategy.  Earlier this year, she had symptoms that were concerning for a recurrent TIA. She was asked to follow-up with neurology.   Last seen by Dr. Virl Axe in 02/2017.  Review of her chart indicates that she has called in recently with low blood pressures. Her medications have been adjusted.  CHADS2-VASc=6 (CVA, female, HTN, age > 71).   Ms. Tiffany Black returns for follow up.  She is here alone.  She has not had any chest pain, shortness of breath, paroxysmal nocturnal dyspnea, edema.  She denies syncope.  She has noted excessive sweating when she works in her yard.    Prior CV studies:   The following studies were reviewed today:  Carotid US 04/25/17 bilat ICA 1-39  Echo 04/10/17 Mild LVH, EF 55-60, no RWMA, MAC, mild BAE, PASP 40  Echo 8/16 Mild concentric LVH, EF 55-60, normal wall motion, grade 2 diastolic dysfunction, mild LAE, PASP 39  Carotid US 8/16 Findings consistent with 1-39 percent stenosis involving theright internal carotid artery and the left internal carotidartery.  Carotid US 2/16 1-39% bilateral ICA stenosis.  Echo 11/15 Mild LVH, EF 55-60, normal wall motion, grade 2 diastolic dysfunction, mild LAE, normal RVSF, PASP 29  Echo (10/13):  EF 55% to 60%. Left atrium: The atrium was mildly dilated.  Event Monitor (8/13):  NSR, PACs  Carotid US  (12/08):  Mild bilateral carotid bifurcation and proximal ICA plaque without hemodynamically significant stenosis. Continued surveillance recommended  Past Medical History:  Diagnosis Date  . Anxiety   . Arthritis   . Bulging of cervical intervertebral disc   . Cardiomegaly   . Cataracts, bilateral   . Chronic neck pain   . History of endometrial cancer   . History of stroke    2015 and 2016 // carotid US 8/16: Bilateral 1-39 // carotid US 2/16: Bilateral ICA 1-39  . HLD (hyperlipidemia)   . Hx of echocardiogram    a. Echo (11/15): Mild LVH, EF 55-60%, normal wall motion, grade 2 diastolic dysfunction, mild LAE, normal RV function, PASP 29 mm Hg // echo 8/16:Mild concentric LVH, EF 55-60, normal wall motion, grade 2 diastolic dysfunction, mild LAE, PASP 39  . Hypertension   . Insomnia   . Lung nodule   . OA (osteoarthritis)   . Pectus excavatum   . Persistent atrial fibrillation ([place])   . Vertigo     Past Surgical History:  Procedure Laterality Date  . ABDOMINAL HYSTERECTOMY  2011  . CATARACT EXTRACTION, BILATERAL Bilateral 2009  . CESAREAN SECTION  1975  . LAPAROSCOPIC HYSTERECTOMY  2011    Current Medications: Current Meds  Medication Sig  . acetaminophen (TYLENOL) 325 MG tablet Take 650 mg by mouth at bedtime as needed for headache (pain).   . cholecalciferol (VITAMIN D) 1000 UNITS tablet Take 1,000 Units  by mouth daily after lunch.   . felodipine (PLENDIL) 5 MG 24 hr tablet 1/2 tablet (2.5 mg) daily  . FLUoxetine (PROZAC) 10 MG tablet Take one tablet by mouth once daily after lunch  . hydrocortisone (ANUSOL-HC) 2.5 % rectal cream Place 1 application rectally as needed for hemorrhoids or anal itching.  Marland Kitchen KLOR-CON M20 20 MEQ tablet TAKE 1/2 TABLET ONCE A DAY  . labetalol (NORMODYNE) 200 MG tablet Take 1 tablet (200 mg) by mouth every morning and 1 tablet (200 mg) after supper.  . meclizine (ANTIVERT) 25 MG tablet Take 1 tablet (25 mg total) by mouth daily as needed for  dizziness.  . Menthol, Topical Analgesic, (BENGAY EX) Apply 1 application topically at bedtime as needed (neck pain).  . metoprolol tartrate (LOPRESSOR) 50 MG tablet Take 1/2 tablet (25 mg) by mouth twice daily as needed for elevated heart rates  . valsartan (DIOVAN) 160 MG tablet Take 0.5 tablets (80 mg total) by mouth 2 (two) times daily. Please keep 05/25/17 appt for further refills  . XARELTO 20 MG TABS tablet TAKE 1 TABLET DAILY  . zolpidem (AMBIEN) 10 MG tablet Take 10 mg by mouth at bedtime as needed for sleep.     Allergies:   Hydrochlorothiazide; Penicillins; Ciprofloxacin; Hydrocodone-acetaminophen; Iodine-131; Latex; Statins; Carbamazepine; Codeine; Sulfamethoxazole; and Sulfonamide derivatives   Social History   Social History  . Marital status: Married    Spouse name: N/A  . Number of children: N/A  . Years of education: N/A   Occupational History  . Retired    Social History Main Topics  . Smoking status: Never Smoker  . Smokeless tobacco: Never Used  . Alcohol use No  . Drug use: No  . Sexual activity: No   Other Topics Concern  . None   Social History Narrative   Diet: N/A   Caffeine: N/A   Married, if yes what year: Widow, married 1956   Do you live in a house, apartment, assisted living, condo, trailer, ect: House, one stories, 2 persons   Pets: None   Highest level of education: 12th Grade    Current/Past profession: Network engineer    Exercise: Yes, walking everyday    Living Will:    DNR:   POA/HPOA:   Functional Status:   Do you have difficulty bathing or dressing yourself? No   Do you have difficulty preparing food or eating? No    Do you have difficulty managing your medications? No   Do you have difficulty managing your finances? No    Do you have difficulty affording your medications? Yes     Family Hx: The patient's family history includes Dementia in her mother; Heart attack in her father; Heart disease in her father; Stroke in her maternal  uncle. There is no history of Hypertension.  ROS:   Please see the history of present illness.    ROS All other systems reviewed and are negative.   EKGs/Labs/Other Test Reviewed:    EKG:  EKG is not ordered today.  The ekg ordered today demonstrates n/a  Recent Labs: 12/15/2016: ALT 10 12/19/2016: Hemoglobin 13.5; Platelets 223 02/21/2017: BUN 15; Creatinine, Ser 0.91; Potassium 4.6; Sodium 139   Recent Lipid Panel Lab Results  Component Value Date/Time   CHOL 199 12/15/2016 08:29 AM   TRIG 178 (H) 12/15/2016 08:29 AM   HDL 28 (L) 12/15/2016 08:29 AM   CHOLHDL 7.1 (H) 12/15/2016 08:29 AM   LDLCALC 135 (H) 12/15/2016 08:29 AM    Physical  Exam:    VS:  BP 138/82   Pulse 74   Ht 5' 5"  (1.651 m)   Wt 181 lb 12.8 oz (82.5 kg)   SpO2 96%   BMI 30.25 kg/m     Wt Readings from Last 3 Encounters:  06/20/17 181 lb 12.8 oz (82.5 kg)  05/17/17 183 lb 12.8 oz (83.4 kg)  03/04/17 183 lb (83 kg)     Physical Exam  Constitutional: She is oriented to person, place, and time. She appears well-developed and well-nourished. No distress.  HENT:  Head: Normocephalic and atraumatic.  Neck: No JVD present.  Cardiovascular: Normal rate.  An irregularly irregular rhythm present.  No murmur heard. Pulmonary/Chest: Effort normal. She has no rales.  Abdominal: Soft.  Musculoskeletal: She exhibits no edema.  Neurological: She is alert and oriented to person, place, and time.  Skin: Skin is warm and dry.  Psychiatric: She has a normal mood and affect.    ASSESSMENT:    1. Permanent atrial fibrillation ([address])   2. Essential hypertension   3. History of TIA (transient ischemic attack)    PLAN:    In order of problems listed above:  1. Permanent atrial fibrillation (HCC)  Rate control strategy.  HR is controlled.  She is tolerating anticoagulation with Xarelto.  Continue current medications.  Obtain follow up BMET, CBC today.  She has noted excessive sweating. I will also get a  TSH.  2. Essential hypertension The patient's blood pressure is controlled on her current regimen.  Continue current therapy.    3. History of TIA (transient ischemic attack) She has an evaluation with Neurology later this month.    Dispo:  Return in about 8 months (around 02/18/2018) for Routine Follow Up w/ Dr. Caryl Comes.   Medication Adjustments/Labs and Tests Ordered: Current medicines are reviewed at length with the patient today.  Concerns regarding medicines are outlined above.  Tests Ordered: Orders Placed This Encounter  Procedures  . Basic Metabolic Panel (BMET)  . CBC  . TSH   Medication Changes: No orders of the defined types were placed in this encounter.   Signed, Richardson Dopp, PA-C  06/20/2017 10:29 AM    [facility] [address], [place], [place]  [address] Phone: [phone]; Fax: [phone]

## 2017-06-20 ENCOUNTER — Ambulatory Visit (INDEPENDENT_AMBULATORY_CARE_PROVIDER_SITE_OTHER): Payer: Medicare Other | Admitting: Physician Assistant

## 2017-06-20 ENCOUNTER — Encounter: Payer: Self-pay | Admitting: Physician Assistant

## 2017-06-20 VITALS — BP 138/82 | HR 74 | Ht 65.0 in | Wt 181.8 lb

## 2017-06-20 DIAGNOSIS — I10 Essential (primary) hypertension: Secondary | ICD-10-CM | POA: Diagnosis not present

## 2017-06-20 DIAGNOSIS — Z8673 Personal history of transient ischemic attack (TIA), and cerebral infarction without residual deficits: Secondary | ICD-10-CM | POA: Diagnosis not present

## 2017-06-20 DIAGNOSIS — I482 Chronic atrial fibrillation: Secondary | ICD-10-CM

## 2017-06-20 DIAGNOSIS — I4821 Permanent atrial fibrillation: Secondary | ICD-10-CM

## 2017-06-20 NOTE — Patient Instructions (Signed)
Medication Instructions:  Your physician recommends that you continue on your current medications as directed. Please refer to the Current Medication list given to you today.    Labwork: TODAY: BMET, CBC, TSH,  Testing/Procedures: NONE ORDERED  Follow-Up: Your physician wants you to follow-up XN:ATFT 2019 WITH DR. Caryl Comes. You will receive a reminder letter in the mail two months in advance. If you don't receive a letter, please call our office to schedule the follow-up appointment.   Any Other Special Instructions Will Be Listed Below (If Applicable).     If you need a refill on your cardiac medications before your next appointment, please call your pharmacy.

## 2017-06-21 ENCOUNTER — Telehealth: Payer: Self-pay | Admitting: *Deleted

## 2017-06-21 LAB — CBC
HEMATOCRIT: 39.7 % (ref 34.0–46.6)
Hemoglobin: 13.5 g/dL (ref 11.1–15.9)
MCH: 30.8 pg (ref 26.6–33.0)
MCHC: 34 g/dL (ref 31.5–35.7)
MCV: 91 fL (ref 79–97)
PLATELETS: 199 10*3/uL (ref 150–379)
RBC: 4.38 x10E6/uL (ref 3.77–5.28)
RDW: 13.9 % (ref 12.3–15.4)
WBC: 7.1 10*3/uL (ref 3.4–10.8)

## 2017-06-21 LAB — BASIC METABOLIC PANEL
BUN/Creatinine Ratio: 12 (ref 12–28)
BUN: 12 mg/dL (ref 8–27)
CALCIUM: 9.4 mg/dL (ref 8.7–10.3)
CHLORIDE: 102 mmol/L (ref 96–106)
CO2: 27 mmol/L (ref 20–29)
Creatinine, Ser: 1.03 mg/dL — ABNORMAL HIGH (ref 0.57–1.00)
GFR calc Af Amer: 59 mL/min/{1.73_m2} — ABNORMAL LOW (ref >59)
GFR, EST NON AFRICAN AMERICAN: 51 mL/min/{1.73_m2} — AB (ref >59)
GLUCOSE: 95 mg/dL (ref 65–99)
POTASSIUM: 4 mmol/L (ref 3.5–5.2)
Sodium: 142 mmol/L (ref 134–144)

## 2017-06-21 LAB — TSH: TSH: 2.71 u[IU]/mL (ref 0.450–4.500)

## 2017-06-21 NOTE — Telephone Encounter (Signed)
-----   Message from Leeroy Bock, [facility] sent at 06/21/2017 10:47 AM EDT ----- Should be ok to continue brand Diovan. Pharmacy should have notified all pts affected by recall. If she has not heard from them, she can continue her Diovan.

## 2017-06-21 NOTE — Telephone Encounter (Signed)
I called pt to advise ok per [facility], Pharm-D to continue on brand name Diovan. Pt thanked me for my call back today.

## 2017-06-21 NOTE — Telephone Encounter (Signed)
-----   Message from Liliane Shi, [place] sent at 06/21/2017  9:08 AM EDT ----- Please call the patient Kidney function is stable.  The hemoglobin and TSH are normal. I notice she is on Valsartan. I did not address this at appointment yesterday.  We should change her b/c of the recall.  PLAN:  1. DC Valsartan 2. Start Losartan 25 mg Twice daily  3. BMET 2 weeks after starting Losartan.  Richardson Dopp, PA-C    06/21/2017 9:08 AM

## 2017-06-21 NOTE — Telephone Encounter (Signed)
Pt has been notified of lab results by phone with verbal understanding. I went over recommendation per Richardson Dopp, PA to d/c Valsartan due to recall and start Losartan 25 mg BID with BMET to be done in 2 weeks after starting Losartan. Pt is adamant that she is not taking Valsartan and that she is taking Diovan. I explained to the pt that Diovan is the brand name of the medication. Pt said yes and that this is different from the Valsartan. Pt said she is sure she would have been notified if there was a problem with the Diovan. Pt said she has taken Cozaar in the past and she cannot remember why it was changed however she is not going to change her medication from the [place]. She states she is doing well. She does state her BP is a little higher in the AM. I asked pt if she waiting about 1 1/2 -2 hours after taking BP med to take BP, pt answered no she takes it right away. I ask pt to try waiting 1 1/2-2 hours after taking her BP meds, which will help the doctor know if the medication is working and is the right medication for her. Pt said to me "people always say old people don't know what they are doing." I stated to the pt that I am not saying and that we are just wanting to make sure we are caring for her needs, including her medications. Pt also states to me that she said she "saw Nicki Reaper a few months ago and that it was not Nicki Reaper though they had Scott's name badge on." I asked her what did this person look like. Pt answered "this person was bigger than Scott and that Nicki Reaper is a bit shorter and athletic body type." I tried to assure pt that no one had taken Scott's badge and try to imposter him." I advised pt that I will feel better d/w Richardson Dopp, PA and Pharm-D ok to still take the Diovan. I explained that we just want to make sure her BP medication is ok for her.

## 2017-07-02 ENCOUNTER — Encounter: Payer: Self-pay | Admitting: Neurology

## 2017-07-02 ENCOUNTER — Ambulatory Visit (INDEPENDENT_AMBULATORY_CARE_PROVIDER_SITE_OTHER): Payer: Medicare Other | Admitting: Neurology

## 2017-07-02 VITALS — BP 150/90 | HR 77 | Ht 65.0 in | Wt 182.5 lb

## 2017-07-02 DIAGNOSIS — G4489 Other headache syndrome: Secondary | ICD-10-CM

## 2017-07-02 MED ORDER — BUTALBITAL-APAP-CAFFEINE 50-325-40 MG PO TABS: 1.0000 | ORAL_TABLET | Freq: Four times a day (QID) | ORAL | 1 refills | Status: DC | PRN

## 2017-07-02 NOTE — Progress Notes (Signed)
Reason for visit: Headache  Referring physician: Dr. Everardo Beals Killilea is a 83 y.o. female  History of present illness:  Ms. Carr is an 19 year old right-handed white female with a 50 year history of headaches. The patient indicates that she began having headaches when she was in her 42s. She claims that the headaches always tend to start in the left occipital area associated with a dull achy pain that may go into the left ear and then radiate into the left frontotemporal area and around the left eye. The patient does not get photophobia or phonophobia with headache, she reports no nausea or vomiting or visual disturbances. The headache may last all day until she puts BenGay on the left occipital area and she is able to get to sleep. If she can get to sleep, when she wakes up the headache is completely gone. The patient denies any other associated symptoms of numbness, weakness, speech disturbance or problems with swallowing. She does have some left sided neck pain at times. She denies any balance problems or difficulty controlling the bowels or bladder. She denies any family history of headache. The headaches will tend to occur once every 2 or 4 weeks. The patient has undergone a CT scan of brain in 2016, MRI of the brain also was done in 2016 and she has had a cervical spine MRI done on 12/03/2014 by Dr. Ellene Route. She underwent an epidural steroid injection in 2016, this did not help her headaches. The patient claims that the onset of the headache 50 years ago was preceded by an electrical sound in her left ear unassociated with any pain. This has not recurred.  Past Medical History:  Diagnosis Date  . Anxiety   . Arthritis   . Bulging of cervical intervertebral disc   . Cardiomegaly   . Cataracts, bilateral   . Chronic neck pain   . History of endometrial cancer   . History of stroke    2015 and 2016 // carotid US 8/16: Bilateral 1-39 // carotid US 2/16: Bilateral ICA 1-39  . HLD  (hyperlipidemia)   . Hx of echocardiogram    a. Echo (11/15): Mild LVH, EF 55-60%, normal wall motion, grade 2 diastolic dysfunction, mild LAE, normal RV function, PASP 29 mm Hg // echo 8/16:Mild concentric LVH, EF 55-60, normal wall motion, grade 2 diastolic dysfunction, mild LAE, PASP 39  . Hypertension   . Insomnia   . Lung nodule   . OA (osteoarthritis)   . Pectus excavatum   . Persistent atrial fibrillation ([place])   . Vertigo     Past Surgical History:  Procedure Laterality Date  . ABDOMINAL HYSTERECTOMY  2011  . CATARACT EXTRACTION, BILATERAL Bilateral 2009  . CESAREAN SECTION  1975  . LAPAROSCOPIC HYSTERECTOMY  2011    Family History  Problem Relation Age of Onset  . Dementia Mother   . Heart attack Father   . Heart disease Father   . Stroke Maternal Uncle   . Hypertension Neg Hx     Social history:  reports that she has never smoked. She has never used smokeless tobacco. She reports that she does not drink alcohol or use drugs.  Medications:  Prior to Admission medications   Medication Sig Start Date End Date Taking? Authorizing Provider  acetaminophen (TYLENOL) 325 MG tablet Take 650 mg by mouth at bedtime as needed for headache (pain).    Yes [provider]  cholecalciferol (VITAMIN D) 1000 UNITS tablet Take 1,000  Units by mouth daily after lunch.    Yes [provider]  felodipine (PLENDIL) 5 MG 24 hr tablet 1/2 tablet (2.5 mg) daily 05/08/17  Yes Turner, Eber Hong, MD  FLUoxetine (PROZAC) 10 MG tablet Take one tablet by mouth once daily after lunch 05/15/17  Yes Eubanks, Carlos American, NP  hydrocortisone (ANUSOL-HC) 2.5 % rectal cream Place 1 application rectally as needed for hemorrhoids or anal itching.   Yes [provider]  KLOR-CON M20 20 MEQ tablet TAKE 1/2 TABLET ONCE A DAY 06/04/17  Yes Lauree Chandler, NP  labetalol (NORMODYNE) 200 MG tablet Take 1 tablet (200 mg) by mouth every morning and 1 tablet (200 mg) after supper.   Yes  [provider]  meclizine (ANTIVERT) 25 MG tablet Take 1 tablet (25 mg total) by mouth daily as needed for dizziness. 03/19/17  Yes Lauree Chandler, NP  Menthol, Topical Analgesic, (BENGAY EX) Apply 1 application topically at bedtime as needed (neck pain).   Yes [provider]  metoprolol tartrate (LOPRESSOR) 50 MG tablet Take 1/2 tablet (25 mg) by mouth twice daily as needed for elevated heart rates 04/05/17  Yes Deboraha Sprang, MD  valsartan (DIOVAN) 160 MG tablet Take 0.5 tablets (80 mg total) by mouth 2 (two) times daily. Please keep 05/25/17 appt for further refills 03/15/17  Yes Weaver, Scott T, PA-C  XARELTO 20 MG TABS tablet TAKE 1 TABLET DAILY 05/01/17  Yes Deboraha Sprang, MD  zolpidem (AMBIEN) 10 MG tablet Take 10 mg by mouth at bedtime as needed for sleep.   Yes [provider]      Allergies  Allergen Reactions  . Hydrochlorothiazide Other (See Comments) and Anaphylaxis    Unknown allergic reaction  . Penicillins Hives and Anaphylaxis    Has patient had a PCN reaction causing immediate rash, facial/tongue/throat swelling, SOB or lightheadedness with hypotension: Yes Has patient had a PCN reaction causing severe rash involving mucus membranes or skin necrosis: No Has patient had a PCN reaction that required hospitalization No Has patient had a PCN reaction occurring within the last 10 years: No If all of the above answers are "NO", then may proceed with Cephalosporin use.   . Ciprofloxacin Other (See Comments)    Blood pressure issues  . Hydrocodone-Acetaminophen Other (See Comments)    unknown  . Iodine-131 Other (See Comments)    Raises blood pressure  . Latex Itching  . Statins     Muscle weakness  . Carbamazepine Other (See Comments) and Rash    Flushed blood pressure medication out of system  . Codeine Nausea And Vomiting and Nausea Only    Nausea and vomiting  . Sulfamethoxazole Rash  . Sulfonamide Derivatives Hives    Hives      ROS:  Out of a complete 14 system review of symptoms, the patient complains only of the following symptoms, and all other reviewed systems are negative.  Headache  Blood pressure (!) 150/90, pulse 77, height 5\' 5"  (1.651 m), weight 182 lb 8 oz (82.8 kg), SpO2 96 %.  Physical Exam  General: The patient is alert and cooperative at the time of the examination.  Eyes: Pupils are equal, round, and reactive to light. Discs are flat bilaterally.  Ears: Tympanic membranes are clear bilaterally.  Neck: The neck is supple, no carotid bruits are noted.  Respiratory: The respiratory examination is clear.  Cardiovascular: The cardiovascular examination reveals a regular rate and rhythm, no obvious murmurs or rubs are  noted.  Neuromuscular: The patient lacks about 15 of full lateral rotation of the cervical spine bilaterally.  Skin: Extremities are without significant edema.  Neurologic Exam  Mental status: The patient is alert and oriented x 3 at the time of the examination. The patient has apparent normal recent and remote memory, with an apparently normal attention span and concentration ability.  Cranial nerves: Facial symmetry is present. There is good sensation of the face to pinprick and soft touch bilaterally. The strength of the facial muscles and the muscles to head turning and shoulder shrug are normal bilaterally. Speech is well enunciated, no aphasia or dysarthria is noted. Extraocular movements are full. Visual fields are full. The tongue is midline, and the patient has symmetric elevation of the soft palate. No obvious hearing deficits are noted.  Motor: The motor testing reveals 5 over 5 strength of all 4 extremities. Good symmetric motor tone is noted throughout.  Sensory: Sensory testing is intact to pinprick, soft touch, vibration sensation, and position sense on all 4 extremities. No evidence of extinction is noted.  Coordination: Cerebellar testing reveals good  finger-nose-finger and heel-to-shin bilaterally.  Gait and station: Gait is normal. Tandem gait is normal. Romberg is negative. No drift is seen.  Reflexes: Deep tendon reflexes are symmetric and normal bilaterally. Toes are downgoing bilaterally.   MRI brain 05/10/15:  IMPRESSION: Exam is motion degraded.  Tiny acute nonhemorrhagic posterior right temporal lobe infarct.  Remote left frontal lobe infarcts with wallerian degeneration unchanged.  Small vessel disease type changes most notable periventricular region.  Mild atrophy without hydrocephalus.  Paranasal sinus mucosal thickening most notable left maxillary Sinus.  * MRI scan images were reviewed online. I agree with the written report.    Assessment/Plan:  1. Left-sided headaches, probable migraine  The patient has a 50 year history of headaches that began in her 68s, associated with left-sided headache pain, improved with sleep. The patient likely has migraine headache, the headaches are relatively infrequent occurring once a month or possibly twice a month. I would not start a daily medication at this time. The patient was given a small prescription for Fioricet to take if needed, she will follow-up in one year or sooner if needed. The headache syndrome is likely benign, she has undergone MRI of the brain and cervical spine previously. The patient has undergone a carotid Doppler study within the last several months that was unremarkable.   Jill Alexanders MD 07/02/2017 12:03 PM  [facility] [address] [place],  [address]  Phone [phone] Fax [phone]

## 2017-07-11 ENCOUNTER — Telehealth: Payer: Self-pay | Admitting: Internal Medicine

## 2017-07-11 NOTE — Telephone Encounter (Signed)
Pt c/o BP issue: STAT if pt c/o blurred vision, one-sided weakness or slurred speech  1. What are your last 5 BP readings?     10/30  6:30p 147/107   87 hr     today  8a  148/101 86 hr   9:30a    117/85   102 hr    11a 131/86   89hr   1:30p 153/105   78 hr  2. Are you having any other symptoms (ex. Dizziness, headache, blurred vision, passed out)?  NECK pain  3. What is your BP issue? HIGH

## 2017-07-11 NOTE — Telephone Encounter (Signed)
Spoke with patient - she currently is taking felodipine 2.5 mg daily, labetolol 200 mg bid, Diovan 80 mg bid and metoprolol tart 25 mg prn elevated HR.  Patient states she took one dose of metoprolol this am.    Advised patient to increase felodipine to 5 mg daily and continue all other medications.  Patient stated that is what she had thought to do, but was worried that it wouldn't work.  I suggested that we wouldn't know until we tried, and that the dose was low enough that she shouldn't expect any problems.  She was upset that she was not speaking with Dr. Caryl Comes, didn't know why a pharmacist would be "playing" doctor (she did not seem angry, more confused).  Stated that she really wanted to go back to Dr. Hartford Poli at [place], but cannot get transportation to that office.    I explained that I am certified in hypertension management and she finally agreed to increase the felodipine.  She was advised to call back if her BP remains elevated.

## 2017-07-11 NOTE — Telephone Encounter (Signed)
Returned call to patient.She stated her B/P has been elevated for some time.Appox 4 weeks or more.B/P readings as listed below.Stated she has not missed taking her medications.Advised I will send message to our pharmacist for advice.

## 2017-07-13 ENCOUNTER — Telehealth: Payer: Self-pay | Admitting: *Deleted

## 2017-07-13 ENCOUNTER — Other Ambulatory Visit: Payer: Self-pay

## 2017-07-13 MED ORDER — LABETALOL HCL 200 MG PO TABS: 200.0000 mg | ORAL_TABLET | Freq: Two times a day (BID) | ORAL | 1 refills | Status: DC

## 2017-07-13 NOTE — Telephone Encounter (Signed)
I refilled the labetolol for 200 mg bid, but can you please call her Monday and have her schedule an appointment to be seen by hypertension clinic pharmD.  I am only giving her a 2 month supply until someone sees her and figures out what she is really taking

## 2017-07-13 NOTE — Telephone Encounter (Signed)
Please also see phone note from cardiology dated 06/21/17 where patient refused to stop taking the Diovan.

## 2017-07-13 NOTE — Telephone Encounter (Signed)
I called pt back to let her know that we would refill her labetalol 200 mg since pt has been seen by Dr. Caryl Comes in June of this year. I confirmed her pharmacy and the strength of medication which is 1 tab in am and 1 tab in pm in pt chart. She said that she does take labetalol like that but her bottle says to take 1 tab in am and 1.5 tab in pm but it was her mistake that she told the doctor she was taking it 1 tab BID. Pt also stated that is how she use to take med but was wondering if she needed to start back added the 1/2 tab in pm because her BP was running higher at night. I informed her that she would need to talk to the doctor about the change in Rx and her BP being high. I asked her is she had spoke to pharmacist two days ago about her calling if her BP was high  Per note on 07/11/17 and she doesn't remembering having that conversation. Then proceeds to tell me that she thinks Dr. Hartford Poli told her to take the medication differently. Pt seemed very confused. How should I refill? Please advise and review.

## 2017-07-13 NOTE — Telephone Encounter (Signed)
Pt wants to know if her labetalol 200 mg can be refilled by Dr. Caryl Comes but was refilled by another provider. Pt states she has not been seen by the other provider in 2 years. She says she can't get to other provider's office to be seen because it is in [facility]. Pt says she can be reached on her home phone number. Please advise and review.

## 2017-07-13 NOTE — Telephone Encounter (Signed)
Pt has called twice with concerns about her BP meds, pt states someone ( a pharmacist) told her that Diovan causes cancer and she can't understand why it not being put out to the public? Pt asking me to send in refill for labetalol that Dr.Levi filled for her at [facility] [place] because he is a hypertension specialist. Pt has not seen that Dr. Since 2017 due to not having transportation. Pt is very confused on what she is taking and is questioning it medications are correct. Please advise  See phone not from Dr. Caryl Comes 07/11/2017

## 2017-07-13 NOTE — Telephone Encounter (Signed)
OK to refill - pt saw Dr Caryl Comes in June of this year.

## 2017-07-16 NOTE — Telephone Encounter (Signed)
Patient scheduled appointment to discuss medications at this office on 07/17/17

## 2017-07-16 NOTE — Telephone Encounter (Signed)
Sounds like she needs an OV for clarification on her blood pressure medication, can be seen here or she can make an appt with her cardiologist

## 2017-07-17 ENCOUNTER — Encounter: Payer: Self-pay | Admitting: Nurse Practitioner

## 2017-07-17 ENCOUNTER — Ambulatory Visit (INDEPENDENT_AMBULATORY_CARE_PROVIDER_SITE_OTHER): Payer: Medicare Other | Admitting: Nurse Practitioner

## 2017-07-17 VITALS — BP 146/98 | HR 82 | Temp 98.2°F | Resp 17 | Ht 65.0 in | Wt 184.4 lb

## 2017-07-17 DIAGNOSIS — I10 Essential (primary) hypertension: Secondary | ICD-10-CM

## 2017-07-17 MED ORDER — LOSARTAN POTASSIUM 100 MG PO TABS: 100.0000 mg | ORAL_TABLET | Freq: Every day | ORAL | 3 refills | Status: DC

## 2017-07-17 NOTE — Progress Notes (Signed)
Careteam: Patient Care Team: Lauree Chandler, NP as PCP - General (Geriatric Medicine) Deboraha Sprang, MD as Consulting Physician (Cardiology) Shon Hough, MD as Consulting Physician (Ophthalmology) Sanjuana Mae, MD as Referring Physician (Vascular Surgery) Sharmon Revere as Physician Assistant (Cardiology)  Advanced Directive information Does Patient Have a Medical Advance Directive?: Yes, Type of Advance Directive: [place];Living will, Does patient want to make changes to medical advance directive?: No - Patient declined  Allergies  Allergen Reactions  . Hydrochlorothiazide Other (See Comments) and Anaphylaxis    Unknown allergic reaction  . Penicillins Hives and Anaphylaxis    Has patient had a PCN reaction causing immediate rash, facial/tongue/throat swelling, SOB or lightheadedness with hypotension: Yes Has patient had a PCN reaction causing severe rash involving mucus membranes or skin necrosis: No Has patient had a PCN reaction that required hospitalization No Has patient had a PCN reaction occurring within the last 10 years: No If all of the above answers are "NO", then may proceed with Cephalosporin use.   . Ciprofloxacin Other (See Comments)    Blood pressure issues  . Hydrocodone-Acetaminophen Other (See Comments)    unknown  . Iodine-131 Other (See Comments)    Raises blood pressure  . Latex Itching  . Statins     Muscle weakness  . Carbamazepine Other (See Comments) and Rash    Flushed blood pressure medication out of system  . Codeine Nausea And Vomiting and Nausea Only    Nausea and vomiting  . Sulfamethoxazole Rash  . Sulfonamide Derivatives Hives    Hives     Chief Complaint  Patient presents with  . Acute Visit    Pt is being seen to discuss BP medication changes. Pt states that her medications are not controlling her  BP. Pt provided a list of home readings.      HPI: Patient is a 83 y.o. female seen  in the office today due to blood pressure.   Pt takes her blood pressure multiple times a day. Reports the bottom number is constantly over 100.  Home reading 132-166/90-104 States she knows her blood pressure is high (no symptoms) just can tell.  Reports she follows low sodium diet.  Takes labetalol 200 mg by mouth twice daily, instruction on bottle says 1 tablet in the am and 1.5 in the PM but she takes 1 tablet twice daily, diovan 160 mg 1/2 tablet- 80 mg twice daily. Reports that is is aware valsartan was recalled so she was started on diovan- spending 100$ a month but okay with spending this monthly.  Has tried cozaar and quite a few other medications in the past that did not work- she remembers witting "no" beside the drug but does not remember why she stopped taking it. Tolerating diovan without side effects.   Review of Systems:  Review of Systems  Constitutional: Negative for chills, fever and malaise/fatigue.  Respiratory: Negative for cough and shortness of breath.   Cardiovascular: Positive for palpitations. Negative for chest pain, orthopnea, claudication and leg swelling.  Gastrointestinal: Negative for abdominal pain.  Genitourinary: Negative for dysuria.  Musculoskeletal: Negative for falls, joint pain and myalgias.  Skin: Negative for itching and rash.  Neurological: Negative for dizziness, loss of consciousness, weakness and headaches.  Psychiatric/Behavioral: Positive for memory loss. Negative for depression. The patient is nervous/anxious and has insomnia.     Past Medical History:  Diagnosis Date  . Anxiety   . Arthritis   .  Bulging of cervical intervertebral disc   . Cardiomegaly   . Cataracts, bilateral   . Chronic neck pain   . History of endometrial cancer   . History of stroke    2015 and 2016 // carotid US 8/16: Bilateral 1-39 // carotid US 2/16: Bilateral ICA 1-39  . HLD (hyperlipidemia)   . Hx of echocardiogram    a. Echo (11/15): Mild LVH, EF 55-60%,  normal wall motion, grade 2 diastolic dysfunction, mild LAE, normal RV function, PASP 29 mm Hg // echo 8/16:Mild concentric LVH, EF 55-60, normal wall motion, grade 2 diastolic dysfunction, mild LAE, PASP 39  . Hypertension   . Insomnia   . Lung nodule   . OA (osteoarthritis)   . Pectus excavatum   . Persistent atrial fibrillation ([place])   . Vertigo    Past Surgical History:  Procedure Laterality Date  . ABDOMINAL HYSTERECTOMY  2011  . CATARACT EXTRACTION, BILATERAL Bilateral 2009  . CESAREAN SECTION  1975  . LAPAROSCOPIC HYSTERECTOMY  2011   Social History:   reports that  has never smoked. she has never used smokeless tobacco. She reports that she does not drink alcohol or use drugs.  Family History  Problem Relation Age of Onset  . Dementia Mother   . Heart attack Father   . Heart disease Father   . Stroke Maternal Uncle   . Hypertension Neg Hx     Medications:   Medication List        Accurate as of 07/17/17  2:28 PM. Always use your most recent med list.          acetaminophen 325 MG tablet Commonly known as:  TYLENOL   BENGAY EX   cholecalciferol 1000 units tablet Commonly known as:  VITAMIN D   felodipine 5 MG 24 hr tablet Commonly known as:  PLENDIL   FLUoxetine 10 MG tablet Commonly known as:  PROZAC Take one tablet by mouth once daily after lunch   hydrocortisone 2.5 % rectal cream Commonly known as:  ANUSOL-HC   KLOR-CON M20 20 MEQ tablet Generic drug:  potassium chloride SA TAKE 1/2 TABLET ONCE A DAY   labetalol 200 MG tablet Commonly known as:  NORMODYNE Take 1 tablet (200 mg total) by mouth 2 (two) times daily. Take 1 tablet (200 mg) by mouth every morning and 1 tablet (200 mg) after supper.   meclizine 25 MG tablet Commonly known as:  ANTIVERT Take 1 tablet (25 mg total) by mouth daily as needed for dizziness.   metoprolol tartrate 50 MG tablet Commonly known as:  LOPRESSOR   valsartan 160 MG tablet Commonly known as:  DIOVAN Take  0.5 tablets (80 mg total) by mouth 2 (two) times daily. Please keep 05/25/17 appt for further refills   XARELTO 20 MG Tabs tablet Generic drug:  rivaroxaban TAKE 1 TABLET DAILY   zolpidem 10 MG tablet Commonly known as:  AMBIEN        Physical Exam:  Vitals:   07/17/17 1416  BP: (!) 146/98  Pulse: 82  Resp: 17  Temp: 98.2 F (36.8 C)  TempSrc: Oral  SpO2: 95%  Weight: 184 lb 6.4 oz (83.6 kg)  Height: 5\' 5"  (1.651 m)   Body mass index is 30.69 kg/m.  Physical Exam  Constitutional: She is oriented to person, place, and time. She appears well-developed and well-nourished. No distress.  HENT:  Head: Normocephalic and atraumatic.  Mouth/Throat: Oropharynx is clear and moist. No oropharyngeal exudate.  Eyes: Conjunctivae  are normal. Pupils are equal, round, and reactive to light.  Neck: Normal range of motion. Neck supple.  Cardiovascular: Normal rate and normal heart sounds. An irregular rhythm present.  Pulmonary/Chest: Effort normal and breath sounds normal.  Abdominal: Soft. Bowel sounds are normal.  Musculoskeletal: She exhibits no edema or tenderness.  Neurological: She is alert and oriented to person, place, and time.  Skin: Skin is warm and dry. She is not diaphoretic.  Psychiatric: She has a normal mood and affect.   Labs reviewed: Basic Metabolic Panel: Recent Labs    12/15/16 0829 02/21/17 1123 06/20/17 1033  NA 140 139 142  K 4.2 4.6 4.0  CL 105 100 102  CO2 28 25 27   GLUCOSE 114* 98 95  BUN 12 15 12   CREATININE 0.81 0.91 1.03*  CALCIUM 9.0 9.2 9.4  TSH  --   --  2.710   Liver Function Tests: Recent Labs    12/15/16 0829  AST 17  ALT 10  ALKPHOS 62  BILITOT 0.6  PROT 6.4  ALBUMIN 3.9   No results for input(s): LIPASE, AMYLASE in the last 8760 hours. No results for input(s): AMMONIA in the last 8760 hours. CBC: Recent Labs    12/19/16 1530 06/20/17 1033  WBC 7.2 7.1  NEUTROABS 4,248  --   HGB 13.5 13.5  HCT 40.7 39.7  MCV 91.7 91   PLT 223 199   Lipid Panel: Recent Labs    12/15/16 0829  CHOL 199  HDL 28*  LDLCALC 135*  TRIG 178*  CHOLHDL 7.1*   TSH: Recent Labs    06/20/17 1033  TSH 2.710   A1C: Lab Results  Component Value Date   HGBA1C 5.5 12/19/2016   MMSE - Mini Mental State Exam 12/19/2016  Orientation to time 5  Orientation to Place 5  Registration 3  Attention/ Calculation 5  Recall 2  Language- name 2 objects 2  Language- repeat 1  Language- follow 3 step command 3  Language- read & follow direction 1  Write a sentence 1  Copy design 1  Total score 29      Assessment/Plan 1. Essential hypertension -will stop diovan and start losartan, does not remember why she stopped cozaar but willing to take losartan. Very hesitant but agreeable.  To call if any questions.  - losartan (COZAAR) 100 MG tablet; Take 1 tablet (100 mg total) daily by mouth.  Dispense: 90 tablet; Refill: 3 -dash diet given  Total time 30 mins: time greater than 50% of total time spent doing pt counseling, answering questions regarding medical history and medication and coordination of care regarding hypertension.   Next appt: 4 weeks Geoge Lawrance K. Harle Battiest  [facility] [phone] 8 am - 5 pm) [phone] (after hours)

## 2017-07-17 NOTE — Patient Instructions (Addendum)
STOP diovan  Start Losartan   Take blood pressure 1 hour after you have taken your blood pressure medication once you have been sitting down for at least 5 mins.     DASH Eating Plan DASH stands for "Dietary Approaches to Stop Hypertension." The DASH eating plan is a healthy eating plan that has been shown to reduce high blood pressure (hypertension). It may also reduce your risk for type 2 diabetes, heart disease, and stroke. The DASH eating plan may also help with weight loss. What are tips for following this plan? General guidelines  Avoid eating more than 2,300 mg (milligrams) of salt (sodium) a day. If you have hypertension, you may need to reduce your sodium intake to 1,500 mg a day.  Limit alcohol intake to no more than 1 drink a day for nonpregnant women and 2 drinks a day for men. One drink equals 12 oz of beer, 5 oz of wine, or 1 oz of hard liquor.  Work with your health care provider to maintain a healthy body weight or to lose weight. Ask what an ideal weight is for you.  Get at least 30 minutes of exercise that causes your heart to beat faster (aerobic exercise) most days of the week. Activities may include walking, swimming, or biking.  Work with your health care provider or diet and nutrition specialist (dietitian) to adjust your eating plan to your individual calorie needs. Reading food labels  Check food labels for the amount of sodium per serving. Choose foods with less than 5 percent of the Daily Value of sodium. Generally, foods with less than 300 mg of sodium per serving fit into this eating plan.  To find whole grains, look for the word "whole" as the first word in the ingredient list. Shopping  Buy products labeled as "low-sodium" or "no salt added."  Buy fresh foods. Avoid canned foods and premade or frozen meals. Cooking  Avoid adding salt when cooking. Use salt-free seasonings or herbs instead of table salt or sea salt. Check with your health care provider  or pharmacist before using salt substitutes.  Do not fry foods. Cook foods using healthy methods such as baking, boiling, grilling, and broiling instead.  Cook with heart-healthy oils, such as olive, canola, soybean, or sunflower oil. Meal planning   Eat a balanced diet that includes: ? 5 or more servings of fruits and vegetables each day. At each meal, try to fill half of your plate with fruits and vegetables. ? Up to 6-8 servings of whole grains each day. ? Less than 6 oz of lean meat, poultry, or fish each day. A 3-oz serving of meat is about the same size as a deck of cards. One egg equals 1 oz. ? 2 servings of low-fat dairy each day. ? A serving of nuts, seeds, or beans 5 times each week. ? Heart-healthy fats. Healthy fats called Omega-3 fatty acids are found in foods such as flaxseeds and coldwater fish, like sardines, salmon, and mackerel.  Limit how much you eat of the following: ? Canned or prepackaged foods. ? Food that is high in trans fat, such as fried foods. ? Food that is high in saturated fat, such as fatty meat. ? Sweets, desserts, sugary drinks, and other foods with added sugar. ? Full-fat dairy products.  Do not salt foods before eating.  Try to eat at least 2 vegetarian meals each week.  Eat more home-cooked food and less restaurant, buffet, and fast food.  When eating at  a restaurant, ask that your food be prepared with less salt or no salt, if possible. What foods are recommended? The items listed may not be a complete list. Talk with your dietitian about what dietary choices are best for you. Grains Whole-grain or whole-wheat bread. Whole-grain or whole-wheat pasta. Brown rice. Modena Morrow. Bulgur. Whole-grain and low-sodium cereals. Pita bread. Low-fat, low-sodium crackers. Whole-wheat flour tortillas. Vegetables Fresh or frozen vegetables (raw, steamed, roasted, or grilled). Low-sodium or reduced-sodium tomato and vegetable juice. Low-sodium or  reduced-sodium tomato sauce and tomato paste. Low-sodium or reduced-sodium canned vegetables. Fruits All fresh, dried, or frozen fruit. Canned fruit in natural juice (without added sugar). Meat and other protein foods Skinless chicken or [place]. Ground chicken or [place]. Pork with fat trimmed off. Fish and seafood. Egg whites. Dried beans, peas, or lentils. Unsalted nuts, nut butters, and seeds. Unsalted canned beans. Lean cuts of beef with fat trimmed off. Low-sodium, lean deli meat. Dairy Low-fat (1%) or fat-free (skim) milk. Fat-free, low-fat, or reduced-fat cheeses. Nonfat, low-sodium ricotta or cottage cheese. Low-fat or nonfat yogurt. Low-fat, low-sodium cheese. Fats and oils Soft margarine without trans fats. Vegetable oil. Low-fat, reduced-fat, or light mayonnaise and salad dressings (reduced-sodium). Canola, safflower, olive, soybean, and sunflower oils. Avocado. Seasoning and other foods Herbs. Spices. Seasoning mixes without salt. Unsalted popcorn and pretzels. Fat-free sweets. What foods are not recommended? The items listed may not be a complete list. Talk with your dietitian about what dietary choices are best for you. Grains Baked goods made with fat, such as croissants, muffins, or some breads. Dry pasta or rice meal packs. Vegetables Creamed or fried vegetables. Vegetables in a cheese sauce. Regular canned vegetables (not low-sodium or reduced-sodium). Regular canned tomato sauce and paste (not low-sodium or reduced-sodium). Regular tomato and vegetable juice (not low-sodium or reduced-sodium). Angie Fava. Olives. Fruits Canned fruit in a light or heavy syrup. Fried fruit. Fruit in cream or butter sauce. Meat and other protein foods Fatty cuts of meat. Ribs. Fried meat. Berniece Salines. Sausage. Bologna and other processed lunch meats. Salami. Fatback. Hotdogs. Bratwurst. Salted nuts and seeds. Canned beans with added salt. Canned or smoked fish. Whole eggs or egg yolks. Chicken or [place]  with skin. Dairy Whole or 2% milk, cream, and half-and-half. Whole or full-fat cream cheese. Whole-fat or sweetened yogurt. Full-fat cheese. Nondairy creamers. Whipped toppings. Processed cheese and cheese spreads. Fats and oils Butter. Stick margarine. Lard. Shortening. Ghee. Bacon fat. Tropical oils, such as coconut, palm kernel, or palm oil. Seasoning and other foods Salted popcorn and pretzels. Onion salt, garlic salt, seasoned salt, table salt, and sea salt. Worcestershire sauce. Tartar sauce. Barbecue sauce. Teriyaki sauce. Soy sauce, including reduced-sodium. Steak sauce. Canned and packaged gravies. Fish sauce. Oyster sauce. Cocktail sauce. Horseradish that you find on the shelf. Ketchup. Mustard. Meat flavorings and tenderizers. Bouillon cubes. Hot sauce and Tabasco sauce. Premade or packaged marinades. Premade or packaged taco seasonings. Relishes. Regular salad dressings. Where to find more information:  National Heart, Lung, and [place]: https://wilson-eaton.com/  American Heart Association: www.heart.org Summary  The DASH eating plan is a healthy eating plan that has been shown to reduce high blood pressure (hypertension). It may also reduce your risk for type 2 diabetes, heart disease, and stroke.  With the DASH eating plan, you should limit salt (sodium) intake to 2,300 mg a day. If you have hypertension, you may need to reduce your sodium intake to 1,500 mg a day.  When on the DASH eating plan, aim to eat  more fresh fruits and vegetables, whole grains, lean proteins, low-fat dairy, and heart-healthy fats.  Work with your health care provider or diet and nutrition specialist (dietitian) to adjust your eating plan to your individual calorie needs. This information is not intended to replace advice given to you by your health care provider. Make sure you discuss any questions you have with your health care provider. Document Released: 08/17/2011 Document Revised: 08/21/2016  Document Reviewed: 08/21/2016 Elsevier Interactive Patient Education  2017 [employer].

## 2017-07-18 ENCOUNTER — Other Ambulatory Visit: Payer: Self-pay | Admitting: Nurse Practitioner

## 2017-07-18 ENCOUNTER — Telehealth: Payer: Self-pay | Admitting: *Deleted

## 2017-07-18 MED ORDER — VALSARTAN 160 MG PO TABS: 160.0000 mg | ORAL_TABLET | Freq: Two times a day (BID) | ORAL | 0 refills | Status: DC

## 2017-07-18 NOTE — Telephone Encounter (Signed)
Patient notified and agreed.  

## 2017-07-18 NOTE — Telephone Encounter (Signed)
Patient called and stated that she saw Jessica yesterday and was placed on new BP medication. Patient stated that she DOES NOT want to take the new medication. She stated that she wants to continue with the Diovan 1/2 tablet twice daily and the Labetalol 1 in the am and 1 1/2 in the pm. Patient stated that she is too old to be started on something new when she knows this works. Stated that she wants refill on these sent to her pharmacy. Please Advise.

## 2017-07-18 NOTE — Telephone Encounter (Signed)
Okay but will need to go up on the diovan to a higher dose because her blood pressure is not controlled. Will send in new Rx to pharmacy

## 2017-08-05 ENCOUNTER — Other Ambulatory Visit: Payer: Self-pay | Admitting: Nurse Practitioner

## 2017-08-31 ENCOUNTER — Other Ambulatory Visit: Payer: Self-pay | Admitting: Nurse Practitioner

## 2017-09-05 NOTE — Telephone Encounter (Signed)
A refill request was received for zolpidem 10 mg tablet. Rx was called in to pharmacy after verifying last fill date, provider, and quantity on PMP [place].

## 2017-09-18 ENCOUNTER — Other Ambulatory Visit: Payer: Self-pay | Admitting: Internal Medicine

## 2017-09-25 ENCOUNTER — Other Ambulatory Visit: Payer: Self-pay | Admitting: *Deleted

## 2017-09-25 DIAGNOSIS — F419 Anxiety disorder, unspecified: Secondary | ICD-10-CM

## 2017-09-25 MED ORDER — FLUOXETINE HCL 10 MG PO TABS: ORAL_TABLET | ORAL | 2 refills | Status: DC

## 2017-09-25 NOTE — Telephone Encounter (Signed)
Patient requested 

## 2017-10-03 ENCOUNTER — Other Ambulatory Visit: Payer: Self-pay | Admitting: Nurse Practitioner

## 2017-10-04 ENCOUNTER — Other Ambulatory Visit: Payer: Self-pay | Admitting: Nurse Practitioner

## 2017-10-05 ENCOUNTER — Other Ambulatory Visit: Payer: Self-pay | Admitting: *Deleted

## 2017-10-05 MED ORDER — ZOLPIDEM TARTRATE 10 MG PO TABS: ORAL_TABLET | ORAL | 0 refills | Status: DC

## 2017-10-05 NOTE — Telephone Encounter (Signed)
Patient requested. Phoned to pharmacy.  

## 2017-10-09 ENCOUNTER — Telehealth: Payer: Self-pay | Admitting: Internal Medicine

## 2017-10-09 NOTE — Telephone Encounter (Addendum)
**Note De-Identified  Obfuscation** The pt wants me to do a tier exception on her name brand Diovan as she states that she cannot afford it.  I checked her med list and noticed that the last provider that refilled this medication was Sherrie Mustache, NP on 07/18/17 for #90 of generic Valsartan with no refills. I also noticed that another MD named Ghimire Shanker refilled Valsartan on 05/14/2015 for #60 with no refills and that Dr Bonnielee Haff discontinued it on 03/22/16. According to the pts chart we have never refilled her Diovan/Valsartan.  I called the pts pharmacy and was advised that the pt refuses to take any generic medications and they confirmed that we have never authorized a refill on this medication for the pt in the past.  I called the pt back and asked her why she cannot take Valsartan as it will cost her less money. She stated "I cannot take that, it is causing people cancer and I cant take it".  I advised her that not all Valsartan has been on recall.  I attempted to explain to her that I cannot get a tier exception for Diovan because we have no reason why she cant take Valsartan. She then stated "Well I took 1 valsartan pill a long time ago and it made me sick so I dont take it anymore and I have to have Diovan". She states that she was seeing another MD when this happened and that she did not report it to them.  I read her the directions that is listed in her med list  for her Valsartan as take one 160 mg tablet twice daily. She stated "that's not right, I take 1/2 tablet twice daily to make my pills last longer".  I then recommended that she contact the MD that she states she sees in [facility] for her Hypertension as he is the MD that prescribed "Diovan" to her.  She stated "Ive called and left 2 messages asking him to call me back".  I was on the phone with her for more than 25 mins. When I told her that I cannot get a tier exception on her Diovan as I am unsure of her dose or why she has to have Diovan she  then hung up.  Will forward to Dr Caryl Comes as Juluis Rainier.

## 2017-10-09 NOTE — Telephone Encounter (Signed)
Tiffany Black is calling to speak with a nurse  about her medication (Diovan 160) she is needing it to be called in as a tier 3 instead of a Tier 4 due to there is a deductible she will have to pay and she is unable to pay that .  Please call [facility] at [phone]  Thanks

## 2017-10-30 ENCOUNTER — Telehealth: Payer: Self-pay | Admitting: Nurse Practitioner

## 2017-10-30 NOTE — Telephone Encounter (Signed)
Attempted to CB patient to reschedule appointments that was canceled for 11/14/17

## 2017-11-04 ENCOUNTER — Other Ambulatory Visit: Payer: Self-pay | Admitting: Nurse Practitioner

## 2017-11-05 ENCOUNTER — Other Ambulatory Visit: Payer: Self-pay | Admitting: Nurse Practitioner

## 2017-11-05 DIAGNOSIS — F419 Anxiety disorder, unspecified: Secondary | ICD-10-CM

## 2017-11-05 NOTE — Telephone Encounter (Signed)
Patient has not been seen since 07/2017 and has cancelled all upcoming appointments. Refill request was pended to provider.

## 2017-11-11 ENCOUNTER — Other Ambulatory Visit: Payer: Self-pay | Admitting: Nurse Practitioner

## 2017-11-11 DIAGNOSIS — F419 Anxiety disorder, unspecified: Secondary | ICD-10-CM

## 2017-11-12 ENCOUNTER — Encounter: Payer: Self-pay | Admitting: Nurse Practitioner

## 2017-11-12 ENCOUNTER — Ambulatory Visit: Payer: Medicare Other | Admitting: Nurse Practitioner

## 2017-11-12 VITALS — BP 138/84 | HR 78 | Temp 97.9°F | Resp 17 | Ht 65.0 in | Wt 183.0 lb

## 2017-11-12 DIAGNOSIS — G4700 Insomnia, unspecified: Secondary | ICD-10-CM

## 2017-11-12 DIAGNOSIS — I10 Essential (primary) hypertension: Secondary | ICD-10-CM | POA: Diagnosis not present

## 2017-11-12 DIAGNOSIS — R739 Hyperglycemia, unspecified: Secondary | ICD-10-CM | POA: Diagnosis not present

## 2017-11-12 DIAGNOSIS — E2839 Other primary ovarian failure: Secondary | ICD-10-CM | POA: Diagnosis not present

## 2017-11-12 DIAGNOSIS — I481 Persistent atrial fibrillation: Secondary | ICD-10-CM | POA: Diagnosis not present

## 2017-11-12 DIAGNOSIS — E782 Mixed hyperlipidemia: Secondary | ICD-10-CM | POA: Diagnosis not present

## 2017-11-12 DIAGNOSIS — I4819 Other persistent atrial fibrillation: Secondary | ICD-10-CM

## 2017-11-12 DIAGNOSIS — F419 Anxiety disorder, unspecified: Secondary | ICD-10-CM | POA: Diagnosis not present

## 2017-11-12 MED ORDER — VALSARTAN 160 MG PO TABS: 80.0000 mg | ORAL_TABLET | Freq: Two times a day (BID) | ORAL | Status: DC

## 2017-11-12 MED ORDER — LABETALOL HCL 200 MG PO TABS: ORAL_TABLET | ORAL | 9 refills | Status: DC

## 2017-11-12 MED ORDER — ZOLPIDEM TARTRATE 5 MG PO TABS: 5.0000 mg | ORAL_TABLET | Freq: Every day | ORAL | Status: DC

## 2017-11-12 NOTE — Patient Instructions (Addendum)
To reduce Ambien to 5 mg at bedtime  To start melatonin 3 mg by mouth at bedtime every night to help sleep  To make appt for fasting lab work this week.
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
## 2017-11-13 ENCOUNTER — Telehealth: Payer: Self-pay | Admitting: *Deleted

## 2017-11-13 ENCOUNTER — Other Ambulatory Visit: Payer: Medicare Other

## 2017-11-13 NOTE — Telephone Encounter (Signed)
Patient called and stated that she tried cutting the Ambien in half last night but she woke up at 1am and could not go back to sleep. She did not try the Melatonin because she does not want to mix medications, she is not comfortable doing that. Patient stated that she has been taking Ambien for 40 years and 5-10mg  at bedtime works for her and would like that back in her chart. Please Advise.

## 2017-11-13 NOTE — Telephone Encounter (Signed)
These are my recommendations. We will not be able to prescribe more than 5 mg of Ambien daily, if she wants to try a different medication all together we could attempt that but she has been hesitant to try anything new.

## 2017-11-13 NOTE — Telephone Encounter (Signed)
Patient notified

## 2017-11-13 NOTE — Telephone Encounter (Signed)
We will not be able to go up, she needs to continue to on Ambien 5 mg at bedtime and add melatonin 3 mg daily- melatonin is safe to add

## 2017-11-13 NOTE — Telephone Encounter (Signed)
Patient stated she does not not agree with this. Stated that she has been taking this medication before you were born.  Stated that this works for her. Stated that she knows you have your reason. Patient stated that she does not think it is a good idea to mix these medication and doesn't feel comfortable doing it. Please Advise.

## 2017-11-13 NOTE — Telephone Encounter (Signed)
#  busy will try again later.

## 2017-11-14 ENCOUNTER — Ambulatory Visit: Payer: Medicare Other | Admitting: Nurse Practitioner

## 2017-11-14 ENCOUNTER — Other Ambulatory Visit: Payer: Self-pay | Admitting: *Deleted

## 2017-11-14 ENCOUNTER — Other Ambulatory Visit: Payer: Self-pay | Admitting: Nurse Practitioner

## 2017-11-14 ENCOUNTER — Ambulatory Visit: Payer: Medicare Other

## 2017-11-14 DIAGNOSIS — F419 Anxiety disorder, unspecified: Secondary | ICD-10-CM

## 2017-11-14 MED ORDER — FLUOXETINE HCL 10 MG PO TABS: ORAL_TABLET | ORAL | 1 refills | Status: DC

## 2017-11-14 NOTE — Telephone Encounter (Signed)
Patient called to request that her medication be resent to pharmacy because they did not received it.

## 2017-11-14 NOTE — Telephone Encounter (Signed)
Patient requested Rx to be sent to pharmacy. Pharmacy did not receive last faxed Rx.

## 2017-11-19 ENCOUNTER — Telehealth: Payer: Self-pay | Admitting: *Deleted

## 2017-11-19 NOTE — Telephone Encounter (Signed)
Patient stated that she has Made appointments with Dr. Caryl Comes and Dr. Hartford Poli to discuss. Stated that she is not going to just let this go. Stated that she has taken it for 40 years and she feels better when she takes. It.

## 2017-11-19 NOTE — Telephone Encounter (Signed)
Patient called requesting to speak with Dr. Mariea Clonts. Stated that she cannot take the Melatonin because it causes weakness, Shaking and she Cannot sleep. She does not want to take this medication. Has been taking the Ambien for 40 + years.

## 2017-11-19 NOTE — Telephone Encounter (Signed)
Ambien is on the [place] list which is a list of medications not recommended for older adults over 90 because it can affect balance and memory.  I agree with NP Dewaine Oats that she should not take this.  Also, pharmacies and insurance companies are beginning to avoid paying for these meds.  If this explanation is not acceptable, she can come in for an appt with me to discuss it.

## 2017-11-19 NOTE — Telephone Encounter (Signed)
Patient wants to speak with Dr. Mariea Clonts directly regarding this. Stated that since Dr. Mariea Clonts was the "head" physician. Please Advise.

## 2017-11-28 ENCOUNTER — Ambulatory Visit: Payer: Medicare Other | Admitting: Internal Medicine

## 2017-11-28 ENCOUNTER — Encounter: Payer: Self-pay | Admitting: Internal Medicine

## 2017-11-28 VITALS — BP 154/94 | HR 87 | Ht 65.0 in | Wt 183.0 lb

## 2017-11-28 DIAGNOSIS — Z8673 Personal history of transient ischemic attack (TIA), and cerebral infarction without residual deficits: Secondary | ICD-10-CM | POA: Diagnosis not present

## 2017-11-28 DIAGNOSIS — I482 Chronic atrial fibrillation: Secondary | ICD-10-CM | POA: Diagnosis not present

## 2017-11-28 DIAGNOSIS — I4821 Permanent atrial fibrillation: Secondary | ICD-10-CM

## 2017-11-28 LAB — CBC WITH DIFFERENTIAL/PLATELET
Basophils Absolute: 0 10*3/uL (ref 0.0–0.2)
Basos: 1 %
EOS (ABSOLUTE): 0.2 10*3/uL (ref 0.0–0.4)
Eos: 3 %
Hematocrit: 39.8 % (ref 34.0–46.6)
Hemoglobin: 13.6 g/dL (ref 11.1–15.9)
IMMATURE GRANULOCYTES: 0 %
Immature Grans (Abs): 0 10*3/uL (ref 0.0–0.1)
Lymphocytes Absolute: 1.7 10*3/uL (ref 0.7–3.1)
Lymphs: 26 %
MCH: 30.8 pg (ref 26.6–33.0)
MCHC: 34.2 g/dL (ref 31.5–35.7)
MCV: 90 fL (ref 79–97)
Monocytes Absolute: 0.5 10*3/uL (ref 0.1–0.9)
Monocytes: 7 %
NEUTROS PCT: 63 %
Neutrophils Absolute: 4.1 10*3/uL (ref 1.4–7.0)
PLATELETS: 221 10*3/uL (ref 150–379)
RBC: 4.42 x10E6/uL (ref 3.77–5.28)
RDW: 13.5 % (ref 12.3–15.4)
WBC: 6.5 10*3/uL (ref 3.4–10.8)

## 2017-11-28 LAB — BASIC METABOLIC PANEL
BUN/Creatinine Ratio: 16 (ref 12–28)
BUN: 15 mg/dL (ref 8–27)
CO2: 26 mmol/L (ref 20–29)
Calcium: 9.4 mg/dL (ref 8.7–10.3)
Chloride: 102 mmol/L (ref 96–106)
Creatinine, Ser: 0.93 mg/dL (ref 0.57–1.00)
GFR calc Af Amer: 66 mL/min/{1.73_m2} (ref >59)
GFR calc non Af Amer: 57 mL/min/{1.73_m2} — ABNORMAL LOW (ref >59)
GLUCOSE: 102 mg/dL — AB (ref 65–99)
POTASSIUM: 4.5 mmol/L (ref 3.5–5.2)
Sodium: 141 mmol/L (ref 134–144)

## 2017-11-28 NOTE — Patient Instructions (Addendum)
Medication Instructions:  Your physician recommends that you continue on your current medications as directed. Please refer to the Current Medication list given to you today.  Labwork: You will have labs drawn today:  CBC and BMP   Testing/Procedures: None ordered.  Follow-Up: Your physician recommends that you schedule a follow-up appointment in:   6 months with Tommye Standard, PA    Any Other Special Instructions Will Be Listed Below (If Applicable).  [facility] [phone]    If you need a refill on your cardiac medications before your next appointment, please call your pharmacy.

## 2017-11-28 NOTE — Progress Notes (Signed)
T       Patient Care Team: Lauree Chandler, NP as PCP - General (Geriatric Medicine) Deboraha Sprang, MD as Consulting Physician (Cardiology) Shon Hough, MD as Consulting Physician (Ophthalmology) Sanjuana Mae, MD as Referring Physician (Vascular Surgery) Sharmon Revere as Physician Assistant (Cardiology)   HPI  Tiffany Black is a 84 y.o. female Seen in followup for atrial fibrillation now permanent   TIA  On anticoagulation   No bleeding    Echo 8/16 demonstrated normal LV function and mild LVH and left atrial enlargement Carotid < 40% bilateral stenosis 7/18 Echo normal LV function with LVH   Date Cr K Hgb Mg  4/18  0.81 4.2     10/18  1.03(GFR 48)   4.0 13.5      She has occasional palpitations.  She takes as needed beta-blockers for this.  She has chronic mild shortness of breath.  She denies chest pain or presyncope.  She is concerned about her high blood pressure she been followed at the blood pressure clinic at [facility].  With the death of her husband she has not been able to go for the last couple of years.  She has finally learned that she can use [employer]  Blood pressure records demonstrate 130s in the morning and 160s-70s in the afternoon  Past Medical History:  Diagnosis Date  . Anxiety   . Arthritis   . Bulging of cervical intervertebral disc   . Cardiomegaly   . Cataracts, bilateral   . Chronic neck pain   . History of endometrial cancer   . History of stroke    2015 and 2016 // carotid US 8/16: Bilateral 1-39 // carotid US 2/16: Bilateral ICA 1-39  . HLD (hyperlipidemia)   . Hx of echocardiogram    a. Echo (11/15): Mild LVH, EF 55-60%, normal wall motion, grade 2 diastolic dysfunction, mild LAE, normal RV function, PASP 29 mm Hg // echo 8/16:Mild concentric LVH, EF 55-60, normal wall motion, grade 2 diastolic dysfunction, mild LAE, PASP 39  . Hypertension   . Insomnia   . Lung nodule   . OA (osteoarthritis)   . Pectus  excavatum   . Persistent atrial fibrillation ([place])   . Vertigo     Past Surgical History:  Procedure Laterality Date  . ABDOMINAL HYSTERECTOMY  2011  . CATARACT EXTRACTION, BILATERAL Bilateral 2009  . CESAREAN SECTION  1975  . LAPAROSCOPIC HYSTERECTOMY  2011    Current Outpatient Medications  Medication Sig Dispense Refill  . acetaminophen (TYLENOL) 325 MG tablet Take 650 mg by mouth at bedtime as needed for headache (pain).     . cholecalciferol (VITAMIN D) 1000 UNITS tablet Take 1,000 Units by mouth daily after lunch.     . felodipine (PLENDIL) 5 MG 24 hr tablet 1/2 tablet (2.5 mg) daily    . FLUoxetine (PROZAC) 10 MG tablet TAKE 1 TABLET BY MOUTH ONCE DAILY AFTER LUNCH 90 tablet 1  . hydrocortisone (ANUSOL-HC) 2.5 % rectal cream Place 1 application rectally as needed for hemorrhoids or anal itching.    Marland Kitchen KLOR-CON M20 20 MEQ tablet TAKE 1/2 TABLET BY MOUTH ONCE DAILY 15 tablet 6  . labetalol (NORMODYNE) 200 MG tablet 1 tablet in the am 1.5 tablet in the evening 60 tablet 9  . meclizine (ANTIVERT) 25 MG tablet Take 1 tablet (25 mg total) by mouth daily as needed for dizziness. 30 tablet 0  . Menthol, Topical Analgesic, (BENGAY EX) Apply 1 application  topically at bedtime as needed (neck pain).    . metoprolol tartrate (LOPRESSOR) 50 MG tablet Take 1/2 tablet (25 mg) by mouth twice daily as needed for elevated heart rates    . valsartan (DIOVAN) 160 MG tablet Take 0.5 tablets (80 mg total) by mouth 2 (two) times daily.    Alveda Reasons 20 MG TABS tablet TAKE 1 TABLET DAILY 30 tablet 9  . zolpidem (AMBIEN) 5 MG tablet Take 1 tablet (5 mg total) by mouth at bedtime.     No current facility-administered medications for this visit.     Allergies  Allergen Reactions  . Hydrochlorothiazide Other (See Comments) and Anaphylaxis    Unknown allergic reaction  . Penicillins Hives and Anaphylaxis    Has patient had a PCN reaction causing immediate rash, facial/tongue/throat swelling, SOB or  lightheadedness with hypotension: Yes Has patient had a PCN reaction causing severe rash involving mucus membranes or skin necrosis: No Has patient had a PCN reaction that required hospitalization No Has patient had a PCN reaction occurring within the last 10 years: No If all of the above answers are "NO", then may proceed with Cephalosporin use.   . Ciprofloxacin Other (See Comments)    Blood pressure issues  . Hydrocodone-Acetaminophen Other (See Comments)    unknown  . Iodine-131 Other (See Comments)    Raises blood pressure  . Latex Itching  . Statins     Muscle weakness  . Carbamazepine Other (See Comments) and Rash    Flushed blood pressure medication out of system  . Codeine Nausea And Vomiting and Nausea Only    Nausea and vomiting  . Sulfamethoxazole Rash  . Sulfonamide Derivatives Hives    Hives     Review of Systems negative except from HPI and PMH  Physical Exam BP (!) 154/94   Pulse 87   Ht 5\' 5"  (1.651 m)   Wt 183 lb (83 kg)   SpO2 97%   BMI 30.45 kg/m  Well developed and nourished in no acute distress HENT normal Neck supple with JVP-flat Carotids brisk and full without bruits Clear Irregularly irregular rate and rhythm with controlled ventricular response, no murmurs or gallops Abd-soft with active BS without hepatomegaly No Clubbing cyanosis edema Skin-warm and dry A & Oriented  Grossly normal sensory and motor function   ECG afib @ 83 -/09/34 LAD LVH  Assessment and  Plan  Hypertension  Atrial fibrillation - permanent  TIA  Renal insufficiency grade 3    Her blood pressure remains an issue.  This is a major concern.  I have suggested up titration of labetalol from 200/300--300 twice daily.  She is reluctant to make any changes.  She has a scheduled appointment with her hypertension physician at [facility] in April.  I told her I would be glad to help with whatever was recommended.  She asked if I would be her blood pressure doctor.  I  said that we do not have the facilities here to follow patients as closely as he need to with difficult to manage hypertension but would be glad to work in concert with her primary care physician.  She expressed significant misgivings about her primary care physician.  I suggested go for medical.  No bleeding.  Occasional palpitations with her atrial fibrillation which I reviewed with her with permanent.  She will continue to take metoprolol as needed.  We also had a discussion regarding her sleep medications and her frustrations with her primary care physician regarding  prescription for sleep medications.  I told her I could not help her with that.  More than 50% of 40 min was spent in counseling related to the above

## 2017-12-03 NOTE — Addendum Note (Signed)
Addended by: Campbell Riches on: 12/03/2017 08:09 AM   Modules accepted: Orders

## 2017-12-10 ENCOUNTER — Telehealth: Payer: Self-pay | Admitting: Internal Medicine

## 2017-12-10 NOTE — Telephone Encounter (Signed)
Busy signal.. 4/1 md

## 2017-12-10 NOTE — Telephone Encounter (Signed)
Spoke with patient and explained to her what "normal" lab values meant.. I went through the values and she verbalized understanding.Marland Kitchen

## 2017-12-10 NOTE — Telephone Encounter (Signed)
New message ° ° ° °Patient calling for lab results °

## 2017-12-11 ENCOUNTER — Other Ambulatory Visit: Payer: Self-pay | Admitting: Nurse Practitioner

## 2017-12-11 NOTE — Telephone Encounter (Signed)
Patient is requesting that the 10 mg of the zolpidem be filled instead of the 5 mg. Patient did not have the 5 mg filled last month based on PMP database. Last zolpidem refill was for 10 mg on 11/08/17.   Request has been pended to provider for approval.

## 2017-12-12 ENCOUNTER — Other Ambulatory Visit: Payer: Self-pay | Admitting: Nurse Practitioner

## 2017-12-12 ENCOUNTER — Telehealth: Payer: Self-pay | Admitting: Internal Medicine

## 2017-12-12 ENCOUNTER — Other Ambulatory Visit: Payer: Self-pay

## 2017-12-12 DIAGNOSIS — G4700 Insomnia, unspecified: Secondary | ICD-10-CM

## 2017-12-12 MED ORDER — ZOLPIDEM TARTRATE 5 MG PO TABS: 5.0000 mg | ORAL_TABLET | Freq: Every day | ORAL | 0 refills | Status: DC

## 2017-12-12 NOTE — Telephone Encounter (Signed)
Spoke with patient regarding her xarelto dosage. There was no notes by Dr Caryl Comes indicating he wanted to decrease her dosage. I reviewed her last lab values with her as well. She stated she had a "dizzy spell" the other day. I told her it would be hard for me to advise her on that because dizziness can come from many different factors. I advised her to talk with her PCP if it continues. She verbalized understanding and had no additional questions.

## 2017-12-12 NOTE — Telephone Encounter (Signed)
New Message:    Pt c/o medication issue:  1. Name of Medication: XARELTO 20 MG TABS tablet  2. How are you currently taking this medication (dosage and times per day)?  TAKE 1 TABLET DAILY  3. Are you having a reaction (difficulty breathing--STAT)? No  4. What is your medication issue? Pt states the last time she was seen her 20 mg was supposed to be changed to 10 mg per doctor. Pt would like to know before she goes to pharmacy to pick up medication she is getting the right one per doctor

## 2017-12-12 NOTE — Telephone Encounter (Signed)
A refill request was received from [facility] for zolpidem 10 mg. Patient;s dosage was reduced to 5 mg, so the request for the 10 mg was declined and a verbal Rx was called in to the pharmacy for zolpidem 5 mg, #30 no refills.   [place] Database shows that last refill on zolpidem was on 11/08/17 for the 10 mg.

## 2017-12-16 ENCOUNTER — Other Ambulatory Visit: Payer: Self-pay | Admitting: Nurse Practitioner

## 2017-12-19 ENCOUNTER — Telehealth: Payer: Self-pay | Admitting: *Deleted

## 2017-12-19 MED ORDER — ZOLPIDEM TARTRATE 10 MG PO TABS: ORAL_TABLET | ORAL | 0 refills | Status: DC

## 2017-12-19 NOTE — Telephone Encounter (Signed)
Patient called and stated that she took her sleeping medication Zolpidem 5mg  at 10:00pm and slept from 11:00-2:00 and woke up with her heart racing and drenched in sweat. Stated that when the pill itself is change she has problems with it. Stated that she wants to go back on the 10mg  and cut it in half. Stated that she has been on this for years and had no problems. Please Advise.

## 2017-12-19 NOTE — Telephone Encounter (Signed)
Patient notified and agreed. Called and spoke with pharmacist. He stated that both the 5mg  and 10mg  is made by the same company but he will fill the 10mg .

## 2017-12-19 NOTE — Telephone Encounter (Signed)
Discussed with Dr Mariea Clonts and It is okay to send in the Ambien 10 mg with the sig: 1/2 tablet at bedtime as needed for sleep #15/0 refill and to make sure she is aware this should last her 30 days and we will not refill early.

## 2017-12-21 ENCOUNTER — Other Ambulatory Visit: Payer: Self-pay

## 2017-12-21 DIAGNOSIS — I10 Essential (primary) hypertension: Secondary | ICD-10-CM

## 2017-12-21 MED ORDER — LABETALOL HCL 200 MG PO TABS: ORAL_TABLET | ORAL | 9 refills | Status: DC

## 2018-01-03 ENCOUNTER — Other Ambulatory Visit: Payer: Self-pay | Admitting: Nurse Practitioner

## 2018-01-03 ENCOUNTER — Other Ambulatory Visit: Payer: Self-pay | Admitting: *Deleted

## 2018-01-03 NOTE — Telephone Encounter (Signed)
Patient called and stated that she would like #30 not #15 of her Zolpidem 10mg  because it cost $8.00 to get #30 and it will last her longer Vs. $6.00 for #15. Patient Stated that she doesn't understand why you don't think she is capable of taking and managing her medication. Stated that she has for years and never had a problem.   I explained to patient that she is only to be taking 1/2 tablet at bedtime and #15 would be a 30 day supply. Patient argued and stated that she understands that completely but for years her Rx was written for 1/2 to 1 tablet and she wants to make her money last.  I explained again how she should be using it and she stated she understood that.   Please Advise.    [place] Verified LR: 12/19/2017 #15

## 2018-01-03 NOTE — Telephone Encounter (Signed)
We will only provide a 30 day supply at this time due to the type of medication. She should only be taking 1/2 tablet 5 mg daily at bedtime

## 2018-01-03 NOTE — Telephone Encounter (Signed)
I left a message for patient to call the office 

## 2018-01-07 NOTE — Telephone Encounter (Signed)
Patient notified. Patient stated that she had 5 tablets left. Will call back around Blas Riches 9 for refill.

## 2018-01-11 ENCOUNTER — Telehealth: Payer: Self-pay | Admitting: *Deleted

## 2018-01-11 ENCOUNTER — Other Ambulatory Visit: Payer: Self-pay | Admitting: Nurse Practitioner

## 2018-01-11 ENCOUNTER — Telehealth: Payer: Self-pay | Admitting: Internal Medicine

## 2018-01-11 NOTE — Telephone Encounter (Signed)
Spoke with patient regarding her Ambien rx. I advised her Dr Caryl Comes does not usually prescribe for this medication. She has c/o her primary care NP who will not refill her prescription since she has been taking 10mg  qhs instead of her prescribed 5mg . I advised her to call the office and ask to speak to her provider directly instead of her RN or MA. Pt verbalized understanding.

## 2018-01-11 NOTE — Telephone Encounter (Signed)
New message   STAT if patient feels like he/she is going to faint   1) Are you dizzy now? YES  2) Do you feel faint or have you passed out? NO  3) Do you have any other symptoms? Trouble sleeping, frequent urination  4) Have you checked your HR and BP (record if available)? 149/91 HR 78 today  Patient's main concern is changing dosage of zolpidem (AMBIEN) 10 MG tablet. Advised patient she may need to contact prescribing physician.

## 2018-01-11 NOTE — Telephone Encounter (Signed)
Patient called and stated that her Cardiologist office told patient to speak with [name] Directly, not her CMA, regarding her Zolpidem Rx. Patient stated that she wants the 10mg  called to her pharmacy. Stated that she cannot rest, dizzy and wobbly and wants her medication changed back to what it has always been. Stated it works for her.  Patient is demanding to speak with [name] directly concerning this. Does not want a call from the [place]. Please call [phone]

## 2018-01-11 NOTE — Telephone Encounter (Signed)
Will have Allen Norris office manager call her and address this. Thank you

## 2018-01-11 NOTE — Telephone Encounter (Signed)
Pt can make an appt if she would like to discuss this again

## 2018-01-11 NOTE — Telephone Encounter (Signed)
Stated that she just can't do that. stated she will not make an appointment. Stated that this is a life or death thing. Stated that she has had more life experience than you. Stated it is Not professional you not calling her back. Wants her Ambien the way she had it before because she can function with it. Wants the 10mg  back. Stated that your gonna have to realize she is desperate. Stated she does not need hormones at her age. Stated that she may be old but she knows her body. Taken the medication for 30 years. Demands for you to call her.

## 2018-01-13 ENCOUNTER — Other Ambulatory Visit: Payer: Self-pay | Admitting: Nurse Practitioner

## 2018-01-30 ENCOUNTER — Other Ambulatory Visit: Payer: Self-pay | Admitting: Internal Medicine

## 2018-02-16 ENCOUNTER — Other Ambulatory Visit: Payer: Self-pay | Admitting: Internal Medicine

## 2018-03-05 ENCOUNTER — Other Ambulatory Visit: Payer: Self-pay | Admitting: Internal Medicine

## 2018-03-05 NOTE — Telephone Encounter (Signed)
Pt last saw Dr Caryl Comes 11/28/17, last labs 11/28/17 Creat 0.93, age 84, weight 83kg, CrCl 61.11, based on CrCl pt is on appropriate dosage of Xarelto 20mg  QD.  Will refill rx.

## 2018-03-07 ENCOUNTER — Telehealth: Payer: Self-pay | Admitting: Internal Medicine

## 2018-03-07 DIAGNOSIS — I10 Essential (primary) hypertension: Secondary | ICD-10-CM

## 2018-03-07 MED ORDER — LABETALOL HCL 200 MG PO TABS: ORAL_TABLET | ORAL | 11 refills | Status: DC

## 2018-03-07 MED ORDER — VALSARTAN 160 MG PO TABS: 80.0000 mg | ORAL_TABLET | Freq: Two times a day (BID) | ORAL | 11 refills | Status: DC

## 2018-03-07 NOTE — Telephone Encounter (Signed)
Please advise on refill requests, how these need to be ordered and if okay to refill. Thanks, MI

## 2018-03-07 NOTE — Telephone Encounter (Signed)
°  Requested Prescriptions    No prescriptions requested or ordered in this encounter   Pt called and needs a refill on her DIOVAN 160 mg and LABETALOL 20 mg ....she takes more  Diovan as instructed.

## 2018-03-07 NOTE — Telephone Encounter (Signed)
Pt calling today for refills on her labetolol and valsartan. Scripts sent in. Pt to see Dr Caryl Comes in August.

## 2018-03-17 ENCOUNTER — Other Ambulatory Visit: Payer: Self-pay | Admitting: Nurse Practitioner

## 2018-03-18 ENCOUNTER — Other Ambulatory Visit: Payer: Self-pay | Admitting: Nurse Practitioner

## 2018-04-05 ENCOUNTER — Other Ambulatory Visit: Payer: Self-pay | Admitting: Internal Medicine

## 2018-05-08 ENCOUNTER — Encounter

## 2018-05-08 ENCOUNTER — Other Ambulatory Visit: Payer: Self-pay | Admitting: Nurse Practitioner

## 2018-05-08 ENCOUNTER — Ambulatory Visit: Payer: Medicare Other | Admitting: Internal Medicine

## 2018-05-08 ENCOUNTER — Encounter: Payer: Self-pay | Admitting: Internal Medicine

## 2018-05-08 VITALS — BP 142/86 | HR 74 | Ht 65.0 in | Wt 182.6 lb

## 2018-05-08 DIAGNOSIS — I482 Chronic atrial fibrillation: Secondary | ICD-10-CM | POA: Diagnosis not present

## 2018-05-08 DIAGNOSIS — I10 Essential (primary) hypertension: Secondary | ICD-10-CM | POA: Diagnosis not present

## 2018-05-08 DIAGNOSIS — Z8673 Personal history of transient ischemic attack (TIA), and cerebral infarction without residual deficits: Secondary | ICD-10-CM

## 2018-05-08 DIAGNOSIS — I4821 Permanent atrial fibrillation: Secondary | ICD-10-CM

## 2018-05-08 NOTE — Progress Notes (Signed)
T       Patient Care Team: Marton Redwood, MD as PCP - General (Internal Medicine) Deboraha Sprang, MD as Consulting Physician (Cardiology) Shon Hough, MD as Consulting Physician (Ophthalmology) Sanjuana Mae, MD as Referring Physician (Vascular Surgery) Sharmon Revere as Physician Assistant (Cardiology)   HPI  Tiffany Black is a 84 y.o. female Seen in followup for atrial fibrillation now permanent   TIA  On anticoagulation w Rivaroxaban   Echo 8/16 demonstrated normal LV function and mild LVH and left atrial enlargement Carotid < 40% bilateral stenosis 7/18 Echo normal LV function with LVH   Date Cr K Hgb Mg  4/18  0.81 4.2     10/18  1.03(GFR 48)   4.0 13.5              Addition of metoprolol to her labetalol has been well-tolerated and has been associate with far fewer palpitations and better blood pressure control  No edema.  No chest pain.  No breathlessness   Past Medical History:  Diagnosis Date  . Anxiety   . Arthritis   . Bulging of cervical intervertebral disc   . Cardiomegaly   . Cataracts, bilateral   . Chronic neck pain   . History of endometrial cancer   . History of stroke    2015 and 2016 // carotid US 8/16: Bilateral 1-39 // carotid US 2/16: Bilateral ICA 1-39  . HLD (hyperlipidemia)   . Hx of echocardiogram    a. Echo (11/15): Mild LVH, EF 55-60%, normal wall motion, grade 2 diastolic dysfunction, mild LAE, normal RV function, PASP 29 mm Hg // echo 8/16:Mild concentric LVH, EF 55-60, normal wall motion, grade 2 diastolic dysfunction, mild LAE, PASP 39  . Hypertension   . Insomnia   . Lung nodule   . OA (osteoarthritis)   . Pectus excavatum   . Persistent atrial fibrillation ([place])   . Vertigo     Past Surgical History:  Procedure Laterality Date  . ABDOMINAL HYSTERECTOMY  2011  . CATARACT EXTRACTION, BILATERAL Bilateral 2009  . CESAREAN SECTION  1975  . LAPAROSCOPIC HYSTERECTOMY  2011    Current Outpatient Medications   Medication Sig Dispense Refill  . acetaminophen (TYLENOL) 325 MG tablet Take 650 mg by mouth at bedtime as needed for headache (pain).     . cholecalciferol (VITAMIN D) 1000 UNITS tablet Take 1,000 Units by mouth daily after lunch.     . felodipine (PLENDIL) 5 MG 24 hr tablet TAKE 1/2 TABLET BY MOUTH DAILY. 15 tablet 9  . FLUoxetine (PROZAC) 10 MG tablet TAKE 1 TABLET BY MOUTH ONCE DAILY AFTER LUNCH 90 tablet 1  . hydrocortisone (ANUSOL-HC) 2.5 % rectal cream Place 1 application rectally as needed for hemorrhoids or anal itching.    Marland Kitchen KLOR-CON M20 20 MEQ tablet TAKE 1/2 TABLET BY MOUTH ONCE DAILY 15 tablet 6  . labetalol (NORMODYNE) 200 MG tablet Take 500 mg by mouth as directed.    . meclizine (ANTIVERT) 25 MG tablet Take 1 tablet (25 mg total) by mouth daily as needed for dizziness. 30 tablet 0  . Menthol, Topical Analgesic, (BENGAY EX) Apply 1 application topically at bedtime as needed (neck pain).    . metoprolol tartrate (LOPRESSOR) 50 MG tablet TAKE 1/2 TABLET BY MOUTH TWICE DAILY 90 tablet 3  . rivaroxaban (XARELTO) 20 MG TABS tablet Take 20 mg by mouth daily with supper.    . valsartan (DIOVAN) 160 MG tablet Take 0.5 tablets (80  mg total) by mouth 2 (two) times daily. 30 tablet 11  . zolpidem (AMBIEN) 10 MG tablet Take 1/2 tablet by mouth at bedtime for rest 15 tablet 0   No current facility-administered medications for this visit.     Allergies  Allergen Reactions  . Hydrochlorothiazide Other (See Comments) and Anaphylaxis    Unknown allergic reaction  . Penicillins Hives and Anaphylaxis    Has patient had a PCN reaction causing immediate rash, facial/tongue/throat swelling, SOB or lightheadedness with hypotension: Yes Has patient had a PCN reaction causing severe rash involving mucus membranes or skin necrosis: No Has patient had a PCN reaction that required hospitalization No Has patient had a PCN reaction occurring within the last 10 years: No If all of the above answers are  "NO", then may proceed with Cephalosporin use.   . Ciprofloxacin Other (See Comments)    Blood pressure issues  . Hydrocodone-Acetaminophen Other (See Comments)    unknown  . Iodine-131 Other (See Comments)    Raises blood pressure  . Latex Itching  . Statins     Muscle weakness  . Carbamazepine Other (See Comments) and Rash    Flushed blood pressure medication out of system  . Codeine Nausea And Vomiting and Nausea Only    Nausea and vomiting  . Sulfamethoxazole Rash  . Sulfonamide Derivatives Hives    Hives     Review of Systems negative except from HPI and PMH  Physical Exam BP (!) 142/86   Pulse 74   Ht 5\' 5"  (1.651 m)   Wt 182 lb 9.6 oz (82.8 kg)   SpO2 96%   BMI 30.39 kg/m  Well developed and nourished in no acute distress HENT normal Neck supple with JVP-flat Carotids brisk and full without bruits Clear Irregularly irregular rate and rhythm with controlled ventricular response, no murmurs or gallops Abd-soft with active BS without hepatomegaly No Clubbing cyanosis edema Skin-warm and dry A & Oriented  Grossly normal sensory and motor function   ECG afib @ 74 -/11/38  Assessment and  Plan  Hypertension  Atrial fibrillation - permanent  TIA  Renal insufficiency grade 3  Atrial fibrillation permanent  On Anticoagulation;  No bleeding issues will check hemoglobin today and renal function  Blood pressure well controlled  Scant palpitations  Despite her very complex medical regime, she would like to continue which she is doing as she is feeling as well as she has for some time.  We spent more than 50% of our >25 min visit in face to face counseling regarding the above

## 2018-05-08 NOTE — Patient Instructions (Signed)
Medication Instructions:  Your physician recommends that you continue on your current medications as directed. Please refer to the Current Medication list given to you today.  Labwork: You will have labs drawn today: CBC BMP   Testing/Procedures: None ordered.  Follow-Up: Your physician wants you to follow-up in: 6 months with Dr Caryl Comes. You will receive a reminder letter in the mail two months in advance. If you don't receive a letter, please call our office to schedule the follow-up appointment.   Any Other Special Instructions Will Be Listed Below (If Applicable).     If you need a refill on your cardiac medications before your next appointment, please call your pharmacy.

## 2018-05-09 LAB — BASIC METABOLIC PANEL
BUN/Creatinine Ratio: 17 (ref 12–28)
BUN: 17 mg/dL (ref 8–27)
CO2: 25 mmol/L (ref 20–29)
CREATININE: 1.03 mg/dL — AB (ref 0.57–1.00)
Calcium: 9.6 mg/dL (ref 8.7–10.3)
Chloride: 105 mmol/L (ref 96–106)
GFR calc Af Amer: 58 mL/min/{1.73_m2} — ABNORMAL LOW (ref >59)
GFR calc non Af Amer: 51 mL/min/{1.73_m2} — ABNORMAL LOW (ref >59)
GLUCOSE: 91 mg/dL (ref 65–99)
Potassium: 4.5 mmol/L (ref 3.5–5.2)
SODIUM: 143 mmol/L (ref 134–144)

## 2018-05-09 LAB — CBC
Hematocrit: 40.3 % (ref 34.0–46.6)
Hemoglobin: 13.3 g/dL (ref 11.1–15.9)
MCH: 30.5 pg (ref 26.6–33.0)
MCHC: 33 g/dL (ref 31.5–35.7)
MCV: 92 fL (ref 79–97)
PLATELETS: 166 10*3/uL (ref 150–450)
RBC: 4.36 x10E6/uL (ref 3.77–5.28)
RDW: 14 % (ref 12.3–15.4)
WBC: 6.8 10*3/uL (ref 3.4–10.8)

## 2018-05-10 ENCOUNTER — Telehealth: Payer: Self-pay

## 2018-05-10 NOTE — Telephone Encounter (Signed)
I called patient to confirm that she is no longer under the care of Sherrie Mustache, NP.   Patient confirmed that her PCP is Dr.Shaw

## 2018-05-14 ENCOUNTER — Telehealth: Payer: Self-pay

## 2018-05-14 NOTE — Telephone Encounter (Signed)
Pt does not need to come off anticoagulants for routine dental procedures (cleanings and fillings.)

## 2018-05-14 NOTE — Telephone Encounter (Signed)
I called and spoke with patient about her lab results. She states that she has to go to the dentist and have 2 fillings done. She wanted to know if she needed to stop Xarelto before having the fillings done? She has not scheduled the appointment, she wants to make sure she does not need to come off Xarelto first.

## 2018-05-14 NOTE — Telephone Encounter (Signed)
Called and spoke with patient, she is aware that with cleanings and fillings she will not need to come off Xarelto. Patient verbalized understanding.

## 2018-05-20 ENCOUNTER — Ambulatory Visit: Payer: Medicare Other

## 2018-05-20 ENCOUNTER — Ambulatory Visit: Payer: Medicare Other | Admitting: Internal Medicine

## 2018-08-26 ENCOUNTER — Other Ambulatory Visit: Payer: Self-pay | Admitting: Nurse Practitioner

## 2018-09-10 ENCOUNTER — Other Ambulatory Visit: Payer: Self-pay | Admitting: Nurse Practitioner

## 2018-09-10 DIAGNOSIS — F419 Anxiety disorder, unspecified: Secondary | ICD-10-CM

## 2018-09-16 ENCOUNTER — Other Ambulatory Visit: Payer: Self-pay | Admitting: *Deleted

## 2018-09-16 MED ORDER — RIVAROXABAN 20 MG PO TABS: 20.0000 mg | ORAL_TABLET | Freq: Every day | ORAL | 8 refills | Status: DC

## 2018-09-16 NOTE — Telephone Encounter (Signed)
Pt is a 86 yr old female who last saw Dr. Caryl Comes on 05/08/18, weight at that visit  82.8Kg. SCr on 05/08/18 was 1.03, CrCl is 55 mL/min, will refill Xarelto 20mg  QD.

## 2018-09-16 NOTE — Addendum Note (Signed)
Addended by: Zenovia Jarred on: 09/16/2018 05:07 PM   Modules accepted: Orders

## 2018-09-26 ENCOUNTER — Other Ambulatory Visit: Payer: Self-pay | Admitting: Nurse Practitioner

## 2018-09-26 DIAGNOSIS — F419 Anxiety disorder, unspecified: Secondary | ICD-10-CM

## 2018-10-16 ENCOUNTER — Ambulatory Visit (HOSPITAL_COMMUNITY)
Admission: EM | Admit: 2018-10-16 | Discharge: 2018-10-16 | Disposition: A | Discharge status: Home / Self Care | Payer: Medicare Other | Attending: Emergency Medicine | Admitting: Emergency Medicine

## 2018-10-16 ENCOUNTER — Encounter (HOSPITAL_COMMUNITY): Payer: Self-pay

## 2018-10-16 DIAGNOSIS — S61208A Unspecified open wound of other finger without damage to nail, initial encounter: Secondary | ICD-10-CM

## 2018-10-16 DIAGNOSIS — S61210A Laceration without foreign body of right index finger without damage to nail, initial encounter: Secondary | ICD-10-CM

## 2018-10-16 DIAGNOSIS — W25XXXA Contact with sharp glass, initial encounter: Secondary | ICD-10-CM | POA: Diagnosis not present

## 2018-10-16 DIAGNOSIS — Z23 Encounter for immunization: Secondary | ICD-10-CM | POA: Diagnosis not present

## 2018-10-16 MED ORDER — LIDOCAINE-EPINEPHRINE-TETRACAINE (LET) SOLUTION
NASAL | Status: AC
  Filled 2018-10-16: qty 3

## 2018-10-16 MED ORDER — LIDOCAINE-EPINEPHRINE-TETRACAINE (LET) SOLUTION
3.0000 mL | Freq: Once | NASAL | Status: AC
  Administered 2018-10-16: 3 mL via TOPICAL

## 2018-10-16 MED ORDER — TETANUS-DIPHTH-ACELL PERTUSSIS 5-2.5-18.5 LF-MCG/0.5 IM SUSP
0.5000 mL | Freq: Once | INTRAMUSCULAR | Status: AC
  Administered 2018-10-16: 0.5 mL via INTRAMUSCULAR

## 2018-10-16 MED ORDER — TETANUS-DIPHTH-ACELL PERTUSSIS 5-2.5-18.5 LF-MCG/0.5 IM SUSP
INTRAMUSCULAR | Status: AC
  Filled 2018-10-16: qty 0.5

## 2018-10-16 NOTE — ED Triage Notes (Signed)
Pt present a laceration on her right hand/ index finger. Pt states she dropped a glass of juice and went to pick up the glass and cut her finger.

## 2018-10-16 NOTE — ED Provider Notes (Signed)
[place]    CSN: [id number] Arrival date & time: 10/16/18  1600     History   Chief Complaint Chief Complaint  Patient presents with  . Laceration    Right hand index finger     HPI Tiffany Black is a 85 y.o. female.   85 year old female with history of chronic A. fib on Xarelto comes in for laceration to the right index finger.  States a glass broke, and she sustained a laceration while trying to pick up the glass.  EMS was called, and helped her up the finger up.  States it continued to bleed and came in for evaluation.  She denies numbness, tingling to the fingers.  Able to move finger without difficulty.  Has not cleaned/irrigated the wound.      Past Medical History:  Diagnosis Date  . Anxiety   . Arthritis   . Bulging of cervical intervertebral disc   . Cardiomegaly   . Cataracts, bilateral   . Chronic neck pain   . History of endometrial cancer   . History of stroke    2015 and 2016 // carotid US 8/16: Bilateral 1-39 // carotid US 2/16: Bilateral ICA 1-39  . HLD (hyperlipidemia)   . Hx of echocardiogram    a. Echo (11/15): Mild LVH, EF 55-60%, normal wall motion, grade 2 diastolic dysfunction, mild LAE, normal RV function, PASP 29 mm Hg // echo 8/16:Mild concentric LVH, EF 55-60, normal wall motion, grade 2 diastolic dysfunction, mild LAE, PASP 39  . Hypertension   . Insomnia   . Lung nodule   . OA (osteoarthritis)   . Pectus excavatum   . Persistent atrial fibrillation   . Vertigo     Patient Active Problem List   Diagnosis Date Noted  . Headache syndrome 07/02/2017  . Chest pain 05/23/2015  . Essential hypertension   . History of stroke 05/10/2015  . Dizziness 05/10/2015  . Orthostatic hypotension 05/10/2015  . CVA (cerebral infarction) 05/10/2015  . Hypokalemia 09/30/2013  . Vertigo 02/28/2013  . Sinusitis 02/28/2013  . Anxiety 02/28/2013  . PAC (premature atrial contraction) 04/10/2012  . Abnormal EKG 01/19/2012  . Persistent  atrial fibrillation 02/15/2010    Past Surgical History:  Procedure Laterality Date  . ABDOMINAL HYSTERECTOMY  2011  . CATARACT EXTRACTION, BILATERAL Bilateral 2009  . CESAREAN SECTION  1975  . LAPAROSCOPIC HYSTERECTOMY  2011    OB History   No obstetric history on file.      Home Medications    Prior to Admission medications   Medication Sig Start Date End Date Taking? Authorizing Provider  acetaminophen (TYLENOL) 325 MG tablet Take 650 mg by mouth at bedtime as needed for headache (pain).     [provider]  cholecalciferol (VITAMIN D) 1000 UNITS tablet Take 1,000 Units by mouth daily after lunch.     [provider]  felodipine (PLENDIL) 5 MG 24 hr tablet TAKE 1/2 TABLET BY MOUTH DAILY. 02/18/18   Deboraha Sprang, MD  FLUoxetine (PROZAC) 10 MG tablet TAKE 1 TABLET BY MOUTH ONCE DAILY AFTER LUNCH 11/14/17   Lauree Chandler, NP  hydrocortisone (ANUSOL-HC) 2.5 % rectal cream Place 1 application rectally as needed for hemorrhoids or anal itching.    [provider]  KLOR-CON M20 20 MEQ tablet TAKE 1/2 TABLET BY MOUTH ONCE DAILY 10/03/17   Lauree Chandler, NP  labetalol (NORMODYNE) 200 MG tablet Take 500 mg by mouth as directed.    [provider]  meclizine (ANTIVERT) 25 MG tablet Take 1 tablet (25 mg total) by mouth daily as needed for dizziness. 03/19/17   Lauree Chandler, NP  Menthol, Topical Analgesic, (BENGAY EX) Apply 1 application topically at bedtime as needed (neck pain).    [provider]  metoprolol tartrate (LOPRESSOR) 50 MG tablet TAKE 1/2 TABLET BY MOUTH TWICE DAILY 01/31/18   Deboraha Sprang, MD  rivaroxaban (XARELTO) 20 MG TABS tablet Take 1 tablet (20 mg total) by mouth daily with supper. 09/16/18   Deboraha Sprang, MD  valsartan (DIOVAN) 160 MG tablet Take 0.5 tablets (80 mg total) by mouth 2 (two) times daily. 03/07/18   Deboraha Sprang, MD  zolpidem Lorrin Mais) 10 MG tablet Take 1/2 tablet by mouth at bedtime for rest  12/19/17   Lauree Chandler, NP    Family History Family History  Problem Relation Age of Onset  . Dementia Mother   . Heart attack Father   . Heart disease Father   . Stroke Maternal Uncle   . Hypertension Neg Hx     Social History Social History   Tobacco Use  . Smoking status: Never Smoker  . Smokeless tobacco: Never Used  Substance Use Topics  . Alcohol use: No  . Drug use: No     Allergies   Hydrochlorothiazide; Penicillins; Ciprofloxacin; Hydrocodone-acetaminophen; Iodine-131; Latex; Statins; Carbamazepine; Codeine; Sulfamethoxazole; and Sulfonamide derivatives   Review of Systems Review of Systems  Reason unable to perform ROS: See HPI as above.     Physical Exam Triage Vital Signs ED Triage Vitals [10/16/18 1703]  Enc Vitals Group     BP (!) 172/88     Pulse Rate 80     Resp 18     Temp (!) 97.4 F (36.3 C)     Temp Source Oral     SpO2 99 %     Weight      Height      Head Circumference      Peak Flow      Pain Score      Pain Loc      Pain Edu?      Excl. in [place]?    No data found.  Updated Vital Signs BP (!) 172/88 (BP Location: Left Arm)   Pulse 80   Temp (!) 97.4 F (36.3 C) (Oral)   Resp 18   SpO2 99%   Physical Exam Constitutional:      General: She is not in acute distress.    Appearance: She is well-developed. She is not diaphoretic.  HENT:     Head: Normocephalic and atraumatic.  Eyes:     Conjunctiva/sclera: Conjunctivae normal.     Pupils: Pupils are equal, round, and reactive to light.  Musculoskeletal:     Comments: Avulsion of skin about 0.5cm x 0.2 cm on right index finger. Bleeding controlled with pressure. Full ROM of finger. Sensation intact and equal. Cap refill <2s  Neurological:     Mental Status: She is alert and oriented to person, place, and time.      UC Treatments / Results  Labs (all labs ordered are listed, but only abnormal results are displayed) Labs Reviewed - No data to  display  EKG None  Radiology No results found.  Procedures Laceration Repair Date/Time: 10/16/2018 6:00 PM Performed by: Ok Edwards, PA-C Authorized by: Melynda Ripple, MD   Consent:    Consent obtained:  Verbal   Consent given by:  Patient  Risks discussed:  Infection, pain, poor cosmetic result, poor wound healing and need for additional repair   Alternatives discussed:  No treatment Anesthesia (see MAR for exact dosages):    Anesthesia method:  Topical application   Topical anesthetic:  LET Laceration details:    Location:  Finger   Finger location:  R index finger   Wound length (cm): 0.5 x 0.2.   Laceration depth: 2. Repair type:    Repair type:  Simple Pre-procedure details:    Preparation:  Patient was prepped and draped in usual sterile fashion Exploration:    Hemostasis achieved with:  LET   Wound exploration: wound explored through full range of motion and entire depth of wound probed and visualized   Treatment:    Area cleansed with:  Hibiclens   Amount of cleaning:  Standard   Irrigation solution:  Sterile saline   Irrigation method:  Pressure wash and tap Skin repair:    Repair method:  Tissue adhesive Approximation:    Laceration repair approximation: no approximation given avulsion. tissue adhesive to close wound.  Post-procedure details:    Dressing:  Open (no dressing)   Patient tolerance of procedure:  Tolerated well, no immediate complications   (including critical care time)  Medications Ordered in UC Medications  lidocaine-EPINEPHrine-tetracaine (LET) solution (3 mLs Topical Given 10/16/18 1730)  Tdap (BOOSTRIX) injection 0.5 mL (0.5 mLs Intramuscular Given 10/16/18 1748)    Initial Impression / Assessment and Plan / UC Course  I have reviewed the triage vital signs and the nursing notes.  Pertinent labs & imaging results that were available during my care of the patient were reviewed by me and considered in my medical decision making (see  chart for details).    Discussed treatment options including pressure dressing vs surgicel vs dermabond. Patient would like to proceed with dermabond. dermabond applied onto avulsion. No further bleeding visualized.   Patient's last tetanus in 2014. Discussed no strong indication for tetanus update given clean glass cup that is indoor use for beverages. Patient would like to update tetanus. Tetanus updated today. Wound care instructions given. Return precautions given. Patient expresses understanding and agrees to plan.  Final Clinical Impressions(s) / UC Diagnoses   Final diagnoses:  Avulsion of skin of index finger, initial encounter    ED Prescriptions    None        Ok Edwards, PA-C 10/16/18 1804

## 2018-10-16 NOTE — Discharge Instructions (Signed)
Tetanus updated. Adhesive glue placed. I have attached the information about the glue for you. Do not soak area in water. Monitor for spreading redness, increased warmth, increased swelling, fever, follow up for reevaluation needed. Otherwise, the glue will fall off on own when the wound is healed.

## 2018-10-18 ENCOUNTER — Encounter (HOSPITAL_COMMUNITY): Payer: Self-pay | Admitting: Emergency Medicine

## 2018-10-18 ENCOUNTER — Emergency Department (HOSPITAL_COMMUNITY)
Admission: EM | Admit: 2018-10-18 | Discharge: 2018-10-19 | Disposition: A | Discharge status: Home / Self Care | Payer: Medicare Other | Attending: Emergency Medicine | Admitting: Emergency Medicine

## 2018-10-18 DIAGNOSIS — I4819 Other persistent atrial fibrillation: Secondary | ICD-10-CM | POA: Insufficient documentation

## 2018-10-18 DIAGNOSIS — Z91040 Latex allergy status: Secondary | ICD-10-CM | POA: Insufficient documentation

## 2018-10-18 DIAGNOSIS — Z8673 Personal history of transient ischemic attack (TIA), and cerebral infarction without residual deficits: Secondary | ICD-10-CM | POA: Diagnosis not present

## 2018-10-18 DIAGNOSIS — Z79899 Other long term (current) drug therapy: Secondary | ICD-10-CM | POA: Diagnosis not present

## 2018-10-18 DIAGNOSIS — I10 Essential (primary) hypertension: Secondary | ICD-10-CM | POA: Insufficient documentation

## 2018-10-18 DIAGNOSIS — R197 Diarrhea, unspecified: Secondary | ICD-10-CM | POA: Insufficient documentation

## 2018-10-18 DIAGNOSIS — Z7901 Long term (current) use of anticoagulants: Secondary | ICD-10-CM | POA: Diagnosis not present

## 2018-10-18 DIAGNOSIS — E785 Hyperlipidemia, unspecified: Secondary | ICD-10-CM | POA: Diagnosis not present

## 2018-10-18 DIAGNOSIS — R112 Nausea with vomiting, unspecified: Secondary | ICD-10-CM | POA: Diagnosis not present

## 2018-10-18 LAB — COMPREHENSIVE METABOLIC PANEL
ALT: 16 U/L (ref 0–44)
AST: 22 U/L (ref 15–41)
Albumin: 3.9 g/dL (ref 3.5–5.0)
Alkaline Phosphatase: 61 U/L (ref 38–126)
Anion gap: 14 (ref 5–15)
BUN: 17 mg/dL (ref 8–23)
CO2: 23 mmol/L (ref 22–32)
Calcium: 9.1 mg/dL (ref 8.9–10.3)
Chloride: 103 mmol/L (ref 98–111)
Creatinine, Ser: 0.94 mg/dL (ref 0.44–1.00)
GFR calc Af Amer: >60 mL/min (ref >60)
GFR calc non Af Amer: 56 mL/min — ABNORMAL LOW (ref >60)
GLUCOSE: 165 mg/dL — AB (ref 70–99)
Potassium: 4 mmol/L (ref 3.5–5.1)
Sodium: 140 mmol/L (ref 135–145)
Total Bilirubin: 0.6 mg/dL (ref 0.3–1.2)
Total Protein: 6.7 g/dL (ref 6.5–8.1)

## 2018-10-18 LAB — CBC WITH DIFFERENTIAL/PLATELET
Abs Immature Granulocytes: 0.04 10*3/uL (ref 0.00–0.07)
Basophils Absolute: 0 10*3/uL (ref 0.0–0.1)
Basophils Relative: 0 %
Eosinophils Absolute: 0.1 10*3/uL (ref 0.0–0.5)
Eosinophils Relative: 2 %
HCT: 41.9 % (ref 36.0–46.0)
HEMOGLOBIN: 13.2 g/dL (ref 12.0–15.0)
Immature Granulocytes: 0 %
Lymphocytes Relative: 6 %
Lymphs Abs: 0.6 10*3/uL — ABNORMAL LOW (ref 0.7–4.0)
MCH: 29.7 pg (ref 26.0–34.0)
MCHC: 31.5 g/dL (ref 30.0–36.0)
MCV: 94.4 fL (ref 80.0–100.0)
MONO ABS: 0.6 10*3/uL (ref 0.1–1.0)
Monocytes Relative: 6 %
Neutro Abs: 8.3 10*3/uL — ABNORMAL HIGH (ref 1.7–7.7)
Neutrophils Relative %: 86 %
Platelets: 162 10*3/uL (ref 150–400)
RBC: 4.44 MIL/uL (ref 3.87–5.11)
RDW: 12.8 % (ref 11.5–15.5)
WBC: 9.6 10*3/uL (ref 4.0–10.5)
nRBC: 0 % (ref 0.0–0.2)

## 2018-10-18 LAB — URINALYSIS, ROUTINE W REFLEX MICROSCOPIC
BILIRUBIN URINE: NEGATIVE
Glucose, UA: 50 mg/dL — AB
HGB URINE DIPSTICK: NEGATIVE
Ketones, ur: NEGATIVE mg/dL
Leukocytes, UA: NEGATIVE
Nitrite: NEGATIVE
Protein, ur: NEGATIVE mg/dL
Specific Gravity, Urine: 1.011 (ref 1.005–1.030)
pH: 7 (ref 5.0–8.0)

## 2018-10-18 LAB — LIPASE, BLOOD: Lipase: 50 U/L (ref 11–51)

## 2018-10-18 LAB — I-STAT TROPONIN, ED: Troponin i, poc: 0.01 ng/mL (ref 0.00–0.08)

## 2018-10-18 MED ORDER — ONDANSETRON HCL 4 MG/2ML IJ SOLN
4.0000 mg | Freq: Once | INTRAMUSCULAR | Status: AC
  Administered 2018-10-19: 4 mg via INTRAVENOUS
  Filled 2018-10-18: qty 2

## 2018-10-18 MED ORDER — SODIUM CHLORIDE 0.9 % IV BOLUS
1000.0000 mL | Freq: Once | INTRAVENOUS | Status: AC
  Administered 2018-10-19: 1000 mL via INTRAVENOUS

## 2018-10-18 MED ORDER — SODIUM CHLORIDE 0.9 % IV SOLN: INTRAVENOUS | Status: DC | PRN

## 2018-10-18 NOTE — ED Triage Notes (Signed)
  Patient BIB EMS for abdominal pain with N/V/D.  Patient states it started today after taking her first dose of doxycycline.  Patient was prescribed antibiotic for neck pain.  Patient is A&O x4.  Patient is ambulatory and states it only hurts when she poops.

## 2018-10-18 NOTE — ED Provider Notes (Signed)
[facility] [place] EMERGENCY DEPARTMENT Provider Note   CSN: [id number] Arrival date & time: 10/18/18  2153     History   Chief Complaint Chief Complaint  Patient presents with  . Abdominal Pain  . Nausea    HPI Tiffany Black is a 85 y.o. female with a hx of anxiety, bulging cervical disc, vertigo, cardiomegaly, HLD, HTN, a-fib on Xarelto presents to the Emergency Department complaining of acute, persistent nausea, vomiting and diarrhea onset around 6pm.  Pt reports chronic neck pain 2/2 bulging discs present for years and unchanged today.  Pt reports she went to Dr. Raul Del office and saw a PA today who prescribed Doxycycline.  Pt reports taking 1 dose around 5pm and then began vomiting approx 1 hour later.  Pt reports several episodes of vomiting.  Emesis was NBNB.  Pt also reports 2-3 episodes of loose stool without melena or hematochezia.  Pt reports no sick contacts.  Pt reports she had abd cramping during the vomiting but no abd pain now.  Pt denies fever, chills, headache, chest pain, SOB, weakness, dizziness, syncope, dysuria.  Pt reports no aggravating or alleviating factors.   Pt reports a hx of abd hysterectomy 2 years ago, but no other abd surgeries.  Pt denies falls or known injury to her neck, cortisone injections, or procedures in her neck.  Pt without numbness, tingling, weakness in her arms or legs.     The history is provided by the patient and medical records. No language interpreter was used.    Past Medical History:  Diagnosis Date  . Anxiety   . Arthritis   . Bulging of cervical intervertebral disc   . Cardiomegaly   . Cataracts, bilateral   . Chronic neck pain   . History of endometrial cancer   . History of stroke    2015 and 2016 // carotid US 8/16: Bilateral 1-39 // carotid US 2/16: Bilateral ICA 1-39  . HLD (hyperlipidemia)   . Hx of echocardiogram    a. Echo (11/15): Mild LVH, EF 55-60%, normal wall motion, grade 2 diastolic dysfunction, mild  LAE, normal RV function, PASP 29 mm Hg // echo 8/16:Mild concentric LVH, EF 55-60, normal wall motion, grade 2 diastolic dysfunction, mild LAE, PASP 39  . Hypertension   . Insomnia   . Lung nodule   . OA (osteoarthritis)   . Pectus excavatum   . Persistent atrial fibrillation   . Vertigo     Patient Active Problem List   Diagnosis Date Noted  . Headache syndrome 07/02/2017  . Chest pain 05/23/2015  . Essential hypertension   . History of stroke 05/10/2015  . Dizziness 05/10/2015  . Orthostatic hypotension 05/10/2015  . CVA (cerebral infarction) 05/10/2015  . Hypokalemia 09/30/2013  . Vertigo 02/28/2013  . Sinusitis 02/28/2013  . Anxiety 02/28/2013  . PAC (premature atrial contraction) 04/10/2012  . Abnormal EKG 01/19/2012  . Persistent atrial fibrillation 02/15/2010    Past Surgical History:  Procedure Laterality Date  . ABDOMINAL HYSTERECTOMY  2011  . CATARACT EXTRACTION, BILATERAL Bilateral 2009  . CESAREAN SECTION  1975  . LAPAROSCOPIC HYSTERECTOMY  2011     OB History   No obstetric history on file.      Home Medications    Prior to Admission medications   Medication Sig Start Date End Date Taking? Authorizing Provider  acetaminophen (TYLENOL) 325 MG tablet Take 650 mg by mouth at bedtime as needed for headache (pain).    Yes [provider]  cholecalciferol (VITAMIN D) 1000 UNITS tablet Take 1,000 Units by mouth daily after lunch.    Yes [provider]  doxycycline (VIBRA-TABS) 100 MG tablet Take 100 mg by mouth 2 (two) times daily.   Yes [provider]  felodipine (PLENDIL) 5 MG 24 hr tablet TAKE 1/2 TABLET BY MOUTH DAILY. 02/18/18  Yes Deboraha Sprang, MD  FLUoxetine (PROZAC) 10 MG tablet TAKE 1 TABLET BY MOUTH ONCE DAILY AFTER LUNCH Patient taking differently: Take 10 mg by mouth daily.  11/14/17  Yes Eubanks, Carlos American, NP  KLOR-CON M20 20 MEQ tablet TAKE 1/2 TABLET BY MOUTH ONCE DAILY Patient taking differently: Take 10 mEq by  mouth daily.  10/03/17  Yes Lauree Chandler, NP  labetalol (NORMODYNE) 200 MG tablet Take 500 mg by mouth See admin instructions. Take 1 tablet every morning and take 1 and 1/2 tablets at night   Yes [provider]  metoprolol tartrate (LOPRESSOR) 50 MG tablet TAKE 1/2 TABLET BY MOUTH TWICE DAILY Patient taking differently: Take 25 mg by mouth 2 (two) times daily.  01/31/18  Yes Deboraha Sprang, MD  rivaroxaban (XARELTO) 20 MG TABS tablet Take 1 tablet (20 mg total) by mouth daily with supper. 09/16/18  Yes Deboraha Sprang, MD  valsartan (DIOVAN) 160 MG tablet Take 0.5 tablets (80 mg total) by mouth 2 (two) times daily. 03/07/18  Yes Deboraha Sprang, MD  zolpidem (AMBIEN) 10 MG tablet Take 1/2 tablet by mouth at bedtime for rest Patient taking differently: Take 5-10 mg by mouth at bedtime.  12/19/17  Yes Lauree Chandler, NP  meclizine (ANTIVERT) 25 MG tablet Take 1 tablet (25 mg total) by mouth daily as needed for dizziness. Patient not taking: Reported on 10/18/2018 03/19/17   Lauree Chandler, NP  ondansetron [facility] ODT) 4 MG disintegrating tablet 4mg  ODT q4 hours prn nausea/vomit 10/19/18   Caldwell Kronenberger, Jarrett Soho, PA-C    Family History Family History  Problem Relation Age of Onset  . Dementia Mother   . Heart attack Father   . Heart disease Father   . Stroke Maternal Uncle   . Hypertension Neg Hx     Social History Social History   Tobacco Use  . Smoking status: Never Smoker  . Smokeless tobacco: Never Used  Substance Use Topics  . Alcohol use: No  . Drug use: No     Allergies   Hydrochlorothiazide; Penicillins; Ciprofloxacin; Doxycycline; Hydrocodone-acetaminophen; Iodine-131; Latex; Statins; Carbamazepine; Codeine; Sulfamethoxazole; and Sulfonamide derivatives   Review of Systems Review of Systems  Constitutional: Negative for appetite change, diaphoresis, fatigue, fever and unexpected weight change.  HENT: Negative for mouth sores.   Eyes: Negative for visual  disturbance.  Respiratory: Negative for cough, chest tightness, shortness of breath and wheezing.   Cardiovascular: Negative for chest pain.  Gastrointestinal: Positive for abdominal pain ( resolved), diarrhea, nausea and vomiting. Negative for constipation.  Endocrine: Negative for polydipsia, polyphagia and polyuria.  Genitourinary: Negative for dysuria, frequency, hematuria and urgency.  Musculoskeletal: Positive for neck pain. Negative for back pain and neck stiffness.  Skin: Negative for rash.  Allergic/Immunologic: Negative for immunocompromised state.  Neurological: Negative for syncope, light-headedness and headaches.  Hematological: Does not bruise/bleed easily.  Psychiatric/Behavioral: Negative for sleep disturbance.     Physical Exam Updated Vital Signs BP (!) 176/110 (BP Location: Right Arm)   Pulse (!) 110   Temp 98.7 F (37.1 C) (Oral)   Resp 18   Ht 5\' 4"  (1.626 m)  Wt 77.6 kg   SpO2 93%   BMI 29.35 kg/m   Physical Exam Vitals signs and nursing note reviewed.  Constitutional:      General: She is not in acute distress.    Appearance: She is well-developed. She is not diaphoretic.     Comments: Awake, alert, nontoxic appearance  HENT:     Head: Normocephalic and atraumatic.     Mouth/Throat:     Mouth: Mucous membranes are dry.     Pharynx: No oropharyngeal exudate.     Comments: Mucous membranes slightly dry Eyes:     General: No scleral icterus.    Conjunctiva/sclera: Conjunctivae normal.  Neck:     Musculoskeletal: Normal range of motion and neck supple.  Cardiovascular:     Rate and Rhythm: Tachycardia present. Rhythm regularly irregular.     Pulses:          Radial pulses are 2+ on the right side and 2+ on the left side.       Dorsalis pedis pulses are 2+ on the right side and 2+ on the left side.  Pulmonary:     Effort: Pulmonary effort is normal. No respiratory distress.     Breath sounds: Normal breath sounds. No wheezing.  Abdominal:      General: Bowel sounds are normal.     Palpations: Abdomen is soft. There is no mass.     Tenderness: There is no abdominal tenderness. There is no guarding or rebound.     Comments: Soft and nontender  Musculoskeletal: Normal range of motion.  Skin:    General: Skin is warm and dry.  Neurological:     Mental Status: She is alert.     Comments: Speech is clear and goal oriented Moves extremities without ataxia Strength 5/5 in the BUE and BLE Sensation intact to normal touch in BUE and BLE  Psychiatric:        Mood and Affect: Mood is anxious.      ED Treatments / Results  Labs (all labs ordered are listed, but only abnormal results are displayed) Labs Reviewed  CBC WITH DIFFERENTIAL/PLATELET - Abnormal; Notable for the following components:      Result Value   Neutro Abs 8.3 (*)    Lymphs Abs 0.6 (*)    All other components within normal limits  COMPREHENSIVE METABOLIC PANEL - Abnormal; Notable for the following components:   Glucose, Bld 165 (*)    GFR calc non Af Amer 56 (*)    All other components within normal limits  URINALYSIS, ROUTINE W REFLEX MICROSCOPIC - Abnormal; Notable for the following components:   Color, Urine STRAW (*)    Glucose, UA 50 (*)    All other components within normal limits  LIPASE, BLOOD  I-STAT TROPONIN, ED    EKG EKG Interpretation  Date/Time:  Friday October 18 2018 22:47:08 EST Ventricular Rate:  107 PR Interval:    QRS Duration: 96 QT Interval:  359 QTC Calculation: 479 R Axis:   -52 Text Interpretation:  Atrial fibrillation Left anterior fascicular block Abnormal R-wave progression, late transition Borderline repolarization abnormality No significant change since last tracing Confirmed by Duffy Bruce [phone]) on 10/18/2018 11:18:31 PM    Procedures Procedures (including critical care time)  Medications Ordered in ED Medications  0.9 %  sodium chloride infusion (has no administration in time range)  sodium chloride 0.9 %  bolus 1,000 mL (1,000 mLs Intravenous New Bag/Given 10/19/18 0020)  ondansetron (ZOFRAN) injection 4 mg (4  mg Intravenous Given 10/19/18 0020)     Initial Impression / Assessment and Plan / ED Course  I have reviewed the triage vital signs and the nursing notes.  Pertinent labs & imaging results that were available during my care of the patient were reviewed by me and considered in my medical decision making (see chart for details).     Patient presents with nausea and vomiting.  On initial exam, mucous membranes were slightly dry.  Patient given fluid bolus and a Zofran.  She has been up ambulating in the hall without difficulty.  No additional episodes of vomiting or diarrhea here in the emergency department.  Lab work is very reassuring.  Initial and repeat abdominal exams have continued to be benign.  She remains alert and oriented.  Her tachycardia has resolved.  At this time patient likely has a viral gastroenteritis.  She will be discharged home with instructions to orally hydrate.  She will be given a prescription for Zofran.  She is to follow with her primary care provider in 24-48 hours.  Discussed reasons to return immediately to the emergency department.  Patient states understanding.  The patient was discussed with and seen by Dr. Vanita Panda who agrees with the treatment plan.   Final Clinical Impressions(s) / ED Diagnoses   Final diagnoses:  Nausea vomiting and diarrhea    ED Discharge Orders         Ordered    ondansetron (ZOFRAN ODT) 4 MG disintegrating tablet     10/19/18 0211           Stevee Valenta, Jarrett Soho, PA-C 10/19/18 5320    Carmin Muskrat, MD 10/19/18 [phone]

## 2018-10-19 ENCOUNTER — Encounter (HOSPITAL_COMMUNITY): Payer: Self-pay

## 2018-10-19 ENCOUNTER — Emergency Department (HOSPITAL_COMMUNITY)
Admission: EM | Admit: 2018-10-19 | Discharge: 2018-10-19 | Disposition: A | Discharge status: Home / Self Care | Payer: Medicare Other | Source: Home / Self Care | Attending: Emergency Medicine | Admitting: Emergency Medicine

## 2018-10-19 DIAGNOSIS — R531 Weakness: Secondary | ICD-10-CM

## 2018-10-19 LAB — CBG MONITORING, ED: Glucose-Capillary: 163 mg/dL — ABNORMAL HIGH (ref 70–99)

## 2018-10-19 MED ORDER — ONDANSETRON 4 MG PO TBDP: ORAL_TABLET | ORAL | 0 refills | Status: DC

## 2018-10-19 NOTE — ED Notes (Signed)
D/c reviewed with patient and son(Chris) over the phone

## 2018-10-19 NOTE — ED Notes (Signed)
Patient is attempting to get out of bed. She is encouraged to stay in bed to avoid falling. Patient states "If I fall, I fall." Patient is assisted to the bathroom and encouraged to stay in the bed. Bed in low position, call bell given.

## 2018-10-19 NOTE — ED Notes (Signed)
Pt is refusing to allow me to assist her in getting dressed. Pt states she will call her atty because we are discharging her in the middle of the night.  I explained to pt that her husband is on the way and pt appeared upset.  She stated se should not discharged her and she will call her atty.   I notified nurse.

## 2018-10-19 NOTE — ED Notes (Signed)
[facility] called @ 1530-per Kehinde, RN-called by Levada Dy

## 2018-10-19 NOTE — Discharge Instructions (Addendum)
1. Medications: zofran, Stop taking doxycycline, usual home medications 2. Treatment: rest, drink plenty of fluids, advance diet slowly 3. Follow Up: Please followup with your primary doctor in 2 days for discussion of your diagnoses and further evaluation after today's visit; if you do not have a primary care doctor use the resource guide provided to find one; Please return to the ER for persistent vomiting, high fevers or worsening symptoms

## 2018-10-19 NOTE — ED Notes (Signed)
Patient is asking to sit on a chair because she states "I'm tired of being in the bed." Patient is here related to dizziness and fall, hence unable to leave patient unattended to sit on a chair.

## 2018-10-19 NOTE — ED Provider Notes (Signed)
[facility] EMERGENCY DEPARTMENT Provider Note   CSN: [id number] Arrival date & time: 10/19/18  9169     History   Chief Complaint Chief Complaint  Patient presents with  . Weakness    HPI Tiffany Black is a 85 y.o. female.  HPI   85 year old female generalized weakness.  She was just recently seen in the emergency room.  She is actually discharged this morning and then represented a few hours later.  She states that she went home and laid in bed.  She was try to get out of bed she slid to the ground.  Denies any injuries from this.  Did not have a syncopal event.  Previous ED work-up was pretty unremarkable.  Just discharged in no distress.  In A. fib which she has a history of the same.  Appropriately anticoagulated.  Apparently patient was upset with her prior discharge and was requesting admission to the hospital.  Past Medical History:  Diagnosis Date  . Anxiety   . Arthritis   . Bulging of cervical intervertebral disc   . Cardiomegaly   . Cataracts, bilateral   . Chronic neck pain   . History of endometrial cancer   . History of stroke    2015 and 2016 // carotid US 8/16: Bilateral 1-39 // carotid US 2/16: Bilateral ICA 1-39  . HLD (hyperlipidemia)   . Hx of echocardiogram    a. Echo (11/15): Mild LVH, EF 55-60%, normal wall motion, grade 2 diastolic dysfunction, mild LAE, normal RV function, PASP 29 mm Hg // echo 8/16:Mild concentric LVH, EF 55-60, normal wall motion, grade 2 diastolic dysfunction, mild LAE, PASP 39  . Hypertension   . Insomnia   . Lung nodule   . OA (osteoarthritis)   . Pectus excavatum   . Persistent atrial fibrillation   . Vertigo     Patient Active Problem List   Diagnosis Date Noted  . Headache syndrome 07/02/2017  . Chest pain 05/23/2015  . Essential hypertension   . History of stroke 05/10/2015  . Dizziness 05/10/2015  . Orthostatic hypotension 05/10/2015  . CVA (cerebral infarction) 05/10/2015  . Hypokalemia  09/30/2013  . Vertigo 02/28/2013  . Sinusitis 02/28/2013  . Anxiety 02/28/2013  . PAC (premature atrial contraction) 04/10/2012  . Abnormal EKG 01/19/2012  . Persistent atrial fibrillation 02/15/2010    Past Surgical History:  Procedure Laterality Date  . ABDOMINAL HYSTERECTOMY  2011  . CATARACT EXTRACTION, BILATERAL Bilateral 2009  . CESAREAN SECTION  1975  . LAPAROSCOPIC HYSTERECTOMY  2011     OB History   No obstetric history on file.      Home Medications    Prior to Admission medications   Medication Sig Start Date End Date Taking? Authorizing Provider  acetaminophen (TYLENOL) 325 MG tablet Take 650 mg by mouth at bedtime as needed for headache (pain).     [provider]  cholecalciferol (VITAMIN D) 1000 UNITS tablet Take 1,000 Units by mouth daily after lunch.     [provider]  doxycycline (VIBRA-TABS) 100 MG tablet Take 100 mg by mouth 2 (two) times daily.    [provider]  felodipine (PLENDIL) 5 MG 24 hr tablet TAKE 1/2 TABLET BY MOUTH DAILY. 02/18/18   Deboraha Sprang, MD  FLUoxetine (PROZAC) 10 MG tablet TAKE 1 TABLET BY MOUTH ONCE DAILY AFTER LUNCH Patient taking differently: Take 10 mg by mouth daily.  11/14/17   Lauree Chandler, NP  KLOR-CON M20 20 MEQ  tablet TAKE 1/2 TABLET BY MOUTH ONCE DAILY Patient taking differently: Take 10 mEq by mouth daily.  10/03/17   Lauree Chandler, NP  labetalol (NORMODYNE) 200 MG tablet Take 500 mg by mouth See admin instructions. Take 1 tablet every morning and take 1 and 1/2 tablets at night    [provider]  meclizine (ANTIVERT) 25 MG tablet Take 1 tablet (25 mg total) by mouth daily as needed for dizziness. Patient not taking: Reported on 10/18/2018 03/19/17   Lauree Chandler, NP  metoprolol tartrate (LOPRESSOR) 50 MG tablet TAKE 1/2 TABLET BY MOUTH TWICE DAILY Patient taking differently: Take 25 mg by mouth 2 (two) times daily.  01/31/18   Deboraha Sprang, MD  ondansetron [facility] ODT)  4 MG disintegrating tablet 4mg  ODT q4 hours prn nausea/vomit 10/19/18   Muthersbaugh, Jarrett Soho, PA-C  rivaroxaban (XARELTO) 20 MG TABS tablet Take 1 tablet (20 mg total) by mouth daily with supper. 09/16/18   Deboraha Sprang, MD  valsartan (DIOVAN) 160 MG tablet Take 0.5 tablets (80 mg total) by mouth 2 (two) times daily. 03/07/18   Deboraha Sprang, MD  zolpidem (AMBIEN) 10 MG tablet Take 1/2 tablet by mouth at bedtime for rest Patient taking differently: Take 5-10 mg by mouth at bedtime.  12/19/17   Lauree Chandler, NP    Family History Family History  Problem Relation Age of Onset  . Dementia Mother   . Heart attack Father   . Heart disease Father   . Stroke Maternal Uncle   . Hypertension Neg Hx     Social History Social History   Tobacco Use  . Smoking status: Never Smoker  . Smokeless tobacco: Never Used  Substance Use Topics  . Alcohol use: No  . Drug use: No     Allergies   Hydrochlorothiazide; Penicillins; Ciprofloxacin; Doxycycline; Hydrocodone-acetaminophen; Iodine-131; Latex; Statins; Carbamazepine; Codeine; Sulfamethoxazole; and Sulfonamide derivatives   Review of Systems Review of Systems All systems reviewed and negative, other than as noted in HPI.   Physical Exam Updated Vital Signs BP (!) 146/71   Pulse 99   Temp 98.2 F (36.8 C) (Oral)   Resp (!) 22   SpO2 92%   Physical Exam Vitals signs and nursing note reviewed.  Constitutional:      General: She is not in acute distress.    Appearance: She is well-developed.  HENT:     Head: Normocephalic and atraumatic.  Eyes:     General:        Right eye: No discharge.        Left eye: No discharge.     Conjunctiva/sclera: Conjunctivae normal.  Neck:     Musculoskeletal: Neck supple.  Cardiovascular:     Rate and Rhythm: Normal rate and regular rhythm.     Heart sounds: Normal heart sounds. No murmur. No friction rub. No gallop.   Pulmonary:     Effort: Pulmonary effort is normal. No respiratory  distress.     Breath sounds: Normal breath sounds.  Abdominal:     General: There is no distension.     Palpations: Abdomen is soft.     Tenderness: There is no abdominal tenderness.  Musculoskeletal:        General: No tenderness.  Skin:    General: Skin is warm and dry.  Neurological:     General: No focal deficit present.     Mental Status: She is alert and oriented to person, place, and time.  Cranial Nerves: No cranial nerve deficit.     Sensory: No sensory deficit.     Motor: No weakness.     Coordination: Coordination normal.  Psychiatric:        Behavior: Behavior normal.        Thought Content: Thought content normal.      ED Treatments / Results  Labs (all labs ordered are listed, but only abnormal results are displayed) Labs Reviewed  CBG MONITORING, ED - Abnormal; Notable for the following components:      Result Value   Glucose-Capillary 163 (*)    All other components within normal limits    EKG None  Radiology No results found.  Procedures Procedures (including critical care time)  Medications Ordered in ED Medications - No data to display   Initial Impression / Assessment and Plan / ED Course  I have reviewed the triage vital signs and the nursing notes.  Pertinent labs & imaging results that were available during my care of the patient were reviewed by me and considered in my medical decision making (see chart for details).     85 year old female with some generalized weakness.  Neuro exam is nonfocal.  She was just recently seen in the ED and returned a few hours after discharge.  She states that she slid out of bed if she is too weak.  She has been ambulating the emergency room independently without any apparent difficulty.  She is hemodynamically stable.  I do not feel that she needed repeat blood work given the short interval since her last labs.  Encouragement was provided.  There does not appear to be an acute emergent condition here.   She is to follow-up with her PCP with regards to additional resources or placement if she feels like she needs it.  Final Clinical Impressions(s) / ED Diagnoses   Final diagnoses:  Generalized weakness    ED Discharge Orders    None       Virgel Manifold, MD 10/24/18 [phone]

## 2018-10-19 NOTE — ED Notes (Signed)
Pt ambulated around nurses station BY HER SELF with NO assistance needed. Pt stated, "I wonder if ill ever be able to walk again" as she was ambulating by herself around the nurses station. Steady gait while ambulating. No SOB was noticed during ambulation.

## 2018-10-19 NOTE — ED Triage Notes (Signed)
Pt arrived via [facility]; pt from hm with c/o generalized wkness; pt went to MD visit yesterday where she rec'd antibiotics (doxy) and stated it made her feel bad so she came to ER and was dishcarged approx 2am. Pt then got home and later this am attempted to get out of bed but was dizzy and weak and slid to the floor. 146/93; Afib 90-120, 92-94% on RA; CBG 235

## 2018-10-19 NOTE — ED Notes (Signed)
Patient is asking to get admitted. She states she cannot go home because her son cannot pick her up till 11pm. Patient is advised that we can get her a mode of transportation. She states she want to get admitted and that she will return to the ED as soon as she gets home Patient's son Gerald Stabs [phone]) was called by this RN to notifiy. He states to call [facility] and that she has a key to get [place] the [place]ou[place] and he would meet her at home

## 2018-10-19 NOTE — ED Notes (Signed)
[facility] arrived to transport patient. 

## 2018-10-19 NOTE — ED Notes (Addendum)
Spoke with patient and son.  Patient stated she wanted to be admitted to hospital.  It was explained to patient by this RN that there was not any criteria for her to be admitted to hospital for.  Patient states that she is sick and she will call her attorney because "we are pushing her out the door."  She states that she is having some high blood pressure and she needs to be admitted for that.  Informed patient that she is not having any high blood pressure that needs to be admitted at this time.  She states that she will inform her lawyer of [place] not admitting her and "kicking her out of the ED".  It was explained for a third time that there is nothing that the providers at the ED can admit her for.  She stated that she will not leave and that we ([facility]) would have to find a way home in the morning.  It was explained by this RN that we do not give [employer] and if she wants to go home via ambulance, it would be at her own cost.  Patient stated to me one more time that she was not going home.  I told the patient and her son that she will then be placed in our progression bed in the hallway until she decides she is going home or has a ride home.

## 2018-10-21 ENCOUNTER — Telehealth: Payer: Self-pay | Admitting: Internal Medicine

## 2018-10-21 NOTE — Telephone Encounter (Signed)
New message   Pt c/o BP issue: STAT if pt c/o blurred vision, one-sided weakness or slurred speech  1. What are your last 5 BP readings? 121/83 HR 89  2. Are you having any other symptoms (ex. Dizziness, headache, blurred vision, passed out)? No  3. What is your BP issue? PT is weak, she has been in bed for 2 days  PT was in ER on 02/08  She is not sure what medications to take or not to take  She doesn't know if she should stop taking them   Please call

## 2018-10-21 NOTE — Telephone Encounter (Signed)
Pt called back. She states she has had a GI upset for two days. She states her SBP's have been in the 117-125 range, which she thought was too low. I advised her this WNL, but if her SBP < 110, to hold her valsartan until she has recovered. I encouraged her to replace her fluids and sodium and to follow up with her PCP. She has verbalized understanding and had no additional questions.

## 2018-10-21 NOTE — Telephone Encounter (Signed)
Attempted to call pt. There was no answer and her voicemail is not set up to leave messages at this time. Will attempt again later.

## 2018-11-06 ENCOUNTER — Encounter: Payer: Self-pay | Admitting: Internal Medicine

## 2018-11-06 ENCOUNTER — Ambulatory Visit: Payer: Medicare Other | Admitting: Internal Medicine

## 2018-11-06 VITALS — BP 136/98 | HR 81 | Ht 64.0 in | Wt 171.8 lb

## 2018-11-06 DIAGNOSIS — I4821 Permanent atrial fibrillation: Secondary | ICD-10-CM

## 2018-11-06 NOTE — Progress Notes (Signed)
T       Patient Care Team: Marton Redwood, MD as PCP - General (Internal Medicine) Deboraha Sprang, MD as Consulting Physician (Cardiology) Shon Hough, MD as Consulting Physician (Ophthalmology) Sanjuana Mae, MD as Referring Physician (Vascular Surgery) Sharmon Revere as Physician Assistant (Cardiology)   HPI  Tiffany Black is a 85 y.o. female Seen in followup for atrial fibrillation now permanent   TIA  On anticoagulation w Rivaroxaban   Echo 8/16 demonstrated normal LV function and mild LVH and left atrial enlargement Carotid < 40% bilateral stenosis 7/18 Echo normal LV function with LVH   Date Cr K Hgb Mg  4/18  0.81 4.2     10/18  1.03(GFR 48)   4.0 13.5      2/20 0.94 4.0 13.2    NO significant interval arrhythmias.  No bleeding.  Mild dyspnea on exertion but without peripheral edema.  No nocturnal dyspnea orthopnea.      Past Medical History:  Diagnosis Date  . Anxiety   . Arthritis   . Bulging of cervical intervertebral disc   . Cardiomegaly   . Cataracts, bilateral   . Chronic neck pain   . History of endometrial cancer   . History of stroke    2015 and 2016 // carotid US 8/16: Bilateral 1-39 // carotid US 2/16: Bilateral ICA 1-39  . HLD (hyperlipidemia)   . Hx of echocardiogram    a. Echo (11/15): Mild LVH, EF 55-60%, normal wall motion, grade 2 diastolic dysfunction, mild LAE, normal RV function, PASP 29 mm Hg // echo 8/16:Mild concentric LVH, EF 55-60, normal wall motion, grade 2 diastolic dysfunction, mild LAE, PASP 39  . Hypertension   . Insomnia   . Lung nodule   . OA (osteoarthritis)   . Pectus excavatum   . Persistent atrial fibrillation   . Vertigo     Past Surgical History:  Procedure Laterality Date  . ABDOMINAL HYSTERECTOMY  2011  . CATARACT EXTRACTION, BILATERAL Bilateral 2009  . CESAREAN SECTION  1975  . LAPAROSCOPIC HYSTERECTOMY  2011    Current Outpatient Medications  Medication Sig Dispense Refill  .  acetaminophen (TYLENOL) 325 MG tablet Take 650 mg by mouth at bedtime as needed for headache (pain).     . cholecalciferol (VITAMIN D) 1000 UNITS tablet Take 1,000 Units by mouth daily after lunch.     . felodipine (PLENDIL) 5 MG 24 hr tablet TAKE 1/2 TABLET BY MOUTH DAILY. 15 tablet 9  . FLUoxetine (PROZAC) 10 MG tablet TAKE 1 TABLET BY MOUTH ONCE DAILY AFTER LUNCH 90 tablet 1  . KLOR-CON M20 20 MEQ tablet TAKE 1/2 TABLET BY MOUTH ONCE DAILY 15 tablet 6  . labetalol (NORMODYNE) 200 MG tablet Take 200-300 mg by mouth See admin instructions. Take 1 tablet every morning and take 1 and 1/2 tablets at night    . meclizine (ANTIVERT) 25 MG tablet Take 1 tablet (25 mg total) by mouth daily as needed for dizziness. 30 tablet 0  . metoprolol tartrate (LOPRESSOR) 50 MG tablet TAKE 1/2 TABLET BY MOUTH TWICE DAILY 90 tablet 3  . rivaroxaban (XARELTO) 20 MG TABS tablet Take 1 tablet (20 mg total) by mouth daily with supper. 30 tablet 8  . valsartan (DIOVAN) 160 MG tablet Take 0.5 tablets (80 mg total) by mouth 2 (two) times daily. 30 tablet 11  . zolpidem (AMBIEN) 10 MG tablet Take 1/2 tablet by mouth at bedtime for rest 15 tablet 0  No current facility-administered medications for this visit.     Allergies  Allergen Reactions  . Hydrochlorothiazide Other (See Comments) and Anaphylaxis    Unknown allergic reaction  . Penicillins Hives and Anaphylaxis    Has patient had a PCN reaction causing immediate rash, facial/tongue/throat swelling, SOB or lightheadedness with hypotension: Yes Has patient had a PCN reaction causing severe rash involving mucus membranes or skin necrosis: No Has patient had a PCN reaction that required hospitalization No Has patient had a PCN reaction occurring within the last 10 years: No If all of the above answers are "NO", then may proceed with Cephalosporin use.   . Ciprofloxacin Other (See Comments)    Blood pressure issues  . Doxycycline Diarrhea and Nausea And Vomiting    . Hydrocodone-Acetaminophen Other (See Comments)    unknown  . Iodine-131 Other (See Comments)    Raises blood pressure  . Latex Itching  . Statins     Muscle weakness  . Carbamazepine Other (See Comments) and Rash    Flushed blood pressure medication out of system  . Codeine Nausea And Vomiting and Nausea Only    Nausea and vomiting  . Sulfamethoxazole Rash  . Sulfonamide Derivatives Hives    Hives     Review of Systems negative except from HPI and PMH  Physical Exam BP (!) 136/98   Pulse 81   Ht 5\' 4"  (1.626 m)   Wt 171 lb 12.8 oz (77.9 kg)   SpO2 97%   BMI 29.49 kg/m  Well developed and nourished in no acute distress HENT normal Neck supple with JVP-flat Carotids brisk and full without bruits Clear Irregularly irregular rate and rhythm with controlled ventricular response, no murmurs or gallops Abd-soft with active BS without hepatomegaly No Clubbing cyanosis edema Skin-warm and dry A & Oriented  Grossly normal sensory and motor function    ECG atrial fibrillation at 81 Interval-/zero 9/38 Axis left -60   Assessment and  Plan  Hypertension  Atrial fibrillation - permanent  TIA  Renal insufficiency grade 3  Atrial fibrillation permanent  Scant palpitations  On Anticoagulation;  No bleeding issues   BP well controlled  Labs within range

## 2018-11-06 NOTE — Patient Instructions (Signed)

## 2019-01-01 ENCOUNTER — Other Ambulatory Visit: Payer: Self-pay | Admitting: Internal Medicine

## 2019-01-01 MED ORDER — FELODIPINE ER 5 MG PO TB24: ORAL_TABLET | ORAL | 11 refills | Status: DC

## 2019-01-27 ENCOUNTER — Other Ambulatory Visit: Payer: Self-pay | Admitting: Nurse Practitioner

## 2019-01-27 ENCOUNTER — Other Ambulatory Visit: Payer: Self-pay

## 2019-01-27 ENCOUNTER — Telehealth (INDEPENDENT_AMBULATORY_CARE_PROVIDER_SITE_OTHER): Payer: Medicare Other | Admitting: Internal Medicine

## 2019-01-27 ENCOUNTER — Other Ambulatory Visit: Payer: Self-pay | Admitting: Internal Medicine

## 2019-01-27 ENCOUNTER — Telehealth: Payer: Self-pay

## 2019-01-27 ENCOUNTER — Encounter: Payer: Self-pay | Admitting: Internal Medicine

## 2019-01-27 VITALS — Ht 64.0 in | Wt 171.0 lb

## 2019-01-27 DIAGNOSIS — I10 Essential (primary) hypertension: Secondary | ICD-10-CM

## 2019-01-27 DIAGNOSIS — F419 Anxiety disorder, unspecified: Secondary | ICD-10-CM

## 2019-01-27 DIAGNOSIS — I4821 Permanent atrial fibrillation: Secondary | ICD-10-CM

## 2019-01-27 DIAGNOSIS — Z8673 Personal history of transient ischemic attack (TIA), and cerebral infarction without residual deficits: Secondary | ICD-10-CM

## 2019-01-27 DIAGNOSIS — I951 Orthostatic hypotension: Secondary | ICD-10-CM

## 2019-01-27 MED ORDER — FELODIPINE ER 5 MG PO TB24: 5.0000 mg | ORAL_TABLET | Freq: Every day | ORAL | 3 refills | Status: DC

## 2019-01-27 MED ORDER — LABETALOL HCL 200 MG PO TABS: ORAL_TABLET | ORAL | 3 refills | Status: DC

## 2019-01-27 MED ORDER — FLUOXETINE HCL 10 MG PO TABS: 10.0000 mg | ORAL_TABLET | ORAL | 1 refills | Status: DC

## 2019-01-27 MED ORDER — VALSARTAN 160 MG PO TABS: 80.0000 mg | ORAL_TABLET | Freq: Two times a day (BID) | ORAL | 3 refills | Status: DC

## 2019-01-27 NOTE — Progress Notes (Signed)
Electrophysiology TeleHealth Note   Due to national recommendations of social distancing due to COVID 19, an audio/video telehealth visit is felt to be most appropriate for this patient at this time.  See MyChart message from today for the patient's consent to telehealth for [facility].   Date:  01/27/2019   ID:  Tiffany Black, DOB 27-Nov-1933, MRN [id number]  Location: patient's home  Provider location: [address] [place]  Evaluation Performed: Follow-up visit  PCP:  Marton Redwood, MD  Cardiologist:     Electrophysiologist:  SK   Chief Complaint:  Hypertension and atrial fib  History of Present Illness:    Tiffany Black is a 85 y.o. female who presents via audio/video conferencing for a telehealth visit today.  Since last being seen in our clinic for atrial fibrillation permanent  the patient reports doing exceptionally well  The patient denies chest pain, shortness of breath, nocturnal dyspnea, orthopnea or peripheral edema.  There have been no palpitations, lightheadedness or syncope.    On Anticoagulation;  No bleeding issues   Date Cr K Hgb  2/20 0.94 4.0 13.2           BP remains elevated 150/110 is an average number; frustrated with distracted MD at [place] is variable   The patient denies symptoms of fevers, chills, cough, or new SOB worrisome for COVID 19.   Past Medical History:  Diagnosis Date  . Anxiety   . Arthritis   . Bulging of cervical intervertebral disc   . Cardiomegaly   . Cataracts, bilateral   . Chronic neck pain   . History of endometrial cancer   . History of stroke    2015 and 2016 // carotid US 8/16: Bilateral 1-39 // carotid US 2/16: Bilateral ICA 1-39  . HLD (hyperlipidemia)   . Hx of echocardiogram    a. Echo (11/15): Mild LVH, EF 55-60%, normal wall motion, grade 2 diastolic dysfunction, mild LAE, normal RV function, PASP 29 mm Hg // echo 8/16:Mild concentric LVH, EF 55-60, normal wall motion, grade 2  diastolic dysfunction, mild LAE, PASP 39  . Hypertension   . Insomnia   . Lung nodule   . OA (osteoarthritis)   . Pectus excavatum   . Persistent atrial fibrillation   . Vertigo     Past Surgical History:  Procedure Laterality Date  . ABDOMINAL HYSTERECTOMY  2011  . CATARACT EXTRACTION, BILATERAL Bilateral 2009  . CESAREAN SECTION  1975  . LAPAROSCOPIC HYSTERECTOMY  2011    Current Outpatient Medications  Medication Sig Dispense Refill  . acetaminophen (TYLENOL) 325 MG tablet Take 650 mg by mouth at bedtime as needed for headache (pain).     . cholecalciferol (VITAMIN D) 1000 UNITS tablet Take 1,000 Units by mouth daily after lunch.     . felodipine (PLENDIL) 5 MG 24 hr tablet TAKE 1/2 TABLET BY MOUTH DAILY. 15 tablet 11  . FLUoxetine (PROZAC) 10 MG tablet TAKE 1 TABLET BY MOUTH ONCE DAILY AFTER LUNCH 90 tablet 1  . KLOR-CON M20 20 MEQ tablet TAKE 1/2 TABLET BY MOUTH ONCE DAILY 15 tablet 6  . labetalol (NORMODYNE) 200 MG tablet Take 1 tablet every morning and take 1 and 1/2 tablets at night 225 tablet 3  . meclizine (ANTIVERT) 25 MG tablet Take 1 tablet (25 mg total) by mouth daily as needed for dizziness. 30 tablet 0  . metoprolol tartrate (LOPRESSOR) 50 MG tablet TAKE 1/2 TABLET BY MOUTH TWICE  DAILY 90 tablet 3  . rivaroxaban (XARELTO) 20 MG TABS tablet Take 1 tablet (20 mg total) by mouth daily with supper. 30 tablet 8  . valsartan (DIOVAN) 160 MG tablet Take 0.5 tablets (80 mg total) by mouth 2 (two) times daily. 30 tablet 11  . zolpidem (AMBIEN) 10 MG tablet Take 1/2 tablet by mouth at bedtime for rest 15 tablet 0   No current facility-administered medications for this visit.     Allergies:   Hydrochlorothiazide; Penicillins; Ciprofloxacin; Doxycycline; Hydrocodone-acetaminophen; Iodine-131; Latex; Statins; Carbamazepine; Codeine; Sulfamethoxazole; and Sulfonamide derivatives   Social History:  The patient  reports that she has never smoked. She has never used smokeless  tobacco. She reports that she does not drink alcohol or use drugs.   Family History:  The patient's   family history includes Dementia in her mother; Heart attack in her father; Heart disease in her father; Stroke in her maternal uncle.   ROS:  Please see the history of present illness.   All other systems are personally reviewed and negative.    Exam:    Vital Signs:  Ht 5\' 4"  (1.626 m)   Wt 171 lb (77.6 kg)   BMI 29.35 kg/m     Labs/Other Tests and Data Reviewed:    Recent Labs: 10/18/2018: ALT 16; BUN 17; Creatinine, Ser 0.94; Hemoglobin 13.2; Platelets 162; Potassium 4.0; Sodium 140   Wt Readings from Last 3 Encounters:  01/27/19 171 lb (77.6 kg)  11/06/18 171 lb 12.8 oz (77.9 kg)  10/18/18 171 lb (77.6 kg)     Other studies personally reviewed: Additional studies/ records that were reviewed today include: As above     ASSESSMENT & PLAN:    Hypertension  Atrial fibrillation - permanent  TIA  Renal insufficiency grade 3  Anxiety  Orthostatic hypotension    BP remains elevated-- discussed doubling medications in sequence, first felodipine-- if succsessful continue at 5 mg/day if not return to 2.5 mg and thendouble diovan to 160 bid  The third option would be to double the metoprolol  OI might also intrude and we may have to tolerate a higher systolic BP to avoid symptomatic orthostatic lightheadedness   Explained that in the context of atrial fibrillation HR would expected to be variable so would ignore   Also desires to come off prozac  I have suggested she decrease the dose to 10 mg qod and I will let Dr Brigitte Pulse know aobut the above  On Anticoagulation;  No bleeding issues Hgb stable    COVID 19 screen The patient denies symptoms of COVID 19 at this time.  The importance of social distancing was discussed today.  Follow-up:  6 m   Current medicines are reviewed at length with the patient today.   The patient has concerns regarding her medicines.   The following changes were made today:      Labs/ tests ordered today include  No orders of the defined types were placed in this encounter.    Patient Risk:  after full review of this patients clinical status, I feel that they are at moderate  risk at this time.  Today, I have spent  17 minutes with the patient with telehealth technology discussing the above.  Signed, Virl Axe, MD  01/27/2019 10:58 AM     [address] [place] [address] [phone] (office) [phone] (fax)

## 2019-01-27 NOTE — Telephone Encounter (Addendum)
Letter received from [employer] stating that they have denied the pts Xarelto tier exception. Reason:  Xarelto is a brand name drug and cannot be lowered any further per Medicare Part D rules.

## 2019-01-27 NOTE — Addendum Note (Signed)
Addended by: Dollene Primrose on: 01/27/2019 05:01 PM   Modules accepted: Orders

## 2019-01-27 NOTE — Patient Instructions (Signed)
Called pt and discussed the following recommendations. Pt understands if she has any additional questions, call the office to discuss before changing medications on her own.  Medication Instructions:  Your physician has recommended you make the following change in your medication:   Decrease your Prozac to 10mg  every other day; be sure to call and discuss this change with Dr. Brigitte Pulse  Increase your Felodipine to 5mg , one whole tablet, once daily. Please monitor your blood pressure for the next few days to see if it helps to reduce it. If not, please call the office for additional recommendation.   Labwork: None ordered.   Testing/Procedures: None ordered.   Follow-Up: Your physician recommends that you schedule a follow-up appointment in: 6 months with Dr Caryl Comes  Any Other Special Instructions Will Be Listed Below (If Applicable).     If you need a refill on your cardiac medications before your next appointment, please call your pharmacy.

## 2019-01-28 NOTE — Telephone Encounter (Signed)
Pt was already aware of the denial. She will continue with Xarelto despite the denial.

## 2019-01-29 ENCOUNTER — Telehealth: Payer: Medicare Other | Admitting: Internal Medicine

## 2019-01-31 ENCOUNTER — Other Ambulatory Visit: Payer: Self-pay | Admitting: Nurse Practitioner

## 2019-02-05 ENCOUNTER — Telehealth: Payer: Self-pay | Admitting: Internal Medicine

## 2019-02-05 NOTE — Telephone Encounter (Signed)
  Patient is concerned she may be having an episode of afib. She is feeling a fluttering last night and today. Pt denies any other symptoms except for a headache. She is concerned the medication is not working.

## 2019-02-05 NOTE — Telephone Encounter (Signed)
Pt calling today with c/o palpitations. She states her BP remains elevated in the 150's with HR's in the 70's. Pt understands she has permanent AF but the palpitations have been annoying her for the last couple of days.  I advised pt to take an additional dose of her metoprolol, half tablet 25mg , as needed for palpitations once daily. I told her I will forward this to Dr Caryl Comes for further review given her BP has remained elevated despite medication changes.   She verbalized understanding and had no additional questions.

## 2019-02-05 NOTE — Telephone Encounter (Signed)
Agree Would have her increase metoprolol 25 bid and also we should ask why-- last TSH was 2018 so lets repeat plz Lorren Thx SK

## 2019-02-07 NOTE — Telephone Encounter (Signed)
She is currently taking 25mg  bid. Did you want to increase to 50mg  bid?

## 2019-02-10 MED ORDER — METOPROLOL TARTRATE 50 MG PO TABS: 50.0000 mg | ORAL_TABLET | Freq: Two times a day (BID) | ORAL | 3 refills | Status: DC

## 2019-02-10 NOTE — Telephone Encounter (Signed)
Pt agrees to increase her metoprolol to 50mg  bid. She will have her TSH drawn by her PCP next week.

## 2019-02-10 NOTE — Telephone Encounter (Signed)
Thanks for clarifying  Lets increase metoprolol tartrate to 50 bid

## 2019-02-27 MED ORDER — VALSARTAN 160 MG PO TABS: 80.0000 mg | ORAL_TABLET | Freq: Two times a day (BID) | ORAL | 3 refills | Status: DC

## 2019-02-27 NOTE — Addendum Note (Signed)
Addended by: Derl Barrow on: 02/27/2019 02:08 PM   Modules accepted: Orders

## 2019-03-12 ENCOUNTER — Other Ambulatory Visit: Payer: Self-pay | Admitting: Internal Medicine

## 2019-03-13 ENCOUNTER — Other Ambulatory Visit: Payer: Self-pay | Admitting: Internal Medicine

## 2019-03-23 ENCOUNTER — Other Ambulatory Visit: Payer: Self-pay | Admitting: Internal Medicine

## 2019-03-26 ENCOUNTER — Other Ambulatory Visit: Payer: Self-pay | Admitting: Internal Medicine

## 2019-03-26 MED ORDER — METOPROLOL TARTRATE 50 MG PO TABS: 50.0000 mg | ORAL_TABLET | Freq: Two times a day (BID) | ORAL | 3 refills | Status: DC

## 2019-03-26 NOTE — Telephone Encounter (Signed)
Pt calling stating that [facility] and her medication that she picked up is a different color, size and shape because they used a different manufacturer and she does not know if this medication is going to agreed with her. I informed the pt that I could resend her medication to [facility] as she requested and for her to call back if the medication does not work for her. Medication sent to pt's pharmacy as requested. Confirmation received.  Pt verbalized understanding.

## 2019-03-28 ENCOUNTER — Telehealth: Payer: Self-pay | Admitting: Internal Medicine

## 2019-03-28 NOTE — Telephone Encounter (Signed)
°  Pt c/o medication issue:  1. Name of Medication: metoprolol tartrate (LOPRESSOR) 50 MG tablet  2. How are you currently taking this medication (dosage and times per day)? 50 mg 2x daily  3. Are you having a reaction (difficulty breathing--STAT)? no  4. What is your medication issue? Stiffness , dizziness, unable to walk  Patient states that the new tablet that she got from the pharmacy is causing stiffness and dizziness.  The manufacturer is different, She has been on the medication for a while and not had this issue. She had been getting her medication from [place], but [facility] so she could pick up the medication in the drive through.  She would like to go back on the other brand of the medicine

## 2019-03-28 NOTE — Telephone Encounter (Signed)
Spoke with the patient, her Lopressor changed manufacturer and she said since she took the medication, she has felt dizziness so much that she is afraid to walk. Her BP: 153/103 HR: 83. She stated that [facility] is the place she got the medication. She has never had this happen before. The only thing that changed is the manufacturer.

## 2019-03-28 NOTE — Telephone Encounter (Signed)
LVM for return call. 

## 2019-04-24 ENCOUNTER — Telehealth: Payer: Self-pay | Admitting: Internal Medicine

## 2019-04-24 NOTE — Telephone Encounter (Signed)
Pt calling with complaints of a burning sensation below her stomach area. This happens every morning and she wonders what it could be. She denies n/v or dark stools. I advised her to call her PCP regarding these symptoms as they are probably not cardiac related.   She also thinks she needs a TSH drawn. I reviewed her last OV notes. There is no indication she needs a TSH ordered.   She has verbalized understanding and had no additional questions.

## 2019-04-24 NOTE — Telephone Encounter (Signed)
New Message    Patient states whenever she lays down she wakes up with a burning sensation in her abdomin area and would like to speak to a nurse about the issue.  Also patient states she's suppose to have a TSH lab test done and wants to know what is it for.

## 2019-04-29 ENCOUNTER — Other Ambulatory Visit: Payer: Self-pay

## 2019-04-29 ENCOUNTER — Emergency Department (HOSPITAL_COMMUNITY)
Admission: EM | Admit: 2019-04-29 | Discharge: 2019-04-29 | Disposition: A | Discharge status: Home / Self Care | Payer: Medicare Other | Attending: Emergency Medicine | Admitting: Emergency Medicine

## 2019-04-29 ENCOUNTER — Encounter (HOSPITAL_COMMUNITY): Payer: Self-pay

## 2019-04-29 ENCOUNTER — Emergency Department (HOSPITAL_COMMUNITY): Payer: Medicare Other

## 2019-04-29 DIAGNOSIS — S52572A Other intraarticular fracture of lower end of left radius, initial encounter for closed fracture: Secondary | ICD-10-CM | POA: Diagnosis not present

## 2019-04-29 DIAGNOSIS — Z7901 Long term (current) use of anticoagulants: Secondary | ICD-10-CM | POA: Diagnosis not present

## 2019-04-29 DIAGNOSIS — Z79899 Other long term (current) drug therapy: Secondary | ICD-10-CM | POA: Insufficient documentation

## 2019-04-29 DIAGNOSIS — Y999 Unspecified external cause status: Secondary | ICD-10-CM | POA: Insufficient documentation

## 2019-04-29 DIAGNOSIS — W010XXA Fall on same level from slipping, tripping and stumbling without subsequent striking against object, initial encounter: Secondary | ICD-10-CM | POA: Insufficient documentation

## 2019-04-29 DIAGNOSIS — I10 Essential (primary) hypertension: Secondary | ICD-10-CM | POA: Insufficient documentation

## 2019-04-29 DIAGNOSIS — S52502A Unspecified fracture of the lower end of left radius, initial encounter for closed fracture: Secondary | ICD-10-CM

## 2019-04-29 DIAGNOSIS — Y92007 Garden or yard of unspecified non-institutional (private) residence as the place of occurrence of the external cause: Secondary | ICD-10-CM | POA: Diagnosis not present

## 2019-04-29 DIAGNOSIS — Y93H2 Activity, gardening and landscaping: Secondary | ICD-10-CM | POA: Insufficient documentation

## 2019-04-29 DIAGNOSIS — Z91040 Latex allergy status: Secondary | ICD-10-CM | POA: Insufficient documentation

## 2019-04-29 DIAGNOSIS — S6992XA Unspecified injury of left wrist, hand and finger(s), initial encounter: Secondary | ICD-10-CM | POA: Diagnosis present

## 2019-04-29 MED ORDER — OXYCODONE-ACETAMINOPHEN 5-325 MG PO TABS
2.0000 | ORAL_TABLET | Freq: Once | ORAL | Status: AC
  Administered 2019-04-29: 2 via ORAL
  Filled 2019-04-29: qty 2

## 2019-04-29 MED ORDER — OXYCODONE-ACETAMINOPHEN 5-325 MG PO TABS: 1.0000 | ORAL_TABLET | Freq: Four times a day (QID) | ORAL | 0 refills | Status: DC | PRN

## 2019-04-29 NOTE — ED Notes (Signed)
Patient transported to X-ray 

## 2019-04-29 NOTE — ED Provider Notes (Signed)
[facility] EMERGENCY DEPARTMENT Provider Note   CSN: [id number] Arrival date & time: 04/29/19  1242     History   Chief Complaint Chief Complaint  Patient presents with  . Arm Injury    HPI Tiffany Black is a 85 y.o. female.     Patient is an 85 year old female with past medical history of prior CVA, cardiomegaly, hypertension, and atrial fibrillation for which she is on Xarelto.  She presents today for evaluation of a left wrist injury.  Patient was cleaning up leaves in the yard when she fell and injured her left wrist.  She denies other injury.  Pain is worse with movement and palpation.  There are no alleviating factors.  The history is provided by the patient.  Arm Injury Location:  Wrist Wrist location:  L wrist Injury: yes   Mechanism of injury: fall   Pain details:    Quality:  Throbbing   Radiates to:  Does not radiate   Severity:  Moderate   Onset quality:  Sudden   Timing:  Constant   Progression:  Worsening   Past Medical History:  Diagnosis Date  . Anxiety   . Arthritis   . Bulging of cervical intervertebral disc   . Cardiomegaly   . Cataracts, bilateral   . Chronic neck pain   . History of endometrial cancer   . History of stroke    2015 and 2016 // carotid US 8/16: Bilateral 1-39 // carotid US 2/16: Bilateral ICA 1-39  . HLD (hyperlipidemia)   . Hx of echocardiogram    a. Echo (11/15): Mild LVH, EF 55-60%, normal wall motion, grade 2 diastolic dysfunction, mild LAE, normal RV function, PASP 29 mm Hg // echo 8/16:Mild concentric LVH, EF 55-60, normal wall motion, grade 2 diastolic dysfunction, mild LAE, PASP 39  . Hypertension   . Insomnia   . Lung nodule   . OA (osteoarthritis)   . Pectus excavatum   . Persistent atrial fibrillation   . Vertigo     Patient Active Problem List   Diagnosis Date Noted  . Headache syndrome 07/02/2017  . Chest pain 05/23/2015  . Essential hypertension   . History of stroke 05/10/2015  .  Dizziness 05/10/2015  . Orthostatic hypotension 05/10/2015  . CVA (cerebral infarction) 05/10/2015  . Hypokalemia 09/30/2013  . Vertigo 02/28/2013  . Sinusitis 02/28/2013  . Anxiety 02/28/2013  . PAC (premature atrial contraction) 04/10/2012  . Abnormal EKG 01/19/2012  . Persistent atrial fibrillation 02/15/2010    Past Surgical History:  Procedure Laterality Date  . ABDOMINAL HYSTERECTOMY  2011  . CATARACT EXTRACTION, BILATERAL Bilateral 2009  . CESAREAN SECTION  1975  . LAPAROSCOPIC HYSTERECTOMY  2011     OB History   No obstetric history on file.      Home Medications    Prior to Admission medications   Medication Sig Start Date End Date Taking? Authorizing Provider  acetaminophen (TYLENOL) 325 MG tablet Take 650 mg by mouth at bedtime as needed for headache (pain).     [provider]  cholecalciferol (VITAMIN D) 1000 UNITS tablet Take 1,000 Units by mouth daily after lunch.     [provider]  felodipine (PLENDIL) 5 MG 24 hr tablet Take 1 tablet (5 mg total) by mouth daily. 01/27/19   Deboraha Sprang, MD  FLUoxetine (PROZAC) 10 MG tablet Take 1 tablet (10 mg total) by mouth every other day. 01/27/19   Deboraha Sprang, MD  KLOR-CON M20  20 MEQ tablet TAKE 1/2 TABLET BY MOUTH ONCE DAILY 10/03/17   Lauree Chandler, NP  labetalol (NORMODYNE) 200 MG tablet Take 1 tablet every morning and take 1 and 1/2 tablets at night 01/27/19   Deboraha Sprang, MD  meclizine (ANTIVERT) 25 MG tablet Take 1 tablet (25 mg total) by mouth daily as needed for dizziness. 03/19/17   Lauree Chandler, NP  metoprolol tartrate (LOPRESSOR) 50 MG tablet Take 1 tablet (50 mg total) by mouth 2 (two) times daily. 03/26/19   Deboraha Sprang, MD  rivaroxaban (XARELTO) 20 MG TABS tablet Take 1 tablet (20 mg total) by mouth daily with supper. 09/16/18   Deboraha Sprang, MD  valsartan (DIOVAN) 160 MG tablet Take 0.5 tablets (80 mg total) by mouth 2 (two) times daily. 02/27/19   Deboraha Sprang, MD   zolpidem Lorrin Mais) 10 MG tablet Take 1/2 tablet by mouth at bedtime for rest 12/19/17   Lauree Chandler, NP    Family History Family History  Problem Relation Age of Onset  . Dementia Mother   . Heart attack Father   . Heart disease Father   . Stroke Maternal Uncle   . Hypertension Neg Hx     Social History Social History   Tobacco Use  . Smoking status: Never Smoker  . Smokeless tobacco: Never Used  Substance Use Topics  . Alcohol use: No  . Drug use: No     Allergies   Hydrochlorothiazide, Penicillins, Ciprofloxacin, Doxycycline, Hydrocodone-acetaminophen, Iodine-131, Latex, Statins, Carbamazepine, Codeine, Sulfamethoxazole, and Sulfonamide derivatives   Review of Systems Review of Systems  All other systems reviewed and are negative.    Physical Exam Updated Vital Signs BP (!) 162/86 (BP Location: Right Arm)   Pulse 83 Comment: Simultaneous filing. User may not have seen previous data.  Temp 98.4 F (36.9 C) (Oral)   Resp 16   Ht 5\' 4"  (1.626 m)   Wt 77.6 kg   SpO2 96% Comment: Simultaneous filing. User may not have seen previous data.  BMI 29.35 kg/m   Physical Exam Vitals signs and nursing note reviewed.  Constitutional:      General: She is not in acute distress.    Appearance: She is well-developed. She is not diaphoretic.  HENT:     Head: Normocephalic and atraumatic.  Neck:     Musculoskeletal: Normal range of motion and neck supple.  Cardiovascular:     Rate and Rhythm: Normal rate and regular rhythm.     Heart sounds: No murmur. No friction rub. No gallop.   Pulmonary:     Effort: Pulmonary effort is normal. No respiratory distress.     Breath sounds: Normal breath sounds. No wheezing.  Abdominal:     General: Bowel sounds are normal. There is no distension.     Palpations: Abdomen is soft.     Tenderness: There is no abdominal tenderness.  Musculoskeletal: Normal range of motion.     Comments: There is deformity to the left wrist.   Patient is able to move all fingers, however there is pain with range of motion.  Motor and sensation are intact to all fingers and capillary refill is brisk.  Skin:    General: Skin is warm and dry.  Neurological:     Mental Status: She is alert and oriented to person, place, and time.      ED Treatments / Results  Labs (all labs ordered are listed, but only abnormal results are displayed) Labs  Reviewed - No data to display  EKG None  Radiology No results found.  Procedures Procedures (including critical care time)  Medications Ordered in ED Medications - No data to display   Initial Impression / Assessment and Plan / ED Course  I have reviewed the triage vital signs and the nursing notes.  Pertinent labs & imaging results that were available during my care of the patient were reviewed by me and considered in my medical decision making (see chart for details).  Patient presenting with complaints of left wrist pain.  She fell today while cleaning up leaves in her yard.  Patient has a mildly impacted and dorsally angulated fracture of the left distal radius with soft tissue swelling.  She is neurovascularly intact distally.  This fracture was discussed with Dr. Amedeo Plenty and Yvette Rack from orthopedics.  They are recommending outpatient follow-up.  Patient will be placed in a volar wrist splint and advised to follow-up with Dr. Amedeo Plenty in the next 2 days.  Final Clinical Impressions(s) / ED Diagnoses   Final diagnoses:  None    ED Discharge Orders    None       Veryl Speak, MD 04/29/19 1614

## 2019-04-29 NOTE — ED Triage Notes (Signed)
Pt brought by [facility] from home, pt was doing yard work and stood on [employer] cover that wasn't sealed all the way and pt fell forward. Pt used her hand to stop the fall and injured her left wrist. Pt denies hitting head. Pt's left wrist is deformed and painful. Pt is A&Ox4. Pt has 2+ left radial pulse, hand feels warm, cap refill less than 3 sec, pt able to wiggle fingers, sensation is intact.

## 2019-04-29 NOTE — Discharge Instructions (Signed)
Percocet as prescribed as needed for pain.  Ice for 20 minutes every 2 hours while awake for the next 2 days.  Keep your arm elevated above your heart as much as possible.  Leave splint in place until followed up by orthopedics.  This appointment should occur in the next 2 days.  The contact information for Dr. Amedeo Plenty has been provided in this discharge summary for you to call and make these arrangements.

## 2019-05-12 ENCOUNTER — Telehealth: Payer: Self-pay | Admitting: Internal Medicine

## 2019-05-12 NOTE — Telephone Encounter (Signed)
Pt woke up this morning at 6A and 10A with palps.  States they only lasted a few seconds and were gone.  Checked her BP this morning and it was 152/111.  Took it again and it was 163/120.  These were before meds or shortly after.  She took her medications around 9AM.  BP around 10A was 156/109.  Had pt take it again while on the phone with me and it was down to 129/93, HR 75.  Denies any symptoms and this is the first time this occurred.  Pt feels fine.  Current medications per pt are:  Felodipine 2.5mg  QD Labetalol 200mg - 1 in AM, 1.5 in PM Metoprolol Tartrate- 50mg  BID Valsartan- 80mg  BID  Advised to continue to monitor and let us know if it occurs again.  Advised I will send to Dr. Caryl Comes to see if any further recommendations.

## 2019-05-12 NOTE — Telephone Encounter (Signed)
Patient c/o Palpitations:  High priority if patient c/o lightheadedness, shortness of breath, or chest pain  1) How long have you had palpitations/irregular HR/ Afib? Are you having the symptoms now? No not at this time, had some this morning twice  2) Are you currently experiencing lightheadedness, SOB or CP? no  3) Do you have a history of afib (atrial fibrillation) or irregular heart rhythm? Yes, atrial fib  4) Have you checked your BP or HR? (document readings if available): bp was 152/111 pulse rate 78 and the other one was  163/120  5) Are you experiencing any other symptoms?no

## 2019-05-12 NOTE — Telephone Encounter (Signed)
She should check with her PCP but would Suggest 1) increase felodipine to 5 if BP sys consistently about 140 2) can take metoprolol 50 mg extra if palpitations Thanks SK

## 2019-05-13 NOTE — Telephone Encounter (Signed)
Spoke with pt and discussed Dr. Olin Pia recommendations. She states she believes her blood pressure is elevated because she recently broke her arm. I encouraged her to practice pain management. She has been taking tylenol which she states helps. She will contact her PCP regarding her HTN if it continues as well. She has no additional questions.

## 2019-05-30 ENCOUNTER — Other Ambulatory Visit: Payer: Self-pay | Admitting: Internal Medicine

## 2019-08-04 ENCOUNTER — Telehealth: Payer: Self-pay | Admitting: Internal Medicine

## 2019-08-04 DIAGNOSIS — I10 Essential (primary) hypertension: Secondary | ICD-10-CM

## 2019-08-04 NOTE — Telephone Encounter (Signed)
The pt states that she cannot take Valsartan because it is ineffective in controlling her BP which makes her feel ":Unstable" as her BP is "Up and down". She is requesting that I do a Diovan PA.  AS requested I did a brand name Diovan PA through her Ins provider through covermymeds. Key: [id number]

## 2019-08-04 NOTE — Telephone Encounter (Signed)
Pt calling stating that she needs a prior auth done on her medication Valsartan (Diovan). Pt would like a call back concerning this matter. Please address

## 2019-08-05 MED ORDER — VALSARTAN 80 MG PO TABS: 80.0000 mg | ORAL_TABLET | Freq: Two times a day (BID) | ORAL | 0 refills | Status: DC

## 2019-08-05 NOTE — Telephone Encounter (Signed)
Patient is calling in regards to some questions about the medications previously spoken about.

## 2019-08-05 NOTE — Telephone Encounter (Signed)
I called the pt back but our call dropped. I called her back multiple times but got no ans and no way to leave a message. Will attempt call later today.

## 2019-08-05 NOTE — Addendum Note (Signed)
**Note De-Identified Tiffany Black Obfuscation** Addended by: Dennie Fetters on: 08/05/2019 01:51 PM   Modules accepted: Orders

## 2019-08-05 NOTE — Telephone Encounter (Signed)
I have attempted several more times to call the pt back but continue to not get an answer or a way to leave a message.

## 2019-08-05 NOTE — Telephone Encounter (Addendum)
Message received from Covermymeds:  [name] Key: Lilia Argue - PA Case ID: [id number] Need help? Call us at [phone]  Outcome  Approved on November 23  Request Reference Number: [id number].  DIOVAN TAB 80MG  is approved through 09/10/2020.  I have notified the [facility] and [facility] of this approval and change from Diovan 160 mg- take 0.5 mg tablets BID to Diovan 80 mg take 1 tablet BID.

## 2019-08-22 ENCOUNTER — Telehealth: Payer: Self-pay

## 2019-08-22 DIAGNOSIS — I10 Essential (primary) hypertension: Secondary | ICD-10-CM

## 2019-08-22 NOTE — Telephone Encounter (Signed)
Pt called in requesting a refill on Valsartan. Pt was prescribed a 3 month supply last month. Refill has been requested too early.

## 2019-09-22 ENCOUNTER — Ambulatory Visit: Payer: Medicare Other | Admitting: Internal Medicine

## 2019-10-06 ENCOUNTER — Other Ambulatory Visit: Payer: Self-pay | Admitting: *Deleted

## 2019-10-06 MED ORDER — RIVAROXABAN 20 MG PO TABS: 20.0000 mg | ORAL_TABLET | Freq: Every day | ORAL | 5 refills | Status: DC

## 2019-10-06 NOTE — Telephone Encounter (Signed)
Xarelto 20mg  refill request received. Pt is 86 years old, weight-77.6kg, Crea-0.94 on 10/18/2018, last seen by Dr. Caryl Comes on 01/27/2019, Diagnosis-Afib, CrCl-55.43ml/min; Dose is appropriate based on dosing criteria. Will send in refill to requested pharmacy.

## 2019-10-27 ENCOUNTER — Other Ambulatory Visit: Payer: Self-pay | Admitting: Internal Medicine

## 2019-10-27 MED ORDER — FELODIPINE ER 5 MG PO TB24: 5.0000 mg | ORAL_TABLET | Freq: Every day | ORAL | 0 refills | Status: DC

## 2019-11-20 ENCOUNTER — Other Ambulatory Visit: Payer: Self-pay | Admitting: *Deleted

## 2019-11-20 ENCOUNTER — Telehealth (INDEPENDENT_AMBULATORY_CARE_PROVIDER_SITE_OTHER): Payer: Medicare Other | Admitting: Internal Medicine

## 2019-11-20 VITALS — BP 148/95 | HR 73 | Wt 171.0 lb

## 2019-11-20 DIAGNOSIS — Z7901 Long term (current) use of anticoagulants: Secondary | ICD-10-CM | POA: Diagnosis not present

## 2019-11-20 DIAGNOSIS — E876 Hypokalemia: Secondary | ICD-10-CM | POA: Diagnosis not present

## 2019-11-20 DIAGNOSIS — Z7189 Other specified counseling: Secondary | ICD-10-CM

## 2019-11-20 DIAGNOSIS — I10 Essential (primary) hypertension: Secondary | ICD-10-CM

## 2019-11-20 DIAGNOSIS — I951 Orthostatic hypotension: Secondary | ICD-10-CM

## 2019-11-20 DIAGNOSIS — I4821 Permanent atrial fibrillation: Secondary | ICD-10-CM | POA: Diagnosis not present

## 2019-11-20 MED ORDER — VALSARTAN 80 MG PO TABS: 80.0000 mg | ORAL_TABLET | Freq: Two times a day (BID) | ORAL | 0 refills | Status: DC

## 2019-11-20 MED ORDER — FELODIPINE ER 5 MG PO TB24: 5.0000 mg | ORAL_TABLET | Freq: Every day | ORAL | 3 refills | Status: DC

## 2019-11-20 NOTE — Addendum Note (Signed)
Addended by: Thora Lance on: 11/20/2019 07:50 PM   Modules accepted: Orders

## 2019-11-20 NOTE — Patient Instructions (Signed)
Medication Instructions:  No changes today  Felodipine 5mg  #90/3 refills sent to pharmacy on file  *If you need a refill on your cardiac medications before your next appointment, please call your pharmacy*   Lab Work: None ordered.  If you have labs (blood work) drawn today and your tests are completely normal, you will receive your results only by: Marland Kitchen MyChart Message (if you have MyChart) OR . A paper copy in the mail If you have any lab test that is abnormal or we need to change your treatment, we will call you to review the results.   Testing/Procedures: None ordered.    Follow-Up: At [facility], you and your health needs are our priority.  As part of our continuing mission to provide you with exceptional heart care, we have created designated Provider Care Teams.  These Care Teams include your primary Cardiologist (physician) and Advanced Practice Providers (APPs -  Physician Assistants and Nurse Practitioners) who all work together to provide you with the care you need, when you need it.  We recommend signing up for the patient portal called "MyChart".  Sign up information is provided on this After Visit Summary.  MyChart is used to connect with patients for Virtual Visits (Telemedicine).  Patients are able to view lab/test results, encounter notes, upcoming appointments, etc.  Non-urgent messages can be sent to your provider as well.   To learn more about what you can do with MyChart, go to [email or web address].    Your next appointment:   12 months  The format for your next appointment:   In person  Provider:   Dr Caryl Comes

## 2019-11-20 NOTE — Addendum Note (Signed)
Addended by: Thora Lance on: 11/20/2019 07:40 PM   Modules accepted: Orders

## 2019-11-20 NOTE — Progress Notes (Signed)
Electrophysiology TeleHealth Note   Due to national recommendations of social distancing due to COVID 19, an audio/video telehealth visit is felt to be most appropriate for this patient at this time.  See MyChart message from today for the patient's consent to telehealth for [facility].   Date:  11/20/2019   ID:  Tiffany Black, DOB [birth year], MRN [id number]  Location: patient's home  Provider location: [address], [place]  Evaluation Performed: Follow-up visit  PCP:  Marton Redwood, MD  Cardiologist:     Electrophysiologist:  SK   Chief Complaint: atrial fibrillation  History of Present Illness:    Tiffany Black is a 86 y.o. female who presents via audio/video conferencing for a telehealth visit today.  Since last being seen in our clinic for atrial fibrillation , the patient reports doing quite well    Has not been vaccinated--  The patient denies chest pain, shortness of breath, nocturnal dyspnea, orthopnea or peripheral edema.  There have been no palpitations, lightheadedness or syncope.    BP 149/91 >> 153/97    Date Cr K Hgb  2/20 0.94 4.0 13.2         On Anticoagulation;  No bleeding issues    The patient denies symptoms of fevers, chills, cough, or new SOB worrisome for COVID 19.    Past Medical History:  Diagnosis Date  . Anxiety   . Arthritis   . Bulging of cervical intervertebral disc   . Cardiomegaly   . Cataracts, bilateral   . Chronic neck pain   . History of endometrial cancer   . History of stroke    2015 and 2016 // carotid US 8/16: Bilateral 1-39 // carotid US 2/16: Bilateral ICA 1-39  . HLD (hyperlipidemia)   . Hx of echocardiogram    a. Echo (11/15): Mild LVH, EF 55-60%, normal wall motion, grade 2 diastolic dysfunction, mild LAE, normal RV function, PASP 29 mm Hg // echo 8/16:Mild concentric LVH, EF 55-60, normal wall motion, grade 2 diastolic dysfunction, mild LAE, PASP 39  . Hypertension   . Insomnia   . Lung  nodule   . OA (osteoarthritis)   . Pectus excavatum   . Persistent atrial fibrillation   . Vertigo     Past Surgical History:  Procedure Laterality Date  . ABDOMINAL HYSTERECTOMY  2011  . CATARACT EXTRACTION, BILATERAL Bilateral 2009  . CESAREAN SECTION  1975  . LAPAROSCOPIC HYSTERECTOMY  2011    Current Outpatient Medications  Medication Sig Dispense Refill  . acetaminophen (TYLENOL) 325 MG tablet Take 650 mg by mouth at bedtime as needed for headache (pain).     . cholecalciferol (VITAMIN D) 1000 UNITS tablet Take 1,000 Units by mouth daily after lunch.     . felodipine (PLENDIL) 5 MG 24 hr tablet Take 1 tablet (5 mg total) by mouth daily. Please make yearly appt in May with Dr. Caryl Comes for future refills. 1st attempt 90 tablet 0  . FLUoxetine (PROZAC) 10 MG tablet Take 1 tablet (10 mg total) by mouth every other day. 90 tablet 1  . KLOR-CON M20 20 MEQ tablet TAKE 1/2 TABLET BY MOUTH ONCE DAILY 15 tablet 6  . labetalol (NORMODYNE) 200 MG tablet Take 1 tablet every morning and take 1 and 1/2 tablets at night 225 tablet 3  . meclizine (ANTIVERT) 25 MG tablet Take 1 tablet (25 mg total) by mouth daily as needed for dizziness. 30 tablet 0  . metoprolol tartrate (LOPRESSOR) 50  MG tablet Take 1 tablet (50 mg total) by mouth 2 (two) times daily. (Patient taking differently: Take 50 mg by mouth 2 (two) times daily. Pt is taking 1/2 tablet bid) 180 tablet 3  . rivaroxaban (XARELTO) 20 MG TABS tablet Take 1 tablet (20 mg total) by mouth daily with supper. 30 tablet 5  . valsartan (DIOVAN) 80 MG tablet Take 1 tablet (80 mg total) by mouth 2 (two) times daily. New directions: take one 80 mg tablet twice daily (no longer taking 160 mg 1/2 tablet twice daily) Thank you.  PT IS OVERDUE FOR AN APPOINTMENT AND MUST MAKE ONE FOR FUTURE REFILLS 60 tablet 0  . zolpidem (AMBIEN) 10 MG tablet Take 1/2 tablet by mouth at bedtime for rest 15 tablet 0  . oxyCODONE-acetaminophen (PERCOCET) 5-325 MG tablet Take 1-2  tablets by mouth every 6 (six) hours as needed. (Patient not taking: Reported on 11/20/2019) 20 tablet 0   No current facility-administered medications for this visit.    Allergies:   Hydrochlorothiazide, Penicillins, Ciprofloxacin, Doxycycline, Hydrocodone-acetaminophen, Iodine-131, Latex, Statins, Carbamazepine, Codeine, Sulfamethoxazole, and Sulfonamide derivatives   Social History:  The patient  reports that she has never smoked. She has never used smokeless tobacco. She reports that she does not drink alcohol or use drugs.   Family History:  The patient's   family history includes Dementia in her mother; Heart attack in her father; Heart disease in her father; Stroke in her maternal uncle.   ROS:  Please see the history of present illness.   All other systems are personally reviewed and negative.    Exam:    Vital Signs:  BP (!) 148/95   Pulse 73   Wt 171 lb (77.6 kg)   BMI 29.35 kg/m       Labs/Other Tests and Data Reviewed:    Recent Labs: No results found for requested labs within last 8760 hours.   Wt Readings from Last 3 Encounters:  11/20/19 171 lb (77.6 kg)  04/29/19 171 lb (77.6 kg)  01/27/19 171 lb (77.6 kg)     Other studies personally reviewed: Additional studies/ records that were reviewed today include: As above         ASSESSMENT & PLAN:   Hypertension  Atrial fibrillation - permanent  TIA  Renal insufficiency grade 3  Anxiety  Orthostatic hypotension    Have recommended that she reconsider spironolactone to improve BP control and address her hypokalemia   Reviewed with her the notes from 2018 as to the introduction of spiro and its discontinuation because she was concerned that "it would flush other meds out of her body"  Have suggested she discuss with Dr Brigitte Pulse at the appt scheduled in May  On Anticoagulation;  No bleeding issues   Tolerating afib with few palpitations    COVID 19 screen The patient denies symptoms of COVID 19  at this time.  The importance of social distancing was discussed today.  Follow-up:  65m     Current medicines are reviewed at length with the patient today.   The patient does not have concerns regarding her medicines.  The following changes were made today: needs refill for felodipine  Labs/ tests ordered today include:   No orders of the defined types were placed in this encounter.   Future tests ( post COVID )     Patient Risk:  after full review of this patients clinical status, I feel that they are at moderate risk at this time.  Today,  I have spent 13 minutes with the patient with telehealth technology discussing the above.  Signed, Virl Axe, MD  11/20/2019 3:14 PM     [address] [place] [address] [phone] (office) [phone] (fax)

## 2019-11-24 ENCOUNTER — Other Ambulatory Visit: Payer: Self-pay | Admitting: Internal Medicine

## 2019-11-24 DIAGNOSIS — I10 Essential (primary) hypertension: Secondary | ICD-10-CM

## 2019-11-24 MED ORDER — VALSARTAN 80 MG PO TABS: 80.0000 mg | ORAL_TABLET | Freq: Two times a day (BID) | ORAL | 5 refills | Status: DC

## 2019-12-26 ENCOUNTER — Other Ambulatory Visit: Payer: Self-pay

## 2019-12-26 ENCOUNTER — Encounter (HOSPITAL_COMMUNITY): Payer: Self-pay | Admitting: Emergency Medicine

## 2019-12-26 ENCOUNTER — Emergency Department (HOSPITAL_COMMUNITY)
Admission: EM | Admit: 2019-12-26 | Discharge: 2019-12-26 | Disposition: A | Discharge status: Home / Self Care | Payer: Medicare Other | Attending: Emergency Medicine | Admitting: Emergency Medicine

## 2019-12-26 ENCOUNTER — Emergency Department (HOSPITAL_COMMUNITY): Payer: Medicare Other

## 2019-12-26 DIAGNOSIS — R42 Dizziness and giddiness: Secondary | ICD-10-CM | POA: Diagnosis present

## 2019-12-26 DIAGNOSIS — Z8673 Personal history of transient ischemic attack (TIA), and cerebral infarction without residual deficits: Secondary | ICD-10-CM | POA: Insufficient documentation

## 2019-12-26 DIAGNOSIS — I10 Essential (primary) hypertension: Secondary | ICD-10-CM | POA: Insufficient documentation

## 2019-12-26 DIAGNOSIS — I4819 Other persistent atrial fibrillation: Secondary | ICD-10-CM | POA: Diagnosis not present

## 2019-12-26 DIAGNOSIS — Z7901 Long term (current) use of anticoagulants: Secondary | ICD-10-CM | POA: Insufficient documentation

## 2019-12-26 DIAGNOSIS — Z79899 Other long term (current) drug therapy: Secondary | ICD-10-CM | POA: Diagnosis not present

## 2019-12-26 LAB — URINALYSIS, ROUTINE W REFLEX MICROSCOPIC
Bilirubin Urine: NEGATIVE
Glucose, UA: NEGATIVE mg/dL
Hgb urine dipstick: NEGATIVE
Ketones, ur: NEGATIVE mg/dL
Nitrite: NEGATIVE
Protein, ur: NEGATIVE mg/dL
Specific Gravity, Urine: 1.015 (ref 1.005–1.030)
pH: 5 (ref 5.0–8.0)

## 2019-12-26 LAB — DIFFERENTIAL
Abs Immature Granulocytes: 0.02 10*3/uL (ref 0.00–0.07)
Basophils Absolute: 0.1 10*3/uL (ref 0.0–0.1)
Basophils Relative: 1 %
Eosinophils Absolute: 0.4 10*3/uL (ref 0.0–0.5)
Eosinophils Relative: 7 %
Immature Granulocytes: 0 %
Lymphocytes Relative: 22 %
Lymphs Abs: 1.3 10*3/uL (ref 0.7–4.0)
Monocytes Absolute: 0.4 10*3/uL (ref 0.1–1.0)
Monocytes Relative: 6 %
Neutro Abs: 3.8 10*3/uL (ref 1.7–7.7)
Neutrophils Relative %: 64 %

## 2019-12-26 LAB — COMPREHENSIVE METABOLIC PANEL
ALT: 14 U/L (ref 0–44)
AST: 19 U/L (ref 15–41)
Albumin: 3.7 g/dL (ref 3.5–5.0)
Alkaline Phosphatase: 69 U/L (ref 38–126)
Anion gap: 7 (ref 5–15)
BUN: 17 mg/dL (ref 8–23)
CO2: 26 mmol/L (ref 22–32)
Calcium: 9.3 mg/dL (ref 8.9–10.3)
Chloride: 107 mmol/L (ref 98–111)
Creatinine, Ser: 1.16 mg/dL — ABNORMAL HIGH (ref 0.44–1.00)
GFR calc Af Amer: 50 mL/min — ABNORMAL LOW (ref >60)
GFR calc non Af Amer: 43 mL/min — ABNORMAL LOW (ref >60)
Glucose, Bld: 116 mg/dL — ABNORMAL HIGH (ref 70–99)
Potassium: 4.2 mmol/L (ref 3.5–5.1)
Sodium: 140 mmol/L (ref 135–145)
Total Bilirubin: 1.1 mg/dL (ref 0.3–1.2)
Total Protein: 6.5 g/dL (ref 6.5–8.1)

## 2019-12-26 LAB — CBC
HCT: 42.1 % (ref 36.0–46.0)
Hemoglobin: 13.7 g/dL (ref 12.0–15.0)
MCH: 31.1 pg (ref 26.0–34.0)
MCHC: 32.5 g/dL (ref 30.0–36.0)
MCV: 95.7 fL (ref 80.0–100.0)
Platelets: 189 10*3/uL (ref 150–400)
RBC: 4.4 MIL/uL (ref 3.87–5.11)
RDW: 12.8 % (ref 11.5–15.5)
WBC: 5.9 10*3/uL (ref 4.0–10.5)
nRBC: 0 % (ref 0.0–0.2)

## 2019-12-26 LAB — I-STAT CHEM 8, ED
BUN: 19 mg/dL (ref 8–23)
Calcium, Ion: 1.17 mmol/L (ref 1.15–1.40)
Chloride: 104 mmol/L (ref 98–111)
Creatinine, Ser: 1.2 mg/dL — ABNORMAL HIGH (ref 0.44–1.00)
Glucose, Bld: 107 mg/dL — ABNORMAL HIGH (ref 70–99)
HCT: 40 % (ref 36.0–46.0)
Hemoglobin: 13.6 g/dL (ref 12.0–15.0)
Potassium: 4.1 mmol/L (ref 3.5–5.1)
Sodium: 140 mmol/L (ref 135–145)
TCO2: 29 mmol/L (ref 22–32)

## 2019-12-26 LAB — APTT: aPTT: 30 seconds (ref 24–36)

## 2019-12-26 LAB — PROTIME-INR
INR: 1.3 — ABNORMAL HIGH (ref 0.8–1.2)
Prothrombin Time: 16.4 seconds — ABNORMAL HIGH (ref 11.4–15.2)

## 2019-12-26 MED ORDER — MECLIZINE HCL 12.5 MG PO TABS: 12.5000 mg | ORAL_TABLET | Freq: Three times a day (TID) | ORAL | 0 refills | Status: DC | PRN

## 2019-12-26 NOTE — ED Notes (Signed)
Pt refusing to go to MRI and wants to leave. Dr. Kathrynn Humble notified.

## 2019-12-26 NOTE — ED Triage Notes (Signed)
Patient states she must've mixed up some of her medications yesterday after working outside on the patio and started having dizziness around supper time last night. States she felt very wobbly but has improved this am after checking her medications and taking them correctly. No neuro deficits noted in triage.

## 2019-12-26 NOTE — ED Provider Notes (Deleted)
  Physical Exam  BP (!) 166/104 (BP Location: Left Arm)   Pulse 85   Temp 98.3 F (36.8 C) (Oral)   Resp 20   Ht 5\' 5"  (1.651 m)   Wt 77.1 kg   SpO2 98%   BMI 28.29 kg/m   Physical Exam  ED Course/Procedures     Procedures  MDM    Assuming care of patient from Dr. Ralene Bathe   Patient in the ED for dizziness. Workup thus far shows normal neuro exam and labs.  Concerning findings are as following : none. Important pending results are : MRI brain.  According to Dr. Ralene Bathe, plan is to d/c patient if the MRI is neg.   Patient had no complains, no concerns from the nursing side. Will continue to monitor.    Varney Biles, MD 12/26/19 [phone]

## 2019-12-26 NOTE — ED Notes (Signed)
Patient verbalizes understanding of discharge instructions. Opportunity for questioning and answers were provided. Armband removed by staff, pt discharged from ED via wheelchair but was able to ambulate to car with steady gait.

## 2019-12-26 NOTE — ED Provider Notes (Addendum)
[facility] EMERGENCY DEPARTMENT Provider Note   CSN: [id number] Arrival date & time: 12/26/19  1006     History Chief Complaint  Patient presents with  . Dizziness    Tiffany Black is a 86 y.o. female.  The history is provided by the patient, medical records and a relative. No language interpreter was used.  Dizziness  Tiffany Black is a 86 y.o. female who presents to the Emergency Department complaining of dizziness.  She presents to the ED for evaluation of severe dizziness that began last night.  She had difficulty sleeping due to the dizziness.  Dizziness is currently gone.  She is unable to elaborate on exacerbating or alleviating factors.  Level V caveat due to confusion.    Denies fevers, sob, N/V/D, dysuria, numbness, weakness.    Her son lives with her.      Past Medical History:  Diagnosis Date  . Anxiety   . Arthritis   . Bulging of cervical intervertebral disc   . Cardiomegaly   . Cataracts, bilateral   . Chronic neck pain   . History of endometrial cancer   . History of stroke    2015 and 2016 // carotid US 8/16: Bilateral 1-39 // carotid US 2/16: Bilateral ICA 1-39  . HLD (hyperlipidemia)   . Hx of echocardiogram    a. Echo (11/15): Mild LVH, EF 55-60%, normal wall motion, grade 2 diastolic dysfunction, mild LAE, normal RV function, PASP 29 mm Hg // echo 8/16:Mild concentric LVH, EF 55-60, normal wall motion, grade 2 diastolic dysfunction, mild LAE, PASP 39  . Hypertension   . Insomnia   . Lung nodule   . OA (osteoarthritis)   . Pectus excavatum   . Persistent atrial fibrillation ([place])   . Vertigo     Patient Active Problem List   Diagnosis Date Noted  . Headache syndrome 07/02/2017  . Chest pain 05/23/2015  . Essential hypertension   . History of stroke 05/10/2015  . Dizziness 05/10/2015  . Orthostatic hypotension 05/10/2015  . CVA (cerebral infarction) 05/10/2015  . Hypokalemia 09/30/2013  . Vertigo 02/28/2013  . Sinusitis  02/28/2013  . Anxiety 02/28/2013  . PAC (premature atrial contraction) 04/10/2012  . Abnormal EKG 01/19/2012  . Persistent atrial fibrillation ([place]) 02/15/2010    Past Surgical History:  Procedure Laterality Date  . ABDOMINAL HYSTERECTOMY  2011  . CATARACT EXTRACTION, BILATERAL Bilateral 2009  . CESAREAN SECTION  1975  . LAPAROSCOPIC HYSTERECTOMY  2011     OB History   No obstetric history on file.     Family History  Problem Relation Age of Onset  . Dementia Mother   . Heart attack Father   . Heart disease Father   . Stroke Maternal Uncle   . Hypertension Neg Hx     Social History   Tobacco Use  . Smoking status: Never Smoker  . Smokeless tobacco: Never Used  Substance Use Topics  . Alcohol use: No  . Drug use: No    Home Medications Prior to Admission medications   Medication Sig Start Date End Date Taking? Authorizing Provider  acetaminophen (TYLENOL) 500 MG tablet Take 500 mg by mouth every 6 (six) hours as needed for headache.   Yes [provider]  Cholecalciferol (VITAMIN D-3 PO) Take 1 capsule by mouth daily.   Yes [provider]  felodipine (PLENDIL) 5 MG 24 hr tablet Take 1 tablet (5 mg total) by mouth daily. Patient taking differently: Take 5 mg by  mouth daily after supper.  11/20/19  Yes Deboraha Sprang, MD  FLUoxetine (PROZAC) 10 MG tablet Take 1 tablet (10 mg total) by mouth every other day. Patient taking differently: Take 10 mg by mouth daily.  01/27/19  Yes Deboraha Sprang, MD  KLOR-CON M20 20 MEQ tablet TAKE 1/2 TABLET BY MOUTH ONCE DAILY Patient taking differently: Take 10 mEq by mouth daily.  10/03/17  Yes Lauree Chandler, NP  labetalol (NORMODYNE) 200 MG tablet Take 1 tablet every morning and take 1 and 1/2 tablets at night Patient taking differently: Take 200-300 mg by mouth See admin instructions. Take 200 mg by mouth in the morning and 300 mg in the evening 01/27/19  Yes Deboraha Sprang, MD  metoprolol tartrate (LOPRESSOR)  50 MG tablet Take 1 tablet (50 mg total) by mouth 2 (two) times daily. Patient taking differently: Take 25 mg by mouth 2 (two) times daily.  03/26/19  Yes Deboraha Sprang, MD  rivaroxaban (XARELTO) 20 MG TABS tablet Take 1 tablet (20 mg total) by mouth daily with supper. 10/06/19  Yes Deboraha Sprang, MD  valsartan (DIOVAN) 160 MG tablet Take 80 mg by mouth 2 (two) times daily.   Yes [provider]  zolpidem (AMBIEN) 10 MG tablet Take 1/2 tablet by mouth at bedtime for rest Patient taking differently: Take 10 mg by mouth at bedtime.  12/19/17  Yes Lauree Chandler, NP  meclizine (ANTIVERT) 12.5 MG tablet Take 1 tablet (12.5 mg total) by mouth 3 (three) times daily as needed for dizziness. 12/26/19   Quintella Reichert, MD  oxyCODONE-acetaminophen (PERCOCET) 5-325 MG tablet Take 1-2 tablets by mouth every 6 (six) hours as needed. Patient not taking: Reported on 12/26/2019 04/29/19   Veryl Speak, MD  valsartan (DIOVAN) 80 MG tablet Take 1 tablet (80 mg total) by mouth 2 (two) times daily. take one 80 mg tablet twice daily (no longer taking 160 mg 1/2 tablet twice daily) Thank you. Patient not taking: Reported on 12/26/2019 11/24/19   Deboraha Sprang, MD    Allergies    Hydrochlorothiazide, Penicillins, Ciprofloxacin, Doxycycline, Hydrocodone-acetaminophen, Iodine-131, Latex, Statins, Carbamazepine, Codeine, Sulfamethoxazole, and Sulfonamide derivatives  Review of Systems   Review of Systems  Neurological: Positive for dizziness.  All other systems reviewed and are negative.   Physical Exam Updated Vital Signs BP (!) 166/104 (BP Location: Left Arm)   Pulse 85   Temp 98.3 F (36.8 C) (Oral)   Resp 20   Ht 5\' 5"  (1.651 m)   Wt 77.1 kg   SpO2 98%   BMI 28.29 kg/m   Physical Exam Vitals and nursing note reviewed.  Constitutional:      Appearance: She is well-developed.  HENT:     Head: Normocephalic and atraumatic.  Eyes:     Extraocular Movements: Extraocular movements intact.       Pupils: Pupils are equal, round, and reactive to light.  Cardiovascular:     Rate and Rhythm: Normal rate. Rhythm irregular.     Heart sounds: No murmur.  Pulmonary:     Effort: Pulmonary effort is normal. No respiratory distress.     Breath sounds: Normal breath sounds.  Abdominal:     Palpations: Abdomen is soft.     Tenderness: There is no abdominal tenderness. There is no guarding or rebound.  Musculoskeletal:        General: No swelling or tenderness.  Skin:    General: Skin is warm and dry.  Neurological:  Mental Status: She is alert.     Comments: No asymmetry of facial movements.  Visual fields grossly intact.  No pronator drift.  5/5 strength in all four extremities with sensation to light touch intact in all four extremities. Oriented to person in place, disoriented to time.  Psychiatric:        Behavior: Behavior normal.     ED Results / Procedures / Treatments   Labs (all labs ordered are listed, but only abnormal results are displayed) Labs Reviewed  PROTIME-INR - Abnormal; Notable for the following components:      Result Value   Prothrombin Time 16.4 (*)    INR 1.3 (*)    All other components within normal limits  COMPREHENSIVE METABOLIC PANEL - Abnormal; Notable for the following components:   Glucose, Bld 116 (*)    Creatinine, Ser 1.16 (*)    GFR calc non Af Amer 43 (*)    GFR calc Af Amer 50 (*)    All other components within normal limits  URINALYSIS, ROUTINE W REFLEX MICROSCOPIC - Abnormal; Notable for the following components:   APPearance HAZY (*)    Leukocytes,Ua MODERATE (*)    Bacteria, UA RARE (*)    All other components within normal limits  I-STAT CHEM 8, ED - Abnormal; Notable for the following components:   Creatinine, Ser 1.20 (*)    Glucose, Bld 107 (*)    All other components within normal limits  URINE CULTURE  APTT  CBC  DIFFERENTIAL    EKG EKG Interpretation  Date/Time:  Friday December 26 2019 10:07:47 EDT Ventricular  Rate:  80 PR Interval:    QRS Duration: 84 QT Interval:  332 QTC Calculation: 382 R Axis:   -52 Text Interpretation: Atrial fibrillation Left anterior fascicular block Possible Anterior infarct , age undetermined Abnormal ECG Confirmed by Quintella Reichert [phone]) on 12/26/2019 2:23:39 PM   Radiology CT HEAD WO CONTRAST  Result Date: 12/26/2019 CLINICAL DATA:  Dizziness EXAM: CT HEAD WITHOUT CONTRAST TECHNIQUE: Contiguous axial images were obtained from the base of the skull through the vertex without intravenous contrast. COMPARISON:  2016 FINDINGS: Brain: There is no acute intracranial hemorrhage, mass effect, or edema. Gray-white differentiation is preserved. There is no extra-axial fluid collection. Prominence of the ventricles and sulci reflects minor generalized parenchymal volume loss. Patchy and confluent areas of hypoattenuation in the supratentorial white matter are nonspecific but probably reflect moderate chronic microvascular ischemic changes, which have progressed since 2016. Vascular: There is atherosclerotic calcification at the skull base. Skull: Calvarium is unremarkable. Sinuses/Orbits: Partially imaged chronic left maxillary sinusitis. Otherwise patchy mild mucosal thickening. No acute orbital finding. Other: None. IMPRESSION: No acute intracranial abnormality. Chronic/nonemergent findings detailed above. Electronically Signed   By: Macy Mis M.D.   On: 12/26/2019 11:18    Procedures Procedures (including critical care time)  Medications Ordered in ED Medications - No data to display  ED Course  I have reviewed the triage vital signs and the nursing notes.  Pertinent labs & imaging results that were available during my care of the patient were reviewed by me and considered in my medical decision making (see chart for details).    MDM Rules/Calculators/A&P                     Patient with history of atrial fibrillation here for evaluation of dizziness since last  night. Symptoms are currently completely resolved. She is mildly confused over date, otherwise neurologically intact. Patient care  transferred pending MRI to rule out CVA.  On repeat assessment patient declines MRI of the brain and wishes to go home. Discussed with patient and son findings of studies and risks and benefits of MRI. Feel it is safe to defer the study at this time. Plan to discharge home with outpatient follow-up and return precautions. Feel acute CVA is unlikely at this point in time as patient is completely asymptomatic. She is currently on anticoagulation for atrial fibrillation. Discussed importance of PCP follow-up as well as return precautions for dizziness. Final Clinical Impression(s) / ED Diagnoses Final diagnoses:  Dizziness    Rx / DC Orders ED Discharge Orders         Ordered    meclizine (ANTIVERT) 12.5 MG tablet  3 times daily PRN     12/26/19 1610           Quintella Reichert, MD 12/26/19 1515    Quintella Reichert, MD 12/26/19 1615

## 2019-12-27 LAB — URINE CULTURE: Culture: <10000 — AB

## 2020-01-08 ENCOUNTER — Other Ambulatory Visit: Payer: Self-pay | Admitting: Internal Medicine

## 2020-01-25 NOTE — Progress Notes (Deleted)
PCP:  Marton Redwood, MD Primary Cardiologist: No primary care provider on file. Electrophysiologist: Dr. Birdena Jubilee Juman is a 86 y.o. female seen today for Dr. Caryl Comes for routine electrophysiology followup.  Since last being seen in our clinic the patient reports doing ***.  she denies chest pain, palpitations, dyspnea, PND, orthopnea, nausea, vomiting, dizziness, syncope, edema, weight gain, or early satiety.  Past Medical History:  Diagnosis Date  . Anxiety   . Arthritis   . Bulging of cervical intervertebral disc   . Cardiomegaly   . Cataracts, bilateral   . Chronic neck pain   . History of endometrial cancer   . History of stroke    2015 and 2016 // carotid US 8/16: Bilateral 1-39 // carotid US 2/16: Bilateral ICA 1-39  . HLD (hyperlipidemia)   . Hx of echocardiogram    a. Echo (11/15): Mild LVH, EF 55-60%, normal wall motion, grade 2 diastolic dysfunction, mild LAE, normal RV function, PASP 29 mm Hg // echo 8/16:Mild concentric LVH, EF 55-60, normal wall motion, grade 2 diastolic dysfunction, mild LAE, PASP 39  . Hypertension   . Insomnia   . Lung nodule   . OA (osteoarthritis)   . Pectus excavatum   . Persistent atrial fibrillation ([place])   . Vertigo    Past Surgical History:  Procedure Laterality Date  . ABDOMINAL HYSTERECTOMY  2011  . CATARACT EXTRACTION, BILATERAL Bilateral 2009  . CESAREAN SECTION  1975  . LAPAROSCOPIC HYSTERECTOMY  2011    Current Outpatient Medications  Medication Sig Dispense Refill  . acetaminophen (TYLENOL) 500 MG tablet Take 500 mg by mouth every 6 (six) hours as needed for headache.    . Cholecalciferol (VITAMIN D-3 PO) Take 1 capsule by mouth daily.    . felodipine (PLENDIL) 5 MG 24 hr tablet Take 1 tablet (5 mg total) by mouth daily. (Patient taking differently: Take 5 mg by mouth daily after supper. ) 90 tablet 3  . FLUoxetine (PROZAC) 10 MG tablet Take 1 tablet (10 mg total) by mouth every other day. (Patient taking differently:  Take 10 mg by mouth daily. ) 90 tablet 1  . KLOR-CON M20 20 MEQ tablet TAKE 1/2 TABLET BY MOUTH ONCE DAILY (Patient taking differently: Take 10 mEq by mouth daily. ) 15 tablet 6  . labetalol (NORMODYNE) 200 MG tablet TAKE 1 TABLET EVERY MORNING AND TAKE 1 AND 1/2 TABLETS AT NIGHT *[phone]* 225 tablet 3  . meclizine (ANTIVERT) 12.5 MG tablet Take 1 tablet (12.5 mg total) by mouth 3 (three) times daily as needed for dizziness. 8 tablet 0  . metoprolol tartrate (LOPRESSOR) 50 MG tablet Take 1 tablet (50 mg total) by mouth 2 (two) times daily. (Patient taking differently: Take 25 mg by mouth 2 (two) times daily. ) 180 tablet 3  . oxyCODONE-acetaminophen (PERCOCET) 5-325 MG tablet Take 1-2 tablets by mouth every 6 (six) hours as needed. (Patient not taking: Reported on 12/26/2019) 20 tablet 0  . rivaroxaban (XARELTO) 20 MG TABS tablet Take 1 tablet (20 mg total) by mouth daily with supper. 30 tablet 5  . valsartan (DIOVAN) 160 MG tablet Take 80 mg by mouth 2 (two) times daily.    . valsartan (DIOVAN) 80 MG tablet Take 1 tablet (80 mg total) by mouth 2 (two) times daily. take one 80 mg tablet twice daily (no longer taking 160 mg 1/2 tablet twice daily) Thank you. (Patient not taking: Reported on 12/26/2019) 60 tablet 5  . zolpidem (AMBIEN)  10 MG tablet Take 1/2 tablet by mouth at bedtime for rest (Patient taking differently: Take 10 mg by mouth at bedtime. ) 15 tablet 0   No current facility-administered medications for this visit.    Allergies  Allergen Reactions  . Hydrochlorothiazide Other (See Comments)    Patient doesn't recall the reaction  . Penicillins Hives    Has patient had a PCN reaction causing immediate rash, facial/tongue/throat swelling, SOB or lightheadedness with hypotension: Yes Has patient had a PCN reaction causing severe rash involving mucus membranes or skin necrosis: No Has patient had a PCN reaction that required hospitalization No Has patient had a PCN reaction occurring  within the last 10 years: No If all of the above answers are "NO", then may proceed with Cephalosporin use.   . Ciprofloxacin Other (See Comments)    Blood pressure issues- patient doesn't recall if it increased OR decreased it  . Doxycycline Diarrhea and Nausea And Vomiting  . Hydrocodone-Acetaminophen Other (See Comments)    Unknown reaction  . Iodine-131 Other (See Comments) and Hypertension    Raised the blood pressure  . Latex Itching  . Statins Other (See Comments)    Muscle weakness  . Carbamazepine Other (See Comments) and Rash    Flushed blood pressure medication out of system  . Codeine Nausea And Vomiting    Nausea and vomiting  . Sulfamethoxazole Hives and Rash  . Sulfonamide Derivatives Hives and Rash         Social History   Socioeconomic History  . Marital status: Widowed    Spouse name: Not on file  . Number of children: 3  . Years of education: 75  . Highest education level: Not on file  Occupational History  . Occupation: RetiredNurse, learning disability  Tobacco Use  . Smoking status: Never Smoker  . Smokeless tobacco: Never Used  Substance and Sexual Activity  . Alcohol use: No  . Drug use: No  . Sexual activity: Never  Other Topics Concern  . Not on file  Social History Narrative   Diet: N/A   Caffeine: N/A   Married, if yes what year: Widow, married 1956   Do you live in a house, apartment, assisted living, condo, trailer, ect: House, one stories, 2 persons   Pets: None   Highest level of education: 12th Grade    Current/Past profession: Network engineer    Exercise: Yes, walking everyday    Right handed   Living Will:    DNR:   POA/HPOA:   Functional Status:   Do you have difficulty bathing or dressing yourself? No   Do you have difficulty preparing food or eating? No    Do you have difficulty managing your medications? No   Do you have difficulty managing your finances? No    Do you have difficulty affording your medications? Yes   Social Determinants  of Health   Financial Resource Strain:   . Difficulty of Paying Living Expenses:   Food Insecurity:   . Worried About Charity fundraiser in the Last Year:   . Arboriculturist in the Last Year:   Transportation Needs:   . Film/video editor (Medical):   Marland Kitchen Lack of Transportation (Non-Medical):   Physical Activity:   . Days of Exercise per Week:   . Minutes of Exercise per Session:   Stress:   . Feeling of Stress :   Social Connections:   . Frequency of Communication with Friends and Family:   .  Frequency of Social Gatherings with Friends and Family:   . Attends Religious Services:   . Active Member of Clubs or Organizations:   . Attends Archivist Meetings:   Marland Kitchen Marital Status:   Intimate Partner Violence:   . Fear of Current or Ex-Partner:   . Emotionally Abused:   Marland Kitchen Physically Abused:   . Sexually Abused:      Review of Systems: General: No chills, fever, night sweats or weight changes  Cardiovascular:  No chest pain, dyspnea on exertion, edema, orthopnea, palpitations, paroxysmal nocturnal dyspnea Dermatological: No rash, lesions or masses Respiratory: No cough, dyspnea Urologic: No hematuria, dysuria Abdominal: No nausea, vomiting, diarrhea, bright red blood per rectum, melena, or hematemesis Neurologic: No visual changes, weakness, changes in mental status All other systems reviewed and are otherwise negative except as noted above.  Physical Exam: There were no vitals filed for this visit.  GEN- The patient is well appearing, alert and oriented x 3 today.   HEENT: normocephalic, atraumatic; sclera clear, conjunctiva pink; hearing intact; oropharynx clear; neck supple, no JVP Lymph- no cervical lymphadenopathy Lungs- Clear to ausculation bilaterally, normal work of breathing.  No wheezes, rales, rhonchi Heart- Regular rate and rhythm, no murmurs, rubs or gallops, PMI not laterally displaced GI- soft, non-tender, non-distended, bowel sounds present, no  hepatosplenomegaly Extremities- no clubbing, cyanosis, or edema; DP/PT/radial pulses 2+ bilaterally MS- no significant deformity or atrophy Skin- warm and dry, no rash or lesion Psych- euthymic mood, full affect Neuro- strength and sensation are intact  EKG is not ordered.   Additional studies reviewed include: ***  Assessment and Plan:  1. Permanent atrial fibrillation  2. HTN  3. CKD III  4. H/o TIA  Shirley Friar, PA-C  01/25/20 10:47 AM

## 2020-01-26 ENCOUNTER — Other Ambulatory Visit: Payer: Self-pay | Admitting: Internal Medicine

## 2020-01-26 ENCOUNTER — Ambulatory Visit: Payer: Medicare Other | Admitting: Student

## 2020-01-26 DIAGNOSIS — I10 Essential (primary) hypertension: Secondary | ICD-10-CM

## 2020-02-10 ENCOUNTER — Other Ambulatory Visit: Payer: Self-pay | Admitting: Internal Medicine

## 2020-02-11 NOTE — Telephone Encounter (Signed)
Pt's pharmacy is stating that pt wants to continue taking valsartan 160 mg tablet. Pt would like a call concerning this matter. Please address

## 2020-02-17 NOTE — Telephone Encounter (Signed)
Spoke with pt and advised Diovan 160mg  is not on pt's formulary and she should contact her insurance company to see what the difference in coverage will be for the 80mg  vs 160mg .  Pt states she recently had Diovan refilled while in the hospital so she does not currently need medication.  Pt states she will contact her insurance company re: coverage.  Pt verbalizes understanding and agrees with current plan.

## 2020-04-12 ENCOUNTER — Other Ambulatory Visit: Payer: Self-pay

## 2020-04-12 MED ORDER — METOPROLOL TARTRATE 50 MG PO TABS: 50.0000 mg | ORAL_TABLET | Freq: Two times a day (BID) | ORAL | 2 refills | Status: DC

## 2020-07-01 ENCOUNTER — Telehealth: Payer: Self-pay

## 2020-07-01 NOTE — Telephone Encounter (Signed)
Spoke with pt and advised RN has spoken with "Tiffany Black" [facility], pharmacist who advised new manufacturer for Labetalol.  Pt advised taking this particular medication from a different company should not be an issue for her.  Advised to contact office if she does have any problems tolerating medication. Pt verbalizes understanding and will have pharmacy fill medication.

## 2020-07-01 NOTE — Telephone Encounter (Signed)
[facility] pharmacy is stating that pt's medication labetalol manufacturer is no longer manufacturing the brand of medication that the pt is used to, but the pharmacy has a new manufacturer Par that has the medication available and pharmacy and pt would like to know if its ok to use this manufacturer? Please address, so pt can be informed that Dr. Caryl Comes is aware and that it is okay to use this manufacturer. Thanks

## 2020-07-12 ENCOUNTER — Other Ambulatory Visit: Payer: Self-pay

## 2020-07-12 MED ORDER — LABETALOL HCL 200 MG PO TABS: ORAL_TABLET | ORAL | 3 refills | Status: DC

## 2020-08-03 ENCOUNTER — Emergency Department (HOSPITAL_COMMUNITY): Payer: Medicare Other

## 2020-08-03 ENCOUNTER — Emergency Department (HOSPITAL_COMMUNITY)
Admission: EM | Admit: 2020-08-03 | Discharge: 2020-08-03 | Disposition: A | Discharge status: Home / Self Care | Payer: Medicare Other | Attending: Emergency Medicine | Admitting: Emergency Medicine

## 2020-08-03 DIAGNOSIS — Z91040 Latex allergy status: Secondary | ICD-10-CM | POA: Diagnosis not present

## 2020-08-03 DIAGNOSIS — I4891 Unspecified atrial fibrillation: Secondary | ICD-10-CM | POA: Diagnosis not present

## 2020-08-03 DIAGNOSIS — Z20822 Contact with and (suspected) exposure to covid-19: Secondary | ICD-10-CM | POA: Diagnosis not present

## 2020-08-03 DIAGNOSIS — Z8542 Personal history of malignant neoplasm of other parts of uterus: Secondary | ICD-10-CM | POA: Insufficient documentation

## 2020-08-03 DIAGNOSIS — I10 Essential (primary) hypertension: Secondary | ICD-10-CM | POA: Insufficient documentation

## 2020-08-03 DIAGNOSIS — Z79899 Other long term (current) drug therapy: Secondary | ICD-10-CM | POA: Insufficient documentation

## 2020-08-03 DIAGNOSIS — R531 Weakness: Secondary | ICD-10-CM | POA: Diagnosis present

## 2020-08-03 DIAGNOSIS — R0789 Other chest pain: Secondary | ICD-10-CM | POA: Insufficient documentation

## 2020-08-03 DIAGNOSIS — Z7901 Long term (current) use of anticoagulants: Secondary | ICD-10-CM | POA: Insufficient documentation

## 2020-08-03 LAB — BASIC METABOLIC PANEL
Anion gap: 11 (ref 5–15)
BUN: 14 mg/dL (ref 8–23)
CO2: 24 mmol/L (ref 22–32)
Calcium: 9.3 mg/dL (ref 8.9–10.3)
Chloride: 103 mmol/L (ref 98–111)
Creatinine, Ser: 1.06 mg/dL — ABNORMAL HIGH (ref 0.44–1.00)
GFR, Estimated: 52 mL/min — ABNORMAL LOW (ref >60)
Glucose, Bld: 107 mg/dL — ABNORMAL HIGH (ref 70–99)
Potassium: 3.6 mmol/L (ref 3.5–5.1)
Sodium: 138 mmol/L (ref 135–145)

## 2020-08-03 LAB — URINALYSIS, ROUTINE W REFLEX MICROSCOPIC
Bilirubin Urine: NEGATIVE
Glucose, UA: NEGATIVE mg/dL
Hgb urine dipstick: NEGATIVE
Ketones, ur: NEGATIVE mg/dL
Nitrite: NEGATIVE
Protein, ur: NEGATIVE mg/dL
Specific Gravity, Urine: 1.011 (ref 1.005–1.030)
pH: 7 (ref 5.0–8.0)

## 2020-08-03 LAB — CBC
HCT: 42.3 % (ref 36.0–46.0)
Hemoglobin: 13.3 g/dL (ref 12.0–15.0)
MCH: 30.4 pg (ref 26.0–34.0)
MCHC: 31.4 g/dL (ref 30.0–36.0)
MCV: 96.8 fL (ref 80.0–100.0)
Platelets: 183 10*3/uL (ref 150–400)
RBC: 4.37 MIL/uL (ref 3.87–5.11)
RDW: 12.8 % (ref 11.5–15.5)
WBC: 7.1 10*3/uL (ref 4.0–10.5)
nRBC: 0 % (ref 0.0–0.2)

## 2020-08-03 LAB — RESPIRATORY PANEL BY RT PCR (FLU A&B, COVID)
Influenza A by PCR: NEGATIVE
Influenza B by PCR: NEGATIVE
SARS Coronavirus 2 by RT PCR: NEGATIVE

## 2020-08-03 LAB — TROPONIN I (HIGH SENSITIVITY)
Troponin I (High Sensitivity): 7 ng/L (ref <18)
Troponin I (High Sensitivity): 7 ng/L (ref <18)

## 2020-08-03 LAB — CK: Total CK: 88 U/L (ref 38–234)

## 2020-08-03 MED ORDER — SODIUM CHLORIDE 0.9 % IV BOLUS
500.0000 mL | Freq: Once | INTRAVENOUS | Status: AC
  Administered 2020-08-03: 500 mL via INTRAVENOUS

## 2020-08-03 MED ORDER — ASPIRIN 81 MG PO CHEW
324.0000 mg | CHEWABLE_TABLET | Freq: Once | ORAL | Status: AC
  Administered 2020-08-03: 324 mg via ORAL
  Filled 2020-08-03: qty 4

## 2020-08-03 NOTE — ED Triage Notes (Signed)
Pt arrives via EMS from home with complaints of hypertension and possible transient  Hallucinations. Pt endorses weakness X1 day. Pt ambulatory on triage. Possible non-compliant with medications. Pt alert and oriented X2 with EMS. Unclear of baseline.

## 2020-08-03 NOTE — ED Notes (Signed)
Patient transported to CT 

## 2020-08-03 NOTE — ED Notes (Signed)
Patient transported to X-ray 

## 2020-08-03 NOTE — ED Provider Notes (Addendum)
[facility] EMERGENCY DEPARTMENT Provider Note   CSN: [id number] Arrival date & time: 08/03/20  0845     History Chief Complaint  Patient presents with  . Hypertension    Tiffany Black is a 86 y.o. female.  HPI 86 year old female presents with leg weakness and abnormal chest sensation.  The patient tells me that she started feeling a discomfort and abnormal feeling in her chest.  Does not really pain.  She wonders if it is her A. fib.  There is no shortness of breath or vomiting.  Symptoms seem to be better yesterday.  At the same time this started she also felt a weakness in her legs bilaterally and felt like it was hard to walk but she could still walk.  Maybe a little dizzy.  Otherwise, felt some of the symptoms throughout the night and because she was still having chest discomfort she called 911.  No urinary symptoms.  Has chronic neck discomfort but no new neck pain.  Was able to walk to the door when EMS arrived.  There is a question of whether or not she was having hallucinations and when asked patient about this she states that she felt like she imagines something about her son's work but did not really happen.  However she denies auditory or visual hallucinations.  I talked to the son, Tarrie Mcmichen, over the phone and he notes that the patient has not been altered.  He states she seemed normal this morning.  She was complaining of some aches throughout her body and some chest discomfort.   Past Medical History:  Diagnosis Date  . Anxiety   . Arthritis   . Bulging of cervical intervertebral disc   . Cardiomegaly   . Cataracts, bilateral   . Chronic neck pain   . History of endometrial cancer   . History of stroke    2015 and 2016 // carotid US 8/16: Bilateral 1-39 // carotid US 2/16: Bilateral ICA 1-39  . HLD (hyperlipidemia)   . Hx of echocardiogram    a. Echo (11/15): Mild LVH, EF 55-60%, normal wall motion, grade 2 diastolic dysfunction, mild LAE,  normal RV function, PASP 29 mm Hg // echo 8/16:Mild concentric LVH, EF 55-60, normal wall motion, grade 2 diastolic dysfunction, mild LAE, PASP 39  . Hypertension   . Insomnia   . Lung nodule   . OA (osteoarthritis)   . Pectus excavatum   . Persistent atrial fibrillation ([place])   . Vertigo     Patient Active Problem List   Diagnosis Date Noted  . Headache syndrome 07/02/2017  . Chest pain 05/23/2015  . Essential hypertension   . History of stroke 05/10/2015  . Dizziness 05/10/2015  . Orthostatic hypotension 05/10/2015  . CVA (cerebral infarction) 05/10/2015  . Hypokalemia 09/30/2013  . Vertigo 02/28/2013  . Sinusitis 02/28/2013  . Anxiety 02/28/2013  . PAC (premature atrial contraction) 04/10/2012  . Abnormal EKG 01/19/2012  . Persistent atrial fibrillation ([place]) 02/15/2010    Past Surgical History:  Procedure Laterality Date  . ABDOMINAL HYSTERECTOMY  2011  . CATARACT EXTRACTION, BILATERAL Bilateral 2009  . CESAREAN SECTION  1975  . LAPAROSCOPIC HYSTERECTOMY  2011     OB History   No obstetric history on file.     Family History  Problem Relation Age of Onset  . Dementia Mother   . Heart attack Father   . Heart disease Father   . Stroke Maternal Uncle   . Hypertension Neg Hx  Social History   Tobacco Use  . Smoking status: Never Smoker  . Smokeless tobacco: Never Used  Vaping Use  . Vaping Use: Never used  Substance Use Topics  . Alcohol use: No  . Drug use: No    Home Medications Prior to Admission medications   Medication Sig Start Date End Date Taking? Authorizing Provider  acetaminophen (TYLENOL) 325 MG tablet Take 650 mg by mouth every 6 (six) hours as needed for mild pain.   Yes [provider]  calcium-vitamin D (OSCAL WITH D) 500-200 MG-UNIT tablet Take 1 tablet by mouth.   Yes [provider]  Cholecalciferol (VITAMIN D-3 PO) Take 1 capsule by mouth daily.   Yes [provider]  felodipine (PLENDIL) 2.5 MG 24  hr tablet Take 2.5 mg by mouth daily.   Yes [provider]  FLUoxetine (PROZAC) 10 MG tablet Take 1 tablet (10 mg total) by mouth every other day. Patient taking differently: Take 10 mg by mouth daily.  01/27/19  Yes Deboraha Sprang, MD  labetalol (NORMODYNE) 200 MG tablet TAKE 1 TABLET EVERY MORNING AND TAKE 1 AND 1/2 TABLETS AT NIGHT [phone]* Patient taking differently: Take 200-250 mg by mouth See admin instructions. TAKE 1 TABLET EVERY MORNING AND TAKE 1 AND 1/2 TABLETS AT NIGHT [phone]* 07/12/20  Yes Deboraha Sprang, MD  meclizine (ANTIVERT) 25 MG tablet Take 25 mg by mouth 3 (three) times daily as needed for dizziness.   Yes [provider]  metoprolol tartrate (LOPRESSOR) 50 MG tablet Take 1 tablet (50 mg total) by mouth 2 (two) times daily. Patient taking differently: Take 25 mg by mouth 2 (two) times daily.  04/12/20  Yes Deboraha Sprang, MD  rivaroxaban (XARELTO) 20 MG TABS tablet Take 1 tablet (20 mg total) by mouth daily with supper. 10/06/19  Yes Deboraha Sprang, MD  spironolactone (ALDACTONE) 25 MG tablet Take 25 mg by mouth daily.   Yes [provider]  valsartan (DIOVAN) 160 MG tablet Take 80 mg by mouth 2 (two) times daily.   Yes [provider]  zolpidem (AMBIEN) 10 MG tablet Take 1/2 tablet by mouth at bedtime for rest Patient taking differently: Take 5-10 mg by mouth at bedtime as needed for sleep.  12/19/17  Yes Lauree Chandler, NP  felodipine (PLENDIL) 5 MG 24 hr tablet Take 1 tablet (5 mg total) by mouth daily. Patient not taking: Reported on 08/03/2020 11/20/19   Deboraha Sprang, MD  KLOR-CON M20 20 MEQ tablet TAKE 1/2 TABLET BY MOUTH ONCE DAILY Patient not taking: Reported on 08/03/2020 10/03/17   Lauree Chandler, NP  meclizine (ANTIVERT) 12.5 MG tablet Take 1 tablet (12.5 mg total) by mouth 3 (three) times daily as needed for dizziness. Patient not taking: Reported on 08/03/2020 12/26/19   Quintella Reichert, MD    oxyCODONE-acetaminophen (PERCOCET) 5-325 MG tablet Take 1-2 tablets by mouth every 6 (six) hours as needed. Patient not taking: Reported on 12/26/2019 04/29/19   Veryl Speak, MD  valsartan (DIOVAN) 80 MG tablet Take 1 tablet (80 mg total) by mouth 2 (two) times daily. take one 80 mg tablet twice daily (no longer taking 160 mg 1/2 tablet twice daily) Thank you. Patient not taking: Reported on 12/26/2019 11/24/19   Deboraha Sprang, MD    Allergies    Hydrochlorothiazide, Penicillins, Ciprofloxacin, Doxycycline, Hydrocodone-acetaminophen, Iodine-131, Latex, Statins, Carbamazepine, Codeine, Sulfamethoxazole, and Sulfonamide derivatives  Review of Systems   Review of Systems  Constitutional: Negative for  fever.  Respiratory: Negative for shortness of breath.   Cardiovascular: Positive for chest pain and palpitations.  Gastrointestinal: Negative for abdominal pain and vomiting.  Genitourinary: Negative for dysuria.  Musculoskeletal: Negative for neck pain.  Neurological: Positive for weakness. Negative for headaches.  All other systems reviewed and are negative.   Physical Exam Updated Vital Signs BP (!) 162/107   Pulse 62   Temp 97.9 F (36.6 C) (Oral)   Resp (!) 24   Ht 5\' 6"  (1.676 m)   Wt 90.7 kg   SpO2 95%   BMI 32.28 kg/m   Physical Exam Vitals and nursing note reviewed.  Constitutional:      General: She is not in acute distress.    Appearance: She is well-developed. She is not ill-appearing or diaphoretic.  HENT:     Head: Normocephalic and atraumatic.     Right Ear: External ear normal.     Left Ear: External ear normal.     Nose: Nose normal.  Eyes:     General:        Right eye: No discharge.        Left eye: No discharge.     Extraocular Movements: Extraocular movements intact.     Pupils: Pupils are equal, round, and reactive to light.  Cardiovascular:     Rate and Rhythm: Normal rate. Rhythm irregular.     Heart sounds: Normal heart sounds.  Pulmonary:      Effort: Pulmonary effort is normal.     Breath sounds: Normal breath sounds.  Abdominal:     Palpations: Abdomen is soft.     Tenderness: There is no abdominal tenderness.  Musculoskeletal:     Cervical back: Normal range of motion and neck supple.  Skin:    General: Skin is warm and dry.  Neurological:     Mental Status: She is alert and oriented to person, place, and time.     Comments: CN 3-12 grossly intact. 5/5 strength in all 4 extremities. Grossly normal sensation. Normal finger to nose.   Psychiatric:        Mood and Affect: Mood is not anxious.     ED Results / Procedures / Treatments   Labs (all labs ordered are listed, but only abnormal results are displayed) Labs Reviewed  BASIC METABOLIC PANEL - Abnormal; Notable for the following components:      Result Value   Glucose, Bld 107 (*)    Creatinine, Ser 1.06 (*)    GFR, Estimated 52 (*)    All other components within normal limits  URINALYSIS, ROUTINE W REFLEX MICROSCOPIC - Abnormal; Notable for the following components:   APPearance HAZY (*)    Leukocytes,Ua MODERATE (*)    Bacteria, UA RARE (*)    All other components within normal limits  RESPIRATORY PANEL BY RT PCR (FLU A&B, COVID)  URINE CULTURE  CBC  CK  TROPONIN I (HIGH SENSITIVITY)  TROPONIN I (HIGH SENSITIVITY)    EKG EKG Interpretation  Date/Time:  Tuesday August 03 2020 08:53:16 EST Ventricular Rate:  85 PR Interval:    QRS Duration: 95 QT Interval:  385 QTC Calculation: 458 R Axis:   -49 Text Interpretation: Atrial fibrillation Left anterior fascicular block Abnormal R-wave progression, late transition LVH with secondary repolarization abnormality similar to April 2021 Confirmed by Sherwood Gambler [id number]) on 08/03/2020 9:07:16 AM Also confirmed by Sherwood Gambler [phone]), editor Hattie Perch [phone])  on 08/03/2020 12:36:03 PM   Radiology DG Chest 2 View  Result Date: 08/03/2020 CLINICAL DATA:  Hypertension EXAM: CHEST - 2 VIEW  COMPARISON:  04/29/2019 FINDINGS: Stable mild cardiomegaly. Atherosclerotic calcification of the aortic knob. No focal airspace consolidation, pleural effusion, or pneumothorax. Degenerative changes of the shoulders, right worse than left. IMPRESSION: No active cardiopulmonary disease. Electronically Signed   By: Davina Poke D.O.   On: 08/03/2020 09:34   CT Head Wo Contrast  Result Date: 08/03/2020 CLINICAL DATA:  Cerebral hemorrhage suspected. Additional history provided: Altered mental status, hypertension and possible transient hallucinations, weakness. EXAM: CT HEAD WITHOUT CONTRAST TECHNIQUE: Contiguous axial images were obtained from the base of the skull through the vertex without intravenous contrast. COMPARISON:  Head CT 12/26/2019 FINDINGS: Brain: Mild for age generalized parenchymal atrophy. Moderate patchy and ill-defined hypoattenuation within the cerebral white matter is nonspecific, but compatible with chronic small vessel ischemic disease. There is no acute intracranial hemorrhage. No demarcated cortical infarct. No extra-axial fluid collection. No evidence of intracranial mass. No midline shift. Vascular: No hyperdense vessel.  Atherosclerotic calcifications. Skull: Normal. Negative for fracture or focal lesion. Unchanged prominent dural-based calcification overlying the left frontal lobe Sinuses/Orbits: Visualized orbits show no acute finding. Chronic extensive opacification of the left maxillary sinus with associated chronic reactive osteitis. No more than mild paranasal sinus mucosal thickening elsewhere. IMPRESSION: No evidence of acute intracranial abnormality. Mild cerebral atrophy and moderate chronic small vessel ischemic disease, stable as compared to the head CT of 12/26/2019. Chronic left maxillary sinusitis. Electronically Signed   By: Kellie Simmering DO   On: 08/03/2020 11:17    Procedures Procedures (including critical care time)  Medications Ordered in ED Medications    aspirin chewable tablet 324 mg (324 mg Oral Given 08/03/20 0949)  sodium chloride 0.9 % bolus 500 mL (0 mLs Intravenous Stopped 08/03/20 1046)    ED Course  I have reviewed the triage vital signs and the nursing notes.  Pertinent labs & imaging results that were available during my care of the patient were reviewed by me and considered in my medical decision making (see chart for details).    MDM Rules/Calculators/A&P                          Patient has multiple nonspecific complaints.  ECG without acute ischemia.  It seems like she is always in atrial fibrillation.  No hypotension.  Does not seem to be consistent with ACS.  No shortness of breath.  Labs are relatively benign compared to baseline.  Her UA shows some leukocytes but she has no UTI symptoms.  Will send for culture.  CT head benign.  I have personally reviewed CT head/CXR. Otherwise with a small bolus of fluid she is feeling better.  She has been ambulatory.  Will discharge home. Final Clinical Impression(s) / ED Diagnoses Final diagnoses:  Generalized weakness    Rx / DC Orders ED Discharge Orders    None       Sherwood Gambler, MD 08/03/20 1346    Sherwood Gambler, MD 08/03/20 1346

## 2020-08-04 LAB — URINE CULTURE: Culture: <10000 — AB

## 2020-08-06 ENCOUNTER — Other Ambulatory Visit: Payer: Self-pay | Admitting: Internal Medicine

## 2020-08-09 NOTE — Telephone Encounter (Signed)
Pt last saw Dr Caryl Comes 11/20/19 telemedicine Covid-19, last labs 08/03/20 Creat 1.06, age 86, weight 90.7kg, CrCl 56.57, based on CrCl pt is on appropriate dosage of Xarelto 20mg  QD.  Will refill rx.

## 2020-08-20 ENCOUNTER — Other Ambulatory Visit: Payer: Self-pay

## 2020-08-20 MED ORDER — LABETALOL HCL 200 MG PO TABS
ORAL_TABLET | ORAL | 0 refills | Status: DC
Start: 2020-08-20 — End: 2022-07-14

## 2020-11-23 ENCOUNTER — Other Ambulatory Visit: Payer: Self-pay

## 2020-11-23 MED ORDER — FELODIPINE ER 5 MG PO TB24
5.0000 mg | ORAL_TABLET | Freq: Every day | ORAL | 1 refills | Status: DC
Start: 2020-11-23 — End: 2021-01-12

## 2021-01-12 ENCOUNTER — Other Ambulatory Visit: Payer: Self-pay | Admitting: *Deleted

## 2021-01-12 MED ORDER — FELODIPINE ER 5 MG PO TB24
5.0000 mg | ORAL_TABLET | Freq: Every day | ORAL | 0 refills | Status: DC
Start: 2021-01-12 — End: 2021-01-31

## 2021-01-14 ENCOUNTER — Telehealth: Payer: Self-pay | Admitting: Internal Medicine

## 2021-01-14 NOTE — Telephone Encounter (Signed)
I placed call to patient but phone was busy

## 2021-01-14 NOTE — Telephone Encounter (Signed)
Patient called in to say that the nurse form her PCP office  came out to check her medication recommend that the patient stop taking felodipine (PLENDIL) 5 MG 24 hr tablet but patient doesn't understand why she would tell her that being that she not her heart care dr. Patient called in to make sure the Dr. Caryl Comes wants her to continue to take that medication. Please advise

## 2021-01-14 NOTE — Telephone Encounter (Signed)
I spoke with patient. She reports someone came out to her house from Dr Raul Del office recently and went over her medications.  Patient states she then received call and was told to stop felodipine.  She does not know why. States she is feeling fine and BP is OK.  She does not want to stop. I asked her to follow up with PCP to see why it was recommended she stop it.  Patient reports she is seeing Dr Brigitte Pulse next week.  Patient aware of appointment with Dr Caryl Comes on 03/31/21

## 2021-01-24 ENCOUNTER — Other Ambulatory Visit: Payer: Self-pay | Admitting: Internal Medicine

## 2021-01-24 DIAGNOSIS — G3184 Mild cognitive impairment, so stated: Secondary | ICD-10-CM

## 2021-01-31 ENCOUNTER — Other Ambulatory Visit: Payer: Self-pay

## 2021-01-31 MED ORDER — FELODIPINE ER 5 MG PO TB24
5.0000 mg | ORAL_TABLET | Freq: Every day | ORAL | 1 refills | Status: DC
Start: 2021-01-31 — End: 2021-04-05

## 2021-02-16 ENCOUNTER — Emergency Department (HOSPITAL_BASED_OUTPATIENT_CLINIC_OR_DEPARTMENT_OTHER)
Admit: 2021-02-16 | Discharge: 2021-02-16 | Disposition: A | Discharge status: Home / Self Care | Payer: Medicare Other | Attending: Emergency Medicine | Admitting: Emergency Medicine

## 2021-02-16 ENCOUNTER — Emergency Department (HOSPITAL_COMMUNITY)
Admission: EM | Admit: 2021-02-16 | Discharge: 2021-02-16 | Disposition: A | Discharge status: Home / Self Care | Payer: Medicare Other | Attending: Emergency Medicine | Admitting: Emergency Medicine

## 2021-02-16 DIAGNOSIS — M79604 Pain in right leg: Secondary | ICD-10-CM | POA: Insufficient documentation

## 2021-02-16 DIAGNOSIS — Z7901 Long term (current) use of anticoagulants: Secondary | ICD-10-CM | POA: Insufficient documentation

## 2021-02-16 DIAGNOSIS — M7989 Other specified soft tissue disorders: Secondary | ICD-10-CM | POA: Diagnosis not present

## 2021-02-16 DIAGNOSIS — I10 Essential (primary) hypertension: Secondary | ICD-10-CM | POA: Insufficient documentation

## 2021-02-16 DIAGNOSIS — Z8673 Personal history of transient ischemic attack (TIA), and cerebral infarction without residual deficits: Secondary | ICD-10-CM | POA: Diagnosis not present

## 2021-02-16 DIAGNOSIS — Z8542 Personal history of malignant neoplasm of other parts of uterus: Secondary | ICD-10-CM | POA: Diagnosis not present

## 2021-02-16 DIAGNOSIS — Z79899 Other long term (current) drug therapy: Secondary | ICD-10-CM | POA: Diagnosis not present

## 2021-02-16 DIAGNOSIS — Z91040 Latex allergy status: Secondary | ICD-10-CM | POA: Diagnosis not present

## 2021-02-16 LAB — BASIC METABOLIC PANEL
Anion gap: 8 (ref 5–15)
BUN: 19 mg/dL (ref 8–23)
CO2: 24 mmol/L (ref 22–32)
Calcium: 9.4 mg/dL (ref 8.9–10.3)
Chloride: 105 mmol/L (ref 98–111)
Creatinine, Ser: 1.21 mg/dL — ABNORMAL HIGH (ref 0.44–1.00)
GFR, Estimated: 44 mL/min — ABNORMAL LOW (ref >60)
Glucose, Bld: 104 mg/dL — ABNORMAL HIGH (ref 70–99)
Potassium: 4 mmol/L (ref 3.5–5.1)
Sodium: 137 mmol/L (ref 135–145)

## 2021-02-16 LAB — CBC
HCT: 39.6 % (ref 36.0–46.0)
Hemoglobin: 13 g/dL (ref 12.0–15.0)
MCH: 31 pg (ref 26.0–34.0)
MCHC: 32.8 g/dL (ref 30.0–36.0)
MCV: 94.3 fL (ref 80.0–100.0)
Platelets: 189 10*3/uL (ref 150–400)
RBC: 4.2 MIL/uL (ref 3.87–5.11)
RDW: 13.1 % (ref 11.5–15.5)
WBC: 6.5 10*3/uL (ref 4.0–10.5)
nRBC: 0 % (ref 0.0–0.2)

## 2021-02-16 LAB — CK: Total CK: 71 U/L (ref 38–234)

## 2021-02-16 NOTE — ED Provider Notes (Signed)
[facility] EMERGENCY DEPARTMENT Provider Note   CSN: [id number] Arrival date & time: 02/16/21  2841     History No chief complaint on file.   Raine Blodgett is a 87 y.o. female.  She is here with a complaint of right leg pain going from the calf and radiating up into her thigh that woke her up around 4 AM.  She said she has had this before on and off for few years.  She does not know why it happens.  Sometimes it also happens in her left leg.  Not associate with any swelling numbness or weakness.  It does not sound like she has mentioned this to her doctor.  No trauma.  No back pain.  The history is provided by the patient.  Leg Pain Location:  Leg Time since incident:  10 hours Injury: no   Leg location:  R lower leg Pain details:    Quality:  Aching and cramping   Radiates to:  R leg   Severity:  Moderate   Onset quality:  Sudden   Timing:  Constant   Progression:  Resolved Chronicity:  Recurrent Dislocation: no   Prior injury to area:  No Relieved by:  None tried Worsened by:  Nothing Ineffective treatments:  None tried Associated symptoms: no back pain, no decreased ROM, no fever, no muscle weakness, no numbness, no swelling and no tingling        Past Medical History:  Diagnosis Date  . Anxiety   . Arthritis   . Bulging of cervical intervertebral disc   . Cardiomegaly   . Cataracts, bilateral   . Chronic neck pain   . History of endometrial cancer   . History of stroke    2015 and 2016 // carotid US 8/16: Bilateral 1-39 // carotid US 2/16: Bilateral ICA 1-39  . HLD (hyperlipidemia)   . Hx of echocardiogram    a. Echo (11/15): Mild LVH, EF 55-60%, normal wall motion, grade 2 diastolic dysfunction, mild LAE, normal RV function, PASP 29 mm Hg // echo 8/16:Mild concentric LVH, EF 55-60, normal wall motion, grade 2 diastolic dysfunction, mild LAE, PASP 39  . Hypertension   . Insomnia   . Lung nodule   . OA (osteoarthritis)   . Pectus excavatum    . Persistent atrial fibrillation ([name])   . Vertigo     Patient Active Problem List   Diagnosis Date Noted  . Headache syndrome 07/02/2017  . Chest pain 05/23/2015  . Essential hypertension   . History of stroke 05/10/2015  . Dizziness 05/10/2015  . Orthostatic hypotension 05/10/2015  . CVA (cerebral infarction) 05/10/2015  . Hypokalemia 09/30/2013  . Vertigo 02/28/2013  . Sinusitis 02/28/2013  . Anxiety 02/28/2013  . PAC (premature atrial contraction) 04/10/2012  . Abnormal EKG 01/19/2012  . Persistent atrial fibrillation ([place]) 02/15/2010    Past Surgical History:  Procedure Laterality Date  . ABDOMINAL HYSTERECTOMY  2011  . CATARACT EXTRACTION, BILATERAL Bilateral 2009  . CESAREAN SECTION  1975  . LAPAROSCOPIC HYSTERECTOMY  2011     OB History   No obstetric history on file.     Family History  Problem Relation Age of Onset  . Dementia Mother   . Heart attack Father   . Heart disease Father   . Stroke Maternal Uncle   . Hypertension Neg Hx     Social History   Tobacco Use  . Smoking status: Never Smoker  . Smokeless tobacco: Never Used  Vaping  Use  . Vaping Use: Never used  Substance Use Topics  . Alcohol use: No  . Drug use: No    Home Medications Prior to Admission medications   Medication Sig Start Date End Date Taking? Authorizing Provider  acetaminophen (TYLENOL) 325 MG tablet Take 650 mg by mouth every 6 (six) hours as needed for mild pain.    [provider]  calcium-vitamin D (OSCAL WITH D) 500-200 MG-UNIT tablet Take 1 tablet by mouth.    [provider]  Cholecalciferol (VITAMIN D-3 PO) Take 1 capsule by mouth daily.    [provider]  felodipine (PLENDIL) 5 MG 24 hr tablet Take 1 tablet (5 mg total) by mouth daily. Please keep upcoming appt in July 2022 with Dr. Caryl Comes before anymore refills. Thank you Final Attempt 01/31/21   Deboraha Sprang, MD  FLUoxetine (PROZAC) 10 MG tablet Take 1 tablet (10 mg total) by  mouth every other day. Patient taking differently: Take 10 mg by mouth daily.  01/27/19   Deboraha Sprang, MD  KLOR-CON M20 20 MEQ tablet TAKE 1/2 TABLET BY MOUTH ONCE DAILY Patient not taking: Reported on 08/03/2020 10/03/17   Lauree Chandler, NP  labetalol (NORMODYNE) 200 MG tablet TAKE 1 TABLET EVERY MORNING AND TAKE 1 AND 1/2 TABLETS AT NIGHT *[address]-01*. Please make yearly appt with Dr. Caryl Comes for March 2022 for future refills. Thank you 1st attempt 08/20/20   Deboraha Sprang, MD  meclizine (ANTIVERT) 12.5 MG tablet Take 1 tablet (12.5 mg total) by mouth 3 (three) times daily as needed for dizziness. Patient not taking: Reported on 08/03/2020 12/26/19   Quintella Reichert, MD  meclizine (ANTIVERT) 25 MG tablet Take 25 mg by mouth 3 (three) times daily as needed for dizziness.    [provider]  metoprolol tartrate (LOPRESSOR) 50 MG tablet Take 1 tablet (50 mg total) by mouth 2 (two) times daily. Patient taking differently: Take 25 mg by mouth 2 (two) times daily.  04/12/20   Deboraha Sprang, MD  oxyCODONE-acetaminophen (PERCOCET) 5-325 MG tablet Take 1-2 tablets by mouth every 6 (six) hours as needed. Patient not taking: Reported on 12/26/2019 04/29/19   Veryl Speak, MD  spironolactone (ALDACTONE) 25 MG tablet Take 25 mg by mouth daily.    [provider]  valsartan (DIOVAN) 160 MG tablet Take 80 mg by mouth 2 (two) times daily.    [provider]  valsartan (DIOVAN) 80 MG tablet Take 1 tablet (80 mg total) by mouth 2 (two) times daily. take one 80 mg tablet twice daily (no longer taking 160 mg 1/2 tablet twice daily) Thank you. Patient not taking: Reported on 12/26/2019 11/24/19   Deboraha Sprang, MD  XARELTO 20 MG TABS tablet TAKE 1 TABLET BY MOUTH EVERY DAY WITH SUPPER 08/09/20   Deboraha Sprang, MD  zolpidem (AMBIEN) 10 MG tablet Take 1/2 tablet by mouth at bedtime for rest Patient taking differently: Take 5-10 mg by mouth at bedtime as needed for sleep.  12/19/17    Lauree Chandler, NP    Allergies    Hydrochlorothiazide, Penicillins, Ciprofloxacin, Doxycycline, Hydrocodone-acetaminophen, Iodine-131, Latex, Statins, Carbamazepine, Codeine, Sulfamethoxazole, and Sulfonamide derivatives  Review of Systems   Review of Systems  Constitutional: Negative for fever.  HENT: Negative for sore throat.   Eyes: Negative for visual disturbance.  Respiratory: Negative for shortness of breath.   Cardiovascular: Negative for chest pain.  Gastrointestinal: Negative for abdominal pain.  Genitourinary: Negative for dysuria.  Musculoskeletal:  Negative for back pain.  Skin: Negative for rash.  Neurological: Negative for headaches.    Physical Exam Updated Vital Signs BP (!) 145/98 (BP Location: Left Arm)   Pulse 82   Temp 97.6 F (36.4 C) (Oral)   Resp 16   SpO2 95%   Physical Exam Vitals and nursing note reviewed.  Constitutional:      General: She is not in acute distress.    Appearance: Normal appearance. She is well-developed.  HENT:     Head: Normocephalic and atraumatic.  Eyes:     Conjunctiva/sclera: Conjunctivae normal.  Cardiovascular:     Rate and Rhythm: Normal rate and regular rhythm.     Heart sounds: No murmur heard.   Pulmonary:     Effort: Pulmonary effort is normal. No respiratory distress.     Breath sounds: Normal breath sounds.  Abdominal:     Palpations: Abdomen is soft.     Tenderness: There is no abdominal tenderness.  Musculoskeletal:        General: No swelling, tenderness, deformity or signs of injury. Normal range of motion.     Cervical back: Neck supple.     Right lower leg: No edema.     Left lower leg: No edema.     Comments: Right lower extremity full range of motion at hip knee and ankle.  DP and PT pulses intact.  No erythema.  No swollen joints.  No open wounds.  Distal motor and sensory intact.  Skin:    General: Skin is warm and dry.  Neurological:     General: No focal deficit present.     Mental  Status: She is alert and oriented to person, place, and time.     Cranial Nerves: No cranial nerve deficit.     Sensory: No sensory deficit.     Motor: No weakness.     ED Results / Procedures / Treatments   Labs (all labs ordered are listed, but only abnormal results are displayed) Labs Reviewed  BASIC METABOLIC PANEL - Abnormal; Notable for the following components:      Result Value   Glucose, Bld 104 (*)    Creatinine, Ser 1.21 (*)    GFR, Estimated 44 (*)    All other components within normal limits  CBC  CK    EKG None  Radiology VAS [place] LOWER EXTREMITY VENOUS (DVT) (ONLY [facility] & [facility])  Result Date: 02/16/2021  Lower Venous DVT Study Patient Name:  AMBRI MILTNER  Date of Exam:   02/16/2021 Medical Rec #: [id number]      Accession #:    [id number] Date of Birth: Feb 12, 1934      Patient Gender: F Patient Age:   59Y Exam Location:  [facility] Procedure:      VAS [place] LOWER EXTREMITY VENOUS (DVT) Referring Phys: [id number] Erlene Quan A MORELLI --------------------------------------------------------------------------------  Indications: Pain, and Swelling.  Comparison Study: No prior study Performing Technologist: Maudry Mayhew MHA, RDMS, RVT, RDCS  Examination Guidelines: A complete evaluation includes B-mode imaging, spectral Doppler, color Doppler, and power Doppler as needed of all accessible portions of each vessel. Bilateral testing is considered an integral part of a complete examination. Limited examinations for reoccurring indications may be performed as noted. The reflux portion of the exam is performed with the patient in reverse Trendelenburg.  +---------+---------------+---------+-----------+----------+--------------+ RIGHT    CompressibilityPhasicitySpontaneityPropertiesThrombus Aging +---------+---------------+---------+-----------+----------+--------------+ CFV      Full           No  Yes                                  +---------+---------------+---------+-----------+----------+--------------+ SFJ      Full                                                        +---------+---------------+---------+-----------+----------+--------------+ FV Prox  Full                                                        +---------+---------------+---------+-----------+----------+--------------+ FV Mid   Full                                                        +---------+---------------+---------+-----------+----------+--------------+ FV DistalFull                                                        +---------+---------------+---------+-----------+----------+--------------+ PFV      Full                                                        +---------+---------------+---------+-----------+----------+--------------+ POP      Full           No       Yes                                 +---------+---------------+---------+-----------+----------+--------------+ PTV      Full                                                        +---------+---------------+---------+-----------+----------+--------------+ PERO     Full                                                        +---------+---------------+---------+-----------+----------+--------------+   +----+---------------+---------+-----------+----------+--------------+ LEFTCompressibilityPhasicitySpontaneityPropertiesThrombus Aging +----+---------------+---------+-----------+----------+--------------+ CFV Full           No       Yes                                 +----+---------------+---------+-----------+----------+--------------+     Summary: RIGHT: - There is no evidence of deep vein thrombosis in the lower  extremity.  - No cystic structure found in the popliteal fossa.  LEFT: - No evidence of common femoral vein obstruction.  Pulsatile lower extremity venous flow is suggestive of possibly elevated right heart pressure.   *See table(s) above for measurements and observations.    Preliminary     Procedures Procedures   Medications Ordered in ED Medications - No data to display  ED Course  I have reviewed the triage vital signs and the nursing notes.  Pertinent labs & imaging results that were available during my care of the patient were reviewed by me and considered in my medical decision making (see chart for details).    MDM Rules/Calculators/A&P                         87 year old female here with longstanding pain in her legs although seems to come and go and not last this long.  She is complaining of more severe pain to her right leg since 4 AM.  Has tried nothing for it.  Differential includes DVT, musculoskeletal, arthritis, cellulitis, vascular thrombosis.  CBC with normal white count normal hemoglobin, chemistries fairly normal other than elevated creatinine but similar to her priors, CK normal.  Right lower extremity duplex ordered and interpreted by me as no acute DVT.  Reviewed all this with patient.  She is concerned of her blood pressure being more elevated.  She has a history of hypertension but does not usually have blood pressures this high.  Recommended close follow-up with her treating physicians.  No evidence of endorgan damage.  Currently has no leg pain.  Final Clinical Impression(s) / ED Diagnoses Final diagnoses:  Right leg pain  Primary hypertension    Rx / DC Orders ED Discharge Orders    None       Hayden Rasmussen, MD 02/16/21 2053

## 2021-02-16 NOTE — Progress Notes (Signed)
Right lower extremity venous duplex completed. Refer to "CV Proc" under chart review to view preliminary results.  02/16/2021 10:24 AM Kelby Aline., MHA, RVT, RDCS, RDMS

## 2021-02-16 NOTE — ED Provider Notes (Signed)
Emergency Medicine Provider Triage Evaluation Note  Tiffany Black , a 87 y.o. female  was evaluated in triage.  Pt complains of atraumatic right leg pain onset 3 weeks ago.  Pain around the right anterior lower leg and includes the calf muscles.  Patient unsure if she has noticed any swelling.  Review of Systems  Positive: Right lower leg pain and questionable swelling Negative: Fall/injury, fever/chills, color change, numbness/tingling, weakness or any additional concerns.  Physical Exam  BP (!) 180/112 (BP Location: Left Arm)   Pulse 74   Temp 98.4 F (36.9 C)   Resp 18   SpO2 98%  Gen:   Awake, no distress   Resp:  Normal effort MSK:   Moves extremities without difficulty.  No pain with motion at the right knee or right ankle.  Tenderness to palpation of the muscles of the right lower leg.  Compartments are soft.  Pedal pulses intact.  Sensation and capillary refill intact to toes.  Medical Decision Making  Medically screening exam initiated at 9:19 AM.  Appropriate orders placed.  Zenith Kercheval was informed that the remainder of the evaluation will be completed by another provider, this initial triage assessment does not replace that evaluation, and the importance of remaining in the ED until their evaluation is complete.    Note: Portions of this report may have been transcribed using voice recognition software. Every effort was made to ensure accuracy; however, inadvertent computerized transcription errors may still be present.    Gari Crown 02/16/21 0920    Hayden Rasmussen, MD 02/16/21 [phone]

## 2021-02-16 NOTE — ED Triage Notes (Signed)
Patient complains of right leg pain x 2 weeks. States that the pain started in calf and now in upper leg. Denies trauma. Denies swelling

## 2021-02-16 NOTE — Discharge Instructions (Signed)
You were seen in the emergency department for right calf pain.  You had lab work and an ultrasound that did not show any obvious explanation for your pain.  There was no evidence of a blood clot.  You have good peripheral pulses.  Please contact your primary care doctor for follow-up.  Return to the emergency department if any worsening or concerning symptoms.  Your blood pressure was elevated here and this will need to be rechecked with your doctors.  Please continue your current medications.

## 2021-02-17 ENCOUNTER — Other Ambulatory Visit: Payer: Self-pay

## 2021-02-17 ENCOUNTER — Emergency Department (HOSPITAL_COMMUNITY): Payer: Medicare Other

## 2021-02-17 ENCOUNTER — Emergency Department (HOSPITAL_COMMUNITY)
Admission: EM | Admit: 2021-02-17 | Discharge: 2021-02-17 | Disposition: A | Discharge status: Home / Self Care | Payer: Medicare Other | Attending: Emergency Medicine | Admitting: Emergency Medicine

## 2021-02-17 DIAGNOSIS — S32019A Unspecified fracture of first lumbar vertebra, initial encounter for closed fracture: Secondary | ICD-10-CM | POA: Insufficient documentation

## 2021-02-17 DIAGNOSIS — Z8542 Personal history of malignant neoplasm of other parts of uterus: Secondary | ICD-10-CM | POA: Diagnosis not present

## 2021-02-17 DIAGNOSIS — S34101A Unspecified injury to L1 level of lumbar spinal cord, initial encounter: Secondary | ICD-10-CM | POA: Diagnosis present

## 2021-02-17 DIAGNOSIS — S32029A Unspecified fracture of second lumbar vertebra, initial encounter for closed fracture: Secondary | ICD-10-CM | POA: Insufficient documentation

## 2021-02-17 DIAGNOSIS — S32009A Unspecified fracture of unspecified lumbar vertebra, initial encounter for closed fracture: Secondary | ICD-10-CM

## 2021-02-17 DIAGNOSIS — Z79899 Other long term (current) drug therapy: Secondary | ICD-10-CM | POA: Insufficient documentation

## 2021-02-17 DIAGNOSIS — I10 Essential (primary) hypertension: Secondary | ICD-10-CM | POA: Insufficient documentation

## 2021-02-17 DIAGNOSIS — I4819 Other persistent atrial fibrillation: Secondary | ICD-10-CM | POA: Insufficient documentation

## 2021-02-17 DIAGNOSIS — Z91040 Latex allergy status: Secondary | ICD-10-CM | POA: Diagnosis not present

## 2021-02-17 DIAGNOSIS — Z7901 Long term (current) use of anticoagulants: Secondary | ICD-10-CM | POA: Insufficient documentation

## 2021-02-17 DIAGNOSIS — W19XXXA Unspecified fall, initial encounter: Secondary | ICD-10-CM | POA: Diagnosis not present

## 2021-02-17 DIAGNOSIS — S2241XA Multiple fractures of ribs, right side, initial encounter for closed fracture: Secondary | ICD-10-CM

## 2021-02-17 MED ORDER — LABETALOL HCL 100 MG PO TABS
100.0000 mg | ORAL_TABLET | Freq: Once | ORAL | Status: DC
  Filled 2021-02-17: qty 1

## 2021-02-17 MED ORDER — TRAMADOL HCL 50 MG PO TABS
50.0000 mg | ORAL_TABLET | Freq: Four times a day (QID) | ORAL | 0 refills | Status: DC | PRN
Start: 2021-02-17 — End: 2022-01-16

## 2021-02-17 MED ORDER — TRAMADOL HCL 50 MG PO TABS
50.0000 mg | ORAL_TABLET | Freq: Once | ORAL | Status: DC
  Filled 2021-02-17: qty 1

## 2021-02-17 NOTE — ED Triage Notes (Signed)
Pt presents from home via [facility], pt states she fell this morning and c/o lower back pain. [facility] and discharged after being evaluated to R leg pain and states she is having difficulty with walking and that is what caused her to fall. Pt denies hitting head or other injury.   Pt states she did not have any of her medications yesterday but did take an [place] before going to bed

## 2021-02-17 NOTE — Discharge Instructions (Addendum)
Begin taking tramadol as prescribed.  Follow-up with your primary doctor if symptoms are not improving in the next week.

## 2021-02-17 NOTE — ED Provider Notes (Signed)
[facility] DEPT Provider Note   CSN: [id number] Arrival date & time: 02/17/21  2836     History Chief Complaint  Patient presents with   Lytle Michaels    Tiffany Black is a 87 y.o. female.  Patient is an 87 year old female with past medical history of hypertension, atrial fibrillation, prior CVA.  Patient presenting today with complaints of low back pain from a fall.  She reports that her left leg has been intermittently weak and believes that this may have what caused her to fall this evening.  She was seen here yesterday with similar complaints.  She had laboratory studies and an ultrasound which showed no evidence for DVT.  She is having pain to her lumbar region since the fall she reports yesterday, but no weakness or numbness presently in her legs.  She denies any bowel or bladder complaints.  The history is provided by the patient.  Fall This is a new problem. The current episode started 3 to 5 hours ago. The problem occurs constantly. The problem has not changed since onset.Nothing aggravates the symptoms. Nothing relieves the symptoms. She has tried nothing for the symptoms.      Past Medical History:  Diagnosis Date   Anxiety    Arthritis    Bulging of cervical intervertebral disc    Cardiomegaly    Cataracts, bilateral    Chronic neck pain    History of endometrial cancer    History of stroke    2015 and 2016 // carotid US 8/16: Bilateral 1-39 // carotid US 2/16: Bilateral ICA 1-39   HLD (hyperlipidemia)    Hx of echocardiogram    a. Echo (11/15): Mild LVH, EF 55-60%, normal wall motion, grade 2 diastolic dysfunction, mild LAE, normal RV function, PASP 29 mm Hg // echo 8/16:Mild concentric LVH, EF 55-60, normal wall motion, grade 2 diastolic dysfunction, mild LAE, PASP 39   Hypertension    Insomnia    Lung nodule    OA (osteoarthritis)    Pectus excavatum    Persistent atrial fibrillation ([place])    Vertigo     Patient Active Problem List    Diagnosis Date Noted   Headache syndrome 07/02/2017   Chest pain 05/23/2015   Essential hypertension    History of stroke 05/10/2015   Dizziness 05/10/2015   Orthostatic hypotension 05/10/2015   CVA (cerebral infarction) 05/10/2015   Hypokalemia 09/30/2013   Vertigo 02/28/2013   Sinusitis 02/28/2013   Anxiety 02/28/2013   PAC (premature atrial contraction) 04/10/2012   Abnormal EKG 01/19/2012   Persistent atrial fibrillation ([place]) 02/15/2010    Past Surgical History:  Procedure Laterality Date   ABDOMINAL HYSTERECTOMY  2011   CATARACT EXTRACTION, BILATERAL Bilateral 2009   CESAREAN SECTION  1975   LAPAROSCOPIC HYSTERECTOMY  2011     OB History   No obstetric history on file.     Family History  Problem Relation Age of Onset   Dementia Mother    Heart attack Father    Heart disease Father    Stroke Maternal Uncle    Hypertension Neg Hx     Social History   Tobacco Use   Smoking status: Never   Smokeless tobacco: Never  Vaping Use   Vaping Use: Never used  Substance Use Topics   Alcohol use: No   Drug use: No    Home Medications Prior to Admission medications   Medication Sig Start Date End Date Taking? Authorizing Provider  acetaminophen (TYLENOL) 325 MG  tablet Take 650 mg by mouth every 6 (six) hours as needed for mild pain.    [provider]  calcium-vitamin D (OSCAL WITH D) 500-200 MG-UNIT tablet Take 1 tablet by mouth.    [provider]  Cholecalciferol (VITAMIN D-3 PO) Take 1 capsule by mouth daily.    [provider]  felodipine (PLENDIL) 5 MG 24 hr tablet Take 1 tablet (5 mg total) by mouth daily. Please keep upcoming appt in July 2022 with Dr. Caryl Comes before anymore refills. Thank you Final Attempt 01/31/21   Deboraha Sprang, MD  FLUoxetine (PROZAC) 10 MG tablet Take 1 tablet (10 mg total) by mouth every other day. Patient taking differently: Take 10 mg by mouth daily.  01/27/19   Deboraha Sprang, MD  KLOR-CON M20 20 MEQ  tablet TAKE 1/2 TABLET BY MOUTH ONCE DAILY Patient not taking: Reported on 08/03/2020 10/03/17   Lauree Chandler, NP  labetalol (NORMODYNE) 200 MG tablet TAKE 1 TABLET EVERY MORNING AND TAKE 1 AND 1/2 TABLETS AT NIGHT *[address]-01*. Please make yearly appt with Dr. Caryl Comes for March 2022 for future refills. Thank you 1st attempt 08/20/20   Deboraha Sprang, MD  meclizine (ANTIVERT) 12.5 MG tablet Take 1 tablet (12.5 mg total) by mouth 3 (three) times daily as needed for dizziness. Patient not taking: Reported on 08/03/2020 12/26/19   Quintella Reichert, MD  meclizine (ANTIVERT) 25 MG tablet Take 25 mg by mouth 3 (three) times daily as needed for dizziness.    [provider]  metoprolol tartrate (LOPRESSOR) 50 MG tablet Take 1 tablet (50 mg total) by mouth 2 (two) times daily. Patient taking differently: Take 25 mg by mouth 2 (two) times daily.  04/12/20   Deboraha Sprang, MD  oxyCODONE-acetaminophen (PERCOCET) 5-325 MG tablet Take 1-2 tablets by mouth every 6 (six) hours as needed. Patient not taking: Reported on 12/26/2019 04/29/19   Veryl Speak, MD  spironolactone (ALDACTONE) 25 MG tablet Take 25 mg by mouth daily.    [provider]  valsartan (DIOVAN) 160 MG tablet Take 80 mg by mouth 2 (two) times daily.    [provider]  valsartan (DIOVAN) 80 MG tablet Take 1 tablet (80 mg total) by mouth 2 (two) times daily. take one 80 mg tablet twice daily (no longer taking 160 mg 1/2 tablet twice daily) Thank you. Patient not taking: Reported on 12/26/2019 11/24/19   Deboraha Sprang, MD  XARELTO 20 MG TABS tablet TAKE 1 TABLET BY MOUTH EVERY DAY WITH SUPPER 08/09/20   Deboraha Sprang, MD  zolpidem (AMBIEN) 10 MG tablet Take 1/2 tablet by mouth at bedtime for rest Patient taking differently: Take 5-10 mg by mouth at bedtime as needed for sleep.  12/19/17   Lauree Chandler, NP    Allergies    Hydrochlorothiazide, Penicillins, Ciprofloxacin, Doxycycline, Hydrocodone-acetaminophen,  Iodine-131, Latex, Statins, Carbamazepine, Codeine, Sulfamethoxazole, and Sulfonamide derivatives  Review of Systems   Review of Systems  All other systems reviewed and are negative.  Physical Exam Updated Vital Signs BP (!) 173/115 (BP Location: Left Arm) Comment: triage RN aware  Pulse 77   Temp 98.2 F (36.8 C) (Oral)   Resp 16   Ht 5\' 5"  (1.651 m)   Wt 79.4 kg   SpO2 95%   BMI 29.12 kg/m   Physical Exam Vitals and nursing note reviewed.  Constitutional:      General: She is not in acute distress.    Appearance: She is well-developed.  She is not diaphoretic.  HENT:     Head: Normocephalic and atraumatic.  Cardiovascular:     Rate and Rhythm: Normal rate and regular rhythm.     Heart sounds: No murmur heard.   No friction rub. No gallop.  Pulmonary:     Effort: Pulmonary effort is normal. No respiratory distress.     Breath sounds: Normal breath sounds. No wheezing.  Abdominal:     General: Bowel sounds are normal. There is no distension.     Palpations: Abdomen is soft.     Tenderness: There is no abdominal tenderness.  Musculoskeletal:        General: Normal range of motion.     Cervical back: Normal range of motion and neck supple.     Comments: Inspection of the lumbar region reveals no obvious abnormality.  There is no ecchymosis or contusion.  There is tenderness in the lumbar soft tissues, but no bony tenderness or step-off.  Skin:    General: Skin is warm and dry.  Neurological:     General: No focal deficit present.     Mental Status: She is alert and oriented to person, place, and time.     Comments: Strength is 5 out of 5 in both lower extremities.  Sensation is intact throughout both lower extremities.    ED Results / Procedures / Treatments   Labs (all labs ordered are listed, but only abnormal results are displayed) Labs Reviewed - No data to display  EKG None  Radiology VAS [place] LOWER EXTREMITY VENOUS (DVT) (ONLY [facility] & [facility])  Result Date:  02/16/2021  Lower Venous DVT Study Patient Name:  Tiffany Black  Date of Exam:   02/16/2021 Medical Rec #: [id number]      Accession #:    [id number] Date of Birth: Jul 02, 1934      Patient Gender: F Patient Age:   56Y Exam Location:  [facility] Procedure:      VAS [place] LOWER EXTREMITY VENOUS (DVT) Referring Phys: [id number] Erlene Quan A MORELLI --------------------------------------------------------------------------------  Indications: Pain, and Swelling.  Comparison Study: No prior study Performing Technologist: Maudry Mayhew MHA, RDMS, RVT, RDCS  Examination Guidelines: A complete evaluation includes B-mode imaging, spectral Doppler, color Doppler, and power Doppler as needed of all accessible portions of each vessel. Bilateral testing is considered an integral part of a complete examination. Limited examinations for reoccurring indications may be performed as noted. The reflux portion of the exam is performed with the patient in reverse Trendelenburg.  +---------+---------------+---------+-----------+----------+--------------+ RIGHT    CompressibilityPhasicitySpontaneityPropertiesThrombus Aging +---------+---------------+---------+-----------+----------+--------------+ CFV      Full           No       Yes                                 +---------+---------------+---------+-----------+----------+--------------+ SFJ      Full                                                        +---------+---------------+---------+-----------+----------+--------------+ FV Prox  Full                                                        +---------+---------------+---------+-----------+----------+--------------+  FV Mid   Full                                                        +---------+---------------+---------+-----------+----------+--------------+ FV DistalFull                                                         +---------+---------------+---------+-----------+----------+--------------+ PFV      Full                                                        +---------+---------------+---------+-----------+----------+--------------+ POP      Full           No       Yes                                 +---------+---------------+---------+-----------+----------+--------------+ PTV      Full                                                        +---------+---------------+---------+-----------+----------+--------------+ PERO     Full                                                        +---------+---------------+---------+-----------+----------+--------------+   +----+---------------+---------+-----------+----------+--------------+ LEFTCompressibilityPhasicitySpontaneityPropertiesThrombus Aging +----+---------------+---------+-----------+----------+--------------+ CFV Full           No       Yes                                 +----+---------------+---------+-----------+----------+--------------+     Summary: RIGHT: - There is no evidence of deep vein thrombosis in the lower extremity.  - No cystic structure found in the popliteal fossa.  LEFT: - No evidence of common femoral vein obstruction.  Pulsatile lower extremity venous flow is suggestive of possibly elevated right heart pressure.  *See table(s) above for measurements and observations. Electronically signed by Jamelle Haring on 02/16/2021 at 7:16:07 PM.    Final     Procedures Procedures   Medications Ordered in ED Medications - No data to display  ED Course  I have reviewed the triage vital signs and the nursing notes.  Pertinent labs & imaging results that were available during my care of the patient were reviewed by me and considered in my medical decision making (see chart for details).    MDM Rules/Calculators/A&P  Patient presenting here with complaints of pain in her back.  She apparently fell yesterday after  being discharged from the ER where she was seen with leg pain.  A  CT of the lumbar spine was obtained to evaluate the extent of her injuries.  Patient does have L1 and L2 right-sided transverse process fractures along with a right 11th and 12th rib fractures which are nondisplaced.  Patient's laboratory studies from yesterday were reviewed and not felt to need to be repeated.  At this point discharge seems appropriate.  She will be given tramadol which she can take for pain.  Her blood pressure here is elevated, likely related to pain and the fact that she missed her evening doses of her antihypertensives.  Patient will be given a dose of her labetalol prior to discharge.  I suspect her blood pressure will improve when her pain is improving.  Final Clinical Impression(s) / ED Diagnoses Final diagnoses:  None    Rx / DC Orders ED Discharge Orders     None        Veryl Speak, MD 02/17/21 [phone]

## 2021-02-17 NOTE — ED Notes (Signed)
Assisted pt to bedside commode. Tolerated well.

## 2021-03-23 ENCOUNTER — Emergency Department (HOSPITAL_COMMUNITY): Payer: Medicare Other

## 2021-03-23 ENCOUNTER — Emergency Department (HOSPITAL_COMMUNITY)
Admission: EM | Admit: 2021-03-23 | Discharge: 2021-03-23 | Disposition: A | Discharge status: Home / Self Care | Payer: Medicare Other | Attending: Emergency Medicine | Admitting: Emergency Medicine

## 2021-03-23 DIAGNOSIS — Z7901 Long term (current) use of anticoagulants: Secondary | ICD-10-CM | POA: Insufficient documentation

## 2021-03-23 DIAGNOSIS — Z79899 Other long term (current) drug therapy: Secondary | ICD-10-CM | POA: Insufficient documentation

## 2021-03-23 DIAGNOSIS — Y92009 Unspecified place in unspecified non-institutional (private) residence as the place of occurrence of the external cause: Secondary | ICD-10-CM | POA: Insufficient documentation

## 2021-03-23 DIAGNOSIS — I4891 Unspecified atrial fibrillation: Secondary | ICD-10-CM | POA: Diagnosis not present

## 2021-03-23 DIAGNOSIS — I10 Essential (primary) hypertension: Secondary | ICD-10-CM | POA: Diagnosis not present

## 2021-03-23 DIAGNOSIS — W19XXXA Unspecified fall, initial encounter: Secondary | ICD-10-CM

## 2021-03-23 DIAGNOSIS — S0990XA Unspecified injury of head, initial encounter: Secondary | ICD-10-CM | POA: Diagnosis present

## 2021-03-23 DIAGNOSIS — W01198A Fall on same level from slipping, tripping and stumbling with subsequent striking against other object, initial encounter: Secondary | ICD-10-CM | POA: Insufficient documentation

## 2021-03-23 DIAGNOSIS — S0003XA Contusion of scalp, initial encounter: Secondary | ICD-10-CM | POA: Insufficient documentation

## 2021-03-23 LAB — URINALYSIS, ROUTINE W REFLEX MICROSCOPIC
Bilirubin Urine: NEGATIVE
Glucose, UA: NEGATIVE mg/dL
Hgb urine dipstick: NEGATIVE
Ketones, ur: NEGATIVE mg/dL
Nitrite: NEGATIVE
Protein, ur: NEGATIVE mg/dL
Specific Gravity, Urine: 1.006 (ref 1.005–1.030)
pH: 6 (ref 5.0–8.0)

## 2021-03-23 LAB — BASIC METABOLIC PANEL
Anion gap: 8 (ref 5–15)
BUN: 9 mg/dL (ref 8–23)
CO2: 26 mmol/L (ref 22–32)
Calcium: 9.5 mg/dL (ref 8.9–10.3)
Chloride: 105 mmol/L (ref 98–111)
Creatinine, Ser: 1.21 mg/dL — ABNORMAL HIGH (ref 0.44–1.00)
GFR, Estimated: 44 mL/min — ABNORMAL LOW (ref >60)
Glucose, Bld: 103 mg/dL — ABNORMAL HIGH (ref 70–99)
Potassium: 3.5 mmol/L (ref 3.5–5.1)
Sodium: 139 mmol/L (ref 135–145)

## 2021-03-23 LAB — CBC WITH DIFFERENTIAL/PLATELET
Abs Immature Granulocytes: 0.02 10*3/uL (ref 0.00–0.07)
Basophils Absolute: 0.1 10*3/uL (ref 0.0–0.1)
Basophils Relative: 1 %
Eosinophils Absolute: 0.4 10*3/uL (ref 0.0–0.5)
Eosinophils Relative: 6 %
HCT: 39.2 % (ref 36.0–46.0)
Hemoglobin: 12.8 g/dL (ref 12.0–15.0)
Immature Granulocytes: 0 %
Lymphocytes Relative: 16 %
Lymphs Abs: 1 10*3/uL (ref 0.7–4.0)
MCH: 31 pg (ref 26.0–34.0)
MCHC: 32.7 g/dL (ref 30.0–36.0)
MCV: 94.9 fL (ref 80.0–100.0)
Monocytes Absolute: 0.5 10*3/uL (ref 0.1–1.0)
Monocytes Relative: 8 %
Neutro Abs: 4.2 10*3/uL (ref 1.7–7.7)
Neutrophils Relative %: 69 %
Platelets: 185 10*3/uL (ref 150–400)
RBC: 4.13 MIL/uL (ref 3.87–5.11)
RDW: 13.2 % (ref 11.5–15.5)
WBC: 6.1 10*3/uL (ref 4.0–10.5)
nRBC: 0 % (ref 0.0–0.2)

## 2021-03-23 MED ORDER — SODIUM CHLORIDE 0.9 % IV BOLUS
500.0000 mL | Freq: Once | INTRAVENOUS | Status: AC
  Administered 2021-03-23: 500 mL via INTRAVENOUS

## 2021-03-23 NOTE — Progress Notes (Signed)
Orthopedic Tech Progress Note Patient Details:  Eller Sweis October 14, 1933 [id number]  Level 2 trauma  Patient ID: [name], female   DOB: July 09, 1934, 87 y.o.   MRN: [id number]  Janit Pagan 03/23/2021, 9:06 AM

## 2021-03-23 NOTE — ED Triage Notes (Signed)
Patient with increasing dizziness x 1 week, uncertain whether fell in bedroom or bathroom per patient report. Did fall backward and hit back of head with hematoma noted to back of head, patient on xeralto. Found ambulatory on scene by medic. Splinted first 3 fingers of right hand for possible sprain/fracture.

## 2021-03-23 NOTE — ED Provider Notes (Signed)
[facility] EMERGENCY DEPARTMENT Provider Note   CSN: [id number] Arrival date & time: 03/23/21  9622     History Chief Complaint  Patient presents with   Tiffany Black is a 87 y.o. female.  Patient with history of A. fib on Xarelto.  History of stroke and vertigo.  Presents to ED after she had a fall.  Hit the back of her head.  Not sure if it was mechanical or if she was having some dizziness at the time.  She has intermittent dizziness at times.  Does not have any chest pain, shortness of breath, dizziness or weakness at this time.  Denies any nausea, vomiting, diarrhea.  Has a bump to the back of her head.  Right hand was placed in a splint by EMS but she is not having any right hand pain.  The history is provided by the patient.  Fall This is a new problem. The current episode started less than 1 hour ago. The problem has been resolved. Associated symptoms include headaches. Pertinent negatives include no chest pain, no abdominal pain and no shortness of breath. Nothing aggravates the symptoms. Nothing relieves the symptoms. She has tried nothing for the symptoms. The treatment provided no relief.      No past medical history on file.  There are no problems to display for this patient.   HTN, afib, stroke   OB History   No obstetric history on file.     No family history on file.     Home Medications Prior to Admission medications   Medication Sig Start Date End Date Taking? Authorizing Provider  acetaminophen (TYLENOL) 500 MG tablet Take 500 mg by mouth every 6 (six) hours as needed for mild pain or headache.   Yes [provider]  DIOVAN 160 MG tablet Take 80 mg by mouth 2 (two) times daily. 02/16/21  Yes [provider]  felodipine (PLENDIL) 5 MG 24 hr tablet Take 5 mg by mouth daily. 02/04/21  Yes [provider]  FLUoxetine (PROZAC) 10 MG capsule Take 10 mg by mouth daily. 01/14/21  Yes [provider]   labetalol (NORMODYNE) 200 MG tablet Take 200-300 mg by mouth in the morning and at bedtime. 200mg  in the morning and 300mg  at bedtime 02/05/21  Yes [provider]  metoprolol tartrate (LOPRESSOR) 50 MG tablet Take 25 mg by mouth 2 (two) times daily. 12/22/20  Yes [provider]  potassium chloride SA (KLOR-CON) 20 MEQ tablet Take 10 mEq by mouth daily.   Yes [provider]  XARELTO 20 MG TABS tablet Take 20 mg by mouth daily. 03/08/21  Yes [provider]  zolpidem (AMBIEN) 10 MG tablet Take 5-10 mg by mouth at bedtime as needed for sleep. 03/12/21  Yes [provider]    Allergies    Penicillins and Sulfa antibiotics  Review of Systems   Review of Systems  Constitutional:  Negative for chills and fever.  HENT:  Negative for ear pain and sore throat.   Eyes:  Negative for pain and visual disturbance.  Respiratory:  Negative for cough and shortness of breath.   Cardiovascular:  Negative for chest pain and palpitations.  Gastrointestinal:  Negative for abdominal pain and vomiting.  Genitourinary:  Negative for dysuria and hematuria.  Musculoskeletal:  Negative for arthralgias, back pain, gait problem, joint swelling, myalgias, neck pain and neck stiffness.  Skin:  Negative for color change and rash.  Neurological:  Positive  for dizziness and headaches. Negative for seizures and syncope.  All other systems reviewed and are negative.  Physical Exam Updated Vital Signs BP (!) 158/102   Pulse 77   Temp 98.6 F (37 C) Comment: oral  Resp 17   Ht 5\' 5"  (1.651 m)   Wt 81.6 kg   SpO2 94%   BMI 29.95 kg/m   Physical Exam Vitals and nursing note reviewed.  Constitutional:      General: She is not in acute distress.    Appearance: She is well-developed. She is not ill-appearing.  HENT:     Head:     Comments: Hematoma to occiput    Nose: Nose normal.     Mouth/Throat:     Mouth: Mucous membranes are moist.  Eyes:     Extraocular  Movements: Extraocular movements intact.     Conjunctiva/sclera: Conjunctivae normal.     Pupils: Pupils are equal, round, and reactive to light.  Cardiovascular:     Rate and Rhythm: Normal rate and regular rhythm.     Pulses: Normal pulses.     Heart sounds: Normal heart sounds. No murmur heard. Pulmonary:     Effort: Pulmonary effort is normal. No respiratory distress.     Breath sounds: Normal breath sounds.  Abdominal:     Palpations: Abdomen is soft.     Tenderness: There is no abdominal tenderness.  Musculoskeletal:        General: No tenderness. Normal range of motion.     Cervical back: Normal range of motion and neck supple. No tenderness.     Comments: No obvious midline spinal tenderness  Skin:    General: Skin is warm and dry.     Capillary Refill: Capillary refill takes less than 2 seconds.  Neurological:     General: No focal deficit present.     Mental Status: She is alert and oriented to person, place, and time.     Cranial Nerves: No cranial nerve deficit.     Sensory: No sensory deficit.     Motor: No weakness.     Coordination: Coordination normal.     Comments: 5+ out of 5 strength throughout, normal sensation, no drift, normal finger-to-nose finger, normal speech    ED Results / Procedures / Treatments   Labs (all labs ordered are listed, but only abnormal results are displayed) Labs Reviewed  BASIC METABOLIC PANEL - Abnormal; Notable for the following components:      Result Value   Glucose, Bld 103 (*)    Creatinine, Ser 1.21 (*)    GFR, Estimated 44 (*)    All other components within normal limits  URINALYSIS, ROUTINE W REFLEX MICROSCOPIC - Abnormal; Notable for the following components:   APPearance HAZY (*)    Leukocytes,Ua MODERATE (*)    Bacteria, UA RARE (*)    Non Squamous Epithelial 0-5 (*)    All other components within normal limits  CBC WITH DIFFERENTIAL/PLATELET    EKG EKG Interpretation  Date/Time:  Wednesday March 23 2021  09:53:02 EDT Ventricular Rate:  82 PR Interval:    QRS Duration: 97 QT Interval:  380 QTC Calculation: 444 R Axis:   -52 Text Interpretation: Atrial fibrillation Left anterior fascicular block Confirmed by Lennice Sites ([id number]) on 03/23/2021 10:37:19 AM  Radiology DG Thoracic Spine 2 View  Result Date: 03/23/2021 CLINICAL DATA:  Fall EXAM: THORACIC SPINE 2 VIEWS COMPARISON:  None. FINDINGS: Thoracic vertebral body heights and alignment are maintained. Mild disc space narrowing  and small endplate osteophytes. IMPRESSION: No acute osseous abnormality. Electronically Signed   By: Macy Mis M.D.   On: 03/23/2021 11:28   CT Head Wo Contrast  Result Date: 03/23/2021 CLINICAL DATA:  87 year old female status post fall. EXAM: CT HEAD WITHOUT CONTRAST CT CERVICAL SPINE WITHOUT CONTRAST TECHNIQUE: Multidetector CT imaging of the head and cervical spine was performed following the standard protocol without intravenous contrast. Multiplanar CT image reconstructions of the cervical spine were also generated. COMPARISON:  Head CT 08/03/2020.  Cervical spine MRI 12/03/2014. FINDINGS: CT HEAD FINDINGS Brain: Smooth round small calcified meningioma versus dural calcification along the left frontal convexity on series 4, image 23 is stable since last year, measuring about 13 mm, but increased since 2016. No significant mass effect. No associated cerebral edema. No other intracranial mass lesion. No midline shift, ventriculomegaly, intracranial hemorrhage or evidence of cortically based acute infarction. Patchy and confluent bilateral cerebral white matter hypodensity and heterogeneity in the deep gray nuclei appears stable. Vascular: Mild Calcified atherosclerosis at the skull base. No suspicious intracranial vascular hyperdensity. Skull: Round dural calcification versus calcified meningioma along the left frontal convexity on series 5, image 55 is increased since 2016 as above and new since 2012. The skull  otherwise appears stable and intact. Sinuses/Orbits: Chronic left maxillary sinusitis has not significantly changed since last year. Other paranasal sinuses and mastoids are clear. Tympanic cavities are clear. Other: No orbit or scalp soft tissue injury identified. CT CERVICAL SPINE FINDINGS Alignment: Stable cervical lordosis. Cervicothoracic junction alignment is within normal limits. Bilateral posterior element alignment is within normal limits. Skull base and vertebrae: Visualized skull base is intact. No atlanto-occipital dissociation. C1 and C2 appear intact and aligned. No acute osseous abnormality identified. Soft tissues and spinal canal: No prevertebral fluid or swelling. No visible canal hematoma. Negative visible noncontrast neck soft tissues. Disc levels: Widespread severe cervical facet arthropathy, generally greater on the left. Lower cervical disc bulging and endplate spurring, however, capacious spinal canal as seen on the 2016 MRI. No significant spinal stenosis suspected. Upper chest: Upper thoracic interbody ankylosis related to flowing endplate osteophytes at T2-T3. Visible upper thoracic levels appear intact. Calcified aortic atherosclerosis. Negative lung apices. IMPRESSION: 1. No acute traumatic injury identified in the head or cervical spine. 2. A small left frontal convexity calcified meningioma versus dural calcification is 13 mm, stable since last year, and appears inconsequential with no significant mass effect. 3. Evidence of cerebral chronic small vessel disease. 4. Advanced cervical facet degeneration. 5. Chronic left maxillary sinusitis. 6. Aortic Atherosclerosis (ICD10-I70.0). Electronically Signed   By: Genevie Ann M.D.   On: 03/23/2021 10:37   CT Cervical Spine Wo Contrast  Result Date: 03/23/2021 CLINICAL DATA:  87 year old female status post fall. EXAM: CT HEAD WITHOUT CONTRAST CT CERVICAL SPINE WITHOUT CONTRAST TECHNIQUE: Multidetector CT imaging of the head and cervical spine  was performed following the standard protocol without intravenous contrast. Multiplanar CT image reconstructions of the cervical spine were also generated. COMPARISON:  Head CT 08/03/2020.  Cervical spine MRI 12/03/2014. FINDINGS: CT HEAD FINDINGS Brain: Smooth round small calcified meningioma versus dural calcification along the left frontal convexity on series 4, image 23 is stable since last year, measuring about 13 mm, but increased since 2016. No significant mass effect. No associated cerebral edema. No other intracranial mass lesion. No midline shift, ventriculomegaly, intracranial hemorrhage or evidence of cortically based acute infarction. Patchy and confluent bilateral cerebral white matter hypodensity and heterogeneity in the deep gray nuclei appears stable.  Vascular: Mild Calcified atherosclerosis at the skull base. No suspicious intracranial vascular hyperdensity. Skull: Round dural calcification versus calcified meningioma along the left frontal convexity on series 5, image 55 is increased since 2016 as above and new since 2012. The skull otherwise appears stable and intact. Sinuses/Orbits: Chronic left maxillary sinusitis has not significantly changed since last year. Other paranasal sinuses and mastoids are clear. Tympanic cavities are clear. Other: No orbit or scalp soft tissue injury identified. CT CERVICAL SPINE FINDINGS Alignment: Stable cervical lordosis. Cervicothoracic junction alignment is within normal limits. Bilateral posterior element alignment is within normal limits. Skull base and vertebrae: Visualized skull base is intact. No atlanto-occipital dissociation. C1 and C2 appear intact and aligned. No acute osseous abnormality identified. Soft tissues and spinal canal: No prevertebral fluid or swelling. No visible canal hematoma. Negative visible noncontrast neck soft tissues. Disc levels: Widespread severe cervical facet arthropathy, generally greater on the left. Lower cervical disc  bulging and endplate spurring, however, capacious spinal canal as seen on the 2016 MRI. No significant spinal stenosis suspected. Upper chest: Upper thoracic interbody ankylosis related to flowing endplate osteophytes at T2-T3. Visible upper thoracic levels appear intact. Calcified aortic atherosclerosis. Negative lung apices. IMPRESSION: 1. No acute traumatic injury identified in the head or cervical spine. 2. A small left frontal convexity calcified meningioma versus dural calcification is 13 mm, stable since last year, and appears inconsequential with no significant mass effect. 3. Evidence of cerebral chronic small vessel disease. 4. Advanced cervical facet degeneration. 5. Chronic left maxillary sinusitis. 6. Aortic Atherosclerosis (ICD10-I70.0). Electronically Signed   By: Genevie Ann M.D.   On: 03/23/2021 10:37   DG Chest Portable 1 View  Result Date: 03/23/2021 CLINICAL DATA:  Fall with right posterior chest and upper back pain. EXAM: PORTABLE CHEST 1 VIEW COMPARISON:  03/23/2021 FINDINGS: Mild cardiac enlargement. No pleural effusion or edema. No airspace densities identified. Visualized osseous structures appear intact. IMPRESSION: No acute cardiopulmonary abnormalities. Electronically Signed   By: Kerby Moors M.D.   On: 03/23/2021 09:44   DG Hand Complete Right  Result Date: 03/23/2021 CLINICAL DATA:  Fall, third and fourth proximal finger pain. EXAM: RIGHT HAND - COMPLETE 3+ VIEW COMPARISON:  None. FINDINGS: No acute osseous or joint abnormality. Advanced degenerative changes at the scaphoid trapezium trapezoid joint. Mild degenerative changes in the distal interphalangeal joints. Chondrocalcinosis in the radiocarpal joint. IMPRESSION: 1. No acute osseous or joint abnormality. 2. 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 Electronically Signed   By: Lorin Picket M.D.   On: 03/23/2021 11:08    Procedures Procedures   Medications Ordered in ED Medications   sodium chloride 0.9 % bolus 500 mL (0 mLs Intravenous Stopped 03/23/21 1124)    ED Course  I have reviewed the triage vital signs and the nursing notes.  Pertinent labs & imaging results that were available during my care of the patient were reviewed by me and considered in my medical decision making (see chart for details).    MDM Rules/Calculators/A&P                          Tiffany Black is here as a level 2 trauma after a fall at home.  History of hypertension, A. fib on Xarelto, history of vertigo, history of stroke.  Had an episode of dizziness/mechanical fall just prior to arrival.  Hit the back of her head but did not lose consciousness.  She states that she  has issues with dizziness and balance at times.  She is neurologically intact on exam.  Does not have any dizziness or lightheadedness now.  There is a questionable deformity to the right hand per EMS but overall did not see any type of hand injury.  Will get x-ray.  Will check basic labs and give her fluid bolus.  Does not have any pain in her lower legs.  Will get head CT, neck CT, chest x-ray, back x-ray.  Imaging unremarkable for any acute injuries.  Urinalysis negative for infection.  Seems more consistent with contamination.  She is not having any urinary symptoms.  Overall she appears well.  No concern for stroke.  She is asymptomatic.  Recommend follow-up with her primary care doctor.  This chart was dictated using voice recognition software.  Despite best efforts to proofread,  errors can occur which can change the documentation meaning.   Final Clinical Impression(s) / ED Diagnoses Final diagnoses:  Fall, initial encounter    Rx / DC Orders ED Discharge Orders     None        Lennice Sites, DO 03/23/21 1406

## 2021-03-23 NOTE — ED Notes (Signed)
Provider at bedside

## 2021-03-23 NOTE — Discharge Instructions (Addendum)
Follow-up with your primary care doctor to discuss your medications.

## 2021-03-23 NOTE — ED Notes (Signed)
Patient transported to CT with trauma RN °

## 2021-03-31 ENCOUNTER — Ambulatory Visit: Payer: Medicare Other | Admitting: Internal Medicine

## 2021-03-31 DIAGNOSIS — I4819 Other persistent atrial fibrillation: Secondary | ICD-10-CM

## 2021-03-31 DIAGNOSIS — I951 Orthostatic hypotension: Secondary | ICD-10-CM

## 2021-03-31 DIAGNOSIS — I491 Atrial premature depolarization: Secondary | ICD-10-CM

## 2021-04-04 ENCOUNTER — Other Ambulatory Visit: Payer: Self-pay | Admitting: Internal Medicine

## 2021-06-03 ENCOUNTER — Other Ambulatory Visit: Payer: Self-pay | Admitting: Internal Medicine

## 2021-06-03 ENCOUNTER — Other Ambulatory Visit: Payer: Self-pay

## 2021-06-05 ENCOUNTER — Encounter (HOSPITAL_COMMUNITY): Payer: Self-pay | Admitting: Emergency Medicine

## 2021-06-05 ENCOUNTER — Other Ambulatory Visit: Payer: Self-pay

## 2021-06-05 ENCOUNTER — Emergency Department (HOSPITAL_COMMUNITY)
Admission: EM | Admit: 2021-06-05 | Discharge: 2021-06-05 | Disposition: A | Discharge status: Home / Self Care | Payer: Medicare Other | Attending: Emergency Medicine | Admitting: Emergency Medicine

## 2021-06-05 DIAGNOSIS — R5383 Other fatigue: Secondary | ICD-10-CM | POA: Diagnosis not present

## 2021-06-05 DIAGNOSIS — Z20822 Contact with and (suspected) exposure to covid-19: Secondary | ICD-10-CM | POA: Diagnosis not present

## 2021-06-05 DIAGNOSIS — Z8541 Personal history of malignant neoplasm of cervix uteri: Secondary | ICD-10-CM | POA: Insufficient documentation

## 2021-06-05 DIAGNOSIS — I4819 Other persistent atrial fibrillation: Secondary | ICD-10-CM | POA: Insufficient documentation

## 2021-06-05 DIAGNOSIS — Z79899 Other long term (current) drug therapy: Secondary | ICD-10-CM | POA: Insufficient documentation

## 2021-06-05 DIAGNOSIS — Z7901 Long term (current) use of anticoagulants: Secondary | ICD-10-CM | POA: Insufficient documentation

## 2021-06-05 DIAGNOSIS — I10 Essential (primary) hypertension: Secondary | ICD-10-CM | POA: Diagnosis not present

## 2021-06-05 DIAGNOSIS — Z91040 Latex allergy status: Secondary | ICD-10-CM | POA: Diagnosis not present

## 2021-06-05 LAB — URINALYSIS, ROUTINE W REFLEX MICROSCOPIC
Bilirubin Urine: NEGATIVE
Glucose, UA: NEGATIVE mg/dL
Hgb urine dipstick: NEGATIVE
Ketones, ur: NEGATIVE mg/dL
Nitrite: NEGATIVE
Protein, ur: NEGATIVE mg/dL
Specific Gravity, Urine: 1.005 (ref 1.005–1.030)
pH: 6 (ref 5.0–8.0)

## 2021-06-05 LAB — CBC WITH DIFFERENTIAL/PLATELET
Abs Immature Granulocytes: 0.02 10*3/uL (ref 0.00–0.07)
Basophils Absolute: 0.1 10*3/uL (ref 0.0–0.1)
Basophils Relative: 1 %
Eosinophils Absolute: 0.3 10*3/uL (ref 0.0–0.5)
Eosinophils Relative: 4 %
HCT: 37.4 % (ref 36.0–46.0)
Hemoglobin: 12 g/dL (ref 12.0–15.0)
Immature Granulocytes: 0 %
Lymphocytes Relative: 17 %
Lymphs Abs: 1.1 10*3/uL (ref 0.7–4.0)
MCH: 30.5 pg (ref 26.0–34.0)
MCHC: 32.1 g/dL (ref 30.0–36.0)
MCV: 94.9 fL (ref 80.0–100.0)
Monocytes Absolute: 0.5 10*3/uL (ref 0.1–1.0)
Monocytes Relative: 8 %
Neutro Abs: 4.6 10*3/uL (ref 1.7–7.7)
Neutrophils Relative %: 70 %
Platelets: 174 10*3/uL (ref 150–400)
RBC: 3.94 MIL/uL (ref 3.87–5.11)
RDW: 13.2 % (ref 11.5–15.5)
WBC: 6.5 10*3/uL (ref 4.0–10.5)
nRBC: 0 % (ref 0.0–0.2)

## 2021-06-05 LAB — BASIC METABOLIC PANEL
Anion gap: 9 (ref 5–15)
BUN: 27 mg/dL — ABNORMAL HIGH (ref 8–23)
CO2: 23 mmol/L (ref 22–32)
Calcium: 9.2 mg/dL (ref 8.9–10.3)
Chloride: 106 mmol/L (ref 98–111)
Creatinine, Ser: 1.44 mg/dL — ABNORMAL HIGH (ref 0.44–1.00)
GFR, Estimated: 36 mL/min — ABNORMAL LOW (ref >60)
Glucose, Bld: 100 mg/dL — ABNORMAL HIGH (ref 70–99)
Potassium: 3.7 mmol/L (ref 3.5–5.1)
Sodium: 138 mmol/L (ref 135–145)

## 2021-06-05 LAB — MAGNESIUM: Magnesium: 2.2 mg/dL (ref 1.7–2.4)

## 2021-06-05 LAB — RESP PANEL BY RT-PCR (FLU A&B, COVID) ARPGX2
Influenza A by PCR: NEGATIVE
Influenza B by PCR: NEGATIVE
SARS Coronavirus 2 by RT PCR: NEGATIVE

## 2021-06-05 LAB — TSH: TSH: 2.456 u[IU]/mL (ref 0.350–4.500)

## 2021-06-05 MED ORDER — SODIUM CHLORIDE 0.9 % IV BOLUS
1000.0000 mL | Freq: Once | INTRAVENOUS | Status: AC
  Administered 2021-06-05: 1000 mL via INTRAVENOUS

## 2021-06-05 NOTE — Discharge Instructions (Signed)
You came to the emergency department today to be evaluated for your fatigue over the last 2 weeks.  Your physical exam, lab work, and EKG were reassuring.  Your lab work showed that your dehydrated and therefore received fluids in the emergency department.  Please follow-up with your primary care provider for further evaluation of your fatigue.  Get help right away if: You feel confused. Your vision is blurry. You feel faint or you pass out. You have a severe headache. You have severe pain in your abdomen, your back, or the area between your waist and hips (pelvis). You have chest pain, shortness of breath, or an irregular or fast heartbeat. You are unable to urinate, or you urinate less than normal. You have abnormal bleeding, such as bleeding from the rectum, vagina, nose, lungs, or nipples. You vomit blood. You have thoughts about hurting yourself or others.

## 2021-06-05 NOTE — ED Notes (Signed)
Provided w/ PO food and fluids. Ambulated to RR and back w/ stand-by assistance.

## 2021-06-05 NOTE — ED Provider Notes (Signed)
[facility] DEPT Provider Note   CSN: [id number] Arrival date & time: 06/05/21  1112     History No chief complaint on file.   Tiffany Black is a 87 y.o. female with history of persistent atrial fibrillation (on Xarelto and metoprolol), history of CVA, hypertension, vertigo, anxiety.  Presents to emergency department with chief complaint of fatigue x2 weeks.  Patient reports that she has nothing getting out of bed is much and has had a decreased appetite over this time.  Patient denies any known sick contacts.  Patient denies any falls or traumatic injuries.  Patient denies any fevers, chills, nasal pain, rhinorrhea, sore throat, chest pain, palpitations, shortness of breath, abdominal pain, nausea, vomiting, diarrhea, dysuria, hematuria, urinary frequency, vaginal pain, vaginal bleeding, vaginal discharge, headache, lightheadedness, syncope.  HPI     Past Medical History:  Diagnosis Date   Anxiety    Arthritis    Bulging of cervical intervertebral disc    Cardiomegaly    Cataracts, bilateral    Chronic neck pain    History of endometrial cancer    History of stroke    2015 and 2016 // carotid US 8/16: Bilateral 1-39 // carotid US 2/16: Bilateral ICA 1-39   HLD (hyperlipidemia)    Hx of echocardiogram    a. Echo (11/15): Mild LVH, EF 55-60%, normal wall motion, grade 2 diastolic dysfunction, mild LAE, normal RV function, PASP 29 mm Hg // echo 8/16:Mild concentric LVH, EF 55-60, normal wall motion, grade 2 diastolic dysfunction, mild LAE, PASP 39   Hypertension    Insomnia    Lung nodule    OA (osteoarthritis)    Pectus excavatum    Persistent atrial fibrillation ([place])    Vertigo     Patient Active Problem List   Diagnosis Date Noted   Headache syndrome 07/02/2017   Chest pain 05/23/2015   Essential hypertension    History of stroke 05/10/2015   Dizziness 05/10/2015   Orthostatic hypotension 05/10/2015   CVA (cerebral infarction)  05/10/2015   Hypokalemia 09/30/2013   Vertigo 02/28/2013   Sinusitis 02/28/2013   Anxiety 02/28/2013   PAC (premature atrial contraction) 04/10/2012   Abnormal EKG 01/19/2012   Persistent atrial fibrillation ([place]) 02/15/2010    Past Surgical History:  Procedure Laterality Date   ABDOMINAL HYSTERECTOMY  2011   CATARACT EXTRACTION, BILATERAL Bilateral 2009   CESAREAN SECTION  1975   LAPAROSCOPIC HYSTERECTOMY  2011     OB History   No obstetric history on file.     Family History  Problem Relation Age of Onset   Dementia Mother    Heart attack Father    Heart disease Father    Stroke Maternal Uncle    Hypertension Neg Hx     Social History   Tobacco Use   Smoking status: Never   Smokeless tobacco: Never  Vaping Use   Vaping Use: Never used  Substance Use Topics   Alcohol use: No   Drug use: No    Home Medications Prior to Admission medications   Medication Sig Start Date End Date Taking? Authorizing Provider  acetaminophen (TYLENOL) 325 MG tablet Take 650 mg by mouth every 6 (six) hours as needed for mild pain.    [provider]  acetaminophen (TYLENOL) 500 MG tablet Take 500 mg by mouth every 6 (six) hours as needed for mild pain or headache.    [provider]  calcium-vitamin D (OSCAL WITH D) 500-200 MG-UNIT tablet Take 1 tablet  by mouth.    [provider]  Cholecalciferol (VITAMIN D-3 PO) Take 1 capsule by mouth daily.    [provider]  DIOVAN 160 MG tablet Take 80 mg by mouth 2 (two) times daily. 02/16/21   [provider]  felodipine (PLENDIL) 5 MG 24 hr tablet TAKE 1 TABLET BY MOUTH EVERY DAY *KEEP APPT IN JULY* 04/05/21   Deboraha Sprang, MD  FLUoxetine (PROZAC) 10 MG capsule Take 10 mg by mouth daily. 01/14/21   [provider]  FLUoxetine (PROZAC) 10 MG tablet Take 1 tablet (10 mg total) by mouth every other day. Patient taking differently: Take 10 mg by mouth daily.  01/27/19   Deboraha Sprang, MD   KLOR-CON M20 20 MEQ tablet TAKE 1/2 TABLET BY MOUTH ONCE DAILY Patient not taking: Reported on 08/03/2020 10/03/17   Lauree Chandler, NP  labetalol (NORMODYNE) 200 MG tablet TAKE 1 TABLET EVERY MORNING AND TAKE 1 AND 1/2 TABLETS AT NIGHT *[address]-01*. Please make yearly appt with Dr. Caryl Comes for March 2022 for future refills. Thank you 1st attempt 08/20/20   Deboraha Sprang, MD  labetalol (NORMODYNE) 200 MG tablet Take 200-300 mg by mouth in the morning and at bedtime. 200mg  in the morning and 300mg  at bedtime 02/05/21   [provider]  meclizine (ANTIVERT) 12.5 MG tablet Take 1 tablet (12.5 mg total) by mouth 3 (three) times daily as needed for dizziness. Patient not taking: Reported on 08/03/2020 12/26/19   Quintella Reichert, MD  meclizine (ANTIVERT) 25 MG tablet Take 25 mg by mouth 3 (three) times daily as needed for dizziness.    [provider]  metoprolol tartrate (LOPRESSOR) 50 MG tablet Take 1 tablet (50 mg total) by mouth 2 (two) times daily. Patient taking differently: Take 25 mg by mouth 2 (two) times daily.  04/12/20   Deboraha Sprang, MD  metoprolol tartrate (LOPRESSOR) 50 MG tablet Take 25 mg by mouth 2 (two) times daily. 12/22/20   [provider]  oxyCODONE-acetaminophen (PERCOCET) 5-325 MG tablet Take 1-2 tablets by mouth every 6 (six) hours as needed. Patient not taking: Reported on 12/26/2019 04/29/19   Veryl Speak, MD  potassium chloride SA (KLOR-CON) 20 MEQ tablet Take 10 mEq by mouth daily.    [provider]  spironolactone (ALDACTONE) 25 MG tablet Take 25 mg by mouth daily.    [provider]  traMADol (ULTRAM) 50 MG tablet Take 1 tablet (50 mg total) by mouth every 6 (six) hours as needed. 02/17/21   Veryl Speak, MD  valsartan (DIOVAN) 160 MG tablet Take 80 mg by mouth 2 (two) times daily.    [provider]  valsartan (DIOVAN) 80 MG tablet Take 1 tablet (80 mg total) by mouth 2 (two) times daily. take one 80 mg tablet  twice daily (no longer taking 160 mg 1/2 tablet twice daily) Thank you. Patient not taking: Reported on 12/26/2019 11/24/19   Deboraha Sprang, MD  XARELTO 20 MG TABS tablet TAKE 1 TABLET BY MOUTH EVERY DAY WITH SUPPER 08/09/20   Deboraha Sprang, MD  XARELTO 20 MG TABS tablet Take 20 mg by mouth daily. 03/08/21   [provider]  zolpidem (AMBIEN) 10 MG tablet Take 1/2 tablet by mouth at bedtime for rest Patient taking differently: Take 5-10 mg by mouth at bedtime as needed for sleep.  12/19/17   Lauree Chandler, NP  zolpidem (AMBIEN) 10 MG tablet Take 5-10 mg by mouth at bedtime as  needed for sleep. 03/12/21   [provider]    Allergies    Hydrochlorothiazide, Penicillins, Ciprofloxacin, Doxycycline, Hydrocodone-acetaminophen, Iodine-131, Latex, Penicillins, Statins, Sulfa antibiotics, Carbamazepine, Codeine, Sulfamethoxazole, and Sulfonamide derivatives  Review of Systems   Review of Systems  Constitutional:  Negative for chills and fever.  HENT:  Negative for congestion, rhinorrhea and sore throat.   Eyes:  Negative for visual disturbance.  Respiratory:  Negative for cough and shortness of breath.   Cardiovascular:  Negative for chest pain, palpitations and leg swelling.  Gastrointestinal:  Negative for abdominal pain, diarrhea, nausea and vomiting.  Genitourinary:  Negative for difficulty urinating, dysuria, flank pain, frequency, hematuria, urgency, vaginal bleeding, vaginal discharge and vaginal pain.  Musculoskeletal:  Negative for back pain and neck pain.  Skin:  Negative for color change and rash.  Neurological:  Negative for dizziness, syncope, light-headedness and headaches.  Psychiatric/Behavioral:  Negative for confusion.    Physical Exam Updated Vital Signs BP (!) 157/104 (BP Location: Right Arm)   Pulse 94   Temp 98 F (36.7 C) (Oral)   Resp 17   Ht 5\' 5"  (1.651 m)   Wt 82 kg   SpO2 96%   BMI 30.08 kg/m   Physical Exam Vitals and nursing note  reviewed.  Constitutional:      General: She is not in acute distress.    Appearance: She is not ill-appearing, toxic-appearing or diaphoretic.  HENT:     Head: Normocephalic.  Eyes:     General: No scleral icterus.       Right eye: No discharge.        Left eye: No discharge.  Cardiovascular:     Rate and Rhythm: Normal rate. Rhythm irregularly irregular.     Pulses:          Radial pulses are 2+ on the right side and 2+ on the left side.     Heart sounds: Normal heart sounds.  Pulmonary:     Effort: Pulmonary effort is normal. No tachypnea, bradypnea or prolonged expiration.     Breath sounds: Normal breath sounds. No stridor.  Abdominal:     General: Bowel sounds are normal. There is no distension. There are no signs of injury.     Palpations: Abdomen is soft. There is no mass or pulsatile mass.     Tenderness: There is no abdominal tenderness. There is no right CVA tenderness, left CVA tenderness, guarding or rebound.     Hernia: There is no hernia in the umbilical area or ventral area.  Musculoskeletal:     Cervical back: Neck supple.     Comments: No midline tenderness or deformity to cervical, thoracic, or lumbar spine.  Skin:    General: Skin is warm and dry.     Findings: No rash.     Comments: healing ecchymosis to left upper thoracic back.  Neurological:     General: No focal deficit present.     Mental Status: She is alert and oriented to person, place, and time.     GCS: GCS eye subscore is 4. GCS verbal subscore is 5. GCS motor subscore is 6.     Comments: Patient is alert to person, place, and time.  Psychiatric:        Behavior: Behavior is cooperative.    ED Results / Procedures / Treatments   Labs (all labs ordered are listed, but only abnormal results are displayed) Labs Reviewed  URINALYSIS, ROUTINE W REFLEX MICROSCOPIC - Abnormal; Notable for the  following components:      Result Value   Leukocytes,Ua TRACE (*)    Bacteria, UA RARE (*)    All other  components within normal limits  BASIC METABOLIC PANEL - Abnormal; Notable for the following components:   Glucose, Bld 100 (*)    BUN 27 (*)    Creatinine, Ser 1.44 (*)    GFR, Estimated 36 (*)    All other components within normal limits  RESP PANEL BY RT-PCR (FLU A&B, COVID) [id number]  CBC WITH DIFFERENTIAL/PLATELET  TSH  MAGNESIUM    EKG EKG Interpretation  Date/Time:  Sunday June 05 2021 13:17:55 EDT Ventricular Rate:  83 PR Interval:    QRS Duration: 98 QT Interval:  456 QTC Calculation: 536 R Axis:   -54 Text Interpretation: Atrial fibrillation Left anterior fascicular block LVH with secondary repolarization abnormality no sig change from previous Confirmed by Charlesetta Shanks [phone]) on 06/05/2021 1:52:18 PM  Radiology No results found.  Procedures Procedures   Medications Ordered in ED Medications - No data to display  ED Course  I have reviewed the triage vital signs and the nursing notes.  Pertinent labs & imaging results that were available during my care of the patient were reviewed by me and considered in my medical decision making (see chart for details).  Clinical Course as of 06/05/21 1455  Sun Jun 05, 2021  1246 Spoke to patient's son who corroborates patient's story.  Additionally he states that on Thursday she had an episode of dizziness while cleaning the house, no associated fall or syncope.  Also states that patient ran out of her any medication and did not take it last night and therefore did not have as restful a night's sleep. [PB]    Clinical Course User Index [PB] Dyann Ruddle   MDM Rules/Calculators/A&P                           Alert 87 year old female in no acute distress, nontoxic appearing.  Patient is alert to person, place, and time.  Presents with chief complaint of 2 weeks of fatigue and decreased appetite.  Patient denies any other symptoms.  No known sick contacts.  Will obtain TSH, Mg, BMP, CBC, UA, respiratory  panel and EKG to evaluate for the cause of patient's fatigue.  UA shows bacteria rare, leukocyte trace, nitrite negative; patient has no urinary symptoms with suspicion for UTI at this time. TSH, magnesium, and CBC are unremarkable Respiratory panel negative for COVID-19 and influenza BMP shows creatinine and BUN increased at 1.44 and 27 respectively.  We will give patient fluid bolus.  Attempted to contact patient's son Gerald Stabs to inform him of results, call went straight to voicemail, message was left with him.  Patient will need to follow-up with primary care provider in outpatient setting.  Discussed results, findings, treatment and follow up. Patient advised of return precautions. Patient verbalized understanding and agreed with plan.   Final Clinical Impression(s) / ED Diagnoses Final diagnoses:  Other fatigue    Rx / DC Orders ED Discharge Orders     None        Loni Beckwith, PA-C 06/05/21 1458    Charlesetta Shanks, MD 06/12/21 1615

## 2021-06-05 NOTE — ED Triage Notes (Signed)
BIB EMS home, patient called with general weakness x2 weeks and increased confusion x2 weeks. Son suspects UTI. Decreased appetite. Denies urinary issues.   BP 140/98 HR 90 RR 18 CBG 122 O2 98% RA

## 2021-06-06 ENCOUNTER — Other Ambulatory Visit: Payer: Self-pay

## 2021-06-06 NOTE — Telephone Encounter (Signed)
Pt last saw Dr Caryl Comes 11/20/19, pt is overdue for follow-up.  Last labs 06/05/21 Creat 1.44, Age 87, weight 82kg, CrCl 36.97, based on CrCl  pt is not on appropriate dosage.  Sent message to schedulers will await appt to refill rx.  Creatinine has been steadily increasing:  Creat 06/05/21 -1.44, Creat 03/23/21 -1.21, Creat 02/16/21- 1.21, Creat 08/03/20 1.06, 12/26/19 Creat 1.20 Will forward message to Dr Caryl Comes to see if Xarelto dosage adjustment is appropriate given CrCl 36.97, please advise if appropriate to adjust dosage of Xarelto to 15mg  QD given CrCl is between 15-50.

## 2021-06-06 NOTE — Telephone Encounter (Signed)
-----   Message from Deboraha Sprang, MD sent at 06/06/2021  1:14 PM EDT ----- Regarding: RE: Sounds right.  Tiffany Black.  Thx.  20>>> 15 mg ----- Message ----- From: Brynda Peon, RN Sent: 06/06/2021   1:13 PM EDT To: Deboraha Sprang, MD  Pt last saw Dr Caryl Comes 11/20/19, pt is overdue for follow-up.  Last labs 06/05/21 Creat 1.44, Age 87, weight 82kg, CrCl 36.97, based on CrCl  pt is not on appropriate dosage.  Sent message to schedulers will await appt to refill rx.   Creatinine has been steadily increasing:  Creat 06/05/21 -1.44, Creat 03/23/21 -1.21, Creat 02/16/21- 1.21, Creat 08/03/20 1.06, 12/26/19 Creat 1.20  Will forward message to Dr Caryl Comes to see if Xarelto dosage adjustment is appropriate given CrCl 36.97, please advise if appropriate to adjust dosage of Xarelto to 15mg  QD given CrCl is between 15-50.  Please advise.  Thanks

## 2021-06-08 NOTE — Telephone Encounter (Signed)
Called pt since she needs an appt with Dr. Caryl Comes, she was last seen 11/20/2019 via Telemedicine. Spoke with pt and she stated she has lost her zolpidem and is currently looking for the med in her car. She stated she would have to call back cause she has got to call Dr. Brigitte Pulse to get it filled right away. Pt stated she would call back and she knew the number for the office.   Pt will need a Xarelto dose change per prior communication between [place], [place] & Dr. Caryl Comes and an appt with Dr. Caryl Comes. Will await for the pt to set an appt with Dr. Caryl Comes.

## 2021-06-11 ENCOUNTER — Other Ambulatory Visit: Payer: Self-pay

## 2021-06-11 ENCOUNTER — Emergency Department (HOSPITAL_COMMUNITY)
Admission: EM | Admit: 2021-06-11 | Discharge: 2021-06-11 | Disposition: A | Discharge status: Home / Self Care | Payer: Medicare Other | Attending: Emergency Medicine | Admitting: Emergency Medicine

## 2021-06-11 DIAGNOSIS — R410 Disorientation, unspecified: Secondary | ICD-10-CM | POA: Diagnosis present

## 2021-06-11 DIAGNOSIS — Z91040 Latex allergy status: Secondary | ICD-10-CM | POA: Diagnosis not present

## 2021-06-11 DIAGNOSIS — I10 Essential (primary) hypertension: Secondary | ICD-10-CM | POA: Diagnosis not present

## 2021-06-11 DIAGNOSIS — Z79899 Other long term (current) drug therapy: Secondary | ICD-10-CM | POA: Insufficient documentation

## 2021-06-11 DIAGNOSIS — Z8542 Personal history of malignant neoplasm of other parts of uterus: Secondary | ICD-10-CM | POA: Diagnosis not present

## 2021-06-11 LAB — BASIC METABOLIC PANEL
Anion gap: 11 (ref 5–15)
BUN: 13 mg/dL (ref 8–23)
CO2: 26 mmol/L (ref 22–32)
Calcium: 9.7 mg/dL (ref 8.9–10.3)
Chloride: 105 mmol/L (ref 98–111)
Creatinine, Ser: 1.19 mg/dL — ABNORMAL HIGH (ref 0.44–1.00)
GFR, Estimated: 45 mL/min — ABNORMAL LOW (ref >60)
Glucose, Bld: 98 mg/dL (ref 70–99)
Potassium: 3.3 mmol/L — ABNORMAL LOW (ref 3.5–5.1)
Sodium: 142 mmol/L (ref 135–145)

## 2021-06-11 LAB — CBC
HCT: 40.8 % (ref 36.0–46.0)
Hemoglobin: 13.3 g/dL (ref 12.0–15.0)
MCH: 30.3 pg (ref 26.0–34.0)
MCHC: 32.6 g/dL (ref 30.0–36.0)
MCV: 92.9 fL (ref 80.0–100.0)
Platelets: 224 10*3/uL (ref 150–400)
RBC: 4.39 MIL/uL (ref 3.87–5.11)
RDW: 13.2 % (ref 11.5–15.5)
WBC: 7 10*3/uL (ref 4.0–10.5)
nRBC: 0 % (ref 0.0–0.2)

## 2021-06-11 LAB — URINALYSIS, ROUTINE W REFLEX MICROSCOPIC
Bilirubin Urine: NEGATIVE
Glucose, UA: NEGATIVE mg/dL
Hgb urine dipstick: NEGATIVE
Ketones, ur: 5 mg/dL — AB
Nitrite: NEGATIVE
Protein, ur: NEGATIVE mg/dL
Specific Gravity, Urine: 1.01 (ref 1.005–1.030)
pH: 6 (ref 5.0–8.0)

## 2021-06-11 LAB — TROPONIN I (HIGH SENSITIVITY): Troponin I (High Sensitivity): 9 ng/L (ref <18)

## 2021-06-11 MED ORDER — METOPROLOL TARTRATE 25 MG PO TABS
25.0000 mg | ORAL_TABLET | Freq: Once | ORAL | Status: AC
  Administered 2021-06-11: 25 mg via ORAL
  Filled 2021-06-11: qty 1

## 2021-06-11 NOTE — ED Provider Notes (Signed)
[facility] DEPT Provider Note   CSN: [id number] Arrival date & time: 06/11/21  1113     History Chief Complaint  Patient presents with   Altered Mental Status    Confused/Paranoid   Weakness    Tiffany Black is a 87 y.o. female.  HPI Patient called EMS this morning.  They interviewed the patient, she is very cheerful and pleasant.  She is clinically well in appearance.  She admits she really does not member why she called EMS she recalls that her son left fairly early for work at [employer].  She reports she got up to talk to him a little bit but then ended up going back to bed until about 9 AM.  She opines that she must of called EMS because she got dizzy.  She denies she has any symptoms now.  Review of triage note indicates that upon EMS arrival, patient indicated concern that there might be bedbugs in the home.  Nursing was able to get up hold the patient's son and he reported that they had recently had a bedbug outbreak at [employer].  The patient is very well-groomed and clean in appearance.  EMS reports the apartment is very clean as well.  Patient denies she is experiencing any chest pain.  She insists that her blood work will be normal because it has been her whole life and [place] has been [place]    Past Medical History:  Diagnosis Date   Anxiety    Arthritis    Bulging of cervical intervertebral disc    Cardiomegaly    Cataracts, bilateral    Chronic neck pain    History of endometrial cancer    History of stroke    2015 and 2016 // carotid US 8/16: Bilateral 1-39 // carotid US 2/16: Bilateral ICA 1-39   HLD (hyperlipidemia)    Hx of echocardiogram    a. Echo (11/15): Mild LVH, EF 55-60%, normal wall motion, grade 2 diastolic dysfunction, mild LAE, normal RV function, PASP 29 mm Hg // echo 8/16:Mild concentric LVH, EF 55-60, normal wall motion, grade 2 diastolic dysfunction, mild LAE, PASP 39    Hypertension    Insomnia    Lung nodule    OA (osteoarthritis)    Pectus excavatum    Persistent atrial fibrillation ([place])    Vertigo     Patient Active Problem List   Diagnosis Date Noted   Headache syndrome 07/02/2017   Chest pain 05/23/2015   Essential hypertension    History of stroke 05/10/2015   Dizziness 05/10/2015   Orthostatic hypotension 05/10/2015   CVA (cerebral infarction) 05/10/2015   Hypokalemia 09/30/2013   Vertigo 02/28/2013   Sinusitis 02/28/2013   Anxiety 02/28/2013   PAC (premature atrial contraction) 04/10/2012   Abnormal EKG 01/19/2012   Persistent atrial fibrillation ([place]) 02/15/2010    Past Surgical History:  Procedure Laterality Date   ABDOMINAL HYSTERECTOMY  2011   CATARACT EXTRACTION, BILATERAL Bilateral 2009   CESAREAN SECTION  1975   LAPAROSCOPIC HYSTERECTOMY  2011     OB History   No obstetric history on file.     Family History  Problem Relation Age of Onset   Dementia Mother    Heart attack Father    Heart disease Father    Stroke Maternal Uncle    Hypertension Neg Hx     Social History   Tobacco Use   Smoking status: Never   Smokeless tobacco: Never  Vaping Use   Vaping Use: Never used  Substance Use Topics   Alcohol use: No   Drug use: No    Home Medications Prior to Admission medications   Medication Sig Start Date End Date Taking? Authorizing Provider  acetaminophen (TYLENOL) 325 MG tablet Take 650 mg by mouth every 6 (six) hours as needed for mild pain.    [provider]  acetaminophen (TYLENOL) 500 MG tablet Take 500 mg by mouth every 6 (six) hours as needed for mild pain or headache.    [provider]  calcium-vitamin D (OSCAL WITH D) 500-200 MG-UNIT tablet Take 1 tablet by mouth.    [provider]  Cholecalciferol (VITAMIN D-3 PO) Take 1 capsule by mouth daily.    [provider]  DIOVAN 160 MG tablet Take 80 mg by mouth 2 (two) times daily. 02/16/21   [provider]  felodipine (PLENDIL) 5 MG 24 hr tablet TAKE 1 TABLET BY MOUTH EVERY DAY *KEEP APPT IN JULY* 06/06/21   Deboraha Sprang, MD  FLUoxetine (PROZAC) 10 MG capsule Take 10 mg by mouth daily. 01/14/21   [provider]  FLUoxetine (PROZAC) 10 MG tablet Take 1 tablet (10 mg total) by mouth every other day. Patient taking differently: Take 10 mg by mouth daily.  01/27/19   Deboraha Sprang, MD  KLOR-CON M20 20 MEQ tablet TAKE 1/2 TABLET BY MOUTH ONCE DAILY Patient not taking: Reported on 08/03/2020 10/03/17   Lauree Chandler, NP  labetalol (NORMODYNE) 200 MG tablet TAKE 1 TABLET EVERY MORNING AND TAKE 1 AND 1/2 TABLETS AT NIGHT *[address]-01*. Please make yearly appt with Dr. Caryl Comes for March 2022 for future refills. Thank you 1st attempt 08/20/20   Deboraha Sprang, MD  labetalol (NORMODYNE) 200 MG tablet Take 200-300 mg by mouth in the morning and at bedtime. 200mg  in the morning and 300mg  at bedtime 02/05/21   [provider]  meclizine (ANTIVERT) 12.5 MG tablet Take 1 tablet (12.5 mg total) by mouth 3 (three) times daily as needed for dizziness. Patient not taking: Reported on 08/03/2020 12/26/19   Quintella Reichert, MD  meclizine (ANTIVERT) 25 MG tablet Take 25 mg by mouth 3 (three) times daily as needed for dizziness.    [provider]  metoprolol tartrate (LOPRESSOR) 50 MG tablet Take 1 tablet (50 mg total) by mouth 2 (two) times daily. Patient taking differently: Take 25 mg by mouth 2 (two) times daily.  04/12/20   Deboraha Sprang, MD  metoprolol tartrate (LOPRESSOR) 50 MG tablet Take 25 mg by mouth 2 (two) times daily. 12/22/20   [provider]  oxyCODONE-acetaminophen (PERCOCET) 5-325 MG tablet Take 1-2 tablets by mouth every 6 (six) hours as needed. Patient not taking: Reported on 12/26/2019 04/29/19   Veryl Speak, MD  potassium chloride SA (KLOR-CON) 20 MEQ tablet Take 10 mEq by mouth daily.    [provider]  spironolactone (ALDACTONE) 25 MG tablet Take  25 mg by mouth daily.    [provider]  traMADol (ULTRAM) 50 MG tablet Take 1 tablet (50 mg total) by mouth every 6 (six) hours as needed. 02/17/21   Veryl Speak, MD  valsartan (DIOVAN) 160 MG tablet Take 80 mg by mouth 2 (two) times daily.    [provider]  valsartan (DIOVAN) 80 MG tablet Take 1 tablet (80 mg total) by mouth 2 (two) times daily. take one 80 mg tablet twice daily (no longer taking 160 mg 1/2 tablet  twice daily) Thank you. Patient not taking: Reported on 12/26/2019 11/24/19   Deboraha Sprang, MD  XARELTO 20 MG TABS tablet TAKE 1 TABLET BY MOUTH EVERY DAY WITH SUPPER 08/09/20   Deboraha Sprang, MD  zolpidem (AMBIEN) 10 MG tablet Take 1/2 tablet by mouth at bedtime for rest Patient taking differently: Take 5-10 mg by mouth at bedtime as needed for sleep.  12/19/17   Lauree Chandler, NP  zolpidem (AMBIEN) 10 MG tablet Take 5-10 mg by mouth at bedtime as needed for sleep. 03/12/21   [provider]    Allergies    Hydrochlorothiazide, Penicillins, Ciprofloxacin, Doxycycline, Hydrocodone-acetaminophen, Iodine-131, Latex, Penicillins, Statins, Sulfa antibiotics, Carbamazepine, Codeine, Sulfamethoxazole, and Sulfonamide derivatives  Review of Systems   Review of Systems 10 systems reviewed and negative except as per HPI Physical Exam Updated Vital Signs BP (!) 151/92 (BP Location: Left Arm)   Pulse 96   Temp 98.6 F (37 C) (Oral)   Resp 16   SpO2 96%   Physical Exam Constitutional:      Comments: Patient is alert.  She is pleasant.  Patient is well-groomed.  She is in excellent physical condition for age  HENT:     Head: Normocephalic and atraumatic.     Mouth/Throat:     Mouth: Mucous membranes are moist.     Pharynx: Oropharynx is clear.  Eyes:     Extraocular Movements: Extraocular movements intact.     Pupils: Pupils are equal, round, and reactive to light.  Cardiovascular:     Rate and Rhythm: Normal rate.     Comments: Irregularly  irregular. Pulmonary:     Effort: Pulmonary effort is normal.     Breath sounds: Normal breath sounds.  Abdominal:     General: There is no distension.     Palpations: Abdomen is soft.     Tenderness: There is no abdominal tenderness. There is no guarding.  Musculoskeletal:        General: No swelling or tenderness. Normal range of motion.     Right lower leg: No edema.     Left lower leg: No edema.  Skin:    General: Skin is warm and dry.  Neurological:     Comments: Patient is alert and situationally oriented.  She does have difficulty with recall for short-term events.  Follows all commands appropriately.  No focal motor deficits.  Psychiatric:        Mood and Affect: Mood normal.    ED Results / Procedures / Treatments   Labs (all labs ordered are listed, but only abnormal results are displayed) Labs Reviewed  BASIC METABOLIC PANEL  CBC  URINALYSIS, ROUTINE W REFLEX MICROSCOPIC    EKG EKG Interpretation  Date/Time:  Saturday June 11 2021 14:13:46 EDT Ventricular Rate:  91 PR Interval:    QRS Duration: 97 QT Interval:  343 QTC Calculation: 422 R Axis:   -64 Text Interpretation: Atrial fibrillation Left anterior fascicular block Abnormal R-wave progression, late transition Probable LVH with secondary repol abnrm no change from previous Confirmed by Charlesetta Shanks [phone]) on 06/11/2021 2:51:30 PM  Radiology No results found.  Procedures Procedures   Medications Ordered in ED Medications - No data to display  ED Course  I have reviewed the triage vital signs and the nursing notes.  Pertinent labs & imaging results that were available during my care of the patient were reviewed by me and considered in my medical decision making (see chart for details).  MDM Rules/Calculators/A&P                           Patient is clinically well in appearance.  She does seem to have dementia but is functional at home.  She is very well-groomed and in good physical  condition.  Patient does not really recall why she called EMS.  Patient does have history of chronic atrial fibrillation.  Patient was seen about 6 days ago and had some mild AKI.  We will recheck basic labs make sure patient does not have any worsening renal function, silent MI with known cardiac disease or other suggestion of metabolic or infectious condition.  Clinically she is very well in appearance.  Heart rates are ranging from 80s to low 100s.  We will give her home dose of metoprolol.  At this time, if diagnostic work-up unremarkable, anticipate return to home. Final Clinical Impression(s) / ED Diagnoses Final diagnoses:  Confusion    Rx / DC Orders ED Discharge Orders     None        Charlesetta Shanks, MD 06/11/21 1538

## 2021-06-11 NOTE — Discharge Instructions (Addendum)
Please follow-up with your primary care doctor.  Return to the emergency department if any worsening or concerning symptoms

## 2021-06-11 NOTE — ED Notes (Signed)
Patient ambulated to restroom without assistance. This nurse follow in case support was needed.

## 2021-06-11 NOTE — ED Triage Notes (Signed)
Per EMS, Pt, from home, presents because she thinks she has bed bugs in her home.  No bites noted.  EMS reports "this is more of a social visit.  It doesn't look like she has been taking care of herself or taking her medications."  EMS reports she lives w/ her son, but they were unable to contact him.  Pt has no medical complaints.  Pt is confused at baseline.    Pt's son works at [employer] and they recently had a bed bug outbreak.  EMS reports the apartment is very clean.

## 2021-06-11 NOTE — ED Notes (Addendum)
Patient asking for food. MD aware.

## 2021-06-11 NOTE — ED Triage Notes (Signed)
Upon assessment patient complained of insomnia and weakness. Was not able to eat breakfast this morning. Unreliable with recent medication history.

## 2021-06-11 NOTE — ED Notes (Signed)
Spoke with son. Son reports that bed bugs are present in home, but they have not had an exterminator yet.  Was treated for fatigue and dehydration 9/24 at [facility].

## 2021-06-11 NOTE — ED Provider Notes (Signed)
Signout from Dr. Vallery Ridge.  87 year old with dementia here for increased confusion.  Patient's work-up is ongoing.  She has received some blood pressure medication for her hypertension.  If no acute findings with lab work patient can be safely discharged back to her son who helps care for her. Physical Exam  BP (!) 180/107   Pulse 89   Temp 98.6 F (37 C) (Oral)   Resp (!) 22   SpO2 96%   Physical Exam  ED Course/Procedures     Procedures  MDM  Patient's labs did not come back with any significant abnormalities.  Will discharge per plan of Dr. Vallery Ridge.  I did review lab work with patient's son Tiffany Black and he is comfortable plan for discharge.       Tiffany Rasmussen, MD 06/12/21 [phone]

## 2021-06-11 NOTE — ED Notes (Signed)
Pt provided a sandwich and hot tea.

## 2021-06-11 NOTE — ED Notes (Addendum)
Pt is confused/a poor historian.  Pt reports being weak, but ambulated to EMS stretcher and was able to ambulate throughout home (sts she followed her son to the door this morning when he was leaving for work).  Pt reports she is weak because she was unable to fix breakfast this morning and did not sleep well last night.  Sts she takes Ambien to sleep and is unsure what time she took it last night.      EMS reported concerns w/ medication management d/t pill bottles being full and mixed up.  Pt reports managing her own medications.         Pt did not mention bedbugs until it was brought up by this Probation officer.

## 2021-06-14 ENCOUNTER — Other Ambulatory Visit: Payer: Self-pay | Admitting: Internal Medicine

## 2021-06-14 MED ORDER — RIVAROXABAN 15 MG PO TABS: 15.0000 mg | ORAL_TABLET | Freq: Every day | ORAL | 2 refills | Status: DC

## 2021-06-14 NOTE — Telephone Encounter (Signed)
Pt scheduled follow-up appt with Dr Caryl Comes for 09/09/21.  Will refill Xarelto 15mg  QD to pharmacy to get pt to upcoming appt. Pt aware of dosage change.

## 2021-06-26 ENCOUNTER — Ambulatory Visit (HOSPITAL_COMMUNITY)
Admission: EM | Admit: 2021-06-26 | Discharge: 2021-06-26 | Discharge status: Against Medical Advice | Payer: Medicare Other

## 2021-06-26 ENCOUNTER — Other Ambulatory Visit: Payer: Self-pay

## 2021-08-16 ENCOUNTER — Other Ambulatory Visit: Payer: Self-pay | Admitting: Internal Medicine

## 2021-08-16 MED ORDER — FELODIPINE ER 5 MG PO TB24: ORAL_TABLET | ORAL | 0 refills | Status: DC

## 2021-08-16 MED ORDER — RIVAROXABAN 15 MG PO TABS: ORAL_TABLET | ORAL | 0 refills | Status: DC

## 2021-08-16 NOTE — Telephone Encounter (Signed)
*  STAT* If patient is at the pharmacy, call can be transferred to refill team.   1. Which medications need to be refilled? (please list name of each medication and dose if known)  felodipine (PLENDIL) 5 MG 24 hr tablet XARELTO 15 MG TABS tablet  2. Which pharmacy/location (including street and city if local pharmacy) is medication to be sent to? [id number] - [place], [place] - [address] AT [address]  3. Do they need a 30 day or 90 day supply? 30 day

## 2021-08-16 NOTE — Telephone Encounter (Signed)
Pt scheduled follow-up appt with Dr Caryl Comes for 09/09/21.  Will refill Xarelto 15mg  QD to pharmacy to get pt to upcoming appt. Per last refill 06/06/21, still pending follow-up 09/09/21.

## 2021-08-22 ENCOUNTER — Telehealth: Payer: Self-pay | Admitting: Internal Medicine

## 2021-08-22 NOTE — Telephone Encounter (Signed)
Called and spoke with patient. Explained that we have sent a prescription over on 12/6. She has her Xarelto refilled on 11/23 therefore it was too soon to fill. When she has 1 week left advised she call pharmacy to ask them to fill it.

## 2021-08-22 NOTE — Telephone Encounter (Signed)
 *  STAT* If patient is at the pharmacy, call can be transferred to refill team.   1. Which medications need to be refilled? (please list name of each medication and dose if known)   Rivaroxaban (XARELTO) 15 MG TABS tablet  2. Which pharmacy/location (including street and city if local pharmacy) is medication to be sent to?  [id number] - [place], [place] - [address]. AT [address]  3. Do they need a 30 day or 90 day supply? 30 days

## 2021-09-07 DIAGNOSIS — I4891 Unspecified atrial fibrillation: Secondary | ICD-10-CM | POA: Insufficient documentation

## 2021-09-09 ENCOUNTER — Encounter: Payer: Self-pay | Admitting: Internal Medicine

## 2021-09-09 ENCOUNTER — Ambulatory Visit (INDEPENDENT_AMBULATORY_CARE_PROVIDER_SITE_OTHER): Payer: Medicare Other | Admitting: Internal Medicine

## 2021-09-09 ENCOUNTER — Other Ambulatory Visit: Payer: Self-pay

## 2021-09-09 VITALS — Ht 65.0 in | Wt 161.8 lb

## 2021-09-09 DIAGNOSIS — Z79899 Other long term (current) drug therapy: Secondary | ICD-10-CM | POA: Diagnosis not present

## 2021-09-09 DIAGNOSIS — E876 Hypokalemia: Secondary | ICD-10-CM | POA: Diagnosis not present

## 2021-09-09 DIAGNOSIS — I4821 Permanent atrial fibrillation: Secondary | ICD-10-CM | POA: Diagnosis not present

## 2021-09-09 DIAGNOSIS — I951 Orthostatic hypotension: Secondary | ICD-10-CM

## 2021-09-09 NOTE — Progress Notes (Signed)
Patient Care Team: Ginger Organ., MD as PCP - General (Internal Medicine) Deboraha Sprang, MD as Consulting Physician (Cardiology) Shon Hough, MD as Consulting Physician (Ophthalmology) Sanjuana Mae, MD as Referring Physician (Vascular Surgery) Sharmon Revere as Physician Assistant (Cardiology) Ginger Organ., MD (Internal Medicine)   HPI  Tiffany Black is a 87 y.o. female seen in followup for atrial fibrillation now permanent.   Intercurrently diagnosed with dementia; frequently seen in the ER over the last year.  TIA  On anticoagulation w Rivaroxaban No bleeding  Echo 8/16 demonstrated normal LV function and mild LVH and left atrial enlargement Carotid < 40% bilateral stenosis 7/18 Echo normal LV function with LVH   Date Cr K Hgb Mg  4/18  0.81 4.2     10/18  1.03(GFR 48)   4.0 13.5      2/20 0.94 4.0 13.2   10/22 1.19 3.3 13.3 2.2 (9/22)    Today, she is feeling fine overall.   Lately, she states her memory is "better than she'd like it to be."   She believes she has been eating less and losing some weight.  The patient denies chest pain, shortness of breath, nocturnal dyspnea, orthopnea or peripheral edema.  There have been no palpitations, lightheadedness or syncope.    In her regimen, she confirms taking labetalol. She also notes having bottles of meds at home, but is unsure if she is currently taking this.   Thromboembolic risk factors ( age  -2, HTN-1, TIA/CVA-2, Gender-1) for a CHADSVASc Score of > =6   Past Medical History:  Diagnosis Date   Anxiety    Arthritis    Bulging of cervical intervertebral disc    Cardiomegaly    Cataracts, bilateral    Chronic neck pain    History of endometrial cancer    History of stroke    2015 and 2016 // carotid US 8/16: Bilateral 1-39 // carotid US 2/16: Bilateral ICA 1-39   HLD (hyperlipidemia)    Hx of echocardiogram    a. Echo (11/15): Mild LVH, EF 55-60%, normal wall motion,  grade 2 diastolic dysfunction, mild LAE, normal RV function, PASP 29 mm Hg // echo 8/16:Mild concentric LVH, EF 55-60, normal wall motion, grade 2 diastolic dysfunction, mild LAE, PASP 39   Hypertension    Insomnia    Lung nodule    OA (osteoarthritis)    Pectus excavatum    Persistent atrial fibrillation ([facility])    Vertigo     Past Surgical History:  Procedure Laterality Date   ABDOMINAL HYSTERECTOMY  2011   CATARACT EXTRACTION, BILATERAL Bilateral 2009   CESAREAN SECTION  1975   LAPAROSCOPIC HYSTERECTOMY  2011    Current Outpatient Medications  Medication Sig Dispense Refill   acetaminophen (TYLENOL) 325 MG tablet Take 650 mg by mouth every 6 (six) hours as needed for mild pain.     acetaminophen (TYLENOL) 500 MG tablet Take 500 mg by mouth every 6 (six) hours as needed for mild pain or headache.     calcium-vitamin D (OSCAL WITH D) 500-200 MG-UNIT tablet Take 1 tablet by mouth.     Cholecalciferol (VITAMIN D-3 PO) Take 1 capsule by mouth daily.     DIOVAN 160 MG tablet Take 80 mg by mouth 2 (two) times daily.     felodipine (PLENDIL) 5 MG 24 hr tablet TAKE 1 TABLET BY MOUTH EVERY DAY . Please keep upcoming appt in December 2022 with  Dr. Caryl Comes before anymore refills. Thank you Final Attempt 30 tablet 0   FLUoxetine (PROZAC) 10 MG capsule Take 10 mg by mouth daily.     FLUoxetine (PROZAC) 10 MG tablet Take 1 tablet (10 mg total) by mouth every other day. (Patient taking differently: Take 10 mg by mouth daily.) 90 tablet 1   labetalol (NORMODYNE) 200 MG tablet TAKE 1 TABLET EVERY MORNING AND TAKE 1 AND 1/2 TABLETS AT NIGHT *[address]-84*. Please make yearly appt with Dr. Caryl Comes for March 2022 for future refills. Thank you 1st attempt 225 tablet 0   labetalol (NORMODYNE) 200 MG tablet Take 200-300 mg by mouth in the morning and at bedtime. 200mg  in the morning and 300mg  at bedtime     meclizine (ANTIVERT) 25 MG tablet Take 25 mg by mouth 3 (three) times daily as needed for dizziness.      metoprolol tartrate (LOPRESSOR) 50 MG tablet Take 1 tablet (50 mg total) by mouth 2 (two) times daily. (Patient taking differently: Take 25 mg by mouth 2 (two) times daily.) 180 tablet 2   metoprolol tartrate (LOPRESSOR) 50 MG tablet Take 25 mg by mouth 2 (two) times daily.     potassium chloride SA (KLOR-CON) 20 MEQ tablet Take 10 mEq by mouth daily.     Rivaroxaban (XARELTO) 15 MG TABS tablet TAKE 1 TABLET BY MOUTH DAILY WITH SUPPER. OVERDUE FOR FOLLOW-UP, MUST SEE MD FOR FUTURE REFILLS. 30 tablet 0   spironolactone (ALDACTONE) 25 MG tablet Take 25 mg by mouth daily.     traMADol (ULTRAM) 50 MG tablet Take 1 tablet (50 mg total) by mouth every 6 (six) hours as needed. 15 tablet 0   valsartan (DIOVAN) 160 MG tablet Take 80 mg by mouth 2 (two) times daily.     zolpidem (AMBIEN) 10 MG tablet Take 1/2 tablet by mouth at bedtime for rest (Patient taking differently: Take 5-10 mg by mouth at bedtime as needed for sleep.) 15 tablet 0   zolpidem (AMBIEN) 10 MG tablet Take 5-10 mg by mouth at bedtime as needed for sleep.     KLOR-CON M20 20 MEQ tablet TAKE 1/2 TABLET BY MOUTH ONCE DAILY (Patient not taking: No sig reported) 15 tablet 6   meclizine (ANTIVERT) 12.5 MG tablet Take 1 tablet (12.5 mg total) by mouth 3 (three) times daily as needed for dizziness. (Patient not taking: Reported on 08/03/2020) 8 tablet 0   oxyCODONE-acetaminophen (PERCOCET) 5-325 MG tablet Take 1-2 tablets by mouth every 6 (six) hours as needed. (Patient not taking: Reported on 12/26/2019) 20 tablet 0   valsartan (DIOVAN) 80 MG tablet Take 1 tablet (80 mg total) by mouth 2 (two) times daily. take one 80 mg tablet twice daily (no longer taking 160 mg 1/2 tablet twice daily) Thank you. (Patient not taking: Reported on 12/26/2019) 60 tablet 5   No current facility-administered medications for this visit.    Allergies  Allergen Reactions   Hydrochlorothiazide Other (See Comments)    Patient doesn't recall the reaction   Penicillins  Hives    Has patient had a PCN reaction causing immediate rash, facial/tongue/throat swelling, SOB or lightheadedness with hypotension: Yes Has patient had a PCN reaction causing severe rash involving mucus membranes or skin necrosis: No Has patient had a PCN reaction that required hospitalization No Has patient had a PCN reaction occurring within the last 10 years: No If all of the above answers are "NO", then may proceed with Cephalosporin use.    Ciprofloxacin Other (See  Comments)    Blood pressure issues- patient doesn't recall if it increased OR decreased it   Doxycycline Diarrhea and Nausea And Vomiting   Hydrocodone-Acetaminophen Other (See Comments)    Unknown reaction   Iodine-131 Other (See Comments) and Hypertension    Raised the blood pressure   Latex Itching   Penicillins    Statins Other (See Comments)    Muscle weakness   Sulfa Antibiotics    Carbamazepine Other (See Comments) and Rash    Flushed blood pressure medication out of system   Codeine Nausea And Vomiting    Nausea and vomiting   Sulfamethoxazole Hives and Rash   Sulfonamide Derivatives Hives and Rash         Review of Systems negative except from HPI and PMH  Physical Exam Ht 5\' 5"  (1.651 m)    Wt 161 lb 12.8 oz (73.4 kg)    SpO2 96%    BMI 26.92 kg/m  Well developed and nourished in no acute distress HENT normal Neck supple with JVP-  flat  Irregularly Irregular rate and rhythm with slow  controlled  ventricular response , no murmurs or gallops Abd-soft with active BS No Clubbing cyanosis edema Skin-warm and dry A & Oriented  Grossly normal sensory and motor function  ECG atrial fibrillation at 77 Intervals-/09/39 Left axis deviation -57 Poor R wave progression    Assessment and  Plan  Hypertension   Atrial fibrillation - permanent   TIA   Renal insufficiency grade 3   Anxiety  Hypokalemia  Orthostatic hypotension    MAR is unfortunately confusing with multiple duplications  both of the same prescription as well as many different medications in the same class.  We will need to clarify what it is that she is taking.  For right now cannot make any changes.  Thankfully her orthostasis is not significantly problematic.  Atrial fibrillation seems to be rate controlled; she is without symptoms.  We will continue her Xarelto at 15 mg.  Again need to clarify rate control if she is listed to be on 2 different beta-blockers.  She is hypokalemic again.  She has been started on spironolactone 25 mg daily.  We will continue it although it is not clear whether she is taking it, (see above).  We will check her potassium.  May need to reach out to her PCP.    I,Mathew Stumpf,acting as a scribe for Virl Axe, MD.,have documented all relevant documentation on the behalf of Virl Axe, MD,as directed by  Virl Axe, MD while in the presence of Virl Axe, MD.  I, Virl Axe, MD, have reviewed all documentation for this visit. The documentation on 09/09/21 for the exam, diagnosis, procedures, and orders are all accurate and complete.

## 2021-09-09 NOTE — Patient Instructions (Signed)
Medication Instructions:  Your physician recommends that you continue on your current medications as directed. Please refer to the Current Medication list given to you today.  *If you need a refill on your cardiac medications before your next appointment, please call your pharmacy*   Lab Work: BMET today If you have labs (blood work) drawn today and your tests are completely normal, you will receive your results only by: [place] (if you have MyChart) OR A paper copy in the mail If you have any lab test that is abnormal or we need to change your treatment, we will call you to review the results.   Testing/Procedures: None ordered.    Follow-Up: At [facility], you and your health needs are our priority.  As part of our continuing mission to provide you with exceptional heart care, we have created designated Provider Care Teams.  These Care Teams include your primary Cardiologist (physician) and Advanced Practice Providers (APPs -  Physician Assistants and Nurse Practitioners) who all work together to provide you with the care you need, when you need it.  We recommend signing up for the patient portal called "MyChart".  Sign up information is provided on this After Visit Summary.  MyChart is used to connect with patients for Virtual Visits (Telemedicine).  Patients are able to view lab/test results, encounter notes, upcoming appointments, etc.  Non-urgent messages can be sent to your provider as well.   To learn more about what you can do with MyChart, go to [email or web address].    Your next appointment:   6 months with Dr Caryl Comes

## 2021-09-10 LAB — BASIC METABOLIC PANEL
BUN/Creatinine Ratio: 14 (ref 12–28)
BUN: 19 mg/dL (ref 8–27)
CO2: 25 mmol/L (ref 20–29)
Calcium: 9.4 mg/dL (ref 8.7–10.3)
Chloride: 106 mmol/L (ref 96–106)
Creatinine, Ser: 1.38 mg/dL — ABNORMAL HIGH (ref 0.57–1.00)
Glucose: 88 mg/dL (ref 70–99)
Potassium: 4.1 mmol/L (ref 3.5–5.2)
Sodium: 143 mmol/L (ref 134–144)
eGFR: 38 mL/min/{1.73_m2} — ABNORMAL LOW (ref >59)

## 2021-09-21 ENCOUNTER — Telehealth: Payer: Self-pay

## 2021-09-21 NOTE — Telephone Encounter (Signed)
Spoke with pt and advised per Dr Caryl Comes labs are normal with exception of chronically elevated Creatinine.  Pt verbalizes understanding and thanked Therapist, sports for the call

## 2021-09-21 NOTE — Telephone Encounter (Signed)
-----   Message from Deboraha Sprang, MD sent at 09/17/2021  6:57 PM EST ----- Please Inform Patient  Labs are normal x chronically  eleveated CR  Thanks

## 2021-10-28 ENCOUNTER — Other Ambulatory Visit: Payer: Self-pay | Admitting: *Deleted

## 2021-10-28 DIAGNOSIS — I4819 Other persistent atrial fibrillation: Secondary | ICD-10-CM

## 2021-10-28 MED ORDER — RIVAROXABAN 15 MG PO TABS: ORAL_TABLET | ORAL | 2 refills | Status: DC

## 2021-10-28 NOTE — Telephone Encounter (Signed)
Xarelto 15mg  paper refill request received. Pt is 88 years old, weight-73.4kg, Crea-1.38 on 09/09/2021, last seen by Dr Caryl Comes on 09/09/2021, Diagnosis-Afib, CrCl-34.57ml/min; Dose is appropriate based on dosing criteria. Will send in refill to requested pharmacy.

## 2021-11-08 ENCOUNTER — Ambulatory Visit: Payer: Medicare Other | Admitting: Internal Medicine

## 2021-11-28 ENCOUNTER — Encounter (HOSPITAL_BASED_OUTPATIENT_CLINIC_OR_DEPARTMENT_OTHER): Payer: Self-pay | Admitting: Emergency Medicine

## 2021-11-28 ENCOUNTER — Other Ambulatory Visit: Payer: Self-pay

## 2021-11-28 ENCOUNTER — Emergency Department (HOSPITAL_BASED_OUTPATIENT_CLINIC_OR_DEPARTMENT_OTHER): Payer: Medicare Other

## 2021-11-28 ENCOUNTER — Emergency Department (HOSPITAL_BASED_OUTPATIENT_CLINIC_OR_DEPARTMENT_OTHER)
Admission: EM | Admit: 2021-11-28 | Discharge: 2021-11-28 | Discharge status: Against Medical Advice | Payer: Medicare Other | Attending: Emergency Medicine | Admitting: Emergency Medicine

## 2021-11-28 DIAGNOSIS — Z8541 Personal history of malignant neoplasm of cervix uteri: Secondary | ICD-10-CM | POA: Diagnosis not present

## 2021-11-28 DIAGNOSIS — Z79899 Other long term (current) drug therapy: Secondary | ICD-10-CM | POA: Insufficient documentation

## 2021-11-28 DIAGNOSIS — L299 Pruritus, unspecified: Secondary | ICD-10-CM | POA: Insufficient documentation

## 2021-11-28 DIAGNOSIS — I10 Essential (primary) hypertension: Secondary | ICD-10-CM | POA: Insufficient documentation

## 2021-11-28 DIAGNOSIS — R42 Dizziness and giddiness: Secondary | ICD-10-CM | POA: Diagnosis not present

## 2021-11-28 DIAGNOSIS — R7989 Other specified abnormal findings of blood chemistry: Secondary | ICD-10-CM

## 2021-11-28 DIAGNOSIS — N390 Urinary tract infection, site not specified: Secondary | ICD-10-CM

## 2021-11-28 DIAGNOSIS — R0600 Dyspnea, unspecified: Secondary | ICD-10-CM | POA: Diagnosis not present

## 2021-11-28 DIAGNOSIS — R791 Abnormal coagulation profile: Secondary | ICD-10-CM | POA: Diagnosis not present

## 2021-11-28 DIAGNOSIS — Z91040 Latex allergy status: Secondary | ICD-10-CM | POA: Insufficient documentation

## 2021-11-28 DIAGNOSIS — R0781 Pleurodynia: Secondary | ICD-10-CM

## 2021-11-28 DIAGNOSIS — F0390 Unspecified dementia without behavioral disturbance: Secondary | ICD-10-CM | POA: Insufficient documentation

## 2021-11-28 DIAGNOSIS — R072 Precordial pain: Secondary | ICD-10-CM | POA: Diagnosis present

## 2021-11-28 LAB — COMPREHENSIVE METABOLIC PANEL
ALT: 12 U/L (ref 0–44)
AST: 19 U/L (ref 15–41)
Albumin: 4.2 g/dL (ref 3.5–5.0)
Alkaline Phosphatase: 69 U/L (ref 38–126)
Anion gap: 10 (ref 5–15)
BUN: 17 mg/dL (ref 8–23)
CO2: 26 mmol/L (ref 22–32)
Calcium: 10 mg/dL (ref 8.9–10.3)
Chloride: 105 mmol/L (ref 98–111)
Creatinine, Ser: 1.25 mg/dL — ABNORMAL HIGH (ref 0.44–1.00)
GFR, Estimated: 42 mL/min — ABNORMAL LOW (ref >60)
Glucose, Bld: 94 mg/dL (ref 70–99)
Potassium: 3.9 mmol/L (ref 3.5–5.1)
Sodium: 141 mmol/L (ref 135–145)
Total Bilirubin: 0.8 mg/dL (ref 0.3–1.2)
Total Protein: 6.8 g/dL (ref 6.5–8.1)

## 2021-11-28 LAB — URINALYSIS, ROUTINE W REFLEX MICROSCOPIC
Bilirubin Urine: NEGATIVE
Glucose, UA: NEGATIVE mg/dL
Hgb urine dipstick: NEGATIVE
Ketones, ur: NEGATIVE mg/dL
Nitrite: NEGATIVE
Specific Gravity, Urine: 1.009 (ref 1.005–1.030)
pH: 7 (ref 5.0–8.0)

## 2021-11-28 LAB — CBC
HCT: 38.8 % (ref 36.0–46.0)
Hemoglobin: 12.7 g/dL (ref 12.0–15.0)
MCH: 30.2 pg (ref 26.0–34.0)
MCHC: 32.7 g/dL (ref 30.0–36.0)
MCV: 92.4 fL (ref 80.0–100.0)
Platelets: 182 10*3/uL (ref 150–400)
RBC: 4.2 MIL/uL (ref 3.87–5.11)
RDW: 14.1 % (ref 11.5–15.5)
WBC: 6.6 10*3/uL (ref 4.0–10.5)
nRBC: 0 % (ref 0.0–0.2)

## 2021-11-28 LAB — TSH: TSH: 3.55 u[IU]/mL (ref 0.350–4.500)

## 2021-11-28 LAB — D-DIMER, QUANTITATIVE: D-Dimer, Quant: 1.44 ug/mL-FEU — ABNORMAL HIGH (ref 0.00–0.50)

## 2021-11-28 LAB — TROPONIN I (HIGH SENSITIVITY)
Troponin I (High Sensitivity): 7 ng/L (ref <18)
Troponin I (High Sensitivity): 7 ng/L (ref <18)

## 2021-11-28 LAB — MAGNESIUM: Magnesium: 2 mg/dL (ref 1.7–2.4)

## 2021-11-28 MED ORDER — CEPHALEXIN 500 MG PO CAPS: 500.0000 mg | ORAL_CAPSULE | Freq: Two times a day (BID) | ORAL | 0 refills | Status: AC

## 2021-11-28 NOTE — ED Notes (Signed)
Patient ambulated to bathroom well w/o being dizzy. Was not able to provide urine sample at this time. ?

## 2021-11-28 NOTE — ED Notes (Signed)
Attempted to reach patient and son regarding transfer to [facility] hospital as they are still listed on the board at [facility]. No answer on son's phone at this time. Left voicemail for call back as patient has an IV secured for POV transfer.  ?

## 2021-11-28 NOTE — ED Notes (Signed)
Pt tried using the bathroom again but was not able to go. Pt given a cup of water to help. ?

## 2021-11-28 NOTE — ED Notes (Signed)
Bed bug found by radiology on patient clothing and in bed. One captured for Terminex.  ?

## 2021-11-28 NOTE — ED Notes (Addendum)
Patient's son called back and states on the way to [place] the patient stated "I am just old and am going to die at some point and I am not worried about blood clots." So he took her home. RN explained to patient's son that she had an IV in her arm. Patient heard on the phone and stated "No I don't." RN had secured IV for POV transport. Patient stated she didn't have it in now. Patient's son reports she must have removed it at some point. RN explained that this is a safety issue and now patient is considered to be AMA without having received the scan and for having the IV in her arm as this is a safety issue. As patient has a hx of dementia, [facility] called and safety issue reported. They report they will send a medic or officer out for a 'safety/wellness' check to assess for IV being in place or lack of IV and assess the safety.  ?Patient's son was originally going to wait until the morning and potentially take her to [facility]. RN explained that this again is a safety issue and she is considered AMA at this point for not going to [facility].  ?

## 2021-11-28 NOTE — ED Notes (Signed)
Patient is unable to get CT scan at this facility due to the bed bugs. CT scan per radiology would have to be closed down for four hours and we would be unable to scan any patients during that time.  ?Charge RN made aware.  ?

## 2021-11-28 NOTE — ED Triage Notes (Signed)
Pt arrives to ED via [facility] with c/o dizziness and anxiety. Pt reports she woke up three times last night "not feeling right." She reports she felt "jittery" and dizzy. The dizziness improved this morning. She reports she feel anxious and wants to be evaluated.  ?

## 2021-11-28 NOTE — Discharge Instructions (Addendum)
It was a pleasure caring for you today in the emergency department. ° °Please return to the emergency department for any worsening or worrisome symptoms. ° ° °

## 2021-11-28 NOTE — ED Provider Notes (Signed)
?[place] EMERGENCY DEPT ?Provider Note ? ? ?CSN: [id number] ?Arrival date & time: 11/28/21  1044 ? ?  ? ?History ? ?Chief Complaint  ?Patient presents with  ? Dizziness  ? ? ?Tiffany Black is a 88 y.o. female. ? ?This is a 88 y.o. female with significant medical history as below, including hypertension, cardiomegaly, prior CVA who presents to the ED with complaint of dyspnea.  Itching. ? ?Patient reports difficulty with complete inspiration, pleuritic chest pain reported onset symptoms approximately 24 to 48 hours ago. Occurs periodically during this time. Worse w/ deep inspiration.  No history of DVT or PE.  No oral hormone use.  No recent travel or sick contacts.  No fevers, chills, nausea or vomiting.  No change to diet or daily medications.  No change in bowel or bladder function, she has not experienced the symptoms in the past.  Patient does report diffuse itching, no rashes no recent changes to her skin care products or soaps, detergents ? ?She is poor historian, hx dementia ? ?Past Medical History: ?No date: Anxiety ?No date: Arthritis ?No date: Bulging of cervical intervertebral disc ?No date: Cardiomegaly ?No date: Cataracts, bilateral ?No date: Chronic neck pain ?No date: History of endometrial cancer ?No date: History of stroke ?    Comment:  2015 and 2016 // carotid US 8/16: Bilateral 1-39 //  ?             carotid US 2/16: Bilateral ICA 1-39 ?No date: HLD (hyperlipidemia) ?No date: Hx of echocardiogram ?    Comment:  a. Echo (11/15): Mild LVH, EF 55-60%, normal wall  ?             motion, grade 2 diastolic dysfunction, mild LAE, normal  ?             RV function, PASP 29 mm Hg // echo 8/16:Mild concentric  ?             LVH, EF 55-60, normal wall motion, grade 2 diastolic  ?             dysfunction, mild LAE, PASP 39 ?No date: Hypertension ?No date: Insomnia ?No date: Lung nodule ?No date: OA (osteoarthritis) ?No date: Pectus excavatum ?No date: Persistent atrial fibrillation ([place]) ?No  date: Vertigo ? ?Past Surgical History: ?2011: ABDOMINAL HYSTERECTOMY ?2009: CATARACT EXTRACTION, BILATERAL; Bilateral ?1975: CESAREAN SECTION ?2011: LAPAROSCOPIC HYSTERECTOMY  ? ? ?The history is provided by the patient. The history is limited by the condition of the patient. No language interpreter was used.  ?Dizziness ?Associated symptoms: chest pain and shortness of breath   ?Associated symptoms: no headaches, no nausea, no palpitations and no vomiting   ? ?  ? ?Home Medications ?Prior to Admission medications   ?Medication Sig Start Date End Date Taking? Authorizing Provider  ?cephALEXin (KEFLEX) 500 MG capsule Take 1 capsule (500 mg total) by mouth 2 (two) times daily for 7 days. 11/28/21 12/05/21 Yes Jeanell Sparrow, DO  ?acetaminophen (TYLENOL) 325 MG tablet Take 650 mg by mouth every 6 (six) hours as needed for mild pain.    [provider]  ?acetaminophen (TYLENOL) 500 MG tablet Take 500 mg by mouth every 6 (six) hours as needed for mild pain or headache.    [provider]  ?calcium-vitamin D (OSCAL WITH D) 500-200 MG-UNIT tablet Take 1 tablet by mouth.    [provider]  ?Cholecalciferol (VITAMIN D-3 PO) Take 1 capsule by mouth daily.    [provider]  ?  DIOVAN 160 MG tablet Take 80 mg by mouth 2 (two) times daily. 02/16/21   [provider]  ?felodipine (PLENDIL) 5 MG 24 hr tablet TAKE 1 TABLET BY MOUTH EVERY DAY . Please keep upcoming appt in December 2022 with Dr. Caryl Comes before anymore refills. Thank you Final Attempt 08/16/21   Deboraha Sprang, MD  ?FLUoxetine (PROZAC) 10 MG capsule Take 10 mg by mouth daily. 01/14/21   [provider]  ?FLUoxetine (PROZAC) 10 MG tablet Take 1 tablet (10 mg total) by mouth every other day. ?Patient taking differently: Take 10 mg by mouth daily. 01/27/19   Deboraha Sprang, MD  ?KLOR-CON M20 20 MEQ tablet TAKE 1/2 TABLET BY MOUTH ONCE DAILY ?Patient not taking: No sig reported 10/03/17   Lauree Chandler, NP  ?labetalol  (NORMODYNE) 200 MG tablet TAKE 1 TABLET EVERY MORNING AND TAKE 1 AND 1/2 TABLETS AT NIGHT [phone]*. Please make yearly appt with Dr. Caryl Comes for March 2022 for future refills. Thank you 1st attempt 08/20/20   Deboraha Sprang, MD  ?labetalol (NORMODYNE) 200 MG tablet Take 200-300 mg by mouth in the morning and at bedtime. '200mg'$  in the morning and '300mg'$  at bedtime 02/05/21   [provider]  ?meclizine (ANTIVERT) 12.5 MG tablet Take 1 tablet (12.5 mg total) by mouth 3 (three) times daily as needed for dizziness. ?Patient not taking: Reported on 08/03/2020 12/26/19   Quintella Reichert, MD  ?meclizine (ANTIVERT) 25 MG tablet Take 25 mg by mouth 3 (three) times daily as needed for dizziness.    [provider]  ?metoprolol tartrate (LOPRESSOR) 50 MG tablet Take 1 tablet (50 mg total) by mouth 2 (two) times daily. ?Patient taking differently: Take 25 mg by mouth 2 (two) times daily. 04/12/20   Deboraha Sprang, MD  ?metoprolol tartrate (LOPRESSOR) 50 MG tablet Take 25 mg by mouth 2 (two) times daily. 12/22/20   [provider]  ?oxyCODONE-acetaminophen (PERCOCET) 5-325 MG tablet Take 1-2 tablets by mouth every 6 (six) hours as needed. ?Patient not taking: Reported on 12/26/2019 04/29/19   Veryl Speak, MD  ?potassium chloride SA (KLOR-CON) 20 MEQ tablet Take 10 mEq by mouth daily.    [provider]  ?Rivaroxaban (XARELTO) 15 MG TABS tablet TAKE 1 TABLET BY MOUTH DAILY WITH SUPPER. 10/28/21   Deboraha Sprang, MD  ?spironolactone (ALDACTONE) 25 MG tablet Take 25 mg by mouth daily.    [provider]  ?traMADol (ULTRAM) 50 MG tablet Take 1 tablet (50 mg total) by mouth every 6 (six) hours as needed. 02/17/21   Veryl Speak, MD  ?valsartan (DIOVAN) 160 MG tablet Take 80 mg by mouth 2 (two) times daily.    [provider]  ?valsartan (DIOVAN) 80 MG tablet Take 1 tablet (80 mg total) by mouth 2 (two) times daily. take one 80 mg tablet twice daily (no longer taking 160 mg 1/2  tablet twice daily) Thank you. ?Patient not taking: Reported on 12/26/2019 11/24/19   Deboraha Sprang, MD  ?zolpidem (AMBIEN) 10 MG tablet Take 1/2 tablet by mouth at bedtime for rest ?Patient taking differently: Take 5-10 mg by mouth at bedtime as needed for sleep. 12/19/17   Lauree Chandler, NP  ?zolpidem (AMBIEN) 10 MG tablet Take 5-10 mg by mouth at bedtime as needed for sleep. 03/12/21   [provider]  ?   ? ?Allergies    ?Hydrochlorothiazide, Penicillins, Ciprofloxacin, Doxycycline, Hydrocodone-acetaminophen, Iodine-131, Latex, Penicillins, Statins, Sulfa antibiotics, Carbamazepine, Codeine, Sulfamethoxazole, and  Sulfonamide derivatives   ? ?Review of Systems   ?Review of Systems  ?Constitutional:  Negative for chills and fever.  ?HENT:  Negative for facial swelling and trouble swallowing.   ?Eyes:  Negative for photophobia and visual disturbance.  ?Respiratory:  Positive for shortness of breath. Negative for cough.   ?Cardiovascular:  Positive for chest pain. Negative for palpitations.  ?Gastrointestinal:  Negative for abdominal pain, nausea and vomiting.  ?Endocrine: Negative for polydipsia and polyuria.  ?Genitourinary:  Negative for difficulty urinating and hematuria.  ?Musculoskeletal:  Negative for gait problem and joint swelling.  ?Skin:  Negative for pallor and rash.  ?     itchy  ?Neurological:  Negative for dizziness, syncope, numbness and headaches.  ?Psychiatric/Behavioral:  Negative for agitation and confusion.   ? ?Physical Exam ?Updated Vital Signs ?BP (!) 175/114 (BP Location: Right Arm)   Pulse 91   Temp 98.9 ?F (37.2 ?C) (Oral)   Resp (!) 23   Ht '5\' 5"'$  (1.651 m)   Wt 73.4 kg   SpO2 100%   BMI 26.93 kg/m?  ?Physical Exam ?Vitals and nursing note reviewed.  ?Constitutional:   ?   General: She is not in acute distress. ?   Appearance: Normal appearance.  ?HENT:  ?   Head: Normocephalic and atraumatic.  ?   Right Ear: External ear normal.  ?   Left Ear: External ear normal.  ?    Nose: Nose normal.  ?   Mouth/Throat:  ?   Mouth: Mucous membranes are moist.  ?Eyes:  ?   General: No scleral icterus.    ?   Right eye: No discharge.     ?   Left eye: No discharge.  ?   Extraocular Movements: E

## 2021-11-28 NOTE — ED Notes (Signed)
Patient to be transferred to [facility] hospital POV by son for CTA. Patient's son aware of the need. Patient reports he is taking her there now. 20g IV secured with [place] for POV transfer. Patient in no active distress at this time.

## 2021-12-02 ENCOUNTER — Telehealth: Payer: Self-pay | Admitting: *Deleted

## 2021-12-02 LAB — URINE CULTURE: Culture: 20000 — AB

## 2021-12-02 NOTE — Telephone Encounter (Signed)
Post ED Visit - Positive Culture Follow-up ? ?Culture report reviewed by antimicrobial stewardship pharmacist: ?[place] Team ?'[]'$  Elenor Quinones, Pharm.D. ?'[]'$  Heide Guile, Pharm.D., BCPS AQ-ID ?'[]'$  Parks Neptune, Pharm.D., BCPS ?'[]'$  Alycia Rossetti, Pharm.D., BCPS ?'[]'$  [place], Pharm.D., BCPS, AAHIVP ?'[]'$  Legrand Como, Pharm.D., BCPS, AAHIVP ?'[]'$  Salome Arnt, PharmD, BCPS ?'[]'$  Johnnette Gourd, PharmD, BCPS ?'[]'$  Hughes Better, PharmD, BCPS ?'[]'$  Leeroy Cha, PharmD ?'[]'$  Laqueta Linden, PharmD, BCPS ?'[]'$  Albertina Parr, PharmD ? ?[place] Team ?'[]'$  Leodis Sias, PharmD ?'[]'$  Lindell Spar, PharmD ?'[]'$  Royetta Asal, PharmD ?'[]'$  Graylin Shiver, Rph ?'[]'$  Rema Fendt) Glennon Mac, PharmD ?'[]'$  Arlyn Dunning, PharmD ?'[]'$  Netta Cedars, PharmD ?'[]'$  Dia Sitter, PharmD ?'[]'$  Leone Haven, PharmD ?'[]'$  Gretta Arab, PharmD ?'[]'$  Theodis Shove, PharmD ?'[]'$  Peggyann Juba, PharmD ?'[]'$  Reuel Boom, PharmD ? ? ?Positive urine culture ?Treated with Cephalexin, organism sensitive to the same and no further patient follow-up is required at this time.  Laurey Arrow, PharmD ? ?Ardeen Fillers ?12/02/2021, 9:21 AM ?  ?

## 2021-12-03 ENCOUNTER — Telehealth: Payer: Self-pay | Admitting: Emergency Medicine

## 2021-12-03 NOTE — Telephone Encounter (Signed)
Post ED Visit - Positive Culture Follow-up ? ?Culture report reviewed by antimicrobial stewardship pharmacist: ?[place] Team ?'[]'$  Elenor Quinones, Pharm.D. ?'[]'$  Heide Guile, Pharm.D., BCPS AQ-ID ?'[]'$  Parks Neptune, Pharm.D., BCPS ?'[]'$  Alycia Rossetti, Pharm.D., BCPS ?'[]'$  [place], Pharm.D., BCPS, AAHIVP ?'[]'$  Legrand Como, Pharm.D., BCPS, AAHIVP ?'[]'$  Salome Arnt, PharmD, BCPS ?'[]'$  Johnnette Gourd, PharmD, BCPS ?'[]'$  Hughes Better, PharmD, BCPS ?'[x]'$  Lorelei Pont, PharmD ?'[]'$  Laqueta Linden, PharmD, BCPS ?'[]'$  Albertina Parr, PharmD ? ?[place] Team ?'[]'$  Leodis Sias, PharmD ?'[]'$  Lindell Spar, PharmD ?'[]'$  Royetta Asal, PharmD ?'[]'$  Graylin Shiver, Rph ?'[]'$  Rema Fendt) Glennon Mac, PharmD ?'[]'$  Arlyn Dunning, PharmD ?'[]'$  Netta Cedars, PharmD ?'[]'$  Dia Sitter, PharmD ?'[]'$  Leone Haven, PharmD ?'[]'$  Gretta Arab, PharmD ?'[]'$  Theodis Shove, PharmD ?'[]'$  Peggyann Juba, PharmD ?'[]'$  Reuel Boom, PharmD ? ? ?Positive urine culture ?Treated with Cephalexin, organism sensitive to the same and no further patient follow-up is required at this time. ? ?Milus Mallick ?12/03/2021, 9:45 PM ?  ?

## 2021-12-09 ENCOUNTER — Encounter (HOSPITAL_COMMUNITY): Payer: Self-pay | Admitting: Emergency Medicine

## 2021-12-09 ENCOUNTER — Ambulatory Visit (HOSPITAL_COMMUNITY)
Admission: EM | Admit: 2021-12-09 | Discharge: 2021-12-09 | Disposition: A | Discharge status: Home / Self Care | Payer: Medicare Other | Attending: Emergency Medicine | Admitting: Emergency Medicine

## 2021-12-09 DIAGNOSIS — L239 Allergic contact dermatitis, unspecified cause: Secondary | ICD-10-CM

## 2021-12-09 DIAGNOSIS — L509 Urticaria, unspecified: Secondary | ICD-10-CM | POA: Diagnosis not present

## 2021-12-09 MED ORDER — CETIRIZINE HCL 10 MG PO TABS: 10.0000 mg | ORAL_TABLET | Freq: Every day | ORAL | 0 refills | Status: DC

## 2021-12-09 MED ORDER — PREDNISONE 10 MG PO TABS: 20.0000 mg | ORAL_TABLET | Freq: Every day | ORAL | 0 refills | Status: DC

## 2021-12-09 MED ORDER — TRIAMCINOLONE ACETONIDE 0.1 % EX CREA: 1.0000 "application " | TOPICAL_CREAM | Freq: Two times a day (BID) | CUTANEOUS | 0 refills | Status: DC

## 2021-12-09 NOTE — Discharge Instructions (Addendum)
Take medications as directed.  Continue to consider the cause of the reaction.  Try not to itch or scratch.  Use cool packs to the area.  Return if new or worsening symptoms at any time for reevaluation ?

## 2021-12-09 NOTE — ED Notes (Signed)
This Probation officer spoke to the patient's son. Per [facility] provider request. Patient's son Vernelle Wisner) reports " she normally drivers herself, she has a little confusion and early onset dementia but she's able to drive herself and gets her own food". This Probation officer stated to son that UC staff was concerned about her cognition and wanted to verify she is safe to drive herself home. Patients son Gerald Stabs states) " she is able to drive herself home safely".  ? ?

## 2021-12-09 NOTE — ED Provider Notes (Signed)
?[facility] ? ? ?MRN: [id number] DOB: 1934/08/18 ? ?Subjective:  ? ?Chief Complaint;  ?Chief Complaint  ?Patient presents with  ? Rash  ? ? ?Tiffany Black is a 88 y.o. female presenting for itchy rash to posterior neck since last night.  Patient denies new products or exposures that may have caused the rash.  She denies fevers ? ?No current facility-administered medications for this encounter. ? ?Current Outpatient Medications:  ?  cetirizine (ZYRTEC ALLERGY) 10 MG tablet, Take 1 tablet (10 mg total) by mouth daily., Disp: 20 tablet, Rfl: 0 ?  predniSONE (DELTASONE) 10 MG tablet, Take 2 tablets (20 mg total) by mouth daily., Disp: 15 tablet, Rfl: 0 ?  triamcinolone cream (KENALOG) 0.1 %, Apply 1 application. topically 2 (two) times daily., Disp: 30 g, Rfl: 0 ?  acetaminophen (TYLENOL) 325 MG tablet, Take 650 mg by mouth every 6 (six) hours as needed for mild pain., Disp: , Rfl:  ?  acetaminophen (TYLENOL) 500 MG tablet, Take 500 mg by mouth every 6 (six) hours as needed for mild pain or headache., Disp: , Rfl:  ?  calcium-vitamin D (OSCAL WITH D) 500-200 MG-UNIT tablet, Take 1 tablet by mouth., Disp: , Rfl:  ?  Cholecalciferol (VITAMIN D-3 PO), Take 1 capsule by mouth daily., Disp: , Rfl:  ?  DIOVAN 160 MG tablet, Take 80 mg by mouth 2 (two) times daily., Disp: , Rfl:  ?  felodipine (PLENDIL) 5 MG 24 hr tablet, TAKE 1 TABLET BY MOUTH EVERY DAY . Please keep upcoming appt in December 2022 with Dr. Caryl Comes before anymore refills. Thank you Final Attempt, Disp: 30 tablet, Rfl: 0 ?  FLUoxetine (PROZAC) 10 MG capsule, Take 10 mg by mouth daily., Disp: , Rfl:  ?  FLUoxetine (PROZAC) 10 MG tablet, Take 1 tablet (10 mg total) by mouth every other day. (Patient taking differently: Take 10 mg by mouth daily.), Disp: 90 tablet, Rfl: 1 ?  KLOR-CON M20 20 MEQ tablet, TAKE 1/2 TABLET BY MOUTH ONCE DAILY (Patient not taking: No sig reported), Disp: 15 tablet, Rfl: 6 ?  labetalol (NORMODYNE) 200 MG tablet, TAKE 1  TABLET EVERY MORNING AND TAKE 1 AND 1/2 TABLETS AT NIGHT *[address]-83*. Please make yearly appt with Dr. Caryl Comes for March 2022 for future refills. Thank you 1st attempt, Disp: 225 tablet, Rfl: 0 ?  labetalol (NORMODYNE) 200 MG tablet, Take 200-300 mg by mouth in the morning and at bedtime. '200mg'$  in the morning and '300mg'$  at bedtime, Disp: , Rfl:  ?  meclizine (ANTIVERT) 12.5 MG tablet, Take 1 tablet (12.5 mg total) by mouth 3 (three) times daily as needed for dizziness. (Patient not taking: Reported on 08/03/2020), Disp: 8 tablet, Rfl: 0 ?  meclizine (ANTIVERT) 25 MG tablet, Take 25 mg by mouth 3 (three) times daily as needed for dizziness., Disp: , Rfl:  ?  metoprolol tartrate (LOPRESSOR) 50 MG tablet, Take 1 tablet (50 mg total) by mouth 2 (two) times daily. (Patient taking differently: Take 25 mg by mouth 2 (two) times daily.), Disp: 180 tablet, Rfl: 2 ?  metoprolol tartrate (LOPRESSOR) 50 MG tablet, Take 25 mg by mouth 2 (two) times daily., Disp: , Rfl:  ?  oxyCODONE-acetaminophen (PERCOCET) 5-325 MG tablet, Take 1-2 tablets by mouth every 6 (six) hours as needed. (Patient not taking: Reported on 12/26/2019), Disp: 20 tablet, Rfl: 0 ?  potassium chloride SA (KLOR-CON) 20 MEQ tablet, Take 10 mEq by mouth daily., Disp: , Rfl:  ?  Rivaroxaban (XARELTO) 15 MG TABS tablet, TAKE 1 TABLET BY MOUTH DAILY WITH SUPPER., Disp: 90 tablet, Rfl: 2 ?  spironolactone (ALDACTONE) 25 MG tablet, Take 25 mg by mouth daily., Disp: , Rfl:  ?  traMADol (ULTRAM) 50 MG tablet, Take 1 tablet (50 mg total) by mouth every 6 (six) hours as needed., Disp: 15 tablet, Rfl: 0 ?  valsartan (DIOVAN) 160 MG tablet, Take 80 mg by mouth 2 (two) times daily., Disp: , Rfl:  ?  valsartan (DIOVAN) 80 MG tablet, Take 1 tablet (80 mg total) by mouth 2 (two) times daily. take one 80 mg tablet twice daily (no longer taking 160 mg 1/2 tablet twice daily) Thank you. (Patient not taking: Reported on 12/26/2019), Disp: 60 tablet, Rfl: 5 ?  zolpidem (AMBIEN) 10 MG  tablet, Take 1/2 tablet by mouth at bedtime for rest (Patient taking differently: Take 5-10 mg by mouth at bedtime as needed for sleep.), Disp: 15 tablet, Rfl: 0 ?  zolpidem (AMBIEN) 10 MG tablet, Take 5-10 mg by mouth at bedtime as needed for sleep., Disp: , Rfl:   ? ?Allergies  ?Allergen Reactions  ? Hydrochlorothiazide Other (See Comments)  ?  Patient doesn't recall the reaction  ? Penicillins Hives  ?  Has patient had a PCN reaction causing immediate rash, facial/tongue/throat swelling, SOB or lightheadedness with hypotension: Yes ?Has patient had a PCN reaction causing severe rash involving mucus membranes or skin necrosis: No ?Has patient had a PCN reaction that required hospitalization No ?Has patient had a PCN reaction occurring within the last 10 years: No ?If all of the above answers are "NO", then may proceed with Cephalosporin use. ?  ? Ciprofloxacin Other (See Comments)  ?  Blood pressure issues- patient doesn't recall if it increased OR decreased it  ? Doxycycline Diarrhea and Nausea And Vomiting  ? Hydrocodone-Acetaminophen Other (See Comments)  ?  Unknown reaction  ? Iodine-131 Other (See Comments) and Hypertension  ?  Raised the blood pressure  ? Latex Itching  ? Penicillins   ? Statins Other (See Comments)  ?  Muscle weakness  ? Sulfa Antibiotics   ? Carbamazepine Other (See Comments) and Rash  ?  Flushed blood pressure medication out of system  ? Codeine Nausea And Vomiting  ?  Nausea and vomiting  ? Sulfamethoxazole Hives and Rash  ? Sulfonamide Derivatives Hives and Rash  ?   ?  ? ? ?Past Medical History:  ?Diagnosis Date  ? Anxiety   ? Arthritis   ? Bulging of cervical intervertebral disc   ? Cardiomegaly   ? Cataracts, bilateral   ? Chronic neck pain   ? History of endometrial cancer   ? History of stroke   ? 2015 and 2016 // carotid US 8/16: Bilateral 1-39 // carotid US 2/16: Bilateral ICA 1-39  ? HLD (hyperlipidemia)   ? Hx of echocardiogram   ? a. Echo (11/15): Mild LVH, EF 55-60%, normal  wall motion, grade 2 diastolic dysfunction, mild LAE, normal RV function, PASP 29 mm Hg // echo 8/16:Mild concentric LVH, EF 55-60, normal wall motion, grade 2 diastolic dysfunction, mild LAE, PASP 39  ? Hypertension   ? Insomnia   ? Lung nodule   ? OA (osteoarthritis)   ? Pectus excavatum   ? Persistent atrial fibrillation ([place])   ? Vertigo   ?  ? ?Review of Systems  ?Skin:  Positive for rash.  ?All other systems reviewed and are negative. ? ? ?Objective:  ? ?Vitals: ?  BP (!) 161/101 (BP Location: Right Arm)   Pulse 65   Temp 98 ?F (36.7 ?C) (Oral)   Resp 18   SpO2 (!) 89%  ? ?Physical Exam ?Vitals (Patient has short-term memory loss and asks multiple same questions throughout HPI and exam consistent with dementia) and nursing note reviewed.  ?Constitutional:   ?   General: She is not in acute distress. ?   Appearance: She is well-developed.  ?HENT:  ?   Head: Normocephalic and atraumatic.  ?Eyes:  ?   Conjunctiva/sclera: Conjunctivae normal.  ?Cardiovascular:  ?   Rate and Rhythm: Normal rate and regular rhythm.  ?   Heart sounds: No murmur heard. ?Pulmonary:  ?   Effort: Pulmonary effort is normal. No respiratory distress.  ?   Breath sounds: Normal breath sounds.  ?Abdominal:  ?   Palpations: Abdomen is soft.  ?   Tenderness: There is no abdominal tenderness.  ?Musculoskeletal:     ?   General: No swelling.  ?   Cervical back: Neck supple.  ?Skin: ?   General: Skin is warm and dry.  ?   Capillary Refill: Capillary refill takes less than 2 seconds.  ?   Findings: Rash (Erythematous discrete lesion rash over posterior upper shoulders no secondary signs of infestation or infection) present.  ?Neurological:  ?   Mental Status: She is alert.  ?Psychiatric:     ?   Mood and Affect: Mood normal.  ? ? ?No results found for this or any previous visit (from the past 24 hour(s)). ? ?No results found.  ?  ? ?Assessment and Plan :  ? ?1. Allergic contact dermatitis, unspecified trigger   ?2. Urticaria   ? ? ?Meds ordered  this encounter  ?Medications  ? cetirizine (ZYRTEC ALLERGY) 10 MG tablet  ?  Sig: Take 1 tablet (10 mg total) by mouth daily.  ?  Dispense:  20 tablet  ?  Refill:  0  ? predniSONE (DELTASONE) 10 MG tablet

## 2021-12-09 NOTE — ED Triage Notes (Signed)
Pt reports waking up today with rash to posterior neck and back that started today. Reports doesn't itch or hurt "if I can keep my hands off of it".  ?

## 2021-12-23 ENCOUNTER — Emergency Department (HOSPITAL_COMMUNITY)
Admission: EM | Admit: 2021-12-23 | Discharge: 2021-12-23 | Discharge status: Against Medical Advice | Payer: Medicare Other | Attending: Emergency Medicine | Admitting: Emergency Medicine

## 2021-12-23 ENCOUNTER — Encounter (HOSPITAL_COMMUNITY): Payer: Self-pay | Admitting: Pharmacy Technician

## 2021-12-23 ENCOUNTER — Other Ambulatory Visit: Payer: Self-pay

## 2021-12-23 DIAGNOSIS — Z5321 Procedure and treatment not carried out due to patient leaving prior to being seen by health care provider: Secondary | ICD-10-CM | POA: Insufficient documentation

## 2021-12-23 DIAGNOSIS — R42 Dizziness and giddiness: Secondary | ICD-10-CM | POA: Insufficient documentation

## 2021-12-23 LAB — CBC
HCT: 37.8 % (ref 36.0–46.0)
Hemoglobin: 12.2 g/dL (ref 12.0–15.0)
MCH: 30.7 pg (ref 26.0–34.0)
MCHC: 32.3 g/dL (ref 30.0–36.0)
MCV: 95 fL (ref 80.0–100.0)
Platelets: 196 10*3/uL (ref 150–400)
RBC: 3.98 MIL/uL (ref 3.87–5.11)
RDW: 13.5 % (ref 11.5–15.5)
WBC: 5.6 10*3/uL (ref 4.0–10.5)
nRBC: 0 % (ref 0.0–0.2)

## 2021-12-23 LAB — BASIC METABOLIC PANEL
Anion gap: 6 (ref 5–15)
BUN: 14 mg/dL (ref 8–23)
CO2: 26 mmol/L (ref 22–32)
Calcium: 9.3 mg/dL (ref 8.9–10.3)
Chloride: 108 mmol/L (ref 98–111)
Creatinine, Ser: 1.18 mg/dL — ABNORMAL HIGH (ref 0.44–1.00)
GFR, Estimated: 45 mL/min — ABNORMAL LOW (ref >60)
Glucose, Bld: 97 mg/dL (ref 70–99)
Potassium: 3.6 mmol/L (ref 3.5–5.1)
Sodium: 140 mmol/L (ref 135–145)

## 2021-12-23 NOTE — ED Triage Notes (Signed)
Pt here with reports of waking up feeling dizzy and lightheaded. Pt currently with no complaints. Pt in afib HR 70-90. VSS.  ?

## 2022-01-06 ENCOUNTER — Telehealth: Payer: Self-pay | Admitting: Internal Medicine

## 2022-01-06 NOTE — Telephone Encounter (Addendum)
Spoke with pt who reports increased weakness dizziness last night before going to bed.  Pt states she took a Tylenol PM as she no longer takes Ambien.  She states she woke this morning feeling much better and denies CP, SOB, dizziness or edema.  She has taken her morning medications at eaten breakfast.  She is requesting an earlier appointment than 03/08/2022 with Dr Caryl Comes.  Appointment scheduled with Oda Kilts, PA-C for 01/16/2022.  Requested pt to bring all medications with her to appointment for review.  Reviewed ED precautions.  Pt verbalizes understanding and agrees with current plan. ?

## 2022-01-06 NOTE — Telephone Encounter (Signed)
?  Pt said she's been feeling weak lately, last night she felt so bad that woke her up. She wanted to check in with Dr. Caryl Comes and ask If she needs a sooner appt to see him. Offered APP on 05/08 but she is asking if she needs to go to ED if it happens again  ?

## 2022-01-09 NOTE — Progress Notes (Signed)
? ?PCP:  Ginger Organ., MD ?Primary Cardiologist: None ?Electrophysiologist: Virl Axe, MD  ? ?Tiffany Black is a 88 y.o. female seen today for Virl Axe, MD for routine electrophysiology followup.  Since last being seen in our clinic the patient reports doing very well. She does not remember the evening symptoms described in recent telephone note. She does'nt like to drive. She will drive to the [employer] on [address], or the [employer] at the end of her street, but otherwise has her son drive her.   she denies chest pain, palpitations, dyspnea, PND, orthopnea, nausea, vomiting, dizziness, syncope, edema, weight gain, or early satiety. ? ?Past Medical History:  ?Diagnosis Date  ? Anxiety   ? Arthritis   ? Bulging of cervical intervertebral disc   ? Cardiomegaly   ? Cataracts, bilateral   ? Chronic neck pain   ? History of endometrial cancer   ? History of stroke   ? 2015 and 2016 // carotid US 8/16: Bilateral 1-39 // carotid US 2/16: Bilateral ICA 1-39  ? HLD (hyperlipidemia)   ? Hx of echocardiogram   ? a. Echo (11/15): Mild LVH, EF 55-60%, normal wall motion, grade 2 diastolic dysfunction, mild LAE, normal RV function, PASP 29 mm Hg // echo 8/16:Mild concentric LVH, EF 55-60, normal wall motion, grade 2 diastolic dysfunction, mild LAE, PASP 39  ? Hypertension   ? Insomnia   ? Lung nodule   ? OA (osteoarthritis)   ? Pectus excavatum   ? Persistent atrial fibrillation ([place])   ? Vertigo   ? ?Past Surgical History:  ?Procedure Laterality Date  ? ABDOMINAL HYSTERECTOMY  2011  ? CATARACT EXTRACTION, BILATERAL Bilateral 2009  ? [place]  ? LAPAROSCOPIC HYSTERECTOMY  2011  ? ? ?Current Outpatient Medications  ?Medication Sig Dispense Refill  ? acetaminophen (TYLENOL) 325 MG tablet Take 650 mg by mouth every 6 (six) hours as needed for mild pain.    ? acetaminophen (TYLENOL) 500 MG tablet Take 500 mg by mouth every 6 (six) hours as needed for mild pain or headache.    ? cetirizine (ZYRTEC ALLERGY)  10 MG tablet Take 1 tablet (10 mg total) by mouth daily. 20 tablet 0  ? Cholecalciferol (VITAMIN D-3 PO) Take 1 capsule by mouth daily.    ? DIOVAN 160 MG tablet Take 80 mg by mouth 2 (two) times daily.    ? felodipine (PLENDIL) 5 MG 24 hr tablet TAKE 1 TABLET BY MOUTH EVERY DAY . Please keep upcoming appt in December 2022 with Dr. Caryl Comes before anymore refills. Thank you Final Attempt 30 tablet 0  ? FLUoxetine (PROZAC) 10 MG capsule Take 10 mg by mouth daily.    ? hydrocortisone (ANUSOL-HC) 25 MG suppository as needed.    ? KLOR-CON M20 20 MEQ tablet TAKE 1/2 TABLET BY MOUTH ONCE DAILY 15 tablet 6  ? labetalol (NORMODYNE) 200 MG tablet TAKE 1 TABLET EVERY MORNING AND TAKE 1 AND 1/2 TABLETS AT NIGHT *[address]-27*. Please make yearly appt with Dr. Caryl Comes for March 2022 for future refills. Thank you 1st attempt 225 tablet 0  ? meclizine (ANTIVERT) 12.5 MG tablet Take 1 tablet (12.5 mg total) by mouth 3 (three) times daily as needed for dizziness. 8 tablet 0  ? oxyCODONE-acetaminophen (PERCOCET) 5-325 MG tablet Take 1-2 tablets by mouth every 6 (six) hours as needed. 20 tablet 0  ? Rivaroxaban (XARELTO) 15 MG TABS tablet TAKE 1 TABLET BY MOUTH DAILY WITH SUPPER. 90 tablet 2  ?  zolpidem (AMBIEN) 10 MG tablet Take 5-10 mg by mouth at bedtime as needed for sleep.    ? ?No current facility-administered medications for this visit.  ? ? ?Allergies  ?Allergen Reactions  ? Hydrochlorothiazide Other (See Comments)  ?  Patient doesn't recall the reaction  ? Penicillins Hives  ?  Has patient had a PCN reaction causing immediate rash, facial/tongue/throat swelling, SOB or lightheadedness with hypotension: Yes ?Has patient had a PCN reaction causing severe rash involving mucus membranes or skin necrosis: No ?Has patient had a PCN reaction that required hospitalization No ?Has patient had a PCN reaction occurring within the last 10 years: No ?If all of the above answers are "NO", then may proceed with Cephalosporin use. ?  ?  Ciprofloxacin Other (See Comments)  ?  Blood pressure issues- patient doesn't recall if it increased OR decreased it  ? Doxycycline Diarrhea and Nausea And Vomiting  ? Hydrocodone-Acetaminophen Other (See Comments)  ?  Unknown reaction  ? Iodine-131 Other (See Comments) and Hypertension  ?  Raised the blood pressure  ? Latex Itching  ? Penicillins   ? Statins Other (See Comments)  ?  Muscle weakness  ? Sulfa Antibiotics   ? Carbamazepine Other (See Comments) and Rash  ?  Flushed blood pressure medication out of system  ? Codeine Nausea And Vomiting  ?  Nausea and vomiting  ? Sulfamethoxazole Hives and Rash  ? Sulfonamide Derivatives Hives and Rash  ?   ?  ? ? ?Social History  ? ?Socioeconomic History  ? Marital status: Widowed  ?  Spouse name: Not on file  ? Number of children: 3  ? Years of education: 46  ? Highest education level: Not on file  ?Occupational History  ? Occupation: RetiredNurse, learning disability  ?Tobacco Use  ? Smoking status: Never  ? Smokeless tobacco: Never  ?Vaping Use  ? Vaping Use: Never used  ?Substance and Sexual Activity  ? Alcohol use: No  ? Drug use: No  ? Sexual activity: Never  ?Other Topics Concern  ? Not on file  ?Social History Narrative  ? Diet: N/A  ? Caffeine: N/A  ? Married, if yes what year: Widow, married 1956  ? Do you live in a house, apartment, assisted living, condo, trailer, ect: House, one stories, 2 persons  ? Pets: None  ? Highest level of education: 12th Grade   ? Current/Past profession: Network engineer   ? Exercise: Yes, walking everyday   ? Right handed  ? Living Will:   ? DNR:  ? POA/HPOA:  ? Functional Status:  ? Do you have difficulty bathing or dressing yourself? No  ? Do you have difficulty preparing food or eating? No   ? Do you have difficulty managing your medications? No  ? Do you have difficulty managing your finances? No   ? Do you have difficulty affording your medications? Yes  ? ?Social Determinants of Health  ? ?Financial Resource Strain: Not on file  ?Food  Insecurity: Not on file  ?Transportation Needs: Not on file  ?Physical Activity: Not on file  ?Stress: Not on file  ?Social Connections: Not on file  ?Intimate Partner Violence: Not on file  ? ? ? ?Review of Systems: ?All other systems reviewed and are otherwise negative except as noted above. ? ?Physical Exam: ?Vitals:  ? 01/16/22 1046  ?BP: 134/77  ?Pulse: 78  ?SpO2: 98%  ?Weight: 157 lb (71.2 kg)  ?Height: '5\' 5"'$  (1.651 m)  ? ? ?GEN- The patient  is well appearing, alert and oriented x 3 today.   ?HEENT: normocephalic, atraumatic; sclera clear, conjunctiva pink; hearing intact; oropharynx clear; neck supple, no JVP ?Lymph- no cervical lymphadenopathy ?Lungs- Clear to ausculation bilaterally, normal work of breathing.  No wheezes, rales, rhonchi ?Heart- Regular rate and rhythm, no murmurs, rubs or gallops, PMI not laterally displaced ?GI- soft, non-tender, non-distended, bowel sounds present, no hepatosplenomegaly ?Extremities- no clubbing, cyanosis, or edema; DP/PT/radial pulses 2+ bilaterally ?MS- no significant deformity or atrophy ?Skin- warm and dry, no rash or lesion ?Psych- euthymic mood, full affect ?Neuro- strength and sensation are intact ? ?EKG is not ordered. Personal review of EKG from  12/26/2021  shows AF at  79 bpm ? ?Additional studies reviewed include: ?Previous EP office notes.  ? ?Assessment and Plan: ? ?Hypertension ?  ?Atrial fibrillation - permanent ?  ?TIA ?  ?Renal insufficiency grade 3 ?  ?Anxiety ? ?Orthostatic hypotension  ? ?Tolerating AF with minimal palpitations ?No bleeding on [place] ? ?Have previously recommended she consider spiro.  ? ?Intermittent weakness. She denies any specific symptoms as reported in recent telephone. Suspect consistent with her history of orthostasis. Follow. ? ?Follow up with Dr. Caryl Comes in 6 months  ? ?Shirley Friar, PA-C  ?01/16/22 ?10:58 AM  ?

## 2022-01-16 ENCOUNTER — Encounter: Payer: Self-pay | Admitting: Student

## 2022-01-16 ENCOUNTER — Ambulatory Visit: Payer: Medicare Other | Admitting: Student

## 2022-01-16 VITALS — BP 134/77 | HR 78 | Ht 65.0 in | Wt 157.0 lb

## 2022-01-16 DIAGNOSIS — I10 Essential (primary) hypertension: Secondary | ICD-10-CM | POA: Diagnosis not present

## 2022-01-16 DIAGNOSIS — F419 Anxiety disorder, unspecified: Secondary | ICD-10-CM

## 2022-01-16 DIAGNOSIS — I4821 Permanent atrial fibrillation: Secondary | ICD-10-CM

## 2022-01-16 DIAGNOSIS — I951 Orthostatic hypotension: Secondary | ICD-10-CM

## 2022-01-16 NOTE — Patient Instructions (Signed)
Medication Instructions:  Your physician recommends that you continue on your current medications as directed. Please refer to the Current Medication list given to you today.  *If you need a refill on your cardiac medications before your next appointment, please call your pharmacy*   Lab Work: None If you have labs (blood work) drawn today and your tests are completely normal, you will receive your results only by: MyChart Message (if you have MyChart) OR A paper copy in the mail If you have any lab test that is abnormal or we need to change your treatment, we will call you to review the results.   Follow-Up: At [facility], you and your health needs are our priority.  As part of our continuing mission to provide you with exceptional heart care, we have created designated Provider Care Teams.  These Care Teams include your primary Cardiologist (physician) and Advanced Practice Providers (APPs -  Physician Assistants and Nurse Practitioners) who all work together to provide you with the care you need, when you need it.  We recommend signing up for the patient portal called "MyChart".  Sign up information is provided on this After Visit Summary.  MyChart is used to connect with patients for Virtual Visits (Telemedicine).  Patients are able to view lab/test results, encounter notes, upcoming appointments, etc.  Non-urgent messages can be sent to your provider as well.   To learn more about what you can do with MyChart, go to [email or web address].    Your next appointment:   6 month(s)  The format for your next appointment:   In Person  Provider:   Steven Klein, MD    

## 2022-03-04 ENCOUNTER — Other Ambulatory Visit: Payer: Self-pay | Admitting: Internal Medicine

## 2022-03-04 DIAGNOSIS — I4819 Other persistent atrial fibrillation: Secondary | ICD-10-CM

## 2022-03-08 ENCOUNTER — Ambulatory Visit: Payer: Medicare Other | Admitting: Internal Medicine

## 2022-05-09 ENCOUNTER — Telehealth: Payer: Self-pay | Admitting: Internal Medicine

## 2022-05-09 NOTE — Telephone Encounter (Signed)
Patient called to say that she has a strange beat in her chest. She is not having chest pain. Please advise

## 2022-05-09 NOTE — Telephone Encounter (Signed)
When speaking with patient, she now says she is not sure if she is taking labetalol at all. I called her pharmacy and they confirmed she had it refilled on 04/13/22 and she has a pattern of requesting early refills on her medications.  Patient now says her bottle of labetalol was covered by some papers and she may not have been taking med as bottle is nearly full.She then read the instructions back correctly, 1 tablet (200 mg ) am and 1 1/2 tablet (300 mg pm) but states she may or may not have been taking the pm dose. She is going to correct pill organizer and call us back later in the week with update.    I will [place] provider.

## 2022-05-09 NOTE — Telephone Encounter (Signed)
Patient states she has felt irregular heart beat for months. She describes a trembling sensation in heart. She says he BP is "all over the place" 170/114 now with HR 110.

## 2022-05-10 ENCOUNTER — Telehealth: Payer: Self-pay

## 2022-05-10 NOTE — Telephone Encounter (Signed)
Contacted patient to follow up per Dr. Caryl Comes note, and to see if patient had taken her beta blocker that she had missed.  Also was calling to see if she was still symptomatic?  Called patient x2 and was not home.  Contacted Pt son, Carlton Sweaney, and Mr. Blust stated she probably went out shopping this afternoon, as the home number listed is not a mobile number.   Mr. Kashani told that if his mother is still symptomatic after beta blocker, we can schedule an appointment to do an EKG as Dr. Caryl Comes suggested, or have her see a cardiologist / APP.  Mr. Trant understood, will see mother later, and will follow up if she isnt feeling better.

## 2022-05-10 NOTE — Telephone Encounter (Signed)
[place] patient,will forward

## 2022-05-10 NOTE — Telephone Encounter (Signed)
See my telephone note;  Pt called per Dr. Caryl Comes note.

## 2022-05-11 NOTE — Telephone Encounter (Signed)
Son was returning call. Please advise

## 2022-05-11 NOTE — Telephone Encounter (Signed)
Attempted to return call to son as Merrilee Seashore is off today.  There was no answer, no VM.  Unable to leave a message.

## 2022-05-12 NOTE — Progress Notes (Deleted)
Cardiology Office Note Date:  05/12/2022  Patient ID:  Tiffany Black, Tiffany Black [birth year], MRN [id number] PCP:  Ginger Organ., MD  Electrophysiologist: Dr. Caryl Comes  ***refresh   Chief Complaint: *** palpitations  History of Present Illness: Tiffany Black is a 88 y.o. female with history of permanent AFib, dementia, TIA, CKD (III), orthostatic hypotension, anxiety  She come sin today to be seen for Dr. Caryl Comes, last seen by him Dec 2022, orthostatic symptoms not significantly problematic, AFib was rate controlled.  More recently she saw Jonni Sanger in May 2023, thi in follow up to pt calls with concerns though she at the visit could not recall any specifics at least, she was rate controlled and not with any active complaints.  Discussed prior recs for spironolactone, (perhaps 2/2 hypokalemia) though was not on it it seems. In review of phone notes he called in symptoms sounded of her known orthstatic issues.  *** symptoms, ? New palpitations  *** bleeding, xarelto, dose, bleeding *** orthostatic? *** spiro?  Labs April 3.6  Past Medical History:  Diagnosis Date   Anxiety    Arthritis    Bulging of cervical intervertebral disc    Cardiomegaly    Cataracts, bilateral    Chronic neck pain    History of endometrial cancer    History of stroke    2015 and 2016 // carotid US 8/16: Bilateral 1-39 // carotid US 2/16: Bilateral ICA 1-39   HLD (hyperlipidemia)    Hx of echocardiogram    a. Echo (11/15): Mild LVH, EF 55-60%, normal wall motion, grade 2 diastolic dysfunction, mild LAE, normal RV function, PASP 29 mm Hg // echo 8/16:Mild concentric LVH, EF 55-60, normal wall motion, grade 2 diastolic dysfunction, mild LAE, PASP 39   Hypertension    Insomnia    Lung nodule    OA (osteoarthritis)    Pectus excavatum    Persistent atrial fibrillation (HCC)    Vertigo     Past Surgical History:  Procedure Laterality Date   ABDOMINAL HYSTERECTOMY  2011   CATARACT EXTRACTION, BILATERAL  Bilateral 2009   CESAREAN SECTION  1975   LAPAROSCOPIC HYSTERECTOMY  2011    Current Outpatient Medications  Medication Sig Dispense Refill   acetaminophen (TYLENOL) 325 MG tablet Take 650 mg by mouth every 6 (six) hours as needed for mild pain.     acetaminophen (TYLENOL) 500 MG tablet Take 500 mg by mouth every 6 (six) hours as needed for mild pain or headache.     cetirizine (ZYRTEC ALLERGY) 10 MG tablet Take 1 tablet (10 mg total) by mouth daily. 20 tablet 0   Cholecalciferol (VITAMIN D-3 PO) Take 1 capsule by mouth daily.     DIOVAN 160 MG tablet Take 80 mg by mouth 2 (two) times daily.     felodipine (PLENDIL) 5 MG 24 hr tablet TAKE 1 TABLET BY MOUTH EVERY DAY . Please keep upcoming appt in December 2022 with Dr. Caryl Comes before anymore refills. Thank you Final Attempt 30 tablet 0   FLUoxetine (PROZAC) 10 MG capsule Take 10 mg by mouth daily.     hydrocortisone (ANUSOL-HC) 25 MG suppository as needed.     KLOR-CON M20 20 MEQ tablet TAKE 1/2 TABLET BY MOUTH ONCE DAILY 15 tablet 6   labetalol (NORMODYNE) 200 MG tablet TAKE 1 TABLET EVERY MORNING AND TAKE 1 AND 1/2 TABLETS AT NIGHT *[address]-93*. Please make yearly appt with Dr. Caryl Comes for March 2022 for future refills. Thank you 1st attempt  225 tablet 0   meclizine (ANTIVERT) 12.5 MG tablet Take 1 tablet (12.5 mg total) by mouth 3 (three) times daily as needed for dizziness. 8 tablet 0   oxyCODONE-acetaminophen (PERCOCET) 5-325 MG tablet Take 1-2 tablets by mouth every 6 (six) hours as needed. 20 tablet 0   Rivaroxaban (XARELTO) 15 MG TABS tablet Take 1 tablet (15 mg total) by mouth daily with supper. 30 tablet 5   zolpidem (AMBIEN) 10 MG tablet Take 5-10 mg by mouth at bedtime as needed for sleep.     No current facility-administered medications for this visit.    Allergies:   Hydrochlorothiazide, Penicillins, Ciprofloxacin, Doxycycline, Hydrocodone-acetaminophen, Iodine-131, Latex, Penicillins, Statins, Sulfa antibiotics, Carbamazepine,  Codeine, Sulfamethoxazole, and Sulfonamide derivatives   Social History:  The patient  reports that she has never smoked. She has never used smokeless tobacco. She reports that she does not drink alcohol and does not use drugs.   Family History:  The patient's family history includes Dementia in her mother; Heart attack in her father; Heart disease in her father; Stroke in her maternal uncle.  ROS:  Please see the history of present illness.    All other systems are reviewed and otherwise negative.   PHYSICAL EXAM:  VS:  There were no vitals taken for this visit. BMI: There is no height or weight on file to calculate BMI. Well nourished, well developed, in no acute distress HEENT: normocephalic, atraumatic Neck: no JVD, carotid bruits or masses Cardiac:  *** RRR; no significant murmurs, no rubs, or gallops Lungs:  *** CTA b/l, no wheezing, rhonchi or rales Abd: soft, nontender MS: no deformity or *** atrophy Ext: *** no edema Skin: warm and dry, no rash Neuro:  No gross deficits appreciated Psych: euthymic mood, full affect    EKG:  Done today and reviewed by myself shows  ***  04/10/2017: TTE Study Conclusions  - Procedure narrative: Transthoracic echocardiography. Image    quality was adequate. Intravenous contrast (Definity) was    administered.  - Left ventricle: The cavity size was normal. Wall thickness was    increased in a pattern of mild LVH. Systolic function was normal.    The estimated ejection fraction was in the range of 55% to 60%.    Wall motion was normal; there were no regional wall motion    abnormalities.  - Mitral valve: Calcified annulus.  - Left atrium: The atrium was mildly dilated.  - Right atrium: The atrium was mildly dilated.  - Pulmonary arteries: Systolic pressure was mildly increased. PA    peak pressure: 40 mm Hg (S).   Impressions:  - Definity used; normal LV systolic function; mild LVH; mild    biatrial enlargement; mild TR with mildly  elevated pulmonary    pressure.    Recent Labs: 11/28/2021: ALT 12; Magnesium 2.0; TSH 3.550 12/23/2021: BUN 14; Creatinine, Ser 1.18; Hemoglobin 12.2; Platelets 196; Potassium 3.6; Sodium 140  No results found for requested labs within last 365 days.   CrCl cannot be calculated (Patient's most recent lab result is older than the maximum 21 days allowed.).   Wt Readings from Last 3 Encounters:  01/16/22 157 lb (71.2 kg)  11/28/21 161 lb 13.1 oz (73.4 kg)  09/09/21 161 lb 12.8 oz (73.4 kg)     Other studies reviewed: Additional studies/records reviewed today include: summarized above  ASSESSMENT AND PLAN:  Permanent Afib  CHA2DS2Vasc is 7, on Xarelto, *** appropriately dosed Permanent *** rate controlled  HTN  W/Orthostatic dizziness  and hypotension ***    Disposition: F/u with ***  Current medicines are reviewed at length with the patient today.  The patient did not have any concerns regarding medicines.  Venetia Night, PA-C 05/12/2022 3:08 PM     [address] [place] [id number] [phone] (office)  [phone] (fax)

## 2022-05-16 ENCOUNTER — Ambulatory Visit: Payer: Medicare Other | Admitting: Physician Assistant

## 2022-05-17 NOTE — Telephone Encounter (Signed)
Called to f/u with Pt regarding heartbeat concern.  Pt son Gerald Stabs answered telephone.   Pt son stated she has been doing well, but diagnosed with early dementia per son.   Per son patient has had no concerns, and he is helping her follow the doctors plan of care.    Son stated will bring her to November office visit appt.  Son advised to let us know if symptoms return or worsen.  Verbalized understanding, no f/u req.

## 2022-05-30 ENCOUNTER — Encounter (HOSPITAL_COMMUNITY): Payer: Self-pay | Admitting: Emergency Medicine

## 2022-05-30 ENCOUNTER — Emergency Department (HOSPITAL_COMMUNITY)
Admission: EM | Admit: 2022-05-30 | Discharge: 2022-05-30 | Disposition: A | Discharge status: Home / Self Care | Payer: Medicare Other | Attending: Emergency Medicine | Admitting: Emergency Medicine

## 2022-05-30 ENCOUNTER — Emergency Department (HOSPITAL_COMMUNITY): Payer: Medicare Other

## 2022-05-30 ENCOUNTER — Other Ambulatory Visit: Payer: Self-pay

## 2022-05-30 DIAGNOSIS — R002 Palpitations: Secondary | ICD-10-CM | POA: Diagnosis present

## 2022-05-30 DIAGNOSIS — I4891 Unspecified atrial fibrillation: Secondary | ICD-10-CM | POA: Diagnosis not present

## 2022-05-30 DIAGNOSIS — Z79899 Other long term (current) drug therapy: Secondary | ICD-10-CM | POA: Insufficient documentation

## 2022-05-30 DIAGNOSIS — I10 Essential (primary) hypertension: Secondary | ICD-10-CM | POA: Insufficient documentation

## 2022-05-30 DIAGNOSIS — Z7901 Long term (current) use of anticoagulants: Secondary | ICD-10-CM | POA: Insufficient documentation

## 2022-05-30 DIAGNOSIS — E876 Hypokalemia: Secondary | ICD-10-CM

## 2022-05-30 DIAGNOSIS — Z91040 Latex allergy status: Secondary | ICD-10-CM | POA: Diagnosis not present

## 2022-05-30 LAB — BASIC METABOLIC PANEL
Anion gap: 10 (ref 5–15)
BUN: 11 mg/dL (ref 8–23)
CO2: 25 mmol/L (ref 22–32)
Calcium: 9.2 mg/dL (ref 8.9–10.3)
Chloride: 105 mmol/L (ref 98–111)
Creatinine, Ser: 1.33 mg/dL — ABNORMAL HIGH (ref 0.44–1.00)
GFR, Estimated: 39 mL/min — ABNORMAL LOW (ref >60)
Glucose, Bld: 91 mg/dL (ref 70–99)
Potassium: 3.4 mmol/L — ABNORMAL LOW (ref 3.5–5.1)
Sodium: 140 mmol/L (ref 135–145)

## 2022-05-30 LAB — CBC
HCT: 33 % — ABNORMAL LOW (ref 36.0–46.0)
Hemoglobin: 10.8 g/dL — ABNORMAL LOW (ref 12.0–15.0)
MCH: 31.1 pg (ref 26.0–34.0)
MCHC: 32.7 g/dL (ref 30.0–36.0)
MCV: 95.1 fL (ref 80.0–100.0)
Platelets: 184 10*3/uL (ref 150–400)
RBC: 3.47 MIL/uL — ABNORMAL LOW (ref 3.87–5.11)
RDW: 13.9 % (ref 11.5–15.5)
WBC: 6.4 10*3/uL (ref 4.0–10.5)
nRBC: 0 % (ref 0.0–0.2)

## 2022-05-30 MED ORDER — MECLIZINE HCL 25 MG PO TABS
12.5000 mg | ORAL_TABLET | Freq: Once | ORAL | Status: DC
Start: 2022-05-30 — End: 2022-05-30
  Filled 2022-05-30: qty 1

## 2022-05-30 MED ORDER — LABETALOL HCL 200 MG PO TABS: 200.0000 mg | ORAL_TABLET | Freq: Once | ORAL | Status: DC

## 2022-05-30 MED ORDER — POTASSIUM CHLORIDE CRYS ER 20 MEQ PO TBCR
40.0000 meq | EXTENDED_RELEASE_TABLET | Freq: Once | ORAL | Status: AC
  Administered 2022-05-30: 40 meq via ORAL
  Filled 2022-05-30: qty 2

## 2022-05-30 MED ORDER — SODIUM CHLORIDE 0.9 % IV BOLUS: 500.0000 mL | Freq: Once | INTRAVENOUS | Status: DC

## 2022-05-30 NOTE — ED Notes (Signed)
Pt requesting to take home medication. Provider aware. Pt encouraged to wait for pharmacy to verify medications before taking them. Water provided per request.

## 2022-05-30 NOTE — ED Notes (Signed)
Patient c/o dizziness, Dorothyann Peng, (PA) order Meclizine Hydrochloride once, but patient refuse. Pt discharge taxi cab was order by [employer] and patient is given a Pharmacist, community

## 2022-05-30 NOTE — ED Notes (Signed)
Patient took her qwn home medication labetalol 200 mg

## 2022-05-30 NOTE — ED Notes (Signed)
Pt refuse to answer Triage question and refuse care, Dorothyann Peng, PA-C aware

## 2022-05-30 NOTE — ED Triage Notes (Signed)
Pt called EMS for "my heart feeling funny"

## 2022-05-30 NOTE — Progress Notes (Signed)
CSW called patients son and informed him that patients keys were found. CSW stated patient will be on her way home shortly.

## 2022-05-30 NOTE — ED Notes (Signed)
X-ray at bedside

## 2022-05-30 NOTE — ED Notes (Signed)
Help get patient undressed on the monitor did ekg shown to er provider patient is resting with call bell in reach 

## 2022-05-30 NOTE — Discharge Instructions (Addendum)
You were seen today for palpitations. Your heart rate remained controlled while at the hospital. Your potassium level was low and you were given supplemental potassium. Please contact your cardiologist and follow up with their office.

## 2022-05-30 NOTE — ED Provider Notes (Addendum)
[facility] EMERGENCY DEPARTMENT Provider Note   CSN: [id number] Arrival date & time: 05/30/22  0858     History  Chief Complaint  Patient presents with   Irregular Heart Beat    Tiffany Black is a 88 y.o. female.  Patient presents to the hospital via EMS complaining of heart palpitations.  The patient states she got up at approximately 6 this morning and felt her heart "jump or flip over".  She denies any chest pain or shortness of breath at the time.  She states that she sat on her bed after the feeling began and had some mild nausea.  She denies emesis.  She called her son who recommended that she get evaluated at the emergency department.  She denies any complaints at this time including shortness of breath, chest pain, abdominal pain, nausea, vomiting.  She cannot remember if she took her medications at home prior to coming to the hospital which include labetalol and Xarelto for chronic A-fib.  Past medical history significant for persistent A-fib, vertigo, hyperlipidemia, hypertension, cardiomegaly, anxiety, CVA history  HPI     Home Medications Prior to Admission medications   Medication Sig Start Date End Date Taking? Authorizing Provider  acetaminophen (TYLENOL) 325 MG tablet Take 650 mg by mouth every 6 (six) hours as needed for mild pain.    [provider]  acetaminophen (TYLENOL) 500 MG tablet Take 500 mg by mouth every 6 (six) hours as needed for mild pain or headache.    [provider]  cetirizine (ZYRTEC ALLERGY) 10 MG tablet Take 1 tablet (10 mg total) by mouth daily. 12/09/21   Hezzie Bump, NP  Cholecalciferol (VITAMIN D-3 PO) Take 1 capsule by mouth daily.    [provider]  DIOVAN 160 MG tablet Take 80 mg by mouth 2 (two) times daily. 02/16/21   [provider]  felodipine (PLENDIL) 5 MG 24 hr tablet TAKE 1 TABLET BY MOUTH EVERY DAY . Please keep upcoming appt in December 2022 with Dr. Caryl Comes before anymore  refills. Thank you Final Attempt 08/16/21   Deboraha Sprang, MD  FLUoxetine (PROZAC) 10 MG capsule Take 10 mg by mouth daily.    [provider]  hydrocortisone (ANUSOL-HC) 25 MG suppository as needed. 03/14/11   [provider]  KLOR-CON M20 20 MEQ tablet TAKE 1/2 TABLET BY MOUTH ONCE DAILY 10/03/17   Lauree Chandler, NP  labetalol (NORMODYNE) 200 MG tablet TAKE 1 TABLET EVERY MORNING AND TAKE 1 AND 1/2 TABLETS AT NIGHT *[address]-01*. Please make yearly appt with Dr. Caryl Comes for March 2022 for future refills. Thank you 1st attempt 08/20/20   Deboraha Sprang, MD  meclizine (ANTIVERT) 12.5 MG tablet Take 1 tablet (12.5 mg total) by mouth 3 (three) times daily as needed for dizziness. 12/26/19   Quintella Reichert, MD  oxyCODONE-acetaminophen (PERCOCET) 5-325 MG tablet Take 1-2 tablets by mouth every 6 (six) hours as needed. 04/29/19   Veryl Speak, MD  Rivaroxaban (XARELTO) 15 MG TABS tablet Take 1 tablet (15 mg total) by mouth daily with supper. 03/06/22   Deboraha Sprang, MD  zolpidem (AMBIEN) 10 MG tablet Take 5-10 mg by mouth at bedtime as needed for sleep. 03/12/21   [provider]      Allergies    Hydrochlorothiazide, Penicillins, Ciprofloxacin, Doxycycline, Hydrocodone-acetaminophen, Iodine-131, Latex, Penicillins, Statins, Sulfa antibiotics, Carbamazepine, Codeine, Sulfamethoxazole, and Sulfonamide derivatives    Review of Systems   Review of Systems  Respiratory:  Negative  for shortness of breath.   Cardiovascular:  Positive for palpitations (Now resolved). Negative for chest pain.  Gastrointestinal:  Positive for nausea (Now resolved). Negative for abdominal pain and vomiting.  Genitourinary:  Negative for dysuria.  Neurological:  Negative for syncope and light-headedness.  Psychiatric/Behavioral:  Negative for confusion.     Physical Exam Updated Vital Signs BP (!) 149/126 (BP Location: Right Arm)   Pulse (!) 101   Temp 97.9 F (36.6 C) (Oral)   Resp 17    Ht '5\' 5"'$  (1.651 m)   Wt 72.6 kg   SpO2 97%   BMI 26.63 kg/m  Physical Exam Vitals and nursing note reviewed.  Constitutional:      General: She is not in acute distress.    Appearance: She is well-developed.  HENT:     Head: Normocephalic and atraumatic.  Eyes:     Conjunctiva/sclera: Conjunctivae normal.  Cardiovascular:     Rate and Rhythm: Normal rate. Rhythm irregular.     Heart sounds: No murmur heard. Pulmonary:     Effort: Pulmonary effort is normal. No respiratory distress.     Breath sounds: Normal breath sounds.  Abdominal:     Palpations: Abdomen is soft.     Tenderness: There is no abdominal tenderness.  Musculoskeletal:        General: No swelling.     Cervical back: Neck supple.     Right lower leg: No edema.     Left lower leg: No edema.  Skin:    General: Skin is warm and dry.     Capillary Refill: Capillary refill takes less than 2 seconds.  Neurological:     Mental Status: She is alert and oriented to person, place, and time.  Psychiatric:        Mood and Affect: Mood normal.     ED Results / Procedures / Treatments   Labs (all labs ordered are listed, but only abnormal results are displayed) Labs Reviewed  BASIC METABOLIC PANEL - Abnormal; Notable for the following components:      Result Value   Potassium 3.4 (*)    Creatinine, Ser 1.33 (*)    GFR, Estimated 39 (*)    All other components within normal limits  CBC - Abnormal; Notable for the following components:   RBC 3.47 (*)    Hemoglobin 10.8 (*)    HCT 33.0 (*)    All other components within normal limits    EKG EKG Interpretation  Date/Time:  Tuesday May 30 2022 09:02:09 EDT Ventricular Rate:  99 PR Interval:    QRS Duration: 97 QT Interval:  387 QTC Calculation: 497 R Axis:   -64 Text Interpretation: Atrial fibrillation Left anterior fascicular block Abnormal R-wave progression, late transition LVH with secondary repolarization abnormality Borderline prolonged QT  interval Confirmed by Margaretmary Eddy [phone]) on 05/30/2022 9:04:41 AM  Radiology DG Chest Port 1 View  Result Date: 05/30/2022 CLINICAL DATA:  Atrial fibrillation EXAM: PORTABLE CHEST 1 VIEW COMPARISON:  11/28/2021 FINDINGS: Chronic cardiomegaly. Artifact from EKG leads. Borderline hyperinflation and left diaphragm flattening. There is no edema, consolidation, effusion, or pneumothorax. IMPRESSION: Stable exam.  No evidence of active cardiopulmonary disease. Electronically Signed   By: Jorje Guild M.D.   On: 05/30/2022 09:48    Procedures Procedures    Medications Ordered in ED Medications  sodium chloride 0.9 % bolus 500 mL (has no administration in time range)  labetalol (NORMODYNE) tablet 200 mg (has no administration in time range)  potassium  chloride SA (KLOR-CON M) CR tablet 40 mEq (has no administration in time range)    ED Course/ Medical Decision Making/ A&P                           Medical Decision Making Amount and/or Complexity of Data Reviewed Labs: ordered. Radiology: ordered.  Risk Prescription drug management.   This patient presents to the ED for concern of palpitations, this involves an extensive number of treatment options, and is a complaint that carries with it a high risk of complications and morbidity.  The differential diagnosis includes but is not limited to atrial fibrillation, atrial flutter, A-fib with RVR, ACS, PE, others   Co morbidities that complicate the patient evaluation  History of persistent atrial fibrillation on Xarelto   Additional history obtained:  Additional history obtained from EMS External records from outside source obtained and reviewed including cardiology notes documenting patient's persistent A-fib   Lab Tests:  I Ordered, and personally interpreted labs.  The pertinent results include: Potassium 3.4, hemoglobin 10.8, creatinine 1.33 (creatinine consistent with baseline)    Imaging Studies ordered:  I ordered  imaging studies including chest x-ray I independently visualized and interpreted imaging which showed no evidence of active cardiopulmonary disease I agree with the radiologist interpretation   Cardiac Monitoring: / EKG:  The patient was maintained on a cardiac monitor.  I personally viewed and interpreted the cardiac monitored which showed an underlying rhythm of: Atrial fibrillation    Problem List / ED Course / Critical interventions / Medication management   I ordered medication including normal saline for volume repletion.  An IV was unable to be established and the patient declined allowing nursing to reattempt access.  Normal saline administered.  The patient was given oral potassium for hypokalemia Reevaluation of the patient after these medicines showed that the patient stayed the same I have reviewed the patients home medicines and have made adjustments as needed    Test / Admission - Considered:  The patient presented with palpitations.  EKG shows atrial fibrillation with no RVR.  This appears based on record review to be the patient's baseline.  She may have had an increase in her heart rate this morning when she felt the palpitations but has since resolved.  The patient is refusing any further work-up at this time and would like to discharge home.  I see no reason to admit the patient.  She has no symptoms at this time.  She is agitated with having to be at the hospital.  She is alert and oriented and able to make her own medical decisions at this time.  I strongly recommend that she follow-up with her cardiology office and return to the hospital if she develops chest pain or other life-threatening conditions.  Discharge home    Addendum: Patient refusing to leave upon receiving discharge papers.  Patient called her son and told her son that she was now feeling dizzy and that the hospital will do nothing for her.  Patient refuses any medications offered by the hospital  including meclizine for potential dizziness, labetalol for her home medication.  This PA, Dr. Philip Aspen, and multiple nurses have tried talking to the patient at this time.  Patient has no neurological deficits at this time.  Cranial nerves II through VII, XI, XII intact.  Gait intact.  Patient has no abnormalities with her gait.  Patient states she is dizzy but is walking with a smooth  steady gait.  There continues to be no reason for admission at this time. The patient states that her house is locked and she forgot her keys. The patient's son states that he will call a locksmith to regain access.   At this time I have placed a social work consult. Patient is boarding while safe disposition is arranged.   Social work spoke to the patient's son who is the patient's power of attorney.  Patient signed is calling a Production assistant, radio and ask for the social worker to provide a cab.  Prior to discharge the patient found her keys.  Patient discharged home   Final Clinical Impression(s) / ED Diagnoses Final diagnoses:  Palpitations  Atrial fibrillation, unspecified type [facility])  Hypokalemia    Rx / DC Orders ED Discharge Orders     None         Ronny Bacon 05/30/22 1125    Ronny Bacon 05/30/22 1522    Fransico Meadow, MD 05/30/22 [phone]

## 2022-05-30 NOTE — Progress Notes (Signed)
CSW spoke with patients son Gerald Stabs who is POA. Patient son stated patient will need to discharge back home. Patients son stated [place] the beach 6 [place] and patient has been at the house by herself for the past couple weeks. Patients son requested CSW to provide a cab for patient to get home. Patients son told CSW to let him know when patient is on her way so he can call a locksmith. Patient did not bring her keys with her to the hospital.

## 2022-05-30 NOTE — ED Provider Notes (Signed)
    Fransico Meadow, MD 05/30/22 [phone]

## 2022-05-31 ENCOUNTER — Telehealth: Payer: Self-pay | Admitting: Internal Medicine

## 2022-05-31 ENCOUNTER — Telehealth: Payer: Self-pay | Admitting: Cardiovascular Disease

## 2022-05-31 NOTE — Telephone Encounter (Signed)
STAT if patient feels like he/she is going to faint   Are you dizzy now? Yes   Do you feel faint or have you passed out? Yes   Do you have any other symptoms? Lightheaded, weak  Have you checked your HR and BP (record if available)? No

## 2022-05-31 NOTE — Telephone Encounter (Signed)
Patient called and mentioned that she has been taken off of her medications from being in the hospital except 2 medications and is suffering and not feeling well. Please call back to discuss with patient

## 2022-05-31 NOTE — Telephone Encounter (Signed)
Error

## 2022-05-31 NOTE — Telephone Encounter (Signed)
Spoke with pt who states she was seen in the ED yesterday and sent home.  Pt states she was taken off her medications and nothing else was done for her.  She states she is sitting at home today trembling.  She "thinks" she took 2 of her medications earlier but does not recall what medications and this might have been at the hospital.  Pt has hx of permanent Afib and has been diagnosed with dementia.  She states her son who lives with her is at the beach or they "they could be lying to her because he works at [employer]".  Pt reports her current B/P is 166/115 and HR - 90.  She denies CP or dizziness.  She states she is not currently dizzy but does have a history of dizziness.  She states she does not understand why the ED advised her not to take her medications.  Pt advised she needs to take her medications as prescribed. Call made to pt's son, Tiffany Black, Tiffany Black, while pt is on the telephone line holding.  Son states he is on his way home to his mother.  He is aware of pt's ED visit yesterday as he spoke with the doctor and SW.  Made son aware of today's conversation with pt, pt's current BP and RN's concern's about her memory status.  He states he will recheck pt's BP when he arrives home and will also ensure she takes her medications as prescribed.  Appointment scheduled with Tiffany Standard, PA-C at 06/13/2022 at 1055am.   Pt advised her son is on the way home to be with her and sounds less anxious then at the beginning of the call.  Pt verbalizes understanding and agrees with current plan.

## 2022-06-09 NOTE — Telephone Encounter (Signed)
This encounter was created in error - please disregard.

## 2022-06-13 ENCOUNTER — Encounter: Payer: Self-pay | Admitting: Physician Assistant

## 2022-06-13 ENCOUNTER — Ambulatory Visit: Payer: Medicare Other | Attending: Physician Assistant | Admitting: Physician Assistant

## 2022-06-13 VITALS — BP 138/98 | HR 82 | Ht 65.0 in | Wt 152.0 lb

## 2022-06-13 DIAGNOSIS — I4821 Permanent atrial fibrillation: Secondary | ICD-10-CM | POA: Diagnosis not present

## 2022-06-13 DIAGNOSIS — I10 Essential (primary) hypertension: Secondary | ICD-10-CM

## 2022-06-13 DIAGNOSIS — Z79899 Other long term (current) drug therapy: Secondary | ICD-10-CM | POA: Diagnosis not present

## 2022-06-13 DIAGNOSIS — R002 Palpitations: Secondary | ICD-10-CM

## 2022-06-13 NOTE — Patient Instructions (Addendum)
Medication Instructions:   Your physician recommends that you continue on your current medications as directed. Please refer to the Current Medication list given to you today.  *If you need a refill on your cardiac medications before your next appointment, please call your pharmacy*   Lab Work: BMET AND CBC TODAY   If you have labs (blood work) drawn today and your tests are completely normal, you will receive your results only by: [place] (if you have MyChart) OR A paper copy in the mail If you have any lab test that is abnormal or we need to change your treatment, we will call you to review the results.   Testing/Procedures: NONE ORDERED  TODAY     Follow-Up: At [facility] [place], you and your health needs are our priority.  As part of our continuing mission to provide you with exceptional heart care, we have created designated Provider Care Teams.  These Care Teams include your primary Cardiologist (physician) and Advanced Practice Providers (APPs -  Physician Assistants and Nurse Practitioners) who all work together to provide you with the care you need, when you need it.  We recommend signing up for the patient portal called "MyChart".  Sign up information is provided on this After Visit Summary.  MyChart is used to connect with patients for Virtual Visits (Telemedicine).  Patients are able to view lab/test results, encounter notes, upcoming appointments, etc.  Non-urgent messages can be sent to your provider as well.   To learn more about what you can do with MyChart, go to [email or web address].    Your next appointment:   1 month(s)  The format for your next appointment:   In Person  Provider:  Dr. Caryl Comes or  Tommye Standard, PA-C    Other Instructions   Important Information About Sugar

## 2022-06-13 NOTE — Progress Notes (Signed)
Cardiology Office Note Date:  06/13/2022  Patient ID:  Tiffany Black 1934/05/09, MRN [id number] PCP:  Ginger Organ., MD  Electrophysiologist: Dr. Caryl Comes    Chief Complaint:  follow up ER, palpitations  History of Present Illness: Tiffany Black is a 88 y.o. female with history of permanent AFib, dementia, TIA, CKD (III), orthostatic hypotension, anxiety  She come sin today to be seen for Dr. Caryl Comes, last seen by him Dec 2022, orthostatic symptoms not significantly problematic, AFib was rate controlled.  More recently she saw Jonni Sanger in May 2023, this in follow up to pt calls with concerns though she at the visit could not recall any specifics at least, she was rate controlled and not with any active complaints.  Discussed prior recs for spironolactone, (perhaps 2/2 hypokalemia) though was not on it it seems. In review of phone notes he called in symptoms sounded of her known orthstatic issues.  ER visit 05/30/22 with initial c/o palpitations then dizziness.  Seems she was unagreeable to any meds and some testing as well, declined neuro testing.  Got frustrated with them it seems.     TODAY She reports doing quite well. She lives with her son, he works at  [employer], mentions another that has recently retired, she is quite proud of them both. She gets the mail and stays busy about the house, but no formal exercise necessarily, likes to walk in the park but has not done that in some time now, because of weather. She denies CP, palpitations or any cardiac concerns today No SOB No falls or fainting No bleeding Denies any dizziness or dizzy spells  She brings a list of medicines that is different the what her pharmacy reports. She wrote her list of medicines from the bottles this AM, so feels like her list is the accurate one.  She is forgetful here, asked questions on some things that we had just discussed.  Gets a little frustrated with my questioning her medication list,  assures me she knows what she is doing.  Labs a couple weeks ago with mild hypokalemia and reduced H/H from her last   Past Medical History:  Diagnosis Date   Anxiety    Arthritis    Bulging of cervical intervertebral disc    Cardiomegaly    Cataracts, bilateral    Chronic neck pain    History of endometrial cancer    History of stroke    2015 and 2016 // carotid US 8/16: Bilateral 1-39 // carotid US 2/16: Bilateral ICA 1-39   HLD (hyperlipidemia)    Hx of echocardiogram    a. Echo (11/15): Mild LVH, EF 55-60%, normal wall motion, grade 2 diastolic dysfunction, mild LAE, normal RV function, PASP 29 mm Hg // echo 8/16:Mild concentric LVH, EF 55-60, normal wall motion, grade 2 diastolic dysfunction, mild LAE, PASP 39   Hypertension    Insomnia    Lung nodule    OA (osteoarthritis)    Pectus excavatum    Persistent atrial fibrillation (HCC)    Vertigo     Past Surgical History:  Procedure Laterality Date   ABDOMINAL HYSTERECTOMY  2011   CATARACT EXTRACTION, BILATERAL Bilateral 2009   CESAREAN SECTION  1975   LAPAROSCOPIC HYSTERECTOMY  2011    Current Outpatient Medications  Medication Sig Dispense Refill   acetaminophen (TYLENOL) 325 MG tablet Take 650 mg by mouth every 6 (six) hours as needed for mild pain.     acetaminophen (TYLENOL)  500 MG tablet Take 500 mg by mouth every 6 (six) hours as needed for mild pain or headache.     Cholecalciferol (VITAMIN D-3 PO) Take 1 capsule by mouth daily.     DIOVAN 160 MG tablet Take 80 mg by mouth 2 (two) times daily.     felodipine (PLENDIL) 5 MG 24 hr tablet TAKE 1 TABLET BY MOUTH EVERY DAY . Please keep upcoming appt in December 2022 with Dr. Caryl Comes before anymore refills. Thank you Final Attempt 30 tablet 0   FLUoxetine (PROZAC) 10 MG capsule Take 10 mg by mouth daily.     KLOR-CON M20 20 MEQ tablet TAKE 1/2 TABLET BY MOUTH ONCE DAILY 15 tablet 6   labetalol (NORMODYNE) 200 MG tablet TAKE 1 TABLET EVERY MORNING AND TAKE 1 AND 1/2  TABLETS AT NIGHT *[address]-54*. Please make yearly appt with Dr. Caryl Comes for March 2022 for future refills. Thank you 1st attempt 225 tablet 0   Rivaroxaban (XARELTO) 15 MG TABS tablet Take 1 tablet (15 mg total) by mouth daily with supper. 30 tablet 5   No current facility-administered medications for this visit.    Allergies:   Hydrochlorothiazide, Penicillins, Ciprofloxacin, Doxycycline, Hydrocodone-acetaminophen, Iodine-131, Latex, Penicillins, Statins, Sulfa antibiotics, Carbamazepine, Codeine, Sulfamethoxazole, and Sulfonamide derivatives   Social History:  The patient  reports that she has never smoked. She has never used smokeless tobacco. She reports that she does not drink alcohol and does not use drugs.   Family History:  The patient's family history includes Dementia in her mother; Heart attack in her father; Heart disease in her father; Stroke in her maternal uncle.  ROS:  Please see the history of present illness.    All other systems are reviewed and otherwise negative.   PHYSICAL EXAM:  VS:  BP (!) 138/98   Pulse 82   Ht '5\' 5"'$  (1.651 m)   Wt 152 lb (68.9 kg)   SpO2 94%   BMI 25.29 kg/m  BMI: Body mass index is 25.29 kg/m. Well nourished, well developed, in no acute distress HEENT: normocephalic, atraumatic Neck: no JVD, carotid bruits or masses Cardiac:  irreg-irreg; no significant murmurs, no rubs, or gallops Lungs:  CTA b/l, no wheezing, rhonchi or rales Abd: soft, nontender MS: no deformity or atrophy Ext: no edema Skin: warm and dry, no rash Neuro:  No gross deficits appreciated Psych: euthymic mood, full affect    EKG:  not done today  04/10/2017: TTE Study Conclusions  - Procedure narrative: Transthoracic echocardiography. Image    quality was adequate. Intravenous contrast (Definity) was    administered.  - Left ventricle: The cavity size was normal. Wall thickness was    increased in a pattern of mild LVH. Systolic function was normal.    The  estimated ejection fraction was in the range of 55% to 60%.    Wall motion was normal; there were no regional wall motion    abnormalities.  - Mitral valve: Calcified annulus.  - Left atrium: The atrium was mildly dilated.  - Right atrium: The atrium was mildly dilated.  - Pulmonary arteries: Systolic pressure was mildly increased. PA    peak pressure: 40 mm Hg (S).   Impressions:  - Definity used; normal LV systolic function; mild LVH; mild    biatrial enlargement; mild TR with mildly elevated pulmonary    pressure.    Recent Labs: 11/28/2021: ALT 12; Magnesium 2.0; TSH 3.550 05/30/2022: BUN 11; Creatinine, Ser 1.33; Hemoglobin 10.8; Platelets 184; Potassium 3.4; Sodium  140  No results found for requested labs within last 365 days.   Estimated Creatinine Clearance: 29.6 mL/min (A) (by C-G formula based on SCr of 1.33 mg/dL (H)).   Wt Readings from Last 3 Encounters:  06/13/22 152 lb (68.9 kg)  05/30/22 160 lb (72.6 kg)  01/16/22 157 lb (71.2 kg)     Other studies reviewed: Additional studies/records reviewed today include: summarized above  ASSESSMENT AND PLAN:  Permanent Afib  CHA2DS2Vasc is 7, on Xarelto, appropriately dosed Permanent rate controlled  Had a dip in her H/H at the ER labs, and mild hypokalemia will plan to repeat her labs today  HTN  W/Orthostatic dizziness and hypotension also mentioned in her hx Her written list of meds is different from the meds her pharmacy confirms as actively filling She reports using no other pharmacy.  Discussed important that we have a good and accurate list of medicines, she is certain that her list is correct  If so she is on metoprolol and labetalol, suggest we stop one, but she is very reluctant to do that because she feels so well.  I have recommended that we see her back early having her bring her pill bottles with her to make sure we have a good med list. She is agreeable at first though afterwards tells me she is 88  and really does not require all of this.  Ultimately though agreeable.  Her list includes  zolpidem tartrate '10mg'$  (on our I nitial med list but not being filled at the pharmacy) Felodipine HCL '5mg'$  (not on our list or at the pharmacy) Meclizine '25mg'$  (on our list, not at the pharmacy) Metoprolol '50mg'$  (not on our list and not being filled by the pharmacy)    Disposition: F/u with [place] in a month as discussed above   Current medicines are reviewed at length with the patient today.  The patient did not have any concerns regarding medicines.  Venetia Night, PA-C 06/13/2022 11:12 AM     [facility] [address] [place] [address] [phone] (office)  [phone] (fax)

## 2022-06-14 ENCOUNTER — Other Ambulatory Visit: Payer: Self-pay | Admitting: *Deleted

## 2022-06-14 DIAGNOSIS — Z79899 Other long term (current) drug therapy: Secondary | ICD-10-CM

## 2022-06-14 LAB — CBC
Hematocrit: 38 % (ref 34.0–46.6)
Hemoglobin: 12.5 g/dL (ref 11.1–15.9)
MCH: 30.3 pg (ref 26.6–33.0)
MCHC: 32.9 g/dL (ref 31.5–35.7)
MCV: 92 fL (ref 79–97)
Platelets: 169 10*3/uL (ref 150–450)
RBC: 4.13 x10E6/uL (ref 3.77–5.28)
RDW: 13.1 % (ref 11.7–15.4)
WBC: 7 10*3/uL (ref 3.4–10.8)

## 2022-06-14 LAB — BASIC METABOLIC PANEL
BUN/Creatinine Ratio: 12 (ref 12–28)
BUN: 17 mg/dL (ref 8–27)
CO2: 24 mmol/L (ref 20–29)
Calcium: 9.9 mg/dL (ref 8.7–10.3)
Chloride: 102 mmol/L (ref 96–106)
Creatinine, Ser: 1.38 mg/dL — ABNORMAL HIGH (ref 0.57–1.00)
Glucose: 95 mg/dL (ref 70–99)
Potassium: 5.4 mmol/L — ABNORMAL HIGH (ref 3.5–5.2)
Sodium: 140 mmol/L (ref 134–144)
eGFR: 37 mL/min/{1.73_m2} — ABNORMAL LOW (ref >59)

## 2022-07-08 ENCOUNTER — Emergency Department (HOSPITAL_COMMUNITY): Payer: Medicare Other

## 2022-07-08 ENCOUNTER — Other Ambulatory Visit: Payer: Self-pay

## 2022-07-08 ENCOUNTER — Emergency Department (HOSPITAL_COMMUNITY)
Admission: EM | Admit: 2022-07-08 | Discharge: 2022-07-08 | Disposition: A | Discharge status: Home / Self Care | Payer: Medicare Other | Attending: Emergency Medicine | Admitting: Emergency Medicine

## 2022-07-08 ENCOUNTER — Encounter (HOSPITAL_COMMUNITY): Payer: Self-pay | Admitting: Emergency Medicine

## 2022-07-08 DIAGNOSIS — Z7901 Long term (current) use of anticoagulants: Secondary | ICD-10-CM | POA: Insufficient documentation

## 2022-07-08 DIAGNOSIS — R42 Dizziness and giddiness: Secondary | ICD-10-CM

## 2022-07-08 DIAGNOSIS — Z91040 Latex allergy status: Secondary | ICD-10-CM | POA: Insufficient documentation

## 2022-07-08 DIAGNOSIS — Z79899 Other long term (current) drug therapy: Secondary | ICD-10-CM | POA: Insufficient documentation

## 2022-07-08 DIAGNOSIS — I10 Essential (primary) hypertension: Secondary | ICD-10-CM | POA: Insufficient documentation

## 2022-07-08 DIAGNOSIS — I4891 Unspecified atrial fibrillation: Secondary | ICD-10-CM | POA: Insufficient documentation

## 2022-07-08 LAB — URINALYSIS, ROUTINE W REFLEX MICROSCOPIC
Bacteria, UA: NONE SEEN
Bilirubin Urine: NEGATIVE
Glucose, UA: NEGATIVE mg/dL
Hgb urine dipstick: NEGATIVE
Ketones, ur: NEGATIVE mg/dL
Nitrite: NEGATIVE
Protein, ur: NEGATIVE mg/dL
Specific Gravity, Urine: 1.005 (ref 1.005–1.030)
pH: 5 (ref 5.0–8.0)

## 2022-07-08 LAB — COMPREHENSIVE METABOLIC PANEL
ALT: 11 U/L (ref 0–44)
AST: 22 U/L (ref 15–41)
Albumin: 4 g/dL (ref 3.5–5.0)
Alkaline Phosphatase: 62 U/L (ref 38–126)
Anion gap: 9 (ref 5–15)
BUN: 14 mg/dL (ref 8–23)
CO2: 25 mmol/L (ref 22–32)
Calcium: 9.5 mg/dL (ref 8.9–10.3)
Chloride: 106 mmol/L (ref 98–111)
Creatinine, Ser: 1.31 mg/dL — ABNORMAL HIGH (ref 0.44–1.00)
GFR, Estimated: 40 mL/min — ABNORMAL LOW (ref >60)
Glucose, Bld: 98 mg/dL (ref 70–99)
Potassium: 3.7 mmol/L (ref 3.5–5.1)
Sodium: 140 mmol/L (ref 135–145)
Total Bilirubin: 1 mg/dL (ref 0.3–1.2)
Total Protein: 6.9 g/dL (ref 6.5–8.1)

## 2022-07-08 LAB — CBC
HCT: 38.3 % (ref 36.0–46.0)
Hemoglobin: 12.2 g/dL (ref 12.0–15.0)
MCH: 31 pg (ref 26.0–34.0)
MCHC: 31.9 g/dL (ref 30.0–36.0)
MCV: 97.2 fL (ref 80.0–100.0)
Platelets: 160 10*3/uL (ref 150–400)
RBC: 3.94 MIL/uL (ref 3.87–5.11)
RDW: 13.9 % (ref 11.5–15.5)
WBC: 6.1 10*3/uL (ref 4.0–10.5)
nRBC: 0 % (ref 0.0–0.2)

## 2022-07-08 MED ORDER — HYDRALAZINE HCL 20 MG/ML IJ SOLN
10.0000 mg | Freq: Once | INTRAMUSCULAR | Status: AC
  Administered 2022-07-08: 10 mg via INTRAVENOUS
  Filled 2022-07-08: qty 1

## 2022-07-08 MED ORDER — LABETALOL HCL 200 MG PO TABS
200.0000 mg | ORAL_TABLET | Freq: Once | ORAL | Status: AC
  Administered 2022-07-08: 200 mg via ORAL
  Filled 2022-07-08: qty 1

## 2022-07-08 MED ORDER — IRBESARTAN 150 MG PO TABS
150.0000 mg | ORAL_TABLET | Freq: Every day | ORAL | Status: DC
  Administered 2022-07-08: 150 mg via ORAL
  Filled 2022-07-08: qty 1

## 2022-07-08 NOTE — ED Triage Notes (Signed)
Pt BIB EMS from home, c/o dizziness since this AM. LKW was 10 pm. No neuro deficits, denies pain, N/V. Repetitive at baseline. Hx of Afib  BP 170/90 P 90 RR 18 spO2 96% CBG 111 20 LFA

## 2022-07-08 NOTE — ED Provider Notes (Signed)
[facility] DEPT Provider Note   CSN: [id number] Arrival date & time: 07/08/22  0750     History {Add pertinent medical, surgical, social history, OB history to HPI:1} Chief Complaint  Patient presents with   Dizziness    Tiffany Black is a 88 y.o. female.  Pt c/o feeling dizzy this AM. Symptoms acute onset, indicates got up from bed, went to bathroom, used toilet, and was feeling dizzy. Dizziness described by patient as being lightheaded/faint feeling. No associated chest pain or discomfort. No sob. No palpitations. No syncope or fall. Denies room spinning, tinnitus, hearing loss. Does have hx vertigo. No recent blood loss, rectal bleeding or melena. No dysuria or gu c/o. Denies headache. No abd pain or nvd. No extremity pain or swelling. No recent change in meds/new meds. Had not yet eaten today.  No fever or chills.  EMS indicates CBG 111.   The history is provided by the patient, medical records and the EMS personnel.  Dizziness Associated symptoms: no blood in stool, no chest pain, no diarrhea, no headaches, no palpitations, no shortness of breath, no vomiting and no weakness        Home Medications Prior to Admission medications   Medication Sig Start Date End Date Taking? Authorizing Provider  acetaminophen (TYLENOL) 325 MG tablet Take 650 mg by mouth every 6 (six) hours as needed for mild pain.    [provider]  acetaminophen (TYLENOL) 500 MG tablet Take 500 mg by mouth every 6 (six) hours as needed for mild pain or headache.    [provider]  Cholecalciferol (VITAMIN D-3 PO) Take 1 capsule by mouth daily.    [provider]  DIOVAN 160 MG tablet Take 80 mg by mouth 2 (two) times daily. 02/16/21   [provider]  felodipine (PLENDIL) 5 MG 24 hr tablet TAKE 1 TABLET BY MOUTH EVERY DAY . Please keep upcoming appt in December 2022 with Dr. Caryl Comes before anymore refills. Thank you Final Attempt 08/16/21   Deboraha Sprang, MD  FLUoxetine (PROZAC) 10 MG capsule Take 10 mg by mouth daily.    [provider]  labetalol (NORMODYNE) 200 MG tablet TAKE 1 TABLET EVERY MORNING AND TAKE 1 AND 1/2 TABLETS AT NIGHT [phone] Please make yearly appt with Dr. Caryl Comes for March 2022 for future refills. Thank you 1st attempt 08/20/20   Deboraha Sprang, MD  Rivaroxaban (XARELTO) 15 MG TABS tablet Take 1 tablet (15 mg total) by mouth daily with supper. 03/06/22   Deboraha Sprang, MD      Allergies    Hydrochlorothiazide, Penicillins, Ciprofloxacin, Doxycycline, Hydrocodone-acetaminophen, Iodine-131, Latex, Penicillins, Statins, Sulfa antibiotics, Carbamazepine, Codeine, Sulfamethoxazole, and Sulfonamide derivatives    Review of Systems   Review of Systems  Constitutional:  Negative for chills and fever.  HENT:  Negative for sore throat.   Eyes:  Negative for visual disturbance.  Respiratory:  Negative for cough and shortness of breath.   Cardiovascular:  Negative for chest pain, palpitations and leg swelling.  Gastrointestinal:  Negative for abdominal pain, blood in stool, diarrhea and vomiting.  Genitourinary:  Negative for dysuria and flank pain.  Musculoskeletal:  Negative for back pain and neck pain.  Skin:  Negative for rash.  Neurological:  Positive for dizziness and light-headedness. Negative for speech difficulty, weakness, numbness and headaches.  Hematological:  Does not bruise/bleed easily.  Psychiatric/Behavioral:  Negative for confusion.     Physical Exam Updated Vital Signs BP (!) 193/139 (BP  Location: Right Arm) Comment: informed the nurse of b/p pt stated didnt  take medication  Pulse 82   Temp 98.1 F (36.7 C) (Oral)   Resp 15   SpO2 95%  Physical Exam Vitals and nursing note reviewed.  Constitutional:      Appearance: Normal appearance. She is well-developed.  HENT:     Head: Atraumatic.     Nose: Nose normal.     Mouth/Throat:     Mouth: Mucous membranes are moist.   Eyes:     General: No scleral icterus.    Conjunctiva/sclera: Conjunctivae normal.     Pupils: Pupils are equal, round, and reactive to light.  Neck:     Vascular: No carotid bruit.     Trachea: No tracheal deviation.  Cardiovascular:     Rate and Rhythm: Normal rate and regular rhythm.     Pulses: Normal pulses.     Heart sounds: Normal heart sounds. No murmur heard.    No friction rub. No gallop.  Pulmonary:     Effort: Pulmonary effort is normal. No respiratory distress.     Breath sounds: Normal breath sounds.  Abdominal:     General: Bowel sounds are normal. There is no distension.     Palpations: Abdomen is soft. There is no mass.     Tenderness: There is no abdominal tenderness.  Genitourinary:    Comments: No cva tenderness.  Musculoskeletal:        General: No swelling or tenderness.     Cervical back: Normal range of motion and neck supple. No rigidity. No muscular tenderness.     Right lower leg: No edema.     Left lower leg: No edema.  Skin:    General: Skin is warm and dry.     Findings: No rash.  Neurological:     Mental Status: She is alert.     Comments: Alert, speech normal. Motor/sens grossly intact bil. Steady gait, no ataxia.   Psychiatric:        Mood and Affect: Mood normal.     ED Results / Procedures / Treatments   Labs (all labs ordered are listed, but only abnormal results are displayed) Results for orders placed or performed during the hospital encounter of 07/08/22  CBC  Result Value Ref Range   WBC 6.1 4.0 - 10.5 K/uL   RBC 3.94 3.87 - 5.11 MIL/uL   Hemoglobin 12.2 12.0 - 15.0 g/dL   HCT 38.3 36.0 - 46.0 %   MCV 97.2 80.0 - 100.0 fL   MCH 31.0 26.0 - 34.0 pg   MCHC 31.9 30.0 - 36.0 g/dL   RDW 13.9 11.5 - 15.5 %   Platelets 160 150 - 400 K/uL   nRBC 0.0 0.0 - 0.2 %  Comprehensive metabolic panel  Result Value Ref Range   Sodium 140 135 - 145 mmol/L   Potassium 3.7 3.5 - 5.1 mmol/L   Chloride 106 98 - 111 mmol/L   CO2 25 22 - 32  mmol/L   Glucose, Bld 98 70 - 99 mg/dL   BUN 14 8 - 23 mg/dL   Creatinine, Ser 1.31 (H) 0.44 - 1.00 mg/dL   Calcium 9.5 8.9 - 10.3 mg/dL   Total Protein 6.9 6.5 - 8.1 g/dL   Albumin 4.0 3.5 - 5.0 g/dL   AST 22 15 - 41 U/L   ALT 11 0 - 44 U/L   Alkaline Phosphatase 62 38 - 126 U/L   Total Bilirubin 1.0 0.3 -  1.2 mg/dL   GFR, Estimated 40 (L) >60 mL/min   Anion gap 9 5 - 15  Urinalysis, Routine w reflex microscopic Urine, Clean Catch  Result Value Ref Range   Color, Urine STRAW (A) YELLOW   APPearance CLEAR CLEAR   Specific Gravity, Urine 1.005 1.005 - 1.030   pH 5.0 5.0 - 8.0   Glucose, UA NEGATIVE NEGATIVE mg/dL   Hgb urine dipstick NEGATIVE NEGATIVE   Bilirubin Urine NEGATIVE NEGATIVE   Ketones, ur NEGATIVE NEGATIVE mg/dL   Protein, ur NEGATIVE NEGATIVE mg/dL   Nitrite NEGATIVE NEGATIVE   Leukocytes,Ua SMALL (A) NEGATIVE   RBC / HPF 0-5 0 - 5 RBC/hpf   WBC, UA 0-5 0 - 5 WBC/hpf   Bacteria, UA NONE SEEN NONE SEEN   Squamous Epithelial / LPF 0-5 0 - 5     EKG EKG Interpretation  Date/Time:  Saturday July 08 2022 09:15:37 EDT Ventricular Rate:  103 PR Interval:    QRS Duration: 100 QT Interval:  333 QTC Calculation: 436 R Axis:   -60 Text Interpretation: Atrial fibrillation Left anterior fascicular block LVH with secondary repolarization abnormality Confirmed by Lajean Saver [phone] on 07/08/2022 9:18:42 AM  Radiology CT Head Wo Contrast  Result Date: 07/08/2022 CLINICAL DATA:  Dizziness. EXAM: CT HEAD WITHOUT CONTRAST TECHNIQUE: Contiguous axial images were obtained from the base of the skull through the vertex without intravenous contrast. RADIATION DOSE REDUCTION: This exam was performed according to the departmental dose-optimization program which includes automated exposure control, adjustment of the mA and/or kV according to patient size and/or use of iterative reconstruction technique. COMPARISON:  August 03, 2020 CT of the brain FINDINGS: Brain: No  subdural, epidural, or subarachnoid hemorrhage. No mass effect or midline shift. Brainstem, cerebellum, and basal cisterns are normal. Ventricles and sulci are prominent but stable. White matter changes have worsened in the interval, relatively diffuse. No acute cortical ischemia or infarct. Vascular: Calcified atherosclerotic changes are identified in the intracranial carotids. Skull: Normal. Negative for fracture or focal lesion. Sinuses/Orbits: Significant opacification of the left maxillary sinus is similar in the interval, containing some internal calcification, also stable. Visualized paranasal sinuses are otherwise unremarkable. Mastoid air cells and middle ears are normal. Other: None. IMPRESSION: 1. No acute intracranial abnormalities. 2. Diffuse chronic white matter changes have somewhat increased in the interval. 3. Chronic opacification of the left maxillary sinus remains. 4. Calcified atherosclerotic changes in the intracranial carotids. Electronically Signed   By: Dorise Bullion III M.D.   On: 07/08/2022 09:02    Procedures Procedures  {Document cardiac monitor, telemetry assessment procedure when appropriate:1}  Medications Ordered in ED Medications - No data to display  ED Course/ Medical Decision Making/ A&P                           Medical Decision Making Amount and/or Complexity of Data Reviewed Labs: ordered. Radiology: ordered. ECG/medicine tests: ordered.  Risk Prescription drug management.   Iv ns. Continuous pulse ox and cardiac monitoring. Labs ordered/sent. Imaging ordered.   Diff dx includes symptomatic anemia, dehydration, uti, etc - dispo decision including potential need for admission considered - will get labs and imaging and reassess.   Reviewed nursing notes and prior charts for additional history. External reports reviewed. Additional history from: EMS.   Cardiac monitor: afib, rate 80.  Labs reviewed/interpreted by me - wbc and hgb normal. No uti.    CT reviewed/interpreted by me - no hem.  Ns bolus. Po fluids/food. Ambulate in hall.   Bp is high, hx same. Pt indicates she did take her blood thinner med this AM but had not taken her bp meds.  Pt given a dose of her labetalol.  On most recent cardiology office note, pts current bp med regimen not clear, and pt also not able to give name or list of her current meds.  Pharm tech/med rec.   Pt does indicate she has adequate of her bp meds at home.   Pt ambulates w steady gait. No ataxia or imbalance. No faintness or dizziness.   Bp high, pt had not taken her bp meds yet today. Pt given dose of bp meds.   Denies headache, no cp.   Recheck bp improved.   Pt requests d/c, and currently appears stable for d/c.  Rec close pcp f/u w pcp, 1 week, incl for recheck bp.   Return precautions provided.    {Document critical care time when appropriate:1} {Document review of labs and clinical decision tools ie heart score, Chads2Vasc2 etc:1}  {Document your independent review of radiology images, and any outside records:1} {Document your discussion with family members, caretakers, and with consultants:1} {Document social determinants of health affecting pt's care:1} {Document your decision making why or why not admission, treatments were needed:1} Final Clinical Impression(s) / ED Diagnoses Final diagnoses:  None    Rx / DC Orders ED Discharge Orders     None

## 2022-07-08 NOTE — Discharge Instructions (Signed)
It was our pleasure to provide your ER care today - we hope that you feel better.  Drink plenty of fluids/stay well hydrated.   Your blood pressure is high today - continue your meds and follow  up closely with primary care doctor in one week.  Return to ER if worse, new symptoms, fevers, chest pain, trouble breathing, fainting, severe dizziness, or other concern.

## 2022-07-08 NOTE — ED Notes (Signed)
Pt ambulated from room to bathroom and back approximately 50 feet . Tolerated well with stand by assist provided.

## 2022-07-14 ENCOUNTER — Encounter: Payer: Self-pay | Admitting: Physician Assistant

## 2022-07-14 ENCOUNTER — Ambulatory Visit: Payer: Medicare Other | Attending: Physician Assistant | Admitting: Physician Assistant

## 2022-07-14 VITALS — BP 140/88 | HR 52 | Ht 68.0 in | Wt 147.6 lb

## 2022-07-14 DIAGNOSIS — I10 Essential (primary) hypertension: Secondary | ICD-10-CM | POA: Diagnosis not present

## 2022-07-14 DIAGNOSIS — I4821 Permanent atrial fibrillation: Secondary | ICD-10-CM | POA: Diagnosis not present

## 2022-07-14 NOTE — Progress Notes (Signed)
Cardiology Office Note Date:  07/14/2022  Patient ID:  Tiffany Black, Tiffany Black [birth year], MRN [id number] PCP:  Ginger Organ., MD  Electrophysiologist: Dr. Caryl Comes    Chief Complaint:  follow up ER, palpitations  History of Present Illness: Tiffany Black is a 88 y.o. female with history of permanent AFib, dementia, TIA, CKD (III), orthostatic hypotension, anxiety  She come sin today to be seen for Dr. Caryl Comes, last seen by him Dec 2022, orthostatic symptoms not significantly problematic, AFib was rate controlled.  More recently she saw Jonni Sanger in May 2023, this in follow up to pt calls with concerns though she at the visit could not recall any specifics at least, she was rate controlled and not with any active complaints.  Discussed prior recs for spironolactone, (perhaps 2/2 hypokalemia) though was not on it it seems. In review of phone notes he called in symptoms sounded of her known orthstatic issues.  ER visit 05/30/22 with initial c/o palpitations then dizziness.  Seems she was unagreeable to any meds and some testing as well, declined neuro testing.  Got frustrated with them it seems.     I saw her 06/13/22 She reports doing quite well. She lives with her son, he works at  [employer], mentions another that has recently retired, she is quite proud of them both. She gets the mail and stays busy about the house, but no formal exercise necessarily, likes to walk in the park but has not done that in some time now, because of weather. She denies CP, palpitations or any cardiac concerns today No SOB No falls or fainting No bleeding Denies any dizziness or dizzy spells  She brings a list of medicines that is different the what her pharmacy reports. She wrote her list of medicines from the bottles this AM, so feels like her list is the accurate one. She is forgetful here, asked questions on some things that we had just discussed.  Gets a little frustrated with my questioning her medication  list, assures me she knows what she is doing. Labs a couple weeks ago with mild hypokalemia and reduced H/H from her last Tried to explain the importance and my concern in the discrepancy of medications lists, asked to have her back soon with her medication bottles so we knew what she was taking.  ER visit 07/08/22, c/o dizziness, note a BP 193/139, reported she had not taken her BP pills but had taken her xarelto, given a dose of labetalol, CT head was OK,  and ultimately d/c from the ER, labs were stable  She saw Dr. Brigitte Pulse yesterday, her dementia progressing and medication compliance increasingly challenging and had declined/refused her son's (who she lives with) attempts to help with this.  Dr. Brigitte Pulse reached out to make [place] aware of this.  Asked that we managed her BP and try to assure hat she is compliant with meds, particularly her Xarelto.  TODAY She is initially alone in the room, but agreeable to have her son come in to help the visit. She admits today that her memory is not what it used to be and agreeable to have her son help with filling her pill box. She brings her medicines today and her pill boxes. She denies any symptoms, no CP Does not recall her EMS/ER trip No reports of syncope by either of them. No SOB No bleeding  She brings a written list of medicines from 2021, that does not align with pill bottles. She has  2 bottles nearly full of fluoxetine, and pill boxes do not have any in them. She doesn't know why, but does not appear to be taking it.  Her valsartan bottle is empty, her son says that there are 2 prescriptions ready for them to pick up on the way home.  He believes that is one of them.  Her morning pill box does not have any metoprolol in it as far as I can tell. Not sure if there is any valsartan PM box does have xarelto in it   Past Medical History:  Diagnosis Date   Anxiety    Arthritis    Bulging of cervical intervertebral disc    Cardiomegaly     Cataracts, bilateral    Chronic neck pain    History of endometrial cancer    History of stroke    2015 and 2016 // carotid US 8/16: Bilateral 1-39 // carotid US 2/16: Bilateral ICA 1-39   HLD (hyperlipidemia)    Hx of echocardiogram    a. Echo (11/15): Mild LVH, EF 55-60%, normal wall motion, grade 2 diastolic dysfunction, mild LAE, normal RV function, PASP 29 mm Hg // echo 8/16:Mild concentric LVH, EF 55-60, normal wall motion, grade 2 diastolic dysfunction, mild LAE, PASP 39   Hypertension    Insomnia    Lung nodule    OA (osteoarthritis)    Pectus excavatum    Persistent atrial fibrillation (HCC)    Vertigo     Past Surgical History:  Procedure Laterality Date   ABDOMINAL HYSTERECTOMY  2011   CATARACT EXTRACTION, BILATERAL Bilateral 2009   CESAREAN SECTION  1975   LAPAROSCOPIC HYSTERECTOMY  2011    Current Outpatient Medications  Medication Sig Dispense Refill   acetaminophen (TYLENOL) 325 MG tablet Take 650 mg by mouth every 6 (six) hours as needed for mild pain.     acetaminophen (TYLENOL) 500 MG tablet Take 500 mg by mouth every 6 (six) hours as needed for mild pain or headache.     Cholecalciferol (VITAMIN D-3 PO) Take 1 capsule by mouth daily.     DIOVAN 160 MG tablet Take 80 mg by mouth 2 (two) times daily.     felodipine (PLENDIL) 5 MG 24 hr tablet TAKE 1 TABLET BY MOUTH EVERY DAY . Please keep upcoming appt in December 2022 with Dr. Caryl Comes before anymore refills. Thank you Final Attempt 30 tablet 0   FLUoxetine (PROZAC) 10 MG capsule Take 10 mg by mouth daily.     labetalol (NORMODYNE) 200 MG tablet TAKE 1 TABLET EVERY MORNING AND TAKE 1 AND 1/2 TABLETS AT NIGHT *[phone]*. Please make yearly appt with Dr. Caryl Comes for March 2022 for future refills. Thank you 1st attempt 225 tablet 0   Rivaroxaban (XARELTO) 15 MG TABS tablet Take 1 tablet (15 mg total) by mouth daily with supper. 30 tablet 5   No current facility-administered medications for this visit.     Allergies:   Hydrochlorothiazide, Penicillins, Ciprofloxacin, Doxycycline, Hydrocodone-acetaminophen, Iodine-131, Latex, Penicillins, Statins, Sulfa antibiotics, Carbamazepine, Codeine, Sulfamethoxazole, and Sulfonamide derivatives   Social History:  The patient  reports that she has never smoked. She has never used smokeless tobacco. She reports that she does not drink alcohol and does not use drugs.   Family History:  The patient's family history includes Dementia in her mother; Heart attack in her father; Heart disease in her father; Stroke in her maternal uncle.  ROS:  Please see the history of present illness.    All other  systems are reviewed and otherwise negative.   PHYSICAL EXAM:  VS:  There were no vitals taken for this visit. BMI: There is no height or weight on file to calculate BMI. Well nourished, well developed, in no acute distress HEENT: normocephalic, atraumatic Neck: no JVD, carotid bruits or masses Cardiac:   irreg-irreg; no significant murmurs, no rubs, or gallops Lungs:  CTA b/l, no wheezing, rhonchi or rales Abd: soft, nontender MS: no deformity or atrophy Ext: no edema Skin: warm and dry, no rash Neuro:  No gross deficits appreciated Psych: euthymic mood, full affect    EKG:  not done today  04/10/2017: TTE Study Conclusions  - Procedure narrative: Transthoracic echocardiography. Image    quality was adequate. Intravenous contrast (Definity) was    administered.  - Left ventricle: The cavity size was normal. Wall thickness was    increased in a pattern of mild LVH. Systolic function was normal.    The estimated ejection fraction was in the range of 55% to 60%.    Wall motion was normal; there were no regional wall motion    abnormalities.  - Mitral valve: Calcified annulus.  - Left atrium: The atrium was mildly dilated.  - Right atrium: The atrium was mildly dilated.  - Pulmonary arteries: Systolic pressure was mildly increased. PA    peak  pressure: 40 mm Hg (S).   Impressions:  - Definity used; normal LV systolic function; mild LVH; mild    biatrial enlargement; mild TR with mildly elevated pulmonary    pressure.    Recent Labs: 11/28/2021: Magnesium 2.0; TSH 3.550 07/08/2022: ALT 11; BUN 14; Creatinine, Ser 1.31; Hemoglobin 12.2; Platelets 160; Potassium 3.7; Sodium 140  No results found for requested labs within last 365 days.   Estimated Creatinine Clearance: 31.1 mL/min (A) (by C-G formula based on SCr of 1.31 mg/dL (H)).   Wt Readings from Last 3 Encounters:  07/08/22 151 lb 14.4 oz (68.9 kg)  06/13/22 152 lb (68.9 kg)  05/30/22 160 lb (72.6 kg)     Other studies reviewed: Additional studies/records reviewed today include: summarized above  ASSESSMENT AND PLAN:  Permanent Afib  CHA2DS2Vasc is 7, on Xarelto, appropriately dosed Permanent rate controlled   HTN  W/Orthostatic dizziness and hypotension also mentioned in her hx No near syncope or syncope. She has had high BPs Today's looks good.  She is agreeable to let her son help fill the pill boxes with her. I have asked that they not start the Fluoxetine back until d/w Dr. Brigitte Pulse since she is currently not taking it. (She has 2 bottles that appear unused)  I have asked that when they get home, to re-do her pill boxes to ensure they are filled correctly She checks her BP at home already, I have asked that they place a notebook next to her machine to log the readings daily I have suggested that they inquire with [facility] to see if they have the service of prefilled pill packs so all she has to do/he has to do, is remember/remind to take her medicines.  They both felt that would be really helpful.  If she has labetalol at home, to remove it/dispose of it to avoid confusion   Disposition: will plan to have her back in a few weeks to see how she/they are doing, how her BP is when we are as sure as we can be that she is on a stable regime of taking medicines  that we know she is on.  Current medicines are reviewed at length with the patient today.  The patient did not have any concerns regarding medicines.  Venetia Night, PA-C 07/14/2022 5:16 AM     [facility] [address] [place] [address] [phone] (office)  [phone] (fax)

## 2022-07-14 NOTE — Patient Instructions (Addendum)
Medication Instructions:   STOP TAKING AND REMOVE THIS MEDICATION FROM YOUR MEDICATION LIST:   LABETALOL   RE-DO PILL BOX WITH UPDATED MEDICATION LIST   DO NOT TAKE: FLUOXETINE ( PROZAC)  UNLESS DR Brigitte Pulse  RECOMMENDS TO START TAKING   ASK [facility] TO THE HAVE A  PRE PILL PACK MEDICINE SERVICES.  Your physician recommends that you continue on your current medications as directed. Please refer to the Current Medication list given to you today.   *If you need a refill on your cardiac medications before your next appointment, please call your pharmacy*   Lab Work: [place]  If you have labs (blood work) drawn today and your tests are completely normal, you will receive your results only by: [place] (if you have MyChart) OR A paper copy in the mail If you have any lab test that is abnormal or we need to change your treatment, we will call you to review the results.   Testing/Procedures: NONE ORDERED  TODAY    Follow-Up: At [facility], you and your health needs are our priority.  As part of our continuing mission to provide you with exceptional heart care, we have created designated Provider Care Teams.  These Care Teams include your primary Cardiologist (physician) and Advanced Practice Providers (APPs -  Physician Assistants and Nurse Practitioners) who all work together to provide you with the care you need, when you need it.  We recommend signing up for the patient portal called "MyChart".  Sign up information is provided on this After Visit Summary.  MyChart is used to connect with patients for Virtual Visits (Telemedicine).  Patients are able to view lab/test results, encounter notes, upcoming appointments, etc.  Non-urgent messages can be sent to your provider as well.   To learn more about what you can do with MyChart, go to [email or web address].    Your next appointment:   6 week(s)  The format for your next appointment:   In Person  Provider:    Tommye Standard, PA-C    Other Instructions  START KEEPING A BLOOD PRESSURE LOG DAILY TO KEEP A RECORD OF BLOOD PRESSURE   Important Information About Sugar

## 2022-07-31 ENCOUNTER — Ambulatory Visit: Payer: Medicare Other | Admitting: Internal Medicine

## 2022-08-09 ENCOUNTER — Emergency Department (HOSPITAL_COMMUNITY): Payer: Medicare Other

## 2022-08-09 ENCOUNTER — Inpatient Hospital Stay (HOSPITAL_COMMUNITY)
Admission: EM | Admit: 2022-08-09 | Discharge: 2022-08-15 | DRG: 884 | Disposition: A | Discharge status: Skilled Nursing Facility | Payer: Medicare Other | Attending: Internal Medicine | Admitting: Internal Medicine

## 2022-08-09 ENCOUNTER — Encounter (HOSPITAL_COMMUNITY): Payer: Self-pay | Admitting: Emergency Medicine

## 2022-08-09 ENCOUNTER — Other Ambulatory Visit: Payer: Self-pay

## 2022-08-09 DIAGNOSIS — Z781 Physical restraint status: Secondary | ICD-10-CM

## 2022-08-09 DIAGNOSIS — E785 Hyperlipidemia, unspecified: Secondary | ICD-10-CM | POA: Diagnosis present

## 2022-08-09 DIAGNOSIS — Z8249 Family history of ischemic heart disease and other diseases of the circulatory system: Secondary | ICD-10-CM

## 2022-08-09 DIAGNOSIS — Z90710 Acquired absence of both cervix and uterus: Secondary | ICD-10-CM

## 2022-08-09 DIAGNOSIS — I4819 Other persistent atrial fibrillation: Secondary | ICD-10-CM | POA: Diagnosis present

## 2022-08-09 DIAGNOSIS — E876 Hypokalemia: Secondary | ICD-10-CM | POA: Diagnosis present

## 2022-08-09 DIAGNOSIS — Z885 Allergy status to narcotic agent status: Secondary | ICD-10-CM

## 2022-08-09 DIAGNOSIS — I4891 Unspecified atrial fibrillation: Secondary | ICD-10-CM | POA: Diagnosis not present

## 2022-08-09 DIAGNOSIS — G8929 Other chronic pain: Secondary | ICD-10-CM | POA: Diagnosis present

## 2022-08-09 DIAGNOSIS — I10 Essential (primary) hypertension: Secondary | ICD-10-CM | POA: Diagnosis present

## 2022-08-09 DIAGNOSIS — G9340 Encephalopathy, unspecified: Secondary | ICD-10-CM | POA: Diagnosis present

## 2022-08-09 DIAGNOSIS — R479 Unspecified speech disturbances: Secondary | ICD-10-CM | POA: Diagnosis not present

## 2022-08-09 DIAGNOSIS — Z881 Allergy status to other antibiotic agents status: Secondary | ICD-10-CM

## 2022-08-09 DIAGNOSIS — R4701 Aphasia: Secondary | ICD-10-CM | POA: Diagnosis present

## 2022-08-09 DIAGNOSIS — Z8542 Personal history of malignant neoplasm of other parts of uterus: Secondary | ICD-10-CM

## 2022-08-09 DIAGNOSIS — Z7901 Long term (current) use of anticoagulants: Secondary | ICD-10-CM

## 2022-08-09 DIAGNOSIS — F411 Generalized anxiety disorder: Secondary | ICD-10-CM | POA: Diagnosis present

## 2022-08-09 DIAGNOSIS — F03918 Unspecified dementia, unspecified severity, with other behavioral disturbance: Principal | ICD-10-CM | POA: Diagnosis present

## 2022-08-09 DIAGNOSIS — R258 Other abnormal involuntary movements: Secondary | ICD-10-CM | POA: Diagnosis present

## 2022-08-09 DIAGNOSIS — Z9841 Cataract extraction status, right eye: Secondary | ICD-10-CM

## 2022-08-09 DIAGNOSIS — Z1152 Encounter for screening for COVID-19: Secondary | ICD-10-CM

## 2022-08-09 DIAGNOSIS — R42 Dizziness and giddiness: Secondary | ICD-10-CM | POA: Diagnosis present

## 2022-08-09 DIAGNOSIS — M5030 Other cervical disc degeneration, unspecified cervical region: Secondary | ICD-10-CM | POA: Diagnosis present

## 2022-08-09 DIAGNOSIS — Z880 Allergy status to penicillin: Secondary | ICD-10-CM

## 2022-08-09 DIAGNOSIS — I639 Cerebral infarction, unspecified: Secondary | ICD-10-CM

## 2022-08-09 DIAGNOSIS — I708 Atherosclerosis of other arteries: Secondary | ICD-10-CM | POA: Diagnosis present

## 2022-08-09 DIAGNOSIS — Z823 Family history of stroke: Secondary | ICD-10-CM

## 2022-08-09 DIAGNOSIS — F03A4 Unspecified dementia, mild, with anxiety: Secondary | ICD-10-CM | POA: Diagnosis present

## 2022-08-09 DIAGNOSIS — R4702 Dysphasia: Secondary | ICD-10-CM | POA: Diagnosis present

## 2022-08-09 DIAGNOSIS — Z91041 Radiographic dye allergy status: Secondary | ICD-10-CM

## 2022-08-09 DIAGNOSIS — Z91040 Latex allergy status: Secondary | ICD-10-CM

## 2022-08-09 DIAGNOSIS — Z8673 Personal history of transient ischemic attack (TIA), and cerebral infarction without residual deficits: Secondary | ICD-10-CM

## 2022-08-09 DIAGNOSIS — Z9842 Cataract extraction status, left eye: Secondary | ICD-10-CM

## 2022-08-09 DIAGNOSIS — I480 Paroxysmal atrial fibrillation: Secondary | ICD-10-CM | POA: Diagnosis present

## 2022-08-09 DIAGNOSIS — Y92009 Unspecified place in unspecified non-institutional (private) residence as the place of occurrence of the external cause: Secondary | ICD-10-CM

## 2022-08-09 DIAGNOSIS — W19XXXA Unspecified fall, initial encounter: Secondary | ICD-10-CM | POA: Diagnosis present

## 2022-08-09 DIAGNOSIS — Z79899 Other long term (current) drug therapy: Secondary | ICD-10-CM

## 2022-08-09 DIAGNOSIS — Z888 Allergy status to other drugs, medicaments and biological substances status: Secondary | ICD-10-CM

## 2022-08-09 DIAGNOSIS — G459 Transient cerebral ischemic attack, unspecified: Secondary | ICD-10-CM | POA: Diagnosis present

## 2022-08-09 LAB — DIFFERENTIAL
Abs Immature Granulocytes: 0.04 10*3/uL (ref 0.00–0.07)
Basophils Absolute: 0.1 10*3/uL (ref 0.0–0.1)
Basophils Relative: 1 %
Eosinophils Absolute: 0.7 10*3/uL — ABNORMAL HIGH (ref 0.0–0.5)
Eosinophils Relative: 8 %
Immature Granulocytes: 1 %
Lymphocytes Relative: 15 %
Lymphs Abs: 1.3 10*3/uL (ref 0.7–4.0)
Monocytes Absolute: 0.6 10*3/uL (ref 0.1–1.0)
Monocytes Relative: 7 %
Neutro Abs: 5.8 10*3/uL (ref 1.7–7.7)
Neutrophils Relative %: 68 %

## 2022-08-09 LAB — RESP PANEL BY RT-PCR (FLU A&B, COVID) ARPGX2
Influenza A by PCR: NEGATIVE
Influenza B by PCR: NEGATIVE
SARS Coronavirus 2 by RT PCR: NEGATIVE

## 2022-08-09 LAB — URINALYSIS, ROUTINE W REFLEX MICROSCOPIC
Bacteria, UA: NONE SEEN
Bilirubin Urine: NEGATIVE
Glucose, UA: NEGATIVE mg/dL
Ketones, ur: NEGATIVE mg/dL
Nitrite: NEGATIVE
Protein, ur: NEGATIVE mg/dL
Specific Gravity, Urine: 1.036 — ABNORMAL HIGH (ref 1.005–1.030)
pH: 6 (ref 5.0–8.0)

## 2022-08-09 LAB — COMPREHENSIVE METABOLIC PANEL
ALT: 11 U/L (ref 0–44)
AST: 27 U/L (ref 15–41)
Albumin: 3.9 g/dL (ref 3.5–5.0)
Alkaline Phosphatase: 62 U/L (ref 38–126)
Anion gap: 14 (ref 5–15)
BUN: 15 mg/dL (ref 8–23)
CO2: 22 mmol/L (ref 22–32)
Calcium: 9.6 mg/dL (ref 8.9–10.3)
Chloride: 106 mmol/L (ref 98–111)
Creatinine, Ser: 1.36 mg/dL — ABNORMAL HIGH (ref 0.44–1.00)
GFR, Estimated: 38 mL/min — ABNORMAL LOW (ref >60)
Glucose, Bld: 97 mg/dL (ref 70–99)
Potassium: 3.7 mmol/L (ref 3.5–5.1)
Sodium: 142 mmol/L (ref 135–145)
Total Bilirubin: 0.7 mg/dL (ref 0.3–1.2)
Total Protein: 6.4 g/dL — ABNORMAL LOW (ref 6.5–8.1)

## 2022-08-09 LAB — I-STAT CHEM 8, ED
BUN: 17 mg/dL (ref 8–23)
Calcium, Ion: 1.1 mmol/L — ABNORMAL LOW (ref 1.15–1.40)
Chloride: 104 mmol/L (ref 98–111)
Creatinine, Ser: 1.4 mg/dL — ABNORMAL HIGH (ref 0.44–1.00)
Glucose, Bld: 98 mg/dL (ref 70–99)
HCT: 39 % (ref 36.0–46.0)
Hemoglobin: 13.3 g/dL (ref 12.0–15.0)
Potassium: 3.5 mmol/L (ref 3.5–5.1)
Sodium: 141 mmol/L (ref 135–145)
TCO2: 24 mmol/L (ref 22–32)

## 2022-08-09 LAB — CBC
HCT: 40.2 % (ref 36.0–46.0)
Hemoglobin: 13.6 g/dL (ref 12.0–15.0)
MCH: 32.1 pg (ref 26.0–34.0)
MCHC: 33.8 g/dL (ref 30.0–36.0)
MCV: 94.8 fL (ref 80.0–100.0)
Platelets: 142 10*3/uL — ABNORMAL LOW (ref 150–400)
RBC: 4.24 MIL/uL (ref 3.87–5.11)
RDW: 13.2 % (ref 11.5–15.5)
WBC: 8.4 10*3/uL (ref 4.0–10.5)
nRBC: 0 % (ref 0.0–0.2)

## 2022-08-09 LAB — ETHANOL: Alcohol, Ethyl (B): <10 mg/dL (ref <10)

## 2022-08-09 LAB — CBG MONITORING, ED: Glucose-Capillary: 104 mg/dL — ABNORMAL HIGH (ref 70–99)

## 2022-08-09 MED ORDER — IOHEXOL 350 MG/ML SOLN
75.0000 mL | Freq: Once | INTRAVENOUS | Status: AC | PRN
  Administered 2022-08-09: 75 mL via INTRAVENOUS

## 2022-08-09 MED ORDER — STROKE: EARLY STAGES OF RECOVERY BOOK: Freq: Once | Status: DC

## 2022-08-09 MED ORDER — LACTATED RINGERS IV BOLUS
1000.0000 mL | Freq: Once | INTRAVENOUS | Status: AC
  Administered 2022-08-09: 1000 mL via INTRAVENOUS

## 2022-08-09 MED ORDER — HALOPERIDOL LACTATE 5 MG/ML IJ SOLN
5.0000 mg | Freq: Four times a day (QID) | INTRAMUSCULAR | Status: DC | PRN
  Administered 2022-08-09 – 2022-08-10 (×2): 5 mg via INTRAMUSCULAR
  Filled 2022-08-09 (×2): qty 1

## 2022-08-09 MED ORDER — METOPROLOL TARTRATE 5 MG/5ML IV SOLN
5.0000 mg | INTRAVENOUS | Status: DC | PRN
  Administered 2022-08-09: 5 mg via INTRAVENOUS
  Filled 2022-08-09: qty 5

## 2022-08-09 MED ORDER — METOPROLOL TARTRATE 25 MG PO TABS
25.0000 mg | ORAL_TABLET | Freq: Two times a day (BID) | ORAL | Status: DC
  Administered 2022-08-10 – 2022-08-11 (×3): 25 mg via ORAL
  Filled 2022-08-09 (×3): qty 1

## 2022-08-09 MED ORDER — ACETAMINOPHEN 650 MG RE SUPP: 650.0000 mg | Freq: Four times a day (QID) | RECTAL | Status: DC | PRN

## 2022-08-09 MED ORDER — HALOPERIDOL LACTATE 5 MG/ML IJ SOLN: 5.0000 mg | Freq: Four times a day (QID) | INTRAMUSCULAR | Status: DC | PRN

## 2022-08-09 MED ORDER — DILTIAZEM HCL-DEXTROSE 125-5 MG/125ML-% IV SOLN (PREMIX)
5.0000 mg/h | INTRAVENOUS | Status: DC
  Administered 2022-08-10: 5 mg/h via INTRAVENOUS
  Filled 2022-08-09: qty 125

## 2022-08-09 MED ORDER — METOPROLOL TARTRATE 25 MG PO TABS
25.0000 mg | ORAL_TABLET | Freq: Once | ORAL | Status: DC
  Filled 2022-08-09: qty 1

## 2022-08-09 MED ORDER — MELATONIN 3 MG PO TABS: 3.0000 mg | ORAL_TABLET | Freq: Every evening | ORAL | Status: DC | PRN

## 2022-08-09 MED ORDER — RIVAROXABAN 15 MG PO TABS
15.0000 mg | ORAL_TABLET | Freq: Every day | ORAL | Status: DC
  Administered 2022-08-11 – 2022-08-14 (×4): 15 mg via ORAL
  Filled 2022-08-09 (×6): qty 1

## 2022-08-09 MED ORDER — DIPHENHYDRAMINE HCL 50 MG/ML IJ SOLN
50.0000 mg | Freq: Once | INTRAMUSCULAR | Status: AC
  Administered 2022-08-09: 50 mg via INTRAVENOUS
  Filled 2022-08-09: qty 1

## 2022-08-09 MED ORDER — METOPROLOL TARTRATE 5 MG/5ML IV SOLN
5.0000 mg | Freq: Once | INTRAVENOUS | Status: AC
  Administered 2022-08-09: 5 mg via INTRAVENOUS
  Filled 2022-08-09: qty 5

## 2022-08-09 MED ORDER — ACETAMINOPHEN 325 MG PO TABS: 650.0000 mg | ORAL_TABLET | Freq: Four times a day (QID) | ORAL | Status: DC | PRN

## 2022-08-09 MED ORDER — SODIUM CHLORIDE 0.9% FLUSH
3.0000 mL | Freq: Once | INTRAVENOUS | Status: AC
  Administered 2022-08-09: 3 mL via INTRAVENOUS

## 2022-08-09 NOTE — ED Notes (Signed)
Pt found standing in room un assisted. Pt had removed monitoring cords and IV's. Pt stated that she wanted to go home and did not want to stay here. RN's attempted to get patient back into bed but patient was combative and aggressive. MD notified and PRN ordered.

## 2022-08-09 NOTE — ED Triage Notes (Signed)
Pt bib ems as code stroke. EMS was called out to house for a fall when patient suddenly became aphasic and had difficulty following commands. LKW 1920. On xarelto. Pt in afib rvr with rate 80-150.

## 2022-08-09 NOTE — ED Provider Notes (Signed)
[facility] DEPART[facility] Provider Note   CSN: [id number] Arrival date & time: 08/09/22  1953  An emergency department physician performed an initial assessment on this suspected stroke patient at 40.  History  Chief Complaint  Patient presents with   Code Stroke    Tiffany Black is a 88 y.o. female.  Patient is an 88 year old female with a past medical history of A-fib on Xarelto, mild dementia with short-term memory loss though ANO x3 at baseline presenting to the emergency department with aphasia.  Per EMS, around 7:20 PM the patient started to have intermittent difficulty with word finding.  They state in route to the hospital she was occasionally able to speak in full sentences and then would have trouble finding her words.  They denied noticing any other neurologic deficits on their exam.  They state the family reported that she has had 3 falls this week with the last fall today.  Patient just reports that she "does not feel good" but denies any nausea, vomiting, diarrhea, dysuria, hematuria or any pain or fevers.  She does state that she has had a decreased appetite.  The history is provided by the EMS personnel. The history is limited by the condition of the patient (dementia, critical acuity).       Home Medications Prior to Admission medications   Medication Sig Start Date End Date Taking? Authorizing Provider  acetaminophen (TYLENOL) 500 MG tablet Take 500-1,000 mg by mouth every 8 (eight) hours as needed for mild pain or headache.   Yes [provider]  DIOVAN 160 MG tablet Take 80 mg by mouth 2 (two) times daily. 02/16/21  Yes [provider]  felodipine (PLENDIL) 5 MG 24 hr tablet TAKE 1 TABLET BY MOUTH EVERY DAY . Please keep upcoming appt in December 2022 with Dr. Caryl Comes before anymore refills. Thank you Final Attempt Patient taking differently: Take 5 mg by mouth daily. 08/16/21  Yes Deboraha Sprang, MD  FLUoxetine (PROZAC) 10  MG capsule Take 10 mg by mouth daily.   Yes [provider]  KLOR-CON M20 20 MEQ tablet Take 10 mEq by mouth daily.   Yes [provider]  Meclizine HCl 25 MG CHEW Chew 25 mg by mouth every 8 (eight) hours as needed (for dizziness). 06/03/21  Yes [provider]  metoprolol tartrate (LOPRESSOR) 50 MG tablet Take 25 mg by mouth 2 (two) times daily.   Yes [provider]  Rivaroxaban (XARELTO) 15 MG TABS tablet Take 1 tablet (15 mg total) by mouth daily with supper. 03/06/22  Yes Deboraha Sprang, MD  TYLENOL PM EXTRA STRENGTH 500-25 MG TABS tablet Take 2 tablets by mouth at bedtime.   Yes [provider]      Allergies    Hydrochlorothiazide, Penicillins, Atorvastatin, Ciprofloxacin, Doxycycline, Hydrocodone-acetaminophen, Iodinated contrast media, Iodine-131, Latex, Metoprolol, Statins, Carbamazepine, Codeine, Nickel, Sulfa antibiotics, Sulfamethoxazole, and Sulfonamide derivatives    Review of Systems   Review of Systems  Physical Exam Updated Vital Signs BP (!) 181/138   Pulse (!) 125   Temp 98.6 F (37 C) (Temporal)   Resp (!) 21   Ht '5\' 8"'$  (1.727 m)   Wt 69.3 kg   SpO2 100%   BMI 23.23 kg/m  Physical Exam Vitals and nursing note reviewed.  Constitutional:      General: She is not in acute distress.    Appearance: Normal appearance.  HENT:     Head: Normocephalic and atraumatic.  Nose: Nose normal.     Mouth/Throat:     Mouth: Mucous membranes are moist.     Pharynx: Oropharynx is clear.  Eyes:     Extraocular Movements: Extraocular movements intact.     Conjunctiva/sclera: Conjunctivae normal.     Pupils: Pupils are equal, round, and reactive to light.  Cardiovascular:     Rate and Rhythm: Tachycardia present. Rhythm irregular.     Pulses: Normal pulses.     Heart sounds: Normal heart sounds.  Pulmonary:     Effort: Pulmonary effort is normal.     Breath sounds: Normal breath sounds.  Abdominal:     General: Abdomen is  flat.     Palpations: Abdomen is soft.     Tenderness: There is no abdominal tenderness.  Musculoskeletal:        General: Normal range of motion.     Cervical back: Normal range of motion and neck supple.     Right lower leg: No edema.     Left lower leg: No edema.  Skin:    General: Skin is warm and dry.  Neurological:     General: No focal deficit present.     Mental Status: She is alert and oriented to person, place, and time.     Cranial Nerves: No cranial nerve deficit.     Sensory: No sensory deficit.     Motor: No weakness.     Comments: Speech fluent and clear  Psychiatric:        Mood and Affect: Mood normal.        Behavior: Behavior normal.     ED Results / Procedures / Treatments   Labs (all labs ordered are listed, but only abnormal results are displayed) Labs Reviewed  CBC - Abnormal; Notable for the following components:      Result Value   Platelets 142 (*)    All other components within normal limits  DIFFERENTIAL - Abnormal; Notable for the following components:   Eosinophils Absolute 0.7 (*)    All other components within normal limits  COMPREHENSIVE METABOLIC PANEL - Abnormal; Notable for the following components:   Creatinine, Ser 1.36 (*)    Total Protein 6.4 (*)    GFR, Estimated 38 (*)    All other components within normal limits  I-STAT CHEM 8, ED - Abnormal; Notable for the following components:   Creatinine, Ser 1.40 (*)    Calcium, Ion 1.10 (*)    All other components within normal limits  CBG MONITORING, ED - Abnormal; Notable for the following components:   Glucose-Capillary 104 (*)    All other components within normal limits  RESP PANEL BY RT-PCR (FLU A&B, COVID) ARPGX2  ETHANOL  PROTIME-INR  APTT  URINALYSIS, ROUTINE W REFLEX MICROSCOPIC    EKG EKG Interpretation  Date/Time:  Wednesday August 09 2022 20:44:20 EST Ventricular Rate:  130 PR Interval:    QRS Duration: 96 QT Interval:  331 QTC Calculation: 487 R  Axis:   -73 Text Interpretation: Atrial fibrillation with rapid ventricular response Left anterior fascicular block Abnormal R-wave progression, late transition LVH with secondary repolarization abnormality Borderline prolonged QT interval HR increased comapred to prior EKG Confirmed by Leanord Asal ([id number]) on 08/09/2022 9:01:21 PM  Radiology DG Chest Portable 1 View  Result Date: 08/09/2022 CLINICAL DATA:  Altered level of consciousness EXAM: PORTABLE CHEST 1 VIEW COMPARISON:  05/30/2022 FINDINGS: Single frontal view of the chest demonstrates an enlarged cardiac silhouette, stable. No acute airspace disease, effusion,  or pneumothorax. No acute bony abnormalities. IMPRESSION: 1. Stable chest, no acute process. Electronically Signed   By: Randa Ngo M.D.   On: 08/09/2022 21:15   CT ANGIO HEAD NECK W WO CM (CODE STROKE)  Result Date: 08/09/2022 CLINICAL DATA:  Aphasia EXAM: CT ANGIOGRAPHY HEAD AND NECK TECHNIQUE: Multidetector CT imaging of the head and neck was performed using the standard protocol during bolus administration of intravenous contrast. Multiplanar CT image reconstructions and MIPs were obtained to evaluate the vascular anatomy. Carotid stenosis measurements (when applicable) are obtained utilizing NASCET criteria, using the distal internal carotid diameter as the denominator. RADIATION DOSE REDUCTION: This exam was performed according to the departmental dose-optimization program which includes automated exposure control, adjustment of the mA and/or kV according to patient size and/or use of iterative reconstruction technique. CONTRAST:  52m OMNIPAQUE IOHEXOL 350 MG/ML SOLN COMPARISON:  Head CT same day FINDINGS: CTA NECK FINDINGS SKELETON: There is no bony spinal canal stenosis. No lytic or blastic lesion. OTHER NECK: Normal pharynx, larynx and major salivary glands. No cervical lymphadenopathy. Unremarkable thyroid gland. UPPER CHEST: No pneumothorax or pleural effusion. No  nodules or masses. AORTIC ARCH: There is no calcific atherosclerosis of the aortic arch. There is no aneurysm, dissection or hemodynamically significant stenosis of the visualized portion of the aorta. Conventional 3 vessel aortic branching pattern. The visualized proximal subclavian arteries are widely patent. RIGHT CAROTID SYSTEM: Normal without aneurysm, dissection or stenosis. LEFT CAROTID SYSTEM: Normal without aneurysm, dissection or stenosis. VERTEBRAL ARTERIES: Codominant configuration. Both origins are clearly patent. There is no dissection, occlusion or flow-limiting stenosis to the skull base (V1-V3 segments). CTA HEAD FINDINGS POSTERIOR CIRCULATION: --Vertebral arteries: Normal V4 segments. --Inferior cerebellar arteries: Normal. --Basilar artery: Normal. --Superior cerebellar arteries: Normal. --Posterior cerebral arteries (PCA): Normal. ANTERIOR CIRCULATION: --Intracranial internal carotid arteries: Normal. --Anterior cerebral arteries (ACA): Normal. Both A1 segments are present. Patent anterior communicating artery (a-comm). --Middle cerebral arteries (MCA): Normal. VENOUS SINUSES: As permitted by contrast timing, patent. ANATOMIC VARIANTS: None Review of the MIP images confirms the above findings. IMPRESSION: 1. No emergent large vessel occlusion or high-grade stenosis of the intracranial arteries. 2. Normal CTA of the neck. Electronically Signed   By: KUlyses JarredM.D.   On: 08/09/2022 20:54   CT HEAD CODE STROKE WO CONTRAST  Result Date: 08/09/2022 CLINICAL DATA:  Code stroke.  Acute neurologic deficit EXAM: CT HEAD WITHOUT CONTRAST TECHNIQUE: Contiguous axial images were obtained from the base of the skull through the vertex without intravenous contrast. RADIATION DOSE REDUCTION: This exam was performed according to the departmental dose-optimization program which includes automated exposure control, adjustment of the mA and/or kV according to patient size and/or use of iterative  reconstruction technique. COMPARISON:  07/08/2022 FINDINGS: Brain: There is no mass, hemorrhage or extra-axial collection. There is generalized atrophy without lobar predilection. There is hypoattenuation of the periventricular white matter, most commonly indicating chronic ischemic microangiopathy. Vascular: No abnormal hyperdensity of the major intracranial arteries or dural venous sinuses. No intracranial atherosclerosis. Skull: The visualized skull base, calvarium and extracranial soft tissues are normal. Sinuses/Orbits: No fluid levels or advanced mucosal thickening of the visualized paranasal sinuses. No mastoid or middle ear effusion. The orbits are normal. ASPECTS ([facility]) - Ganglionic level infarction (caudate, lentiform nuclei, internal capsule, insula, M1-M3 cortex): 7 - Supraganglionic infarction (M4-M6 cortex): 3 Total score (0-10 with 10 being normal): 10 IMPRESSION: 1. No acute intracranial abnormality. 2. ASPECTS is 10. These results were communicated to Dr.  Kerney Elbe at 8:16 pm on 08/09/2022 by text page via the [facility] messaging system. Electronically Signed   By: Ulyses Jarred M.D.   On: 08/09/2022 20:16    Procedures .Critical Care  Performed by: Kemper Durie, DO Authorized by: Kemper Durie, DO   Critical care provider statement:    Critical care time (minutes):  40   Critical care time was exclusive of:  Separately billable procedures and treating other patients   Critical care was necessary to treat or prevent imminent or life-threatening deterioration of the following conditions:  CNS failure or compromise and cardiac failure   Critical care was time spent personally by me on the following activities:  Development of treatment plan with patient or surrogate, discussions with consultants, discussions with primary provider, evaluation of patient's response to treatment, examination of patient, interpretation of cardiac output  measurements, obtaining history from patient or surrogate, ordering and performing treatments and interventions, ordering and review of laboratory studies, ordering and review of radiographic studies, pulse oximetry, re-evaluation of patient's condition and review of old charts   I assumed direction of critical care for this patient from another provider in my specialty: no     Care discussed with: admitting provider       Medications Ordered in ED Medications  metoprolol tartrate (LOPRESSOR) tablet 25 mg (has no administration in time range)  sodium chloride flush (NS) 0.9 % injection 3 mL (3 mLs Intravenous Given 08/09/22 2047)  diphenhydrAMINE (BENADRYL) injection 50 mg (50 mg Intravenous Given 08/09/22 2048)  iohexol (OMNIPAQUE) 350 MG/ML injection 75 mL (75 mLs Intravenous Contrast Given 08/09/22 2043)  lactated ringers bolus 1,000 mL (1,000 mLs Intravenous New Bag/Given 08/09/22 2104)  metoprolol tartrate (LOPRESSOR) injection 5 mg (5 mg Intravenous Given 08/09/22 2103)    ED Course/ Medical Decision Making/ A&P Clinical Course as of 08/09/22 2156  Wed Aug 09, 2022  2057 Neurology recommended MRI.  The patient was found to be in A-fib with RVR.  She reports that she has had a decreased appetite and appears mildly dry.  She will be given fluids as well as rate control with metoprolol.  She will additional infectious work-up to evaluate for cause of her general feeling of being unwell. [VK]  2129 Upon reassessment HR improved to 110s. She will require admission to hospitalist for further stroke w/u. [VK]    Clinical Course User Index [VK] Kemper Durie, DO                           Medical Decision Making This patient presents to the ED with chief complaint(s) of aphasia with pertinent past medical history of dementia, a fib on Xeralto, HTN, HLD which further complicates the presenting complaint. The complaint involves an extensive differential diagnosis and also carries with it  a high risk of complications and morbidity.    The differential diagnosis includes CVA, TIA, hypo or hyperglycemia, electrolyte abnormality, ICH/Mass effect, infection, arrhythmia    Additional history obtained: Additional history obtained from EMS  Records reviewed Primary Care Documents  ED Course and Reassessment: Patient was called as a prehospital arrival stroke alert and neurology and myself were immediately present at bedside to evaluate the patient on her arrival.  She was found to be hypertensive and tachycardic and irregular rhythm.  She had no obvious aphasia on exam and no other obvious neurologic deficits concerning for possible TIA or other etiology.  She underwent CT evaluation that  showed no acute abnormalities on CT head and patent vasculature on CTA.  Patient will undergo further medical work-up for cause of her aphasia and was recommended admission to medicine by neurology for MRI and further stroke work-up.  Patient was also found to be in A-fib with RVR on arrival here.  She does appear mildly dry  Independent labs interpretation:  The following labs were independently interpreted: within normal range   Independent visualization of imaging: - I independently visualized the following imaging with scope of interpretation limited to determining acute life threatening conditions related to emergency care: CTH/CTA, which revealed no acute abnormalities   Consultation: - Consulted or discussed management/test interpretation w/ external professional: neurology, hospitalist   Consideration for admission or further workup: Patient requires admission for further work-up for possible TIA Social Determinants of health: N/A    Amount and/or Complexity of Data Reviewed Labs: ordered. Radiology: ordered.          Final Clinical Impression(s) / ED Diagnoses Final diagnoses:  Transient speech disturbance  Atrial fibrillation with RVR [facility])    Rx / DC Orders ED  Discharge Orders     None         Kemper Durie, DO 08/09/22 2156

## 2022-08-09 NOTE — ED Notes (Signed)
Pt refusing to participate in swallow screen. MD notified and order for NPO placed.

## 2022-08-09 NOTE — Consult Note (Signed)
NEURO HOSPITALIST CONSULT NOTE   Requestig physician: Dr. Maylon Peppers  Reason for Consult:Acute onset of expressive aphasia  History obtained from:  EMS and Chart     HPI:                                                                                                                                          Tiffany Black is an 88 y.o. female with a PMHx of dementia, cataracts, strokes in 2015 and 2016, HLD, mild carotid atherosclerotic disease by ultrasound, HTN, insomnia and persistent atrial fibrillation (on Xarelto) who presents to the ED via EMS as a Code Stroke after she was noted to have become acutely aphasic during an EMS call for a fall she had sustained a few minutes earlier. On arrival to the ED she continued to have expressive and receptive dysphasia. LKN 1920.   Past Medical History:  Diagnosis Date   Anxiety    Arthritis    Bulging of cervical intervertebral disc    Cardiomegaly    Cataracts, bilateral    Chronic neck pain    History of endometrial cancer    History of stroke    2015 and 2016 // carotid US 8/16: Bilateral 1-39 // carotid US 2/16: Bilateral ICA 1-39   HLD (hyperlipidemia)    Hx of echocardiogram    a. Echo (11/15): Mild LVH, EF 55-60%, normal wall motion, grade 2 diastolic dysfunction, mild LAE, normal RV function, PASP 29 mm Hg // echo 8/16:Mild concentric LVH, EF 55-60, normal wall motion, grade 2 diastolic dysfunction, mild LAE, PASP 39   Hypertension    Insomnia    Lung nodule    OA (osteoarthritis)    Pectus excavatum    Persistent atrial fibrillation (HCC)    Vertigo     Past Surgical History:  Procedure Laterality Date   ABDOMINAL HYSTERECTOMY  2011   CATARACT EXTRACTION, BILATERAL Bilateral 2009   CESAREAN SECTION  1975   LAPAROSCOPIC HYSTERECTOMY  2011    Family History  Problem Relation Age of Onset   Dementia Mother    Heart attack Father    Heart disease Father    Stroke Maternal Uncle    Hypertension Neg Hx               Social History:  reports that she has never smoked. She has never used smokeless tobacco. She reports that she does not drink alcohol and does not use drugs.  Allergies  Allergen Reactions   Hydrochlorothiazide Other (See Comments)    Patient doesn't recall the reaction   Penicillins Hives    Has patient had a PCN reaction causing immediate rash, facial/tongue/throat swelling, SOB or lightheadedness with hypotension: Yes Has patient had a PCN reaction causing severe rash involving mucus membranes or  skin necrosis: No Has patient had a PCN reaction that required hospitalization No Has patient had a PCN reaction occurring within the last 10 years: No If all of the above answers are "NO", then may proceed with Cephalosporin use.    Ciprofloxacin Other (See Comments)    Blood pressure issues- patient doesn't recall if it increased OR decreased it   Doxycycline Diarrhea and Nausea And Vomiting   Hydrocodone-Acetaminophen Other (See Comments)    Unknown reaction   Iodine-131 Other (See Comments) and Hypertension    Raised the blood pressure   Latex Itching   Penicillins    Statins Other (See Comments)    Muscle weakness   Sulfa Antibiotics    Carbamazepine Other (See Comments) and Rash    Flushed blood pressure medication out of system   Codeine Nausea And Vomiting    Nausea and vomiting   Sulfamethoxazole Hives and Rash   Sulfonamide Derivatives Hives and Rash         MEDICATIONS:                                                                                                                      No current facility-administered medications on file prior to encounter.   Current Outpatient Medications on File Prior to Encounter  Medication Sig Dispense Refill   acetaminophen (TYLENOL) 325 MG tablet Take 650 mg by mouth every 6 (six) hours as needed for mild pain.     acetaminophen (TYLENOL) 500 MG tablet Take 500 mg by mouth every 6 (six) hours as needed for mild  pain or headache.     Cholecalciferol (VITAMIN D-3 PO) Take 1 capsule by mouth daily.     DIOVAN 160 MG tablet Take 80 mg by mouth 2 (two) times daily.     felodipine (PLENDIL) 5 MG 24 hr tablet TAKE 1 TABLET BY MOUTH EVERY DAY . Please keep upcoming appt in December 2022 with Dr. Caryl Comes before anymore refills. Thank you Final Attempt 30 tablet 0   FLUoxetine (PROZAC) 10 MG capsule Take 10 mg by mouth daily.     KLOR-CON M20 20 MEQ tablet TAKE 1/2 TABLET BY MOUTH EVERY DAY 90 DAYS for 90     Meclizine HCl 25 MG CHEW 1 tablet Orally every 8 hours as needed for 30 days     metoprolol tartrate (LOPRESSOR) 50 MG tablet Take 0.5 tablets by mouth 2 (two) times daily.     Rivaroxaban (XARELTO) 15 MG TABS tablet Take 1 tablet (15 mg total) by mouth daily with supper. 30 tablet 5     ROS:  Unable to obtain due to aphasia.     BP (!) 181/138   Pulse (!) 125   Temp 98.6 F (37 C) (Temporal)   Resp (!) 21   Ht '5\' 8"'$  (1.727 m)   Wt 69.3 kg   SpO2 100%   BMI 23.23 kg/m    General Examination:                                                                                                       Physical Exam  HEENT-  [place]/AT   Lungs- Respirations unlabored Extremities- No edema  Neurological Examination Mental Status: Awake and alert. No agitation noted, but appears surprised and confused. She has halting speech, with occasional paraphasias and fluent nonsensical output when replying to questions, but with fluent comprehensible output with spontaneous statements by her such as "what is wrong with me?". Significant word-finding deficit: Could not name any fingers or her ear, but correctly identified her nose and chin with some delay. Unable to answer any orientation questions, saying "I don't know" and appearing frustrated in response to several questions. Follows  about 50% of all verbal commands. No hemineglect noted.  Cranial Nerves: II: Blinks to threat in temporal visual fields OU. No extinction to DSS. PERRL. Tracks visual stimuli. Makes eye contact.   III,IV, VI: No ptosis. EOMI. No nystagmus.  V: Reacts to touch bilaterally  VII: Smile symmetric VIII: Hearing intact to voice IX,X: No hypophonia or hoarseness XI: Symmetric XII: Midline tongue extension Motor: Right : Upper extremity   5/5    Left:     Upper extremity   5/5  Lower extremity   5/5     Lower extremity   5/5 No pronator drift Sensory: Reacts to touch x 4. No extinction to DSS.  Deep Tendon Reflexes: 2+ and symmetric brachioradialis. 2 beats clonus with brisk patellar reflexes bilaterally. Difficult to elicit throughout Plantars: Right: downgoing   Left: downgoing Cerebellar: No ataxia with FNF bilaterally Gait: Unable to assess   Lab Results: Basic Metabolic Panel: Recent Labs  Lab 08/09/22 2001  NA 141  K 3.5  CL 104  GLUCOSE 98  BUN 17  CREATININE 1.40*    CBC: Recent Labs  Lab 08/09/22 2001  HGB 13.3  HCT 39.0    Cardiac Enzymes: No results for input(s): "CKTOTAL", "CKMB", "CKMBINDEX", "TROPONINI" in the last 168 hours.  Lipid Panel: No results for input(s): "CHOL", "TRIG", "HDL", "CHOLHDL", "VLDL", "LDLCALC" in the last 168 hours.  Imaging: No results found.   Assessment: 88 year old female with chronic atrial fibrillation on Xarelto, presenting with acute onset of expressive > receptive aphasia.  - Exam reveals halting speech, with occasional paraphasias and fluent nonsensical output when replying to questions, but with fluent comprehensible output with spontaneous statements by her such as "what is wrong with me?" - Preliminary review of CT reveals no acute hemorrhage. Chronic small vessel ischemic changes are noted.  - Official CT report: No acute intracranial abnormality. ASPECTS is 10. - Not a candidate for IV thrombolysis as she is on a  DOAC - Cr low but within the safe range for CTA. Has a listed allergy to iodine 131 (listed reaction is hypertension) but has had CT scans with contrast in the past. Benefits of contrast administration for STAT CTA to assess for possible LVO in acute stroke setting are felt to significantly outweigh the risks. Will medicate with Benadryl 50 mg IV x 1.  - CTA of head and neck: No emergent large vessel occlusion or high-grade stenosis of the intracranial arteries. Normal CTA of the neck. - She has a listed allergy to statins - Will need full stroke work up, although most likely etiology for her presentation is secondary to atrial fibrillation with cardioembolic stroke despite anticoagulation.  Recommendations: - HgbA1c, fasting lipid panel - MRI of the brain without contrast - PT consult, OT consult, Speech consult - TTE  - Continue Xarelto  - Modified permissive HTN protocol given that she is on Xarelto and is frail/elderly: SBP goal < 180 x 24 hours.  - IVF - Telemetry monitoring - Frequent neuro checks - NPO until passes stroke swallow screen   Electronically signed: Dr. Kerney Elbe 08/09/2022, 8:12 PM

## 2022-08-09 NOTE — ED Notes (Signed)
Trauma Event Note    TRN assisted with non activated trauma, fall on thinners with NO head injury. On xarelto, multiple falls this week. Presents with aphasia, confused with dementia at baseline. No external trauma, uncontrolled hemorrhage on arrival. Assisted with CT and initial exam by neurology, EDP.  Last imported Vital Signs BP (!) 153/112   Pulse (!) 156   Temp 98.6 F (37 C) (Temporal)   Resp 18   Ht '5\' 8"'$  (1.727 m)   Wt 152 lb 12.5 oz (69.3 kg)   SpO2 98%   BMI 23.23 kg/m   Trending CBC Recent Labs    08/09/22 1955 08/09/22 2001  WBC 8.4  --   HGB 13.6 13.3  HCT 40.2 39.0  PLT 142*  --     Trending Coag's No results for input(s): "APTT", "INR" in the last 72 hours.  Trending BMET Recent Labs    08/09/22 2001  NA 141  K 3.5  CL 104  BUN 17  CREATININE 1.40*  GLUCOSE 98      Yolandra Habig O Verla Bryngelson  Trauma Response RN  Please call TRN at [phone] for further assistance.

## 2022-08-09 NOTE — H&P (Signed)
History and Physical      Tiffany Black RKY:[id number] DOB: 08-19-1934 DOA: 08/09/2022  PCP: Tiffany Black., MD *** Patient coming from: home ***  I have personally briefly reviewed patient's old medical records in [place]  Chief Complaint: ***  HPI: Tiffany Black is a 88 y.o. female with medical history significant for *** who is admitted to [facility] on 08/09/2022 with *** after presenting from home*** to [facility] ED complaining of ***.    ***       ***   ED Course:  Vital signs in the ED were notable for the following: ***  Labs were notable for the following: ***  Per my interpretation, EKG in ED demonstrated the following:  ***  Imaging and additional notable ED work-up: ***  While in the ED, the following were administered: ***  Subsequently, the patient was admitted  ***  ***red    Review of Systems: As per HPI otherwise 10 point review of systems negative.   Past Medical History:  Diagnosis Date   Anxiety    Arthritis    Bulging of cervical intervertebral disc    Cardiomegaly    Cataracts, bilateral    Chronic neck pain    History of endometrial cancer    History of stroke    2015 and 2016 // carotid US 8/16: Bilateral 1-39 // carotid US 2/16: Bilateral ICA 1-39   HLD (hyperlipidemia)    Hx of echocardiogram    a. Echo (11/15): Mild LVH, EF 55-60%, normal wall motion, grade 2 diastolic dysfunction, mild LAE, normal RV function, PASP 29 mm Hg // echo 8/16:Mild concentric LVH, EF 55-60, normal wall motion, grade 2 diastolic dysfunction, mild LAE, PASP 39   Hypertension    Insomnia    Lung nodule    OA (osteoarthritis)    Pectus excavatum    Persistent atrial fibrillation ([place])    Vertigo     Past Surgical History:  Procedure Laterality Date   ABDOMINAL HYSTERECTOMY  2011   CATARACT EXTRACTION, BILATERAL Bilateral 2009   CESAREAN SECTION  1975   LAPAROSCOPIC HYSTERECTOMY  2011    Social History:  reports that she has  never smoked. She has never used smokeless tobacco. She reports that she does not drink alcohol and does not use drugs.   Allergies  Allergen Reactions   Hydrochlorothiazide Hives   Penicillins Hives    Has patient had a PCN reaction causing immediate rash, facial/tongue/throat swelling, SOB or lightheadedness with hypotension: Yes Has patient had a PCN reaction causing severe rash involving mucus membranes or skin necrosis: No Has patient had a PCN reaction that required hospitalization No Has patient had a PCN reaction occurring within the last 10 years: No If all of the above answers are "NO", then may proceed with Cephalosporin use.    Atorvastatin Other (See Comments)    "Weakness"   Ciprofloxacin Other (See Comments) and Hypertension    Blood pressure issues- patient doesn't recall if it increased OR decreased it     Doxycycline Diarrhea and Nausea And Vomiting   Hydrocodone-Acetaminophen Other (See Comments)    Reaction not recalled   Iodinated Contrast Media Diarrhea, Other (See Comments) and Hypertension    Elevated the B/P   Iodine-131 Other (See Comments) and Hypertension    Raised the blood pressure   Latex Itching   Metoprolol Other (See Comments)    Reaction not recalled- is tolerating this in 2023   Statins Other (See Comments)  Muscle weakness   Carbamazepine Other (See Comments) and Rash    Flushed blood pressure medication out of system   Codeine Nausea And Vomiting   Nickel Rash   Sulfa Antibiotics Hives and Rash   Sulfamethoxazole Hives and Rash   Sulfonamide Derivatives Hives and Rash         Family History  Problem Relation Age of Onset   Dementia Mother    Heart attack Father    Heart disease Father    Stroke Maternal Uncle    Hypertension Neg Hx     Family history reviewed and not pertinent ***   Prior to Admission medications   Medication Sig Start Date End Date Taking? Authorizing Provider  acetaminophen (TYLENOL) 500 MG tablet Take  500-1,000 mg by mouth every 8 (eight) hours as needed for mild pain or headache.   Yes [provider]  DIOVAN 160 MG tablet Take 80 mg by mouth 2 (two) times daily. 02/16/21  Yes [provider]  felodipine (PLENDIL) 5 MG 24 hr tablet TAKE 1 TABLET BY MOUTH EVERY DAY . Please keep upcoming appt in December 2022 with Dr. Caryl Comes before anymore refills. Thank you Final Attempt Patient taking differently: Take 5 mg by mouth daily. 08/16/21  Yes Tiffany Sprang, MD  FLUoxetine (PROZAC) 10 MG capsule Take 10 mg by mouth daily.   Yes [provider]  KLOR-CON M20 20 MEQ tablet Take 10 mEq by mouth daily.   Yes [provider]  Meclizine HCl 25 MG CHEW Chew 25 mg by mouth every 8 (eight) hours as needed (for dizziness). 06/03/21  Yes [provider]  metoprolol tartrate (LOPRESSOR) 50 MG tablet Take 25 mg by mouth 2 (two) times daily.   Yes [provider]  Rivaroxaban (XARELTO) 15 MG TABS tablet Take 1 tablet (15 mg total) by mouth daily with supper. 03/06/22  Yes Tiffany Sprang, MD  TYLENOL PM EXTRA STRENGTH 500-25 MG TABS tablet Take 2 tablets by mouth at bedtime.   Yes [provider]     Objective    Physical Exam: Vitals:   08/09/22 2050 08/09/22 2100 08/09/22 2115 08/09/22 2130  BP:  (!) 189/138 (!) 181/138   Pulse:  (!) 145 97 (!) 125  Resp:  (!) 22 (!) 24 (!) 21  Temp:      TempSrc:      SpO2:  96% 97% 100%  Weight: 69.3 kg     Height: '5\' 8"'$  (1.727 m)       General: appears to be stated age; alert, oriented Skin: warm, dry, no rash Head:  AT/[place] Mouth:  Oral mucosa membranes appear moist, normal dentition Neck: supple; trachea midline Heart:  RRR; did not appreciate any M/R/G Lungs: CTAB, did not appreciate any wheezes, rales, or rhonchi Abdomen: + BS; soft, ND, NT Vascular: 2+ pedal pulses b/l; 2+ radial pulses b/l Extremities: no peripheral edema, no muscle wasting Neuro: strength and sensation intact in upper and  lower extremities b/l ***   *** Neuro: 5/5 strength of the proximal and distal flexors and extensors of the upper and lower extremities bilaterally; sensation intact in upper and lower extremities b/l; cranial nerves II through XII grossly intact; no pronator drift; no evidence suggestive of slurred speech, dysarthria, or facial droop; Normal muscle tone. No tremors.  *** Neuro: In the setting of the patient's current mental status and associated inability to follow instructions, unable to perform full neurologic exam at this time.  As such, assessment  of strength, sensation, and cranial nerves is limited at this time. Patient noted to spontaneously move all 4 extremities. No tremors.  ***    Labs on Admission: I have personally reviewed following labs and imaging studies  CBC: Recent Labs  Lab 08/09/22 1955 08/09/22 2001  WBC 8.4  --   NEUTROABS 5.8  --   HGB 13.6 13.3  HCT 40.2 39.0  MCV 94.8  --   PLT 142*  --    Basic Metabolic Panel: Recent Labs  Lab 08/09/22 1955 08/09/22 2001  NA 142 141  K 3.7 3.5  CL 106 104  CO2 22  --   GLUCOSE 97 98  BUN 15 17  CREATININE 1.36* 1.40*  CALCIUM 9.6  --    GFR: Estimated Creatinine Clearance: 29.1 mL/min (A) (by C-G formula based on SCr of 1.4 mg/dL (H)). Liver Function Tests: Recent Labs  Lab 08/09/22 1955  AST 27  ALT 11  ALKPHOS 62  BILITOT 0.7  PROT 6.4*  ALBUMIN 3.9   No results for input(s): "LIPASE", "AMYLASE" in the last 168 hours. No results for input(s): "AMMONIA" in the last 168 hours. Coagulation Profile: No results for input(s): "INR", "PROTIME" in the last 168 hours. Cardiac Enzymes: No results for input(s): "CKTOTAL", "CKMB", "CKMBINDEX", "TROPONINI" in the last 168 hours. BNP (last 3 results) No results for input(s): "PROBNP" in the last 8760 hours. HbA1C: No results for input(s): "HGBA1C" in the last 72 hours. CBG: Recent Labs  Lab 08/09/22 1957  GLUCAP 104*   Lipid Profile: No results  for input(s): "CHOL", "HDL", "LDLCALC", "TRIG", "CHOLHDL", "LDLDIRECT" in the last 72 hours. Thyroid Function Tests: No results for input(s): "TSH", "T4TOTAL", "FREET4", "T3FREE", "THYROIDAB" in the last 72 hours. Anemia Panel: No results for input(s): "VITAMINB12", "FOLATE", "FERRITIN", "TIBC", "IRON", "RETICCTPCT" in the last 72 hours. Urine analysis:    Component Value Date/Time   COLORURINE STRAW (A) 07/08/2022 0906   APPEARANCEUR CLEAR 07/08/2022 0906   LABSPEC 1.005 07/08/2022 0906   PHURINE 5.0 07/08/2022 0906   GLUCOSEU NEGATIVE 07/08/2022 0906   HGBUR NEGATIVE 07/08/2022 0906   BILIRUBINUR NEGATIVE 07/08/2022 0906   KETONESUR NEGATIVE 07/08/2022 0906   PROTEINUR NEGATIVE 07/08/2022 0906   UROBILINOGEN 0.2 05/22/2015 1950   NITRITE NEGATIVE 07/08/2022 0906   LEUKOCYTESUR SMALL (A) 07/08/2022 0906    Radiological Exams on Admission: DG Chest Portable 1 View  Result Date: 08/09/2022 CLINICAL DATA:  Altered level of consciousness EXAM: PORTABLE CHEST 1 VIEW COMPARISON:  05/30/2022 FINDINGS: Single frontal view of the chest demonstrates an enlarged cardiac silhouette, stable. No acute airspace disease, effusion, or pneumothorax. No acute bony abnormalities. IMPRESSION: 1. Stable chest, no acute process. Electronically Signed   By: Randa Ngo M.D.   On: 08/09/2022 21:15   CT ANGIO HEAD NECK W WO CM (CODE STROKE)  Result Date: 08/09/2022 CLINICAL DATA:  Aphasia EXAM: CT ANGIOGRAPHY HEAD AND NECK TECHNIQUE: Multidetector CT imaging of the head and neck was performed using the standard protocol during bolus administration of intravenous contrast. Multiplanar CT image reconstructions and MIPs were obtained to evaluate the vascular anatomy. Carotid stenosis measurements (when applicable) are obtained utilizing NASCET criteria, using the distal internal carotid diameter as the denominator. RADIATION DOSE REDUCTION: This exam was performed according to the departmental  dose-optimization program which includes automated exposure control, adjustment of the mA and/or kV according to patient size and/or use of iterative reconstruction technique. CONTRAST:  31m OMNIPAQUE IOHEXOL 350 MG/ML SOLN COMPARISON:  Head CT same day  FINDINGS: CTA NECK FINDINGS SKELETON: There is no bony spinal canal stenosis. No lytic or blastic lesion. OTHER NECK: Normal pharynx, larynx and major salivary glands. No cervical lymphadenopathy. Unremarkable thyroid gland. UPPER CHEST: No pneumothorax or pleural effusion. No nodules or masses. AORTIC ARCH: There is no calcific atherosclerosis of the aortic arch. There is no aneurysm, dissection or hemodynamically significant stenosis of the visualized portion of the aorta. Conventional 3 vessel aortic branching pattern. The visualized proximal subclavian arteries are widely patent. RIGHT CAROTID SYSTEM: Normal without aneurysm, dissection or stenosis. LEFT CAROTID SYSTEM: Normal without aneurysm, dissection or stenosis. VERTEBRAL ARTERIES: Codominant configuration. Both origins are clearly patent. There is no dissection, occlusion or flow-limiting stenosis to the skull base (V1-V3 segments). CTA HEAD FINDINGS POSTERIOR CIRCULATION: --Vertebral arteries: Normal V4 segments. --Inferior cerebellar arteries: Normal. --Basilar artery: Normal. --Superior cerebellar arteries: Normal. --Posterior cerebral arteries (PCA): Normal. ANTERIOR CIRCULATION: --Intracranial internal carotid arteries: Normal. --Anterior cerebral arteries (ACA): Normal. Both A1 segments are present. Patent anterior communicating artery (a-comm). --Middle cerebral arteries (MCA): Normal. VENOUS SINUSES: As permitted by contrast timing, patent. ANATOMIC VARIANTS: None Review of the MIP images confirms the above findings. IMPRESSION: 1. No emergent large vessel occlusion or high-grade stenosis of the intracranial arteries. 2. Normal CTA of the neck. Electronically Signed   By: Ulyses Jarred M.D.   On:  08/09/2022 20:54   CT HEAD CODE STROKE WO CONTRAST  Result Date: 08/09/2022 CLINICAL DATA:  Code stroke.  Acute neurologic deficit EXAM: CT HEAD WITHOUT CONTRAST TECHNIQUE: Contiguous axial images were obtained from the base of the skull through the vertex without intravenous contrast. RADIATION DOSE REDUCTION: This exam was performed according to the departmental dose-optimization program which includes automated exposure control, adjustment of the mA and/or kV according to patient size and/or use of iterative reconstruction technique. COMPARISON:  07/08/2022 FINDINGS: Brain: There is no mass, hemorrhage or extra-axial collection. There is generalized atrophy without lobar predilection. There is hypoattenuation of the periventricular white matter, most commonly indicating chronic ischemic microangiopathy. Vascular: No abnormal hyperdensity of the major intracranial arteries or dural venous sinuses. No intracranial atherosclerosis. Skull: The visualized skull base, calvarium and extracranial soft tissues are normal. Sinuses/Orbits: No fluid levels or advanced mucosal thickening of the visualized paranasal sinuses. No mastoid or middle ear effusion. The orbits are normal. ASPECTS [facility] Stroke Program Early CT Score) - Ganglionic level infarction (caudate, lentiform nuclei, internal capsule, insula, M1-M3 cortex): 7 - Supraganglionic infarction (M4-M6 cortex): 3 Total score (0-10 with 10 being normal): 10 IMPRESSION: 1. No acute intracranial abnormality. 2. ASPECTS is 10. These results were communicated to Dr. Kerney Elbe at 8:16 pm on 08/09/2022 by text page via the [facility] messaging system. Electronically Signed   By: Ulyses Jarred M.D.   On: 08/09/2022 20:16      Assessment/Plan   Principal Problem:    Aphasia   ***       ***            ***             ***            ***            ***            ***             ***           ***           ***   ***  DVT  prophylaxis: SCD's ***  Code Status: Full code*** Family Communication: none*** Disposition Plan: Per Rounding Team Consults called: none***;  Admission status: ***     I SPENT GREATER THAN 75 *** MINUTES IN CLINICAL CARE TIME/MEDICAL DECISION-MAKING IN COMPLETING THIS ADMISSION.      [place] DO [facility]  From [place]   08/09/2022, 10:04 PM   ***

## 2022-08-10 ENCOUNTER — Observation Stay (HOSPITAL_COMMUNITY): Payer: Medicare Other

## 2022-08-10 DIAGNOSIS — E876 Hypokalemia: Secondary | ICD-10-CM | POA: Diagnosis present

## 2022-08-10 DIAGNOSIS — F03918 Unspecified dementia, unspecified severity, with other behavioral disturbance: Secondary | ICD-10-CM | POA: Diagnosis present

## 2022-08-10 DIAGNOSIS — G9340 Encephalopathy, unspecified: Secondary | ICD-10-CM | POA: Diagnosis present

## 2022-08-10 DIAGNOSIS — R4701 Aphasia: Secondary | ICD-10-CM | POA: Diagnosis present

## 2022-08-10 DIAGNOSIS — Z79899 Other long term (current) drug therapy: Secondary | ICD-10-CM | POA: Diagnosis not present

## 2022-08-10 DIAGNOSIS — M5030 Other cervical disc degeneration, unspecified cervical region: Secondary | ICD-10-CM | POA: Diagnosis present

## 2022-08-10 DIAGNOSIS — G9341 Metabolic encephalopathy: Secondary | ICD-10-CM

## 2022-08-10 DIAGNOSIS — G8929 Other chronic pain: Secondary | ICD-10-CM | POA: Diagnosis present

## 2022-08-10 DIAGNOSIS — Z880 Allergy status to penicillin: Secondary | ICD-10-CM | POA: Diagnosis not present

## 2022-08-10 DIAGNOSIS — Z885 Allergy status to narcotic agent status: Secondary | ICD-10-CM | POA: Diagnosis not present

## 2022-08-10 DIAGNOSIS — Z9842 Cataract extraction status, left eye: Secondary | ICD-10-CM | POA: Diagnosis not present

## 2022-08-10 DIAGNOSIS — E785 Hyperlipidemia, unspecified: Secondary | ICD-10-CM | POA: Diagnosis present

## 2022-08-10 DIAGNOSIS — I4891 Unspecified atrial fibrillation: Secondary | ICD-10-CM | POA: Diagnosis not present

## 2022-08-10 DIAGNOSIS — R4182 Altered mental status, unspecified: Secondary | ICD-10-CM | POA: Diagnosis not present

## 2022-08-10 DIAGNOSIS — Y92009 Unspecified place in unspecified non-institutional (private) residence as the place of occurrence of the external cause: Secondary | ICD-10-CM | POA: Diagnosis not present

## 2022-08-10 DIAGNOSIS — G459 Transient cerebral ischemic attack, unspecified: Secondary | ICD-10-CM | POA: Diagnosis not present

## 2022-08-10 DIAGNOSIS — Z1152 Encounter for screening for COVID-19: Secondary | ICD-10-CM | POA: Diagnosis not present

## 2022-08-10 DIAGNOSIS — R479 Unspecified speech disturbances: Secondary | ICD-10-CM | POA: Diagnosis not present

## 2022-08-10 DIAGNOSIS — Z781 Physical restraint status: Secondary | ICD-10-CM | POA: Diagnosis not present

## 2022-08-10 DIAGNOSIS — Z888 Allergy status to other drugs, medicaments and biological substances status: Secondary | ICD-10-CM | POA: Diagnosis not present

## 2022-08-10 DIAGNOSIS — I4819 Other persistent atrial fibrillation: Secondary | ICD-10-CM | POA: Diagnosis present

## 2022-08-10 DIAGNOSIS — R4702 Dysphasia: Secondary | ICD-10-CM | POA: Diagnosis present

## 2022-08-10 DIAGNOSIS — I708 Atherosclerosis of other arteries: Secondary | ICD-10-CM | POA: Diagnosis present

## 2022-08-10 DIAGNOSIS — Z9841 Cataract extraction status, right eye: Secondary | ICD-10-CM | POA: Diagnosis not present

## 2022-08-10 DIAGNOSIS — I10 Essential (primary) hypertension: Secondary | ICD-10-CM | POA: Diagnosis present

## 2022-08-10 DIAGNOSIS — Z91041 Radiographic dye allergy status: Secondary | ICD-10-CM | POA: Diagnosis not present

## 2022-08-10 DIAGNOSIS — F01B0 Vascular dementia, moderate, without behavioral disturbance, psychotic disturbance, mood disturbance, and anxiety: Secondary | ICD-10-CM

## 2022-08-10 DIAGNOSIS — F03A4 Unspecified dementia, mild, with anxiety: Secondary | ICD-10-CM | POA: Diagnosis present

## 2022-08-10 DIAGNOSIS — Z8673 Personal history of transient ischemic attack (TIA), and cerebral infarction without residual deficits: Secondary | ICD-10-CM | POA: Diagnosis not present

## 2022-08-10 DIAGNOSIS — Z8542 Personal history of malignant neoplasm of other parts of uterus: Secondary | ICD-10-CM | POA: Diagnosis not present

## 2022-08-10 DIAGNOSIS — W19XXXA Unspecified fall, initial encounter: Secondary | ICD-10-CM | POA: Diagnosis present

## 2022-08-10 DIAGNOSIS — F411 Generalized anxiety disorder: Secondary | ICD-10-CM | POA: Diagnosis present

## 2022-08-10 LAB — CBC WITH DIFFERENTIAL/PLATELET
Abs Immature Granulocytes: 0.03 10*3/uL (ref 0.00–0.07)
Basophils Absolute: 0 10*3/uL (ref 0.0–0.1)
Basophils Relative: 0 %
Eosinophils Absolute: 0 10*3/uL (ref 0.0–0.5)
Eosinophils Relative: 0 %
HCT: 40.4 % (ref 36.0–46.0)
Hemoglobin: 13.4 g/dL (ref 12.0–15.0)
Immature Granulocytes: 0 %
Lymphocytes Relative: 7 %
Lymphs Abs: 0.6 10*3/uL — ABNORMAL LOW (ref 0.7–4.0)
MCH: 31.5 pg (ref 26.0–34.0)
MCHC: 33.2 g/dL (ref 30.0–36.0)
MCV: 95.1 fL (ref 80.0–100.0)
Monocytes Absolute: 0.3 10*3/uL (ref 0.1–1.0)
Monocytes Relative: 3 %
Neutro Abs: 8.1 10*3/uL — ABNORMAL HIGH (ref 1.7–7.7)
Neutrophils Relative %: 90 %
Platelets: 149 10*3/uL — ABNORMAL LOW (ref 150–400)
RBC: 4.25 MIL/uL (ref 3.87–5.11)
RDW: 13.2 % (ref 11.5–15.5)
WBC: 9 10*3/uL (ref 4.0–10.5)
nRBC: 0 % (ref 0.0–0.2)

## 2022-08-10 LAB — I-STAT VENOUS BLOOD GAS, ED
Acid-Base Excess: 2 mmol/L (ref 0.0–2.0)
Bicarbonate: 23.3 mmol/L (ref 20.0–28.0)
Calcium, Ion: 0.95 mmol/L — ABNORMAL LOW (ref 1.15–1.40)
HCT: 42 % (ref 36.0–46.0)
Hemoglobin: 14.3 g/dL (ref 12.0–15.0)
O2 Saturation: 95 %
Potassium: 3.8 mmol/L (ref 3.5–5.1)
Sodium: 136 mmol/L (ref 135–145)
TCO2: 24 mmol/L (ref 22–32)
pCO2, Ven: 27.5 mmHg — ABNORMAL LOW (ref 44–60)
pH, Ven: 7.536 — ABNORMAL HIGH (ref 7.25–7.43)
pO2, Ven: 63 mmHg — ABNORMAL HIGH (ref 32–45)

## 2022-08-10 LAB — HEMOGLOBIN A1C
Hgb A1c MFr Bld: 5.7 % — ABNORMAL HIGH (ref 4.8–5.6)
Mean Plasma Glucose: 117 mg/dL

## 2022-08-10 LAB — ECHOCARDIOGRAM COMPLETE
Area-P 1/2: 3.77 cm2
Calc EF: 58.8 %
Height: 68 in
S' Lateral: 2.7 cm
Single Plane A2C EF: 54.6 %
Single Plane A4C EF: 61 %
Weight: 2444.46 oz

## 2022-08-10 MED ORDER — LABETALOL HCL 5 MG/ML IV SOLN: 10.0000 mg | INTRAVENOUS | Status: DC | PRN

## 2022-08-10 MED ORDER — DILTIAZEM HCL 25 MG/5ML IV SOLN: 10.0000 mg | INTRAVENOUS | Status: DC | PRN

## 2022-08-10 MED ORDER — DILTIAZEM HCL 30 MG PO TABS
30.0000 mg | ORAL_TABLET | Freq: Four times a day (QID) | ORAL | Status: DC
  Filled 2022-08-10 (×3): qty 1

## 2022-08-10 NOTE — Progress Notes (Signed)
PROGRESS NOTE    Tiffany Black  LNL:[id number] DOB: February 19, 1934 DOA: 08/09/2022 PCP: Ginger Organ., MD    Chief Complaint  Patient presents with   Code Stroke    Brief Narrative:    Tiffany Black is a 88 y.o. female with medical history significant for persistent atrial fibrillation chronically anticoagulated on Xarelto, essential hypertension, generalized anxiety disorder, dementia, who is admitted to [facility] on 08/09/2022 for further TIA/stroke work-up after presenting from home to [facility] ED for evaluation word finding difficulties.  And altered mental status.  Assessment & Plan:   Principal Problem:   Aphasia Active Problems:   Paroxysmal atrial fibrillation with RVR (HCC)   Essential hypertension   Atrial fibrillation with RVR (HCC)   Acute encephalopathy   GAD (generalized anxiety disorder)   TIA (transient ischemic attack)  TIA  -She presents with transient focal deficits. -MRI brain with no evidence of acute CVA -PT/OT/SLP consulted -follow on  Lipid panel, A1c -follow on  echo is done, reading is pending -Continue with Xarelto for now, will await further recommendations  altered mental status/acute encephalopathy -presents with worsening mentation, follow-up on workup for TIA/CVA -He is with dementia at baseline, but mental status currently much worse than baseline  Hypertension -For permissive hypertension, will continue only with metoprolol for heart rate control, hold Diovan and felodipine  Hyperlipidemia -Follow on LDL, and start statin if indicated  Paroxysmal atrial fibrillation -Continue with Xarelto for anticoagulation -Continue with metoprolol for heart rate control, blood pressure remains elevated, so currently she is on Cardizem drip, will continue to taper and wean off as tolerated.   DVT prophylaxis: Xarelto Code Status: (Full) Family Communication: none at bedside Disposition:      Consultants:   Neurology  Subjective:  She reports generalized weakness, fatigue  Objective: Vitals:   08/10/22 0800 08/10/22 0815 08/10/22 1006 08/10/22 1007  BP: (!) 136/114 (!) 139/98 (!) 151/98   Pulse: 82 74 76 83  Resp: '18 20 19 20  '$ Temp:      TempSrc:      SpO2: 98% 100% 100% 97%  Weight:      Height:        Intake/Output Summary (Last 24 hours) at 08/10/2022 1026 Last data filed at 08/10/2022 0255 Gross per 24 hour  Intake 1014.16 ml  Output --  Net 1014.16 ml   Filed Weights   08/09/22 2050  Weight: 69.3 kg    Examination:  Awake Alert, extremely frail, deconditioned. Symmetrical Chest wall movement, Good air movement bilaterally, CTAB Irregular ,No Gallops,Rubs or new Murmurs, No Parasternal Heave +ve B.Sounds, Abd Soft, No tenderness, No rebound - guarding or rigidity. No Cyanosis, Clubbing or edema, No new Rash or bruise       Data Reviewed: I have personally reviewed following labs and imaging studies  CBC: Recent Labs  Lab 08/09/22 1955 08/09/22 2001 08/10/22 0539 08/10/22 0544  WBC 8.4  --  9.0  --   NEUTROABS 5.8  --  8.1*  --   HGB 13.6 13.3 13.4 14.3  HCT 40.2 39.0 40.4 42.0  MCV 94.8  --  95.1  --   PLT 142*  --  149*  --     Basic Metabolic Panel: Recent Labs  Lab 08/09/22 1955 08/09/22 2001 08/10/22 0544  NA 142 141 136  K 3.7 3.5 3.8  CL 106 104  --   CO2 22  --   --   GLUCOSE 97 98  --  BUN 15 17  --   CREATININE 1.36* 1.40*  --   CALCIUM 9.6  --   --     GFR: Estimated Creatinine Clearance: 29.1 mL/min (A) (by C-G formula based on SCr of 1.4 mg/dL (H)).  Liver Function Tests: Recent Labs  Lab 08/09/22 1955  AST 27  ALT 11  ALKPHOS 62  BILITOT 0.7  PROT 6.4*  ALBUMIN 3.9    CBG: Recent Labs  Lab 08/09/22 1957  GLUCAP 104*     Recent Results (from the past 240 hour(s))  Resp Panel by RT-PCR (Flu A&B, Covid) Anterior Nasal Swab     Status: None   Collection Time: 08/09/22  9:00 PM   Specimen: Anterior Nasal  Swab  Result Value Ref Range Status   SARS Coronavirus 2 by RT PCR NEGATIVE NEGATIVE Final    Comment: (NOTE) SARS-CoV-2 target nucleic acids are NOT DETECTED.  The SARS-CoV-2 RNA is generally detectable in upper respiratory specimens during the acute phase of infection. The lowest concentration of SARS-CoV-2 viral copies this assay can detect is 138 copies/mL. A negative result does not preclude SARS-Cov-2 infection and should not be used as the sole basis for treatment or other patient management decisions. A negative result may occur with  improper specimen collection/handling, submission of specimen other than nasopharyngeal swab, presence of viral mutation(s) within the areas targeted by this assay, and inadequate number of viral copies(<138 copies/mL). A negative result must be combined with clinical observations, patient history, and epidemiological information. The expected result is Negative.  Fact Sheet for Patients:  EntrepreneurPulse.com.au  Fact Sheet for Healthcare Providers:  IncredibleEmployment.be  This test is no t yet approved or cleared by the [place] FDA and  has been authorized for detection and/or diagnosis of SARS-CoV-2 by FDA under an Emergency Use Authorization (EUA). This EUA will remain  in effect (meaning this test can be used) for the duration of the COVID-19 declaration under Section 564(b)(1) of the Act, 21 U.S.C.section 360bbb-3(b)(1), unless the authorization is terminated  or revoked sooner.       Influenza A by PCR NEGATIVE NEGATIVE Final   Influenza B by PCR NEGATIVE NEGATIVE Final    Comment: (NOTE) The Xpert Xpress SARS-CoV-2/FLU/RSV plus assay is intended as an aid in the diagnosis of influenza from Nasopharyngeal swab specimens and should not be used as a sole basis for treatment. Nasal washings and aspirates are unacceptable for Xpert Xpress SARS-CoV-2/FLU/RSV testing.  Fact Sheet for  Patients: EntrepreneurPulse.com.au  Fact Sheet for Healthcare Providers: IncredibleEmployment.be  This test is not yet approved or cleared by the [place] FDA and has been authorized for detection and/or diagnosis of SARS-CoV-2 by FDA under an Emergency Use Authorization (EUA). This EUA will remain in effect (meaning this test can be used) for the duration of the COVID-19 declaration under Section 564(b)(1) of the Act, 21 U.S.C. section 360bbb-3(b)(1), unless the authorization is terminated or revoked.  Performed at [facility], [place] [address], [place], [place] [address]          Radiology Studies: [name] CONTRAST  Result Date: 08/10/2022 CLINICAL DATA:  Stroke suspected.  Expressive aphasia. EXAM: MRI HEAD WITHOUT CONTRAST TECHNIQUE: Multiplanar, multiecho pulse sequences of the brain and surrounding structures were obtained without intravenous contrast. COMPARISON:  MRI Brain 05/10/15, CT Head/neck angiogram 08/09/22 FINDINGS: Limitations: Susceptibility weighted imaging was not performed due to patient intolerance. Brain: No acute infarction, hemorrhage, hydrocephalus, extra-axial collection or mass lesion. Sequela of severe chronic  microvascular ischemic change with chronic infarct in the left corona radiata. The degree of chronic microvascular ischemic change has markedly progressed compared to 2016. There is also advanced generalized volume loss, which has also progressed compared to 2016. Volume loss likely has mild frontal and temporal lobe predominance Vascular: Normal flow voids. Skull and upper cervical spine: Normal marrow signal. Sinuses/Orbits: Bilateral lens replacement. Mucosal thickening left maxillary, ethmoid, and frontal sinus. No mastoid effusion. Other: None IMPRESSION: Limitations: Susceptibility weighted imaging was not performed due to patient intolerance. 1. No acute intracranial abnormality. 2. Sequela of severe  chronic microvascular ischemic change with chronic infarct in the left corona radiata, progressed compared to 2016. 3. Advanced generalized volume loss, progressed compared to 2016. Volume loss likely has mild frontal and temporal lobe predominance. Electronically Signed   By: Marin Roberts M.D.   On: 08/10/2022 10:02   DG Chest Portable 1 View  Result Date: 08/09/2022 CLINICAL DATA:  Altered level of consciousness EXAM: PORTABLE CHEST 1 VIEW COMPARISON:  05/30/2022 FINDINGS: Single frontal view of the chest demonstrates an enlarged cardiac silhouette, stable. No acute airspace disease, effusion, or pneumothorax. No acute bony abnormalities. IMPRESSION: 1. Stable chest, no acute process. Electronically Signed   By: Randa Ngo M.D.   On: 08/09/2022 21:15   CT ANGIO HEAD NECK W WO CM (CODE STROKE)  Result Date: 08/09/2022 CLINICAL DATA:  Aphasia EXAM: CT ANGIOGRAPHY HEAD AND NECK TECHNIQUE: Multidetector CT imaging of the head and neck was performed using the standard protocol during bolus administration of intravenous contrast. Multiplanar CT image reconstructions and MIPs were obtained to evaluate the vascular anatomy. Carotid stenosis measurements (when applicable) are obtained utilizing NASCET criteria, using the distal internal carotid diameter as the denominator. RADIATION DOSE REDUCTION: This exam was performed according to the departmental dose-optimization program which includes automated exposure control, adjustment of the mA and/or kV according to patient size and/or use of iterative reconstruction technique. CONTRAST:  28m OMNIPAQUE IOHEXOL 350 MG/ML SOLN COMPARISON:  Head CT same day FINDINGS: CTA NECK FINDINGS SKELETON: There is no bony spinal canal stenosis. No lytic or blastic lesion. OTHER NECK: Normal pharynx, larynx and major salivary glands. No cervical lymphadenopathy. Unremarkable thyroid gland. UPPER CHEST: No pneumothorax or pleural effusion. No nodules or masses. AORTIC ARCH:  There is no calcific atherosclerosis of the aortic arch. There is no aneurysm, dissection or hemodynamically significant stenosis of the visualized portion of the aorta. Conventional 3 vessel aortic branching pattern. The visualized proximal subclavian arteries are widely patent. RIGHT CAROTID SYSTEM: Normal without aneurysm, dissection or stenosis. LEFT CAROTID SYSTEM: Normal without aneurysm, dissection or stenosis. VERTEBRAL ARTERIES: Codominant configuration. Both origins are clearly patent. There is no dissection, occlusion or flow-limiting stenosis to the skull base (V1-V3 segments). CTA HEAD FINDINGS POSTERIOR CIRCULATION: --Vertebral arteries: Normal V4 segments. --Inferior cerebellar arteries: Normal. --Basilar artery: Normal. --Superior cerebellar arteries: Normal. --Posterior cerebral arteries (PCA): Normal. ANTERIOR CIRCULATION: --Intracranial internal carotid arteries: Normal. --Anterior cerebral arteries (ACA): Normal. Both A1 segments are present. Patent anterior communicating artery (a-comm). --Middle cerebral arteries (MCA): Normal. VENOUS SINUSES: As permitted by contrast timing, patent. ANATOMIC VARIANTS: None Review of the MIP images confirms the above findings. IMPRESSION: 1. No emergent large vessel occlusion or high-grade stenosis of the intracranial arteries. 2. Normal CTA of the neck. Electronically Signed   By: KUlyses JarredM.D.   On: 08/09/2022 20:54   CT HEAD CODE STROKE WO CONTRAST  Result Date: 08/09/2022 CLINICAL DATA:  Code stroke.  Acute neurologic deficit EXAM:  CT HEAD WITHOUT CONTRAST TECHNIQUE: Contiguous axial images were obtained from the base of the skull through the vertex without intravenous contrast. RADIATION DOSE REDUCTION: This exam was performed according to the departmental dose-optimization program which includes automated exposure control, adjustment of the mA and/or kV according to patient size and/or use of iterative reconstruction technique. COMPARISON:   07/08/2022 FINDINGS: Brain: There is no mass, hemorrhage or extra-axial collection. There is generalized atrophy without lobar predilection. There is hypoattenuation of the periventricular white matter, most commonly indicating chronic ischemic microangiopathy. Vascular: No abnormal hyperdensity of the major intracranial arteries or dural venous sinuses. No intracranial atherosclerosis. Skull: The visualized skull base, calvarium and extracranial soft tissues are normal. Sinuses/Orbits: No fluid levels or advanced mucosal thickening of the visualized paranasal sinuses. No mastoid or middle ear effusion. The orbits are normal. ASPECTS [facility] Stroke Program Early CT Score) - Ganglionic level infarction (caudate, lentiform nuclei, internal capsule, insula, M1-M3 cortex): 7 - Supraganglionic infarction (M4-M6 cortex): 3 Total score (0-10 with 10 being normal): 10 IMPRESSION: 1. No acute intracranial abnormality. 2. ASPECTS is 10. These results were communicated to Dr. Kerney Elbe at 8:16 pm on 08/09/2022 by text page via the [facility] messaging system. Electronically Signed   By: Ulyses Jarred M.D.   On: 08/09/2022 20:16        Scheduled Meds:   stroke: early stages of recovery book   Does not apply Once   metoprolol tartrate  25 mg Oral BID   Rivaroxaban  15 mg Oral Q supper   Continuous Infusions:  diltiazem (CARDIZEM) infusion 5 mg/hr (08/10/22 0739)     LOS: 0 days       Phillips Climes, MD [facility]   To contact the attending provider between 7A-7P or the covering provider during after hours 7P-7A, please log into the web site [email or web address] and access using universal  password for that web site. If you do not have the password, please call the hospital operator.  08/10/2022, 10:26 AM

## 2022-08-10 NOTE — ED Notes (Signed)
Patient transported to MRI 

## 2022-08-10 NOTE — ED Notes (Signed)
PT pulled out IV and EKG cords. Site cleaned, pt repositioned in bed, gown replaced, mitts applied. NAD noted at this time.

## 2022-08-10 NOTE — Evaluation (Signed)
Occupational Therapy Evaluation Patient Details Name: Tiffany Black MRN: [id number] DOB: 29-May-1934 Today's Date: 08/10/2022   History of Present Illness 88 yo female presents [facility] 11/[facility] with fall, aphasia. CTH negative for acute findings,  Further workup for CVA/TIA pending. Additionally, pt with encephalopathy and afib. PMHx of dementia, cataracts, strokes in 2015 and 2016, HLD, mild carotid atherosclerotic disease by ultrasound, HTN, insomnia and persistent atrial fibrillation (on Xarelto).   Clinical Impression   PTA, pt lives with son, reports typically Independent with ADLs/mobility without AD though family not present to confirm. Pt presents now with deficits in cognition, standing balance, strength, endurance and safety awareness. Pt requires Mod A for bed mobility, min guard to Min A for standing trials with RW. Pt impulsive throughout, standing multiple times though unable to successfully redirect to functional mobility or tasks due to poor attention. As pt at high risk for falls and unsure of available assist at home, rec SNF rehab at DC.     Recommendations for follow up therapy are one component of a multi-disciplinary discharge planning process, led by the attending physician.  Recommendations may be updated based on patient status, additional functional criteria and insurance authorization.   Follow Up Recommendations  Skilled nursing-short term rehab (<3 hours/day)     Assistance Recommended at Discharge Frequent or constant Supervision/Assistance  Patient can return home with the following A little help with walking and/or transfers;A lot of help with bathing/dressing/bathroom;Direct supervision/assist for medications management;Direct supervision/assist for financial management;Assistance with cooking/housework;Assist for transportation;Help with stairs or ramp for entrance    Functional Status Assessment  Patient has had a recent decline in their functional status and  demonstrates the ability to make significant improvements in function in a reasonable and predictable amount of time.  Equipment Recommendations  BSC/3in1    Recommendations for Other Services       Precautions / Restrictions Precautions Precautions: Fall;Other (comment) Precaution Comments: watch HR Restrictions Weight Bearing Restrictions: No      Mobility Bed Mobility Overal bed mobility: Needs Assistance Bed Mobility: Supine to Sit, Sit to Supine     Supine to sit: Mod assist, HOB elevated Sit to supine: Mod assist   General bed mobility comments: assist for trunk and LE management, posterior lean requiring support to correct and scoot forward. Mod A to get BLE back into bed. pt with poor initiation so OT initiated the movement    Transfers Overall transfer level: Needs assistance Equipment used: Rolling walker (2 wheels) Transfers: Sit to/from Stand Sit to Stand: Min assist, Min guard           General transfer comment: standing impulsively multiple times, RW appears to be familiar to pt and assisted in balance. required min A to min guard for foot blocking, bringing hand to RW handle at times. unable to successfully engage in side steps due to poor command following      Balance Overall balance assessment: Needs assistance, History of Falls Sitting-balance support: Bilateral upper extremity supported, Feet supported Sitting balance-Leahy Scale: Poor   Postural control: Posterior lean Standing balance support: During functional activity, Bilateral upper extremity supported Standing balance-Leahy Scale: Poor                             ADL either performed or assessed with clinical judgement   ADL Overall ADL's : Needs assistance/impaired Eating/Feeding: Bed level;Minimal assistance Eating/Feeding Details (indicate cue type and reason): coughing with sipping water in  room, RN aware Grooming: Minimal assistance;Bed level   Upper Body Bathing:  Maximal assistance;Bed level;Sitting   Lower Body Bathing: Maximal assistance;Sitting/lateral leans;Sit to/from stand   Upper Body Dressing : Maximal assistance;Sitting;Bed level   Lower Body Dressing: Maximal assistance;Sitting/lateral leans;Sit to/from stand       Toileting- Water quality scientist and Hygiene: Total assistance;Bed level         General ADL Comments: pt limited by cognitive deficits, impulsive throughout and difficult to engage/sustain attention to tasks safely     Vision Ability to See in Adequate Light: 0 Adequate Patient Visual Report: No change from baseline Vision Assessment?: No apparent visual deficits     Perception     Praxis      Pertinent Vitals/Pain Pain Assessment Pain Assessment: No/denies pain     Hand Dominance Right   Extremity/Trunk Assessment Upper Extremity Assessment Upper Extremity Assessment: Generalized weakness   Lower Extremity Assessment Lower Extremity Assessment: Defer to PT evaluation   Cervical / Trunk Assessment Cervical / Trunk Assessment: Kyphotic   Communication Communication Communication: No difficulties   Cognition Arousal/Alertness: Awake/alert Behavior During Therapy: Restless, Anxious, Impulsive Overall Cognitive Status: History of cognitive impairments - at baseline                                 General Comments: pt expressing fear of falling multiple times, A&Ox1, reports unaware of where she is and how long she has been here. poor command following more due to impulsivity and attention deficits.     General Comments  HR up to 128bpm a fib    Exercises     Shoulder Instructions      Home Living Family/patient expects to be discharged to:: Private residence Living Arrangements: Children Available Help at Discharge: Family Type of Home: House Home Access: Level entry     Home Layout: One level     Bathroom Shower/Tub: Teacher, early years/pre: Standard      Home Equipment: Conservation officer, nature (2 wheels);Cane - single point   Additional Comments: son lives with her but works at [employer] during the day per pt. PT had attempted to call family with no answer      Prior Functioning/Environment Prior Level of Function : History of Falls (last six months);Patient poor historian/Family not available             Mobility Comments: reports no use of AD though fearful of falling ADLs Comments: reports able to complete ADLs though unsure of baseline due to poor historian        OT Problem List: Decreased strength;Decreased activity tolerance;Impaired balance (sitting and/or standing);Decreased cognition;Decreased safety awareness;Decreased knowledge of use of DME or AE      OT Treatment/Interventions: Self-care/ADL training;Therapeutic exercise;Energy conservation;DME and/or AE instruction;Therapeutic activities;Patient/family education    OT Goals(Current goals can be found in the care plan section) Acute Rehab OT Goals Patient Stated Goal: avoid falls OT Goal Formulation: With patient Time For Goal Achievement: 08/24/22 Potential to Achieve Goals: Good  OT Frequency: Min 2X/week    Co-evaluation              AM-PAC OT "6 Clicks" Daily Activity     Outcome Measure Help from another person eating meals?: A Little Help from another person taking care of personal grooming?: A Little Help from another person toileting, which includes using toliet, bedpan, or urinal?: Total Help from another person bathing (including washing, rinsing,  drying)?: A Lot Help from another person to put on and taking off regular upper body clothing?: A Lot Help from another person to put on and taking off regular lower body clothing?: A Lot 6 Click Score: 13   End of Session Equipment Utilized During Treatment: Rolling walker (2 wheels) Nurse Communication: Mobility status  Activity Tolerance: Other (comment) (limited by cognition) Patient left: in  bed;with call bell/phone within reach  OT Visit Diagnosis: Unsteadiness on feet (R26.81);Other abnormalities of gait and mobility (R26.89);Other symptoms and signs involving cognitive function                Time: 1040-1054 OT Time Calculation (min): 14 min Charges:  OT General Charges $OT Visit: 1 Visit OT Evaluation $OT Eval Moderate Complexity: 1 Mod  Malachy Chamber, OTR/L Acute Rehab Services Office: [phone]   Layla Maw 08/10/2022, 11:05 AM

## 2022-08-10 NOTE — Evaluation (Signed)
Physical Therapy Evaluation Patient Details Name: Tiffany Black MRN: [id number] DOB: [birth year] Today's Date: 08/10/2022  History of Present Illness  88 yo female presents to [facility] on 11/29 with fall, aphasia. CTH negative for acute findings,  Further workup for CVA/TIA pending. Additionally, pt with encephalopathy and afib. PMHx of dementia, cataracts, strokes in 2015 and 2016, HLD, mild carotid atherosclerotic disease by ultrasound, HTN, insomnia and persistent atrial fibrillation (on Xarelto).  Clinical Impression   Pt presents with generalized weakness, impaired balance with recent history of falls, poor activity tolerance, and altered mental status although unsure of baseline mentation given history of dementia. Pt to benefit from acute PT to address deficits. Pt overall requiring mod-max +1 assist for transfer-level mobility at this time, unable to initiate gait training given pt weakness, fatigue, and anxiety. Pt claims she is from home with son, unsure of true baseline PLOF as PT unable to reach son via telephone when attempted. PT recommending SNF level of care post-acutely. PT to progress mobility as tolerated, and will continue to follow acutely.         Recommendations for follow up therapy are one component of a multi-disciplinary discharge planning process, led by the attending physician.  Recommendations may be updated based on patient status, additional functional criteria and insurance authorization.  Follow Up Recommendations Skilled nursing-short term rehab (<3 hours/day) Can patient physically be transported by private vehicle: No    Assistance Recommended at Discharge Frequent or constant Supervision/Assistance  Patient can return home with the following  A lot of help with walking and/or transfers;A lot of help with bathing/dressing/bathroom;Assistance with cooking/housework;Direct supervision/assist for medications management;Direct supervision/assist for financial  management;Assist for transportation;Help with stairs or ramp for entrance    Equipment Recommendations None recommended by PT  Recommendations for Other Services       Functional Status Assessment Patient has had a recent decline in their functional status and demonstrates the ability to make significant improvements in function in a reasonable and predictable amount of time.     Precautions / Restrictions Precautions Precautions: Fall Restrictions Weight Bearing Restrictions: No      Mobility  Bed Mobility Overal bed mobility: Needs Assistance Bed Mobility: Supine to Sit, Sit to Supine     Supine to sit: Max assist Sit to supine: Max assist   General bed mobility comments: assist for trunk and LE management, boost up in bed upon pt return to supine. Pt reporting dizziness upon initial sitting, quickly passed    Transfers Overall transfer level: Needs assistance Equipment used: 1 person hand held assist Transfers: Sit to/from Stand Sit to Stand: Mod assist, From elevated surface           General transfer comment: mod assist for rise and steady, PT encouraged lateral stepping at EOB but pt quickly sits when center of mass shifts. STS x3 from EOB    Ambulation/Gait               General Gait Details: unable  Stairs            Wheelchair Mobility    Modified Rankin (Stroke Patients Only)       Balance Overall balance assessment: Needs assistance, History of Falls Sitting-balance support: Bilateral upper extremity supported, Feet supported Sitting balance-Leahy Scale: Poor Sitting balance - Comments: periods of unsupported sitting with pt using bilat UEs to prop, requires at least min truncal assist when she leans posteriorly from fatigue Postural control: Posterior lean Standing balance support: Single extremity supported, During  functional activity Standing balance-Leahy Scale: Poor                               Pertinent  Vitals/Pain Pain Assessment Pain Assessment: Faces Faces Pain Scale: Hurts a little bit Pain Location: generalized guarding Pain Descriptors / Indicators: Guarding Pain Intervention(s): Limited activity within patient's tolerance, Monitored during session, Repositioned    Home Living Family/patient expects to be discharged to:: Private residence Living Arrangements: Children (son) Available Help at Discharge: Family Type of Home: House Home Access: Level entry       Home Layout: One level Home Equipment: Conservation officer, nature (2 wheels);Cane - single point      Prior Function Prior Level of Function : History of Falls (last six months);Patient poor historian/Family not available             Mobility Comments: Some information obtained from chart, pt is a poor historian. Pt insists she lives in her home, and that son lives with her but works at [employer] during the day. PT called pt's son via number in chart, no answer ADLs Comments: Unsure of pt baseline     Hand Dominance   Dominant Hand: Right    Extremity/Trunk Assessment   Upper Extremity Assessment Upper Extremity Assessment: Defer to OT evaluation    Lower Extremity Assessment Lower Extremity Assessment: Generalized weakness    Cervical / Trunk Assessment Cervical / Trunk Assessment: Kyphotic  Communication   Communication: No difficulties  Cognition Arousal/Alertness: Awake/alert Behavior During Therapy: Restless, Anxious Overall Cognitive Status: History of cognitive impairments - at baseline                                 General Comments: pt expressing fear of falling multiple times, difficulty following commands so multimodal, repeated cuing utilized        General Comments General comments (skin integrity, edema, etc.): HR in afib rhythm    Exercises     Assessment/Plan    PT Assessment Patient needs continued PT services  PT Problem List Decreased strength;Decreased  mobility;Decreased balance;Decreased knowledge of use of DME;Pain;Decreased activity tolerance;Decreased cognition;Decreased safety awareness       PT Treatment Interventions DME instruction;Therapeutic activities;Gait training;Therapeutic exercise;Patient/family education;Balance training;Functional mobility training;Neuromuscular re-education    PT Goals (Current goals can be found in the Care Plan section)  Acute Rehab PT Goals PT Goal Formulation: With patient Time For Goal Achievement: 08/24/22 Potential to Achieve Goals: Good    Frequency Min 2X/week     Co-evaluation               AM-PAC PT "6 Clicks" Mobility  Outcome Measure Help needed turning from your back to your side while in a flat bed without using bedrails?: A Lot Help needed moving from lying on your back to sitting on the side of a flat bed without using bedrails?: A Lot Help needed moving to and from a bed to a chair (including a wheelchair)?: A Lot Help needed standing up from a chair using your arms (e.g., wheelchair or bedside chair)?: A Lot Help needed to walk in hospital room?: Total Help needed climbing 3-5 steps with a railing? : Total 6 Click Score: 10    End of Session   Activity Tolerance: Patient tolerated treatment well;Patient limited by fatigue Patient left: in bed;with call bell/phone within reach;Other (comment) (on stretcher in ED, rails up  and door open) Nurse Communication: Mobility status PT Visit Diagnosis: Other abnormalities of gait and mobility (R26.89);Muscle weakness (generalized) (M62.81);History of falling (Z91.81)    Time: 3317-4099 PT Time Calculation (min) (ACUTE ONLY): 19 min   Charges:   PT Evaluation $PT Eval Low Complexity: 1 Low        Genavie Boettger S, PT DPT Acute Rehabilitation Services Pager [phone]  Office [phone]   Lowell E Ruffin Pyo 08/10/2022, 9:50 AM

## 2022-08-10 NOTE — ED Notes (Signed)
Patient is currently alert , non-verbal will follow commands.

## 2022-08-10 NOTE — Progress Notes (Addendum)
STROKE TEAM PROGRESS NOTE   INTERVAL HISTORY No family at the bedside. She is oriented to self and knows she is at the hospital, but is not able to tell me why she is in the hospital or the year.  Already on xarelto for afib. MRI negative for acute infarct. On cardizem for rate control   Vitals:   08/10/22 1007 08/10/22 1015 08/10/22 1100 08/10/22 1315  BP:  (!) 150/70 (!) 146/87 (!) 151/103  Pulse: 83 72 90 87  Resp: 20 (!) 21 (!) 24 16  Temp:   98.5 F (36.9 C)   TempSrc:   Oral   SpO2: 97% 97% 99% 98%  Weight:      Height:       CBC:  Recent Labs  Lab 08/09/22 1955 08/09/22 2001 08/10/22 0539 08/10/22 0544  WBC 8.4  --  9.0  --   NEUTROABS 5.8  --  8.1*  --   HGB 13.6   < > 13.4 14.3  HCT 40.2   < > 40.4 42.0  MCV 94.8  --  95.1  --   PLT 142*  --  149*  --    < > = values in this interval not displayed.   Basic Metabolic Panel:  Recent Labs  Lab 08/09/22 1955 08/09/22 2001 08/10/22 0544  NA 142 141 136  K 3.7 3.5 3.8  CL 106 104  --   CO2 22  --   --   GLUCOSE 97 98  --   BUN 15 17  --   CREATININE 1.36* 1.40*  --   CALCIUM 9.6  --   --    Lipid Panel: No results for input(s): "CHOL", "TRIG", "HDL", "CHOLHDL", "VLDL", "LDLCALC" in the last 168 hours. HgbA1c: No results for input(s): "HGBA1C" in the last 168 hours. Urine Drug Screen: No results for input(s): "LABOPIA", "COCAINSCRNUR", "LABBENZ", "AMPHETMU", "THCU", "LABBARB" in the last 168 hours.  Alcohol Level  Recent Labs  Lab 08/09/22 1955  ETH <10    IMAGING past 24 hours ECHOCARDIOGRAM COMPLETE  Result Date: 08/10/2022    ECHOCARDIOGRAM REPORT   Patient Name:   SIANI UTKE Date of Exam: 08/10/2022 Medical Rec #:  [id number]     Height:       68.0 in Accession #:    [id number]    Weight:       152.8 lb Date of Birth:  1934/06/23     BSA:          1.823 m Patient Age:    88 years      BP:           146/87 mmHg Patient Gender: F             HR:           68 bpm. Exam Location:  Inpatient Procedure:  2D Echo Indications:     TIA  History:         Patient has prior history of Echocardiogram examinations, most                  recent 04/10/2017. Arrythmias:Atrial Fibrillation; Risk                  Factors:Hypertension.  Sonographer:     Harvie Junior Referring Phys:  [id number] Rhetta Mura Diagnosing Phys: [facility]  Sonographer Comments: Technically difficult study due to poor echo windows. Patient confused and trying to get up. IMPRESSIONS  1. Left ventricular ejection fraction,  by estimation, is 60 to 65%. The left ventricle has normal function. The left ventricle has no regional wall motion abnormalities. There is mild left ventricular hypertrophy. Indeterminate diastolic filling due to E-A fusion.  2. Right ventricular systolic function is normal. The right ventricular size is normal. There is normal pulmonary artery systolic pressure. The estimated right ventricular systolic pressure is 29.9 mmHg.  3. No evidence of mitral valve regurgitation.  4. Aortic valve regurgitation is not visualized. Aortic valve sclerosis/calcification is present, without any evidence of aortic stenosis.  5. The inferior vena cava is normal in size with greater than 50% respiratory variability, suggesting right atrial pressure of 3 mmHg. Comparison(s): No prior Echocardiogram. FINDINGS  Left Ventricle: Left ventricular ejection fraction, by estimation, is 60 to 65%. The left ventricle has normal function. The left ventricle has no regional wall motion abnormalities. The left ventricular internal cavity size was normal in size. There is  mild left ventricular hypertrophy. Indeterminate diastolic filling due to E-A fusion. Right Ventricle: The right ventricular size is normal. Right ventricular systolic function is normal. There is normal pulmonary artery systolic pressure. The tricuspid regurgitant velocity is 2.84 m/s, and with an assumed right atrial pressure of 3 mmHg,  the estimated right ventricular systolic pressure is  37.1 mmHg. Left Atrium: Left atrial size was normal in size. Right Atrium: Right atrial size was normal in size. Pericardium: There is no evidence of pericardial effusion. Mitral Valve: Mild mitral annular calcification. No evidence of mitral valve regurgitation. Tricuspid Valve: Tricuspid valve regurgitation is trivial. Aortic Valve: Aortic valve regurgitation is not visualized. Aortic valve sclerosis/calcification is present, without any evidence of aortic stenosis. Pulmonic Valve: Pulmonic valve regurgitation is not visualized. Aorta: The aortic root and ascending aorta are structurally normal, with no evidence of dilitation. Venous: The inferior vena cava is normal in size with greater than 50% respiratory variability, suggesting right atrial pressure of 3 mmHg. IAS/Shunts: The interatrial septum was not well visualized.  LEFT VENTRICLE PLAX 2D LVIDd:         3.50 cm     Diastology LVIDs:         2.70 cm     LV e' medial:    5.87 cm/s LV PW:         1.20 cm     LV E/e' medial:  13.5 LV IVS:        1.20 cm     LV e' lateral:   12.10 cm/s LVOT diam:     1.90 cm     LV E/e' lateral: 6.6 LV SV:         40 LV SV Index:   22 LVOT Area:     2.84 cm  LV Volumes (MOD) LV vol d, MOD A2C: 54.9 ml LV vol d, MOD A4C: 90.6 ml LV vol s, MOD A2C: 24.9 ml LV vol s, MOD A4C: 35.3 ml LV SV MOD A2C:     30.0 ml LV SV MOD A4C:     90.6 ml LV SV MOD BP:      46.0 ml RIGHT VENTRICLE RV Basal diam:  2.90 cm RV Mid diam:    2.20 cm RV S prime:     10.30 cm/s TAPSE (M-mode): 1.3 cm LEFT ATRIUM             Index        RIGHT ATRIUM           Index LA diam:  3.80 cm 2.08 cm/m   RA Area:     17.90 cm LA Vol (A2C):   43.9 ml 24.09 ml/m  RA Volume:   47.00 ml  25.79 ml/m LA Vol (A4C):   55.9 ml 30.67 ml/m LA Biplane Vol: 54.1 ml 29.68 ml/m  AORTIC VALVE             PULMONIC VALVE LVOT Vmax:   78.50 cm/s  PV Vmax:       0.93 m/s LVOT Vmean:  50.700 cm/s PV Peak grad:  3.4 mmHg LVOT VTI:    0.140 m  AORTA Ao Root diam: 3.30 cm Ao  Asc diam:  3.00 cm MITRAL VALVE               TRICUSPID VALVE MV Area (PHT): 3.77 cm    TR Peak grad:   32.3 mmHg MV Decel Time: 201 msec    TR Vmax:        284.00 cm/s MV E velocity: 79.50 cm/s MV A velocity: 26.00 cm/s  SHUNTS MV E/A ratio:  3.06        Systemic VTI:  0.14 m                            Systemic Diam: 1.90 cm Phineas Inches Electronically signed by Phineas Inches Signature Date/Time: 08/10/2022/12:59:48 PM    Final (Updated)    [name] CONTRAST  Result Date: 08/10/2022 CLINICAL DATA:  Stroke suspected.  Expressive aphasia. EXAM: MRI HEAD WITHOUT CONTRAST TECHNIQUE: Multiplanar, multiecho pulse sequences of the brain and surrounding structures were obtained without intravenous contrast. COMPARISON:  MRI Brain 05/10/15, CT Head/neck angiogram 08/09/22 FINDINGS: Limitations: Susceptibility weighted imaging was not performed due to patient intolerance. Brain: No acute infarction, hemorrhage, hydrocephalus, extra-axial collection or mass lesion. Sequela of severe chronic microvascular ischemic change with chronic infarct in the left corona radiata. The degree of chronic microvascular ischemic change has markedly progressed compared to 2016. There is also advanced generalized volume loss, which has also progressed compared to 2016. Volume loss likely has mild frontal and temporal lobe predominance Vascular: Normal flow voids. Skull and upper cervical spine: Normal marrow signal. Sinuses/Orbits: Bilateral lens replacement. Mucosal thickening left maxillary, ethmoid, and frontal sinus. No mastoid effusion. Other: None IMPRESSION: Limitations: Susceptibility weighted imaging was not performed due to patient intolerance. 1. No acute intracranial abnormality. 2. Sequela of severe chronic microvascular ischemic change with chronic infarct in the left corona radiata, progressed compared to 2016. 3. Advanced generalized volume loss, progressed compared to 2016. Volume loss likely has mild frontal and temporal  lobe predominance. Electronically Signed   By: Marin Roberts M.D.   On: 08/10/2022 10:02   DG Chest Portable 1 View  Result Date: 08/09/2022 CLINICAL DATA:  Altered level of consciousness EXAM: PORTABLE CHEST 1 VIEW COMPARISON:  05/30/2022 FINDINGS: Single frontal view of the chest demonstrates an enlarged cardiac silhouette, stable. No acute airspace disease, effusion, or pneumothorax. No acute bony abnormalities. IMPRESSION: 1. Stable chest, no acute process. Electronically Signed   By: Randa Ngo M.D.   On: 08/09/2022 21:15   CT ANGIO HEAD NECK W WO CM (CODE STROKE)  Result Date: 08/09/2022 CLINICAL DATA:  Aphasia EXAM: CT ANGIOGRAPHY HEAD AND NECK TECHNIQUE: Multidetector CT imaging of the head and neck was performed using the standard protocol during bolus administration of intravenous contrast. Multiplanar CT image reconstructions and MIPs were obtained to evaluate the vascular anatomy. Carotid stenosis measurements (  when applicable) are obtained utilizing NASCET criteria, using the distal internal carotid diameter as the denominator. RADIATION DOSE REDUCTION: This exam was performed according to the departmental dose-optimization program which includes automated exposure control, adjustment of the mA and/or kV according to patient size and/or use of iterative reconstruction technique. CONTRAST:  65m OMNIPAQUE IOHEXOL 350 MG/ML SOLN COMPARISON:  Head CT same day FINDINGS: CTA NECK FINDINGS SKELETON: There is no bony spinal canal stenosis. No lytic or blastic lesion. OTHER NECK: Normal pharynx, larynx and major salivary glands. No cervical lymphadenopathy. Unremarkable thyroid gland. UPPER CHEST: No pneumothorax or pleural effusion. No nodules or masses. AORTIC ARCH: There is no calcific atherosclerosis of the aortic arch. There is no aneurysm, dissection or hemodynamically significant stenosis of the visualized portion of the aorta. Conventional 3 vessel aortic branching pattern. The visualized  proximal subclavian arteries are widely patent. RIGHT CAROTID SYSTEM: Normal without aneurysm, dissection or stenosis. LEFT CAROTID SYSTEM: Normal without aneurysm, dissection or stenosis. VERTEBRAL ARTERIES: Codominant configuration. Both origins are clearly patent. There is no dissection, occlusion or flow-limiting stenosis to the skull base (V1-V3 segments). CTA HEAD FINDINGS POSTERIOR CIRCULATION: --Vertebral arteries: Normal V4 segments. --Inferior cerebellar arteries: Normal. --Basilar artery: Normal. --Superior cerebellar arteries: Normal. --Posterior cerebral arteries (PCA): Normal. ANTERIOR CIRCULATION: --Intracranial internal carotid arteries: Normal. --Anterior cerebral arteries (ACA): Normal. Both A1 segments are present. Patent anterior communicating artery (a-comm). --Middle cerebral arteries (MCA): Normal. VENOUS SINUSES: As permitted by contrast timing, patent. ANATOMIC VARIANTS: None Review of the MIP images confirms the above findings. IMPRESSION: 1. No emergent large vessel occlusion or high-grade stenosis of the intracranial arteries. 2. Normal CTA of the neck. Electronically Signed   By: KUlyses JarredM.D.   On: 08/09/2022 20:54   CT HEAD CODE STROKE WO CONTRAST  Result Date: 08/09/2022 CLINICAL DATA:  Code stroke.  Acute neurologic deficit EXAM: CT HEAD WITHOUT CONTRAST TECHNIQUE: Contiguous axial images were obtained from the base of the skull through the vertex without intravenous contrast. RADIATION DOSE REDUCTION: This exam was performed according to the departmental dose-optimization program which includes automated exposure control, adjustment of the mA and/or kV according to patient size and/or use of iterative reconstruction technique. COMPARISON:  07/08/2022 FINDINGS: Brain: There is no mass, hemorrhage or extra-axial collection. There is generalized atrophy without lobar predilection. There is hypoattenuation of the periventricular white matter, most commonly indicating chronic  ischemic microangiopathy. Vascular: No abnormal hyperdensity of the major intracranial arteries or dural venous sinuses. No intracranial atherosclerosis. Skull: The visualized skull base, calvarium and extracranial soft tissues are normal. Sinuses/Orbits: No fluid levels or advanced mucosal thickening of the visualized paranasal sinuses. No mastoid or middle ear effusion. The orbits are normal. ASPECTS ([facility]) - Ganglionic level infarction (caudate, lentiform nuclei, internal capsule, insula, M1-M3 cortex): 7 - Supraganglionic infarction (M4-M6 cortex): 3 Total score (0-10 with 10 being normal): 10 IMPRESSION: 1. No acute intracranial abnormality. 2. ASPECTS is 10. These results were communicated to Dr. EKerney Elbeat 8:16 pm on 08/09/2022 by text page via the [facility]messaging system. Electronically Signed   By: KUlyses JarredM.D.   On: 08/09/2022 20:16    PHYSICAL EXAM Physical Exam  HEENT-  [place]/AT   Lungs- Respirations unlabored Extremities- No edema   Neurological Examination Mental Status: Awake and alert.No agitation, States name, but does not know where she is or why she is in the hospital. Unable to tell me the date.  Able to follow commands and identify objects consistent.  Pleasantly confused.  Cranial Nerves: II: Blinks to threat in temporal visual fields OU. No extinction to DSS. PERRL. Tracks visual stimuli. Makes eye contact.   III,IV, VI: No ptosis. EOMI. No nystagmus.  V: Reacts to touch bilaterally  VII: Smile symmetric VIII: Hearing intact to voice IX,X: No hypophonia or hoarseness XI: Symmetric XII: Midline tongue extension Motor: Right :  Upper extremity   5/5                                      Left:     Upper extremity   5/5             Lower extremity   5/5                                                  Lower extremity   5/5 No pronator drift Sensory: Reacts to touch x 4. No extinction to DSS.  Cerebellar: No ataxia with FNF  bilaterally Gait: Unable to assess  ASSESSMENT/PLAN Ms. Savaya Hakes is a 88 y.o. female with history of dementia, cataracts, strokes in 2015 and 2016, HLD, mild carotid atherosclerotic disease by ultrasound, HTN, insomnia and persistent atrial fibrillation (on Xarelto) who presents to the ED via EMS as a Code Stroke after she was noted to have become acutely aphasic during an EMS call for a fall she had sustained a few minutes earlier. Per ED notes she has had 3 falls this week. She was in afib with RVR on arrival to ED and started on a cardizem infusion.  More likely encephalopathy than TIA - need to rule out seizure  Code Stroke CT head No acute abnormality. ASPECTS 10.    CTA head & neck NO LVO or high grade stenosis MRI  - No acute intracranial abnormality. chronic infarct in the left corona radiata. Volume loss likely has mild frontal and temporal lobe predominance. 2D Echo EF 60-65% LDL pending HgbA1c pending VTE prophylaxis - Xeralto Xarelto (rivaroxaban) daily prior to admission, now resume xarelto as there is no stroke on MRI Therapy recommendations:  SNF Disposition:  pending  Encephalopathy with baseline dementia Altered mental status No leukocytosis or fever Trace leukocytes and mod hgb in urine EEG pending  Atrial fibrillation Home meds: rate control with lopressor and anticoagulation with xarelto RVR on admission Cardizem drip -> now off -> po cardizem Lopressor resumed  History of stroke 04/2015 admitted for dizziness.  MRI showed tiny right temporal infarct, likely incidental finding.  MRI showed occluded right P1, atherosclerosis left PCA and left MCA.  Carotid Doppler negative.  LDL 140, A1c 5.8.  EF 55 to 60%.  Discharged on Xarelto and pravastatin 40.  Hypertension Home meds:  metoprolol, valsartan/diovan Stable Avoid low BP Long-term BP goal normotensive  Hyperlipidemia Home meds:  None LDL pending, goal < 70 Statin intolerance listed in chart  Other  Stroke Risk Factors Advanced Age >/= 36   Other Active Problems Chronic vertigo  Hospital day # 0  Patient seen and examined by NP/APP with MD. MD to update note as needed.   Janine Ores, DNP, FNP-BC [facility] Pager: [phone]  ATTENDING NOTE: I reviewed above note and agree with the assessment and plan. Pt was seen and examined.   89 year old  female with history of hypertension, hyperlipidemia, A-fib on Xarelto, dementia, chronic vertigo, and a stroke in 04/2015 admitted for fell at home and then become aphasic on EMS evaluation.  The ER, patient still has some word finding difficulty, not follow simple commands.  CT no acute abnormality.  CTA neck unremarkable.  MRI negative for acute infarct.  EF 60 to 65%.  LDL and A1c pending.  Creatinine 1.40.  On my exam, patient sitting in bed for lunch, she is pleasant, smiling, fluent language, following all simple commands, no aphasia, naming 3/4 and able to repeat.  However not fully orientated, only orientated to place and self, not orientated to age or time.  Cranial nerves intact, no focal deficit moving all extremities sensation and coordination intact.  Patient symptoms concerning for encephalopathy in the setting of dementia and a fall.  MRI negative.  Will do EEG to rule out seizure.  Compliant with Xarelto per son.  We will continue Xarelto at this time.  We will follow  For detailed assessment and plan, please refer to above/below as I have made changes wherever appropriate.   Rosalin Hawking, MD PhD Stroke Neurology 08/10/2022 6:22 PM    To contact Stroke Continuity provider, please refer to [email or web address] After hours, contact General Neurology

## 2022-08-10 NOTE — Progress Notes (Signed)
  Echocardiogram 2D Echocardiogram has been performed.  Lana Fish 08/10/2022, 12:25 PM

## 2022-08-10 NOTE — ED Notes (Signed)
Patient pulled out her purewick  was incont of urine linens changed and patient cleaned.

## 2022-08-10 NOTE — Progress Notes (Addendum)
Patient is agitated, moving about, not able to do EEG at this time.  Will try back.

## 2022-08-11 ENCOUNTER — Inpatient Hospital Stay (HOSPITAL_COMMUNITY): Payer: Medicare Other

## 2022-08-11 DIAGNOSIS — I10 Essential (primary) hypertension: Secondary | ICD-10-CM | POA: Diagnosis not present

## 2022-08-11 DIAGNOSIS — F03918 Unspecified dementia, unspecified severity, with other behavioral disturbance: Secondary | ICD-10-CM

## 2022-08-11 DIAGNOSIS — G9340 Encephalopathy, unspecified: Secondary | ICD-10-CM | POA: Diagnosis not present

## 2022-08-11 DIAGNOSIS — R4182 Altered mental status, unspecified: Secondary | ICD-10-CM

## 2022-08-11 DIAGNOSIS — I4891 Unspecified atrial fibrillation: Secondary | ICD-10-CM | POA: Diagnosis not present

## 2022-08-11 DIAGNOSIS — R4701 Aphasia: Secondary | ICD-10-CM | POA: Diagnosis not present

## 2022-08-11 LAB — COMPREHENSIVE METABOLIC PANEL
ALT: 18 U/L (ref 0–44)
AST: 49 U/L — ABNORMAL HIGH (ref 15–41)
Albumin: 3.6 g/dL (ref 3.5–5.0)
Alkaline Phosphatase: 54 U/L (ref 38–126)
Anion gap: 9 (ref 5–15)
BUN: 24 mg/dL — ABNORMAL HIGH (ref 8–23)
CO2: 26 mmol/L (ref 22–32)
Calcium: 9.7 mg/dL (ref 8.9–10.3)
Chloride: 104 mmol/L (ref 98–111)
Creatinine, Ser: 1.55 mg/dL — ABNORMAL HIGH (ref 0.44–1.00)
GFR, Estimated: 32 mL/min — ABNORMAL LOW (ref >60)
Glucose, Bld: 98 mg/dL (ref 70–99)
Potassium: 3.2 mmol/L — ABNORMAL LOW (ref 3.5–5.1)
Sodium: 139 mmol/L (ref 135–145)
Total Bilirubin: 1 mg/dL (ref 0.3–1.2)
Total Protein: 6.1 g/dL — ABNORMAL LOW (ref 6.5–8.1)

## 2022-08-11 LAB — TSH: TSH: 0.857 u[IU]/mL (ref 0.350–4.500)

## 2022-08-11 LAB — LIPID PANEL
Cholesterol: 189 mg/dL (ref 0–200)
HDL: 33 mg/dL — ABNORMAL LOW (ref >40)
LDL Cholesterol: 139 mg/dL — ABNORMAL HIGH (ref 0–99)
Total CHOL/HDL Ratio: 5.7 RATIO
Triglycerides: 83 mg/dL (ref <150)
VLDL: 17 mg/dL (ref 0–40)

## 2022-08-11 LAB — MAGNESIUM: Magnesium: 2 mg/dL (ref 1.7–2.4)

## 2022-08-11 LAB — CK: Total CK: 893 U/L — ABNORMAL HIGH (ref 38–234)

## 2022-08-11 MED ORDER — POTASSIUM CHLORIDE CRYS ER 20 MEQ PO TBCR
40.0000 meq | EXTENDED_RELEASE_TABLET | Freq: Four times a day (QID) | ORAL | Status: AC
  Administered 2022-08-11 (×2): 40 meq via ORAL
  Filled 2022-08-11 (×2): qty 2

## 2022-08-11 MED ORDER — EZETIMIBE 10 MG PO TABS
10.0000 mg | ORAL_TABLET | Freq: Every day | ORAL | Status: DC
  Administered 2022-08-11 – 2022-08-15 (×5): 10 mg via ORAL
  Filled 2022-08-11 (×5): qty 1

## 2022-08-11 MED ORDER — IRBESARTAN 150 MG PO TABS
150.0000 mg | ORAL_TABLET | Freq: Every day | ORAL | Status: DC
  Administered 2022-08-11: 150 mg via ORAL
  Filled 2022-08-11: qty 1

## 2022-08-11 MED ORDER — METOPROLOL TARTRATE 5 MG/5ML IV SOLN
5.0000 mg | Freq: Four times a day (QID) | INTRAVENOUS | Status: DC | PRN
  Administered 2022-08-11 – 2022-08-13 (×3): 5 mg via INTRAVENOUS
  Filled 2022-08-11 (×3): qty 5

## 2022-08-11 MED ORDER — FELODIPINE ER 5 MG PO TB24
5.0000 mg | ORAL_TABLET | Freq: Every day | ORAL | Status: DC
  Administered 2022-08-11: 5 mg via ORAL
  Filled 2022-08-11 (×2): qty 1

## 2022-08-11 MED ORDER — POTASSIUM CHLORIDE 10 MEQ/100ML IV SOLN
10.0000 meq | INTRAVENOUS | Status: DC
  Administered 2022-08-11: 10 meq via INTRAVENOUS
  Filled 2022-08-11: qty 100

## 2022-08-11 NOTE — Progress Notes (Signed)
STROKE TEAM PROGRESS NOTE   INTERVAL HISTORY No family at the bedside. She sitting in bed, with bilateral wrist restrain. She had some agitation and delirium overnight. On rounding, pt fluent speech but avoid answering orientation questions. Moving all extremities.   Vitals:   08/11/22 0743 08/11/22 1250 08/11/22 1326 08/11/22 1553  BP: (!) 197/128 (!) 196/115 126/77 (!) 140/97  Pulse: 61 88 69 88  Resp: '19 16  19  '$ Temp: 97.8 F (36.6 C) 98.2 F (36.8 C)  98.4 F (36.9 C)  TempSrc: Axillary Oral  Oral  SpO2: 98% 97%  98%  Weight:      Height:       CBC:  Recent Labs  Lab 08/09/22 1955 08/09/22 2001 08/10/22 0539 08/10/22 0544  WBC 8.4  --  9.0  --   NEUTROABS 5.8  --  8.1*  --   HGB 13.6   < > 13.4 14.3  HCT 40.2   < > 40.4 42.0  MCV 94.8  --  95.1  --   PLT 142*  --  149*  --    < > = values in this interval not displayed.   Basic Metabolic Panel:  Recent Labs  Lab 08/09/22 1955 08/09/22 2001 08/10/22 0544 08/11/22 0725  NA 142 141 136 139  K 3.7 3.5 3.8 3.2*  CL 106 104  --  104  CO2 22  --   --  26  GLUCOSE 97 98  --  98  BUN 15 17  --  24*  CREATININE 1.36* 1.40*  --  1.55*  CALCIUM 9.6  --   --  9.7  MG  --   --   --  2.0   Lipid Panel:  Recent Labs  Lab 08/11/22 0725  CHOL 189  TRIG 83  HDL 33*  CHOLHDL 5.7  VLDL 17  LDLCALC 139*   HgbA1c:  Recent Labs  Lab 08/10/22 0539  HGBA1C 5.7*   Urine Drug Screen: No results for input(s): "LABOPIA", "COCAINSCRNUR", "LABBENZ", "AMPHETMU", "THCU", "LABBARB" in the last 168 hours.  Alcohol Level  Recent Labs  Lab 08/09/22 1955  ETH <10    IMAGING past 24 hours EEG adult  Result Date: 08/11/2022 Lora Havens, MD     08/11/2022 11:33 AM Patient Name: Tiffany Black MRN: [id number] Epilepsy Attending: Lora Havens Referring Physician/Provider: Rosalin Hawking, MD Date: 08/11/2022 Duration: 22.09 mins Patient history: 88 year old female with altered mental status.  EEG to evaluate for seizure.  Level of alertness: Awake AEDs during EEG study: None Technical aspects: This EEG study was done with scalp electrodes positioned according to the 10-20 International system of electrode placement. Electrical activity was reviewed with band pass filter of 1-'70Hz'$ , sensitivity of 7 uV/mm, display speed of 9m/sec with a '60Hz'$  notched filter applied as appropriate. EEG data were recorded continuously and digitally stored.  Video monitoring was available and reviewed as appropriate. Description: The posterior dominant rhythm consists of 8 Hz activity of moderate voltage (25-35 uV) seen predominantly in posterior head regions, symmetric and reactive to eye opening and eye closing. EEG showed intermittent generalized 3 to 5 Hz delta and delta slowing. Hyperventilation and photic stimulation were not performed.   ABNORMALITY - Intermittent slow, generalized IMPRESSION: This study is suggestive of mild diffuse encephalopathy, nonspecific etiology.  No seizures or epileptiform discharges were seen throughout the recording. PMontour   PHYSICAL EXAM Physical Exam  HEENT-  [place]/AT   Lungs- Respirations unlabored Extremities- No edema  Neurological Examination Pt is pleasant, smiling, fluent language, following all simple commands, no aphasia, naming 3/4 and able to repeat. However avoiding to answer orientation questions. Cranial nerves intact, no focal deficit moving all extremities sensation and coordination intact.   ASSESSMENT/PLAN Tiffany Black is a 88 y.o. female with history of dementia, cataracts, strokes in 2015 and 2016, HLD, mild carotid atherosclerotic disease by ultrasound, HTN, insomnia and persistent atrial fibrillation (on Xarelto) who presents to the ED via EMS as a Code Stroke after she was noted to have become acutely aphasic during an EMS call for a fall she had sustained a few minutes earlier. Per ED notes she has had 3 falls this week. She was in afib with RVR on arrival to ED and  started on a cardizem infusion.  Speech disturbance in the setting of dementia and s/p fall Code Stroke CT head No acute abnormality. ASPECTS 10.    CTA head & neck NO LVO or high grade stenosis MRI  - No acute intracranial abnormality. chronic infarct in the left corona radiata. Volume loss likely has mild frontal and temporal lobe predominance. 2D Echo EF 60-65% LDL 139 HgbA1c 5.7 VTE prophylaxis - Xeralto Xarelto (rivaroxaban) daily prior to admission, now resume xarelto. Therapy recommendations:  SNF Disposition:  pending  Dementia with behavior disturbance Altered mental status No leukocytosis or fever Trace leukocytes and mod hgb in urine EEG mild diffuse encephalopathy, no seizure   Atrial fibrillation Home meds: rate control with lopressor and anticoagulation with xarelto RVR on admission Cardizem drip -> now off -> po cardizem Lopressor resumed  History of stroke 04/2015 admitted for dizziness.  MRI showed tiny right temporal infarct, likely incidental finding.  MRI showed occluded right P1, atherosclerosis left PCA and left MCA.  Carotid Doppler negative.  LDL 140, A1c 5.8.  EF 55 to 60%.  Discharged on Xarelto and pravastatin 40.  Hypertension Home meds:  metoprolol, valsartan/diovan Stable Avoid low BP Long-term BP goal normotensive  Hyperlipidemia Home meds:  None LDL 139, goal < 70 Statin intolerance listed in chart Add zetia, continue on discharge  Other Stroke Risk Factors Advanced Age >/= 26   Other Active Problems Chronic vertigo  Hospital day # 1  Neurology will sign off. Please call with questions. Pt will follow up with [facility] in about 4 weeks. Thanks for the consult.   Rosalin Hawking, MD PhD Stroke Neurology 08/11/2022 6:35 PM    To contact Stroke Continuity provider, please refer to [email or web address] After hours, contact General Neurology

## 2022-08-11 NOTE — Procedures (Addendum)
Patient Name: Tiffany Black  MRN: [id number]  Epilepsy Attending: Lora Havens  Referring Physician/Provider: Rosalin Hawking, MD  Date: 08/11/2022 Duration: 22.09 mins  Patient history: 88 year old female with altered mental status.  EEG to evaluate for seizure.  Level of alertness: Awake  AEDs during EEG study: None  Technical aspects: This EEG study was done with scalp electrodes positioned according to the 10-20 International system of electrode placement. Electrical activity was reviewed with band pass filter of 1-'70Hz'$ , sensitivity of 7 uV/mm, display speed of 46m/sec with a '60Hz'$  notched filter applied as appropriate. EEG data were recorded continuously and digitally stored.  Video monitoring was available and reviewed as appropriate.  Description: The posterior dominant rhythm consists of 8 Hz activity of moderate voltage (25-35 uV) seen predominantly in posterior head regions, symmetric and reactive to eye opening and eye closing. EEG showed intermittent generalized 3 to 5 Hz delta and delta slowing. Hyperventilation and photic stimulation were not performed.     ABNORMALITY - Intermittent slow, generalized  IMPRESSION: This study is suggestive of mild diffuse encephalopathy, nonspecific etiology.  No seizures or epileptiform discharges were seen throughout the recording.  [name] [name]

## 2022-08-11 NOTE — Progress Notes (Signed)
EEG complete - results pending 

## 2022-08-11 NOTE — TOC Initial Note (Signed)
Transition of Care [facility]) - Initial/Assessment Note    Patient Details  Name: Tiffany Black MRN: [id number] Date of Birth: Sep 05, 1934  [facility]) CM/SW Contact:    Bethann Berkshire, [place] Phone Number: 08/11/2022, 4:03 PM  Clinical Narrative:                  Pt's son called CSW back. CSW explained SNF recs. Pt lives at home with her son and has been to [facility] SNF years ago. Son is agreeable to SNF workup. CSW explained SNF w/u and insurance auth process. Pt will need PTAR at DC. Fl2 completed and bed requests sent in hub. TOC will follow up with bed offers.   Expected Discharge Plan: [place] Barriers to Discharge: Continued Medical Work up   Patient Goals and CMS Choice        Expected Discharge Plan and Services Expected Discharge Plan: [place] arrangements for the past 2 months: Single Family Home                                      Prior Living Arrangements/Services Living arrangements for the past 2 months: Single Family Home Lives with:: Adult Children Patient language and need for interpreter reviewed:: Yes        Need for Family Participation in Patient Care: Yes (Comment) Care giver support system in place?: Yes (comment)   Criminal Activity/Legal Involvement Pertinent to Current Situation/Hospitalization: No - Comment as needed  Activities of Daily Living      Permission Sought/Granted                  Emotional Assessment       Orientation: : Oriented to Self Alcohol / Substance Use: Not Applicable Psych Involvement: No (comment)  Admission diagnosis:  Aphasia [R47.01] Atrial fibrillation with RVR ([place]) [I48.91] Transient speech disturbance [R47.9] Patient Active Problem List   Diagnosis Date Noted   Acute encephalopathy 08/10/2022   GAD (generalized anxiety disorder) 08/10/2022   TIA (transient ischemic attack) 08/10/2022   Aphasia 08/09/2022   Atrial fibrillation  with RVR ([place]) 09/07/2021   Headache syndrome 07/02/2017   Chest pain 05/23/2015   Essential hypertension    History of stroke 05/10/2015   Dizziness 05/10/2015   Orthostatic hypotension 05/10/2015   CVA (cerebral infarction) 05/10/2015   Hypokalemia 09/30/2013   Vertigo 02/28/2013   Sinusitis 02/28/2013   Anxiety 02/28/2013   Paroxysmal atrial fibrillation with RVR ([place]) 02/28/2013   PAC (premature atrial contraction) 04/10/2012   Abnormal EKG 01/19/2012   Persistent atrial fibrillation ([place]) 02/15/2010   PCP:  Ginger Organ., MD Pharmacy:   [id number] [place], [place] - [id number]: [phone]: [address] [id number]: [phone]: [phone]    Social Determinants of Health (SDOH) Interventions    Readmission Risk Interventions     No data to display

## 2022-08-11 NOTE — Progress Notes (Addendum)
PROGRESS NOTE    Tiffany Black  LFY:[id number] DOB: 1934/08/24 DOA: 08/09/2022 PCP: Ginger Organ., MD    Chief Complaint  Patient presents with   Code Stroke    Brief Narrative:    Tiffany Black is a 88 y.o. female with medical history significant for persistent atrial fibrillation chronically anticoagulated on Xarelto, essential hypertension, generalized anxiety disorder, dementia, who is admitted to [facility] on 08/09/2022 for further TIA/stroke work-up after presenting from home to [facility] ED for evaluation word finding difficulties.  And altered mental status.  Assessment & Plan:   Principal Problem:   Aphasia Active Problems:   Paroxysmal atrial fibrillation with RVR (HCC)   Essential hypertension   Atrial fibrillation with RVR (HCC)   Acute encephalopathy   GAD (generalized anxiety disorder)   TIA (transient ischemic attack)  TIA versus acute encephalopathy -She presents with transient focal deficits. -MRI brain with no evidence of acute CVA -PT/OT/SLP consulted -Continue with Xarelto for now -EEG is pending -Preserved EF, intra-atrial septum not well-visualized -He is with dementia at baseline, but mental status currently much worse than baseline  Hypertension -Resume on home medications as low likelihood for TIA/CVA, will add back on Diovan and felodipine today, she remains on metoprolol, will keep on as needed hydralazine as well   Hyperlipidemia -LDL is elevated at 133, but will hold on starting any statin with total CK of 893.  Paroxysmal atrial fibrillation -Continue with Xarelto for anticoagulation -Continue with metoprolol for heart rate control.   Hypokalemia -Repleted, recheck in a.m.   DVT prophylaxis: Xarelto Code Status: (Full) Family Communication: none at bedside, unable to reach son by phone Disposition:      Consultants:  Neurology  Subjective: No significant events overnight as discussed with  staff  Objective: Vitals:   08/11/22 0045 08/11/22 0100 08/11/22 0245 08/11/22 0743  BP: 125/89 111/89 (!) 176/88 (!) 197/128  Pulse: 94 87 69 61  Resp: '20 14 18 19  '$ Temp:   98.2 F (36.8 C) 97.8 F (36.6 C)  TempSrc:   Oral Axillary  SpO2: 92% 95% 99% 98%  Weight:      Height:       No intake or output data in the 24 hours ending 08/11/22 1127  Filed Weights   08/09/22 2050  Weight: 69.3 kg    Examination:  Sleeping comfortably this morning, NAD, frail , deconditioned. Symmetrical Chest wall movement, Good air movement bilaterally, CTAB Irregular ,No Gallops,Rubs or new Murmurs, No Parasternal Heave +ve B.Sounds, Abd Soft, No tenderness, No rebound - guarding or rigidity. No Cyanosis, Clubbing or edema, No new Rash or bruise       Data Reviewed: I have personally reviewed following labs and imaging studies  CBC: Recent Labs  Lab 08/09/22 1955 08/09/22 2001 08/10/22 0539 08/10/22 0544  WBC 8.4  --  9.0  --   NEUTROABS 5.8  --  8.1*  --   HGB 13.6 13.3 13.4 14.3  HCT 40.2 39.0 40.4 42.0  MCV 94.8  --  95.1  --   PLT 142*  --  149*  --     Basic Metabolic Panel: Recent Labs  Lab 08/09/22 1955 08/09/22 2001 08/10/22 0544 08/11/22 0725  NA 142 141 136 139  K 3.7 3.5 3.8 3.2*  CL 106 104  --  104  CO2 22  --   --  26  GLUCOSE 97 98  --  98  BUN 15 17  --  24*  CREATININE 1.36* 1.40*  --  1.55*  CALCIUM 9.6  --   --  9.7  MG  --   --   --  2.0    GFR: Estimated Creatinine Clearance: 26.3 mL/min (A) (by C-G formula based on SCr of 1.55 mg/dL (H)).  Liver Function Tests: Recent Labs  Lab 08/09/22 1955 08/11/22 0725  AST 27 49*  ALT 11 18  ALKPHOS 62 54  BILITOT 0.7 1.0  PROT 6.4* 6.1*  ALBUMIN 3.9 3.6    CBG: Recent Labs  Lab 08/09/22 1957  GLUCAP 104*     Recent Results (from the past 240 hour(s))  Resp Panel by RT-PCR (Flu A&B, Covid) Anterior Nasal Swab     Status: None   Collection Time: 08/09/22  9:00 PM   Specimen: Anterior  Nasal Swab  Result Value Ref Range Status   SARS Coronavirus 2 by RT PCR NEGATIVE NEGATIVE Final    Comment: (NOTE) SARS-CoV-2 target nucleic acids are NOT DETECTED.  The SARS-CoV-2 RNA is generally detectable in upper respiratory specimens during the acute phase of infection. The lowest concentration of SARS-CoV-2 viral copies this assay can detect is 138 copies/mL. A negative result does not preclude SARS-Cov-2 infection and should not be used as the sole basis for treatment or other patient management decisions. A negative result may occur with  improper specimen collection/handling, submission of specimen other than nasopharyngeal swab, presence of viral mutation(s) within the areas targeted by this assay, and inadequate number of viral copies(<138 copies/mL). A negative result must be combined with clinical observations, patient history, and epidemiological information. The expected result is Negative.  Fact Sheet for Patients:  EntrepreneurPulse.com.au  Fact Sheet for Healthcare Providers:  IncredibleEmployment.be  This test is no t yet approved or cleared by the [place] FDA and  has been authorized for detection and/or diagnosis of SARS-CoV-2 by FDA under an Emergency Use Authorization (EUA). This EUA will remain  in effect (meaning this test can be used) for the duration of the COVID-19 declaration under Section 564(b)(1) of the Act, 21 U.S.C.section 360bbb-3(b)(1), unless the authorization is terminated  or revoked sooner.       Influenza A by PCR NEGATIVE NEGATIVE Final   Influenza B by PCR NEGATIVE NEGATIVE Final    Comment: (NOTE) The Xpert Xpress SARS-CoV-2/FLU/RSV plus assay is intended as an aid in the diagnosis of influenza from Nasopharyngeal swab specimens and should not be used as a sole basis for treatment. Nasal washings and aspirates are unacceptable for Xpert Xpress SARS-CoV-2/FLU/RSV testing.  Fact Sheet for  Patients: EntrepreneurPulse.com.au  Fact Sheet for Healthcare Providers: IncredibleEmployment.be  This test is not yet approved or cleared by the [place] FDA and has been authorized for detection and/or diagnosis of SARS-CoV-2 by FDA under an Emergency Use Authorization (EUA). This EUA will remain in effect (meaning this test can be used) for the duration of the COVID-19 declaration under Section 564(b)(1) of the Act, 21 U.S.C. section 360bbb-3(b)(1), unless the authorization is terminated or revoked.  Performed at [facility], [place] [address] [place], [place] [address]          Radiology Studies: ECHOCARDIOGRAM COMPLETE  Result Date: 08/10/2022    ECHOCARDIOGRAM REPORT   Patient Name:   TAHIRIH LAIR Date of Exam: 08/10/2022 Medical Rec #:  [id number]     Height:       68.0 in Accession #:    [id number]    Weight:       152.8  lb Date of Birth:  [birth year]     BSA:          1.823 m Patient Age:    59 years      BP:           146/87 mmHg Patient Gender: F             HR:           68 bpm. Exam Location:  Inpatient Procedure: 2D Echo Indications:     TIA  History:         Patient has prior history of Echocardiogram examinations, most                  recent 04/10/2017. Arrythmias:Atrial Fibrillation; Risk                  Factors:Hypertension.  Sonographer:     Harvie Junior Referring Phys:  [id number] Rhetta Mura Diagnosing Phys: [facility]  Sonographer Comments: Technically difficult study due to poor echo windows. Patient confused and trying to get up. IMPRESSIONS  1. Left ventricular ejection fraction, by estimation, is 60 to 65%. The left ventricle has normal function. The left ventricle has no regional wall motion abnormalities. There is mild left ventricular hypertrophy. Indeterminate diastolic filling due to E-A fusion.  2. Right ventricular systolic function is normal. The right ventricular size is normal. There is normal pulmonary  artery systolic pressure. The estimated right ventricular systolic pressure is 45.4 mmHg.  3. No evidence of mitral valve regurgitation.  4. Aortic valve regurgitation is not visualized. Aortic valve sclerosis/calcification is present, without any evidence of aortic stenosis.  5. The inferior vena cava is normal in size with greater than 50% respiratory variability, suggesting right atrial pressure of 3 mmHg. Comparison(s): No prior Echocardiogram. FINDINGS  Left Ventricle: Left ventricular ejection fraction, by estimation, is 60 to 65%. The left ventricle has normal function. The left ventricle has no regional wall motion abnormalities. The left ventricular internal cavity size was normal in size. There is  mild left ventricular hypertrophy. Indeterminate diastolic filling due to E-A fusion. Right Ventricle: The right ventricular size is normal. Right ventricular systolic function is normal. There is normal pulmonary artery systolic pressure. The tricuspid regurgitant velocity is 2.84 m/s, and with an assumed right atrial pressure of 3 mmHg,  the estimated right ventricular systolic pressure is 09.8 mmHg. Left Atrium: Left atrial size was normal in size. Right Atrium: Right atrial size was normal in size. Pericardium: There is no evidence of pericardial effusion. Mitral Valve: Mild mitral annular calcification. No evidence of mitral valve regurgitation. Tricuspid Valve: Tricuspid valve regurgitation is trivial. Aortic Valve: Aortic valve regurgitation is not visualized. Aortic valve sclerosis/calcification is present, without any evidence of aortic stenosis. Pulmonic Valve: Pulmonic valve regurgitation is not visualized. Aorta: The aortic root and ascending aorta are structurally normal, with no evidence of dilitation. Venous: The inferior vena cava is normal in size with greater than 50% respiratory variability, suggesting right atrial pressure of 3 mmHg. IAS/Shunts: The interatrial septum was not well visualized.   LEFT VENTRICLE PLAX 2D LVIDd:         3.50 cm     Diastology LVIDs:         2.70 cm     LV e' medial:    5.87 cm/s LV PW:         1.20 cm     LV E/e' medial:  13.5 LV IVS:  1.20 cm     LV e' lateral:   12.10 cm/s LVOT diam:     1.90 cm     LV E/e' lateral: 6.6 LV SV:         40 LV SV Index:   22 LVOT Area:     2.84 cm  LV Volumes (MOD) LV vol d, MOD A2C: 54.9 ml LV vol d, MOD A4C: 90.6 ml LV vol s, MOD A2C: 24.9 ml LV vol s, MOD A4C: 35.3 ml LV SV MOD A2C:     30.0 ml LV SV MOD A4C:     90.6 ml LV SV MOD BP:      46.0 ml RIGHT VENTRICLE RV Basal diam:  2.90 cm RV Mid diam:    2.20 cm RV S prime:     10.30 cm/s TAPSE (M-mode): 1.3 cm LEFT ATRIUM             Index        RIGHT ATRIUM           Index LA diam:        3.80 cm 2.08 cm/m   RA Area:     17.90 cm LA Vol (A2C):   43.9 ml 24.09 ml/m  RA Volume:   47.00 ml  25.79 ml/m LA Vol (A4C):   55.9 ml 30.67 ml/m LA Biplane Vol: 54.1 ml 29.68 ml/m  AORTIC VALVE             PULMONIC VALVE LVOT Vmax:   78.50 cm/s  PV Vmax:       0.93 m/s LVOT Vmean:  50.700 cm/s PV Peak grad:  3.4 mmHg LVOT VTI:    0.140 m  AORTA Ao Root diam: 3.30 cm Ao Asc diam:  3.00 cm MITRAL VALVE               TRICUSPID VALVE MV Area (PHT): 3.77 cm    TR Peak grad:   32.3 mmHg MV Decel Time: 201 msec    TR Vmax:        284.00 cm/s MV E velocity: 79.50 cm/s MV A velocity: 26.00 cm/s  SHUNTS MV E/A ratio:  3.06        Systemic VTI:  0.14 m                            Systemic Diam: 1.90 cm Phineas Inches Electronically signed by Phineas Inches Signature Date/Time: 08/10/2022/12:59:48 PM    Final (Updated)    [name] CONTRAST  Result Date: 08/10/2022 CLINICAL DATA:  Stroke suspected.  Expressive aphasia. EXAM: MRI HEAD WITHOUT CONTRAST TECHNIQUE: Multiplanar, multiecho pulse sequences of the brain and surrounding structures were obtained without intravenous contrast. COMPARISON:  MRI Brain 05/10/15, CT Head/neck angiogram 08/09/22 FINDINGS: Limitations: Susceptibility weighted imaging was  not performed due to patient intolerance. Brain: No acute infarction, hemorrhage, hydrocephalus, extra-axial collection or mass lesion. Sequela of severe chronic microvascular ischemic change with chronic infarct in the left corona radiata. The degree of chronic microvascular ischemic change has markedly progressed compared to 2016. There is also advanced generalized volume loss, which has also progressed compared to 2016. Volume loss likely has mild frontal and temporal lobe predominance Vascular: Normal flow voids. Skull and upper cervical spine: Normal marrow signal. Sinuses/Orbits: Bilateral lens replacement. Mucosal thickening left maxillary, ethmoid, and frontal sinus. No mastoid effusion. Other: None IMPRESSION: Limitations: Susceptibility weighted imaging was not performed due to patient intolerance. 1. No  acute intracranial abnormality. 2. Sequela of severe chronic microvascular ischemic change with chronic infarct in the left corona radiata, progressed compared to 2016. 3. Advanced generalized volume loss, progressed compared to 2016. Volume loss likely has mild frontal and temporal lobe predominance. Electronically Signed   By: Marin Roberts M.D.   On: 08/10/2022 10:02   DG Chest Portable 1 View  Result Date: 08/09/2022 CLINICAL DATA:  Altered level of consciousness EXAM: PORTABLE CHEST 1 VIEW COMPARISON:  05/30/2022 FINDINGS: Single frontal view of the chest demonstrates an enlarged cardiac silhouette, stable. No acute airspace disease, effusion, or pneumothorax. No acute bony abnormalities. IMPRESSION: 1. Stable chest, no acute process. Electronically Signed   By: Randa Ngo M.D.   On: 08/09/2022 21:15   CT ANGIO HEAD NECK W WO CM (CODE STROKE)  Result Date: 08/09/2022 CLINICAL DATA:  Aphasia EXAM: CT ANGIOGRAPHY HEAD AND NECK TECHNIQUE: Multidetector CT imaging of the head and neck was performed using the standard protocol during bolus administration of intravenous contrast. Multiplanar  CT image reconstructions and MIPs were obtained to evaluate the vascular anatomy. Carotid stenosis measurements (when applicable) are obtained utilizing NASCET criteria, using the distal internal carotid diameter as the denominator. RADIATION DOSE REDUCTION: This exam was performed according to the departmental dose-optimization program which includes automated exposure control, adjustment of the mA and/or kV according to patient size and/or use of iterative reconstruction technique. CONTRAST:  72m OMNIPAQUE IOHEXOL 350 MG/ML SOLN COMPARISON:  Head CT same day FINDINGS: CTA NECK FINDINGS SKELETON: There is no bony spinal canal stenosis. No lytic or blastic lesion. OTHER NECK: Normal pharynx, larynx and major salivary glands. No cervical lymphadenopathy. Unremarkable thyroid gland. UPPER CHEST: No pneumothorax or pleural effusion. No nodules or masses. AORTIC ARCH: There is no calcific atherosclerosis of the aortic arch. There is no aneurysm, dissection or hemodynamically significant stenosis of the visualized portion of the aorta. Conventional 3 vessel aortic branching pattern. The visualized proximal subclavian arteries are widely patent. RIGHT CAROTID SYSTEM: Normal without aneurysm, dissection or stenosis. LEFT CAROTID SYSTEM: Normal without aneurysm, dissection or stenosis. VERTEBRAL ARTERIES: Codominant configuration. Both origins are clearly patent. There is no dissection, occlusion or flow-limiting stenosis to the skull base (V1-V3 segments). CTA HEAD FINDINGS POSTERIOR CIRCULATION: --Vertebral arteries: Normal V4 segments. --Inferior cerebellar arteries: Normal. --Basilar artery: Normal. --Superior cerebellar arteries: Normal. --Posterior cerebral arteries (PCA): Normal. ANTERIOR CIRCULATION: --Intracranial internal carotid arteries: Normal. --Anterior cerebral arteries (ACA): Normal. Both A1 segments are present. Patent anterior communicating artery (a-comm). --Middle cerebral arteries (MCA): Normal.  VENOUS SINUSES: As permitted by contrast timing, patent. ANATOMIC VARIANTS: None Review of the MIP images confirms the above findings. IMPRESSION: 1. No emergent large vessel occlusion or high-grade stenosis of the intracranial arteries. 2. Normal CTA of the neck. Electronically Signed   By: KUlyses JarredM.D.   On: 08/09/2022 20:54   CT HEAD CODE STROKE WO CONTRAST  Result Date: 08/09/2022 CLINICAL DATA:  Code stroke.  Acute neurologic deficit EXAM: CT HEAD WITHOUT CONTRAST TECHNIQUE: Contiguous axial images were obtained from the base of the skull through the vertex without intravenous contrast. RADIATION DOSE REDUCTION: This exam was performed according to the departmental dose-optimization program which includes automated exposure control, adjustment of the mA and/or kV according to patient size and/or use of iterative reconstruction technique. COMPARISON:  07/08/2022 FINDINGS: Brain: There is no mass, hemorrhage or extra-axial collection. There is generalized atrophy without lobar predilection. There is hypoattenuation of the periventricular white matter, most commonly indicating chronic ischemic microangiopathy. Vascular:  No abnormal hyperdensity of the major intracranial arteries or dural venous sinuses. No intracranial atherosclerosis. Skull: The visualized skull base, calvarium and extracranial soft tissues are normal. Sinuses/Orbits: No fluid levels or advanced mucosal thickening of the visualized paranasal sinuses. No mastoid or middle ear effusion. The orbits are normal. ASPECTS [facility] Stroke Program Early CT Score) - Ganglionic level infarction (caudate, lentiform nuclei, internal capsule, insula, M1-M3 cortex): 7 - Supraganglionic infarction (M4-M6 cortex): 3 Total score (0-10 with 10 being normal): 10 IMPRESSION: 1. No acute intracranial abnormality. 2. ASPECTS is 10. These results were communicated to Dr. Kerney Elbe at 8:16 pm on 08/09/2022 by text page via the [facility] messaging system.  Electronically Signed   By: Ulyses Jarred M.D.   On: 08/09/2022 20:16        Scheduled Meds:   stroke: early stages of recovery book   Does not apply Once   metoprolol tartrate  25 mg Oral BID   Rivaroxaban  15 mg Oral Q supper   Continuous Infusions:     LOS: 1 day       Phillips Climes, MD [facility]   To contact the attending provider between 7A-7P or the covering provider during after hours 7P-7A, please log into the web site [email or web address] and access using universal [place] password for that web site. If you do not have the password, please call the hospital operator.  08/11/2022, 11:27 AM

## 2022-08-11 NOTE — Evaluation (Signed)
Clinical/Bedside Swallow Evaluation Patient Details  Name: Tiffany Black MRN: [id number] Date of Birth: February 25, 1934  Today's Date: 08/11/2022 Time: SLP Start Time (ACUTE ONLY): 33 SLP Stop Time (ACUTE ONLY): 8338 SLP Time Calculation (min) (ACUTE ONLY): 13 min  Past Medical History:  Past Medical History:  Diagnosis Date   Anxiety    Arthritis    Bulging of cervical intervertebral disc    Cardiomegaly    Cataracts, bilateral    Chronic neck pain    History of endometrial cancer    History of stroke    2015 and 2016 // carotid US 8/16: Bilateral 1-39 // carotid US 2/16: Bilateral ICA 1-39   HLD (hyperlipidemia)    Hx of echocardiogram    a. Echo (11/15): Mild LVH, EF 55-60%, normal wall motion, grade 2 diastolic dysfunction, mild LAE, normal RV function, PASP 29 mm Hg // echo 8/16:Mild concentric LVH, EF 55-60, normal wall motion, grade 2 diastolic dysfunction, mild LAE, PASP 39   Hypertension    Insomnia    Lung nodule    OA (osteoarthritis)    Pectus excavatum    Persistent atrial fibrillation ([place])    Vertigo    Past Surgical History:  Past Surgical History:  Procedure Laterality Date   ABDOMINAL HYSTERECTOMY  2011   CATARACT EXTRACTION, BILATERAL Bilateral 2009   [place] HYSTERECTOMY  2011   HPI:  Tiffany Black is a 88 y.o. female who presented to [facility] on 08/09/2022 for further TIA/stroke work-up after presenting from home to [facility] ED for evaluation word finding difficulties and altered mental status. MRI 11/30 with no acute findings.  CXR 11/29 with no acute findings Pt with medical history significant for persistent atrial fibrillation chronically anticoagulated on Xarelto, essential hypertension, generalized anxiety disorder, dementia.    Assessment / Plan / Recommendation  Clinical Impression  Pt presents with very mild expressive and receptive language deficits, which are suspected to be related to decreased LOA.  Pt was  assessed informally.  Initially pt's spontaneous speech was grammatical but devoid of content.  Pt was able to answer y/n questions with 90% accuracy and did not respond to an additional target.  Pt completed confrontational naming task wiht 90% accuracy.  Semantic paraphasia noted x1 ("pencil" for "pen") which pt was abl to correct with cuing.  Pt completed repetition task wiht 80% accuracy, and drowsiness may have impacted performance on this task.  Pt followed commands with aroung 50% accuracy.  Pt benefited from visual model x1 and repetition x1.  Pt's speech was clear and 100% intelligible without any noticeable dysarthria.  It is suspected that pt's level of arousal impacted today's performance.  Pt with eyes closed frequently during asessment.  Pt needed frequent cues to open eyes to look at stimuli.  Pt was noted to snore several times during evaluation as well.  Per chart review pt has hx of dementia, but RN reports pt was indepent with ADLs while living with son.  Baseline deficits suspected, but unknown.  Pt was not appropriate for further assesssment of cognitive function given lethargy/somnolence, but may benefit from additional evaluation when alert if she has not returned to baseline mentation.   SLP Visit Diagnosis: Cognitive communication deficit (R41.841)    Aspiration Risk  Mild aspiration risk    Diet Recommendation Regular;Thin liquid   Liquid Administration via: Cup;Straw Medication Administration:  (As tolerated; no specific precautions) Supervision: Staff to assist with self feeding Compensations: Slow rate;Small sips/bites Postural  Changes: Seated upright at 90 degrees    Other  Recommendations Oral Care Recommendations: Oral care BID    Recommendations for follow up therapy are one component of a multi-disciplinary discharge planning process, led by the attending physician.  Recommendations may be updated based on patient status, additional functional criteria and  insurance authorization.  Follow up Recommendations  (TBD)      Assistance Recommended at Discharge Frequent or constant Supervision/Assistance  Functional Status Assessment Patient has had a recent decline in their functional status and demonstrates the ability to make significant improvements in function in a reasonable and predictable amount of time.  Frequency and Duration min 2x/week          Prognosis Prognosis for Safe Diet Advancement:  (N/A)      Swallow Study   General Date of Onset: 08/09/22 HPI: Tiffany Black is a 88 y.o. female who presented to [facility] on 08/09/2022 for further TIA/stroke work-up after presenting from home to [facility] ED for evaluation word finding difficulties and altered mental status. MRI 11/30 with no acute findings.  CXR 11/29 with no acute findings Pt with medical history significant for persistent atrial fibrillation chronically anticoagulated on Xarelto, essential hypertension, generalized anxiety disorder, dementia. Type of Study: Bedside Swallow Evaluation Previous Swallow Assessment: none Diet Prior to this Study: NPO Temperature Spikes Noted: No Respiratory Status: Room air History of Recent Intubation: No Behavior/Cognition: Confused;Lethargic/Drowsy Oral Cavity Assessment: Within Functional Limits Oral Care Completed by SLP: No Oral Cavity - Dentition: Adequate natural dentition Self-Feeding Abilities: Needs assist Patient Positioning: Partially reclined Baseline Vocal Quality: Normal Volitional Cough: Cognitively unable to elicit Volitional Swallow: Unable to elicit    Oral/Motor/Sensory Function Overall Oral Motor/Sensory Function: Mild impairment Facial ROM: Within Functional Limits Facial Symmetry: Within Functional Limits Lingual ROM:  (unable to assess) Lingual Symmetry: Within Functional Limits Lingual Strength:  (unable to assess) Velum: Within Functional Limits Mandible:  (reduced excursion, seeming 2/2 decreased  effort/inattention)   Ice Chips Ice chips: Not tested   Thin Liquid Thin Liquid: Impaired Presentation: Spoon;Straw Pharyngeal  Phase Impairments: Throat Clearing - Immediate    Nectar Thick Nectar Thick Liquid: Not tested   Honey Thick Honey Thick Liquid: Not tested   Puree Puree: Impaired Oral Phase Impairments: Poor awareness of bolus Oral Phase Functional Implications:  (decreased accetance, small bolus size)   Solid     Solid: Within functional limits      Celedonio Savage, MA, [place] Office: [phone] 08/11/2022,11:33 AM

## 2022-08-11 NOTE — Evaluation (Signed)
Clinical/Bedside Swallow Evaluation Patient Details  Name: Tiffany Black MRN: [id number] Date of Birth: Jun 23, 1934  Today's Date: 08/11/2022 Time: SLP Start Time (ACUTE ONLY): 1034 SLP Stop Time (ACUTE ONLY): 1045 SLP Time Calculation (min) (ACUTE ONLY): 11 min  Past Medical History:  Past Medical History:  Diagnosis Date   Anxiety    Arthritis    Bulging of cervical intervertebral disc    Cardiomegaly    Cataracts, bilateral    Chronic neck pain    History of endometrial cancer    History of stroke    2015 and 2016 // carotid US 8/16: Bilateral 1-39 // carotid US 2/16: Bilateral ICA 1-39   HLD (hyperlipidemia)    Hx of echocardiogram    a. Echo (11/15): Mild LVH, EF 55-60%, normal wall motion, grade 2 diastolic dysfunction, mild LAE, normal RV function, PASP 29 mm Hg // echo 8/16:Mild concentric LVH, EF 55-60, normal wall motion, grade 2 diastolic dysfunction, mild LAE, PASP 39   Hypertension    Insomnia    Lung nodule    OA (osteoarthritis)    Pectus excavatum    Persistent atrial fibrillation ([place])    Vertigo    Past Surgical History:  Past Surgical History:  Procedure Laterality Date   ABDOMINAL HYSTERECTOMY  2011   CATARACT EXTRACTION, BILATERAL Bilateral 2009   [place] HYSTERECTOMY  2011   HPI:  Tiffany Black is a 88 y.o. female who presented to [facility] on 08/09/2022 for further TIA/stroke work-up after presenting from home to [facility] ED for evaluation word finding difficulties and altered mental status. MRI 11/30 with no acute findings.  CXR 11/29 with no acute findings Pt with medical history significant for persistent atrial fibrillation chronically anticoagulated on Xarelto, essential hypertension, generalized anxiety disorder, dementia.    Assessment / Plan / Recommendation  Clinical Impression  Pt presents with a mild oral dysphagia likely 2/2 impaired cognition/lethargy.  Pt asleep, but able to rouse, initially with poor  responsiveness.  Pt was unable to suction from straw and SLP gave a small amount of water by spoon which she sucked off of utensil.  There was a thrat clear following this trial only.  Pt became more alert and there were no clinical s/s of aspiration with serial straw sips.  Pt exhibited good oral clearance of puree and solid.  There was reduced mandibular excursion with puree and pt again sucked bolus from spoon.  Pt may not be able to meet nutritional needs orally if she continues with decreased LOA; however, pt reports pt was more alert, although agitated this morning.  SLP to follow for diet tolerance.    Pt may resume regular texture diet with thin liquid.  SLP Visit Diagnosis: Dysphagia, unspecified (R13.10)    Aspiration Risk  Mild aspiration risk    Diet Recommendation Regular;Thin liquid   Liquid Administration via: Cup;Straw Medication Administration:  (As tolerated; no specific precautions) Supervision: Staff to assist with self feeding Compensations: Slow rate;Small sips/bites Postural Changes: Seated upright at 90 degrees    Other  Recommendations Oral Care Recommendations: Oral care BID    Recommendations for follow up therapy are one component of a multi-disciplinary discharge planning process, led by the attending physician.  Recommendations may be updated based on patient status, additional functional criteria and insurance authorization.  Follow up Recommendations No SLP follow up      Assistance Recommended at Discharge    Functional Status Assessment Patient has had a recent  decline in their functional status and demonstrates the ability to make significant improvements in function in a reasonable and predictable amount of time.  Frequency and Duration min 1 x/week          Prognosis Prognosis for Safe Diet Advancement:  (N/A)      Swallow Study   General Date of Onset: 08/09/22 HPI: Tiffany Black is a 88 y.o. female who presented to [facility] on  08/09/2022 for further TIA/stroke work-up after presenting from home to [facility] ED for evaluation word finding difficulties and altered mental status. MRI 11/30 with no acute findings.  CXR 11/29 with no acute findings Pt with medical history significant for persistent atrial fibrillation chronically anticoagulated on Xarelto, essential hypertension, generalized anxiety disorder, dementia. Type of Study: Bedside Swallow Evaluation Previous Swallow Assessment: none Diet Prior to this Study: NPO Temperature Spikes Noted: No Respiratory Status: Room air History of Recent Intubation: No Behavior/Cognition: Confused;Lethargic/Drowsy Oral Cavity Assessment: Within Functional Limits Oral Care Completed by SLP: No Oral Cavity - Dentition: Adequate natural dentition Self-Feeding Abilities: Needs assist Patient Positioning: Partially reclined Baseline Vocal Quality: Normal Volitional Cough: Cognitively unable to elicit Volitional Swallow: Unable to elicit    Oral/Motor/Sensory Function Overall Oral Motor/Sensory Function: Mild impairment Facial ROM: Within Functional Limits Facial Symmetry: Within Functional Limits Lingual ROM:  (unable to assess) Lingual Symmetry: Within Functional Limits Lingual Strength:  (unable to assess) Velum: Within Functional Limits Mandible:  (reduced excursion)   Ice Chips Ice chips: Not tested   Thin Liquid Thin Liquid: Impaired Presentation: Spoon;Straw Pharyngeal  Phase Impairments: Throat Clearing - Immediate    Nectar Thick Nectar Thick Liquid: Not tested   Honey Thick Honey Thick Liquid: Not tested   Puree Puree: Impaired Oral Phase Impairments: Poor awareness of bolus Oral Phase Functional Implications:  (decreased accetance, small bolus size)   Solid     Solid: Within functional limits      Celedonio Savage, MA, [name] Office: [phone] 08/11/2022,11:19 AM

## 2022-08-11 NOTE — NC FL2 (Signed)
[place] LEVEL OF CARE FORM     IDENTIFICATION  Patient Name: Tiffany Black Birthdate: 18-Apr-1934 Sex: female Admission Date (Current Location): 08/09/2022  [facility] and [place] Number:  Herbalist and Address:  [facility], [place] [address], [place], [place] [address]      Provider Number: [id number]  Attending Physician Name and Address:  Elgergawy, Silver Huguenin, MD  Relative Name and Phone Number:  [place], Fischler (Son)  [phone] [facility])    Current Level of Care: Hospital Recommended Level of Care: [place] Prior Approval Number:    Date Approved/Denied:   PASRR Number: [id number]  Discharge Plan: SNF    Current Diagnoses: Patient Active Problem List   Diagnosis Date Noted   Acute encephalopathy 08/10/2022   GAD (generalized anxiety disorder) 08/10/2022   TIA (transient ischemic attack) 08/10/2022   Aphasia 08/09/2022   Atrial fibrillation with RVR ([place]) 09/07/2021   Headache syndrome 07/02/2017   Chest pain 05/23/2015   Essential hypertension    History of stroke 05/10/2015   Dizziness 05/10/2015   Orthostatic hypotension 05/10/2015   CVA (cerebral infarction) 05/10/2015   Hypokalemia 09/30/2013   Vertigo 02/28/2013   Sinusitis 02/28/2013   Anxiety 02/28/2013   Paroxysmal atrial fibrillation with RVR ([place]) 02/28/2013   PAC (premature atrial contraction) 04/10/2012   Abnormal EKG 01/19/2012   Persistent atrial fibrillation ([facility]) 02/15/2010    Orientation RESPIRATION BLADDER Height & Weight     Self  Normal Incontinent, External catheter Weight: 152 lb 12.5 oz (69.3 kg) Height:  '5\' 8"'$  (172.7 cm)  BEHAVIORAL SYMPTOMS/MOOD NEUROLOGICAL BOWEL NUTRITION STATUS      Continent Diet (see d/c summary)  AMBULATORY STATUS COMMUNICATION OF NEEDS Skin   Extensive Assist Verbally Normal                       Personal Care Assistance Level of Assistance  Bathing, Feeding, Dressing Bathing  Assistance: Maximum assistance Feeding assistance: Independent Dressing Assistance: Maximum assistance     Functional Limitations Info  Sight, Hearing, Speech Sight Info: Adequate Hearing Info: Adequate Speech Info: Adequate    SPECIAL CARE FACTORS FREQUENCY  OT (By licensed OT), PT (By licensed PT)     PT Frequency: 5x/week OT Frequency: 5x/week            Contractures Contractures Info: Not present    Additional Factors Info  Code Status, Allergies Code Status Info: Full code Allergies Info: see d/c summary           Current Medications (08/11/2022):  This is the current hospital active medication list Current Facility-Administered Medications  Medication Dose Route Frequency Provider Last Rate Last Admin    stroke: early stages of recovery book   Does not apply Once Howerter, Justin B, DO       acetaminophen (TYLENOL) tablet 650 mg  650 mg Oral Q6H PRN Howerter, Justin B, DO       Or   acetaminophen (TYLENOL) suppository 650 mg  650 mg Rectal Q6H PRN Howerter, Justin B, DO       diltiazem (CARDIZEM) injection 10 mg  10 mg Intravenous Q4H PRN Howerter, Justin B, DO       felodipine (PLENDIL) 24 hr tablet 5 mg  5 mg Oral Daily Elgergawy, Silver Huguenin, MD   5 mg at 08/11/22 1219   haloperidol lactate (HALDOL) injection 5 mg  5 mg Intramuscular Q6H PRN Howerter, Justin B, DO   5 mg  at 08/10/22 1752   irbesartan (AVAPRO) tablet 150 mg  150 mg Oral Daily Elgergawy, Silver Huguenin, MD   150 mg at 08/11/22 1219   melatonin tablet 3 mg  3 mg Oral QHS PRN Howerter, Justin B, DO       metoprolol tartrate (LOPRESSOR) injection 5 mg  5 mg Intravenous Q6H PRN Elgergawy, Silver Huguenin, MD   5 mg at 08/11/22 0916   metoprolol tartrate (LOPRESSOR) tablet 25 mg  25 mg Oral BID Howerter, Justin B, DO   25 mg at 08/11/22 1219   potassium chloride SA (KLOR-CON M) CR tablet 40 mEq  40 mEq Oral Q6H Elgergawy, Silver Huguenin, MD   40 mEq at 08/11/22 1552   Rivaroxaban (XARELTO) tablet 15 mg  15 mg Oral Q supper  Howerter, Justin B, DO   15 mg at 08/11/22 1559     Discharge Medications: Please see discharge summary for a list of discharge medications.  Relevant Imaging Results:  Relevant Lab Results:   Additional Information SSN [place], [place]

## 2022-08-11 NOTE — Plan of Care (Signed)
  Problem: Education: Goal: Knowledge of disease or condition will improve Outcome: Progressing Goal: Knowledge of secondary prevention will improve (MUST DOCUMENT ALL) Outcome: Progressing Goal: Knowledge of patient specific risk factors will improve [place] N/A or DELETE if not current risk factor) Outcome: Progressing   Problem: Ischemic Stroke/TIA Tissue Perfusion: Goal: Complications of ischemic stroke/TIA will be minimized Outcome: Progressing   Problem: Coping: Goal: Will verbalize positive feelings about self Outcome: Progressing Goal: Will identify appropriate support needs Outcome: Progressing   Problem: Health Behavior/Discharge Planning: Goal: Ability to manage health-related needs will improve Outcome: Progressing Goal: Goals will be collaboratively established with patient/family Outcome: Progressing   Problem: Self-Care: Goal: Ability to participate in self-care as condition permits will improve Outcome: Progressing Goal: Verbalization of feelings and concerns over difficulty with self-care will improve Outcome: Progressing Goal: Ability to communicate needs accurately will improve Outcome: Progressing   Problem: Nutrition: Goal: Risk of aspiration will decrease Outcome: Progressing Goal: Dietary intake will improve Outcome: Progressing   Problem: Safety: Goal: Non-violent Restraint(s) Outcome: Progressing   Problem: Education: Goal: Knowledge of General Education information will improve Description: Including pain rating scale, medication(s)/side effects and non-pharmacologic comfort measures Outcome: Progressing   Problem: Health Behavior/Discharge Planning: Goal: Ability to manage health-related needs will improve Outcome: Progressing   Problem: Clinical Measurements: Goal: Ability to maintain clinical measurements within normal limits will improve Outcome: Progressing Goal: Will remain free from infection Outcome: Progressing Goal: Diagnostic  test results will improve Outcome: Progressing Goal: Respiratory complications will improve Outcome: Progressing Goal: Cardiovascular complication will be avoided Outcome: Progressing   Problem: Activity: Goal: Risk for activity intolerance will decrease Outcome: Progressing   Problem: Nutrition: Goal: Adequate nutrition will be maintained Outcome: Progressing   Problem: Coping: Goal: Level of anxiety will decrease Outcome: Progressing   Problem: Elimination: Goal: Will not experience complications related to bowel motility Outcome: Progressing Goal: Will not experience complications related to urinary retention Outcome: Progressing   Problem: Pain Managment: Goal: General experience of comfort will improve Outcome: Progressing   Problem: Safety: Goal: Ability to remain free from injury will improve Outcome: Progressing   Problem: Skin Integrity: Goal: Risk for impaired skin integrity will decrease Outcome: Progressing

## 2022-08-11 NOTE — Progress Notes (Signed)
CSW called pt's son to discuss SNF rec. Now answer; left voicemail requesting return call.

## 2022-08-11 NOTE — Progress Notes (Signed)
TRH night cross cover note:   I was notified by RN that patient failed her RN bedside swallow screen, with RN also noting that patient was due for a schedule dose of po diltiazem this evening.  Is currently in atrial fibrillation, with heart rates in the 70s to 90s.   In light of this update, will make patient strict n.p.o. in place SLP consult for formal swallow evaluation.  Additionally, as the patient will not miss her scheduled dose of oral diltiazem, I have placed an order for prn IV diltiazem for sustained heart rates in the 130s.   Also, RN conveys that this patient is agitated, pulling at their telemetry leads and peripheral IV, with these behaviors refractory to attempts at verbal redirection.  In the setting of associated interference with ongoing medical treatment posing potential harm to themself, I have placed orders for soft bilateral wrist restraints.    Babs Bertin, DO Hospitalist

## 2022-08-11 NOTE — Progress Notes (Signed)
TRH night cross cover note:  I was notified by RN that this patient continues to be agitated, pulling at their peripheral IV's, with these behaviors refractory to attempts at verbal redirection.  In the setting of associated interference with ongoing medical treatment posing potential harm to themself, I have renewed orders for soft bilateral wrist restraints.    Babs Bertin, DO Hospitalist

## 2022-08-12 DIAGNOSIS — I4891 Unspecified atrial fibrillation: Secondary | ICD-10-CM | POA: Diagnosis not present

## 2022-08-12 DIAGNOSIS — R4701 Aphasia: Secondary | ICD-10-CM | POA: Diagnosis not present

## 2022-08-12 DIAGNOSIS — G9340 Encephalopathy, unspecified: Secondary | ICD-10-CM | POA: Diagnosis not present

## 2022-08-12 DIAGNOSIS — I10 Essential (primary) hypertension: Secondary | ICD-10-CM | POA: Diagnosis not present

## 2022-08-12 LAB — BASIC METABOLIC PANEL
Anion gap: 11 (ref 5–15)
BUN: 22 mg/dL (ref 8–23)
CO2: 25 mmol/L (ref 22–32)
Calcium: 9.9 mg/dL (ref 8.9–10.3)
Chloride: 103 mmol/L (ref 98–111)
Creatinine, Ser: 1.21 mg/dL — ABNORMAL HIGH (ref 0.44–1.00)
GFR, Estimated: 44 mL/min — ABNORMAL LOW (ref >60)
Glucose, Bld: 98 mg/dL (ref 70–99)
Potassium: 3.8 mmol/L (ref 3.5–5.1)
Sodium: 139 mmol/L (ref 135–145)

## 2022-08-12 LAB — CBC
HCT: 38.6 % (ref 36.0–46.0)
Hemoglobin: 12.8 g/dL (ref 12.0–15.0)
MCH: 31 pg (ref 26.0–34.0)
MCHC: 33.2 g/dL (ref 30.0–36.0)
MCV: 93.5 fL (ref 80.0–100.0)
Platelets: 140 10*3/uL — ABNORMAL LOW (ref 150–400)
RBC: 4.13 MIL/uL (ref 3.87–5.11)
RDW: 13.3 % (ref 11.5–15.5)
WBC: 10.9 10*3/uL — ABNORMAL HIGH (ref 4.0–10.5)
nRBC: 0 % (ref 0.0–0.2)

## 2022-08-12 LAB — MAGNESIUM: Magnesium: 2.1 mg/dL (ref 1.7–2.4)

## 2022-08-12 LAB — PHOSPHORUS: Phosphorus: 2.9 mg/dL (ref 2.5–4.6)

## 2022-08-12 MED ORDER — METOPROLOL TARTRATE 50 MG PO TABS
50.0000 mg | ORAL_TABLET | Freq: Two times a day (BID) | ORAL | Status: DC
  Administered 2022-08-12 – 2022-08-15 (×7): 50 mg via ORAL
  Filled 2022-08-12 (×8): qty 1

## 2022-08-12 MED ORDER — IRBESARTAN 150 MG PO TABS
150.0000 mg | ORAL_TABLET | Freq: Every day | ORAL | Status: DC
  Administered 2022-08-12 – 2022-08-15 (×4): 150 mg via ORAL
  Filled 2022-08-12 (×4): qty 1

## 2022-08-12 MED ORDER — HYDRALAZINE HCL 20 MG/ML IJ SOLN
5.0000 mg | INTRAMUSCULAR | Status: DC | PRN
  Administered 2022-08-12 – 2022-08-13 (×2): 5 mg via INTRAVENOUS
  Filled 2022-08-12 (×2): qty 1

## 2022-08-12 MED ORDER — FELODIPINE ER 5 MG PO TB24
10.0000 mg | ORAL_TABLET | Freq: Every day | ORAL | Status: DC
  Administered 2022-08-12 – 2022-08-15 (×4): 10 mg via ORAL
  Filled 2022-08-12: qty 1
  Filled 2022-08-12 (×4): qty 2
  Filled 2022-08-12: qty 1

## 2022-08-12 NOTE — Plan of Care (Signed)
  Problem: Education: Goal: Knowledge of disease or condition will improve Outcome: Not Progressing Goal: Knowledge of secondary prevention will improve (MUST DOCUMENT ALL) Outcome: Not Progressing Goal: Knowledge of patient specific risk factors will improve Tiffany Black N/A or DELETE if not current risk factor) Outcome: Not Progressing   Problem: Ischemic Stroke/TIA Tissue Perfusion: Goal: Complications of ischemic stroke/TIA will be minimized Outcome: Not Progressing

## 2022-08-12 NOTE — Progress Notes (Signed)
PROGRESS NOTE    Tiffany Black  UMP:[id number] DOB: February 13, 1934 DOA: 08/09/2022 PCP: Ginger Organ., MD    Chief Complaint  Patient presents with   Code Stroke    Brief Narrative:    Tiffany Black is a 88 y.o. female with medical history significant for persistent atrial fibrillation chronically anticoagulated on Xarelto, essential hypertension, generalized anxiety disorder, dementia, who is admitted to [facility] on 08/09/2022 for further TIA/stroke work-up after presenting from home to [facility] ED for evaluation word finding difficulties.  And altered mental status.  Assessment & Plan:   Principal Problem:   Aphasia Active Problems:   Paroxysmal atrial fibrillation with RVR (HCC)   Essential hypertension   Atrial fibrillation with RVR (HCC)   Acute encephalopathy   GAD (generalized anxiety disorder)   TIA (transient ischemic attack)  Speech disturbance in the setting of dementia, status post fall -She presents with transient focal deficits.  Worsening mental status.-she dementia at baseline, still status appears to be much worsening upon presentation, there is a likelihood of worsening her dementia. -No evidence of infectious etiology -MRI brain with no evidence of acute CVA -PT/OT/SLP consulted -Continue with Xarelto for now -EEG is pending -Preserved EF, intra-atrial septum not well-visualized   Hypertension -He is back on her home medication, blood pressure remains elevated, so I will go ahead and decrease her metoprolol and felodipine dose.   -Continue with Diovan at home dose.    Hyperlipidemia -LDL is elevated at 133, but will hold on starting any statin with total CK of 893.  Paroxysmal atrial fibrillation -Continue with Xarelto for anticoagulation -Continue with metoprolol for heart rate control.   Hypokalemia -Repleted, recheck in a.m.   DVT prophylaxis: Xarelto Code Status: (Full) Family Communication: none at bedside, unable to reach son  by phone Disposition:      Consultants:  Neurology  Subjective: She is required wrist restraint yesterday as she was trying to get out of bed, otherwise she had a good breakfast this morning.  Objective: Vitals:   08/12/22 0814 08/12/22 0945 08/12/22 0947 08/12/22 1120  BP: (!) 186/113 (!) 184/105 (!) 173/113 (!) 146/92  Pulse: 88 71  98  Resp:    18  Temp:    98.8 F (37.1 C)  TempSrc:    Oral  SpO2:    99%  Weight:      Height:        Intake/Output Summary (Last 24 hours) at 08/12/2022 1324 Last data filed at 08/12/2022 0900 Gross per 24 hour  Intake 354 ml  Output 950 ml  Net -596 ml    Filed Weights   08/09/22 2050  Weight: 69.3 kg    Examination:  Awake today, confused, frail , on wrist restraint Symmetrical Chest wall movement, Good air movement bilaterally, CTAB RRR,No Gallops,Rubs or new Murmurs, No Parasternal Heave +ve B.Sounds, Abd Soft, No tenderness, No rebound - guarding or rigidity. No Cyanosis, Clubbing or edema, No new Rash or bruise      Data Reviewed: I have personally reviewed following labs and imaging studies  CBC: Recent Labs  Lab 08/09/22 1955 08/09/22 2001 08/10/22 0539 08/10/22 0544 08/12/22 0230  WBC 8.4  --  9.0  --  10.9*  NEUTROABS 5.8  --  8.1*  --   --   HGB 13.6 13.3 13.4 14.3 12.8  HCT 40.2 39.0 40.4 42.0 38.6  MCV 94.8  --  95.1  --  93.5  PLT 142*  --  149*  --  140*    Basic Metabolic Panel: Recent Labs  Lab 08/09/22 1955 08/09/22 2001 08/10/22 0544 08/11/22 0725 08/12/22 0230  NA 142 141 136 139 139  K 3.7 3.5 3.8 3.2* 3.8  CL 106 104  --  104 103  CO2 22  --   --  26 25  GLUCOSE 97 98  --  98 98  BUN 15 17  --  24* 22  CREATININE 1.36* 1.40*  --  1.55* 1.21*  CALCIUM 9.6  --   --  9.7 9.9  MG  --   --   --  2.0 2.1  PHOS  --   --   --   --  2.9    GFR: Estimated Creatinine Clearance: 33.7 mL/min (A) (by C-G formula based on SCr of 1.21 mg/dL (H)).  Liver Function Tests: Recent Labs  Lab  08/09/22 1955 08/11/22 0725  AST 27 49*  ALT 11 18  ALKPHOS 62 54  BILITOT 0.7 1.0  PROT 6.4* 6.1*  ALBUMIN 3.9 3.6    CBG: Recent Labs  Lab 08/09/22 1957  GLUCAP 104*     Recent Results (from the past 240 hour(s))  Resp Panel by RT-PCR (Flu A&B, Covid) Anterior Nasal Swab     Status: None   Collection Time: 08/09/22  9:00 PM   Specimen: Anterior Nasal Swab  Result Value Ref Range Status   SARS Coronavirus 2 by RT PCR NEGATIVE NEGATIVE Final    Comment: (NOTE) SARS-CoV-2 target nucleic acids are NOT DETECTED.  The SARS-CoV-2 RNA is generally detectable in upper respiratory specimens during the acute phase of infection. The lowest concentration of SARS-CoV-2 viral copies this assay can detect is 138 copies/mL. A negative result does not preclude SARS-Cov-2 infection and should not be used as the sole basis for treatment or other patient management decisions. A negative result may occur with  improper specimen collection/handling, submission of specimen other than nasopharyngeal swab, presence of viral mutation(s) within the areas targeted by this assay, and inadequate number of viral copies(<138 copies/mL). A negative result must be combined with clinical observations, patient history, and epidemiological information. The expected result is Negative.  Fact Sheet for Patients:  EntrepreneurPulse.com.au  Fact Sheet for Healthcare Providers:  IncredibleEmployment.be  This test is no t yet approved or cleared by the [place] FDA and  has been authorized for detection and/or diagnosis of SARS-CoV-2 by FDA under an Emergency Use Authorization (EUA). This EUA will remain  in effect (meaning this test can be used) for the duration of the COVID-19 declaration under Section 564(b)(1) of the Act, 21 U.S.C.section 360bbb-3(b)(1), unless the authorization is terminated  or revoked sooner.       Influenza A by PCR NEGATIVE NEGATIVE  Final   Influenza B by PCR NEGATIVE NEGATIVE Final    Comment: (NOTE) The Xpert Xpress SARS-CoV-2/FLU/RSV plus assay is intended as an aid in the diagnosis of influenza from Nasopharyngeal swab specimens and should not be used as a sole basis for treatment. Nasal washings and aspirates are unacceptable for Xpert Xpress SARS-CoV-2/FLU/RSV testing.  Fact Sheet for Patients: EntrepreneurPulse.com.au  Fact Sheet for Healthcare Providers: IncredibleEmployment.be  This test is not yet approved or cleared by the [place] FDA and has been authorized for detection and/or diagnosis of SARS-CoV-2 by FDA under an Emergency Use Authorization (EUA). This EUA will remain in effect (meaning this test can be used) for the duration of the COVID-19 declaration under Section 564(b)(1) of the Act, 21 U.S.C.  section 360bbb-3(b)(1), unless the authorization is terminated or revoked.  Performed at [facility], [place] [address], [place], [place] [address]          Radiology Studies: EEG adult  Result Date: 09/05/22 Lora Havens, MD     05-Sep-2022 11:33 AM Patient Name: Jasma Seevers MRN: [id number] Epilepsy Attending: Lora Havens Referring Physician/Provider: Rosalin Hawking, MD Date: 09-05-2022 Duration: 22.09 mins Patient history: 88 year old female with altered mental status.  EEG to evaluate for seizure. Level of alertness: Awake AEDs during EEG study: None Technical aspects: This EEG study was done with scalp electrodes positioned according to the 10-20 International system of electrode placement. Electrical activity was reviewed with band pass filter of 1-'70Hz'$ , sensitivity of 7 uV/mm, display speed of 56m/sec with a '60Hz'$  notched filter applied as appropriate. EEG data were recorded continuously and digitally stored.  Video monitoring was available and reviewed as appropriate. Description: The posterior dominant rhythm consists of 8 Hz activity of  moderate voltage (25-35 uV) seen predominantly in posterior head regions, symmetric and reactive to eye opening and eye closing. EEG showed intermittent generalized 3 to 5 Hz delta and delta slowing. Hyperventilation and photic stimulation were not performed.   ABNORMALITY - Intermittent slow, generalized IMPRESSION: This study is suggestive of mild diffuse encephalopathy, nonspecific etiology.  No seizures or epileptiform discharges were seen throughout the recording. Priyanka [name]       Scheduled Meds:   stroke: early stages of recovery book   Does not apply Once   ezetimibe  10 mg Oral Daily   felodipine  10 mg Oral Daily   irbesartan  150 mg Oral Daily   metoprolol tartrate  50 mg Oral BID   Rivaroxaban  15 mg Oral Q supper   Continuous Infusions:     LOS: 2 days       DPhillips Climes MD [facility]   To contact the attending provider between 7A-7P or the covering provider during after hours 7P-7A, please log into the web site [email or web address] and access using universal [place] password for that web site. If you do not have the password, please call the hospital operator.  08/12/2022, 1:24 PM

## 2022-08-13 DIAGNOSIS — G9340 Encephalopathy, unspecified: Secondary | ICD-10-CM | POA: Diagnosis not present

## 2022-08-13 DIAGNOSIS — I4891 Unspecified atrial fibrillation: Secondary | ICD-10-CM | POA: Diagnosis not present

## 2022-08-13 DIAGNOSIS — R4701 Aphasia: Secondary | ICD-10-CM | POA: Diagnosis not present

## 2022-08-13 DIAGNOSIS — I10 Essential (primary) hypertension: Secondary | ICD-10-CM | POA: Diagnosis not present

## 2022-08-13 MED ORDER — QUETIAPINE FUMARATE 25 MG PO TABS
25.0000 mg | ORAL_TABLET | Freq: Every day | ORAL | Status: DC
  Administered 2022-08-13: 25 mg via ORAL
  Filled 2022-08-13: qty 1

## 2022-08-13 MED ORDER — ORAL CARE MOUTH RINSE: 15.0000 mL | OROMUCOSAL | Status: DC | PRN

## 2022-08-13 MED ORDER — ORAL CARE MOUTH RINSE
15.0000 mL | Freq: Two times a day (BID) | OROMUCOSAL | Status: DC
  Administered 2022-08-13 – 2022-08-15 (×5): 15 mL via OROMUCOSAL

## 2022-08-13 NOTE — Progress Notes (Signed)
PROGRESS NOTE    Tiffany Black  MIW:[id number] DOB: 1934/08/07 DOA: 08/09/2022 PCP: Ginger Organ., MD    Chief Complaint  Patient presents with   Code Stroke    Brief Narrative:    Tiffany Black is a 88 y.o. female with medical history significant for persistent atrial fibrillation chronically anticoagulated on Xarelto, essential hypertension, generalized anxiety disorder, dementia, who is admitted to [facility] on 08/09/2022 for further TIA/stroke work-up after presenting from home to [facility] ED for evaluation word finding difficulties.  And altered mental status.  Assessment & Plan:   Principal Problem:   Aphasia Active Problems:   Paroxysmal atrial fibrillation with RVR (HCC)   Essential hypertension   Atrial fibrillation with RVR (HCC)   Acute encephalopathy   GAD (generalized anxiety disorder)   TIA (transient ischemic attack)  Speech disturbance in the setting of dementia, status post fall -She presents with transient focal deficits.  Worsening mental status.-she dementia at baseline, still status appears to be much worsening upon presentation, there is a likelihood of worsening her dementia. -No evidence of infectious etiology -MRI brain with no evidence of acute CVA -PT/OT/SLP consulted -Continue with Xarelto for now -EEG is pending -Preserved EF, intra-atrial septum not well-visualized -Confused at nighttime where she did require 2, with wrist restraint, so she was started on low-dose Seroquel.   Hypertension -she is back on her home medication, blood pressure remains elevated, so increased  her metoprolol and felodipine dose.   -Continue with Diovan at home dose.    Hyperlipidemia -LDL is elevated at 133, but will hold on starting any statin with total CK of 893.  Paroxysmal atrial fibrillation -Continue with Xarelto for anticoagulation -Continue with metoprolol for heart rate control.   Hypokalemia -Repleted, recheck in a.m.   DVT  prophylaxis: Xarelto Code Status: (Full) Family Communication: Discussed with son by phone 12/2, unable to reach by phone today. Disposition:      Consultants:  Neurology  Subjective:  Overall her appetite has been good, but at night she gets confused, agitated for which she required to go back on wrist restraint.  Objective: Vitals:   08/13/22 0443 08/13/22 0731 08/13/22 0802 08/13/22 1155  BP: (!) 151/101 (!) 164/119 (!) 147/98 102/66  Pulse:  92 92 88  Resp:  16  18  Temp:  98.6 F (37 C)  97.9 F (36.6 C)  TempSrc:  Oral  Oral  SpO2:  97%  96%  Weight:      Height:        Intake/Output Summary (Last 24 hours) at 08/13/2022 1325 Last data filed at 08/13/2022 0400 Gross per 24 hour  Intake 237 ml  Output 600 ml  Net -363 ml    Filed Weights   08/09/22 2050  Weight: 69.3 kg    Examination:  Awake, conversant, confused, impaired judgment and insight, remains on wrist restraint today. Symmetrical Chest wall movement, Good air movement bilaterally, CTAB RRR,No Gallops,Rubs or new Murmurs, No Parasternal Heave +ve B.Sounds, Abd Soft, No tenderness, No rebound - guarding or rigidity. No Cyanosis, Clubbing or edema, No new Rash or bruise      Data Reviewed: I have personally reviewed following labs and imaging studies  CBC: Recent Labs  Lab 08/09/22 1955 08/09/22 2001 08/10/22 0539 08/10/22 0544 08/12/22 0230  WBC 8.4  --  9.0  --  10.9*  NEUTROABS 5.8  --  8.1*  --   --   HGB 13.6 13.3 13.4 14.3 12.8  HCT  40.2 39.0 40.4 42.0 38.6  MCV 94.8  --  95.1  --  93.5  PLT 142*  --  149*  --  140*    Basic Metabolic Panel: Recent Labs  Lab 08/09/22 1955 08/09/22 2001 08/10/22 0544 08/11/22 0725 08/12/22 0230  NA 142 141 136 139 139  K 3.7 3.5 3.8 3.2* 3.8  CL 106 104  --  104 103  CO2 22  --   --  26 25  GLUCOSE 97 98  --  98 98  BUN 15 17  --  24* 22  CREATININE 1.36* 1.40*  --  1.55* 1.21*  CALCIUM 9.6  --   --  9.7 9.9  MG  --   --   --  2.0  2.1  PHOS  --   --   --   --  2.9    GFR: Estimated Creatinine Clearance: 33.7 mL/min (A) (by C-G formula based on SCr of 1.21 mg/dL (H)).  Liver Function Tests: Recent Labs  Lab 08/09/22 1955 08/11/22 0725  AST 27 49*  ALT 11 18  ALKPHOS 62 54  BILITOT 0.7 1.0  PROT 6.4* 6.1*  ALBUMIN 3.9 3.6    CBG: Recent Labs  Lab 08/09/22 1957  GLUCAP 104*     Recent Results (from the past 240 hour(s))  Resp Panel by RT-PCR (Flu A&B, Covid) Anterior Nasal Swab     Status: None   Collection Time: 08/09/22  9:00 PM   Specimen: Anterior Nasal Swab  Result Value Ref Range Status   SARS Coronavirus 2 by RT PCR NEGATIVE NEGATIVE Final    Comment: (NOTE) SARS-CoV-2 target nucleic acids are NOT DETECTED.  The SARS-CoV-2 RNA is generally detectable in upper respiratory specimens during the acute phase of infection. The lowest concentration of SARS-CoV-2 viral copies this assay can detect is 138 copies/mL. A negative result does not preclude SARS-Cov-2 infection and should not be used as the sole basis for treatment or other patient management decisions. A negative result may occur with  improper specimen collection/handling, submission of specimen other than nasopharyngeal swab, presence of viral mutation(s) within the areas targeted by this assay, and inadequate number of viral copies(<138 copies/mL). A negative result must be combined with clinical observations, patient history, and epidemiological information. The expected result is Negative.  Fact Sheet for Patients:  EntrepreneurPulse.com.au  Fact Sheet for Healthcare Providers:  IncredibleEmployment.be  This test is no t yet approved or cleared by the [place] FDA and  has been authorized for detection and/or diagnosis of SARS-CoV-2 by FDA under an Emergency Use Authorization (EUA). This EUA will remain  in effect (meaning this test can be used) for the duration of the COVID-19  declaration under Section 564(b)(1) of the Act, 21 U.S.C.section 360bbb-3(b)(1), unless the authorization is terminated  or revoked sooner.       Influenza A by PCR NEGATIVE NEGATIVE Final   Influenza B by PCR NEGATIVE NEGATIVE Final    Comment: (NOTE) The Xpert Xpress SARS-CoV-2/FLU/RSV plus assay is intended as an aid in the diagnosis of influenza from Nasopharyngeal swab specimens and should not be used as a sole basis for treatment. Nasal washings and aspirates are unacceptable for Xpert Xpress SARS-CoV-2/FLU/RSV testing.  Fact Sheet for Patients: EntrepreneurPulse.com.au  Fact Sheet for Healthcare Providers: IncredibleEmployment.be  This test is not yet approved or cleared by the [place] FDA and has been authorized for detection and/or diagnosis of SARS-CoV-2 by FDA under an Emergency Use Authorization (EUA). This  EUA will remain in effect (meaning this test can be used) for the duration of the COVID-19 declaration under Section 564(b)(1) of the Act, 21 U.S.C. section 360bbb-3(b)(1), unless the authorization is terminated or revoked.  Performed at [facility], [place] [address] [place], [place] [address]          Radiology Studies: No results found.      Scheduled Meds:   stroke: early stages of recovery book   Does not apply Once   ezetimibe  10 mg Oral Daily   felodipine  10 mg Oral Daily   irbesartan  150 mg Oral Daily   metoprolol tartrate  50 mg Oral BID   mouth rinse  15 mL Mouth Rinse BID   Rivaroxaban  15 mg Oral Q supper   Continuous Infusions:     LOS: 3 days       Phillips Climes, MD [facility]   To contact the attending provider between 7A-7P or the covering provider during after hours 7P-7A, please log into the web site [email or web address] and access using universal [place] password for that web site. If you do not have the password, please call the hospital  operator.  08/13/2022, 1:25 PM

## 2022-08-13 NOTE — Plan of Care (Signed)
  Problem: Education: Goal: Knowledge of patient specific risk factors will improve Tiffany Black N/A or DELETE if not current risk factor) Outcome: Progressing   Problem: Ischemic Stroke/TIA Tissue Perfusion: Goal: Complications of ischemic stroke/TIA will be minimized Outcome: Progressing   Problem: Health Behavior/Discharge Planning: Goal: Goals will be collaboratively established with patient/family Outcome: Progressing   Problem: Self-Care: Goal: Ability to communicate needs accurately will improve Outcome: Progressing   Problem: Nutrition: Goal: Risk of aspiration will decrease Outcome: Progressing Goal: Dietary intake will improve Outcome: Progressing   Problem: Safety: Goal: Non-violent Restraint(s) Outcome: Progressing   Problem: Clinical Measurements: Goal: Ability to maintain clinical measurements within normal limits will improve Outcome: Progressing Goal: Will remain free from infection Outcome: Progressing Goal: Diagnostic test results will improve Outcome: Progressing Goal: Respiratory complications will improve Outcome: Progressing Goal: Cardiovascular complication will be avoided Outcome: Progressing   Problem: Nutrition: Goal: Adequate nutrition will be maintained Outcome: Progressing   Problem: Elimination: Goal: Will not experience complications related to bowel motility Outcome: Progressing Goal: Will not experience complications related to urinary retention Outcome: Progressing   Problem: Pain Managment: Goal: General experience of comfort will improve Outcome: Progressing   Problem: Skin Integrity: Goal: Risk for impaired skin integrity will decrease Outcome: Progressing   Problem: Education: Goal: Knowledge of disease or condition will improve Outcome: Not Progressing Goal: Knowledge of secondary prevention will improve (MUST DOCUMENT ALL) Outcome: Not Progressing   Problem: Coping: Goal: Will verbalize positive feelings about  self Outcome: Not Progressing Goal: Will identify appropriate support needs Outcome: Not Progressing   Problem: Health Behavior/Discharge Planning: Goal: Ability to manage health-related needs will improve Outcome: Not Progressing   Problem: Self-Care: Goal: Ability to participate in self-care as condition permits will improve Outcome: Not Progressing Goal: Verbalization of feelings and concerns over difficulty with self-care will improve Outcome: Not Progressing   Problem: Education: Goal: Knowledge of General Education information will improve Description: Including pain rating scale, medication(s)/side effects and non-pharmacologic comfort measures Outcome: Not Progressing   Problem: Health Behavior/Discharge Planning: Goal: Ability to manage health-related needs will improve Outcome: Not Progressing   Problem: Activity: Goal: Risk for activity intolerance will decrease Outcome: Not Progressing   Problem: Coping: Goal: Level of anxiety will decrease Outcome: Not Progressing   Problem: Safety: Goal: Ability to remain free from injury will improve Outcome: Not Progressing

## 2022-08-13 NOTE — Progress Notes (Signed)
TRH night cross cover note:  I was notified by RN that this patient remains agitated, pulling at their lines, with these behaviors refractory to attempts at verbal redirection.  In the setting of associated interference with ongoing medical treatment posing potential harm to themself, I have placed order to renew existing soft bilateral wrist restraints.    Babs Bertin, DO Hospitalist

## 2022-08-13 NOTE — Progress Notes (Signed)
TRH night cross cover note:  Patient continues to attempt to get out of bed and pulling at lines, with these behaviors refractory to attempts at verbal redirection.  In the setting of associated interference with ongoing medical treatment posing potential harm to themself, I have continued existing orders for soft bilateral wrist restraints.  Additionally, patient remains on Xarelto, which she takes chronically in the setting of paroxysmal atrial fibrillation. Will discontinue concomitant order for SCD's at this time.     Babs Bertin, DO Hospitalist

## 2022-08-14 DIAGNOSIS — I4891 Unspecified atrial fibrillation: Secondary | ICD-10-CM | POA: Diagnosis not present

## 2022-08-14 DIAGNOSIS — I10 Essential (primary) hypertension: Secondary | ICD-10-CM | POA: Diagnosis not present

## 2022-08-14 DIAGNOSIS — R4701 Aphasia: Secondary | ICD-10-CM | POA: Diagnosis not present

## 2022-08-14 DIAGNOSIS — G9340 Encephalopathy, unspecified: Secondary | ICD-10-CM | POA: Diagnosis not present

## 2022-08-14 LAB — GLUCOSE, CAPILLARY: Glucose-Capillary: 142 mg/dL — ABNORMAL HIGH (ref 70–99)

## 2022-08-14 MED ORDER — QUETIAPINE FUMARATE 50 MG PO TABS
50.0000 mg | ORAL_TABLET | Freq: Every day | ORAL | Status: DC
  Administered 2022-08-14: 50 mg via ORAL
  Filled 2022-08-14 (×2): qty 1

## 2022-08-14 NOTE — Progress Notes (Signed)
PROGRESS NOTE    Tiffany Black  WER:[id number] DOB: [birth year] DOA: 08/09/2022 PCP: Ginger Organ., MD    Chief Complaint  Patient presents with   Code Stroke    Brief Narrative:    Tiffany Black is a 88 y.o. female with medical history significant for persistent atrial fibrillation chronically anticoagulated on Xarelto, essential hypertension, generalized anxiety disorder, dementia, who is admitted to [facility] on 08/09/2022 for further TIA/stroke work-up after presenting from home to [facility] ED for evaluation word finding difficulties.  And altered mental status.  Assessment & Plan:   Principal Problem:   Aphasia Active Problems:   Paroxysmal atrial fibrillation with RVR (HCC)   Essential hypertension   Atrial fibrillation with RVR (HCC)   Acute encephalopathy   GAD (generalized anxiety disorder)   TIA (transient ischemic attack)  Speech disturbance in the setting of dementia, status post fall -She presents with transient focal deficits.  Worsening mental status.-she dementia at baseline, still status appears to be much worsening upon presentation, there is a likelihood of worsening her dementia. -No evidence of infectious etiology -MRI brain with no evidence of acute CVA -PT/OT/SLP consulted -Continue with Xarelto for now -EEG is pending -Preserved EF, intra-atrial septum not well-visualized -Remains significantly confused at nighttime for which she had to be put on wrist restraint, I will restart Seroquel to 50 nightly, continue with as needed Haldol.   Hypertension -she is back on her home medication, blood pressure remains elevated, so increased  her metoprolol and felodipine dose.   -Continue with Diovan at home dose.    Hyperlipidemia -LDL is elevated at 133, but will hold on starting any statin with total CK of 893.  Paroxysmal atrial fibrillation -Continue with Xarelto for anticoagulation -Continue with metoprolol for heart rate  control.   Hypokalemia -Repleted, recheck in a.m.   DVT prophylaxis: Xarelto Code Status: (Full) Family Communication: Discussed with son by phone today Disposition: SNF     Consultants:  Neurology  Subjective:  Has been good yesterday, she was agitated and restless overnight for which she required wrist restraints  Objective: Vitals:   08/14/22 0300 08/14/22 0429 08/14/22 0806 08/14/22 1123  BP: (!) 148/96  (!) 146/100 (!) 124/90  Pulse: 80  81 (!) 101  Resp: '18  18 18  '$ Temp: 97.8 F (36.6 C)  98.4 F (36.9 C) 98.6 F (37 C)  TempSrc: Oral  Oral Oral  SpO2: 96%  98% 98%  Weight:  61.6 kg    Height:       No intake or output data in the 24 hours ending 08/14/22 1344   Filed Weights   08/09/22 2050 08/14/22 0429  Weight: 69.3 kg 61.6 kg    Examination:  Awake, conversant, confused, impaired judgment and insight, remains on wrist restraint today. Symmetrical Chest wall movement, Good air movement bilaterally, CTAB RRR,No Gallops,Rubs or new Murmurs, No Parasternal Heave +ve B.Sounds, Abd Soft, No tenderness, No rebound - guarding or rigidity. No Cyanosis, Clubbing or edema, No new Rash or bruise      Data Reviewed: I have personally reviewed following labs and imaging studies  CBC: Recent Labs  Lab 08/09/22 1955 08/09/22 2001 08/10/22 0539 08/10/22 0544 08/12/22 0230  WBC 8.4  --  9.0  --  10.9*  NEUTROABS 5.8  --  8.1*  --   --   HGB 13.6 13.3 13.4 14.3 12.8  HCT 40.2 39.0 40.4 42.0 38.6  MCV 94.8  --  95.1  --  93.5  PLT 142*  --  149*  --  140*    Basic Metabolic Panel: Recent Labs  Lab 08/09/22 1955 08/09/22 2001 08/10/22 0544 08/11/22 0725 08/12/22 0230  NA 142 141 136 139 139  K 3.7 3.5 3.8 3.2* 3.8  CL 106 104  --  104 103  CO2 22  --   --  26 25  GLUCOSE 97 98  --  98 98  BUN 15 17  --  24* 22  CREATININE 1.36* 1.40*  --  1.55* 1.21*  CALCIUM 9.6  --   --  9.7 9.9  MG  --   --   --  2.0 2.1  PHOS  --   --   --   --  2.9     GFR: Estimated Creatinine Clearance: 32.5 mL/min (A) (by C-G formula based on SCr of 1.21 mg/dL (H)).  Liver Function Tests: Recent Labs  Lab 08/09/22 1955 08/11/22 0725  AST 27 49*  ALT 11 18  ALKPHOS 62 54  BILITOT 0.7 1.0  PROT 6.4* 6.1*  ALBUMIN 3.9 3.6    CBG: Recent Labs  Lab 08/09/22 1957 08/14/22 1137  GLUCAP 104* 142*     Recent Results (from the past 240 hour(s))  Resp Panel by RT-PCR (Flu A&B, Covid) Anterior Nasal Swab     Status: None   Collection Time: 08/09/22  9:00 PM   Specimen: Anterior Nasal Swab  Result Value Ref Range Status   SARS Coronavirus 2 by RT PCR NEGATIVE NEGATIVE Final    Comment: (NOTE) SARS-CoV-2 target nucleic acids are NOT DETECTED.  The SARS-CoV-2 RNA is generally detectable in upper respiratory specimens during the acute phase of infection. The lowest concentration of SARS-CoV-2 viral copies this assay can detect is 138 copies/mL. A negative result does not preclude SARS-Cov-2 infection and should not be used as the sole basis for treatment or other patient management decisions. A negative result may occur with  improper specimen collection/handling, submission of specimen other than nasopharyngeal swab, presence of viral mutation(s) within the areas targeted by this assay, and inadequate number of viral copies(<138 copies/mL). A negative result must be combined with clinical observations, patient history, and epidemiological information. The expected result is Negative.  Fact Sheet for Patients:  EntrepreneurPulse.com.au  Fact Sheet for Healthcare Providers:  IncredibleEmployment.be  This test is no t yet approved or cleared by the [place] FDA and  has been authorized for detection and/or diagnosis of SARS-CoV-2 by FDA under an Emergency Use Authorization (EUA). This EUA will remain  in effect (meaning this test can be used) for the duration of the COVID-19 declaration under  Section 564(b)(1) of the Act, 21 U.S.C.section 360bbb-3(b)(1), unless the authorization is terminated  or revoked sooner.       Influenza A by PCR NEGATIVE NEGATIVE Final   Influenza B by PCR NEGATIVE NEGATIVE Final    Comment: (NOTE) The Xpert Xpress SARS-CoV-2/FLU/RSV plus assay is intended as an aid in the diagnosis of influenza from Nasopharyngeal swab specimens and should not be used as a sole basis for treatment. Nasal washings and aspirates are unacceptable for Xpert Xpress SARS-CoV-2/FLU/RSV testing.  Fact Sheet for Patients: EntrepreneurPulse.com.au  Fact Sheet for Healthcare Providers: IncredibleEmployment.be  This test is not yet approved or cleared by the [place] FDA and has been authorized for detection and/or diagnosis of SARS-CoV-2 by FDA under an Emergency Use Authorization (EUA). This EUA will remain in effect (meaning this test can be used) for  the duration of the COVID-19 declaration under Section 564(b)(1) of the Act, 21 U.S.C. section 360bbb-3(b)(1), unless the authorization is terminated or revoked.  Performed at [facility], [place] [address] [place], [place] [address]          Radiology Studies: No results found.      Scheduled Meds:   stroke: early stages of recovery book   Does not apply Once   ezetimibe  10 mg Oral Daily   felodipine  10 mg Oral Daily   irbesartan  150 mg Oral Daily   metoprolol tartrate  50 mg Oral BID   mouth rinse  15 mL Mouth Rinse BID   QUEtiapine  50 mg Oral QHS   Rivaroxaban  15 mg Oral Q supper   Continuous Infusions:     LOS: 4 days       Phillips Climes, MD [facility]   To contact the attending provider between 7A-7P or the covering provider during after hours 7P-7A, please log into the web site [email or web address] and access using universal [place] password for that web site. If you do not have the password, please call the hospital  operator.  08/14/2022, 1:44 PM

## 2022-08-14 NOTE — TOC Progression Note (Addendum)
Transition of Care [facility]) - Progression Note    Patient Details  Name: Tiffany Black MRN: [id number] Date of Birth: [birth year]  [facility] CM/SW Contact  Jinger Neighbors, [place] Phone Number: 08/14/2022, 2:08 PM  Clinical Narrative:    CSW completed chart review and viewed bed offers. CSW attempted to contact Danton Clap, pt's son, to review bed offers; no answer and left a vm. CSW noted preferred facility, [facility], did not review referral. CSW called and left a voicemail asking for a returned call.  Gerald Stabs, Pt's son returned phone call. CSW provided bed offers and Gerald Stabs states he will review and f/u with CSW.    Expected Discharge Plan: [place] Barriers to Discharge: Continued Medical Work up  Expected Discharge Plan and Services Expected Discharge Plan: [place] arrangements for the past 2 months: Single Family Home                                       Social Determinants of Health (SDOH) Interventions    Readmission Risk Interventions     No data to display

## 2022-08-15 LAB — BASIC METABOLIC PANEL
Anion gap: 8 (ref 5–15)
BUN: 54 mg/dL — ABNORMAL HIGH (ref 8–23)
CO2: 22 mmol/L (ref 22–32)
Calcium: 9.2 mg/dL (ref 8.9–10.3)
Chloride: 109 mmol/L (ref 98–111)
Creatinine, Ser: 1.71 mg/dL — ABNORMAL HIGH (ref 0.44–1.00)
GFR, Estimated: 29 mL/min — ABNORMAL LOW (ref >60)
Glucose, Bld: 102 mg/dL — ABNORMAL HIGH (ref 70–99)
Potassium: 3.6 mmol/L (ref 3.5–5.1)
Sodium: 139 mmol/L (ref 135–145)

## 2022-08-15 LAB — CBC
HCT: 40.5 % (ref 36.0–46.0)
Hemoglobin: 13.5 g/dL (ref 12.0–15.0)
MCH: 30.9 pg (ref 26.0–34.0)
MCHC: 33.3 g/dL (ref 30.0–36.0)
MCV: 92.7 fL (ref 80.0–100.0)
Platelets: 182 10*3/uL (ref 150–400)
RBC: 4.37 MIL/uL (ref 3.87–5.11)
RDW: 13.4 % (ref 11.5–15.5)
WBC: 9.9 10*3/uL (ref 4.0–10.5)
nRBC: 0 % (ref 0.0–0.2)

## 2022-08-15 MED ORDER — FELODIPINE ER 10 MG PO TB24: 10.0000 mg | ORAL_TABLET | Freq: Every day | ORAL | Status: DC

## 2022-08-15 MED ORDER — QUETIAPINE FUMARATE 50 MG PO TABS: 50.0000 mg | ORAL_TABLET | Freq: Every day | ORAL | Status: DC

## 2022-08-15 MED ORDER — METOPROLOL TARTRATE 50 MG PO TABS: 50.0000 mg | ORAL_TABLET | Freq: Two times a day (BID) | ORAL | Status: DC

## 2022-08-15 MED ORDER — ACETAMINOPHEN 325 MG PO TABS: 650.0000 mg | ORAL_TABLET | Freq: Four times a day (QID) | ORAL | Status: DC | PRN

## 2022-08-15 MED ORDER — EZETIMIBE 10 MG PO TABS: 10.0000 mg | ORAL_TABLET | Freq: Every day | ORAL | Status: DC

## 2022-08-15 NOTE — Progress Notes (Signed)
PROGRESS NOTE    Tiffany Black  RKY:[id number] DOB: December 20, 1933 DOA: 08/09/2022 PCP: Ginger Organ., MD    Chief Complaint  Patient presents with   Code Stroke    Brief Narrative:    Tiffany Black is a 88 y.o. female with medical history significant for persistent atrial fibrillation chronically anticoagulated on Xarelto, essential hypertension, generalized anxiety disorder, dementia, who is admitted to [facility] on 08/09/2022 for further TIA/stroke work-up after presenting from home to [facility] ED for evaluation word finding difficulties.  And altered mental status.  She was seen by neurology, with negative workup, it was felt her findings are related to worsening dementia.  Assessment & Plan:   Principal Problem:   Aphasia Active Problems:   Paroxysmal atrial fibrillation with RVR (HCC)   Essential hypertension   Atrial fibrillation with RVR (HCC)   Acute encephalopathy   GAD (generalized anxiety disorder)   TIA (transient ischemic attack)  Speech disturbance in the setting of dementia, status post fall -She presents with transient focal deficits.  Worsening mental status.-she dementia at baseline, still status appears to be much worsening upon presentation, there is a likelihood of worsening her dementia. -No evidence of infectious etiology -MRI brain with no evidence of acute CVA -PT/OT/SLP consulted -Continue with Xarelto for now -EEG with mild diffuse encephalopathy, no seizures -Preserved EF, intra-atrial septum not well-visualized -Improved on Seroquel, continue with 50 mg milligram at bedtime -Sent appears to be with dementia, as well as significant hospital delirium, she is much improved today, she had a good night sleep, good appetite.  Hypertension -she is back on her home medication, blood pressure remains elevated, so increased  her metoprolol and felodipine dose.   -Continue with Diovan at home dose.    Hyperlipidemia -LDL is elevated at 133, but  will hold on starting any statin with total CK of 893.  Paroxysmal atrial fibrillation -Continue with Xarelto for anticoagulation -Continue with metoprolol for heart rate control.   Hypokalemia -Repleted, recheck in a.m.   DVT prophylaxis: Xarelto Code Status: (Full) Family Communication: Discussed with son by phone 12/4 Disposition: SNF     Consultants:  Neurology  Subjective:  No significant events overnight, she had a good night sleep, as discussed with staff she had a good appetite yesterday.  Objective: Vitals:   08/14/22 2317 08/15/22 0349 08/15/22 0443 08/15/22 0812  BP: 122/88 (!) 146/92  115/88  Pulse: 84 81  92  Resp: '16 18  18  '$ Temp: 98.4 F (36.9 C) 98.8 F (37.1 C)  98.3 F (36.8 C)  TempSrc: Oral Oral  Oral  SpO2: 100% 97%  96%  Weight:   60.2 kg   Height:        Intake/Output Summary (Last 24 hours) at 08/15/2022 1007 Last data filed at 08/14/2022 1400 Gross per 24 hour  Intake 240 ml  Output --  Net 240 ml     Filed Weights   08/09/22 2050 08/14/22 0429 08/15/22 0443  Weight: 69.3 kg 61.6 kg 60.2 kg    Examination:  Awake Alert, is more calm person today, appropriate, follows commands Symmetrical Chest wall movement, Good air movement bilaterally, CTAB RRR,No Gallops,Rubs or new Murmurs, No Parasternal Heave +ve B.Sounds, Abd Soft, No tenderness, No rebound - guarding or rigidity. No Cyanosis, Clubbing or edema, No new Rash or bruise     Data Reviewed: I have personally reviewed following labs and imaging studies  CBC: Recent Labs  Lab 08/09/22 1955 08/09/22 2001 08/10/22 0539  08/10/22 0544 08/12/22 0230 08/15/22 0636  WBC 8.4  --  9.0  --  10.9* 9.9  NEUTROABS 5.8  --  8.1*  --   --   --   HGB 13.6 13.3 13.4 14.3 12.8 13.5  HCT 40.2 39.0 40.4 42.0 38.6 40.5  MCV 94.8  --  95.1  --  93.5 92.7  PLT 142*  --  149*  --  140* 099    Basic Metabolic Panel: Recent Labs  Lab 08/09/22 1955 08/09/22 2001 08/10/22 0544  08/11/22 0725 08/12/22 0230 08/15/22 0636  NA 142 141 136 139 139 139  K 3.7 3.5 3.8 3.2* 3.8 3.6  CL 106 104  --  104 103 109  CO2 22  --   --  '26 25 22  '$ GLUCOSE 97 98  --  98 98 102*  BUN 15 17  --  24* 22 54*  CREATININE 1.36* 1.40*  --  1.55* 1.21* 1.71*  CALCIUM 9.6  --   --  9.7 9.9 9.2  MG  --   --   --  2.0 2.1  --   PHOS  --   --   --   --  2.9  --     GFR: Estimated Creatinine Clearance: 22.4 mL/min (A) (by C-G formula based on SCr of 1.71 mg/dL (H)).  Liver Function Tests: Recent Labs  Lab 08/09/22 1955 08/11/22 0725  AST 27 49*  ALT 11 18  ALKPHOS 62 54  BILITOT 0.7 1.0  PROT 6.4* 6.1*  ALBUMIN 3.9 3.6    CBG: Recent Labs  Lab 08/09/22 1957 08/14/22 1137  GLUCAP 104* 142*     Recent Results (from the past 240 hour(s))  Resp Panel by RT-PCR (Flu A&B, Covid) Anterior Nasal Swab     Status: None   Collection Time: 08/09/22  9:00 PM   Specimen: Anterior Nasal Swab  Result Value Ref Range Status   SARS Coronavirus 2 by RT PCR NEGATIVE NEGATIVE Final    Comment: (NOTE) SARS-CoV-2 target nucleic acids are NOT DETECTED.  The SARS-CoV-2 RNA is generally detectable in upper respiratory specimens during the acute phase of infection. The lowest concentration of SARS-CoV-2 viral copies this assay can detect is 138 copies/mL. A negative result does not preclude SARS-Cov-2 infection and should not be used as the sole basis for treatment or other patient management decisions. A negative result may occur with  improper specimen collection/handling, submission of specimen other than nasopharyngeal swab, presence of viral mutation(s) within the areas targeted by this assay, and inadequate number of viral copies(<138 copies/mL). A negative result must be combined with clinical observations, patient history, and epidemiological information. The expected result is Negative.  Fact Sheet for Patients:  EntrepreneurPulse.com.au  Fact Sheet for  Healthcare Providers:  IncredibleEmployment.be  This test is no t yet approved or cleared by the [place] FDA and  has been authorized for detection and/or diagnosis of SARS-CoV-2 by FDA under an Emergency Use Authorization (EUA). This EUA will remain  in effect (meaning this test can be used) for the duration of the COVID-19 declaration under Section 564(b)(1) of the Act, 21 U.S.C.section 360bbb-3(b)(1), unless the authorization is terminated  or revoked sooner.       Influenza A by PCR NEGATIVE NEGATIVE Final   Influenza B by PCR NEGATIVE NEGATIVE Final    Comment: (NOTE) The Xpert Xpress SARS-CoV-2/FLU/RSV plus assay is intended as an aid in the diagnosis of influenza from Nasopharyngeal swab specimens and should  not be used as a sole basis for treatment. Nasal washings and aspirates are unacceptable for Xpert Xpress SARS-CoV-2/FLU/RSV testing.  Fact Sheet for Patients: EntrepreneurPulse.com.au  Fact Sheet for Healthcare Providers: IncredibleEmployment.be  This test is not yet approved or cleared by the [place] FDA and has been authorized for detection and/or diagnosis of SARS-CoV-2 by FDA under an Emergency Use Authorization (EUA). This EUA will remain in effect (meaning this test can be used) for the duration of the COVID-19 declaration under Section 564(b)(1) of the Act, 21 U.S.C. section 360bbb-3(b)(1), unless the authorization is terminated or revoked.  Performed at [facility], [place] [address], [place], [place] [address]          Radiology Studies: No results found.      Scheduled Meds:   stroke: early stages of recovery book   Does not apply Once   ezetimibe  10 mg Oral Daily   felodipine  10 mg Oral Daily   irbesartan  150 mg Oral Daily   metoprolol tartrate  50 mg Oral BID   mouth rinse  15 mL Mouth Rinse BID   QUEtiapine  50 mg Oral QHS   Rivaroxaban  15 mg Oral Q supper    Continuous Infusions:     LOS: 5 days       Phillips Climes, MD [facility]   To contact the attending provider between 7A-7P or the covering provider during after hours 7P-7A, please log into the web site [email or web address] and access using universal [place] password for that web site. If you do not have the password, please call the hospital operator.  08/15/2022, 10:07 AM

## 2022-08-15 NOTE — TOC Progression Note (Signed)
Transition of Care [facility]) - Progression Note    Patient Details  Name: Nahdia Doucet MRN: [id number] Date of Birth: 12-16-1933  [facility] CM/SW Contact  Jinger Neighbors, [place] Phone Number: 08/15/2022, 10:53 AM  Clinical Narrative:    CSW conversed with pt's son regarding bed acceptances. Gerald Stabs (son) chose [facility]. CSW made contact with Whitney at [facility] to make her aware; however, [facility] not have any female beds. CSW returned call to [place] to review other options: he chose [facility]. CSW Jeanella Cara to inform her of bed choice. CSW called Kia w/ [facility] to inform her of bed choice. CSW mesaged for authorization and Hannah Beat is currently working on [place].    Expected Discharge Plan: [place] Barriers to Discharge: Continued Medical Work up  Expected Discharge Plan and Services Expected Discharge Plan: [place] arrangements for the past 2 months: Single Family Home                                       Social Determinants of Health (SDOH) Interventions    Readmission Risk Interventions     No data to display

## 2022-08-15 NOTE — Progress Notes (Incomplete)
Report called to RN Bertis Ruddy at [facility].

## 2022-08-15 NOTE — Discharge Instructions (Signed)
Follow with Primary MD Ginger Organ., MD /SNF physician  Get CBC, CMP,  checked  by Primary MD next visit.    Activity: As tolerated with Full fall precautions use walker/cane & assistance as needed   Disposition SNF   Diet: Regular diet  On your next visit with your primary care physician please Get Medicines reviewed and adjusted.   Please request your Prim.MD to go over all Hospital Tests and Procedure/Radiological results at the follow up, please get all Hospital records sent to your Prim MD by signing hospital release before you go home.   If you experience worsening of your admission symptoms, develop shortness of breath, life threatening emergency, suicidal or homicidal thoughts you must seek medical attention immediately by calling 911 or calling your MD immediately  if symptoms less severe.  You Must read complete instructions/literature along with all the possible adverse reactions/side effects for all the Medicines you take and that have been prescribed to you. Take any new Medicines after you have completely understood and accpet all the possible adverse reactions/side effects.   Do not drive, operating heavy machinery, perform activities at heights, swimming or participation in water activities or provide baby sitting services if your were admitted for syncope or siezures until you have seen by Primary MD or a Neurologist and advised to do so again.  Do not drive when taking Pain medications.    Do not take more than prescribed Pain, Sleep and Anxiety Medications  Special Instructions: If you have smoked or chewed Tobacco  in the last 2 yrs please stop smoking, stop any regular Alcohol  and or any Recreational drug use.  Wear Seat belts while driving.   Please note  You were cared for by a hospitalist during your hospital stay. If you have any questions about your discharge medications or the care you received while you were in the hospital after you are  discharged, you can call the unit and asked to speak with the hospitalist on call if the hospitalist that took care of you is not available. Once you are discharged, your primary care physician will handle any further medical issues. Please note that NO REFILLS for any discharge medications will be authorized once you are discharged, as it is imperative that you return to your primary care physician (or establish a relationship with a primary care physician if you do not have one) for your aftercare needs so that they can reassess your need for medications and monitor your lab values.

## 2022-08-15 NOTE — Progress Notes (Signed)
Occupational Therapy Treatment Patient Details Name: Tiffany Black MRN: [id number] DOB: 12-Nov-1933 Today's Date: 08/15/2022   History of present illness 88 yo female presents to [facility] on 11/29 with fall, aphasia. CTH negative for acute findings,  Further workup for CVA/TIA pending. Additionally, pt with encephalopathy and afib. PMHx of dementia, cataracts, strokes in 2015 and 2016, HLD, mild carotid atherosclerotic disease by ultrasound, HTN, insomnia and persistent atrial fibrillation (on Xarelto).   OT comments  Pt with noted functional decline in comparison to initial OT eval. On entry, pt with coffee spilt on gown, linens wet and pt requesting to get up. After Mod A to sit EOB, planned to assist pt with cleanup/ADLs sitting EOB. However, pt required continuous hands on assist to maintain sitting balance and unable to correct despite various techniques. Assisted pt back to bed in chair position for ADL assist with Min A for UB ADL and Max-Total A for LB ADLs. Question some L sided inattention due to heavy R lateral lean sitting EOB and difficulty turning head to scan L visual field today. Continue to rec SNF rehab at [facility].   Recommendations for follow up therapy are one component of a multi-disciplinary discharge planning process, led by the attending physician.  Recommendations may be updated based on patient status, additional functional criteria and insurance authorization.    Follow Up Recommendations  Skilled nursing-short term rehab (<3 hours/day)     Assistance Recommended at Discharge Frequent or constant Supervision/Assistance  Patient can return home with the following  A lot of help with bathing/dressing/bathroom;Direct supervision/assist for medications management;Direct supervision/assist for financial management;Assistance with cooking/housework;Assist for transportation;Help with stairs or ramp for entrance;A lot of help with walking and/or transfers   Equipment Recommendations   BSC/3in1    Recommendations for Other Services      Precautions / Restrictions Precautions Precautions: Fall;Other (comment) Precaution Comments: watch HR Restrictions Weight Bearing Restrictions: No       Mobility Bed Mobility Overal bed mobility: Needs Assistance Bed Mobility: Supine to Sit, Sit to Supine, Rolling Rolling: Min assist, Max assist   Supine to sit: Mod assist, HOB elevated Sit to supine: Max assist   General bed mobility comments: assist for trunk and LE management, cues to use trunk and slow movements noted. Difficulty lifitng BLE back onto bed at end of session. assisted to change bed pads to keep pt as dry as possible until sheet changed - Min A to roll to R side with noted poor command following to roll to L side requiring Max A    Transfers                   General transfer comment: unable to attempt with +1 due to poor sitting balance     Balance Overall balance assessment: Needs assistance, History of Falls Sitting-balance support: Bilateral upper extremity supported, Feet supported Sitting balance-Leahy Scale: Poor Sitting balance - Comments: Overall ModA  needed to correct static sitting balance with R lateral lean and posterior lean. attempted to engage in L elbow WB to break R lateral lean though  resistance noted and unable to faciliate Postural control: Posterior lean, Right lateral lean                                 ADL either performed or assessed with clinical judgement   ADL Overall ADL's : Needs assistance/impaired Eating/Feeding: Set up;Sitting;Bed level Eating/Feeding Details (indicate cue type and reason):  to drink from water cup EOB Grooming: Set up;Bed level;Wash/dry face   Upper Body Bathing: Minimal assistance;Bed level Upper Body Bathing Details (indicate cue type and reason): cues for thoroughness but pt able to wash arms, under arms and dry under breasts Lower Body Bathing: Maximal assistance;Bed  level Lower Body Bathing Details (indicate cue type and reason): assist for anterior hygiene Upper Body Dressing : Minimal assistance;Bed level Upper Body Dressing Details (indicate cue type and reason): Assist to change soiled gown and don clean one bed level Lower Body Dressing: Total assistance;Sitting/lateral leans;Bed level Lower Body Dressing Details (indicate cue type and reason): assist for socks EOB due to pt poor balance               General ADL Comments: Pt with coffee spilt on gown on entry and wet bed. Hoped to address ADLs sitting EOB though poor balance noted and unable to safely complete with +1 assist. Assisted back to bed for ADLs as sittingbalance unable to be corrected during session    Extremity/Trunk Assessment Upper Extremity Assessment Upper Extremity Assessment: Generalized weakness;LUE deficits/detail LUE Deficits / Details: question some L inattention as pt with resistance when attempting to cue for L elbow WB to break R lateral lean   Lower Extremity Assessment Lower Extremity Assessment: Defer to PT evaluation        Vision   Vision Assessment?: Vision impaired- to be further tested in functional context Additional Comments: question some L inattention to visual field. inconsistent and poor command following to turn head to look to L out window   Perception     Praxis      Cognition Arousal/Alertness: Awake/alert Behavior During Therapy: Flat affect Overall Cognitive Status: History of cognitive impairments - at baseline                                 General Comments: hx of dementia, more appropriate responses in conversation with some awareness of being in hospital. Able to follow commands for simple tasks, noted resistance when attempting to get pt to lean to L to correct R lateral lean        Exercises      Shoulder Instructions       General Comments called NT during session with room phone though unable to reach. RN  entering during session, aware of pt functional decline and reported plan to get pt to chair today. OT assisted in changing bed linens as much as possible during session though difficult to fully complete without additional assist.    Pertinent Vitals/ Pain       Pain Assessment Pain Assessment: No/denies pain  Home Living                                          Prior Functioning/Environment              Frequency  Min 2X/week        Progress Toward Goals  OT Goals(current goals can now be found in the care plan section)  Progress towards OT goals: Not progressing toward goals - comment (noted decline from eval)  Acute Rehab OT Goals Patient Stated Goal: sit up, get better OT Goal Formulation: With patient Time For Goal Achievement: 08/24/22 Potential to Achieve Goals: Good ADL Goals Pt Will Perform Grooming: with supervision;standing  Pt Will Transfer to Toilet: with supervision;ambulating Pt Will Perform Toileting - Clothing Manipulation and hygiene: with supervision;sitting/lateral leans;sit to/from stand  Plan Discharge plan remains appropriate    Co-evaluation                 AM-PAC OT "6 Clicks" Daily Activity     Outcome Measure   Help from another person eating meals?: A Little Help from another person taking care of personal grooming?: A Little Help from another person toileting, which includes using toliet, bedpan, or urinal?: Total Help from another person bathing (including washing, rinsing, drying)?: A Lot Help from another person to put on and taking off regular upper body clothing?: A Little Help from another person to put on and taking off regular lower body clothing?: Total 6 Click Score: 13    End of Session    OT Visit Diagnosis: Unsteadiness on feet (R26.81);Other abnormalities of gait and mobility (R26.89);Other symptoms and signs involving cognitive function   Activity Tolerance Patient limited by fatigue    Patient Left in bed;with call bell/phone within reach;with bed alarm set   Nurse Communication Mobility status        Time: 2836-6294 OT Time Calculation (min): 32 min  Charges: OT General Charges $OT Visit: 1 Visit OT Treatments $Self Care/Home Management : 8-22 mins $Therapeutic Activity: 8-22 mins  Malachy Chamber, OTR/L Acute Rehab Services Office: [phone]   Layla Maw 08/15/2022, 11:52 AM

## 2022-08-15 NOTE — Progress Notes (Signed)
Physical Therapy Treatment Patient Details Name: Tiffany Black MRN: [id number] DOB: 01/20/1934 Today's Date: 08/15/2022   History of Present Illness 88 yo female presents to [facility] on 11/29 with fall, aphasia. CTH negative for acute findings,  Further workup for CVA/TIA pending. Additionally, pt with encephalopathy and afib. PMHx of dementia, cataracts, strokes in 2015 and 2016, HLD, mild carotid atherosclerotic disease by ultrasound, HTN, insomnia and persistent atrial fibrillation (on Xarelto).    PT Comments    Pt with continued progress towards goals this session. Pt able to come to sitting EOB with mod assist to initiate all mobility with step by step cues for sequencing and physical assist to elevate trunk and scoot to EOB. Pt able to come to stand x3 throughout session with mod assist to power up and cues throughout for upright posture as pt leaning posterior and with heavy reliance on bracing/blocking posterior legs on bed and recliner and requiring up to mod assist to maintain standing balance. Pt able to step pivot to recliner with up to max assist. Pt awake and alert throughout session, engaging in conversation, however pt continues to be limited by impaired cognition and memory deficits.  Current plan remains appropriate to address deficits and maximize functional independence and decrease caregiver burden. Pt continues to benefit from skilled PT services to progress toward functional mobility goals.    Recommendations for follow up therapy are one component of a multi-disciplinary discharge planning process, led by the attending physician.  Recommendations may be updated based on patient status, additional functional criteria and insurance authorization.  Follow Up Recommendations  Skilled nursing-short term rehab (<3 hours/day) Can patient physically be transported by private vehicle: No   Assistance Recommended at Discharge Frequent or constant Supervision/Assistance  Patient can  return home with the following A lot of help with walking and/or transfers;A lot of help with bathing/dressing/bathroom;Assistance with cooking/housework;Direct supervision/assist for medications management;Direct supervision/assist for financial management;Assist for transportation;Help with stairs or ramp for entrance   Equipment Recommendations  None recommended by PT    Recommendations for Other Services       Precautions / Restrictions Precautions Precautions: Fall;Other (comment) Precaution Comments: watch HR Restrictions Weight Bearing Restrictions: No     Mobility  Bed Mobility Overal bed mobility: Needs Assistance Bed Mobility: Supine to Sit     Supine to sit: Mod assist, HOB elevated     General bed mobility comments: assist for trunk and LE management, cues to use trunk and slow movements noted.    Transfers Overall transfer level: Needs assistance Equipment used: Rolling walker (2 wheels), 1 person hand held assist Transfers: Sit to/from Stand, Bed to chair/wheelchair/BSC Sit to Stand: Mod assist, Max assist   Step pivot transfers: Mod assist, Max assist       General transfer comment: max asssit to stand with HHA, mod asssit from recliner with RW, pt with heavy reliance on bracing posterior legs on chair and bed to maintain balance with mod assist,  unable to step away without flexing trunk, mod assist to step pivot to recliner with pt flexing trunk and max assist to complete    Ambulation/Gait             Pre-gait activities: standing weight shifting with RW, mod assist to maintain balance throughout General Gait Details: unable   Stairs             Wheelchair Mobility    Modified Rankin (Stroke Patients Only)       Balance Overall balance assessment:  Needs assistance, History of Falls Sitting-balance support: Bilateral upper extremity supported, Feet supported Sitting balance-Leahy Scale: Poor Sitting balance - Comments: grossly  light min assist to maintain this session, much more awake and alert, some posterior leaning with pt able to correct with cues and assist Postural control: Posterior lean, Right lateral lean Standing balance support: During functional activity, Bilateral upper extremity supported Standing balance-Leahy Scale: Poor Standing balance comment: assist needed to maintain standing balance                            Cognition Arousal/Alertness: Awake/alert Behavior During Therapy: Flat affect Overall Cognitive Status: History of cognitive impairments - at baseline                                 General Comments: hx of dementia, more appropriate responses in conversation with some awareness of being in hospital. Able to follow commands for simple tasks, noted resistance when attempting to get pt to lean to L to correct R lateral lean        Exercises      General Comments General comments (skin integrity, edema, etc.): son present and supportive throughout      Pertinent Vitals/Pain Pain Assessment Pain Assessment: No/denies pain    Home Living                          Prior Function            PT Goals (current goals can now be found in the care plan section) Acute Rehab PT Goals PT Goal Formulation: With patient Time For Goal Achievement: 08/24/22    Frequency    Min 2X/week      PT Plan      Co-evaluation              AM-PAC PT "6 Clicks" Mobility   Outcome Measure  Help needed turning from your back to your side while in a flat bed without using bedrails?: A Lot Help needed moving from lying on your back to sitting on the side of a flat bed without using bedrails?: A Lot Help needed moving to and from a bed to a chair (including a wheelchair)?: A Lot Help needed standing up from a chair using your arms (e.g., wheelchair or bedside chair)?: A Lot Help needed to walk in hospital room?: Total Help needed climbing 3-5 steps  with a railing? : Total 6 Click Score: 10    End of Session Equipment Utilized During Treatment: Gait belt Activity Tolerance: Patient tolerated treatment well Patient left: in chair;with call bell/phone within reach;with chair alarm set;with family/visitor present Nurse Communication: Mobility status PT Visit Diagnosis: Other abnormalities of gait and mobility (R26.89);Muscle weakness (generalized) (M62.81);History of falling (Z91.81)     Time: 4008-6761 PT Time Calculation (min) (ACUTE ONLY): 25 min  Charges:  $Therapeutic Activity: 23-37 mins                     Tiffany Black R. PTA Acute Rehabilitation Services Office: [place] 08/15/2022, 2:00 PM

## 2022-08-15 NOTE — Discharge Summary (Signed)
Physician Discharge Summary  [name] LPF:[id number] DOB: 25-Dec-1933 DOA: 08/09/2022  PCP: Ginger Organ., MD  Admit date: 08/09/2022 Discharge date: 08/15/2022  Admitted From: (Home) Disposition:  (SNF)  Recommendations for Outpatient Follow-up:  Please obtain BMP/CBC in one week    Discharge Condition: (Stable) CODE STATUS: (FULL) Diet recommendation: Regular   Brief/Interim Summary:   Tiffany Black is a 88 y.o. female with medical history significant for persistent atrial fibrillation chronically anticoagulated on Xarelto, essential hypertension, generalized anxiety disorder, dementia, who is admitted to [facility] on 08/09/2022 for further TIA/stroke work-up after presenting from home to [facility] ED for evaluation word finding difficulties.  And altered mental status.  She was seen by neurology, with negative workup, it was felt her findings are related to worsening dementia.    Speech disturbance in the setting of dementia, status post fall CVA/TIA ruled out -She presents with transient focal deficits.  Worsening mental status.-she dementia at baseline, still status appears to be much worsening upon presentation, there is a likelihood of worsening her dementia. -No evidence of infectious etiology -MRI brain with no evidence of acute CVA -PT/OT/SLP consulted -Continue with Xarelto for now -EEG with mild diffuse encephalopathy, no seizures -Preserved EF, intra-atrial septum not well-visualized -Improved on Seroquel, continue with 50 mg milligram at bedtime -Sent appears to be with dementia, as well as significant hospital delirium, she is much improved today, she had a good night sleep, good appetite.   Hypertension -she is back on her home medication, blood pressure remains elevated, so increased  her metoprolol and felodipine dose.   -Continue with Diovan at home dose.     Hyperlipidemia -LDL is elevated at 133, but will hold on starting any statin with total  CK of 893.  He was started on Zetia.  Paroxysmal atrial fibrillation -Continue with Xarelto for anticoagulation -Continue with metoprolol for heart rate control.     Hypokalemia - repleted   Discharge Diagnoses:  Principal Problem:   Aphasia Active Problems:   Paroxysmal atrial fibrillation with RVR (HCC)   Essential hypertension   Atrial fibrillation with RVR (HCC)   Acute encephalopathy   GAD (generalized anxiety disorder)   TIA (transient ischemic attack)    Discharge Instructions  Discharge Instructions     Diet - low sodium heart healthy   Complete by: As directed    Discharge instructions   Complete by: As directed    Follow with Primary MD Ginger Organ., MD /SNF physician  Get CBC, CMP,  checked  by Primary MD next visit.    Activity: As tolerated with Full fall precautions use walker/cane & assistance as needed   Disposition SNF   Diet: Regular diet  On your next visit with your primary care physician please Get Medicines reviewed and adjusted.   Please request your Prim.MD to go over all Hospital Tests and Procedure/Radiological results at the follow up, please get all Hospital records sent to your Prim MD by signing hospital release before you go home.   If you experience worsening of your admission symptoms, develop shortness of breath, life threatening emergency, suicidal or homicidal thoughts you must seek medical attention immediately by calling 911 or calling your MD immediately  if symptoms less severe.  You Must read complete instructions/literature along with all the possible adverse reactions/side effects for all the Medicines you take and that have been prescribed to you. Take any new Medicines after you have completely understood and accpet all the possible adverse  reactions/side effects.   Do not drive, operating heavy machinery, perform activities at heights, swimming or participation in water activities or provide baby sitting  services if your were admitted for syncope or siezures until you have seen by Primary MD or a Neurologist and advised to do so again.  Do not drive when taking Pain medications.    Do not take more than prescribed Pain, Sleep and Anxiety Medications  Special Instructions: If you have smoked or chewed Tobacco  in the last 2 yrs please stop smoking, stop any regular Alcohol  and or any Recreational drug use.  Wear Seat belts while driving.   Please note  You were cared for by a hospitalist during your hospital stay. If you have any questions about your discharge medications or the care you received while you were in the hospital after you are discharged, you can call the unit and asked to speak with the hospitalist on call if the hospitalist that took care of you is not available. Once you are discharged, your primary care physician will handle any further medical issues. Please note that NO REFILLS for any discharge medications will be authorized once you are discharged, as it is imperative that you return to your primary care physician (or establish a relationship with a primary care physician if you do not have one) for your aftercare needs so that they can reassess your need for medications and monitor your lab values.   Increase activity slowly   Complete by: As directed       Allergies as of 08/15/2022       Reactions   Hydrochlorothiazide Hives   Penicillins Hives   Has patient had a PCN reaction causing immediate rash, facial/tongue/throat swelling, SOB or lightheadedness with hypotension: Yes Has patient had a PCN reaction causing severe rash involving mucus membranes or skin necrosis: No Has patient had a PCN reaction that required hospitalization No Has patient had a PCN reaction occurring within the last 10 years: No If all of the above answers are "NO", then may proceed with Cephalosporin use.   Atorvastatin Other (See Comments)   "Weakness"   Ciprofloxacin Other (See  Comments), Hypertension   Blood pressure issues- patient doesn't recall if it increased OR decreased it   Doxycycline Diarrhea, Nausea And Vomiting   Hydrocodone-acetaminophen Other (See Comments)   Reaction not recalled   Iodinated Contrast Media Diarrhea, Other (See Comments), Hypertension   Elevated the B/P   Iodine-131 Other (See Comments), Hypertension   Raised the blood pressure   Latex Itching   Metoprolol Other (See Comments)   Reaction not recalled- is tolerating this in 2023   Statins Other (See Comments)   Muscle weakness   Carbamazepine Other (See Comments), Rash   Flushed blood pressure medication out of system   Codeine Nausea And Vomiting   Nickel Rash   Sulfa Antibiotics Hives, Rash   Sulfamethoxazole Hives, Rash   Sulfonamide Derivatives Hives, Rash           Medication List     TAKE these medications    acetaminophen 325 MG tablet Commonly known as: TYLENOL Take 2 tablets (650 mg total) by mouth every 6 (six) hours as needed for mild pain (or Fever >/= 101). What changed:  medication strength how much to take when to take this reasons to take this   Diovan 160 MG tablet Generic drug: valsartan Take 80 mg by mouth 2 (two) times daily.   ezetimibe 10 MG tablet Commonly known  as: ZETIA Take 1 tablet (10 mg total) by mouth daily. Start taking on: August 16, 2022   felodipine 10 MG 24 hr tablet Commonly known as: PLENDIL Take 1 tablet (10 mg total) by mouth daily. Start taking on: August 16, 2022 What changed:  medication strength how much to take how to take this when to take this additional instructions   FLUoxetine 10 MG capsule Commonly known as: PROZAC Take 10 mg by mouth daily.   Klor-Con M20 20 MEQ tablet Generic drug: potassium chloride SA Take 10 mEq by mouth daily.   Meclizine HCl 25 MG Chew Chew 25 mg by mouth every 8 (eight) hours as needed (for dizziness).   metoprolol tartrate 50 MG tablet Commonly known as:  LOPRESSOR Take 1 tablet (50 mg total) by mouth 2 (two) times daily. What changed: how much to take   QUEtiapine 50 MG tablet Commonly known as: SEROQUEL Take 1 tablet (50 mg total) by mouth at bedtime.   Rivaroxaban 15 MG Tabs tablet Commonly known as: Xarelto Take 1 tablet (15 mg total) by mouth daily with supper.   Tylenol PM Extra Strength 25-500 MG Tabs tablet Generic drug: diphenhydramine-acetaminophen Take 2 tablets by mouth at bedtime.        Follow-up Information     [facility]. Schedule an appointment as soon as possible for a visit in 1 month(s).   Specialty: Neurology Contact information: [place] [address] [phone]               Allergies  Allergen Reactions   Hydrochlorothiazide Hives   Penicillins Hives    Has patient had a PCN reaction causing immediate rash, facial/tongue/throat swelling, SOB or lightheadedness with hypotension: Yes Has patient had a PCN reaction causing severe rash involving mucus membranes or skin necrosis: No Has patient had a PCN reaction that required hospitalization No Has patient had a PCN reaction occurring within the last 10 years: No If all of the above answers are "NO", then may proceed with Cephalosporin use.    Atorvastatin Other (See Comments)    "Weakness"   Ciprofloxacin Other (See Comments) and Hypertension    Blood pressure issues- patient doesn't recall if it increased OR decreased it     Doxycycline Diarrhea and Nausea And Vomiting   Hydrocodone-Acetaminophen Other (See Comments)    Reaction not recalled   Iodinated Contrast Media Diarrhea, Other (See Comments) and Hypertension    Elevated the B/P   Iodine-131 Other (See Comments) and Hypertension    Raised the blood pressure   Latex Itching   Metoprolol Other (See Comments)    Reaction not recalled- is tolerating this in 2023   Statins Other (See Comments)    Muscle weakness    Carbamazepine Other (See Comments) and Rash    Flushed blood pressure medication out of system   Codeine Nausea And Vomiting   Nickel Rash   Sulfa Antibiotics Hives and Rash   Sulfamethoxazole Hives and Rash   Sulfonamide Derivatives Hives and Rash         Consultations: neurology   Procedures/Studies: EEG adult  Result Date: 2022/08/29 Lora Havens, MD     08-29-22 11:33 AM Patient Name: Erza Mothershead MRN: [id number] Epilepsy Attending: Lora Havens Referring Physician/Provider: Rosalin Hawking, MD Date: 08-29-22 Duration: 22.09 mins Patient history: 88 year old female with altered mental status.  EEG to evaluate for seizure. Level of alertness: Awake AEDs during EEG study: None Technical aspects: This EEG  study was done with scalp electrodes positioned according to the 10-20 International system of electrode placement. Electrical activity was reviewed with band pass filter of 1-'70Hz'$ , sensitivity of 7 uV/mm, display speed of 67m/sec with a '60Hz'$  notched filter applied as appropriate. EEG data were recorded continuously and digitally stored.  Video monitoring was available and reviewed as appropriate. Description: The posterior dominant rhythm consists of 8 Hz activity of moderate voltage (25-35 uV) seen predominantly in posterior head regions, symmetric and reactive to eye opening and eye closing. EEG showed intermittent generalized 3 to 5 Hz delta and delta slowing. Hyperventilation and photic stimulation were not performed.   ABNORMALITY - Intermittent slow, generalized IMPRESSION: This study is suggestive of mild diffuse encephalopathy, nonspecific etiology.  No seizures or epileptiform discharges were seen throughout the recording. PLora Havens  ECHOCARDIOGRAM COMPLETE  Result Date: 08/10/2022    ECHOCARDIOGRAM REPORT   Patient Name:   JXANDREA CLAREYDate of Exam: 08/10/2022 Medical Rec #:  [id number]    Height:       68.0 in Accession #:    [id number]   Weight:       152.8 lb  Date of Birth:  204/07/1934    BSA:          1.823 m Patient Age:    89years      BP:           146/87 mmHg Patient Gender: F             HR:           68 bpm. Exam Location:  Inpatient Procedure: 2D Echo Indications:     TIA  History:         Patient has prior history of Echocardiogram examinations, most                  recent 04/10/2017. Arrythmias:Atrial Fibrillation; Risk                  Factors:Hypertension.  Sonographer:     WHarvie JuniorReferring Phys:  [id number] MuraDiagnosing Phys: [facility] Son[facility] Comments: Technically difficult study due to poor echo windows. Patient confused and trying to get up. IMPRESSIONS  1. Left ventricular ejection fraction, by estimation, is 60 to 65%. The left ventricle has normal function. The left ventricle has no regional wall motion abnormalities. There is mild left ventricular hypertrophy. Indeterminate diastolic filling due to E-A fusion.  2. Right ventricular systolic function is normal. The right ventricular size is normal. There is normal pulmonary artery systolic pressure. The estimated right ventricular systolic pressure is 376.2mmHg.  3. No evidence of mitral valve regurgitation.  4. Aortic valve regurgitation is not visualized. Aortic valve sclerosis/calcification is present, without any evidence of aortic stenosis.  5. The inferior vena cava is normal in size with greater than 50% respiratory variability, suggesting right atrial pressure of 3 mmHg. Comparison(s): No prior Echocardiogram. FINDINGS  Left Ventricle: Left ventricular ejection fraction, by estimation, is 60 to 65%. The left ventricle has normal function. The left ventricle has no regional wall motion abnormalities. The left ventricular internal cavity size was normal in size. There is  mild left ventricular hypertrophy. Indeterminate diastolic filling due to E-A fusion. Right Ventricle: The right ventricular size is normal. Right ventricular systolic function is normal. There is  normal pulmonary artery systolic pressure. The tricuspid regurgitant velocity is 2.84 m/s, and with an assumed right atrial pressure of 3  mmHg,  the estimated right ventricular systolic pressure is 97.7 mmHg. Left Atrium: Left atrial size was normal in size. Right Atrium: Right atrial size was normal in size. Pericardium: There is no evidence of pericardial effusion. Mitral Valve: Mild mitral annular calcification. No evidence of mitral valve regurgitation. Tricuspid Valve: Tricuspid valve regurgitation is trivial. Aortic Valve: Aortic valve regurgitation is not visualized. Aortic valve sclerosis/calcification is present, without any evidence of aortic stenosis. Pulmonic Valve: Pulmonic valve regurgitation is not visualized. Aorta: The aortic root and ascending aorta are structurally normal, with no evidence of dilitation. Venous: The inferior vena cava is normal in size with greater than 50% respiratory variability, suggesting right atrial pressure of 3 mmHg. IAS/Shunts: The interatrial septum was not well visualized.  LEFT VENTRICLE PLAX 2D LVIDd:         3.50 cm     Diastology LVIDs:         2.70 cm     LV e' medial:    5.87 cm/s LV PW:         1.20 cm     LV E/e' medial:  13.5 LV IVS:        1.20 cm     LV e' lateral:   12.10 cm/s LVOT diam:     1.90 cm     LV E/e' lateral: 6.6 LV SV:         40 LV SV Index:   22 LVOT Area:     2.84 cm  LV Volumes (MOD) LV vol d, MOD A2C: 54.9 ml LV vol d, MOD A4C: 90.6 ml LV vol s, MOD A2C: 24.9 ml LV vol s, MOD A4C: 35.3 ml LV SV MOD A2C:     30.0 ml LV SV MOD A4C:     90.6 ml LV SV MOD BP:      46.0 ml RIGHT VENTRICLE RV Basal diam:  2.90 cm RV Mid diam:    2.20 cm RV S prime:     10.30 cm/s TAPSE (M-mode): 1.3 cm LEFT ATRIUM             Index        RIGHT ATRIUM           Index LA diam:        3.80 cm 2.08 cm/m   RA Area:     17.90 cm LA Vol (A2C):   43.9 ml 24.09 ml/m  RA Volume:   47.00 ml  25.79 ml/m LA Vol (A4C):   55.9 ml 30.67 ml/m LA Biplane Vol: 54.1 ml 29.68  ml/m  AORTIC VALVE             PULMONIC VALVE LVOT Vmax:   78.50 cm/s  PV Vmax:       0.93 m/s LVOT Vmean:  50.700 cm/s PV Peak grad:  3.4 mmHg LVOT VTI:    0.140 m  AORTA Ao Root diam: 3.30 cm Ao Asc diam:  3.00 cm MITRAL VALVE               TRICUSPID VALVE MV Area (PHT): 3.77 cm    TR Peak grad:   32.3 mmHg MV Decel Time: 201 msec    TR Vmax:        284.00 cm/s MV E velocity: 79.50 cm/s MV A velocity: 26.00 cm/s  SHUNTS MV E/A ratio:  3.06        Systemic VTI:  0.14 m  Systemic Diam: 1.90 cm Phineas Inches Electronically signed by Phineas Inches Signature Date/Time: 08/10/2022/12:59:48 PM    Final (Updated)    [name] CONTRAST  Result Date: 08/10/2022 CLINICAL DATA:  Stroke suspected.  Expressive aphasia. EXAM: MRI HEAD WITHOUT CONTRAST TECHNIQUE: Multiplanar, multiecho pulse sequences of the brain and surrounding structures were obtained without intravenous contrast. COMPARISON:  MRI Brain 05/10/15, CT Head/neck angiogram 08/09/22 FINDINGS: Limitations: Susceptibility weighted imaging was not performed due to patient intolerance. Brain: No acute infarction, hemorrhage, hydrocephalus, extra-axial collection or mass lesion. Sequela of severe chronic microvascular ischemic change with chronic infarct in the left corona radiata. The degree of chronic microvascular ischemic change has markedly progressed compared to 2016. There is also advanced generalized volume loss, which has also progressed compared to 2016. Volume loss likely has mild frontal and temporal lobe predominance Vascular: Normal flow voids. Skull and upper cervical spine: Normal marrow signal. Sinuses/Orbits: Bilateral lens replacement. Mucosal thickening left maxillary, ethmoid, and frontal sinus. No mastoid effusion. Other: None IMPRESSION: Limitations: Susceptibility weighted imaging was not performed due to patient intolerance. 1. No acute intracranial abnormality. 2. Sequela of severe chronic microvascular ischemic  change with chronic infarct in the left corona radiata, progressed compared to 2016. 3. Advanced generalized volume loss, progressed compared to 2016. Volume loss likely has mild frontal and temporal lobe predominance. Electronically Signed   By: Marin Roberts M.D.   On: 08/10/2022 10:02   DG Chest Portable 1 View  Result Date: 08/09/2022 CLINICAL DATA:  Altered level of consciousness EXAM: PORTABLE CHEST 1 VIEW COMPARISON:  05/30/2022 FINDINGS: Single frontal view of the chest demonstrates an enlarged cardiac silhouette, stable. No acute airspace disease, effusion, or pneumothorax. No acute bony abnormalities. IMPRESSION: 1. Stable chest, no acute process. Electronically Signed   By: Randa Ngo M.D.   On: 08/09/2022 21:15   CT ANGIO HEAD NECK W WO CM (CODE STROKE)  Result Date: 08/09/2022 CLINICAL DATA:  Aphasia EXAM: CT ANGIOGRAPHY HEAD AND NECK TECHNIQUE: Multidetector CT imaging of the head and neck was performed using the standard protocol during bolus administration of intravenous contrast. Multiplanar CT image reconstructions and MIPs were obtained to evaluate the vascular anatomy. Carotid stenosis measurements (when applicable) are obtained utilizing NASCET criteria, using the distal internal carotid diameter as the denominator. RADIATION DOSE REDUCTION: This exam was performed according to the departmental dose-optimization program which includes automated exposure control, adjustment of the mA and/or kV according to patient size and/or use of iterative reconstruction technique. CONTRAST:  41m OMNIPAQUE IOHEXOL 350 MG/ML SOLN COMPARISON:  Head CT same day FINDINGS: CTA NECK FINDINGS SKELETON: There is no bony spinal canal stenosis. No lytic or blastic lesion. OTHER NECK: Normal pharynx, larynx and major salivary glands. No cervical lymphadenopathy. Unremarkable thyroid gland. UPPER CHEST: No pneumothorax or pleural effusion. No nodules or masses. AORTIC ARCH: There is no calcific  atherosclerosis of the aortic arch. There is no aneurysm, dissection or hemodynamically significant stenosis of the visualized portion of the aorta. Conventional 3 vessel aortic branching pattern. The visualized proximal subclavian arteries are widely patent. RIGHT CAROTID SYSTEM: Normal without aneurysm, dissection or stenosis. LEFT CAROTID SYSTEM: Normal without aneurysm, dissection or stenosis. VERTEBRAL ARTERIES: Codominant configuration. Both origins are clearly patent. There is no dissection, occlusion or flow-limiting stenosis to the skull base (V1-V3 segments). CTA HEAD FINDINGS POSTERIOR CIRCULATION: --Vertebral arteries: Normal V4 segments. --Inferior cerebellar arteries: Normal. --Basilar artery: Normal. --Superior cerebellar arteries: Normal. --Posterior cerebral arteries (PCA): Normal. ANTERIOR CIRCULATION: --Intracranial internal carotid arteries: Normal. --  Anterior cerebral arteries (ACA): Normal. Both A1 segments are present. Patent anterior communicating artery (a-comm). --Middle cerebral arteries (MCA): Normal. VENOUS SINUSES: As permitted by contrast timing, patent. ANATOMIC VARIANTS: None Review of the MIP images confirms the above findings. IMPRESSION: 1. No emergent large vessel occlusion or high-grade stenosis of the intracranial arteries. 2. Normal CTA of the neck. Electronically Signed   By: Ulyses Jarred M.D.   On: 08/09/2022 20:54   CT HEAD CODE STROKE WO CONTRAST  Result Date: 08/09/2022 CLINICAL DATA:  Code stroke.  Acute neurologic deficit EXAM: CT HEAD WITHOUT CONTRAST TECHNIQUE: Contiguous axial images were obtained from the base of the skull through the vertex without intravenous contrast. RADIATION DOSE REDUCTION: This exam was performed according to the departmental dose-optimization program which includes automated exposure control, adjustment of the mA and/or kV according to patient size and/or use of iterative reconstruction technique. COMPARISON:  07/08/2022 FINDINGS:  Brain: There is no mass, hemorrhage or extra-axial collection. There is generalized atrophy without lobar predilection. There is hypoattenuation of the periventricular white matter, most commonly indicating chronic ischemic microangiopathy. Vascular: No abnormal hyperdensity of the major intracranial arteries or dural venous sinuses. No intracranial atherosclerosis. Skull: The visualized skull base, calvarium and extracranial soft tissues are normal. Sinuses/Orbits: No fluid levels or advanced mucosal thickening of the visualized paranasal sinuses. No mastoid or middle ear effusion. The orbits are normal. ASPECTS [facility] Stroke Program Early CT Score) - Ganglionic level infarction (caudate, lentiform nuclei, internal capsule, insula, M1-M3 cortex): 7 - Supraganglionic infarction (M4-M6 cortex): 3 Total score (0-10 with 10 being normal): 10 IMPRESSION: 1. No acute intracranial abnormality. 2. ASPECTS is 10. These results were communicated to Dr. Kerney Elbe at 8:16 pm on 08/09/2022 by text page via the [facility] messaging system. Electronically Signed   By: Ulyses Jarred M.D.   On: 08/09/2022 20:16     Subjective: No significant events overnight, she had a good night sleep, as discussed with staff she had a good appetite yesterday.   Discharge Exam: Vitals:   08/15/22 0812 08/15/22 1158  BP: 115/88 108/70  Pulse: 92 80  Resp: 18 16  Temp: 98.3 F (36.8 C) 97.8 F (36.6 C)  SpO2: 96% 96%   Vitals:   08/15/22 0349 08/15/22 0443 08/15/22 0812 08/15/22 1158  BP: (!) 146/92  115/88 108/70  Pulse: 81  92 80  Resp: '18  18 16  '$ Temp: 98.8 F (37.1 C)  98.3 F (36.8 C) 97.8 F (36.6 C)  TempSrc: Oral  Oral Oral  SpO2: 97%  96% 96%  Weight:  60.2 kg    Height:        Awake Alert, is more calm person today, appropriate, follows commands Symmetrical Chest wall movement, Good air movement bilaterally, CTAB RRR,No Gallops,Rubs or new Murmurs, No Parasternal Heave +ve B.Sounds, Abd Soft, No  tenderness, No rebound - guarding or rigidity. No Cyanosis, Clubbing or edema, No new Rash or bruise        The results of significant diagnostics from this hospitalization (including imaging, microbiology, ancillary and laboratory) are listed below for reference.     Microbiology: Recent Results (from the past 240 hour(s))  Resp Panel by RT-PCR (Flu A&B, Covid) Anterior Nasal Swab     Status: None   Collection Time: 08/09/22  9:00 PM   Specimen: Anterior Nasal Swab  Result Value Ref Range Status   SARS Coronavirus 2 by RT PCR NEGATIVE NEGATIVE Final    Comment: (NOTE) SARS-CoV-2 target nucleic acids are  NOT DETECTED.  The SARS-CoV-2 RNA is generally detectable in upper respiratory specimens during the acute phase of infection. The lowest concentration of SARS-CoV-2 viral copies this assay can detect is 138 copies/mL. A negative result does not preclude SARS-Cov-2 infection and should not be used as the sole basis for treatment or other patient management decisions. A negative result may occur with  improper specimen collection/handling, submission of specimen other than nasopharyngeal swab, presence of viral mutation(s) within the areas targeted by this assay, and inadequate number of viral copies(<138 copies/mL). A negative result must be combined with clinical observations, patient history, and epidemiological information. The expected result is Negative.  Fact Sheet for Patients:  EntrepreneurPulse.com.au  Fact Sheet for Healthcare Providers:  IncredibleEmployment.be  This test is no t yet approved or cleared by the [place] FDA and  has been authorized for detection and/or diagnosis of SARS-CoV-2 by FDA under an Emergency Use Authorization (EUA). This EUA will remain  in effect (meaning this test can be used) for the duration of the COVID-19 declaration under Section 564(b)(1) of the Act, 21 U.S.C.section 360bbb-3(b)(1), unless  the authorization is terminated  or revoked sooner.       Influenza A by PCR NEGATIVE NEGATIVE Final   Influenza B by PCR NEGATIVE NEGATIVE Final    Comment: (NOTE) The Xpert Xpress SARS-CoV-2/FLU/RSV plus assay is intended as an aid in the diagnosis of influenza from Nasopharyngeal swab specimens and should not be used as a sole basis for treatment. Nasal washings and aspirates are unacceptable for Xpert Xpress SARS-CoV-2/FLU/RSV testing.  Fact Sheet for Patients: EntrepreneurPulse.com.au  Fact Sheet for Healthcare Providers: IncredibleEmployment.be  This test is not yet approved or cleared by the [place] FDA and has been authorized for detection and/or diagnosis of SARS-CoV-2 by FDA under an Emergency Use Authorization (EUA). This EUA will remain in effect (meaning this test can be used) for the duration of the COVID-19 declaration under Section 564(b)(1) of the Act, 21 U.S.C. section 360bbb-3(b)(1), unless the authorization is terminated or revoked.  Performed at [facility], [place] [address], [place], [place] [address]      Labs: BNP (last 3 results) No results for input(s): "BNP" in the last 8760 hours. Basic Metabolic Panel: Recent Labs  Lab 08/09/22 1955 08/09/22 2001 08/10/22 0544 08/11/22 0725 08/12/22 0230 08/15/22 0636  NA 142 141 136 139 139 139  K 3.7 3.5 3.8 3.2* 3.8 3.6  CL 106 104  --  104 103 109  CO2 22  --   --  '26 25 22  '$ GLUCOSE 97 98  --  98 98 102*  BUN 15 17  --  24* 22 54*  CREATININE 1.36* 1.40*  --  1.55* 1.21* 1.71*  CALCIUM 9.6  --   --  9.7 9.9 9.2  MG  --   --   --  2.0 2.1  --   PHOS  --   --   --   --  2.9  --    Liver Function Tests: Recent Labs  Lab 08/09/22 1955 08/11/22 0725  AST 27 49*  ALT 11 18  ALKPHOS 62 54  BILITOT 0.7 1.0  PROT 6.4* 6.1*  ALBUMIN 3.9 3.6   No results for input(s): "LIPASE", "AMYLASE" in the last 168 hours. No results for input(s): "AMMONIA" in  the last 168 hours. CBC: Recent Labs  Lab 08/09/22 1955 08/09/22 2001 08/10/22 0539 08/10/22 0544 08/12/22 0230 08/15/22 0636  WBC 8.4  --  9.0  --  10.9* 9.9  NEUTROABS 5.8  --  8.1*  --   --   --   HGB 13.6 13.3 13.4 14.3 12.8 13.5  HCT 40.2 39.0 40.4 42.0 38.6 40.5  MCV 94.8  --  95.1  --  93.5 92.7  PLT 142*  --  149*  --  140* 182   Cardiac Enzymes: Recent Labs  Lab 08/11/22 0725  CKTOTAL 893*   BNP: Invalid input(s): "POCBNP" CBG: Recent Labs  Lab 08/09/22 1957 08/14/22 1137  GLUCAP 104* 142*   D-Dimer No results for input(s): "DDIMER" in the last 72 hours. Hgb A1c No results for input(s): "HGBA1C" in the last 72 hours. Lipid Profile No results for input(s): "CHOL", "HDL", "LDLCALC", "TRIG", "CHOLHDL", "LDLDIRECT" in the last 72 hours. Thyroid function studies No results for input(s): "TSH", "T4TOTAL", "T3FREE", "THYROIDAB" in the last 72 hours.  Invalid input(s): "FREET3" Anemia work up No results for input(s): "VITAMINB12", "FOLATE", "FERRITIN", "TIBC", "IRON", "RETICCTPCT" in the last 72 hours. Urinalysis    Component Value Date/Time   COLORURINE STRAW (A) 08/09/2022 2100   APPEARANCEUR CLEAR 08/09/2022 2100   LABSPEC 1.036 (H) 08/09/2022 2100   PHURINE 6.0 08/09/2022 2100   GLUCOSEU NEGATIVE 08/09/2022 2100   HGBUR MODERATE (A) 08/09/2022 2100   BILIRUBINUR NEGATIVE 08/09/2022 2100   KETONESUR NEGATIVE 08/09/2022 2100   PROTEINUR NEGATIVE 08/09/2022 2100   UROBILINOGEN 0.2 05/22/2015 1950   NITRITE NEGATIVE 08/09/2022 2100   LEUKOCYTESUR TRACE (A) 08/09/2022 2100   Sepsis Labs Recent Labs  Lab 08/09/22 1955 08/10/22 0539 08/12/22 0230 08/15/22 0636  WBC 8.4 9.0 10.9* 9.9   Microbiology Recent Results (from the past 240 hour(s))  Resp Panel by RT-PCR (Flu A&B, Covid) Anterior Nasal Swab     Status: None   Collection Time: 08/09/22  9:00 PM   Specimen: Anterior Nasal Swab  Result Value Ref Range Status   SARS Coronavirus 2 by RT PCR  NEGATIVE NEGATIVE Final    Comment: (NOTE) SARS-CoV-2 target nucleic acids are NOT DETECTED.  The SARS-CoV-2 RNA is generally detectable in upper respiratory specimens during the acute phase of infection. The lowest concentration of SARS-CoV-2 viral copies this assay can detect is 138 copies/mL. A negative result does not preclude SARS-Cov-2 infection and should not be used as the sole basis for treatment or other patient management decisions. A negative result may occur with  improper specimen collection/handling, submission of specimen other than nasopharyngeal swab, presence of viral mutation(s) within the areas targeted by this assay, and inadequate number of viral copies(<138 copies/mL). A negative result must be combined with clinical observations, patient history, and epidemiological information. The expected result is Negative.  Fact Sheet for Patients:  EntrepreneurPulse.com.au  Fact Sheet for Healthcare Providers:  IncredibleEmployment.be  This test is no t yet approved or cleared by the [place] FDA and  has been authorized for detection and/or diagnosis of SARS-CoV-2 by FDA under an Emergency Use Authorization (EUA). This EUA will remain  in effect (meaning this test can be used) for the duration of the COVID-19 declaration under Section 564(b)(1) of the Act, 21 U.S.C.section 360bbb-3(b)(1), unless the authorization is terminated  or revoked sooner.       Influenza A by PCR NEGATIVE NEGATIVE Final   Influenza B by PCR NEGATIVE NEGATIVE Final    Comment: (NOTE) The Xpert Xpress SARS-CoV-2/FLU/RSV plus assay is intended as an aid in the diagnosis of influenza from Nasopharyngeal swab specimens and should not be used as a  sole basis for treatment. Nasal washings and aspirates are unacceptable for Xpert Xpress SARS-CoV-2/FLU/RSV testing.  Fact Sheet for Patients: EntrepreneurPulse.com.au  Fact Sheet for  Healthcare Providers: IncredibleEmployment.be  This test is not yet approved or cleared by the [place] FDA and has been authorized for detection and/or diagnosis of SARS-CoV-2 by FDA under an Emergency Use Authorization (EUA). This EUA will remain in effect (meaning this test can be used) for the duration of the COVID-19 declaration under Section 564(b)(1) of the Act, 21 U.S.C. section 360bbb-3(b)(1), unless the authorization is terminated or revoked.  Performed at [facility], [place] [address], [place], [place] [address]      Time coordinating discharge: Over 30 minutes  SIGNED:   Phillips Climes, MD  [facility] 08/15/2022, 3:14 PM Pager   If 7PM-7AM, please contact night-coverage [email or web address]

## 2022-08-15 NOTE — Care Management Important Message (Signed)
Important Message  Patient Details  Name: Tiffany Black MRN: [id number] Date of Birth: 05-04-1934   Medicare Important Message Given:  Yes     Orbie Pyo 08/15/2022, 3:21 PM

## 2022-08-15 NOTE — Progress Notes (Signed)
[facility] arrived to pick pt up for transfer to [facility] at this time. Pt belongings gathered, pt placed on stretcher and transported out without difficulty.

## 2022-08-15 NOTE — TOC Transition Note (Signed)
Transition of Care [facility]) - CM/SW Discharge Note   Patient Details  Name: Caly Pellum MRN: [id number] Date of Birth: August 27, 1934  Transition of Care [facility]) CM/SW Contact:  Jinger Neighbors, LCSW Phone Number: 08/15/2022, 3:39 PM   Clinical Narrative:    Pt going to [facility] via [facility] Call to report: [phone], room #114   Final next level of care: [place] Barriers to Discharge: No Barriers Identified   Patient Goals and CMS Choice   CMS Medicare.gov Compare Post Acute Care list provided to:: Patient Represenative (must comment) (patient's son) Choice offered to / list presented to : Adult Children  Discharge Placement              Patient [facility] bed at: [facility] Patient to be transferred to facility by: [place] Name of family member notified: Cicely Ortner Patient and family notified of of transfer: 08/15/22  Discharge Plan and Services                                     Social Determinants of Health (SDOH) Interventions     Readmission Risk Interventions     No data to display

## 2022-08-18 ENCOUNTER — Ambulatory Visit: Payer: Medicare Other | Admitting: Internal Medicine

## 2022-08-22 NOTE — Progress Notes (Deleted)
Cardiology Office Note Date:  08/22/2022  Patient ID:  Tiffany Black, Tiffany Black 08-17-1934, MRN [id number] PCP:  Ginger Organ., MD  Electrophysiologist: Dr. Caryl Comes    Chief Complaint:  follow up ER, palpitations  History of Present Illness: Tiffany Black is a 88 y.o. female with history of permanent AFib, dementia, TIA, CKD (III), orthostatic hypotension, anxiety  She come sin today to be seen for Dr. Caryl Comes, last seen by him Dec 2022, orthostatic symptoms not significantly problematic, AFib was rate controlled.  More recently she saw Jonni Sanger in May 2023, this in follow up to pt calls with concerns though she at the visit could not recall any specifics at least, she was rate controlled and not with any active complaints.  Discussed prior recs for spironolactone, (perhaps 2/2 hypokalemia) though was not on it it seems. In review of phone notes he called in symptoms sounded of her known orthstatic issues.  ER visit 05/30/22 with initial c/o palpitations then dizziness.  Seems she was unagreeable to any meds and some testing as well, declined neuro testing.  Got frustrated with them it seems.     I saw her 06/13/22 She reports doing quite well. She lives with her son, he works at  [employer], mentions another that has recently retired, she is quite proud of them both. She gets the mail and stays busy about the house, but no formal exercise necessarily, likes to walk in the park but has not done that in some time now, because of weather. She denies CP, palpitations or any cardiac concerns today No SOB No falls or fainting No bleeding Denies any dizziness or dizzy spells  She brings a list of medicines that is different the what her pharmacy reports. She wrote her list of medicines from the bottles this AM, so feels like her list is the accurate one. She is forgetful here, asked questions on some things that we had just discussed.  Gets a little frustrated with my questioning her medication  list, assures me she knows what she is doing. Labs a couple weeks ago with mild hypokalemia and reduced H/H from her last Tried to explain the importance and my concern in the discrepancy of medications lists, asked to have her back soon with her medication bottles so we knew what she was taking.  ER visit 07/08/22, c/o dizziness, note a BP 193/139, reported she had not taken her BP pills but had taken her xarelto, given a dose of labetalol, CT head was OK,  and ultimately d/c from the ER, labs were stable  She saw Dr. Brigitte Pulse yesterday, her dementia progressing and medication compliance increasingly challenging and had declined/refused her son's (who she lives with) attempts to help with this.  Dr. Brigitte Pulse reached out to make [place] aware of this.  Asked that we managed her BP and try to assure hat she is compliant with meds, particularly her Xarelto.  I saw her 07/14/22 She is initially alone in the room, but agreeable to have her son come in to help the visit. She admits today that her memory is not what it used to be and agreeable to have her son help with filling her pill box. She brings her medicines today and her pill boxes. She denies any symptoms, no CP Does not recall her EMS/ER trip No reports of syncope by either of them. No SOB No bleeding She brings a written list of medicines from 2021, that does not align with pill bottles.  She has 2 bottles nearly full of fluoxetine, and pill boxes do not have any in them. She doesn't know why, but does not appear to be taking it. Her valsartan bottle is empty, her son says that there are 2 prescriptions ready for them to pick up on the way home.  He believes that is one of them. Her morning pill box does not have any metoprolol in it as far as I can tell. Not sure if there is any valsartan PM box does have xarelto in it There was large discrepancy between the written list that she shows to [place] as her current meds (noted written in 2021) and her hpill  bottles Med boxes missing some meds in them and appeared that she was not at all taking the fluoxetine based on full pill bottles, advised they review this with Dr. Brigitte Pulse to resume/start or not. Long discussion about importance of knowing exactly what her medicines are and good medication compliance. Also discussed OK and not unreasonable that as we age our memory is not as good and everyone needs some help. She seemed agreeable to let her son take over managing her medications and he seemed agreeable to help. Planned to make that transition and see her back in a few weeks to re-assess meds, BP, management.  Admitted 08/09/22 with AMS and word finding difficulties, neuro w/u negative and felt to represent advancing dementia - No evidence of infectious etiology -MRI brain with no evidence of acute CVA -EEG with mild diffuse encephalopathy, no seizures -Preserved EF, intra-atrial septum not well-visualized -Improved on Seroquel, continue with 50 mg milligram at bedtime, struggled with hospital delirium.  Discharged 08/15/22 to SNF  *** permanent SNF? *** xarelto, bleeding, dose, labs *** rate   Past Medical History:  Diagnosis Date   Anxiety    Arthritis    Bulging of cervical intervertebral disc    Cardiomegaly    Cataracts, bilateral    Chronic neck pain    History of endometrial cancer    History of stroke    2015 and 2016 // carotid US 8/16: Bilateral 1-39 // carotid US 2/16: Bilateral ICA 1-39   HLD (hyperlipidemia)    Hx of echocardiogram    a. Echo (11/15): Mild LVH, EF 55-60%, normal wall motion, grade 2 diastolic dysfunction, mild LAE, normal RV function, PASP 29 mm Hg // echo 8/16:Mild concentric LVH, EF 55-60, normal wall motion, grade 2 diastolic dysfunction, mild LAE, PASP 39   Hypertension    Insomnia    Lung nodule    OA (osteoarthritis)    Pectus excavatum    Persistent atrial fibrillation (HCC)    Vertigo     Past Surgical History:  Procedure Laterality Date    ABDOMINAL HYSTERECTOMY  2011   CATARACT EXTRACTION, BILATERAL Bilateral 2009   CESAREAN SECTION  1975   LAPAROSCOPIC HYSTERECTOMY  2011    Current Outpatient Medications  Medication Sig Dispense Refill   acetaminophen (TYLENOL) 325 MG tablet Take 2 tablets (650 mg total) by mouth every 6 (six) hours as needed for mild pain (or Fever >/= 101).     DIOVAN 160 MG tablet Take 80 mg by mouth 2 (two) times daily.     ezetimibe (ZETIA) 10 MG tablet Take 1 tablet (10 mg total) by mouth daily.     felodipine (PLENDIL) 10 MG 24 hr tablet Take 1 tablet (10 mg total) by mouth daily.     FLUoxetine (PROZAC) 10 MG capsule Take 10 mg by mouth  daily.     KLOR-CON M20 20 MEQ tablet Take 10 mEq by mouth daily.     Meclizine HCl 25 MG CHEW Chew 25 mg by mouth every 8 (eight) hours as needed (for dizziness).     metoprolol tartrate (LOPRESSOR) 50 MG tablet Take 1 tablet (50 mg total) by mouth 2 (two) times daily.     QUEtiapine (SEROQUEL) 50 MG tablet Take 1 tablet (50 mg total) by mouth at bedtime.     Rivaroxaban (XARELTO) 15 MG TABS tablet Take 1 tablet (15 mg total) by mouth daily with supper. 30 tablet 5   TYLENOL PM EXTRA STRENGTH 500-25 MG TABS tablet Take 2 tablets by mouth at bedtime.     No current facility-administered medications for this visit.    Allergies:   Hydrochlorothiazide, Penicillins, Atorvastatin, Ciprofloxacin, Doxycycline, Hydrocodone-acetaminophen, Iodinated contrast media, Iodine-131, Latex, Metoprolol, Statins, Carbamazepine, Codeine, Nickel, Sulfa antibiotics, Sulfamethoxazole, and Sulfonamide derivatives   Social History:  The patient  reports that she has never smoked. She has never used smokeless tobacco. She reports that she does not drink alcohol and does not use drugs.   Family History:  The patient's family history includes Dementia in her mother; Heart attack in her father; Heart disease in her father; Stroke in her maternal uncle.  ROS:  Please see the history of  present illness.    All other systems are reviewed and otherwise negative.   PHYSICAL EXAM:  VS:  There were no vitals taken for this visit. BMI: There is no height or weight on file to calculate BMI. Well nourished, well developed, in no acute distress HEENT: normocephalic, atraumatic Neck: no JVD, carotid bruits or masses Cardiac:   *** irreg-irreg; no significant murmurs, no rubs, or gallops Lungs:  *** CTA b/l, no wheezing, rhonchi or rales Abd: soft, nontender MS: no deformity or atrophy Ext: *** no edema Skin: warm and dry, no rash Neuro:  No gross deficits appreciated Psych: euthymic mood, full affect    EKG:  not done today 08/09/22: personally reviewed: AFib 130bpm Discharge vitals note HR 80bpm  08/10/22: TTE 1. Left ventricular ejection fraction, by estimation, is 60 to 65%. The  left ventricle has normal function. The left ventricle has no regional  wall motion abnormalities. There is mild left ventricular hypertrophy.  Indeterminate diastolic filling due to  E-A fusion.   2. Right ventricular systolic function is normal. The right ventricular  size is normal. There is normal pulmonary artery systolic pressure. The  estimated right ventricular systolic pressure is 67.5 mmHg.   3. No evidence of mitral valve regurgitation.   4. Aortic valve regurgitation is not visualized. Aortic valve  sclerosis/calcification is present, without any evidence of aortic  stenosis.   5. The inferior vena cava is normal in size with greater than 50%  respiratory variability, suggesting right atrial pressure of 3 mmHg.   Comparison(s): No prior Echocardiogram.   04/10/2017: TTE Study Conclusions  - Procedure narrative: Transthoracic echocardiography. Image    quality was adequate. Intravenous contrast (Definity) was    administered.  - Left ventricle: The cavity size was normal. Wall thickness was    increased in a pattern of mild LVH. Systolic function was normal.    The  estimated ejection fraction was in the range of 55% to 60%.    Wall motion was normal; there were no regional wall motion    abnormalities.  - Mitral valve: Calcified annulus.  - Left atrium: The atrium was mildly dilated.  -  Right atrium: The atrium was mildly dilated.  - Pulmonary arteries: Systolic pressure was mildly increased. PA    peak pressure: 40 mm Hg (S).   Impressions:  - Definity used; normal LV systolic function; mild LVH; mild    biatrial enlargement; mild TR with mildly elevated pulmonary    pressure.    Recent Labs: 08/11/2022: ALT 18; TSH 0.857 08/12/2022: Magnesium 2.1 08/15/2022: BUN 54; Creatinine, Ser 1.71; Hemoglobin 13.5; Platelets 182; Potassium 3.6; Sodium 139  08/11/2022: Cholesterol 189; HDL 33; LDL Cholesterol 139; Total CHOL/HDL Ratio 5.7; Triglycerides 83; VLDL 17   Estimated Creatinine Clearance: 22.4 mL/min (A) (by C-G formula based on SCr of 1.71 mg/dL (H)).   Wt Readings from Last 3 Encounters:  08/15/22 132 lb 11.5 oz (60.2 kg)  07/14/22 147 lb 9.6 oz (67 kg)  07/08/22 151 lb 14.4 oz (68.9 kg)     Other studies reviewed: Additional studies/records reviewed today include: summarized above  ASSESSMENT AND PLAN:  Permanent Afib  CHA2DS2Vasc is 7, on Xarelto, *** appropriately dosed Permanent *** rate controlled   HTN  W/Orthostatic dizziness and hypotension also mentioned in her hx *** No near syncope or syncope. *** She has had high BPs *** Today's looks good.       Disposition: ***   Current medicines are reviewed at length with the patient today.  The patient did not have any concerns regarding medicines.  Venetia Night, PA-C 08/22/2022 8:38 AM     [place]  [phone] (office)  [phone] (fax)

## 2022-08-24 ENCOUNTER — Ambulatory Visit: Payer: Medicare Other | Admitting: Physician Assistant

## 2022-08-26 ENCOUNTER — Emergency Department (HOSPITAL_COMMUNITY): Payer: Medicare Other

## 2022-08-26 ENCOUNTER — Other Ambulatory Visit: Payer: Self-pay

## 2022-08-26 ENCOUNTER — Inpatient Hospital Stay (HOSPITAL_COMMUNITY)
Admission: EM | Admit: 2022-08-26 | Discharge: 2022-08-30 | DRG: 177 | Disposition: A | Discharge status: Skilled Nursing Facility | Payer: Medicare Other | Source: Skilled Nursing Facility | Attending: Internal Medicine | Admitting: Internal Medicine

## 2022-08-26 DIAGNOSIS — Z881 Allergy status to other antibiotic agents status: Secondary | ICD-10-CM

## 2022-08-26 DIAGNOSIS — I480 Paroxysmal atrial fibrillation: Secondary | ICD-10-CM | POA: Diagnosis present

## 2022-08-26 DIAGNOSIS — I69320 Aphasia following cerebral infarction: Secondary | ICD-10-CM

## 2022-08-26 DIAGNOSIS — J1282 Pneumonia due to coronavirus disease 2019: Secondary | ICD-10-CM | POA: Diagnosis present

## 2022-08-26 DIAGNOSIS — N1831 Chronic kidney disease, stage 3a: Secondary | ICD-10-CM | POA: Diagnosis present

## 2022-08-26 DIAGNOSIS — R531 Weakness: Secondary | ICD-10-CM

## 2022-08-26 DIAGNOSIS — Z79899 Other long term (current) drug therapy: Secondary | ICD-10-CM

## 2022-08-26 DIAGNOSIS — E785 Hyperlipidemia, unspecified: Secondary | ICD-10-CM | POA: Diagnosis present

## 2022-08-26 DIAGNOSIS — Z91040 Latex allergy status: Secondary | ICD-10-CM

## 2022-08-26 DIAGNOSIS — I129 Hypertensive chronic kidney disease with stage 1 through stage 4 chronic kidney disease, or unspecified chronic kidney disease: Secondary | ICD-10-CM | POA: Diagnosis present

## 2022-08-26 DIAGNOSIS — E222 Syndrome of inappropriate secretion of antidiuretic hormone: Secondary | ICD-10-CM | POA: Diagnosis present

## 2022-08-26 DIAGNOSIS — G9341 Metabolic encephalopathy: Secondary | ICD-10-CM | POA: Diagnosis present

## 2022-08-26 DIAGNOSIS — U071 COVID-19: Principal | ICD-10-CM | POA: Diagnosis present

## 2022-08-26 DIAGNOSIS — Z880 Allergy status to penicillin: Secondary | ICD-10-CM

## 2022-08-26 DIAGNOSIS — I10 Essential (primary) hypertension: Secondary | ICD-10-CM | POA: Diagnosis present

## 2022-08-26 DIAGNOSIS — Z885 Allergy status to narcotic agent status: Secondary | ICD-10-CM

## 2022-08-26 DIAGNOSIS — Z91041 Radiographic dye allergy status: Secondary | ICD-10-CM

## 2022-08-26 DIAGNOSIS — Z888 Allergy status to other drugs, medicaments and biological substances status: Secondary | ICD-10-CM

## 2022-08-26 DIAGNOSIS — E871 Hypo-osmolality and hyponatremia: Secondary | ICD-10-CM | POA: Diagnosis present

## 2022-08-26 DIAGNOSIS — I4819 Other persistent atrial fibrillation: Secondary | ICD-10-CM | POA: Diagnosis present

## 2022-08-26 DIAGNOSIS — D638 Anemia in other chronic diseases classified elsewhere: Secondary | ICD-10-CM | POA: Diagnosis present

## 2022-08-26 DIAGNOSIS — Z8542 Personal history of malignant neoplasm of other parts of uterus: Secondary | ICD-10-CM

## 2022-08-26 DIAGNOSIS — D631 Anemia in chronic kidney disease: Secondary | ICD-10-CM | POA: Diagnosis present

## 2022-08-26 DIAGNOSIS — F0390 Unspecified dementia without behavioral disturbance: Secondary | ICD-10-CM | POA: Diagnosis present

## 2022-08-26 DIAGNOSIS — N179 Acute kidney failure, unspecified: Secondary | ICD-10-CM | POA: Diagnosis present

## 2022-08-26 DIAGNOSIS — Z823 Family history of stroke: Secondary | ICD-10-CM

## 2022-08-26 DIAGNOSIS — I444 Left anterior fascicular block: Secondary | ICD-10-CM | POA: Diagnosis present

## 2022-08-26 DIAGNOSIS — J9601 Acute respiratory failure with hypoxia: Secondary | ICD-10-CM | POA: Diagnosis present

## 2022-08-26 DIAGNOSIS — Z882 Allergy status to sulfonamides status: Secondary | ICD-10-CM

## 2022-08-26 DIAGNOSIS — Z7901 Long term (current) use of anticoagulants: Secondary | ICD-10-CM

## 2022-08-26 DIAGNOSIS — Z8249 Family history of ischemic heart disease and other diseases of the circulatory system: Secondary | ICD-10-CM

## 2022-08-26 DIAGNOSIS — W19XXXA Unspecified fall, initial encounter: Secondary | ICD-10-CM

## 2022-08-26 LAB — CBC WITH DIFFERENTIAL/PLATELET
Abs Immature Granulocytes: 0.03 10*3/uL (ref 0.00–0.07)
Basophils Absolute: 0 10*3/uL (ref 0.0–0.1)
Basophils Relative: 0 %
Eosinophils Absolute: 0 10*3/uL (ref 0.0–0.5)
Eosinophils Relative: 0 %
HCT: 34.4 % — ABNORMAL LOW (ref 36.0–46.0)
Hemoglobin: 11.6 g/dL — ABNORMAL LOW (ref 12.0–15.0)
Immature Granulocytes: 1 %
Lymphocytes Relative: 10 %
Lymphs Abs: 0.5 10*3/uL — ABNORMAL LOW (ref 0.7–4.0)
MCH: 31.5 pg (ref 26.0–34.0)
MCHC: 33.7 g/dL (ref 30.0–36.0)
MCV: 93.5 fL (ref 80.0–100.0)
Monocytes Absolute: 0.5 10*3/uL (ref 0.1–1.0)
Monocytes Relative: 10 %
Neutro Abs: 3.7 10*3/uL (ref 1.7–7.7)
Neutrophils Relative %: 79 %
Platelets: 156 10*3/uL (ref 150–400)
RBC: 3.68 MIL/uL — ABNORMAL LOW (ref 3.87–5.11)
RDW: 13.4 % (ref 11.5–15.5)
WBC: 4.6 10*3/uL (ref 4.0–10.5)
nRBC: 0 % (ref 0.0–0.2)

## 2022-08-26 LAB — BASIC METABOLIC PANEL
Anion gap: 11 (ref 5–15)
BUN: 13 mg/dL (ref 8–23)
CO2: 22 mmol/L (ref 22–32)
Calcium: 8.6 mg/dL — ABNORMAL LOW (ref 8.9–10.3)
Chloride: 100 mmol/L (ref 98–111)
Creatinine, Ser: 1.11 mg/dL — ABNORMAL HIGH (ref 0.44–1.00)
GFR, Estimated: 48 mL/min — ABNORMAL LOW (ref >60)
Glucose, Bld: 140 mg/dL — ABNORMAL HIGH (ref 70–99)
Potassium: 4 mmol/L (ref 3.5–5.1)
Sodium: 133 mmol/L — ABNORMAL LOW (ref 135–145)

## 2022-08-26 LAB — RESP PANEL BY RT-PCR (RSV, FLU A&B, COVID)  RVPGX2
Influenza A by PCR: NEGATIVE
Influenza B by PCR: NEGATIVE
Resp Syncytial Virus by PCR: NEGATIVE
SARS Coronavirus 2 by RT PCR: POSITIVE — AB

## 2022-08-26 MED ORDER — SODIUM CHLORIDE 0.9 % IV SOLN
1.0000 g | INTRAVENOUS | Status: DC
  Administered 2022-08-27 (×2): 1 g via INTRAVENOUS
  Filled 2022-08-26 (×2): qty 10

## 2022-08-26 MED ORDER — SODIUM CHLORIDE 0.9 % IV SOLN
500.0000 mg | INTRAVENOUS | Status: DC
  Administered 2022-08-27 (×2): 500 mg via INTRAVENOUS
  Filled 2022-08-26 (×3): qty 5

## 2022-08-26 MED ORDER — LACTATED RINGERS IV SOLN: INTRAVENOUS | Status: DC

## 2022-08-26 MED ORDER — METOPROLOL TARTRATE 5 MG/5ML IV SOLN
5.0000 mg | Freq: Once | INTRAVENOUS | Status: AC
  Administered 2022-08-26: 5 mg via INTRAVENOUS
  Filled 2022-08-26: qty 5

## 2022-08-26 NOTE — ED Triage Notes (Signed)
Pt in from [facility] via [facility] with callout for sob and upper back pain. EMS states she is Covid+, and facility states she had a fall earlier in the morning today - takes Xarelto, denies any injuries and pt has dementia, some confusion at baseline. Pt does not remember falling. En route, HR between 120-160's afib (hx of). Given 367m NS. Denies any cp at this time.   VS en route: 158/87 140HR 94% RA 22RR

## 2022-08-26 NOTE — ED Provider Notes (Signed)
[facility] EMERGENCY DEPARTMENT Provider Note   CSN: [id number] Arrival date & time: 08/26/22  2113     History  Chief Complaint  Patient presents with   Shortness of Breath   Tachycardia    Tiffany Black is a 88 y.o. female.  88 year old female presents from nursing facility due to shortness of breath and upper back pain.  Patient was diagnosed recently with COVID.  Had an unwitnessed fall earlier today.  She is on Xarelto.  Patient has a history of dementia.  Patient has some confusion at baseline.  Patient has history of A-fib with heart rates between 120-150's.  Patient denies any vomiting or diarrhea.  No urinary symptoms.  Given 300 cc of saline prior to arrival.       Home Medications Prior to Admission medications   Medication Sig Start Date End Date Taking? Authorizing Provider  acetaminophen (TYLENOL) 325 MG tablet Take 2 tablets (650 mg total) by mouth every 6 (six) hours as needed for mild pain (or Fever >/= 101). 08/15/22   Elgergawy, Silver Huguenin, MD  DIOVAN 160 MG tablet Take 80 mg by mouth 2 (two) times daily. 02/16/21   [provider]  ezetimibe (ZETIA) 10 MG tablet Take 1 tablet (10 mg total) by mouth daily. 08/16/22   Elgergawy, Silver Huguenin, MD  felodipine (PLENDIL) 10 MG 24 hr tablet Take 1 tablet (10 mg total) by mouth daily. 08/16/22   Elgergawy, Silver Huguenin, MD  FLUoxetine (PROZAC) 10 MG capsule Take 10 mg by mouth daily.    [provider]  KLOR-CON M20 20 MEQ tablet Take 10 mEq by mouth daily.    [provider]  Meclizine HCl 25 MG CHEW Chew 25 mg by mouth every 8 (eight) hours as needed (for dizziness). 06/03/21   [provider]  metoprolol tartrate (LOPRESSOR) 50 MG tablet Take 1 tablet (50 mg total) by mouth 2 (two) times daily. 08/15/22   Elgergawy, Silver Huguenin, MD  QUEtiapine (SEROQUEL) 50 MG tablet Take 1 tablet (50 mg total) by mouth at bedtime. 08/15/22   Elgergawy, Silver Huguenin, MD  Rivaroxaban (XARELTO) 15 MG  TABS tablet Take 1 tablet (15 mg total) by mouth daily with supper. 03/06/22   Deboraha Sprang, MD  TYLENOL PM EXTRA STRENGTH 500-25 MG TABS tablet Take 2 tablets by mouth at bedtime.    [provider]      Allergies    Hydrochlorothiazide, Penicillins, Atorvastatin, Ciprofloxacin, Doxycycline, Hydrocodone-acetaminophen, Iodinated contrast media, Iodine-131, Latex, Metoprolol, Statins, Carbamazepine, Codeine, Nickel, Sulfa antibiotics, Sulfamethoxazole, and Sulfonamide derivatives    Review of Systems   Review of Systems  All other systems reviewed and are negative.   Physical Exam Updated Vital Signs BP (!) 163/94 (BP Location: Right Arm)   Pulse (!) 120   Temp 99 F (37.2 C) (Oral)   Resp 12   Wt 60.2 kg   SpO2 94%   BMI 20.18 kg/m  Physical Exam Vitals and nursing note reviewed.  Constitutional:      General: She is not in acute distress.    Appearance: Normal appearance. She is well-developed. She is not toxic-appearing.  HENT:     Head: Normocephalic and atraumatic.  Eyes:     General: Lids are normal.     Conjunctiva/sclera: Conjunctivae normal.     Pupils: Pupils are equal, round, and reactive to light.  Neck:     Thyroid: No thyroid mass.     Trachea: No tracheal deviation.  Cardiovascular:  Rate and Rhythm: Tachycardia present. Rhythm irregular.     Heart sounds: Normal heart sounds. No murmur heard.    No gallop.  Pulmonary:     Effort: Pulmonary effort is normal. No respiratory distress.     Breath sounds: No stridor. Examination of the right-upper field reveals decreased breath sounds. Examination of the left-upper field reveals decreased breath sounds. Decreased breath sounds present. No wheezing, rhonchi or rales.  Abdominal:     General: There is no distension.     Palpations: Abdomen is soft.     Tenderness: There is no abdominal tenderness. There is no rebound.  Musculoskeletal:        General: No tenderness. Normal range of motion.      Cervical back: Normal range of motion and neck supple.  Skin:    General: Skin is warm and dry.     Findings: No abrasion or rash.  Neurological:     Mental Status: She is alert and oriented to person, place, and time. Mental status is at baseline.     GCS: GCS eye subscore is 4. GCS verbal subscore is 5. GCS motor subscore is 6.     Cranial Nerves: No cranial nerve deficit.     Sensory: No sensory deficit.     Motor: Motor function is intact.  Psychiatric:        Attention and Perception: Attention normal.        Speech: Speech normal.        Behavior: Behavior normal.     ED Results / Procedures / Treatments   Labs (all labs ordered are listed, but only abnormal results are displayed) Labs Reviewed - No data to display  EKG EKG Interpretation  Date/Time:  Saturday August 26 2022 21:15:11 EST Ventricular Rate:  136 PR Interval:    QRS Duration: 93 QT Interval:  306 QTC Calculation: 461 R Axis:   -58 Text Interpretation: Atrial fibrillation Left anterior fascicular block Abnormal R-wave progression, late transition LVH with secondary repolarization abnormality Confirmed by Lacretia Leigh ([id number]) on 08/26/2022 9:18:28 PM  Radiology No results found.  Procedures Procedures    Medications Ordered in ED Medications  lactated ringers infusion (has no administration in time range)  metoprolol tartrate (LOPRESSOR) injection 5 mg (has no administration in time range)    ED Course/ Medical Decision Making/ A&P                           Medical Decision Making Amount and/or Complexity of Data Reviewed Labs: ordered. Radiology: ordered.  Risk Prescription drug management.   Patient is EKG from interpretation shows A-fib with rapid ventricular response.  Was given 5 mg of Lopressor and pause improvement with her heart rate.  Her COVID test here is positive.  X-ray of her chest possible pneumonia.  Lactate and blood cultures pending at this time.  Started on IV  antibiotics.  Has history of unwitnessed fall and is on Eliquis and head CT is pending at this time.  Patient will require admission.  Care turned over to Dr. Tyrone Nine        Final Clinical Impression(s) / ED Diagnoses Final diagnoses:  None    Rx / DC Orders ED Discharge Orders     None         Lacretia Leigh, MD 08/26/22 2325

## 2022-08-26 NOTE — ED Notes (Signed)
Pt placed on 2LNC, as sats down to 87% RA.

## 2022-08-27 ENCOUNTER — Encounter (HOSPITAL_COMMUNITY): Payer: Self-pay | Admitting: Internal Medicine

## 2022-08-27 DIAGNOSIS — Z91041 Radiographic dye allergy status: Secondary | ICD-10-CM | POA: Diagnosis not present

## 2022-08-27 DIAGNOSIS — N1831 Chronic kidney disease, stage 3a: Secondary | ICD-10-CM | POA: Diagnosis present

## 2022-08-27 DIAGNOSIS — I10 Essential (primary) hypertension: Secondary | ICD-10-CM

## 2022-08-27 DIAGNOSIS — E222 Syndrome of inappropriate secretion of antidiuretic hormone: Secondary | ICD-10-CM | POA: Diagnosis present

## 2022-08-27 DIAGNOSIS — I444 Left anterior fascicular block: Secondary | ICD-10-CM | POA: Diagnosis present

## 2022-08-27 DIAGNOSIS — Z823 Family history of stroke: Secondary | ICD-10-CM | POA: Diagnosis not present

## 2022-08-27 DIAGNOSIS — U071 COVID-19: Secondary | ICD-10-CM | POA: Diagnosis present

## 2022-08-27 DIAGNOSIS — Y92009 Unspecified place in unspecified non-institutional (private) residence as the place of occurrence of the external cause: Secondary | ICD-10-CM

## 2022-08-27 DIAGNOSIS — I480 Paroxysmal atrial fibrillation: Secondary | ICD-10-CM

## 2022-08-27 DIAGNOSIS — Z881 Allergy status to other antibiotic agents status: Secondary | ICD-10-CM | POA: Diagnosis not present

## 2022-08-27 DIAGNOSIS — I4819 Other persistent atrial fibrillation: Secondary | ICD-10-CM | POA: Diagnosis present

## 2022-08-27 DIAGNOSIS — Z8542 Personal history of malignant neoplasm of other parts of uterus: Secondary | ICD-10-CM | POA: Diagnosis not present

## 2022-08-27 DIAGNOSIS — Z91040 Latex allergy status: Secondary | ICD-10-CM | POA: Diagnosis not present

## 2022-08-27 DIAGNOSIS — Z888 Allergy status to other drugs, medicaments and biological substances status: Secondary | ICD-10-CM | POA: Diagnosis not present

## 2022-08-27 DIAGNOSIS — J9601 Acute respiratory failure with hypoxia: Secondary | ICD-10-CM | POA: Diagnosis present

## 2022-08-27 DIAGNOSIS — Z885 Allergy status to narcotic agent status: Secondary | ICD-10-CM | POA: Diagnosis not present

## 2022-08-27 DIAGNOSIS — E871 Hypo-osmolality and hyponatremia: Secondary | ICD-10-CM | POA: Diagnosis not present

## 2022-08-27 DIAGNOSIS — Z8249 Family history of ischemic heart disease and other diseases of the circulatory system: Secondary | ICD-10-CM | POA: Diagnosis not present

## 2022-08-27 DIAGNOSIS — D638 Anemia in other chronic diseases classified elsewhere: Secondary | ICD-10-CM | POA: Diagnosis not present

## 2022-08-27 DIAGNOSIS — Z880 Allergy status to penicillin: Secondary | ICD-10-CM | POA: Diagnosis not present

## 2022-08-27 DIAGNOSIS — Z882 Allergy status to sulfonamides status: Secondary | ICD-10-CM | POA: Diagnosis not present

## 2022-08-27 DIAGNOSIS — W19XXXA Unspecified fall, initial encounter: Secondary | ICD-10-CM

## 2022-08-27 DIAGNOSIS — F0390 Unspecified dementia without behavioral disturbance: Secondary | ICD-10-CM | POA: Diagnosis present

## 2022-08-27 DIAGNOSIS — I69320 Aphasia following cerebral infarction: Secondary | ICD-10-CM | POA: Diagnosis not present

## 2022-08-27 DIAGNOSIS — I129 Hypertensive chronic kidney disease with stage 1 through stage 4 chronic kidney disease, or unspecified chronic kidney disease: Secondary | ICD-10-CM | POA: Diagnosis present

## 2022-08-27 DIAGNOSIS — Z79899 Other long term (current) drug therapy: Secondary | ICD-10-CM | POA: Diagnosis not present

## 2022-08-27 DIAGNOSIS — G9341 Metabolic encephalopathy: Secondary | ICD-10-CM | POA: Diagnosis present

## 2022-08-27 DIAGNOSIS — E785 Hyperlipidemia, unspecified: Secondary | ICD-10-CM | POA: Diagnosis present

## 2022-08-27 DIAGNOSIS — J1282 Pneumonia due to coronavirus disease 2019: Secondary | ICD-10-CM | POA: Diagnosis present

## 2022-08-27 DIAGNOSIS — N179 Acute kidney failure, unspecified: Secondary | ICD-10-CM | POA: Diagnosis present

## 2022-08-27 DIAGNOSIS — R531 Weakness: Secondary | ICD-10-CM

## 2022-08-27 DIAGNOSIS — D631 Anemia in chronic kidney disease: Secondary | ICD-10-CM | POA: Diagnosis present

## 2022-08-27 LAB — BLOOD CULTURE ID PANEL (REFLEXED) - BCID2

## 2022-08-27 LAB — CBC WITH DIFFERENTIAL/PLATELET
Abs Immature Granulocytes: 0.01 10*3/uL (ref 0.00–0.07)
Basophils Absolute: 0 10*3/uL (ref 0.0–0.1)
Basophils Relative: 0 %
Eosinophils Absolute: 0 10*3/uL (ref 0.0–0.5)
Eosinophils Relative: 0 %
HCT: 33.8 % — ABNORMAL LOW (ref 36.0–46.0)
Hemoglobin: 11.1 g/dL — ABNORMAL LOW (ref 12.0–15.0)
Immature Granulocytes: 0 %
Lymphocytes Relative: 14 %
Lymphs Abs: 0.5 10*3/uL — ABNORMAL LOW (ref 0.7–4.0)
MCH: 31.4 pg (ref 26.0–34.0)
MCHC: 32.8 g/dL (ref 30.0–36.0)
MCV: 95.8 fL (ref 80.0–100.0)
Monocytes Absolute: 0.5 10*3/uL (ref 0.1–1.0)
Monocytes Relative: 13 %
Neutro Abs: 2.8 10*3/uL (ref 1.7–7.7)
Neutrophils Relative %: 73 %
Platelets: 151 10*3/uL (ref 150–400)
RBC: 3.53 MIL/uL — ABNORMAL LOW (ref 3.87–5.11)
RDW: 13.5 % (ref 11.5–15.5)
WBC: 3.9 10*3/uL — ABNORMAL LOW (ref 4.0–10.5)
nRBC: 0 % (ref 0.0–0.2)

## 2022-08-27 LAB — COMPREHENSIVE METABOLIC PANEL
ALT: 22 U/L (ref 0–44)
AST: 32 U/L (ref 15–41)
Albumin: 3.2 g/dL — ABNORMAL LOW (ref 3.5–5.0)
Alkaline Phosphatase: 51 U/L (ref 38–126)
Anion gap: 12 (ref 5–15)
BUN: 10 mg/dL (ref 8–23)
CO2: 21 mmol/L — ABNORMAL LOW (ref 22–32)
Calcium: 8 mg/dL — ABNORMAL LOW (ref 8.9–10.3)
Chloride: 99 mmol/L (ref 98–111)
Creatinine, Ser: 0.89 mg/dL (ref 0.44–1.00)
GFR, Estimated: >60 mL/min (ref >60)
Glucose, Bld: 116 mg/dL — ABNORMAL HIGH (ref 70–99)
Potassium: 3.4 mmol/L — ABNORMAL LOW (ref 3.5–5.1)
Sodium: 132 mmol/L — ABNORMAL LOW (ref 135–145)
Total Bilirubin: 0.4 mg/dL (ref 0.3–1.2)
Total Protein: 5.7 g/dL — ABNORMAL LOW (ref 6.5–8.1)

## 2022-08-27 LAB — MAGNESIUM: Magnesium: 1.5 mg/dL — ABNORMAL LOW (ref 1.7–2.4)

## 2022-08-27 LAB — URINALYSIS, ROUTINE W REFLEX MICROSCOPIC
Bilirubin Urine: NEGATIVE
Glucose, UA: NEGATIVE mg/dL
Hgb urine dipstick: NEGATIVE
Ketones, ur: NEGATIVE mg/dL
Leukocytes,Ua: NEGATIVE
Nitrite: NEGATIVE
Protein, ur: NEGATIVE mg/dL
Specific Gravity, Urine: 1.008 (ref 1.005–1.030)
pH: 7 (ref 5.0–8.0)

## 2022-08-27 LAB — PROCALCITONIN: Procalcitonin: <0.1 ng/mL

## 2022-08-27 LAB — TSH: TSH: 0.9 u[IU]/mL (ref 0.350–4.500)

## 2022-08-27 LAB — PHOSPHORUS: Phosphorus: 3.5 mg/dL (ref 2.5–4.6)

## 2022-08-27 LAB — LACTIC ACID, PLASMA: Lactic Acid, Venous: 1.5 mmol/L (ref 0.5–1.9)

## 2022-08-27 LAB — BRAIN NATRIURETIC PEPTIDE
B Natriuretic Peptide: 382.3 pg/mL — ABNORMAL HIGH (ref 0.0–100.0)
B Natriuretic Peptide: 415.7 pg/mL — ABNORMAL HIGH (ref 0.0–100.0)

## 2022-08-27 LAB — C-REACTIVE PROTEIN: CRP: 0.6 mg/dL (ref <1.0)

## 2022-08-27 LAB — URIC ACID: Uric Acid, Serum: 3.4 mg/dL (ref 2.5–7.1)

## 2022-08-27 MED ORDER — PREDNISONE 5 MG PO TABS: 50.0000 mg | ORAL_TABLET | Freq: Every day | ORAL | Status: DC

## 2022-08-27 MED ORDER — HYDRALAZINE HCL 20 MG/ML IJ SOLN
10.0000 mg | Freq: Four times a day (QID) | INTRAMUSCULAR | Status: DC | PRN
  Administered 2022-08-27: 10 mg via INTRAVENOUS
  Filled 2022-08-27: qty 1

## 2022-08-27 MED ORDER — MAGNESIUM SULFATE 4 GM/100ML IV SOLN
4.0000 g | Freq: Once | INTRAVENOUS | Status: AC
  Administered 2022-08-27: 4 g via INTRAVENOUS
  Filled 2022-08-27 (×2): qty 100

## 2022-08-27 MED ORDER — QUETIAPINE FUMARATE 50 MG PO TABS
50.0000 mg | ORAL_TABLET | Freq: Every day | ORAL | Status: DC
  Administered 2022-08-27 – 2022-08-28 (×2): 50 mg via ORAL
  Filled 2022-08-27 (×3): qty 1

## 2022-08-27 MED ORDER — METHYLPREDNISOLONE SODIUM SUCC 40 MG IJ SOLR: 40.0000 mg | Freq: Every day | INTRAMUSCULAR | Status: DC

## 2022-08-27 MED ORDER — EZETIMIBE 10 MG PO TABS
10.0000 mg | ORAL_TABLET | Freq: Every day | ORAL | Status: DC
  Administered 2022-08-27 – 2022-08-30 (×4): 10 mg via ORAL
  Filled 2022-08-27 (×3): qty 1

## 2022-08-27 MED ORDER — LEVALBUTEROL HCL 1.25 MG/0.5ML IN NEBU: 1.2500 mg | INHALATION_SOLUTION | RESPIRATORY_TRACT | Status: DC | PRN

## 2022-08-27 MED ORDER — SODIUM CHLORIDE 0.9 % IV SOLN
200.0000 mg | Freq: Once | INTRAVENOUS | Status: DC
  Filled 2022-08-27: qty 40

## 2022-08-27 MED ORDER — RIVAROXABAN 15 MG PO TABS
15.0000 mg | ORAL_TABLET | Freq: Every day | ORAL | Status: DC
  Administered 2022-08-28 – 2022-08-29 (×2): 15 mg via ORAL
  Filled 2022-08-27 (×3): qty 1

## 2022-08-27 MED ORDER — FAMOTIDINE 20 MG PO TABS
20.0000 mg | ORAL_TABLET | Freq: Every day | ORAL | Status: DC
  Administered 2022-08-27 – 2022-08-30 (×4): 20 mg via ORAL
  Filled 2022-08-27 (×4): qty 1

## 2022-08-27 MED ORDER — ACETAMINOPHEN 325 MG PO TABS
650.0000 mg | ORAL_TABLET | Freq: Four times a day (QID) | ORAL | Status: DC | PRN
  Administered 2022-08-28: 650 mg via ORAL
  Filled 2022-08-27: qty 2

## 2022-08-27 MED ORDER — METOPROLOL TARTRATE 100 MG PO TABS
100.0000 mg | ORAL_TABLET | Freq: Two times a day (BID) | ORAL | Status: DC
  Administered 2022-08-27 – 2022-08-30 (×6): 100 mg via ORAL
  Filled 2022-08-27 (×3): qty 1
  Filled 2022-08-27: qty 4
  Filled 2022-08-27 (×3): qty 1

## 2022-08-27 MED ORDER — METHYLPREDNISOLONE SODIUM SUCC 125 MG IJ SOLR
60.0000 mg | Freq: Every day | INTRAMUSCULAR | Status: DC
  Administered 2022-08-27: 60 mg via INTRAVENOUS
  Filled 2022-08-27: qty 2

## 2022-08-27 MED ORDER — SODIUM CHLORIDE 0.9 % IV SOLN
60.0000 mg/kg | Freq: Every day | INTRAVENOUS | Status: DC
  Filled 2022-08-27: qty 57.79

## 2022-08-27 MED ORDER — ACETAMINOPHEN 650 MG RE SUPP: 650.0000 mg | Freq: Four times a day (QID) | RECTAL | Status: DC | PRN

## 2022-08-27 MED ORDER — SODIUM CHLORIDE 0.9 % IV SOLN: 100.0000 mg | Freq: Every day | INTRAVENOUS | Status: DC

## 2022-08-27 MED ORDER — MELATONIN 3 MG PO TABS
3.0000 mg | ORAL_TABLET | Freq: Every evening | ORAL | Status: DC | PRN
  Administered 2022-08-27: 3 mg via ORAL
  Filled 2022-08-27 (×2): qty 1

## 2022-08-27 MED ORDER — LACTATED RINGERS IV SOLN: INTRAVENOUS | Status: AC

## 2022-08-27 MED ORDER — AMLODIPINE BESYLATE 5 MG PO TABS: 10.0000 mg | ORAL_TABLET | Freq: Every day | ORAL | Status: DC

## 2022-08-27 MED ORDER — METOPROLOL TARTRATE 25 MG PO TABS
50.0000 mg | ORAL_TABLET | Freq: Two times a day (BID) | ORAL | Status: DC
  Administered 2022-08-27: 50 mg via ORAL
  Filled 2022-08-27: qty 2

## 2022-08-27 MED ORDER — POTASSIUM CHLORIDE CRYS ER 20 MEQ PO TBCR
40.0000 meq | EXTENDED_RELEASE_TABLET | Freq: Once | ORAL | Status: AC
  Administered 2022-08-27: 40 meq via ORAL
  Filled 2022-08-27: qty 2

## 2022-08-27 MED ORDER — AMLODIPINE BESYLATE 5 MG PO TABS
5.0000 mg | ORAL_TABLET | Freq: Every day | ORAL | Status: DC
  Administered 2022-08-27 – 2022-08-30 (×4): 5 mg via ORAL
  Filled 2022-08-27 (×4): qty 1

## 2022-08-27 MED ORDER — METOPROLOL TARTRATE 5 MG/5ML IV SOLN: 5.0000 mg | INTRAVENOUS | Status: DC | PRN

## 2022-08-27 MED ORDER — LACTATED RINGERS IV SOLN: INTRAVENOUS | Status: DC

## 2022-08-27 NOTE — Evaluation (Signed)
Clinical/Bedside Swallow Evaluation Patient Details  Name: Tiffany Black MRN: [id number] Date of Birth: 16-Oct-1933  Today's Date: 08/27/2022 Time: SLP Start Time (ACUTE ONLY): 1010 SLP Stop Time (ACUTE ONLY): 1030 SLP Time Calculation (min) (ACUTE ONLY): 20 min  Past Medical History:  Past Medical History:  Diagnosis Date   Anxiety    Arthritis    Bulging of cervical intervertebral disc    Cardiomegaly    Cataracts, bilateral    Chronic neck pain    History of endometrial cancer    History of stroke    2015 and 2016 // carotid US 8/16: Bilateral 1-39 // carotid US 2/16: Bilateral ICA 1-39   HLD (hyperlipidemia)    Hx of echocardiogram    a. Echo (11/15): Mild LVH, EF 55-60%, normal wall motion, grade 2 diastolic dysfunction, mild LAE, normal RV function, PASP 29 mm Hg // echo 8/16:Mild concentric LVH, EF 55-60, normal wall motion, grade 2 diastolic dysfunction, mild LAE, PASP 39   Hypertension    Insomnia    Lung nodule    OA (osteoarthritis)    Pectus excavatum    Persistent atrial fibrillation ([place])    Vertigo    Past Surgical History:  Past Surgical History:  Procedure Laterality Date   ABDOMINAL HYSTERECTOMY  2011   CATARACT EXTRACTION, BILATERAL Bilateral 2009   CESAREAN SECTION  1975   LAPAROSCOPIC HYSTERECTOMY  2011   HPI:  Patient is an 88 y.o. female with PMH: dementia, anxiety, CVA (in 27 and 2016), HTN, osteoarthritis, vertigo, chronic neck pain. She was recently admitted 11/29-12/5/23 for TIA/stroke work-up. She presented to the hospital on 08/26/22 with severe Covid-19 infection after presenting from SNF to [facility] ED c/o SOB. In ED, Tmax of 99 degrees F, SpO2 initially 87% on RA but improved to 95% on 2L via nasal cannula. CXR showed bibasilar airspace opacities suggestive of atelectasis versus infiltrate.    Assessment / Plan / Recommendation  Clinical Impression  Patient is not currently presenting with clinical s/s of dysphagia as per this bedside swallow  evaluation. SLP observed her with PO intake of regular texture solids, puree solids, thin liquids. She was able to feed self with SLP providing only minor amount of setup assistance. Swallow initiation appeared timely with liquids and solids. No overt s/s aspiration or penetration observed. Voice was mildly hoarse but strong and clear and with no observed changes in vocal quality during or after PO intake. SpO2 and RR remained WFL throughout this evaluation. SLP recommending that patient continue with regular texture solids, thin liquids and no skilled SLP interventions needed at this time. SLP Visit Diagnosis: Dysphagia, unspecified (R13.10)    Aspiration Risk  No limitations;Mild aspiration risk    Diet Recommendation Regular;Thin liquid   Liquid Administration via: Cup;Straw Medication Administration: Whole meds with liquid Supervision: Intermittent supervision to cue for compensatory strategies;Patient able to self feed Compensations: Slow rate;Small sips/bites Postural Changes: Seated upright at 90 degrees    Other  Recommendations Oral Care Recommendations: Oral care BID    Recommendations for follow up therapy are one component of a multi-disciplinary discharge planning process, led by the attending physician.  Recommendations may be updated based on patient status, additional functional criteria and insurance authorization.  Follow up Recommendations No SLP follow up      Assistance Recommended at Discharge    Functional Status Assessment Patient has not had a recent decline in their functional status  Frequency and Duration   N/A  Prognosis   N/A     Swallow Study   General Date of Onset: 08/09/22 HPI: Patient is an 88 y.o. female with PMH: dementia, anxiety, CVA (in 51 and 2016), HTN, osteoarthritis, vertigo, chronic neck pain. She was recently admitted 11/29-12/5/23 for TIA/stroke work-up. She presented to the hospital on 08/26/22 with severe Covid-19 infection  after presenting from SNF to [facility] ED c/o SOB. In ED, Tmax of 99 degrees F, SpO2 initially 87% on RA but improved to 95% on 2L via nasal cannula. CXR showed bibasilar airspace opacities suggestive of atelectasis versus infiltrate. Type of Study: Bedside Swallow Evaluation Previous Swallow Assessment: during recent past admission Diet Prior to this Study: Regular;Thin liquids Temperature Spikes Noted: No Respiratory Status: Room air History of Recent Intubation: No Behavior/Cognition: Alert;Cooperative;Pleasant mood Oral Cavity Assessment: Within Functional Limits Oral Care Completed by SLP: No Oral Cavity - Dentition: Adequate natural dentition Vision: Functional for self-feeding Self-Feeding Abilities: Able to feed self Patient Positioning: Upright in bed Baseline Vocal Quality: Hoarse;Normal Volitional Cough: Cognitively unable to elicit Volitional Swallow: Unable to elicit    Oral/Motor/Sensory Function Overall Oral Motor/Sensory Function: Within functional limits   Ice Chips     Thin Liquid Thin Liquid: Within functional limits Presentation: Straw;Self Fed    Nectar Thick     Honey Thick     Puree Puree: Within functional limits   Solid     Solid: Within functional limits Presentation: [place], MA, CCC-SLP Speech Therapy

## 2022-08-27 NOTE — Progress Notes (Addendum)
PHARMACY - PHYSICIAN COMMUNICATION CRITICAL VALUE ALERT - BLOOD CULTURE IDENTIFICATION (BCID)  Trinda Harlacher is an 88 y.o. female who presented to [facility] on 08/26/2022 with a chief complaint of COVID  Assessment:  contaminant (include suspected source if known)  Name of physician (or Provider) Contacted: [place] [facility]  Current antibiotics: azithro/CTX  Changes to prescribed antibiotics recommended:  Patient is on recommended antibiotics - No changes needed  Results for orders placed or performed during the hospital encounter of 08/26/22  Blood Culture ID Panel (Reflexed) (Collected: 08/26/2022 11:50 PM)  Result Value Ref Range   Enterococcus faecalis NOT DETECTED NOT DETECTED   Enterococcus Faecium NOT DETECTED NOT DETECTED   Listeria monocytogenes NOT DETECTED NOT DETECTED   Staphylococcus species DETECTED (A) NOT DETECTED   Staphylococcus aureus (BCID) NOT DETECTED NOT DETECTED   Staphylococcus epidermidis DETECTED (A) NOT DETECTED   Staphylococcus lugdunensis NOT DETECTED NOT DETECTED   Streptococcus species NOT DETECTED NOT DETECTED   Streptococcus agalactiae NOT DETECTED NOT DETECTED   Streptococcus pneumoniae NOT DETECTED NOT DETECTED   Streptococcus pyogenes NOT DETECTED NOT DETECTED   A.calcoaceticus-baumannii NOT DETECTED NOT DETECTED   Bacteroides fragilis NOT DETECTED NOT DETECTED   Enterobacterales NOT DETECTED NOT DETECTED   Enterobacter cloacae complex NOT DETECTED NOT DETECTED   Escherichia coli NOT DETECTED NOT DETECTED   Klebsiella aerogenes NOT DETECTED NOT DETECTED   Klebsiella oxytoca NOT DETECTED NOT DETECTED   Klebsiella pneumoniae NOT DETECTED NOT DETECTED   Proteus species NOT DETECTED NOT DETECTED   Salmonella species NOT DETECTED NOT DETECTED   Serratia marcescens NOT DETECTED NOT DETECTED   Haemophilus influenzae NOT DETECTED NOT DETECTED   Neisseria meningitidis NOT DETECTED NOT DETECTED   Pseudomonas aeruginosa NOT DETECTED NOT DETECTED    Stenotrophomonas maltophilia NOT DETECTED NOT DETECTED   Candida albicans NOT DETECTED NOT DETECTED   Candida auris NOT DETECTED NOT DETECTED   Candida glabrata NOT DETECTED NOT DETECTED   Candida krusei NOT DETECTED NOT DETECTED   Candida parapsilosis NOT DETECTED NOT DETECTED   Candida tropicalis NOT DETECTED NOT DETECTED   Cryptococcus neoformans/gattii NOT DETECTED NOT DETECTED   Methicillin resistance mecA/C DETECTED (A) NOT DETECTED    Einar Grad 08/27/2022  9:03 PM

## 2022-08-27 NOTE — ED Notes (Signed)
Pt pulled out her IV. Attempted by this RN twice with no success. IV team consult placed.

## 2022-08-27 NOTE — ED Notes (Signed)
Patient left the floor in stable condition with staff and her belongings, floor nurse aware.

## 2022-08-27 NOTE — Progress Notes (Signed)
PHARMACY NOTE:  ANTIMICROBIAL  88 y.o. female with medical history significant for paroxysmal atrial fibrillation anticoagulated on Xarelto and dementia with baseline confusion who presented with SOB and found to have COVID-19 pneumonia and admitted to the hospital.   Remdesivir ordered this morning. Per conversation with ED RN, patient's family declined medication at this time. Notified Dr. Candiss Norse of family's decision. Will discontinue further doses until further notice.  Thank you for allowing pharmacy to be a part of this patient's care.  Ardyth Harps, PharmD Clinical Pharmacist

## 2022-08-27 NOTE — ED Notes (Signed)
[facility] called, requested RN Charge to initiate purpleman.

## 2022-08-27 NOTE — ED Provider Notes (Signed)
Received patient in turnover from Dr. Zenia Resides.  Please see their note for further details of Hx, PE.  Briefly patient is a 88 y.o. female with a Shortness of Breath and Tachycardia .  Patient was found to be COVID-positive and had an x-ray that was concerning for pneumonia.  Was started on antibiotics.  Plan to admit post head CTs the patient also unfortunately had suffered a fall earlier today.  CT of the head is negative.  Will discuss with medicine for admission.  She is on 2 L of oxygen and satting well.  Was in A-fib and was giving a bolus dose of metoprolol.       Deno Etienne, DO 08/27/22 0246

## 2022-08-27 NOTE — H&P (Signed)
History and Physical      Tiffany Black JGG:[id number] DOB: [birth year] DOA: 08/26/2022  PCP: Ginger Organ., MD  Patient coming from: SNF  I have personally briefly reviewed patient's old medical records in [place]  Chief Complaint: sob  HPI: Tiffany Black is a 88 y.o. female with medical history significant for paroxysmal atrial fibrillation chronically anticoagulated on Xarelto, dementia with baseline confusion, sent hypertension, stage IIIa CKD with baseline creatinine 1.1 - 1.4, anemia of chronic disease with baseline hemoglobin 11-13.5.  Who is admitted to [facility] on 08/26/2022 with severe COVID-19 infection after presenting from SNF to [facility] ED complaining of shortness of breath.   The following history is provided by the patient, SNF staff, in addition to my discussions with the EDP and via chart review.  The patient has been experiencing new onset shortness of breath over the last 3 to 4 days associated with new onset nonproductive cough as well as subjective fever in the absence of chills or rigors.  Not associate with any chest pain, palpitations, diaphoresis, nausea, vomiting.   No known baseline supplemental oxygen requirements.  Additionally, no known chronic underlying pulmonary pathology and this lifelong non-smoker.  Has a history of paroxysmal atrial fibrillation chronically anticoagulated on Xarelto.  Most recent echocardiogram was performed on 08/10/2022 and was notable for LVEF 60 to 65%, no focal motion arise, indeterminate diastolic parameters, normal right ventricular systolic function, no evidence of significant valvular pathology.  She is on scheduled metoprolol to tartrate as an outpatient.  No known history of underlying diabetes.  Position also notable for generalized weakness over the last 2 to 3 days in the absence of any associated acute focal weakness.  In the context of this generalized weakness, the patient experienced a ground-level  fall at SNF earlier in the day.  While this fall was unwitnessed, the patient reports that she tripped while attempting to ambulate, and does not believe that she hit her head as a component of this fall.  Denies any ensuing headache, neck pain, or acute arthralgias/myalgias as a consequence of this fall.  Denies any associated loss of consciousness.  Of note, patient was recently hospitalized at [facility] from 08/09/2022 - 121/6/23 for TIA/stroke rule out after presenting with expressive aphasia.  Hospital course was also notable for atrial fibrillation with RVR.  Patient brought to [facility] emergency department this evening for further evaluation and management of the above.  And route, she received a total of 300 cc of normal saline via EMS.     ED Course:  Vital signs in the ED were notable for the following: Temperature max 99.0; noted to be in atrial fibrillation with RVR, with initial heart rates in the range of 130s to 150s, subsequently improving into the low 100s to 110s following single dose of IV Lopressor; slight blood pressures in the 150s to 160s; better rate 18-24; initial oxygen saturation noted to be 87% on room air, socially improving into the range of 95 to 96% on 2 L nasal cannula.  Labs were notable for the following: BMP notable for sodium 133 relative demonstrating prior value of 139 on 08/15/2022, bicarbonate 22, creatinine 1.11 compared to 1.41 on 08/15/2022, glucose 140.  CBC notable for upper cell count 4600 with 79% neutrophils, hemoglobin 11.6 associated with normocytic/normochromic findings as well as not elevated RDW, bili at 156.  Lactic acid 1.5.  Urinalysis showed no white blood cells and was nitrate/leukocyte esterase negative.  Blood cultures x  2 were collected prior to initiation of IV antibiotics.  COVID-19 PCR performed in the ED this evening was found to be positive, while influenza/RSV PCR was negative.  Per my interpretation, EKG in ED demonstrated the  following: Relative to most recent prior EKG from 08/11/2022, this evening's EKG shows atrial fibrillation with RVR, with heart rate 136, left anterior fascicular block, nonspecific T wave inversion in leads I and aVL, of which the T wave inversion in aVL appears unchanged relative to most recent prior EKG, will demonstrated no evidence of ST changes, including no evidence of ST elevation.  Imaging and additional notable ED work-up: In the context of unwitnessed fall, on Xarelto, none contrast CT head was pursued and showed no evidence of acute intracranial process, including no evidence of intracranial hemorrhage or any evidence of acute infarct.  Chest x-ray shows bibasilar airspace opacities suggestive of atelectasis versus infiltrate, in the absence of any evidence of interstitial or pulmonary edema as well as no evidence of pleural effusion or pneumothorax.  While in the ED, the following were administered: Lopressor 5 mg IV x 1, azithromycin, Rocephin, lactated Ringer's at 125 cc/h.  Subsequently, the patient was admitted for further evaluation management of severe GNFAO-13 infection complicated by acute hypoxic respiratory distress as well as atrial fibrillation with RVR with presentation also notable for generalized weakness as well as acute hyponatremia.      Review of Systems: As per HPI otherwise 10 point review of systems negative.   Past Medical History:  Diagnosis Date   Anxiety    Arthritis    Bulging of cervical intervertebral disc    Cardiomegaly    Cataracts, bilateral    Chronic neck pain    History of endometrial cancer    History of stroke    2015 and 2016 // carotid US 8/16: Bilateral 1-39 // carotid US 2/16: Bilateral ICA 1-39   HLD (hyperlipidemia)    Hx of echocardiogram    a. Echo (11/15): Mild LVH, EF 55-60%, normal wall motion, grade 2 diastolic dysfunction, mild LAE, normal RV function, PASP 29 mm Hg // echo 8/16:Mild concentric LVH, EF 55-60, normal wall motion,  grade 2 diastolic dysfunction, mild LAE, PASP 39   Hypertension    Insomnia    Lung nodule    OA (osteoarthritis)    Pectus excavatum    Persistent atrial fibrillation ([place])    Vertigo     Past Surgical History:  Procedure Laterality Date   ABDOMINAL HYSTERECTOMY  2011   CATARACT EXTRACTION, BILATERAL Bilateral 2009   CESAREAN SECTION  1975   LAPAROSCOPIC HYSTERECTOMY  2011    Social History:  reports that she has never smoked. She has never used smokeless tobacco. She reports that she does not drink alcohol and does not use drugs.   Allergies  Allergen Reactions   Hydrochlorothiazide Hives   Penicillins Hives    Has patient had a PCN reaction causing immediate rash, facial/tongue/throat swelling, SOB or lightheadedness with hypotension: Yes Has patient had a PCN reaction causing severe rash involving mucus membranes or skin necrosis: No Has patient had a PCN reaction that required hospitalization No Has patient had a PCN reaction occurring within the last 10 years: No If all of the above answers are "NO", then may proceed with Cephalosporin use.    Atorvastatin Other (See Comments)    "Weakness"   Ciprofloxacin Other (See Comments) and Hypertension    Blood pressure issues- patient doesn't recall if it increased OR decreased it  Doxycycline Diarrhea and Nausea And Vomiting   Hydrocodone-Acetaminophen Other (See Comments)    Reaction not recalled   Iodinated Contrast Media Diarrhea, Other (See Comments) and Hypertension    Elevated the B/P   Iodine-131 Other (See Comments) and Hypertension    Raised the blood pressure   Latex Itching   Metoprolol Other (See Comments)    Reaction not recalled- is tolerating this in 2023   Statins Other (See Comments)    Muscle weakness   Carbamazepine Other (See Comments) and Rash    Flushed blood pressure medication out of system   Codeine Nausea And Vomiting   Nickel Rash   Sulfa Antibiotics Hives and Rash   Sulfamethoxazole  Hives and Rash   Sulfonamide Derivatives Hives and Rash         Family History  Problem Relation Age of Onset   Dementia Mother    Heart attack Father    Heart disease Father    Stroke Maternal Uncle    Hypertension Neg Hx     Family history reviewed and not pertinent    Prior to Admission medications   Medication Sig Start Date End Date Taking? Authorizing Provider  acetaminophen (TYLENOL) 325 MG tablet Take 2 tablets (650 mg total) by mouth every 6 (six) hours as needed for mild pain (or Fever >/= 101). 08/15/22   Elgergawy, Silver Huguenin, MD  DIOVAN 160 MG tablet Take 80 mg by mouth 2 (two) times daily. 02/16/21   [provider]  ezetimibe (ZETIA) 10 MG tablet Take 1 tablet (10 mg total) by mouth daily. 08/16/22   Elgergawy, Silver Huguenin, MD  felodipine (PLENDIL) 10 MG 24 hr tablet Take 1 tablet (10 mg total) by mouth daily. 08/16/22   Elgergawy, Silver Huguenin, MD  FLUoxetine (PROZAC) 10 MG capsule Take 10 mg by mouth daily.    [provider]  KLOR-CON M20 20 MEQ tablet Take 10 mEq by mouth daily.    [provider]  Meclizine HCl 25 MG CHEW Chew 25 mg by mouth every 8 (eight) hours as needed (for dizziness). 06/03/21   [provider]  metoprolol tartrate (LOPRESSOR) 50 MG tablet Take 1 tablet (50 mg total) by mouth 2 (two) times daily. 08/15/22   Elgergawy, Silver Huguenin, MD  QUEtiapine (SEROQUEL) 50 MG tablet Take 1 tablet (50 mg total) by mouth at bedtime. 08/15/22   Elgergawy, Silver Huguenin, MD  Rivaroxaban (XARELTO) 15 MG TABS tablet Take 1 tablet (15 mg total) by mouth daily with supper. 03/06/22   Deboraha Sprang, MD  TYLENOL PM EXTRA STRENGTH 500-25 MG TABS tablet Take 2 tablets by mouth at bedtime.    [provider]     Objective    Physical Exam: Vitals:   08/27/22 0015 08/27/22 0030 08/27/22 0115 08/27/22 0145  BP: (!) 167/109 (!) 172/104 (!) 154/100 (!) 161/94  Pulse: (!) 123 (!) 117 (!) 114 (!) 104  Resp: 18 (!) 22 (!) 24 (!) 22  Temp:       TempSrc:      SpO2: 95% 95% 95% 92%  Weight:        General: appears to be stated age; alert, confused Skin: warm, dry, no rash Head:  AT/[place] Mouth:  Oral mucosa membranes appear moist, normal dentition Neck: supple; trachea midline Heart:  irregular; did not appreciate any M/R/G Lungs: CTAB, did not appreciate any wheezes, rales, or rhonchi Abdomen: + BS; soft, ND, NT Vascular: 2+ pedal pulses b/l; 2+ radial pulses b/l  Extremities: no peripheral edema, no muscle wasting Neuro: strength and sensation intact in upper and lower extremities b/l    Labs on Admission: I have personally reviewed following labs and imaging studies  CBC: Recent Labs  Lab 08/26/22 2155  WBC 4.6  NEUTROABS 3.7  HGB 11.6*  HCT 34.4*  MCV 93.5  PLT 885   Basic Metabolic Panel: Recent Labs  Lab 08/26/22 2155  NA 133*  K 4.0  CL 100  CO2 22  GLUCOSE 140*  BUN 13  CREATININE 1.11*  CALCIUM 8.6*   GFR: Estimated Creatinine Clearance: 34.6 mL/min (A) (by C-G formula based on SCr of 1.11 mg/dL (H)). Liver Function Tests: No results for input(s): "AST", "ALT", "ALKPHOS", "BILITOT", "PROT", "ALBUMIN" in the last 168 hours. No results for input(s): "LIPASE", "AMYLASE" in the last 168 hours. No results for input(s): "AMMONIA" in the last 168 hours. Coagulation Profile: No results for input(s): "INR", "PROTIME" in the last 168 hours. Cardiac Enzymes: No results for input(s): "CKTOTAL", "CKMB", "CKMBINDEX", "TROPONINI" in the last 168 hours. BNP (last 3 results) No results for input(s): "PROBNP" in the last 8760 hours. HbA1C: No results for input(s): "HGBA1C" in the last 72 hours. CBG: No results for input(s): "GLUCAP" in the last 168 hours. Lipid Profile: No results for input(s): "CHOL", "HDL", "LDLCALC", "TRIG", "CHOLHDL", "LDLDIRECT" in the last 72 hours. Thyroid Function Tests: No results for input(s): "TSH", "T4TOTAL", "FREET4", "T3FREE", "THYROIDAB" in the last 72 hours. Anemia  Panel: No results for input(s): "VITAMINB12", "FOLATE", "FERRITIN", "TIBC", "IRON", "RETICCTPCT" in the last 72 hours. Urine analysis:    Component Value Date/Time   COLORURINE STRAW (A) 08/26/2022 2355   APPEARANCEUR CLEAR 08/26/2022 2355   LABSPEC 1.008 08/26/2022 2355   PHURINE 7.0 08/26/2022 2355   GLUCOSEU NEGATIVE 08/26/2022 2355   HGBUR NEGATIVE 08/26/2022 2355   BILIRUBINUR NEGATIVE 08/26/2022 2355   KETONESUR NEGATIVE 08/26/2022 2355   PROTEINUR NEGATIVE 08/26/2022 2355   UROBILINOGEN 0.2 05/22/2015 1950   NITRITE NEGATIVE 08/26/2022 2355   LEUKOCYTESUR NEGATIVE 08/26/2022 2355    Radiological Exams on Admission: CT Head Wo Contrast  Result Date: 08/27/2022 CLINICAL DATA:  Head trauma EXAM: CT HEAD WITHOUT CONTRAST TECHNIQUE: Contiguous axial images were obtained from the base of the skull through the vertex without intravenous contrast. RADIATION DOSE REDUCTION: This exam was performed according to the departmental dose-optimization program which includes automated exposure control, adjustment of the mA and/or kV according to patient size and/or use of iterative reconstruction technique. COMPARISON:  None Available. FINDINGS: Brain: There is no mass, hemorrhage or extra-axial collection. There is generalized atrophy without lobar predilection. Hypodensity of the white matter is most commonly associated with chronic microvascular disease. Vascular: No abnormal hyperdensity of the major intracranial arteries or dural venous sinuses. No intracranial atherosclerosis. Skull: The visualized skull base, calvarium and extracranial soft tissues are normal. Sinuses/Orbits: Left maxillary sinus mucosal thickening. The orbits are normal. IMPRESSION: 1. No acute intracranial abnormality. 2. Generalized atrophy and findings of chronic microvascular disease. Electronically Signed   By: Ulyses Jarred M.D.   On: 08/27/2022 00:35   DG Chest Port 1 View  Result Date: 08/26/2022 CLINICAL DATA:   Shortness of breath EXAM: PORTABLE CHEST 1 VIEW COMPARISON:  Radiographs 08/09/2022 FINDINGS: Cardiomegaly. Aortic atherosclerotic calcification. Bibasilar atelectasis or infiltrates. No definite pleural effusion. No pneumothorax. No acute osseous abnormality. IMPRESSION: Bibasilar atelectasis or infiltrates. Electronically Signed   By: Placido Sou M.D.   On: 08/26/2022 22:09      Assessment/Plan  Principal Problem:   COVID-19 virus infection Active Problems:   Paroxysmal atrial fibrillation with RVR (HCC)   Essential hypertension   Acute respiratory failure with hypoxia (HCC)   Generalized weakness   Fall at home, initial encounter   Acute hyponatremia   Stage 3a chronic kidney disease (CKD) (HCC)   Anemia of chronic disease       #) Severe COVID-19 infection: dx on the basis of 3-4 days of new onset shortness of breath associate with new onset nonproductive cough, subjective fevers, with finding of positive COVID-19 PCR in the ED this evening. Additionally, in context of no known baseline supplemental O2 requirements, her initial oxygen saturations were in the high 80s on room air, subsequent moving into the mid 90s on 2 L nasal cannula. In setting of this acute hypoxia, criteria are met for patient's COVID-19 infxn to be considered severe in nature. Consequently, criteria met for initiation systemic corticosteroids. Will ordered solumedrol 0.5 mg/kg IV bid.   Of note, in the setting of the pt's age greater than 3 as well as multiple co-morbidities that include  ckd 3a, this pt meets criteria to be considered high risk for a more complicated clinical course of COVID-19 infxn, including increased probability for progression of the severity associated with this infxn. Therefore, in setting of symptomatic COVID-19 infxn requiring hospitalization for further evaluation and management thereof in this pt with the aforementioned high risk criteria who is felt to be early in the course of  their infxn given onset of respiratory symptoms starting less than 5 days ago, indications are met for initiation of remdesivir per tx guidance recommendations from [facility]'s Covid Treatment Guidelines.  Additionally, in the setting of acute hypoxic respiratory distress as well as questionable infiltrates on presenting chest x-ray, she is not a candidate for Paxlovid as an alternative to remdesivir.  No known history of diabetes.  Presenting chest x-ray shows evidence of bibasilar airspace opacities, suggestive of atelectasis versus infiltrate.  Given this possibility of infiltrate as well as finding of neutrophilic predominance on presenting CBC with differential, EDP started the patient on azithromycin and Rocephin for potential concomitant bacterial pneumonia.  Will check procalcitonin level to further assess and guide decision making regarding potential continuation of IV antibiotics. If found to be non-elevated, which, in the context of the pro-inflammatory state a/w COVID-19, provides a high degree of negative predictive value against the likelihood of any contribution from bacterial pneumonia.    Plan: Airborne and contact precautions. Monitor continuous pulse ox and monitor on telemetry. prn supplemental O2 to maintain O2 sats greater than or equal to 94%. Proning protocol initiated.  PRN acetaminophen for fever. Start solumedrol and 3-day course of remdesivir, as above. Check and trend inflammatory markers. Check serum mag and phos levels. Check CMP/CBC in the AM. Check procalcitonin, which, if non-elevated, will plan to d\c azithromycin and Rocephin, as above.              #) Acute hypoxic respiratory distress: in the context of acute respiratory symptoms and no known baseline supplemental oxygen requirements, presenting hypoxia on room air noted, subsequently improving into the mid 90s on 2 L nasal cannula. Appears to be on the basis of COVID-19 infection, as above.  There is also the  possibility of potential bibasilar infiltrates on chest x-ray, which is being further evaluated with procalcitonin level, as further detailed above.  No known chronic underlying pulmonary conditions. ACS is felt to be less likely at this time in the  absence of any recent chest pain and presenting EKG showing no evidence of acute ischemic process, including no evidence of STEMI. No clinical or radiographic evidence of suggest acutely decompensated heart failure at this time.  Clinically, presentation is suggestive of acute pulmonary embolism, which is made further unlikely in the setting of chronic anticoagulation on Xarelto.   Plan: further evaluation and management of presenting COVID-19 infection, as above, including monitoring of continuous pulse oximetry with prn supplemental O2 to maintain O2 sats greater than or equal to 94%. monitor on telemetry. Trending of inflammatory markers, as above. Check CMP and CBC in the morning. Check serum Mg and Phos levels.  Add on BNP.  Solumedrol, remdesivir, as above.           #) Atrial fibrillation with RVR: In the setting of a known h/o of paroxysmal atrial fibrillation, was noted to be in A-fib RVR with initial HR's in the 130s to 150s, subsequently decreasing into the low 100s to 110s single dose of IV Lopressor. BP has tolerated both the RVR as well as ensuing dose of IV Lopressor, without any overt hypotension.  In terms of factors leading to this exacerbation,  suspect contributions from presenting acute respiratory infection in the form of severe COVID-19 as well as contribution from resultant acute hypoxic respiratory distress. ACS felt to be less likely in the absence of any recent CP, while presenting EKG shows no evidence of acute ischemic changes.   Most recent echocardiogram was performed in November 2023, with results as further detailed above. In the setting of CHA2DS2-VASc score of 6, there is an indication for chronic anticoagulation for  thromboembolic prophylaxis. Consistent with this, the patient is chronically anticoagulated on Xarelto. Home AV nodal blocking regimen: Lopressor 50 mg p.o. twice daily.  Given the patient's hemodynamic stability and improvement in rate control following single dose of IV Lopressor, will resume home metoprolol tartrate, with next dose to occur now, while treating underlying contributing factors, including COVID-19 infection as well as acute hypoxic respiratory distress, as further detailed below.   Plan: Monitor strict I's & O's and daily weights. Monitor on telemetry. Check serum Mg level with prn supplementation to maintain levels of greater than or equal to 2.0. Repeat CMP/CBC in the AM.  As needed IV Lopressor for sustained heart rates greater than 130.  Metoprolol tartrate, with next dose now.  Continue outpatient Xarelto.  Check TSH.  Further evaluation management of severe HQPRF-16 infection complicated by acute Evoxac respiratory distress, as above.  In the setting of presenting acute infection as well as associated increased risk for reflex tachycardia, will hold home felodipine for now.  Add on procalcitonin level.  Add on BMP.                  #) Generalized weakness:  2-3  duration of generalized weakness, in the absence of any evidence of acute focal neurologic deficits, including no evidence of acute focal weakness to suggest acute CVA.  Suspect contribution from physiologic stress stemming from presenting severe COVID-19 infection.  Will further eval for any additional contributions from endocrine/metabolic sources, as detailed below.    Plan: work-up and management of presenting severe COVID-19 infection, as described above. PT  consult ordered for the AM. Fall precautions. CMP/CBC in the AM. Check TSH, serum Mg level.               #) Ground-level mechanical fall: As described above, presents with unwitnessed fall, without any reported associated loss  of  consciousness, with fall reported to ground-level mechanical in nature, with likely predisposition as a result of generalized weakness stemming from presenting severe COVID-19 infection, as further detailed above.  However, in the setting of unwitnessed fall on Xarelto, CT head was pursued and showed no evidence of acute process clinically no evidence of intracranial hemorrhage.   Plan: Fall precautions.  Further evaluation and management of generalized weakness, including PT consult in the morning.           #) Acute hypo-osmolar hyponatremia: Presenting serum sodium of 133 relative to the 139 on 08/15/2022.  Appears to be relatively euvolemic, with increased risk for SIADH in the context of presenting acute COVID-19 infection.  No report of any recent acute GI losses.  Mechanical fall earlier in the day as noted, although patient denies hitting her head as a component of this fall, while CT head showed no evidence of acute process.  Potential pharmacologic contribution from Prozac, although it appears that this is not a new medication for her.  Presentation not associated any acute focal neurologic deficits.  Given appearance of relative euvolemia and hemodynamic stability, will hold additional continuous lactated Ringer's at this time, and expand laboratory evaluation of underlying etiology contributing to presenting acute hyponatremia, as further detailed below.    Plan: monitor strict I's and O's and daily weights.  check random urine sodium, urine osmolality.  Check serum osmolality to confirm suspected hypoosmolar etiology. Check serum uric acid level, as SIADH can be associated with hypouricemia due to hyperuricuria.  Repeat CMP in the morning. Check TSH. Hold continuous LR for now.  Hold Prozac for now.  Further evaluation and management of presenting severe COVID-19 infection, as above.  Add on BNP.  Check procalcitonin level.               #) Essential Hypertension:  documented h/o such, with outpatient antihypertensive regimen including Diovan, felodipine, and metoprolol tartrate.  SBP's in the ED today: 150s and 160s mmHg. in the setting of recent COVID-19 infection, will hold him Diovan for now.  Additionally, in the context of presenting atrial fibrillation with RVR, will hold him felodipine for now due to increased risk of reflex tachycardia.   Plan: Close monitoring of subsequent BP via routine VS. resume home metoprolol tartrate, with next dose to occur now.  Hold home Diovan and felodipine for now, as above.             #) CKD Stage 3A: Documented history of such, with baseline creatinine 1.1-1.4, with presenting creatinine consistent with this baseline.    Plan: Monitor strict I's and O's and daily weights.  Attempt to avoid nephrotoxic agents.  CMP/magnesium level in the AM.             #) Anemia of chronic disease: Documented history of such, a/w with baseline hgb range 11-13.5, with presenting hgb consistent with this range, in the absence of any overt evidence of active bleed.     Plan: Repeat CBC in the morning.         DVT prophylaxis: SCD's + continuation of home Xarelto Code Status: Full code (presumed in setting of dementia, baseline confusion) Family Communication: none Disposition Plan: Per Rounding Team Consults called: none;  Admission status: inpatient     I SPENT GREATER THAN 75  MINUTES IN CLINICAL CARE TIME/MEDICAL DECISION-MAKING IN COMPLETING THIS ADMISSION.      [place] DO [facility]  From [place]   08/27/2022,  3:02 AM

## 2022-08-27 NOTE — Progress Notes (Signed)
PROGRESS NOTE                                                                                                                                                                                                             Patient Demographics:    Tiffany Black, is a 88 y.o. female, DOB - 1934-05-07, BWI:[id number]  Outpatient Primary MD for the patient is Ginger Organ., MD    LOS - 0  Admit date - 08/26/2022    Chief Complaint  Patient presents with   Shortness of Breath   Tachycardia       Brief Narrative (HPI from H&P)   88 y.o. female with medical history significant for paroxysmal atrial fibrillation chronically anticoagulated on Xarelto, dementia with baseline confusion, sent hypertension, stage IIIa CKD with baseline creatinine 1.1 - 1.4, anemia of chronic disease with baseline hemoglobin around 12 who lives at [facility] and complained of some shortness of breath, she was brought to the ER where she was found to have COVID-19 pneumonia and admitted to the hospital.  She was recently hospitalized to the hospital for acute stroke with expressive aphasia and was found to have A-fib that admission.   Subjective:    [name] today has, No headache, No chest pain, No abdominal pain - No Nausea, No new weakness tingling or numbness, No Cough - SOB.     Assessment  & Plan :   #) Hypoxic respiratory failure due to COVID-19 infection.  Much improved after supportive care continue steroids continue remdesivir which was started upon admission in the ER, much improved and on room air, advance activity, encouraged to sit up in chair use I-S and flutter valve for pulmonary toiletry.  Will monitor inflammatory markers.    #) Chronic atrial fibrillation with RVR, [place] vas 2 score of greater than 5: Increased Lopressor dose, check TSH, continue Xarelto for anticoagulation and monitor.    #) Generalized weakness: Due to COVID-19  infection.  Supportive care, PT OT and monitor.   #) Ground-level mechanical fall: As described above, presents with unwitnessed fall, without any reported associated loss of consciousness, with fall reported to ground-level mechanical in nature, likely due to generalized weakness, CT head stable, PT OT and advance activity.  Monitor.  #) Mild hyponatremia.  Appears to be euvolemic, question SIADH, hold IV fluids, sodium  is dropping with IV fluids, check urine osmolality, urine electrolytes along with serum osmolality.   #) Essential Hypertension: documented h/o such, with outpatient antihypertensive regimen including Diovan, felodipine, and metoprolol tartrate.  Medications adjusted placed on higher dose beta-blocker along with Norvasc and as needed hydralazine monitor and adjust    #) CKD Stage 3A: Hydrate and monitor.   #) Anemia of chronic disease: Baseline hemoglobin around 12, monitor no signs of overt bleeding.  #) Mild metabolic encephalopathy.  Question underlying dementia also recent stroke with expressive aphasia, monitor with supportive care.       Condition - Extremely Guarded  Family Communication  : None present  Code Status : Full code  Consults  : None  PUD Prophylaxis : Pepcid.   Procedures  :            Disposition Plan  :    Status is: Inpatient   DVT Prophylaxis  :    SCDs Start: 08/27/22 0204 Rivaroxaban (XARELTO) tablet 15 mg    Lab Results  Component Value Date   PLT 151 08/27/2022    Diet :  Diet Order             Diet regular Room service appropriate? Yes; Fluid consistency: Thin  Diet effective now                    Inpatient Medications  Scheduled Meds:  amLODipine  5 mg Oral Daily   ezetimibe  10 mg Oral Daily   [START ON 08/28/2022] methylPREDNISolone (SOLU-MEDROL) injection  40 mg Intravenous Daily   metoprolol tartrate  100 mg Oral BID   potassium chloride  40 mEq Oral Once   QUEtiapine  50 mg Oral QHS    Rivaroxaban  15 mg Oral Q supper   Continuous Infusions:  azithromycin Stopped (08/27/22 0330)   cefTRIAXone (ROCEPHIN)  IV Stopped (08/27/22 0150)   lactated ringers     magnesium sulfate bolus IVPB     remdesivir 200 mg in sodium chloride 0.9% 250 mL IVPB     Followed by   Derrill Memo ON 08/28/2022] remdesivir 100 mg in sodium chloride 0.9 % 100 mL IVPB     PRN Meds:.acetaminophen **OR** acetaminophen, hydrALAZINE, levalbuterol, melatonin, metoprolol tartrate    Objective:   Vitals:   08/27/22 0215 08/27/22 0245 08/27/22 0304 08/27/22 0745  BP: (!) 159/99 (!) 170/112  (!) 165/120  Pulse: (!) 101   94  Resp: (!) 26 (!) 24  (!) 24  Temp:   99.1 F (37.3 C)   TempSrc:   Oral   SpO2: 91% 95%  96%  Weight:        Wt Readings from Last 3 Encounters:  08/26/22 60.2 kg  08/15/22 60.2 kg  07/14/22 67 kg     Intake/Output Summary (Last 24 hours) at 08/27/2022 0928 Last data filed at 08/27/2022 0759 Gross per 24 hour  Intake 600 ml  Output --  Net 600 ml     Physical Exam  Awake but confused No new F.N deficits, Normal affect [place].AT,PERRAL Supple Neck, No JVD,   Symmetrical Chest wall movement, Good air movement bilaterally, CTAB RRR,No Gallops,Rubs or new Murmurs,  +ve B.Sounds, Abd Soft, No tenderness,   No Cyanosis, Clubbing or edema      Data Review:    Recent Labs  Lab 08/26/22 2155 08/27/22 0411  WBC 4.6 3.9*  HGB 11.6* 11.1*  HCT 34.4* 33.8*  PLT 156 151  MCV 93.5 95.8  MCH 31.5 31.4  MCHC 33.7 32.8  RDW 13.4 13.5  LYMPHSABS 0.5* 0.5*  MONOABS 0.5 0.5  EOSABS 0.0 0.0  BASOSABS 0.0 0.0    Recent Labs  Lab 08/26/22 2155 08/26/22 2350 08/27/22 0411  NA 133*  --  132*  K 4.0  --  3.4*  CL 100  --  99  CO2 22  --  21*  ANIONGAP 11  --  12  GLUCOSE 140*  --  116*  BUN 13  --  10  CREATININE 1.11*  --  0.89  AST  --   --  32  ALT  --   --  22  ALKPHOS  --   --  51  BILITOT  --   --  0.4  ALBUMIN  --   --  3.2*  PROCALCITON  --   --  <0.10   LATICACIDVEN  --  1.5  --   TSH  --   --  0.900  BNP  --   --  382.3*  MG  --   --  1.5*  CALCIUM 8.6*  --  8.0*      Recent Labs  Lab 08/26/22 2155 08/26/22 2350 08/27/22 0411  PROCALCITON  --   --  <0.10  LATICACIDVEN  --  1.5  --   TSH  --   --  0.900  BNP  --   --  382.3*  MG  --   --  1.5*  CALCIUM 8.6*  --  8.0*    Recent Labs  Lab 08/26/22 2155 08/26/22 2350 08/27/22 0411  WBC 4.6  --  3.9*  PLT 156  --  151  PROCALCITON  --   --  <0.10  LATICACIDVEN  --  1.5  --   CREATININE 1.11*  --  0.89    ------------------------------------------------------------------------------------------------------------------ No results for input(s): "CHOL", "HDL", "LDLCALC", "TRIG", "CHOLHDL", "LDLDIRECT" in the last 72 hours.  Lab Results  Component Value Date   HGBA1C 5.7 (H) 08/10/2022    Recent Labs    08/27/22 0411  TSH 0.900   ------------------------------------------------------------------------------------------------------------------ Cardiac Enzymes No results for input(s): "CKMB", "TROPONINI", "MYOGLOBIN" in the last 168 hours.  Invalid input(s): "CK"  Micro Results Recent Results (from the past 240 hour(s))  Resp panel by RT-PCR (RSV, Flu A&B, Covid) Anterior Nasal Swab     Status: Abnormal   Collection Time: 08/26/22  9:55 PM   Specimen: Anterior Nasal Swab  Result Value Ref Range Status   SARS Coronavirus 2 by RT PCR POSITIVE (A) NEGATIVE Final    Comment: (NOTE) SARS-CoV-2 target nucleic acids are DETECTED.  The SARS-CoV-2 RNA is generally detectable in upper respiratory specimens during the acute phase of infection. Positive results are indicative of the presence of the identified virus, but do not rule out bacterial infection or co-infection with other pathogens not detected by the test. Clinical correlation with patient history and other diagnostic information is necessary to determine patient infection status. The expected result is  Negative.  Fact Sheet for Patients: EntrepreneurPulse.com.au  Fact Sheet for Healthcare Providers: IncredibleEmployment.be  This test is not yet approved or cleared by the [place] FDA and  has been authorized for detection and/or diagnosis of SARS-CoV-2 by FDA under an Emergency Use Authorization (EUA).  This EUA will remain in effect (meaning this test can be used) for the duration of  the COVID-19 declaration under Section 564(b)(1) of the A ct, 21 U.S.C. section 360bbb-3(b)(1), unless the authorization is terminated  or revoked sooner.     Influenza A by PCR NEGATIVE NEGATIVE Final   Influenza B by PCR NEGATIVE NEGATIVE Final    Comment: (NOTE) The Xpert Xpress SARS-CoV-2/FLU/RSV plus assay is intended as an aid in the diagnosis of influenza from Nasopharyngeal swab specimens and should not be used as a sole basis for treatment. Nasal washings and aspirates are unacceptable for Xpert Xpress SARS-CoV-2/FLU/RSV testing.  Fact Sheet for Patients: EntrepreneurPulse.com.au  Fact Sheet for Healthcare Providers: IncredibleEmployment.be  This test is not yet approved or cleared by the [place] FDA and has been authorized for detection and/or diagnosis of SARS-CoV-2 by FDA under an Emergency Use Authorization (EUA). This EUA will remain in effect (meaning this test can be used) for the duration of the COVID-19 declaration under Section 564(b)(1) of the Act, 21 U.S.C. section 360bbb-3(b)(1), unless the authorization is terminated or revoked.     Resp Syncytial Virus by PCR NEGATIVE NEGATIVE Final    Comment: (NOTE) Fact Sheet for Patients: EntrepreneurPulse.com.au  Fact Sheet for Healthcare Providers: IncredibleEmployment.be  This test is not yet approved or cleared by the [place] FDA and has been authorized for detection and/or diagnosis of SARS-CoV-2  by FDA under an Emergency Use Authorization (EUA). This EUA will remain in effect (meaning this test can be used) for the duration of the COVID-19 declaration under Section 564(b)(1) of the Act, 21 U.S.C. section 360bbb-3(b)(1), unless the authorization is terminated or revoked.  Performed at [facility], [place] [address] [place], [place] [address]   Culture, blood (Routine X 2) w Reflex to ID Panel     Status: None (Preliminary result)   Collection Time: 08/26/22 11:50 PM   Specimen: BLOOD  Result Value Ref Range Status   Specimen Description BLOOD LEFT ANTECUBITAL  Final   Special Requests   Final    BOTTLES DRAWN AEROBIC AND ANAEROBIC Blood Culture adequate volume Performed at [facility], [place] [address] [place],  [address]    Culture PENDING  Incomplete   Report Status PENDING  Incomplete    Radiology Reports CT Head Wo Contrast  Result Date: 08/27/2022 CLINICAL DATA:  Head trauma EXAM: CT HEAD WITHOUT CONTRAST TECHNIQUE: Contiguous axial images were obtained from the base of the skull through the vertex without intravenous contrast. RADIATION DOSE REDUCTION: This exam was performed according to the departmental dose-optimization program which includes automated exposure control, adjustment of the mA and/or kV according to patient size and/or use of iterative reconstruction technique. COMPARISON:  None Available. FINDINGS: Brain: There is no mass, hemorrhage or extra-axial collection. There is generalized atrophy without lobar predilection. Hypodensity of the white matter is most commonly associated with chronic microvascular disease. Vascular: No abnormal hyperdensity of the major intracranial arteries or dural venous sinuses. No intracranial atherosclerosis. Skull: The visualized skull base, calvarium and extracranial soft tissues are normal. Sinuses/Orbits: Left maxillary sinus mucosal thickening. The orbits are normal. IMPRESSION: 1. No acute intracranial  abnormality. 2. Generalized atrophy and findings of chronic microvascular disease. Electronically Signed   By: Ulyses Jarred M.D.   On: 08/27/2022 00:35   DG Chest Port 1 View  Result Date: 08/26/2022 CLINICAL DATA:  Shortness of breath EXAM: PORTABLE CHEST 1 VIEW COMPARISON:  Radiographs 08/09/2022 FINDINGS: Cardiomegaly. Aortic atherosclerotic calcification. Bibasilar atelectasis or infiltrates. No definite pleural effusion. No pneumothorax. No acute osseous abnormality. IMPRESSION: Bibasilar atelectasis or infiltrates. Electronically Signed   By: Placido Sou M.D.   On: 08/26/2022 22:09  Signature  -   Lala Lund M.D on 08/27/2022 at 9:28 AM   -  To page go to [email or web address]

## 2022-08-28 DIAGNOSIS — U071 COVID-19: Secondary | ICD-10-CM | POA: Diagnosis not present

## 2022-08-28 LAB — COMPREHENSIVE METABOLIC PANEL
ALT: 21 U/L (ref 0–44)
AST: 29 U/L (ref 15–41)
Albumin: 2.8 g/dL — ABNORMAL LOW (ref 3.5–5.0)
Alkaline Phosphatase: 46 U/L (ref 38–126)
Anion gap: 9 (ref 5–15)
BUN: 18 mg/dL (ref 8–23)
CO2: 26 mmol/L (ref 22–32)
Calcium: 8.3 mg/dL — ABNORMAL LOW (ref 8.9–10.3)
Chloride: 99 mmol/L (ref 98–111)
Creatinine, Ser: 1.04 mg/dL — ABNORMAL HIGH (ref 0.44–1.00)
GFR, Estimated: 52 mL/min — ABNORMAL LOW (ref >60)
Glucose, Bld: 91 mg/dL (ref 70–99)
Potassium: 3.7 mmol/L (ref 3.5–5.1)
Sodium: 134 mmol/L — ABNORMAL LOW (ref 135–145)
Total Bilirubin: 0.5 mg/dL (ref 0.3–1.2)
Total Protein: 5.1 g/dL — ABNORMAL LOW (ref 6.5–8.1)

## 2022-08-28 LAB — C-REACTIVE PROTEIN: CRP: <0.5 mg/dL (ref <1.0)

## 2022-08-28 LAB — CBC WITH DIFFERENTIAL/PLATELET
Abs Immature Granulocytes: 0.01 10*3/uL (ref 0.00–0.07)
Basophils Absolute: 0 10*3/uL (ref 0.0–0.1)
Basophils Relative: 0 %
Eosinophils Absolute: 0 10*3/uL (ref 0.0–0.5)
Eosinophils Relative: 0 %
HCT: 31.8 % — ABNORMAL LOW (ref 36.0–46.0)
Hemoglobin: 10.9 g/dL — ABNORMAL LOW (ref 12.0–15.0)
Immature Granulocytes: 0 %
Lymphocytes Relative: 32 %
Lymphs Abs: 1.4 10*3/uL (ref 0.7–4.0)
MCH: 31.1 pg (ref 26.0–34.0)
MCHC: 34.3 g/dL (ref 30.0–36.0)
MCV: 90.6 fL (ref 80.0–100.0)
Monocytes Absolute: 0.6 10*3/uL (ref 0.1–1.0)
Monocytes Relative: 13 %
Neutro Abs: 2.5 10*3/uL (ref 1.7–7.7)
Neutrophils Relative %: 55 %
Platelets: 169 10*3/uL (ref 150–400)
RBC: 3.51 MIL/uL — ABNORMAL LOW (ref 3.87–5.11)
RDW: 13.5 % (ref 11.5–15.5)
WBC: 4.5 10*3/uL (ref 4.0–10.5)
nRBC: 0 % (ref 0.0–0.2)

## 2022-08-28 LAB — OSMOLALITY: Osmolality: 283 mOsm/kg (ref 275–295)

## 2022-08-28 LAB — MAGNESIUM: Magnesium: 2.3 mg/dL (ref 1.7–2.4)

## 2022-08-28 LAB — PROCALCITONIN: Procalcitonin: <0.1 ng/mL

## 2022-08-28 LAB — BRAIN NATRIURETIC PEPTIDE: B Natriuretic Peptide: 241.9 pg/mL — ABNORMAL HIGH (ref 0.0–100.0)

## 2022-08-28 MED ORDER — METHYLPREDNISOLONE SODIUM SUCC 40 MG IJ SOLR
30.0000 mg | Freq: Every day | INTRAMUSCULAR | Status: DC
  Administered 2022-08-28: 30 mg via INTRAVENOUS
  Filled 2022-08-28 (×2): qty 1

## 2022-08-28 MED ORDER — AZITHROMYCIN 500 MG PO TABS
250.0000 mg | ORAL_TABLET | Freq: Every day | ORAL | Status: AC
  Administered 2022-08-28 – 2022-08-30 (×3): 250 mg via ORAL
  Filled 2022-08-28 (×3): qty 1

## 2022-08-28 NOTE — Plan of Care (Signed)
  Problem: Education: Goal: Knowledge of disease or condition will improve Outcome: Progressing Goal: Knowledge of secondary prevention will improve (MUST DOCUMENT ALL) Outcome: Progressing Goal: Knowledge of patient specific risk factors will improve [place] N/A or DELETE if not current risk factor) Outcome: Progressing   Problem: Ischemic Stroke/TIA Tissue Perfusion: Goal: Complications of ischemic stroke/TIA will be minimized Outcome: Progressing   Problem: Coping: Goal: Will verbalize positive feelings about self Outcome: Progressing Goal: Will identify appropriate support needs Outcome: Progressing   Problem: Health Behavior/Discharge Planning: Goal: Ability to manage health-related needs will improve Outcome: Progressing Goal: Goals will be collaboratively established with patient/family Outcome: Progressing   Problem: Self-Care: Goal: Ability to participate in self-care as condition permits will improve Outcome: Progressing Goal: Verbalization of feelings and concerns over difficulty with self-care will improve Outcome: Progressing Goal: Ability to communicate needs accurately will improve Outcome: Progressing   Problem: Nutrition: Goal: Risk of aspiration will decrease Outcome: Progressing Goal: Dietary intake will improve Outcome: Progressing   Problem: Education: Goal: Knowledge of risk factors and measures for prevention of condition will improve Outcome: Progressing   Problem: Coping: Goal: Psychosocial and spiritual needs will be supported Outcome: Progressing   Problem: Respiratory: Goal: Will maintain a patent airway Outcome: Progressing Goal: Complications related to the disease process, condition or treatment will be avoided or minimized Outcome: Progressing   Problem: Education: Goal: Knowledge of General Education information will improve Description: Including pain rating scale, medication(s)/side effects and non-pharmacologic comfort  measures Outcome: Progressing   Problem: Health Behavior/Discharge Planning: Goal: Ability to manage health-related needs will improve Outcome: Progressing   Problem: Clinical Measurements: Goal: Ability to maintain clinical measurements within normal limits will improve Outcome: Progressing Goal: Will remain free from infection Outcome: Progressing Goal: Diagnostic test results will improve Outcome: Progressing Goal: Respiratory complications will improve Outcome: Progressing Goal: Cardiovascular complication will be avoided Outcome: Progressing   Problem: Activity: Goal: Risk for activity intolerance will decrease Outcome: Progressing   Problem: Nutrition: Goal: Adequate nutrition will be maintained Outcome: Progressing   Problem: Coping: Goal: Level of anxiety will decrease Outcome: Progressing   Problem: Elimination: Goal: Will not experience complications related to bowel motility Outcome: Progressing Goal: Will not experience complications related to urinary retention Outcome: Progressing   Problem: Pain Managment: Goal: General experience of comfort will improve Outcome: Progressing   Problem: Safety: Goal: Ability to remain free from injury will improve Outcome: Progressing   Problem: Skin Integrity: Goal: Risk for impaired skin integrity will decrease Outcome: Progressing

## 2022-08-28 NOTE — Evaluation (Signed)
Occupational Therapy Evaluation Patient Details Name: Tiffany Black MRN: [id number] DOB: August 05, 1934 Today's Date: 08/28/2022   History of Present Illness Patient is 88 yo female presenting from SNF to the ED with COVID,and SOB on 08/26/22. Patient also with Afib. PMHx of dementia, cataracts, strokes in 2015 and 2016, HLD, mild carotid atherosclerotic disease by ultrasound, HTN, insomnia and persistent atrial fibrillation (on Xarelto).   Clinical Impression   Prior to this admission, patient at SNF for rehab. Patient also with recent admission to hospital on 08/15/22. Currently, patient presenting with decreased activity tolerance, poor cognition (baseline) and need for increased assist with ADLs. Patient mod A of 2 to complete stand pivot to chair (more due to motivation than capability) and need for min-mod A for ADL management. OT recommending return to SNF for rehab unless patient has 24/7 assist at home. OT will continue to follow.      Recommendations for follow up therapy are one component of a multi-disciplinary discharge planning process, led by the attending physician.  Recommendations may be updated based on patient status, additional functional criteria and insurance authorization.   Follow Up Recommendations  Skilled nursing-short term rehab (<3 hours/day)     Assistance Recommended at Discharge Frequent or constant Supervision/Assistance  Patient can return home with the following A lot of help with bathing/dressing/bathroom;Direct supervision/assist for medications management;Direct supervision/assist for financial management;Assistance with cooking/housework;Assist for transportation;Help with stairs or ramp for entrance;A lot of help with walking and/or transfers    Functional Status Assessment  Patient has had a recent decline in their functional status and demonstrates the ability to make significant improvements in function in a reasonable and predictable amount of time.   Equipment Recommendations  BSC/3in1    Recommendations for Other Services       Precautions / Restrictions Precautions Precautions: Fall;Other (comment) Precaution Comments: Covid Restrictions Weight Bearing Restrictions: No      Mobility Bed Mobility Overal bed mobility: Needs Assistance Bed Mobility: Supine to Sit     Supine to sit: Min assist, Mod assist     General bed mobility comments: Slow movign to get up; accepted handheld assis tto pull to sit;  Light mod assist and use of bed pad to square off hips at EOB    Transfers Overall transfer level: Needs assistance Equipment used: 2 person hand held assist Transfers: Sit to/from Stand, Bed to chair/wheelchair/BSC Sit to Stand: Mod assist, +2 safety/equipment     Step pivot transfers: Mod assist, +2 safety/equipment     General transfer comment: Mod asist to intiate and rise; Light Mod assist to steady with pivot steps bed to recliner      Balance Overall balance assessment: Needs assistance Sitting-balance support: Bilateral upper extremity supported, Feet supported Sitting balance-Leahy Scale: Fair     Standing balance support: During functional activity, Bilateral upper extremity supported Standing balance-Leahy Scale: Poor Standing balance comment: assist needed to maintain standing balance                           ADL either performed or assessed with clinical judgement   ADL Overall ADL's : Needs assistance/impaired Eating/Feeding: Set up;Sitting   Grooming: Set up;Sitting   Upper Body Bathing: Minimal assistance;Sitting   Lower Body Bathing: Moderate assistance;Maximal assistance;Sit to/from stand;Sitting/lateral leans   Upper Body Dressing : Minimal assistance;Sitting   Lower Body Dressing: Total assistance;Sitting/lateral leans;Bed level Lower Body Dressing Details (indicate cue type and reason): assist for socks EOB due to  pt poor balance Toilet Transfer: Minimal  assistance;Stand-pivot;Cueing for safety;Cueing for sequencing;BSC/3in1 Toilet Transfer Details (indicate cue type and reason): simulated with transfer to recliner         Functional mobility during ADLs: Minimal assistance;Rolling walker (2 wheels);Cueing for sequencing;Cueing for safety General ADL Comments: Patient presenting with decreased activity tolerancr, poor cognition (baseline) and need for increased assist with ADLs     Vision Baseline Vision/History: 1 Wears glasses Ability to See in Adequate Light: 0 Adequate Patient Visual Report: No change from baseline       Perception     Praxis      Pertinent Vitals/Pain Pain Assessment Pain Assessment: Faces Faces Pain Scale: Hurts little more Pain Location: R flank, R sided mid to low back pain Pain Descriptors / Indicators: Grimacing (pointing out pain to PT/OT)     Hand Dominance Right   Extremity/Trunk Assessment Upper Extremity Assessment Upper Extremity Assessment: Generalized weakness   Lower Extremity Assessment Lower Extremity Assessment: Defer to PT evaluation   Cervical / Trunk Assessment Cervical / Trunk Assessment: Kyphotic   Communication Communication Communication: No difficulties   Cognition Arousal/Alertness: Awake/alert Behavior During Therapy: Flat affect Overall Cognitive Status: History of cognitive impairments - at baseline                                 General Comments: hx of dementia, more appropriate responses in conversation with some awareness of being in hospital. Able to follow commands for simple tasks     General Comments  session conducted on room air; O2 sats remained geater than or equal to 92% throughout    Exercises     Shoulder Instructions      Home Living Family/patient expects to be discharged to:: Skilled nursing facility                                 Additional Comments: Has been at [facility] since dc from last admission on  12/5      Prior Functioning/Environment Prior Level of Function : History of Falls (last six months);Patient poor historian/Family not available             Mobility Comments: reports no use of AD though fearful of falling ADLs Comments: reports able to complete ADLs though unsure of baseline due to poor historian        OT Problem List: Decreased strength;Decreased activity tolerance;Impaired balance (sitting and/or standing);Decreased cognition;Decreased safety awareness;Decreased knowledge of use of DME or AE      OT Treatment/Interventions: Self-care/ADL training;Therapeutic exercise;Energy conservation;DME and/or AE instruction;Therapeutic activities;Patient/family education    OT Goals(Current goals can be found in the care plan section) Acute Rehab OT Goals Patient Stated Goal: to get back home OT Goal Formulation: Patient unable to participate in goal setting Time For Goal Achievement: 09/11/22 Potential to Achieve Goals: Fair ADL Goals Pt Will Perform Lower Body Bathing: with set-up;sitting/lateral leans;sit to/from stand Pt Will Perform Lower Body Dressing: with set-up;sit to/from stand;sitting/lateral leans Pt Will Transfer to Toilet: with set-up;ambulating Pt Will Perform Toileting - Clothing Manipulation and hygiene: with set-up;sitting/lateral leans;sit to/from stand Additional ADL Goal #1: Patient will be able to follow 1 step commands consistently to promote increased independence with ADLs.  OT Frequency: Min 2X/week    Co-evaluation PT/OT/SLP Co-Evaluation/Treatment: Yes Reason for Co-Treatment: To address functional/ADL transfers PT goals addressed during session: Mobility/safety  with mobility OT goals addressed during session: ADL's and self-care      AM-PAC OT "6 Clicks" Daily Activity     Outcome Measure Help from another person eating meals?: A Little Help from another person taking care of personal grooming?: A Little Help from another person  toileting, which includes using toliet, bedpan, or urinal?: A Lot Help from another person bathing (including washing, rinsing, drying)?: A Lot Help from another person to put on and taking off regular upper body clothing?: A Little Help from another person to put on and taking off regular lower body clothing?: A Lot 6 Click Score: 15   End of Session Nurse Communication: Mobility status  Activity Tolerance: Patient limited by fatigue Patient left: in chair;with call bell/phone within reach;with chair alarm set  OT Visit Diagnosis: Unsteadiness on feet (R26.81);Other abnormalities of gait and mobility (R26.89);Other symptoms and signs involving cognitive function                Time: 1307-1330 OT Time Calculation (min): 23 min Charges:  OT General Charges $OT Visit: 1 Visit OT Evaluation $OT Eval Moderate Complexity: 1 Mod  Corinne Ports E. Jinelle Butchko, OTR/L Acute Rehabilitation Services [phone]   Ascencion Dike 08/28/2022, 3:59 PM

## 2022-08-28 NOTE — Progress Notes (Signed)
CSW left voicemail for patient's son, Gerald Stabs, to make him aware of plan for her to return to [facility] for rehab. CSW will initiated insurance authorization process once therapy notes are in.  Gilmore Laroche, MSW, [facility]

## 2022-08-28 NOTE — Progress Notes (Signed)
PROGRESS NOTE                                                                                                                                                                                                             Patient Demographics:    Tiffany Black, is a 88 y.o. female, DOB - February 10, 1934, JOI:[id number]  Outpatient Primary MD for the patient is Ginger Organ., MD    LOS - 1  Admit date - 08/26/2022    Chief Complaint  Patient presents with   Shortness of Breath   Tachycardia       Brief Narrative (HPI from H&P)   88 y.o. female with medical history significant for paroxysmal atrial fibrillation chronically anticoagulated on Xarelto, dementia with baseline confusion, sent hypertension, stage IIIa CKD with baseline creatinine 1.1 - 1.4, anemia of chronic disease with baseline hemoglobin around 12 who lives at [facility] and complained of some shortness of breath, she was brought to the ER where she was found to have COVID-19 pneumonia and admitted to the hospital.  She was recently hospitalized to the hospital for acute stroke with expressive aphasia and was found to have A-fib that admission.   Subjective:   Patient in bed, appears comfortable, denies any headache, no fever, no chest pain or pressure, no shortness of breath , no abdominal pain. No new focal weakness.     Assessment  & Plan :   #) Hypoxic respiratory failure due to COVID-19 infection.  Much improved after supportive care continue steroids, much improved and on room air, advance activity, encouraged to sit up in chair use I-S and flutter valve for pulmonary toiletry.  Will monitor inflammatory markers.  Overall stable.  Continue to monitor.  Patient and family have refused remdesivir and as it will be stopped.    #) Chronic atrial fibrillation with RVR, [place] vas 2 score of greater than 5: Increased Lopressor dose, stable TSH, continue Xarelto for  anticoagulation and monitor.  Rate much improved now.    #) Generalized weakness: Due to COVID-19 infection.  Supportive care, PT OT and monitor.   #) Ground-level mechanical fall: As described above, presents with unwitnessed fall, without any reported associated loss of consciousness, with fall reported to ground-level mechanical in nature, likely due to generalized weakness, CT head stable, PT OT and advance activity.  Monitor.  #) Mild hyponatremia.  Appears to be euvolemic, question SIADH, hold IV fluids, sodium is dropping with IV fluids, check urine osmolality, urine electrolytes along with serum osmolality.  She had refused labs earlier this morning but has now agreed, will monitor the results.   #) Essential Hypertension: documented h/o such, with outpatient antihypertensive regimen including Diovan, felodipine, and metoprolol tartrate.  Medications adjusted placed on higher dose beta-blocker along with Norvasc and as needed hydralazine monitor and adjust    #) CKD Stage 3A: Hydrate and monitor.   #) Anemia of chronic disease: Baseline hemoglobin around 12, monitor no signs of overt bleeding.  #) Mild metabolic encephalopathy.  Question underlying dementia also recent stroke with expressive aphasia, monitor with supportive care.       Condition - Fair  Family Communication  : son Gerald Stabs [phone] on 08/28/22  Code Status : Full code  Consults  : None  PUD Prophylaxis : Pepcid.   Procedures  :            Disposition Plan  :    Status is: Inpatient   DVT Prophylaxis  :    SCDs Start: 08/27/22 0204 Rivaroxaban (XARELTO) tablet 15 mg    Lab Results  Component Value Date   PLT 151 08/27/2022    Diet :  Diet Order             Diet regular Room service appropriate? Yes; Fluid consistency: Thin  Diet effective now                    Inpatient Medications  Scheduled Meds:  amLODipine  5 mg Oral Daily   ezetimibe  10 mg Oral Daily   famotidine  20  mg Oral Daily   methylPREDNISolone (SOLU-MEDROL) injection  40 mg Intravenous Daily   metoprolol tartrate  100 mg Oral BID   QUEtiapine  50 mg Oral QHS   Rivaroxaban  15 mg Oral Q supper   Continuous Infusions:  azithromycin 500 mg (08/27/22 2239)   cefTRIAXone (ROCEPHIN)  IV 1 g (08/27/22 2142)   PRN Meds:.acetaminophen **OR** acetaminophen, hydrALAZINE, levalbuterol, melatonin, metoprolol tartrate    Objective:   Vitals:   08/27/22 2000 08/27/22 2143 08/28/22 0000 08/28/22 0400  BP:  (!) 168/109 114/74   Pulse:   76   Resp:   16   Temp: 98.5 F (36.9 C)  98.5 F (36.9 C) 98.4 F (36.9 C)  TempSrc:   Oral Oral  SpO2:      Weight:        Wt Readings from Last 3 Encounters:  08/26/22 60.2 kg  08/15/22 60.2 kg  07/14/22 67 kg     Intake/Output Summary (Last 24 hours) at 08/28/2022 0813 Last data filed at 08/28/2022 0300 Gross per 24 hour  Intake 350 ml  Output 800 ml  Net -450 ml     Physical Exam  Awake but confused No new F.N deficits, Normal affect [place].AT,PERRAL Supple Neck, No JVD,   Symmetrical Chest wall movement, Good air movement bilaterally, CTAB RRR,No Gallops, Rubs or new Murmurs,  +ve B.Sounds, Abd Soft, No tenderness,   No Cyanosis, Clubbing or edema       Data Review:    Recent Labs  Lab 08/26/22 2155 08/27/22 0411  WBC 4.6 3.9*  HGB 11.6* 11.1*  HCT 34.4* 33.8*  PLT 156 151  MCV 93.5 95.8  MCH 31.5 31.4  MCHC 33.7 32.8  RDW 13.4 13.5  LYMPHSABS 0.5* 0.5*  MONOABS 0.5 0.5  EOSABS 0.0 0.0  BASOSABS 0.0 0.0    Recent Labs  Lab 08/26/22 2155 08/26/22 2350 08/27/22 0411 08/27/22 2038  NA 133*  --  132*  --   K 4.0  --  3.4*  --   CL 100  --  99  --   CO2 22  --  21*  --   ANIONGAP 11  --  12  --   GLUCOSE 140*  --  116*  --   BUN 13  --  10  --   CREATININE 1.11*  --  0.89  --   AST  --   --  32  --   ALT  --   --  22  --   ALKPHOS  --   --  51  --   BILITOT  --   --  0.4  --   ALBUMIN  --   --  3.2*  --   CRP  --    --   --  0.6  PROCALCITON  --   --  <0.10  --   LATICACIDVEN  --  1.5  --   --   TSH  --   --  0.900  --   BNP  --   --  382.3* 415.7*  MG  --   --  1.5*  --   CALCIUM 8.6*  --  8.0*  --       Recent Labs  Lab 08/26/22 2155 08/26/22 2350 08/27/22 0411 08/27/22 2038  CRP  --   --   --  0.6  PROCALCITON  --   --  <0.10  --   LATICACIDVEN  --  1.5  --   --   TSH  --   --  0.900  --   BNP  --   --  382.3* 415.7*  MG  --   --  1.5*  --   CALCIUM 8.6*  --  8.0*  --     Lab Results  Component Value Date   HGBA1C 5.7 (H) 08/10/2022    Recent Labs    08/27/22 0411  TSH 0.900   Radiology Reports CT Head Wo Contrast  Result Date: 08/27/2022 CLINICAL DATA:  Head trauma EXAM: CT HEAD WITHOUT CONTRAST TECHNIQUE: Contiguous axial images were obtained from the base of the skull through the vertex without intravenous contrast. RADIATION DOSE REDUCTION: This exam was performed according to the departmental dose-optimization program which includes automated exposure control, adjustment of the mA and/or kV according to patient size and/or use of iterative reconstruction technique. COMPARISON:  None Available. FINDINGS: Brain: There is no mass, hemorrhage or extra-axial collection. There is generalized atrophy without lobar predilection. Hypodensity of the white matter is most commonly associated with chronic microvascular disease. Vascular: No abnormal hyperdensity of the major intracranial arteries or dural venous sinuses. No intracranial atherosclerosis. Skull: The visualized skull base, calvarium and extracranial soft tissues are normal. Sinuses/Orbits: Left maxillary sinus mucosal thickening. The orbits are normal. IMPRESSION: 1. No acute intracranial abnormality. 2. Generalized atrophy and findings of chronic microvascular disease. Electronically Signed   By: Ulyses Jarred M.D.   On: 08/27/2022 00:35   DG Chest Port 1 View  Result Date: 08/26/2022 CLINICAL DATA:  Shortness of breath  EXAM: PORTABLE CHEST 1 VIEW COMPARISON:  Radiographs 08/09/2022 FINDINGS: Cardiomegaly. Aortic atherosclerotic calcification. Bibasilar atelectasis or infiltrates. No definite pleural effusion. No pneumothorax. No acute osseous abnormality. IMPRESSION: Bibasilar atelectasis or infiltrates. Electronically Signed  By: Placido Sou M.D.   On: 08/26/2022 22:09      Signature  -   Lala Lund M.D on 08/28/2022 at 8:13 AM   -  To page go to [email or web address]

## 2022-08-28 NOTE — Progress Notes (Signed)
Refused AM labs, lab tech plan to follow up later in the am.

## 2022-08-28 NOTE — Evaluation (Signed)
Physical Therapy Evaluation Patient Details Name: Tiffany Black MRN: [id number] DOB: [birth year] Today's Date: 08/28/2022  History of Present Illness  Patient is 88 yo female presenting from SNF to the ED with COVID,and SOB on 08/26/22. Patient also with Afib. PMHx of dementia, cataracts, strokes in 2015 and 2016, HLD, mild carotid atherosclerotic disease by ultrasound, HTN, insomnia and persistent atrial fibrillation (on Xarelto).  Clinical Impression   Pt admitted with above diagnosis. Has been at SNF for rehab since last admission a few weeks ago; Unreliable historian, but it sounds like pt lived with her son prior to that previous admission, and walked household distances (question with or without a RW or other assistive device); Presents to PT with generalized weakness, decr activity tolerance, high fall risk;  Needing overall mod assist to stand and take pivot steps bed to recliner; Recommend return to rehab to continue rehab course; Would like more info re: how pt was managing at home with family; Pt currently with functional limitations due to the deficits listed below (see PT Problem List). Pt will benefit from skilled PT to increase their independence and safety with mobility to allow discharge to the venue listed below.          Recommendations for follow up therapy are one component of a multi-disciplinary discharge planning process, led by the attending physician.  Recommendations may be updated based on patient status, additional functional criteria and insurance authorization.  Follow Up Recommendations Skilled nursing-short term rehab (<3 hours/day) Can patient physically be transported by private vehicle:  (potentially)    Assistance Recommended at Discharge Frequent or constant Supervision/Assistance  Patient can return home with the following  A lot of help with walking and/or transfers;A lot of help with bathing/dressing/bathroom;Assistance with cooking/housework;Direct  supervision/assist for medications management;Direct supervision/assist for financial management;Assist for transportation;Help with stairs or ramp for entrance    Equipment Recommendations Other (comment) (and 3in1 if doesn't already have)  Recommendations for Other Services       Functional Status Assessment Patient has had a recent decline in their functional status and demonstrates the ability to make significant improvements in function in a reasonable and predictable amount of time.     Precautions / Restrictions Precautions Precautions: Fall;Other (comment) Precaution Comments: Covid Restrictions Weight Bearing Restrictions: No      Mobility  Bed Mobility Overal bed mobility: Needs Assistance Bed Mobility: Supine to Sit     Supine to sit: Min assist, Mod assist     General bed mobility comments: Slow movign to get up; accepted handheld assis tto pull to sit;  Light mod assist and use of bed pad to square off hips at EOB    Transfers Overall transfer level: Needs assistance Equipment used: 2 person hand held assist Transfers: Sit to/from Stand, Bed to chair/wheelchair/BSC Sit to Stand: Mod assist, +2 safety/equipment   Step pivot transfers: Mod assist, +2 safety/equipment       General transfer comment: Mod asist to intiate and rise; Light Mod assist to steady with pivot steps bed to recliner    Ambulation/Gait                  Stairs            Wheelchair Mobility    Modified Rankin (Stroke Patients Only)       Balance     Sitting balance-Leahy Scale: Fair       Standing balance-Leahy Scale: Poor  Pertinent Vitals/Pain Pain Assessment Pain Assessment: Faces Faces Pain Scale: Hurts little more Pain Location: R flank, R sided mid to low back pain Pain Descriptors / Indicators: Grimacing (pointing out pain to PT/OT) Pain Intervention(s): Monitored during session, Repositioned, Heat applied     Home Living Family/patient expects to be discharged to:: Skilled nursing facility                   Additional Comments: Has been at [facility] since dc from last admission on 12/5    Prior Function Prior Level of Function : History of Falls (last six months);Patient poor historian/Family not available             Mobility Comments: reports no use of AD though fearful of falling ADLs Comments: reports able to complete ADLs though unsure of baseline due to poor historian     Hand Dominance   Dominant Hand: Right    Extremity/Trunk Assessment   Upper Extremity Assessment Upper Extremity Assessment: Defer to OT evaluation    Lower Extremity Assessment Lower Extremity Assessment: Generalized weakness       Communication   Communication: No difficulties  Cognition Arousal/Alertness: Awake/alert Behavior During Therapy: Flat affect Overall Cognitive Status: History of cognitive impairments - at baseline                                 General Comments: hx of dementia, more appropriate responses in conversation with some awareness of being in hospital. Able to follow commands for simple tasks        General Comments General comments (skin integrity, edema, etc.): session conducted on room air; O2 sats remained geater than or equal to 92% throughout    Exercises     Assessment/Plan    PT Assessment Patient needs continued PT services  PT Problem List Decreased strength;Decreased mobility;Decreased balance;Decreased knowledge of use of DME;Pain;Decreased activity tolerance;Decreased cognition;Decreased safety awareness       PT Treatment Interventions DME instruction;Therapeutic activities;Gait training;Therapeutic exercise;Patient/family education;Balance training;Functional mobility training;Neuromuscular re-education    PT Goals (Current goals can be found in the Care Plan section)  Acute Rehab PT Goals Patient Stated Goal: did not  state; agreeable to getting up PT Goal Formulation: Patient unable to participate in goal setting Time For Goal Achievement: 09/11/22 Potential to Achieve Goals: Good    Frequency Min 2X/week     Co-evaluation PT/OT/SLP Co-Evaluation/Treatment: Yes Reason for Co-Treatment: To address functional/ADL transfers PT goals addressed during session: Mobility/safety with mobility         AM-PAC PT "6 Clicks" Mobility  Outcome Measure Help needed turning from your back to your side while in a flat bed without using bedrails?: A Little Help needed moving from lying on your back to sitting on the side of a flat bed without using bedrails?: A Lot Help needed moving to and from a bed to a chair (including a wheelchair)?: A Lot Help needed standing up from a chair using your arms (e.g., wheelchair or bedside chair)?: Total Help needed to walk in hospital room?: Total Help needed climbing 3-5 steps with a railing? : Total 6 Click Score: 10    End of Session Equipment Utilized During Treatment: Gait belt Activity Tolerance: Patient tolerated treatment well Patient left: in chair;with call bell/phone within reach;with chair alarm set Nurse Communication: Mobility status PT Visit Diagnosis: Other abnormalities of gait and mobility (R26.89);Muscle weakness (generalized) (M62.81);History of falling (Z91.81)  Time: 1305-1330 PT Time Calculation (min) (ACUTE ONLY): 25 min   Charges:   PT Evaluation $PT Eval Low Complexity: 1 Low          Roney Marion, PT  Acute Rehabilitation Services Office [phone]   Colletta [place] 08/28/2022, 2:59 PM

## 2022-08-29 DIAGNOSIS — U071 COVID-19: Secondary | ICD-10-CM | POA: Diagnosis not present

## 2022-08-29 LAB — CBC WITH DIFFERENTIAL/PLATELET
Abs Immature Granulocytes: 0.02 10*3/uL (ref 0.00–0.07)
Basophils Absolute: 0 10*3/uL (ref 0.0–0.1)
Basophils Relative: 0 %
Eosinophils Absolute: 0 10*3/uL (ref 0.0–0.5)
Eosinophils Relative: 0 %
HCT: 31.6 % — ABNORMAL LOW (ref 36.0–46.0)
Hemoglobin: 10.9 g/dL — ABNORMAL LOW (ref 12.0–15.0)
Immature Granulocytes: 0 %
Lymphocytes Relative: 31 %
Lymphs Abs: 1.4 10*3/uL (ref 0.7–4.0)
MCH: 31.6 pg (ref 26.0–34.0)
MCHC: 34.5 g/dL (ref 30.0–36.0)
MCV: 91.6 fL (ref 80.0–100.0)
Monocytes Absolute: 0.6 10*3/uL (ref 0.1–1.0)
Monocytes Relative: 13 %
Neutro Abs: 2.5 10*3/uL (ref 1.7–7.7)
Neutrophils Relative %: 56 %
Platelets: 176 10*3/uL (ref 150–400)
RBC: 3.45 MIL/uL — ABNORMAL LOW (ref 3.87–5.11)
RDW: 13.5 % (ref 11.5–15.5)
WBC: 4.5 10*3/uL (ref 4.0–10.5)
nRBC: 0 % (ref 0.0–0.2)

## 2022-08-29 LAB — COMPREHENSIVE METABOLIC PANEL
ALT: 19 U/L (ref 0–44)
AST: 27 U/L (ref 15–41)
Albumin: 3.1 g/dL — ABNORMAL LOW (ref 3.5–5.0)
Alkaline Phosphatase: 50 U/L (ref 38–126)
Anion gap: 10 (ref 5–15)
BUN: 24 mg/dL — ABNORMAL HIGH (ref 8–23)
CO2: 25 mmol/L (ref 22–32)
Calcium: 8.7 mg/dL — ABNORMAL LOW (ref 8.9–10.3)
Chloride: 101 mmol/L (ref 98–111)
Creatinine, Ser: 1.16 mg/dL — ABNORMAL HIGH (ref 0.44–1.00)
GFR, Estimated: 46 mL/min — ABNORMAL LOW (ref >60)
Glucose, Bld: 83 mg/dL (ref 70–99)
Potassium: 3.7 mmol/L (ref 3.5–5.1)
Sodium: 136 mmol/L (ref 135–145)
Total Bilirubin: 0.5 mg/dL (ref 0.3–1.2)
Total Protein: 5.6 g/dL — ABNORMAL LOW (ref 6.5–8.1)

## 2022-08-29 LAB — C-REACTIVE PROTEIN: CRP: <0.5 mg/dL (ref <1.0)

## 2022-08-29 LAB — CULTURE, BLOOD (ROUTINE X 2): Special Requests: ADEQUATE

## 2022-08-29 LAB — BRAIN NATRIURETIC PEPTIDE: B Natriuretic Peptide: 233.8 pg/mL — ABNORMAL HIGH (ref 0.0–100.0)

## 2022-08-29 LAB — MAGNESIUM: Magnesium: 2.1 mg/dL (ref 1.7–2.4)

## 2022-08-29 LAB — PROCALCITONIN: Procalcitonin: <0.1 ng/mL

## 2022-08-29 MED ORDER — HALOPERIDOL LACTATE 5 MG/ML IJ SOLN: 2.0000 mg | Freq: Once | INTRAMUSCULAR | Status: DC

## 2022-08-29 MED ORDER — METHYLPREDNISOLONE SODIUM SUCC 40 MG IJ SOLR: 20.0000 mg | Freq: Every day | INTRAMUSCULAR | Status: DC

## 2022-08-29 NOTE — Plan of Care (Signed)
  Problem: Education: Goal: Knowledge of disease or condition will improve Outcome: Progressing Goal: Knowledge of secondary prevention will improve (MUST DOCUMENT ALL) Outcome: Progressing Goal: Knowledge of patient specific risk factors will improve [place] N/A or DELETE if not current risk factor) Outcome: Progressing   Problem: Ischemic Stroke/TIA Tissue Perfusion: Goal: Complications of ischemic stroke/TIA will be minimized Outcome: Progressing   Problem: Coping: Goal: Will verbalize positive feelings about self Outcome: Progressing Goal: Will identify appropriate support needs Outcome: Progressing   Problem: Health Behavior/Discharge Planning: Goal: Ability to manage health-related needs will improve Outcome: Progressing Goal: Goals will be collaboratively established with patient/family Outcome: Progressing   Problem: Self-Care: Goal: Ability to participate in self-care as condition permits will improve Outcome: Progressing Goal: Verbalization of feelings and concerns over difficulty with self-care will improve Outcome: Progressing Goal: Ability to communicate needs accurately will improve Outcome: Progressing   Problem: Nutrition: Goal: Risk of aspiration will decrease Outcome: Progressing Goal: Dietary intake will improve Outcome: Progressing   Problem: Education: Goal: Knowledge of risk factors and measures for prevention of condition will improve Outcome: Progressing   Problem: Coping: Goal: Psychosocial and spiritual needs will be supported Outcome: Progressing   Problem: Respiratory: Goal: Will maintain a patent airway Outcome: Progressing Goal: Complications related to the disease process, condition or treatment will be avoided or minimized Outcome: Progressing   Problem: Education: Goal: Knowledge of General Education information will improve Description: Including pain rating scale, medication(s)/side effects and non-pharmacologic comfort  measures Outcome: Progressing   Problem: Health Behavior/Discharge Planning: Goal: Ability to manage health-related needs will improve Outcome: Progressing   Problem: Clinical Measurements: Goal: Ability to maintain clinical measurements within normal limits will improve Outcome: Progressing Goal: Will remain free from infection Outcome: Progressing Goal: Diagnostic test results will improve Outcome: Progressing Goal: Respiratory complications will improve Outcome: Progressing Goal: Cardiovascular complication will be avoided Outcome: Progressing   Problem: Activity: Goal: Risk for activity intolerance will decrease Outcome: Progressing   Problem: Nutrition: Goal: Adequate nutrition will be maintained Outcome: Progressing   Problem: Coping: Goal: Level of anxiety will decrease Outcome: Progressing   Problem: Elimination: Goal: Will not experience complications related to bowel motility Outcome: Progressing Goal: Will not experience complications related to urinary retention Outcome: Progressing   Problem: Pain Managment: Goal: General experience of comfort will improve Outcome: Progressing   Problem: Safety: Goal: Ability to remain free from injury will improve Outcome: Progressing   Problem: Skin Integrity: Goal: Risk for impaired skin integrity will decrease Outcome: Progressing

## 2022-08-29 NOTE — TOC Progression Note (Addendum)
Transition of Care [facility]) - Progression Note    Patient Details  Name: [name] MRN: [id number] Date of Birth: December 20, 1933  Transition of Care [facility]) CM/SW [place], LCSW Phone Number: 08/29/2022, 3:47 PM  Clinical Narrative:    Insurance approval received, [id number], Auth ID# [id number], effective 08/29/2022-08/31/2022.  Per Juliann Pulse at [facility], they do not have a female bed available today since patient did not do a bed hold. They will be able to accept patient tomorrow. MD aware. CSW updated patient's son.    Expected Discharge Plan: Skilled Nursing Facility Barriers to Discharge: Insurance Authorization  Expected Discharge Plan and Services Expected Discharge Plan: [place] In-house Referral: Clinical Social Work   Post Acute Care Choice: [place] Living arrangements for the past 2 months: [place], [place] Expected Discharge Date: 08/29/22                                     Social Determinants of Health (SDOH) Interventions    Readmission Risk Interventions    08/29/2022   10:19 AM  Readmission Risk Prevention Plan  Transportation Screening Complete  Medication Review (RN Care Manager) Complete  PCP or Specialist appointment within 3-5 days of discharge Complete  HRI or Home Care Consult Complete  SW Recovery Care/Counseling Consult Complete  Palliative Care Screening Not [place] Complete

## 2022-08-29 NOTE — TOC Initial Note (Addendum)
Transition of Care [facility]) - Initial/Assessment Note    Patient Details  Name: Tiffany Black MRN: [id number] Date of Birth: [birth year]  Transition of Care [facility]) CM/SW Contact:    Benard Halsted, LCSW Phone Number: 08/29/2022, 10:20 AM  Clinical Narrative:                 10am-Per MD, patient ready for discharge back to SNF today. Per [facility], insurance authorization is required. CSW submitted clinicals for review, Ref# [id number].  12:!2pm-Auth still pending. Spoke with patient's son, Gerald Stabs. He is requesting PTAR for transport.   Expected Discharge Plan: Skilled Nursing Facility Barriers to Discharge: Insurance Authorization   Patient Goals and CMS Choice Patient states their goals for this hospitalization and ongoing recovery are:: Return to SNF CMS Medicare.gov Compare Post Acute Care list provided to:: Patient Represenative (must comment) Choice offered to / list presented to : Adult Children  Expected Discharge Plan and Services Expected Discharge Plan: [place] In-house Referral: Clinical Social Work   Post Acute Care Choice: [place] Living arrangements for the past 2 months: [place], [place] Expected Discharge Date: 08/29/22                                    Prior Living Arrangements/Services Living arrangements for the past 2 months: [place], [place] Lives with:: Adult Children Patient language and need for interpreter reviewed:: Yes Do you feel safe going back to the place where you live?: Yes      Need for Family Participation in Patient Care: Yes (Comment) Care giver support system in place?: Yes (comment)   Criminal Activity/Legal Involvement Pertinent to Current Situation/Hospitalization: No - Comment as needed  Activities of Daily Living      Permission Sought/Granted Permission sought to share information with : Facility Sport and exercise psychologist, Family  Supports Permission granted to share information with : No     Permission granted to share info w AGENCY: [place] granted to share info w Relationship: Son  Permission granted to share info w Contact Information: [phone]  Emotional Assessment Appearance:: Appears stated age Attitude/Demeanor/Rapport: Unable to Assess Affect (typically observed): Unable to Assess Orientation: : Oriented to Self Alcohol / Substance Use: Not Applicable Psych Involvement: No (comment)  Admission diagnosis:  COVID-19 virus infection [U07.1] Patient Active Problem List   Diagnosis Date Noted   COVID-19 virus infection 08/27/2022   Acute respiratory failure with hypoxia ([place]) 08/27/2022   Generalized weakness 08/27/2022   Fall at home, initial encounter 08/27/2022   Acute hyponatremia 08/27/2022   Stage 3a chronic kidney disease (CKD) ([place]) 08/27/2022   Anemia of chronic disease 08/27/2022   Acute encephalopathy 08/10/2022   GAD (generalized anxiety disorder) 08/10/2022   TIA (transient ischemic attack) 08/10/2022   Aphasia 08/09/2022   Atrial fibrillation with RVR ([place]) 09/07/2021   Headache syndrome 07/02/2017   Chest pain 05/23/2015   Essential hypertension    History of stroke 05/10/2015   Dizziness 05/10/2015   Orthostatic hypotension 05/10/2015   CVA (cerebral infarction) 05/10/2015   Hypokalemia 09/30/2013   Vertigo 02/28/2013   Sinusitis 02/28/2013   Anxiety 02/28/2013   Paroxysmal atrial fibrillation with RVR ([place]) 02/28/2013   PAC (premature atrial contraction) 04/10/2012   Abnormal EKG 01/19/2012   Persistent atrial fibrillation ([place]) 02/15/2010   PCP:  Ginger Organ., MD Pharmacy:   [id number]-  [place], [place]. AT [address] [place] [address] Phone: [phone] Fax: [place] [address], [place] - [address] [place] [place] [address] Phone: [phone] Fax: [phone]     Social Determinants of Health (SDOH) Interventions    Readmission Risk Interventions    08/29/2022   10:19 AM  Readmission Risk Prevention Plan  Transportation Screening Complete  Medication Review ([place]) Complete  PCP or Specialist appointment within 3-5 days of discharge Complete  HRI or [place] Complete  SW Recovery Care/Counseling Consult Complete  [place] Complete

## 2022-08-29 NOTE — Discharge Summary (Signed)
Tiffany Black NGE:[id number] DOB: 1933/10/10 DOA: 08/26/2022  PCP: Ginger Organ., MD  Admit date: 08/26/2022  Discharge date: 08/29/2022  Admitted From: SNF   Disposition:  SNF   Recommendations for Outpatient Follow-up:   Follow up with PCP in 1-2 weeks  PCP Please obtain BMP/CBC, 2 view CXR in 1week,  (see Discharge instructions)   PCP Please follow up on the following pending results:    Home Health: None    Equipment/Devices: None  Consultations: None  Discharge Condition: Stable    CODE STATUS: Full    Diet Recommendation: Heart Healthy   Diet Order             Diet - low sodium heart healthy           Diet regular Room service appropriate? Yes; Fluid consistency: Thin  Diet effective now                    Chief Complaint  Patient presents with   Shortness of Breath   Tachycardia     Brief history of present illness from the day of admission and additional interim summary    88 y.o. female with medical history significant for paroxysmal atrial fibrillation chronically anticoagulated on Xarelto, dementia with baseline confusion, sent hypertension, stage IIIa CKD with baseline creatinine 1.1 - 1.4, anemia of chronic disease with baseline hemoglobin around 12 who lives at [facility] and complained of some shortness of breath, she was brought to the ER where she was found to have COVID-19 pneumonia and admitted to the hospital.  She was recently hospitalized to the hospital for acute stroke with expressive aphasia and was found to have A-fib that admission.                                                                  Hospital Course   #) Hypoxic respiratory failure due to COVID-19 infection.  Much improved after supportive care few doses of steroids, symptom-free on room air for 2 days,  will be discharged back on no new medications back to SNF.  Currently encouraged to sit up in chair at SNF and use I-S and flutter valve every hour for pulmonary toiletry.    #) Chronic atrial fibrillation with RVR, [place] vas 2 score of greater than 5: Stable on home dose metoprolol and Xarelto.  Initially was in RVR but that resolved.    #) Generalized weakness: Due to COVID-19 infection.  Supportive care, PT OT and monitor.   #) Ground-level mechanical fall: As described above, presents with unwitnessed fall, without any reported associated loss of consciousness, with fall reported to ground-level mechanical in nature, likely due to generalized weakness, CT head stable, she did well with PT and OT , transfer back to SNF.   #)  Mild hyponatremia.  SIADH, resolved with fluid restriction.   #) Essential Hypertension: No acute issues continue home medications unchanged.    #) CKD Stage 3A: Stable, no acute issues.   #) Anemia of chronic disease: Baseline hemoglobin around 12, monitor no signs of overt bleeding.   #) Mild metabolic encephalopathy.  Question underlying dementia also recent stroke with expressive aphasia, but head CT no acute issues.  Close to her baseline.   Discharge diagnosis     Principal Problem:   COVID-19 virus infection Active Problems:   Paroxysmal atrial fibrillation with RVR (HCC)   Essential hypertension   Acute respiratory failure with hypoxia (HCC)   Generalized weakness   Fall at home, initial encounter   Acute hyponatremia   Stage 3a chronic kidney disease (CKD) (HCC)   Anemia of chronic disease    Discharge instructions    Discharge Instructions     Diet - low sodium heart healthy   Complete by: As directed    Discharge instructions   Complete by: As directed    Follow with Primary MD Ginger Organ., MD in 7 days   Get CBC, CMP, 2 view Chest X ray -  checked next visit with your primary MD or SNF MD   Activity: As tolerated with Full  fall precautions use walker/cane & assistance as needed  Disposition SNF  Diet: Heart Healthy with feeding assistance and aspiration precautions.  Special Instructions: If you have smoked or chewed Tobacco  in the last 2 yrs please stop smoking, stop any regular Alcohol  and or any Recreational drug use.  On your next visit with your primary care physician please Get Medicines reviewed and adjusted.  Please request your Prim.MD to go over all Hospital Tests and Procedure/Radiological results at the follow up, please get all Hospital records sent to your Prim MD by signing hospital release before you go home.  If you experience worsening of your admission symptoms, develop shortness of breath, life threatening emergency, suicidal or homicidal thoughts you must seek medical attention immediately by calling 911 or calling your MD immediately  if symptoms less severe.  You Must read complete instructions/literature along with all the possible adverse reactions/side effects for all the Medicines you take and that have been prescribed to you. Take any new Medicines after you have completely understood and accpet all the possible adverse reactions/side effects.   Increase activity slowly   Complete by: As directed        Discharge Medications   Allergies as of 08/29/2022       Reactions   Hydrochlorothiazide Hives   Penicillins Hives   Has patient had a PCN reaction causing immediate rash, facial/tongue/throat swelling, SOB or lightheadedness with hypotension: Yes Has patient had a PCN reaction causing severe rash involving mucus membranes or skin necrosis: No Has patient had a PCN reaction that required hospitalization No Has patient had a PCN reaction occurring within the last 10 years: No If all of the above answers are "NO", then may proceed with Cephalosporin use.   Atorvastatin Other (See Comments)   "Weakness"   Ciprofloxacin Other (See Comments), Hypertension   Blood pressure  issues- patient doesn't recall if it increased OR decreased it   Doxycycline Diarrhea, Nausea And Vomiting   Hydrocodone-acetaminophen Other (See Comments)   Reaction not recalled   Iodinated Contrast Media Diarrhea, Other (See Comments), Hypertension   Elevated the B/P   Iodine-131 Other (See Comments), Hypertension   Raised the blood pressure  Latex Itching   Metoprolol Other (See Comments)   Reaction not recalled- is tolerating this in 2023   Statins Other (See Comments)   Muscle weakness   Carbamazepine Other (See Comments), Rash   Flushed blood pressure medication out of system   Codeine Nausea And Vomiting   Nickel Rash   Sulfa Antibiotics Hives, Rash   Sulfamethoxazole Hives, Rash   Sulfonamide Derivatives Hives, Rash           Medication List     TAKE these medications    acetaminophen 325 MG tablet Commonly known as: TYLENOL Take 2 tablets (650 mg total) by mouth every 6 (six) hours as needed for mild pain (or Fever >/= 101).   Diovan 160 MG tablet Generic drug: valsartan Take 80 mg by mouth 2 (two) times daily.   ezetimibe 10 MG tablet Commonly known as: ZETIA Take 1 tablet (10 mg total) by mouth daily.   felodipine 10 MG 24 hr tablet Commonly known as: PLENDIL Take 1 tablet (10 mg total) by mouth daily.   FLUoxetine 10 MG capsule Commonly known as: PROZAC Take 10 mg by mouth daily.   Klor-Con M20 20 MEQ tablet Generic drug: potassium chloride SA Take 10 mEq by mouth daily.   Meclizine HCl 25 MG Chew Chew 25 mg by mouth every 8 (eight) hours as needed (for dizziness).   metoprolol tartrate 50 MG tablet Commonly known as: LOPRESSOR Take 1 tablet (50 mg total) by mouth 2 (two) times daily.   QUEtiapine 50 MG tablet Commonly known as: SEROQUEL Take 1 tablet (50 mg total) by mouth at bedtime.   Rivaroxaban 15 MG Tabs tablet Commonly known as: Xarelto Take 1 tablet (15 mg total) by mouth daily with supper.   Tylenol PM Extra Strength 25-500  MG Tabs tablet Generic drug: diphenhydramine-acetaminophen Take 2 tablets by mouth at bedtime.         Follow-up Information     Ginger Organ., MD. Schedule an appointment as soon as possible for a visit in 1 week(s).   Specialty: Internal Medicine Contact information: [address] [place] [address] [phone]                 Major procedures and Radiology Reports - PLEASE review detailed and final reports thoroughly  -       CT Head Wo Contrast  Result Date: 08/27/2022 CLINICAL DATA:  Head trauma EXAM: CT HEAD WITHOUT CONTRAST TECHNIQUE: Contiguous axial images were obtained from the base of the skull through the vertex without intravenous contrast. RADIATION DOSE REDUCTION: This exam was performed according to the departmental dose-optimization program which includes automated exposure control, adjustment of the mA and/or kV according to patient size and/or use of iterative reconstruction technique. COMPARISON:  None Available. FINDINGS: Brain: There is no mass, hemorrhage or extra-axial collection. There is generalized atrophy without lobar predilection. Hypodensity of the white matter is most commonly associated with chronic microvascular disease. Vascular: No abnormal hyperdensity of the major intracranial arteries or dural venous sinuses. No intracranial atherosclerosis. Skull: The visualized skull base, calvarium and extracranial soft tissues are normal. Sinuses/Orbits: Left maxillary sinus mucosal thickening. The orbits are normal. IMPRESSION: 1. No acute intracranial abnormality. 2. Generalized atrophy and findings of chronic microvascular disease. Electronically Signed   By: Ulyses Jarred M.D.   On: 08/27/2022 00:35   DG Chest Port 1 View  Result Date: 08/26/2022 CLINICAL DATA:  Shortness of breath EXAM: PORTABLE CHEST 1 VIEW COMPARISON:  Radiographs 08/09/2022  FINDINGS: Cardiomegaly. Aortic atherosclerotic calcification. Bibasilar atelectasis or  infiltrates. No definite pleural effusion. No pneumothorax. No acute osseous abnormality. IMPRESSION: Bibasilar atelectasis or infiltrates. Electronically Signed   By: Placido Sou M.D.   On: 08/26/2022 22:09   EEG adult  Result Date: 08/11/2022 Lora Havens, MD     08/11/2022 11:33 AM Patient Name: Tiffany Black MRN: [id number] Epilepsy Attending: Lora Havens Referring Physician/Provider: Rosalin Hawking, MD Date: 08/11/2022 Duration: 22.09 mins Patient history: 88 year old female with altered mental status.  EEG to evaluate for seizure. Level of alertness: Awake AEDs during EEG study: None Technical aspects: This EEG study was done with scalp electrodes positioned according to the 10-20 International system of electrode placement. Electrical activity was reviewed with band pass filter of 1-'70Hz'$ , sensitivity of 7 uV/mm, display speed of 9m/sec with a '60Hz'$  notched filter applied as appropriate. EEG data were recorded continuously and digitally stored.  Video monitoring was available and reviewed as appropriate. Description: The posterior dominant rhythm consists of 8 Hz activity of moderate voltage (25-35 uV) seen predominantly in posterior head regions, symmetric and reactive to eye opening and eye closing. EEG showed intermittent generalized 3 to 5 Hz delta and delta slowing. Hyperventilation and photic stimulation were not performed.   ABNORMALITY - Intermittent slow, generalized IMPRESSION: This study is suggestive of mild diffuse encephalopathy, nonspecific etiology.  No seizures or epileptiform discharges were seen throughout the recording. PLora Havens  ECHOCARDIOGRAM COMPLETE  Result Date: 08/10/2022    ECHOCARDIOGRAM REPORT   Patient Name:   JLONEY PETODate of Exam: 08/10/2022 Medical Rec #:  [id number]    Height:       68.0 in Accession #:    [id number]   Weight:       152.8 lb Date of Birth:  2May 20, 1935    BSA:          1.823 m Patient Age:    89years      BP:           146/87  mmHg Patient Gender: F             HR:           68 bpm. Exam Location:  Inpatient Procedure: 2D Echo Indications:     TIA  History:         Patient has prior history of Echocardiogram examinations, most                  recent 04/10/2017. Arrythmias:Atrial Fibrillation; Risk                  Factors:Hypertension.  Sonographer:     WHarvie JuniorReferring Phys:  [id number] MuraDiagnosing Phys: [facility] Sonographer Comments: Technically difficult study due to poor echo windows. Patient confused and trying to get up. IMPRESSIONS  1. Left ventricular ejection fraction, by estimation, is 60 to 65%. The left ventricle has normal function. The left ventricle has no regional wall motion abnormalities. There is mild left ventricular hypertrophy. Indeterminate diastolic filling due to E-A fusion.  2. Right ventricular systolic function is normal. The right ventricular size is normal. There is normal pulmonary artery systolic pressure. The estimated right ventricular systolic pressure is 332.4mmHg.  3. No evidence of mitral valve regurgitation.  4. Aortic valve regurgitation is not visualized. Aortic valve sclerosis/calcification is present, without any evidence of aortic stenosis.  5. The inferior vena cava is normal in size with greater  than 50% respiratory variability, suggesting right atrial pressure of 3 mmHg. Comparison(s): No prior Echocardiogram. FINDINGS  Left Ventricle: Left ventricular ejection fraction, by estimation, is 60 to 65%. The left ventricle has normal function. The left ventricle has no regional wall motion abnormalities. The left ventricular internal cavity size was normal in size. There is  mild left ventricular hypertrophy. Indeterminate diastolic filling due to E-A fusion. Right Ventricle: The right ventricular size is normal. Right ventricular systolic function is normal. There is normal pulmonary artery systolic pressure. The tricuspid regurgitant velocity is 2.84 m/s, and with an  assumed right atrial pressure of 3 mmHg,  the estimated right ventricular systolic pressure is 00.8 mmHg. Left Atrium: Left atrial size was normal in size. Right Atrium: Right atrial size was normal in size. Pericardium: There is no evidence of pericardial effusion. Mitral Valve: Mild mitral annular calcification. No evidence of mitral valve regurgitation. Tricuspid Valve: Tricuspid valve regurgitation is trivial. Aortic Valve: Aortic valve regurgitation is not visualized. Aortic valve sclerosis/calcification is present, without any evidence of aortic stenosis. Pulmonic Valve: Pulmonic valve regurgitation is not visualized. Aorta: The aortic root and ascending aorta are structurally normal, with no evidence of dilitation. Venous: The inferior vena cava is normal in size with greater than 50% respiratory variability, suggesting right atrial pressure of 3 mmHg. IAS/Shunts: The interatrial septum was not well visualized.  LEFT VENTRICLE PLAX 2D LVIDd:         3.50 cm     Diastology LVIDs:         2.70 cm     LV e' medial:    5.87 cm/s LV PW:         1.20 cm     LV E/e' medial:  13.5 LV IVS:        1.20 cm     LV e' lateral:   12.10 cm/s LVOT diam:     1.90 cm     LV E/e' lateral: 6.6 LV SV:         40 LV SV Index:   22 LVOT Area:     2.84 cm  LV Volumes (MOD) LV vol d, MOD A2C: 54.9 ml LV vol d, MOD A4C: 90.6 ml LV vol s, MOD A2C: 24.9 ml LV vol s, MOD A4C: 35.3 ml LV SV MOD A2C:     30.0 ml LV SV MOD A4C:     90.6 ml LV SV MOD BP:      46.0 ml RIGHT VENTRICLE RV Basal diam:  2.90 cm RV Mid diam:    2.20 cm RV S prime:     10.30 cm/s TAPSE (M-mode): 1.3 cm LEFT ATRIUM             Index        RIGHT ATRIUM           Index LA diam:        3.80 cm 2.08 cm/m   RA Area:     17.90 cm LA Vol (A2C):   43.9 ml 24.09 ml/m  RA Volume:   47.00 ml  25.79 ml/m LA Vol (A4C):   55.9 ml 30.67 ml/m LA Biplane Vol: 54.1 ml 29.68 ml/m  AORTIC VALVE             PULMONIC VALVE LVOT Vmax:   78.50 cm/s  PV Vmax:       0.93 m/s LVOT  Vmean:  50.700 cm/s PV Peak grad:  3.4 mmHg LVOT VTI:    0.140 m  AORTA Ao Root diam: 3.30 cm Ao Asc diam:  3.00 cm MITRAL VALVE               TRICUSPID VALVE MV Area (PHT): 3.77 cm    TR Peak grad:   32.3 mmHg MV Decel Time: 201 msec    TR Vmax:        284.00 cm/s MV E velocity: 79.50 cm/s MV A velocity: 26.00 cm/s  SHUNTS MV E/A ratio:  3.06        Systemic VTI:  0.14 m                            Systemic Diam: 1.90 cm Phineas Inches Electronically signed by Phineas Inches Signature Date/Time: 08/10/2022/12:59:48 PM    Final (Updated)    [name] CONTRAST  Result Date: 08/10/2022 CLINICAL DATA:  Stroke suspected.  Expressive aphasia. EXAM: MRI HEAD WITHOUT CONTRAST TECHNIQUE: Multiplanar, multiecho pulse sequences of the brain and surrounding structures were obtained without intravenous contrast. COMPARISON:  MRI Brain 05/10/15, CT Head/neck angiogram 08/09/22 FINDINGS: Limitations: Susceptibility weighted imaging was not performed due to patient intolerance. Brain: No acute infarction, hemorrhage, hydrocephalus, extra-axial collection or mass lesion. Sequela of severe chronic microvascular ischemic change with chronic infarct in the left corona radiata. The degree of chronic microvascular ischemic change has markedly progressed compared to 2016. There is also advanced generalized volume loss, which has also progressed compared to 2016. Volume loss likely has mild frontal and temporal lobe predominance Vascular: Normal flow voids. Skull and upper cervical spine: Normal marrow signal. Sinuses/Orbits: Bilateral lens replacement. Mucosal thickening left maxillary, ethmoid, and frontal sinus. No mastoid effusion. Other: None IMPRESSION: Limitations: Susceptibility weighted imaging was not performed due to patient intolerance. 1. No acute intracranial abnormality. 2. Sequela of severe chronic microvascular ischemic change with chronic infarct in the left corona radiata, progressed compared to 2016. 3. Advanced  generalized volume loss, progressed compared to 2016. Volume loss likely has mild frontal and temporal lobe predominance. Electronically Signed   By: Marin Roberts M.D.   On: 08/10/2022 10:02   DG Chest Portable 1 View  Result Date: 08/09/2022 CLINICAL DATA:  Altered level of consciousness EXAM: PORTABLE CHEST 1 VIEW COMPARISON:  05/30/2022 FINDINGS: Single frontal view of the chest demonstrates an enlarged cardiac silhouette, stable. No acute airspace disease, effusion, or pneumothorax. No acute bony abnormalities. IMPRESSION: 1. Stable chest, no acute process. Electronically Signed   By: Randa Ngo M.D.   On: 08/09/2022 21:15   CT ANGIO HEAD NECK W WO CM (CODE STROKE)  Result Date: 08/09/2022 CLINICAL DATA:  Aphasia EXAM: CT ANGIOGRAPHY HEAD AND NECK TECHNIQUE: Multidetector CT imaging of the head and neck was performed using the standard protocol during bolus administration of intravenous contrast. Multiplanar CT image reconstructions and MIPs were obtained to evaluate the vascular anatomy. Carotid stenosis measurements (when applicable) are obtained utilizing NASCET criteria, using the distal internal carotid diameter as the denominator. RADIATION DOSE REDUCTION: This exam was performed according to the departmental dose-optimization program which includes automated exposure control, adjustment of the mA and/or kV according to patient size and/or use of iterative reconstruction technique. CONTRAST:  43m OMNIPAQUE IOHEXOL 350 MG/ML SOLN COMPARISON:  Head CT same day FINDINGS: CTA NECK FINDINGS SKELETON: There is no bony spinal canal stenosis. No lytic or blastic lesion. OTHER NECK: Normal pharynx, larynx and major salivary glands. No cervical lymphadenopathy. Unremarkable thyroid gland. UPPER CHEST: No pneumothorax or pleural effusion.  No nodules or masses. AORTIC ARCH: There is no calcific atherosclerosis of the aortic arch. There is no aneurysm, dissection or hemodynamically significant stenosis  of the visualized portion of the aorta. Conventional 3 vessel aortic branching pattern. The visualized proximal subclavian arteries are widely patent. RIGHT CAROTID SYSTEM: Normal without aneurysm, dissection or stenosis. LEFT CAROTID SYSTEM: Normal without aneurysm, dissection or stenosis. VERTEBRAL ARTERIES: Codominant configuration. Both origins are clearly patent. There is no dissection, occlusion or flow-limiting stenosis to the skull base (V1-V3 segments). CTA HEAD FINDINGS POSTERIOR CIRCULATION: --Vertebral arteries: Normal V4 segments. --Inferior cerebellar arteries: Normal. --Basilar artery: Normal. --Superior cerebellar arteries: Normal. --Posterior cerebral arteries (PCA): Normal. ANTERIOR CIRCULATION: --Intracranial internal carotid arteries: Normal. --Anterior cerebral arteries (ACA): Normal. Both A1 segments are present. Patent anterior communicating artery (a-comm). --Middle cerebral arteries (MCA): Normal. VENOUS SINUSES: As permitted by contrast timing, patent. ANATOMIC VARIANTS: None Review of the MIP images confirms the above findings. IMPRESSION: 1. No emergent large vessel occlusion or high-grade stenosis of the intracranial arteries. 2. Normal CTA of the neck. Electronically Signed   By: Ulyses Jarred M.D.   On: 08/09/2022 20:54   CT HEAD CODE STROKE WO CONTRAST  Result Date: 08/09/2022 CLINICAL DATA:  Code stroke.  Acute neurologic deficit EXAM: CT HEAD WITHOUT CONTRAST TECHNIQUE: Contiguous axial images were obtained from the base of the skull through the vertex without intravenous contrast. RADIATION DOSE REDUCTION: This exam was performed according to the departmental dose-optimization program which includes automated exposure control, adjustment of the mA and/or kV according to patient size and/or use of iterative reconstruction technique. COMPARISON:  07/08/2022 FINDINGS: Brain: There is no mass, hemorrhage or extra-axial collection. There is generalized atrophy without lobar  predilection. There is hypoattenuation of the periventricular white matter, most commonly indicating chronic ischemic microangiopathy. Vascular: No abnormal hyperdensity of the major intracranial arteries or dural venous sinuses. No intracranial atherosclerosis. Skull: The visualized skull base, calvarium and extracranial soft tissues are normal. Sinuses/Orbits: No fluid levels or advanced mucosal thickening of the visualized paranasal sinuses. No mastoid or middle ear effusion. The orbits are normal. ASPECTS [facility] Stroke Program Early CT Score) - Ganglionic level infarction (caudate, lentiform nuclei, internal capsule, insula, M1-M3 cortex): 7 - Supraganglionic infarction (M4-M6 cortex): 3 Total score (0-10 with 10 being normal): 10 IMPRESSION: 1. No acute intracranial abnormality. 2. ASPECTS is 10. These results were communicated to Dr. Kerney Elbe at 8:16 pm on 08/09/2022 by text page via the [facility] messaging system. Electronically Signed   By: Ulyses Jarred M.D.   On: 08/09/2022 20:16    Micro Results     Recent Results (from the past 240 hour(s))  Resp panel by RT-PCR (RSV, Flu A&B, Covid) Anterior Nasal Swab     Status: Abnormal   Collection Time: 08/26/22  9:55 PM   Specimen: Anterior Nasal Swab  Result Value Ref Range Status   SARS Coronavirus 2 by RT PCR POSITIVE (A) NEGATIVE Final    Comment: (NOTE) SARS-CoV-2 target nucleic acids are DETECTED.  The SARS-CoV-2 RNA is generally detectable in upper respiratory specimens during the acute phase of infection. Positive results are indicative of the presence of the identified virus, but do not rule out bacterial infection or co-infection with other pathogens not detected by the test. Clinical correlation with patient history and other diagnostic information is necessary to determine patient infection status. The expected result is Negative.  Fact Sheet for Patients: EntrepreneurPulse.com.au  Fact Sheet for Healthcare  Providers: IncredibleEmployment.be  This test is not yet  approved or cleared by the [place] and  has been authorized for detection and/or diagnosis of SARS-CoV-2 by FDA under an Emergency Use Authorization (EUA).  This EUA will remain in effect (meaning this test can be used) for the duration of  the COVID-19 declaration under Section 564(b)(1) of the [place] ct, 21 U.S.C. section 360bbb-3(b)(1), unless the authorization is terminated or revoked sooner.     Influenza A by PCR NEGATIVE NEGATIVE Final   Influenza B by PCR NEGATIVE NEGATIVE Final    Comment: (NOTE) The Xpert Xpress SARS-CoV-2/FLU/RSV plus assay is intended as an aid in the diagnosis of influenza from Nasopharyngeal swab specimens and should not be used as a sole basis for treatment. Nasal washings and aspirates are unacceptable for Xpert Xpress SARS-CoV-2/FLU/RSV testing.  Fact Sheet for Patients: EntrepreneurPulse.com.au  Fact Sheet for Healthcare Providers: IncredibleEmployment.be  This test is not yet approved or cleared by the [place] FDA and has been authorized for detection and/or diagnosis of SARS-CoV-2 by FDA under an Emergency Use Authorization (EUA). This EUA will remain in effect (meaning this test can be used) for the duration of the COVID-19 declaration under Section 564(b)(1) of the Act, 21 U.S.C. section 360bbb-3(b)(1), unless the authorization is terminated or revoked.     Resp Syncytial Virus by PCR NEGATIVE NEGATIVE Final    Comment: (NOTE) Fact Sheet for Patients: EntrepreneurPulse.com.au  Fact Sheet for Healthcare Providers: IncredibleEmployment.be  This test is not yet approved or cleared by the [place] FDA and has been authorized for detection and/or diagnosis of SARS-CoV-2 by FDA under an Emergency Use Authorization (EUA). This EUA will remain in effect (meaning this test can be  used) for the duration of the COVID-19 declaration under Section 564(b)(1) of the Act, 21 U.S.C. section 360bbb-3(b)(1), unless the authorization is terminated or revoked.  Performed at [facility], [place] [address] [place], [place] [address]   Culture, blood (Routine X 2) w Reflex to ID Panel     Status: Abnormal   Collection Time: 08/26/22 11:50 PM   Specimen: BLOOD  Result Value Ref Range Status   Specimen Description BLOOD LEFT ANTECUBITAL  Final   Special Requests   Final    BOTTLES DRAWN AEROBIC AND ANAEROBIC Blood Culture adequate volume   Culture  Setup Time   Final    ANAEROBIC BOTTLE ONLY GRAM POSITIVE COCCI IN CLUSTERS CRITICAL RESULT CALLED TO, READ BACK BY AND VERIFIED WITH:  C/ PHARMD M. BITONTI 08/27/22 2100 A. LAFRANCE    Culture (A)  Final    STAPHYLOCOCCUS EPIDERMIDIS THE SIGNIFICANCE OF ISOLATING THIS ORGANISM FROM A SINGLE SET OF BLOOD CULTURES WHEN MULTIPLE SETS ARE DRAWN IS UNCERTAIN. PLEASE NOTIFY THE MICROBIOLOGY DEPARTMENT WITHIN ONE WEEK IF SPECIATION AND SENSITIVITIES ARE REQUIRED. Performed at [facility], [place] [address] [place], [place] [address]    Report Status 08/29/2022 FINAL  Final  Blood Culture ID Panel (Reflexed)     Status: Abnormal   Collection Time: 08/26/22 11:50 PM  Result Value Ref Range Status   Enterococcus faecalis NOT DETECTED NOT DETECTED Final   Enterococcus Faecium NOT DETECTED NOT DETECTED Final   Listeria monocytogenes NOT DETECTED NOT DETECTED Final   Staphylococcus species DETECTED (A) NOT DETECTED Final    Comment: CRITICAL RESULT CALLED TO, READ BACK BY AND VERIFIED WITH:  C/ PHARMD M. BITONTI 08/27/22 2100 A. LAFRANCE    Staphylococcus aureus (BCID) NOT DETECTED NOT DETECTED Final   Staphylococcus epidermidis DETECTED (A) NOT DETECTED  Final    Comment: Methicillin (oxacillin) resistant coagulase negative staphylococcus. Possible blood culture contaminant (unless isolated from more than one blood culture draw  or clinical case suggests pathogenicity). No antibiotic treatment is indicated for blood  culture contaminants. CRITICAL RESULT CALLED TO, READ BACK BY AND VERIFIED WITH:  C/ PHARMD M. BITONTI 08/27/22 2100 A. LAFRANCE    Staphylococcus lugdunensis NOT DETECTED NOT DETECTED Final   Streptococcus species NOT DETECTED NOT DETECTED Final   Streptococcus agalactiae NOT DETECTED NOT DETECTED Final   Streptococcus pneumoniae NOT DETECTED NOT DETECTED Final   Streptococcus pyogenes NOT DETECTED NOT DETECTED Final   A.calcoaceticus-baumannii NOT DETECTED NOT DETECTED Final   Bacteroides fragilis NOT DETECTED NOT DETECTED Final   Enterobacterales NOT DETECTED NOT DETECTED Final   Enterobacter cloacae complex NOT DETECTED NOT DETECTED Final   Escherichia coli NOT DETECTED NOT DETECTED Final   Klebsiella aerogenes NOT DETECTED NOT DETECTED Final   Klebsiella oxytoca NOT DETECTED NOT DETECTED Final   Klebsiella pneumoniae NOT DETECTED NOT DETECTED Final   Proteus species NOT DETECTED NOT DETECTED Final   Salmonella species NOT DETECTED NOT DETECTED Final   Serratia marcescens NOT DETECTED NOT DETECTED Final   Haemophilus influenzae NOT DETECTED NOT DETECTED Final   Neisseria meningitidis NOT DETECTED NOT DETECTED Final   Pseudomonas aeruginosa NOT DETECTED NOT DETECTED Final   Stenotrophomonas maltophilia NOT DETECTED NOT DETECTED Final   Candida albicans NOT DETECTED NOT DETECTED Final   Candida auris NOT DETECTED NOT DETECTED Final   Candida glabrata NOT DETECTED NOT DETECTED Final   Candida krusei NOT DETECTED NOT DETECTED Final   Candida parapsilosis NOT DETECTED NOT DETECTED Final   Candida tropicalis NOT DETECTED NOT DETECTED Final   Cryptococcus neoformans/gattii NOT DETECTED NOT DETECTED Final   Methicillin resistance mecA/C DETECTED (A) NOT DETECTED Final    Comment: CRITICAL RESULT CALLED TO, READ BACK BY AND VERIFIED WITH:  C/ PHARMD M. BITONTI 08/27/22 2100 A. LAFRANCE Performed at  [facility], [place] [address] [place], [place] [address]   Culture, blood (Routine X 2) w Reflex to ID Panel     Status: None (Preliminary result)   Collection Time: 08/26/22 11:55 PM   Specimen: BLOOD LEFT HAND  Result Value Ref Range Status   Specimen Description BLOOD LEFT HAND  Final   Special Requests   Final    BOTTLES DRAWN AEROBIC ONLY Blood Culture results may not be optimal due to an inadequate volume of blood received in culture bottles   Culture   Final    NO GROWTH 2 DAYS Performed at [facility], [place] [address] [place],  [address]    Report Status PENDING  Incomplete    Today   Subjective    Tiffany Black today has no headache,no chest abdominal pain,no new weakness tingling or numbness, feels much better wants to go home today.    Objective   Blood pressure (!) 153/91, pulse 78, temperature 98.2 F (36.8 C), temperature source Oral, resp. rate 20, weight 60.2 kg, SpO2 93 %.   Intake/Output Summary (Last 24 hours) at 08/29/2022 0856 Last data filed at 08/29/2022 0816 Gross per 24 hour  Intake --  Output 1800 ml  Net -1800 ml    Exam  Awake , pleasantly confused, No new F.N deficits,    .AT,PERRAL Supple Neck,   Symmetrical Chest wall movement, Good air movement bilaterally, CTAB RRR,No Gallops,   +ve B.Sounds, Abd Soft, Non tender,  No Cyanosis, Clubbing or  edema    Data Review   Recent Labs  Lab 08/26/22 2155 08/27/22 0411 08/28/22 0736 08/29/22 0440  WBC 4.6 3.9* 4.5 4.5  HGB 11.6* 11.1* 10.9* 10.9*  HCT 34.4* 33.8* 31.8* 31.6*  PLT 156 151 169 176  MCV 93.5 95.8 90.6 91.6  MCH 31.5 31.4 31.1 31.6  MCHC 33.7 32.8 34.3 34.5  RDW 13.4 13.5 13.5 13.5  LYMPHSABS 0.5* 0.5* 1.4 1.4  MONOABS 0.5 0.5 0.6 0.6  EOSABS 0.0 0.0 0.0 0.0  BASOSABS 0.0 0.0 0.0 0.0    Recent Labs  Lab 08/26/22 2155 08/26/22 2350 08/27/22 0411 08/27/22 2038 08/28/22 0736 08/29/22 0440  NA 133*  --  132*  --  134* 136  K 4.0  --  3.4*   --  3.7 3.7  CL 100  --  99  --  99 101  CO2 22  --  21*  --  26 25  ANIONGAP 11  --  12  --  9 10  GLUCOSE 140*  --  116*  --  91 83  BUN 13  --  10  --  18 24*  CREATININE 1.11*  --  0.89  --  1.04* 1.16*  AST  --   --  32  --  29 27  ALT  --   --  22  --  21 19  ALKPHOS  --   --  51  --  46 50  BILITOT  --   --  0.4  --  0.5 0.5  ALBUMIN  --   --  3.2*  --  2.8* 3.1*  CRP  --   --   --  0.6 <0.5 <0.5  PROCALCITON  --   --  <0.10  --  <0.10 <0.10  LATICACIDVEN  --  1.5  --   --   --   --   TSH  --   --  0.900  --   --   --   BNP  --   --  382.3* 415.7* 241.9* 233.8*  MG  --   --  1.5*  --  2.3 2.1  CALCIUM 8.6*  --  8.0*  --  8.3* 8.7*     Total Time in preparing paper work, data evaluation and todays exam - 35 minutes  Signature  -    Lala Lund M.D on 08/29/2022 at 8:56 AM   -  To page go to [email or web address]

## 2022-08-29 NOTE — Progress Notes (Signed)
Responded to consult for IV. Pt disoriented to place and time; attempted reorientation. Pt refuses IV even after education for reason. RN notified and attempted to talk to pt. Pt continues to refuse IV and states she wants to go home; states, "I'm not sick". Per RN, okay to hold on IV for now.

## 2022-08-29 NOTE — Care Management Important Message (Signed)
Important Message  Patient Details  Name: Tiffany Black MRN: [id number] Date of Birth: 12/02/1933   Medicare Important Message Given:  Yes  Tried to call the patient room to remind her of the Important  message from Medicare no answer will mail to the patient home address   Orbie Pyo 08/29/2022, 3:54 PM

## 2022-08-29 NOTE — NC FL2 (Signed)
Campus LEVEL OF CARE FORM     IDENTIFICATION  Patient Name: Tiffany Black Birthdate: [birth year] Sex: female Admission Date (Current Location): 08/26/2022  [facility] and [place] Number:  Herbalist and Address:  [facility], [place] [address] [place], [place] [address]      Provider Number: [id number]  Attending Physician Name and Address:  Thurnell Lose, MD  Relative Name and Phone Number:       Current Level of Care: Hospital Recommended Level of Care: [place] Prior Approval Number:    Date Approved/Denied:   PASRR Number: [id number]  Discharge Plan: SNF    Current Diagnoses: Patient Active Problem List   Diagnosis Date Noted   COVID-19 virus infection 08/27/2022   Acute respiratory failure with hypoxia ([place]) 08/27/2022   Generalized weakness 08/27/2022   Fall at home, initial encounter 08/27/2022   Acute hyponatremia 08/27/2022   Stage 3a chronic kidney disease (CKD) ([place]) 08/27/2022   Anemia of chronic disease 08/27/2022   Acute encephalopathy 08/10/2022   GAD (generalized anxiety disorder) 08/10/2022   TIA (transient ischemic attack) 08/10/2022   Aphasia 08/09/2022   Atrial fibrillation with RVR ([place]) 09/07/2021   Headache syndrome 07/02/2017   Chest pain 05/23/2015   Essential hypertension    History of stroke 05/10/2015   Dizziness 05/10/2015   Orthostatic hypotension 05/10/2015   CVA (cerebral infarction) 05/10/2015   Hypokalemia 09/30/2013   Vertigo 02/28/2013   Sinusitis 02/28/2013   Anxiety 02/28/2013   Paroxysmal atrial fibrillation with RVR ([place]) 02/28/2013   PAC (premature atrial contraction) 04/10/2012   Abnormal EKG 01/19/2012   Persistent atrial fibrillation ([place]) 02/15/2010    Orientation RESPIRATION BLADDER Height & Weight     Self  Normal Incontinent Weight: 132 lb 11.5 oz (60.2 kg) Height:     BEHAVIORAL SYMPTOMS/MOOD NEUROLOGICAL BOWEL NUTRITION STATUS       Continent Diet (See DC Summary)  AMBULATORY STATUS COMMUNICATION OF NEEDS Skin     Verbally Normal                       Personal Care Assistance Level of Assistance  Bathing, Feeding, Dressing           Functional Limitations Info             SPECIAL CARE FACTORS FREQUENCY  PT (By licensed PT), OT (By licensed OT)     PT Frequency: 5x/week OT Frequency: 5x/week            Contractures Contractures Info: Not present    Additional Factors Info  Code Status, Allergies, Isolation Precautions, Psychotropic Code Status Info: Full Code Allergies Info: Hydrochlorothiazide, Penicillins, Atorvastatin, Ciprofloxacin, Doxycycline, Hydrocodone-acetaminophen, Iodinated Contrast Media, Iodine-131, Latex, Metoprolol, Statins, Carbamazepine, Codeine, Nickel, Sulfa Antibiotics, Sulfamethoxazole, Sulfonamide Derivatives Psychotropic Info: Seroquel   Isolation Precautions Info: COVID from prior admission     Current Medications (08/29/2022):  This is the current hospital active medication list Current Facility-Administered Medications  Medication Dose Route Frequency Provider Last Rate Last Admin   acetaminophen (TYLENOL) tablet 650 mg  650 mg Oral Q6H PRN Howerter, Justin B, DO   650 mg at 08/28/22 1750   Or   acetaminophen (TYLENOL) suppository 650 mg  650 mg Rectal Q6H PRN Howerter, Justin B, DO       amLODipine (NORVASC) tablet 5 mg  5 mg Oral Daily Thurnell Lose, MD   5 mg at 08/29/22 0911   azithromycin (ZITHROMAX)  tablet 250 mg  250 mg Oral Daily Thurnell Lose, MD   250 mg at 08/29/22 0911   ezetimibe (ZETIA) tablet 10 mg  10 mg Oral Daily Howerter, Justin B, DO   10 mg at 08/29/22 0911   famotidine (PEPCID) tablet 20 mg  20 mg Oral Daily Thurnell Lose, MD   20 mg at 08/29/22 0912   hydrALAZINE (APRESOLINE) injection 10 mg  10 mg Intravenous Q6H PRN Thurnell Lose, MD   10 mg at 08/27/22 2143   levalbuterol (XOPENEX) nebulizer solution 1.25 mg  1.25 mg  Nebulization Q4H PRN Howerter, Justin B, DO       melatonin tablet 3 mg  3 mg Oral QHS PRN Howerter, Justin B, DO   3 mg at 08/27/22 2143   methylPREDNISolone sodium succinate (SOLU-MEDROL) 40 mg/mL injection 20 mg  20 mg Intravenous Daily Thurnell Lose, MD       metoprolol tartrate (LOPRESSOR) injection 5 mg  5 mg Intravenous Q2H PRN Howerter, Justin B, DO       metoprolol tartrate (LOPRESSOR) tablet 100 mg  100 mg Oral BID Thurnell Lose, MD   100 mg at 08/29/22 0911   QUEtiapine (SEROQUEL) tablet 50 mg  50 mg Oral QHS Howerter, Justin B, DO   50 mg at 08/28/22 2144   Rivaroxaban (XARELTO) tablet 15 mg  15 mg Oral Q supper Howerter, Justin B, DO   15 mg at 08/28/22 1752     Discharge Medications: Please see discharge summary for a list of discharge medications.  Relevant Imaging Results:  Relevant Lab Results:   Additional Information SSN [place]

## 2022-08-29 NOTE — Discharge Instructions (Signed)
Follow with Primary MD Ginger Organ., MD in 7 days   Get CBC, CMP, 2 view Chest X ray -  checked next visit with your primary MD or SNF MD   Activity: As tolerated with Full fall precautions use walker/cane & assistance as needed  Disposition SNF  Diet: Heart Healthy with feeding assistance and aspiration precautions.  Special Instructions: If you have smoked or chewed Tobacco  in the last 2 yrs please stop smoking, stop any regular Alcohol  and or any Recreational drug use.  On your next visit with your primary care physician please Get Medicines reviewed and adjusted.  Please request your Prim.MD to go over all Hospital Tests and Procedure/Radiological results at the follow up, please get all Hospital records sent to your Prim MD by signing hospital release before you go home.  If you experience worsening of your admission symptoms, develop shortness of breath, life threatening emergency, suicidal or homicidal thoughts you must seek medical attention immediately by calling 911 or calling your MD immediately  if symptoms less severe.  You Must read complete instructions/literature along with all the possible adverse reactions/side effects for all the Medicines you take and that have been prescribed to you. Take any new Medicines after you have completely understood and accpet all the possible adverse reactions/side effects.

## 2022-08-30 DIAGNOSIS — U071 COVID-19: Secondary | ICD-10-CM | POA: Diagnosis not present

## 2022-08-30 LAB — C-REACTIVE PROTEIN: CRP: <0.5 mg/dL (ref <1.0)

## 2022-08-30 LAB — COMPREHENSIVE METABOLIC PANEL
ALT: 21 U/L (ref 0–44)
AST: 27 U/L (ref 15–41)
Albumin: 3 g/dL — ABNORMAL LOW (ref 3.5–5.0)
Alkaline Phosphatase: 48 U/L (ref 38–126)
Anion gap: 9 (ref 5–15)
BUN: 29 mg/dL — ABNORMAL HIGH (ref 8–23)
CO2: 26 mmol/L (ref 22–32)
Calcium: 8.6 mg/dL — ABNORMAL LOW (ref 8.9–10.3)
Chloride: 100 mmol/L (ref 98–111)
Creatinine, Ser: 1.32 mg/dL — ABNORMAL HIGH (ref 0.44–1.00)
GFR, Estimated: 39 mL/min — ABNORMAL LOW (ref >60)
Glucose, Bld: 79 mg/dL (ref 70–99)
Potassium: 3.7 mmol/L (ref 3.5–5.1)
Sodium: 135 mmol/L (ref 135–145)
Total Bilirubin: 0.5 mg/dL (ref 0.3–1.2)
Total Protein: 5.3 g/dL — ABNORMAL LOW (ref 6.5–8.1)

## 2022-08-30 LAB — CBC WITH DIFFERENTIAL/PLATELET
Abs Immature Granulocytes: 0 10*3/uL (ref 0.00–0.07)
Basophils Absolute: 0 10*3/uL (ref 0.0–0.1)
Basophils Relative: 0 %
Eosinophils Absolute: 0 10*3/uL (ref 0.0–0.5)
Eosinophils Relative: 0 %
HCT: 33 % — ABNORMAL LOW (ref 36.0–46.0)
Hemoglobin: 11 g/dL — ABNORMAL LOW (ref 12.0–15.0)
Immature Granulocytes: 0 %
Lymphocytes Relative: 34 %
Lymphs Abs: 1.7 10*3/uL (ref 0.7–4.0)
MCH: 31.3 pg (ref 26.0–34.0)
MCHC: 33.3 g/dL (ref 30.0–36.0)
MCV: 93.8 fL (ref 80.0–100.0)
Monocytes Absolute: 0.6 10*3/uL (ref 0.1–1.0)
Monocytes Relative: 11 %
Neutro Abs: 2.6 10*3/uL (ref 1.7–7.7)
Neutrophils Relative %: 55 %
Platelets: 170 10*3/uL (ref 150–400)
RBC: 3.52 MIL/uL — ABNORMAL LOW (ref 3.87–5.11)
RDW: 13.5 % (ref 11.5–15.5)
WBC: 4.9 10*3/uL (ref 4.0–10.5)
nRBC: 0 % (ref 0.0–0.2)

## 2022-08-30 LAB — BRAIN NATRIURETIC PEPTIDE: B Natriuretic Peptide: 160.1 pg/mL — ABNORMAL HIGH (ref 0.0–100.0)

## 2022-08-30 LAB — MAGNESIUM: Magnesium: 2 mg/dL (ref 1.7–2.4)

## 2022-08-30 LAB — PROCALCITONIN: Procalcitonin: <0.1 ng/mL

## 2022-08-30 MED ORDER — AMLODIPINE BESYLATE 10 MG PO TABS: 10.0000 mg | ORAL_TABLET | Freq: Every day | ORAL | Status: DC

## 2022-08-30 MED ORDER — AMLODIPINE BESYLATE 5 MG PO TABS: 5.0000 mg | ORAL_TABLET | Freq: Once | ORAL | Status: AC

## 2022-08-30 NOTE — TOC Progression Note (Signed)
Transition of Care [facility]) - Progression Note    Patient Details  Name: Tiffany Black MRN: [id number] Date of Birth: Jan 28, 1934  Transition of Care [facility]) CM/SW [place], LCSW Phone Number: 08/30/2022, 9:29 AM  Clinical Narrative:    9:29am-Awaiting confirmation of bed availability from [facility].   Expected Discharge Plan: Skilled Nursing Facility Barriers to Discharge: Insurance Authorization  Expected Discharge Plan and Services Expected Discharge Plan: [place] In-house Referral: Clinical Social Work   Post Acute Care Choice: [place] Living arrangements for the past 2 months: [place], [place] Expected Discharge Date: 08/29/22                                     Social Determinants of Health (SDOH) Interventions    Readmission Risk Interventions    08/29/2022   10:19 AM  Readmission Risk Prevention Plan  Transportation Screening Complete  Medication Review (RN Care Manager) Complete  PCP or Specialist appointment within 3-5 days of discharge Complete  HRI or Home Care Consult Complete  SW Recovery Care/Counseling Consult Complete  Palliative Care Screening Not [place] Complete

## 2022-08-30 NOTE — Progress Notes (Signed)
[facility]                                                                                                                                                                         Patient Demographics  Tiffany Black, is a 88 y.o. female  LPF:[id number]  ZHG:[id number]  DOB - Feb 26, 1934  Admit date - 08/26/2022  Admitting Physician Tiffany Mura, DO  Outpatient Primary MD for the patient is Tiffany Black., MD  LOS - 3   Chief Complaint  Patient presents with   Shortness of Breath   Tachycardia        Assessment & Plan    Patient seen briefly today due for discharge soon per Discharge done yesterday by me, no further issues, Vital signs stable, patient feels fine.      Medications  Scheduled Meds:  [START ON 08/31/2022] amLODipine  10 mg Oral Daily   amLODipine  5 mg Oral Once   ezetimibe  10 mg Oral Daily   famotidine  20 mg Oral Daily   haloperidol lactate  2 mg Intramuscular Once   metoprolol tartrate  100 mg Oral BID   QUEtiapine  50 mg Oral QHS   Rivaroxaban  15 mg Oral Q supper   Continuous Infusions: PRN Meds:.acetaminophen **OR** acetaminophen, hydrALAZINE, levalbuterol, melatonin, metoprolol tartrate    Time Spent in minutes   10 minutes   Tiffany Black M.D on 08/30/2022 at 9:41 AM  Between 7am to 7pm - Pager - [phone]  After 7pm go to [email or web address] - password [email or web address]  And look for the night coverage person covering for me after hours  [facility] Office  [phone]    Subjective:   Tiffany Black today has, No headache, No chest pain, No abdominal pain - No Nausea, No new weakness tingling or numbness, No Cough - SOB.    Blood pressure 155/80, pulse 85, temperature 97.6 F (36.4 C), temperature source Oral, resp. rate 19, weight 60.2 kg, SpO2 97 %.   No intake or output data in the 24 hours ending 08/30/22 0941  Exam  Awake,  pleasantly confused, No new F.N deficits, Normal affect [place].AT,PERRAL Supple Neck, No JVD,   Symmetrical Chest wall movement, Good air movement bilaterally, CTAB RRR,No Gallops, Rubs or new Murmurs,  +ve B.Sounds, Abd Soft, No tenderness,   No Cyanosis, Clubbing or edema   Data Review

## 2022-08-30 NOTE — TOC Transition Note (Signed)
Transition of Care [facility]) - CM/SW Discharge Note   Patient Details  Name: Tiffany Black MRN: [id number] Date of Birth: October 19, 1933  [facility] CM/SW Contact:  Benard Halsted, LCSW Phone Number: 08/30/2022, 12:17 PM   Clinical Narrative:    Patient will DC to: [place] Anticipated DC date: 08/30/22 Family notified: Son, Gerald Stabs Transport by: Corey Harold   Per MD patient ready for DC to [facility]. RN to call report prior to discharge [phone] room [phone]). RN, patient, patient's family, and facility notified of DC. Discharge Summary and FL2 sent to facility. DC packet on chart. Ambulance transport requested for patient.   CSW will sign off for now as social work intervention is no longer needed. Please consult [place] again if new needs arise.     Final next level of care: Skilled Nursing Facility Barriers to Discharge: Barriers Resolved   Patient Goals and CMS Choice Patient states their goals for this hospitalization and ongoing recovery are:: Return to SNF CMS Medicare.gov Compare Post Acute Care list provided to:: Patient Represenative (must comment) Choice offered to / list presented to : Adult Children    Discharge Placement   Existing PASRR number confirmed : 08/30/22          Patient chooses bed at: [facility] Patient to be transferred to facility by: [place] Name of family member notified: Son Gerald Stabs Patient and family notified of of transfer: 08/30/22  Discharge Plan and Services In-house Referral: Clinical Social Work   Post Acute Care Choice: [place]                               Social Determinants of Health (SDOH) Interventions     Readmission Risk Interventions    08/29/2022   10:19 AM  Readmission Risk Prevention Plan  Transportation Screening Complete  Medication Review Press photographer) Complete  PCP or Specialist appointment within 3-5 days of discharge Complete  HRI or Home Care Consult Complete  SW  Recovery Care/Counseling Consult Complete  Palliative Care Screening Not [place] Complete

## 2022-09-01 LAB — CULTURE, BLOOD (ROUTINE X 2): Culture: NO GROWTH

## 2022-09-25 ENCOUNTER — Telehealth: Payer: Self-pay | Admitting: Internal Medicine

## 2022-09-25 NOTE — Telephone Encounter (Signed)
Patient c/o Palpitations:  High priority if patient c/o lightheadedness, shortness of breath, or chest pain  How long have you had palpitations/irregular HR/ Afib? Are you having the symptoms now?  Patient's son states the patient is always in afib, but today the paramedics went to her house due rapid HR and they are still there. He states he isn't there yet, but he is on his way home so a call back to their home number is fine  Are you currently experiencing lightheadedness, SOB or CP?  Not currently with patient  Do you have a history of afib (atrial fibrillation) or irregular heart rhythm?  Yes   Have you checked your BP or HR? (document readings if available):  HR: Ranging 80-120 sitting, around 155 standing   Are you experiencing any other symptoms?  Dizziness, slight fever last night

## 2022-09-25 NOTE — Telephone Encounter (Signed)
Spoke with pt who states she was evaluated earlier in the afternoon by EMS who recommended pt have follow up with cardiology.  Pt denies current CP, SOB or dizziness.  She states she is having no issues currently and will be going to bed soon.  She does not know her current heart rate.  She states she is taking medications as prescribed.  Reviewed ED precautions.  Will forward to Dr Olin Pia scheduler for appt.  Pt verbalizes understanding and thanked Therapist, sports for the call.

## 2022-09-26 ENCOUNTER — Other Ambulatory Visit: Payer: Self-pay

## 2022-09-26 ENCOUNTER — Emergency Department (HOSPITAL_COMMUNITY)
Admission: EM | Admit: 2022-09-26 | Discharge: 2022-09-27 | Disposition: A | Discharge status: Home / Self Care | Payer: Medicare Other | Source: Home / Self Care | Attending: Emergency Medicine | Admitting: Emergency Medicine

## 2022-09-26 ENCOUNTER — Encounter (HOSPITAL_COMMUNITY): Payer: Self-pay

## 2022-09-26 ENCOUNTER — Emergency Department (HOSPITAL_COMMUNITY): Payer: Medicare Other

## 2022-09-26 DIAGNOSIS — D631 Anemia in chronic kidney disease: Secondary | ICD-10-CM | POA: Insufficient documentation

## 2022-09-26 DIAGNOSIS — Z91040 Latex allergy status: Secondary | ICD-10-CM | POA: Insufficient documentation

## 2022-09-26 DIAGNOSIS — J918 Pleural effusion in other conditions classified elsewhere: Secondary | ICD-10-CM | POA: Insufficient documentation

## 2022-09-26 DIAGNOSIS — I4891 Unspecified atrial fibrillation: Secondary | ICD-10-CM | POA: Insufficient documentation

## 2022-09-26 DIAGNOSIS — I10 Essential (primary) hypertension: Secondary | ICD-10-CM

## 2022-09-26 DIAGNOSIS — J189 Pneumonia, unspecified organism: Secondary | ICD-10-CM | POA: Diagnosis not present

## 2022-09-26 DIAGNOSIS — Z8616 Personal history of COVID-19: Secondary | ICD-10-CM | POA: Insufficient documentation

## 2022-09-26 DIAGNOSIS — J90 Pleural effusion, not elsewhere classified: Secondary | ICD-10-CM

## 2022-09-26 DIAGNOSIS — N183 Chronic kidney disease, stage 3 unspecified: Secondary | ICD-10-CM | POA: Insufficient documentation

## 2022-09-26 DIAGNOSIS — F03918 Unspecified dementia, unspecified severity, with other behavioral disturbance: Secondary | ICD-10-CM | POA: Insufficient documentation

## 2022-09-26 DIAGNOSIS — Z7901 Long term (current) use of anticoagulants: Secondary | ICD-10-CM | POA: Insufficient documentation

## 2022-09-26 DIAGNOSIS — J181 Lobar pneumonia, unspecified organism: Secondary | ICD-10-CM | POA: Insufficient documentation

## 2022-09-26 DIAGNOSIS — Z79899 Other long term (current) drug therapy: Secondary | ICD-10-CM | POA: Insufficient documentation

## 2022-09-26 DIAGNOSIS — I129 Hypertensive chronic kidney disease with stage 1 through stage 4 chronic kidney disease, or unspecified chronic kidney disease: Secondary | ICD-10-CM | POA: Insufficient documentation

## 2022-09-26 DIAGNOSIS — J942 Hemothorax: Secondary | ICD-10-CM | POA: Diagnosis not present

## 2022-09-26 LAB — CBC WITH DIFFERENTIAL/PLATELET
Abs Immature Granulocytes: 0.01 10*3/uL (ref 0.00–0.07)
Basophils Absolute: 0 10*3/uL (ref 0.0–0.1)
Basophils Relative: 1 %
Eosinophils Absolute: 0.1 10*3/uL (ref 0.0–0.5)
Eosinophils Relative: 2 %
HCT: 37.8 % (ref 36.0–46.0)
Hemoglobin: 12.2 g/dL (ref 12.0–15.0)
Immature Granulocytes: 0 %
Lymphocytes Relative: 23 %
Lymphs Abs: 1.2 10*3/uL (ref 0.7–4.0)
MCH: 32 pg (ref 26.0–34.0)
MCHC: 32.3 g/dL (ref 30.0–36.0)
MCV: 99.2 fL (ref 80.0–100.0)
Monocytes Absolute: 0.6 10*3/uL (ref 0.1–1.0)
Monocytes Relative: 11 %
Neutro Abs: 3.3 10*3/uL (ref 1.7–7.7)
Neutrophils Relative %: 63 %
Platelets: 168 10*3/uL (ref 150–400)
RBC: 3.81 MIL/uL — ABNORMAL LOW (ref 3.87–5.11)
RDW: 14.9 % (ref 11.5–15.5)
WBC: 5.2 10*3/uL (ref 4.0–10.5)
nRBC: 0 % (ref 0.0–0.2)

## 2022-09-26 LAB — COMPREHENSIVE METABOLIC PANEL
ALT: UNDETERMINED U/L (ref 0–44)
ALT: 10 U/L (ref 0–44)
AST: 25 U/L (ref 15–41)
AST: 25 U/L (ref 15–41)
Albumin: 3.2 g/dL — ABNORMAL LOW (ref 3.5–5.0)
Albumin: 2.5 g/dL — ABNORMAL LOW (ref 3.5–5.0)
Alkaline Phosphatase: 69 U/L (ref 38–126)
Alkaline Phosphatase: 54 U/L (ref 38–126)
Anion gap: 17 — ABNORMAL HIGH (ref 5–15)
Anion gap: 11 (ref 5–15)
BUN: 11 mg/dL (ref 8–23)
BUN: 9 mg/dL (ref 8–23)
CO2: 17 mmol/L — ABNORMAL LOW (ref 22–32)
CO2: 15 mmol/L — ABNORMAL LOW (ref 22–32)
Calcium: 8.9 mg/dL (ref 8.9–10.3)
Calcium: 7 mg/dL — ABNORMAL LOW (ref 8.9–10.3)
Chloride: 103 mmol/L (ref 98–111)
Chloride: 113 mmol/L — ABNORMAL HIGH (ref 98–111)
Creatinine, Ser: 1 mg/dL (ref 0.44–1.00)
Creatinine, Ser: 0.82 mg/dL (ref 0.44–1.00)
GFR, Estimated: 55 mL/min — ABNORMAL LOW (ref >60)
GFR, Estimated: >60 mL/min (ref >60)
Glucose, Bld: 88 mg/dL (ref 70–99)
Glucose, Bld: 89 mg/dL (ref 70–99)
Potassium: 3.9 mmol/L (ref 3.5–5.1)
Potassium: 3.4 mmol/L — ABNORMAL LOW (ref 3.5–5.1)
Sodium: 137 mmol/L (ref 135–145)
Sodium: 139 mmol/L (ref 135–145)
Total Bilirubin: UNDETERMINED mg/dL (ref 0.3–1.2)
Total Bilirubin: 1 mg/dL (ref 0.3–1.2)
Total Protein: 5.6 g/dL — ABNORMAL LOW (ref 6.5–8.1)
Total Protein: 4.2 g/dL — ABNORMAL LOW (ref 6.5–8.1)

## 2022-09-26 LAB — TROPONIN I (HIGH SENSITIVITY)
Troponin I (High Sensitivity): 7 ng/L (ref <18)
Troponin I (High Sensitivity): 6 ng/L (ref <18)

## 2022-09-26 LAB — LACTIC ACID, PLASMA
Lactic Acid, Venous: 1.8 mmol/L (ref 0.5–1.9)
Lactic Acid, Venous: 1.2 mmol/L (ref 0.5–1.9)

## 2022-09-26 LAB — BRAIN NATRIURETIC PEPTIDE: B Natriuretic Peptide: 204 pg/mL — ABNORMAL HIGH (ref 0.0–100.0)

## 2022-09-26 MED ORDER — HALOPERIDOL LACTATE 5 MG/ML IJ SOLN
2.0000 mg | Freq: Once | INTRAMUSCULAR | Status: AC
  Administered 2022-09-26: 2 mg via INTRAVENOUS
  Filled 2022-09-26: qty 1

## 2022-09-26 MED ORDER — AMLODIPINE BESYLATE 5 MG PO TABS
5.0000 mg | ORAL_TABLET | Freq: Once | ORAL | Status: AC
  Administered 2022-09-26: 5 mg via ORAL
  Filled 2022-09-26: qty 1

## 2022-09-26 MED ORDER — RIVAROXABAN 15 MG PO TABS
15.0000 mg | ORAL_TABLET | Freq: Every day | ORAL | Status: DC
  Administered 2022-09-26: 15 mg via ORAL
  Filled 2022-09-26: qty 1

## 2022-09-26 MED ORDER — SODIUM CHLORIDE 0.9 % IV SOLN
500.0000 mg | Freq: Once | INTRAVENOUS | Status: AC
  Administered 2022-09-26: 500 mg via INTRAVENOUS
  Filled 2022-09-26: qty 5

## 2022-09-26 MED ORDER — SODIUM CHLORIDE 0.9 % IV SOLN
1.0000 g | Freq: Once | INTRAVENOUS | Status: AC
  Administered 2022-09-26: 1 g via INTRAVENOUS
  Filled 2022-09-26: qty 10

## 2022-09-26 MED ORDER — METOPROLOL TARTRATE 25 MG PO TABS
50.0000 mg | ORAL_TABLET | Freq: Once | ORAL | Status: AC
  Administered 2022-09-26: 50 mg via ORAL
  Filled 2022-09-26: qty 2

## 2022-09-26 MED ORDER — LORAZEPAM 1 MG PO TABS
0.5000 mg | ORAL_TABLET | Freq: Once | ORAL | Status: AC
  Administered 2022-09-26: 0.5 mg via ORAL
  Filled 2022-09-26: qty 1

## 2022-09-26 NOTE — ED Triage Notes (Signed)
Pt arrived via [facility] for dizziness and shortness of breath. Pt has dementia at baseline and does not have a caregiver during the day. Upon EMS arrival pt could not provide further information on symptoms, denying dizziness and shortness of breath. Pt in afib RVR at initial assessment 140-170. Pt is non-compliant/does not take medication regularly. 100 ml NS administered via 20g RW.  BP 150/60 HR 110-130 afib SPO2 98% RR 18 CBG 124

## 2022-09-26 NOTE — ED Notes (Signed)
This RN went into patient's room and attempted to collect ordered blood work. Patient refusing blood work at this time and stating "I will not give you anymore of my blood, this is my son's fault and I don't want to be here I want to go home." Provider made aware of same.

## 2022-09-26 NOTE — ED Notes (Signed)
Tiffany Black Son called asking for registering [phone]

## 2022-09-26 NOTE — ED Provider Notes (Signed)
[facility] EMERGENCY DEPARTMENT Provider Note   CSN: [id number] Arrival date & time: 09/26/22  1516     History  Chief Complaint  Patient presents with   Shortness of Breath   Dizziness    Tiffany Black is a 89 y.o. female.  HPI     89 year old female with a history of paroxysmal atrial fibrillation on Xarelto, dementia with baseline confusion, hypertension, stage III CKD, anemia of chronic disease with baseline hemoglobin around 12, admission in December for metabolic encephalopathy and hypoxia secondary to COVID-19 who presents with concern for fatigue and shortness of breath.   She apparently has dementia at baseline but does not not have a caregiver during the day.  She was in atrial fibrillation with RVR on EMS arrival with rates 140-170, was given 100 cc of normal saline. History limited as she does not remember why she called EMS or why she was here..   Aware of location, January not year.   Doesn't remember why she called, didn't fall "guess wasn't feeling well" .Son works at [employer] per patient and he said he called.   Reports might have been short of breath. Denies dyspnea or chest pain now.   Reported to son she was feeling really weak, lethargic, HR was spiking up to 155   Was in facility at the end of November because she was falling. Has been home 10 days, lives with son. Had some caregivers but ran them off.    No abdominal pain, chest pain, cough, temp 99. No nausea, vomiting, diarrhea.   Just fatigue, "lethargic" going from room to room. No energy.   Home Medications Prior to Admission medications   Medication Sig Start Date End Date Taking? Authorizing Provider  amLODipine (NORVASC) 5 MG tablet Take 1 tablet (5 mg total) by mouth daily. 09/27/22 10/27/22 Yes Gareth Morgan, MD  azithromycin (ZITHROMAX) 250 MG tablet Take 1 tablet (250 mg total) by mouth daily. Take first 2 tablets together, then 1 every day until finished. 09/27/22   Yes Gareth Morgan, MD  cephALEXin (KEFLEX) 500 MG capsule Take 1 capsule (500 mg total) by mouth 3 (three) times daily for 7 days. 09/27/22 10/04/22 Yes Gareth Morgan, MD  acetaminophen (TYLENOL) 325 MG tablet Take 2 tablets (650 mg total) by mouth every 6 (six) hours as needed for mild pain (or Fever >/= 101). 08/15/22   Elgergawy, Silver Huguenin, MD  DIOVAN 160 MG tablet Take 80 mg by mouth 2 (two) times daily. 02/16/21   [provider]  ezetimibe (ZETIA) 10 MG tablet Take 1 tablet (10 mg total) by mouth daily. 08/16/22   Elgergawy, Silver Huguenin, MD  felodipine (PLENDIL) 10 MG 24 hr tablet Take 1 tablet (10 mg total) by mouth daily. 08/16/22   Elgergawy, Silver Huguenin, MD  FLUoxetine (PROZAC) 10 MG capsule Take 10 mg by mouth daily.    [provider]  KLOR-CON M20 20 MEQ tablet Take 10 mEq by mouth daily.    [provider]  Meclizine HCl 25 MG CHEW Chew 25 mg by mouth every 8 (eight) hours as needed (for dizziness). 06/03/21   [provider]  metoprolol tartrate (LOPRESSOR) 50 MG tablet Take 1 tablet (50 mg total) by mouth 2 (two) times daily. 08/15/22   Elgergawy, Silver Huguenin, MD  QUEtiapine (SEROQUEL) 50 MG tablet Take 1 tablet (50 mg total) by mouth at bedtime. 08/15/22   Elgergawy, Silver Huguenin, MD  Rivaroxaban (XARELTO) 15 MG TABS tablet Take 1 tablet (15  mg total) by mouth daily with supper. 03/06/22   Deboraha Sprang, MD  TYLENOL PM EXTRA STRENGTH 500-25 MG TABS tablet Take 2 tablets by mouth at bedtime.    [provider]      Allergies    Hydrochlorothiazide, Penicillins, Atorvastatin, Ciprofloxacin, Doxycycline, Hydrocodone-acetaminophen, Iodinated contrast media, Iodine-131, Latex, Metoprolol, Statins, Carbamazepine, Codeine, Nickel, Sulfa antibiotics, Sulfamethoxazole, and Sulfonamide derivatives    Review of Systems   Review of Systems  Physical Exam Updated Vital Signs BP (!) 150/97   Pulse 79   Temp 98.3 F (36.8 C) (Oral)   Resp 20   Ht '5\' 8"'$  (1.727  m)   Wt 60 kg   SpO2 93%   BMI 20.11 kg/m  Physical Exam Vitals and nursing note reviewed.  Constitutional:      General: She is not in acute distress.    Appearance: She is well-developed. She is not diaphoretic.  HENT:     Head: Normocephalic and atraumatic.  Eyes:     Conjunctiva/sclera: Conjunctivae normal.  Cardiovascular:     Rate and Rhythm: Normal rate and regular rhythm.     Heart sounds: Normal heart sounds. No murmur heard.    No friction rub. No gallop.  Pulmonary:     Effort: Pulmonary effort is normal. No respiratory distress.     Breath sounds: No wheezing or rales.  Abdominal:     General: There is no distension.     Palpations: Abdomen is soft.     Tenderness: There is no abdominal tenderness. There is no guarding.  Musculoskeletal:        General: No tenderness.     Cervical back: Normal range of motion.  Skin:    General: Skin is warm and dry.     Findings: No erythema or rash.  Neurological:     Mental Status: She is alert and oriented to person, place, and time.     ED Results / Procedures / Treatments   Labs (all labs ordered are listed, but only abnormal results are displayed) Labs Reviewed  CBC WITH DIFFERENTIAL/PLATELET - Abnormal; Notable for the following components:      Result Value   RBC 3.81 (*)    All other components within normal limits  COMPREHENSIVE METABOLIC PANEL - Abnormal; Notable for the following components:   CO2 17 (*)    Total Protein 5.6 (*)    Albumin 3.2 (*)    GFR, Estimated 55 (*)    Anion gap 17 (*)    All other components within normal limits  BRAIN NATRIURETIC PEPTIDE - Abnormal; Notable for the following components:   B Natriuretic Peptide 204.0 (*)    All other components within normal limits  COMPREHENSIVE METABOLIC PANEL - Abnormal; Notable for the following components:   Potassium 3.4 (*)    Chloride 113 (*)    CO2 15 (*)    Calcium 7.0 (*)    Total Protein 4.2 (*)    Albumin 2.5 (*)    All other  components within normal limits  CULTURE, BLOOD (ROUTINE X 2)  CULTURE, BLOOD (ROUTINE X 2)  LACTIC ACID, PLASMA  LACTIC ACID, PLASMA  TROPONIN I (HIGH SENSITIVITY)  TROPONIN I (HIGH SENSITIVITY)    EKG EKG Interpretation  Date/Time:  Tuesday September 26 2022 15:24:26 EST Ventricular Rate:  99 PR Interval:    QRS Duration: 92 QT Interval:  315 QTC Calculation: 405 R Axis:   -50 Text Interpretation: Atrial fibrillation Left anterior fascicular block Abnormal  R-wave progression, late transition Probable LVH with secondary repol abnrm No significant change since last tracing Confirmed by Gareth Morgan [id number]) on 09/26/2022 3:36:32 PM  Radiology DG Chest Portable 1 View  Result Date: 09/26/2022 CLINICAL DATA:  Dizziness and shortness of breath EXAM: PORTABLE CHEST 1 VIEW COMPARISON:  08/26/2022 FINDINGS: Developing moderate right pleural effusion with adjacent opacity. Hyperinflation. Chronic interstitial changes. No consolidation in the left lung. No pneumothorax. Enlarged cardiopericardial silhouette with calcified aorta. Overlapping cardiac leads. The left inferior costophrenic angle is clipped at the edge of the film. IMPRESSION: Developing moderate right pleural effusion with adjacent opacity. Recommend follow-up Electronically Signed   By: Jill Side M.D.   On: 09/26/2022 16:09    Procedures Procedures    Medications Ordered in ED Medications  haloperidol lactate (HALDOL) injection 2 mg (2 mg Intravenous Given 09/26/22 1752)  cefTRIAXone (ROCEPHIN) 1 g in sodium chloride 0.9 % 100 mL IVPB (0 g Intravenous Stopped 09/26/22 1906)  azithromycin (ZITHROMAX) 500 mg in sodium chloride 0.9 % 250 mL IVPB (0 mg Intravenous Stopped 09/26/22 1952)  metoprolol tartrate (LOPRESSOR) tablet 50 mg (50 mg Oral Given 09/26/22 1844)  amLODipine (NORVASC) tablet 5 mg (5 mg Oral Given 09/26/22 2134)  LORazepam (ATIVAN) tablet 0.5 mg (0.5 mg Oral Given 09/26/22 2133)    ED Course/ Medical Decision  Making/ A&P                              89 year old female with a history of paroxysmal atrial fibrillation on Xarelto, dementia with baseline confusion, hypertension, stage III CKD, anemia of chronic disease with baseline hemoglobin around 12, admission in December for metabolic encephalopathy and hypoxia secondary to COVID-19 who presents with concern for fatigue and shortness of breath.  She called EMS yesterday and today but at this time does not recall why she called and reports feeling better.  Differential diagnosis for dyspnea includes ACS, PE, COPD exacerbation, CHF exacerbation, anemia, pneumonia, viral etiology such as COVID 19 infection, metabolic abnormality, cardiac arrhythmia.    Chest x-ray was done which showed right pleural effusion and possible adjacent opacity.   EKG was evaluated by me which showed atrial fibrillation.    BNP was 204, less than most recent admission values, do not suspect significant CHF by history or exam.  Denies CP, troponin WNL x2, doubt ACS.  Doubt PE in setting of taking xarelto.    Labs completed and personally evaluated by me initially show bicarb 17, AG 17. Lactic acid WNL, not uremic, son denies possibility of toxic ingestion, aspirin use, iron tablet use, glucose WNL and doubt DKA.  Possible mild starvation ketosis if not eating well. Anion gap improved on repeat, does ave mild nonanion gap hyperchloremic metabolic acidosis on recheck, possibly related to saline.    During long period of observation in the ED she remains hemodynamically stable without development of afib with RVR, no hypoxia.  Suspect she may have been having episodes of RVR at home per EMS discussion, discussed recommendation for adherence to her medications and Cardiology follow up.  She did have dementia related behaviors, angry she is in ED, getting up out of bed and taking off gown and was given dose of haldol '2mg'$  IV and ativan .'5mg'$  po for anxiety.  She does have elevated  blood pressures and gave her dose of amlodipine and will rx the same.   For her XR showing possible opacity and pleural effusion--gave referral to  pulmonology.  Given dose of rocephin/azithromycin in the ED for possible pneumonia. No leukocytosis, normal lactic acid, no fever and no signs of sepsis.  Will treat with keflex/azithromycin.    Discussed reasons to return, recommend PCP, cardiology and pulmonology follow up.  Patient discharged in stable condition with understanding of reasons to return.         Final Clinical Impression(s) / ED Diagnoses Final diagnoses:  Atrial fibrillation, unspecified type ([place])  Pleural effusion on right  Community acquired pneumonia of right lower lobe of lung  Hypertension, unspecified type    Rx / DC Orders ED Discharge Orders          Ordered    amLODipine (NORVASC) 5 MG tablet  Daily        09/27/22 0020    azithromycin (ZITHROMAX) 250 MG tablet  Daily        09/27/22 0020    cephALEXin (KEFLEX) 500 MG capsule  3 times daily        09/27/22 0020    Ambulatory referral to Pulmonology        09/27/22 0021              Gareth Morgan, MD 09/27/22 1158

## 2022-09-27 ENCOUNTER — Encounter (HOSPITAL_COMMUNITY): Payer: Self-pay

## 2022-09-27 ENCOUNTER — Emergency Department (HOSPITAL_COMMUNITY): Payer: Medicare Other

## 2022-09-27 ENCOUNTER — Inpatient Hospital Stay (HOSPITAL_COMMUNITY)
Admission: EM | Admit: 2022-09-27 | Discharge: 2022-10-03 | DRG: 187 | Disposition: A | Discharge status: Skilled Nursing Facility | Payer: Medicare Other | Attending: Student | Admitting: Student

## 2022-09-27 ENCOUNTER — Other Ambulatory Visit: Payer: Self-pay

## 2022-09-27 DIAGNOSIS — Z515 Encounter for palliative care: Secondary | ICD-10-CM

## 2022-09-27 DIAGNOSIS — Z8673 Personal history of transient ischemic attack (TIA), and cerebral infarction without residual deficits: Secondary | ICD-10-CM | POA: Diagnosis not present

## 2022-09-27 DIAGNOSIS — Z9841 Cataract extraction status, right eye: Secondary | ICD-10-CM

## 2022-09-27 DIAGNOSIS — F419 Anxiety disorder, unspecified: Secondary | ICD-10-CM | POA: Diagnosis present

## 2022-09-27 DIAGNOSIS — R627 Adult failure to thrive: Secondary | ICD-10-CM | POA: Diagnosis present

## 2022-09-27 DIAGNOSIS — Z1152 Encounter for screening for COVID-19: Secondary | ICD-10-CM

## 2022-09-27 DIAGNOSIS — R911 Solitary pulmonary nodule: Secondary | ICD-10-CM | POA: Diagnosis present

## 2022-09-27 DIAGNOSIS — J918 Pleural effusion in other conditions classified elsewhere: Secondary | ICD-10-CM | POA: Diagnosis present

## 2022-09-27 DIAGNOSIS — F03918 Unspecified dementia, unspecified severity, with other behavioral disturbance: Secondary | ICD-10-CM | POA: Diagnosis present

## 2022-09-27 DIAGNOSIS — R54 Age-related physical debility: Secondary | ICD-10-CM | POA: Diagnosis present

## 2022-09-27 DIAGNOSIS — Z8249 Family history of ischemic heart disease and other diseases of the circulatory system: Secondary | ICD-10-CM

## 2022-09-27 DIAGNOSIS — J942 Hemothorax: Principal | ICD-10-CM | POA: Diagnosis present

## 2022-09-27 DIAGNOSIS — I129 Hypertensive chronic kidney disease with stage 1 through stage 4 chronic kidney disease, or unspecified chronic kidney disease: Secondary | ICD-10-CM | POA: Diagnosis present

## 2022-09-27 DIAGNOSIS — Z818 Family history of other mental and behavioral disorders: Secondary | ICD-10-CM

## 2022-09-27 DIAGNOSIS — E876 Hypokalemia: Secondary | ICD-10-CM | POA: Diagnosis present

## 2022-09-27 DIAGNOSIS — I10 Essential (primary) hypertension: Secondary | ICD-10-CM

## 2022-09-27 DIAGNOSIS — Z90710 Acquired absence of both cervix and uterus: Secondary | ICD-10-CM

## 2022-09-27 DIAGNOSIS — M1120 Other chondrocalcinosis, unspecified site: Secondary | ICD-10-CM | POA: Diagnosis present

## 2022-09-27 DIAGNOSIS — M25531 Pain in right wrist: Secondary | ICD-10-CM | POA: Diagnosis present

## 2022-09-27 DIAGNOSIS — D638 Anemia in other chronic diseases classified elsewhere: Secondary | ICD-10-CM | POA: Diagnosis present

## 2022-09-27 DIAGNOSIS — S2241XD Multiple fractures of ribs, right side, subsequent encounter for fracture with routine healing: Secondary | ICD-10-CM

## 2022-09-27 DIAGNOSIS — F03C18 Unspecified dementia, severe, with other behavioral disturbance: Secondary | ICD-10-CM | POA: Diagnosis present

## 2022-09-27 DIAGNOSIS — J189 Pneumonia, unspecified organism: Secondary | ICD-10-CM | POA: Diagnosis present

## 2022-09-27 DIAGNOSIS — Z7901 Long term (current) use of anticoagulants: Secondary | ICD-10-CM

## 2022-09-27 DIAGNOSIS — Z823 Family history of stroke: Secondary | ICD-10-CM

## 2022-09-27 DIAGNOSIS — I4891 Unspecified atrial fibrillation: Secondary | ICD-10-CM | POA: Diagnosis not present

## 2022-09-27 DIAGNOSIS — F29 Unspecified psychosis not due to a substance or known physiological condition: Secondary | ICD-10-CM | POA: Diagnosis present

## 2022-09-27 DIAGNOSIS — W01198A Fall on same level from slipping, tripping and stumbling with subsequent striking against other object, initial encounter: Secondary | ICD-10-CM | POA: Diagnosis not present

## 2022-09-27 DIAGNOSIS — E785 Hyperlipidemia, unspecified: Secondary | ICD-10-CM | POA: Diagnosis present

## 2022-09-27 DIAGNOSIS — Z9842 Cataract extraction status, left eye: Secondary | ICD-10-CM

## 2022-09-27 DIAGNOSIS — Z79899 Other long term (current) drug therapy: Secondary | ICD-10-CM | POA: Diagnosis not present

## 2022-09-27 DIAGNOSIS — F03C4 Unspecified dementia, severe, with anxiety: Secondary | ICD-10-CM | POA: Diagnosis present

## 2022-09-27 DIAGNOSIS — W19XXXD Unspecified fall, subsequent encounter: Secondary | ICD-10-CM | POA: Diagnosis present

## 2022-09-27 DIAGNOSIS — I4819 Other persistent atrial fibrillation: Secondary | ICD-10-CM | POA: Diagnosis present

## 2022-09-27 DIAGNOSIS — F0390 Unspecified dementia without behavioral disturbance: Secondary | ICD-10-CM

## 2022-09-27 DIAGNOSIS — Z91040 Latex allergy status: Secondary | ICD-10-CM | POA: Diagnosis not present

## 2022-09-27 DIAGNOSIS — J90 Pleural effusion, not elsewhere classified: Secondary | ICD-10-CM | POA: Diagnosis present

## 2022-09-27 DIAGNOSIS — Z7189 Other specified counseling: Secondary | ICD-10-CM | POA: Diagnosis not present

## 2022-09-27 DIAGNOSIS — R0602 Shortness of breath: Secondary | ICD-10-CM | POA: Insufficient documentation

## 2022-09-27 DIAGNOSIS — Z8542 Personal history of malignant neoplasm of other parts of uterus: Secondary | ICD-10-CM

## 2022-09-27 DIAGNOSIS — J9811 Atelectasis: Secondary | ICD-10-CM | POA: Diagnosis present

## 2022-09-27 DIAGNOSIS — Z8616 Personal history of COVID-19: Secondary | ICD-10-CM

## 2022-09-27 DIAGNOSIS — N1831 Chronic kidney disease, stage 3a: Secondary | ICD-10-CM | POA: Diagnosis present

## 2022-09-27 DIAGNOSIS — N189 Chronic kidney disease, unspecified: Secondary | ICD-10-CM | POA: Diagnosis not present

## 2022-09-27 DIAGNOSIS — R791 Abnormal coagulation profile: Secondary | ICD-10-CM | POA: Diagnosis not present

## 2022-09-27 DIAGNOSIS — R7881 Bacteremia: Secondary | ICD-10-CM | POA: Diagnosis not present

## 2022-09-27 DIAGNOSIS — M25431 Effusion, right wrist: Secondary | ICD-10-CM | POA: Insufficient documentation

## 2022-09-27 DIAGNOSIS — I480 Paroxysmal atrial fibrillation: Secondary | ICD-10-CM | POA: Diagnosis not present

## 2022-09-27 DIAGNOSIS — R519 Headache, unspecified: Secondary | ICD-10-CM | POA: Diagnosis present

## 2022-09-27 MED ORDER — SENNOSIDES-DOCUSATE SODIUM 8.6-50 MG PO TABS: 1.0000 | ORAL_TABLET | Freq: Every evening | ORAL | Status: DC | PRN

## 2022-09-27 MED ORDER — AZITHROMYCIN 500 MG PO TABS
500.0000 mg | ORAL_TABLET | Freq: Every day | ORAL | Status: DC
  Administered 2022-09-28: 500 mg via ORAL
  Filled 2022-09-27: qty 1

## 2022-09-27 MED ORDER — POTASSIUM CHLORIDE 10 MEQ/100ML IV SOLN
10.0000 meq | INTRAVENOUS | Status: AC
  Filled 2022-09-27: qty 100

## 2022-09-27 MED ORDER — QUETIAPINE FUMARATE 25 MG PO TABS
50.0000 mg | ORAL_TABLET | Freq: Every day | ORAL | Status: DC
  Administered 2022-09-27 – 2022-10-01 (×5): 50 mg via ORAL
  Filled 2022-09-27 (×6): qty 2

## 2022-09-27 MED ORDER — IRBESARTAN 150 MG PO TABS
150.0000 mg | ORAL_TABLET | Freq: Every day | ORAL | Status: DC
  Administered 2022-09-28 – 2022-09-29 (×2): 150 mg via ORAL
  Filled 2022-09-27 (×3): qty 1

## 2022-09-27 MED ORDER — SODIUM CHLORIDE 0.9 % IV SOLN
1.0000 g | Freq: Once | INTRAVENOUS | Status: DC
  Filled 2022-09-27: qty 10

## 2022-09-27 MED ORDER — FELODIPINE ER 5 MG PO TB24
10.0000 mg | ORAL_TABLET | Freq: Every day | ORAL | Status: DC
  Administered 2022-09-29 – 2022-10-03 (×5): 10 mg via ORAL
  Filled 2022-09-27 (×4): qty 2
  Filled 2022-09-27: qty 1
  Filled 2022-09-27: qty 2

## 2022-09-27 MED ORDER — AZITHROMYCIN 250 MG PO TABS: 250.0000 mg | ORAL_TABLET | Freq: Every day | ORAL | 0 refills | Status: DC

## 2022-09-27 MED ORDER — RIVAROXABAN 15 MG PO TABS: 15.0000 mg | ORAL_TABLET | Freq: Every day | ORAL | Status: DC

## 2022-09-27 MED ORDER — ACETAMINOPHEN 325 MG PO TABS: 650.0000 mg | ORAL_TABLET | Freq: Four times a day (QID) | ORAL | Status: DC | PRN

## 2022-09-27 MED ORDER — FLUOXETINE HCL 10 MG PO CAPS
10.0000 mg | ORAL_CAPSULE | Freq: Every day | ORAL | Status: DC
  Administered 2022-09-28 – 2022-10-03 (×6): 10 mg via ORAL
  Filled 2022-09-27 (×7): qty 1

## 2022-09-27 MED ORDER — ACETAMINOPHEN 650 MG RE SUPP: 650.0000 mg | Freq: Four times a day (QID) | RECTAL | Status: DC | PRN

## 2022-09-27 MED ORDER — AMLODIPINE BESYLATE 5 MG PO TABS: 5.0000 mg | ORAL_TABLET | Freq: Every day | ORAL | 0 refills | Status: DC

## 2022-09-27 MED ORDER — CEPHALEXIN 500 MG PO CAPS: 500.0000 mg | ORAL_CAPSULE | Freq: Three times a day (TID) | ORAL | 0 refills | Status: DC

## 2022-09-27 MED ORDER — SODIUM CHLORIDE 0.9 % IV SOLN
500.0000 mg | Freq: Once | INTRAVENOUS | Status: AC
  Administered 2022-09-27: 500 mg via INTRAVENOUS
  Filled 2022-09-27: qty 5

## 2022-09-27 MED ORDER — SODIUM CHLORIDE 0.9 % IV SOLN
2.0000 g | INTRAVENOUS | Status: DC
  Administered 2022-09-27 – 2022-09-28 (×2): 2 g via INTRAVENOUS
  Filled 2022-09-27 (×2): qty 20

## 2022-09-27 MED ORDER — ONDANSETRON HCL 4 MG PO TABS: 4.0000 mg | ORAL_TABLET | Freq: Four times a day (QID) | ORAL | Status: DC | PRN

## 2022-09-27 MED ORDER — ONDANSETRON HCL 4 MG/2ML IJ SOLN: 4.0000 mg | Freq: Four times a day (QID) | INTRAMUSCULAR | Status: DC | PRN

## 2022-09-27 NOTE — H&P (Signed)
History and Physical    Mykeisha Dysert TKP:[id number] DOB: Jun 08, 1934 DOA: 09/27/2022  PCP: Ginger Organ., MD   Patient coming from: Home   Chief Complaint: SOB, not eating, not taking her antibiotics   HPI: Latricia Cerrito is a pleasant 89 y.o. female with medical history significant for dementia, anxiety, hypertension, PAF on Xarelto, COVID-19 infection 1 month ago, and history of CVA who presents to the emergency department with shortness of breath and refusal to eat or take her antibiotics.   Patient had been seen in the emergency department yesterday with concern for shortness of breath and fatigue, was diagnosed with pneumonia, treated with Rocephin and azithromycin, and discharged with oral antibiotics.  Patient has been refusing to take the antibiotics back at home and has not been eating today despite encouragement from her son.  She had complained of shortness of breath again.   ED Course: Upon arrival to the ED, patient is found to be afebrile and saturating mid 90s on room air with normal heart rate and elevated blood pressure.  EKG demonstrates atrial fibrillation with LAFB and LVH with repolarization abnormality.  CT chest is concerning for consolidation involving the right middle and right lower lobes with large effusion and multiple healing rib fractures.  She was treated with Rocephin and azithromycin in the ED.  Review of Systems:  ROS limited by patient's clinical condition.  Past Medical History:  Diagnosis Date   Anxiety    Arthritis    Bulging of cervical intervertebral disc    Cardiomegaly    Cataracts, bilateral    Chronic neck pain    History of endometrial cancer    History of stroke    2015 and 2016 // carotid US 8/16: Bilateral 1-39 // carotid US 2/16: Bilateral ICA 1-39   HLD (hyperlipidemia)    Hx of echocardiogram    a. Echo (11/15): Mild LVH, EF 55-60%, normal wall motion, grade 2 diastolic dysfunction, mild LAE, normal RV function, PASP 29 mm Hg  // echo 8/16:Mild concentric LVH, EF 55-60, normal wall motion, grade 2 diastolic dysfunction, mild LAE, PASP 39   Hypertension    Insomnia    Lung nodule    OA (osteoarthritis)    Pectus excavatum    Persistent atrial fibrillation ([place])    Vertigo     Past Surgical History:  Procedure Laterality Date   ABDOMINAL HYSTERECTOMY  2011   CATARACT EXTRACTION, BILATERAL Bilateral 2009   CESAREAN SECTION  1975   LAPAROSCOPIC HYSTERECTOMY  2011    Social History:   reports that she has never smoked. She has never used smokeless tobacco. She reports that she does not drink alcohol and does not use drugs.  Allergies  Allergen Reactions   Hydrochlorothiazide Hives   Penicillins Hives    Has patient had a PCN reaction causing immediate rash, facial/tongue/throat swelling, SOB or lightheadedness with hypotension: Yes Has patient had a PCN reaction causing severe rash involving mucus membranes or skin necrosis: No Has patient had a PCN reaction that required hospitalization No Has patient had a PCN reaction occurring within the last 10 years: No If all of the above answers are "NO", then may proceed with Cephalosporin use.    Atorvastatin Other (See Comments)    "Weakness"   Ciprofloxacin Other (See Comments) and Hypertension    Blood pressure issues- patient doesn't recall if it increased OR decreased it     Doxycycline Diarrhea and Nausea And Vomiting   Hydrocodone-Acetaminophen Other (See Comments)  Reaction not recalled   Iodinated Contrast Media Diarrhea, Other (See Comments) and Hypertension    Elevated the B/P   Iodine-131 Other (See Comments) and Hypertension    Raised the blood pressure   Latex Itching   Metoprolol Other (See Comments)    Reaction not recalled- is tolerating this in 2023   Statins Other (See Comments)    Muscle weakness   Carbamazepine Other (See Comments) and Rash    Flushed blood pressure medication out of system   Codeine Nausea And Vomiting    Nickel Rash   Sulfa Antibiotics Hives and Rash   Sulfamethoxazole Hives and Rash   Sulfonamide Derivatives Hives and Rash         Family History  Problem Relation Age of Onset   Dementia Mother    Heart attack Father    Heart disease Father    Stroke Maternal Uncle    Hypertension Neg Hx      Prior to Admission medications   Medication Sig Start Date End Date Taking? Authorizing Provider  acetaminophen (TYLENOL) 325 MG tablet Take 2 tablets (650 mg total) by mouth every 6 (six) hours as needed for mild pain (or Fever >/= 101). 08/15/22   Elgergawy, Silver Huguenin, MD  amLODipine (NORVASC) 5 MG tablet Take 1 tablet (5 mg total) by mouth daily. 09/27/22 10/27/22  Gareth Morgan, MD  azithromycin (ZITHROMAX) 250 MG tablet Take 1 tablet (250 mg total) by mouth daily. Take first 2 tablets together, then 1 every day until finished. 09/27/22   Gareth Morgan, MD  cephALEXin (KEFLEX) 500 MG capsule Take 1 capsule (500 mg total) by mouth 3 (three) times daily for 7 days. 09/27/22 10/04/22  Gareth Morgan, MD  DIOVAN 160 MG tablet Take 80 mg by mouth 2 (two) times daily. 02/16/21   [provider]  ezetimibe (ZETIA) 10 MG tablet Take 1 tablet (10 mg total) by mouth daily. 08/16/22   Elgergawy, Silver Huguenin, MD  felodipine (PLENDIL) 10 MG 24 hr tablet Take 1 tablet (10 mg total) by mouth daily. 08/16/22   Elgergawy, Silver Huguenin, MD  FLUoxetine (PROZAC) 10 MG capsule Take 10 mg by mouth daily.    [provider]  KLOR-CON M20 20 MEQ tablet Take 10 mEq by mouth daily.    [provider]  Meclizine HCl 25 MG CHEW Chew 25 mg by mouth every 8 (eight) hours as needed (for dizziness). 06/03/21   [provider]  metoprolol tartrate (LOPRESSOR) 50 MG tablet Take 1 tablet (50 mg total) by mouth 2 (two) times daily. 08/15/22   Elgergawy, Silver Huguenin, MD  QUEtiapine (SEROQUEL) 50 MG tablet Take 1 tablet (50 mg total) by mouth at bedtime. 08/15/22   Elgergawy, Silver Huguenin, MD  Rivaroxaban  (XARELTO) 15 MG TABS tablet Take 1 tablet (15 mg total) by mouth daily with supper. 03/06/22   Deboraha Sprang, MD  TYLENOL PM EXTRA STRENGTH 500-25 MG TABS tablet Take 2 tablets by mouth at bedtime.    [provider]    Physical Exam: Vitals:   09/27/22 1900 09/27/22 2000 09/27/22 2100 09/27/22 2125  BP: (!) 166/116 (!) 142/101 (!) 165/114 (!) 182/115  Pulse: 90 87 98 (!) 109  Resp:  17  17  Temp:  98.4 F (36.9 C)    TempSrc:      SpO2: 99% 93% 99% 98%  Weight:      Height:        Constitutional: NAD, calm  Eyes: PERTLA, lids  and conjunctivae normal ENMT: Mucous membranes are moist. Posterior pharynx clear of any exudate or lesions.   Neck: supple, no masses  Respiratory: Diminished on the right with coarse rales, no wheezing. No accessory muscle use.  Cardiovascular: S1 & S2 heard, regular rate and rhythm. No JVD. Abdomen: No distension, no tenderness, soft. Bowel sounds active.  Musculoskeletal: no clubbing / cyanosis. No joint deformity upper and lower extremities.   Skin: no significant rashes, lesions, ulcers. Warm, dry, well-perfused. Neurologic: CN 2-12 grossly intact. Moving all extremitie. Alert and oriented to person and place only.  Psychiatric: Pleasant. Cooperative.    Labs and Imaging on Admission: I have personally reviewed following labs and imaging studies  CBC: Recent Labs  Lab 09/26/22 1554  WBC 5.2  NEUTROABS 3.3  HGB 12.2  HCT 37.8  MCV 99.2  PLT 315   Basic Metabolic Panel: Recent Labs  Lab 09/26/22 1554 09/26/22 1925  NA 137 139  K 3.9 3.4*  CL 103 113*  CO2 17* 15*  GLUCOSE 88 89  BUN 11 9  CREATININE 1.00 0.82  CALCIUM 8.9 7.0*   GFR: Estimated Creatinine Clearance: 46.6 mL/min (by C-G formula based on SCr of 0.82 mg/dL). Liver Function Tests: Recent Labs  Lab 09/26/22 1554 09/26/22 1925  AST 25 25  ALT QUANTITY NOT SUFFICIENT, UNABLE TO PERFORM TEST 10  ALKPHOS 69 54  BILITOT QUANTITY NOT SUFFICIENT, UNABLE TO  PERFORM TEST 1.0  PROT 5.6* 4.2*  ALBUMIN 3.2* 2.5*   No results for input(s): "LIPASE", "AMYLASE" in the last 168 hours. No results for input(s): "AMMONIA" in the last 168 hours. Coagulation Profile: No results for input(s): "INR", "PROTIME" in the last 168 hours. Cardiac Enzymes: No results for input(s): "CKTOTAL", "CKMB", "CKMBINDEX", "TROPONINI" in the last 168 hours. BNP (last 3 results) No results for input(s): "PROBNP" in the last 8760 hours. HbA1C: No results for input(s): "HGBA1C" in the last 72 hours. CBG: No results for input(s): "GLUCAP" in the last 168 hours. Lipid Profile: No results for input(s): "CHOL", "HDL", "LDLCALC", "TRIG", "CHOLHDL", "LDLDIRECT" in the last 72 hours. Thyroid Function Tests: No results for input(s): "TSH", "T4TOTAL", "FREET4", "T3FREE", "THYROIDAB" in the last 72 hours. Anemia Panel: No results for input(s): "VITAMINB12", "FOLATE", "FERRITIN", "TIBC", "IRON", "RETICCTPCT" in the last 72 hours. Urine analysis:    Component Value Date/Time   COLORURINE STRAW (A) 08/26/2022 2355   APPEARANCEUR CLEAR 08/26/2022 2355   LABSPEC 1.008 08/26/2022 2355   PHURINE 7.0 08/26/2022 2355   GLUCOSEU NEGATIVE 08/26/2022 2355   HGBUR NEGATIVE 08/26/2022 2355   BILIRUBINUR NEGATIVE 08/26/2022 2355   KETONESUR NEGATIVE 08/26/2022 2355   PROTEINUR NEGATIVE 08/26/2022 2355   UROBILINOGEN 0.2 05/22/2015 1950   NITRITE NEGATIVE 08/26/2022 2355   LEUKOCYTESUR NEGATIVE 08/26/2022 2355   Sepsis Labs: '@LABRCNTIP'$ (procalcitonin:4,lacticidven:4) ) Recent Results (from the past 240 hour(s))  Blood culture (routine x 2)     Status: None (Preliminary result)   Collection Time: 09/26/22  6:00 PM   Specimen: BLOOD  Result Value Ref Range Status   Specimen Description BLOOD LEFT ANTECUBITAL  Final   Special Requests   Final    BOTTLES DRAWN AEROBIC AND ANAEROBIC Blood Culture results may not be optimal due to an inadequate volume of blood received in culture bottles    Culture  Setup Time   Final    GRAM POSITIVE COCCI IN CLUSTERS ANAEROBIC BOTTLE ONLY CRITICAL RESULT CALLED TO, READ BACK BY AND VERIFIED WITH: RN WHITNEY HICKS ON 09/27/22 @ 2138 BY  DRT Performed at [facility], [place] [address] [place], [place] [address]    Culture GRAM POSITIVE COCCI IN CLUSTERS  Final   Report Status PENDING  Incomplete  Blood culture (routine x 2)     Status: None (Preliminary result)   Collection Time: 09/26/22  6:00 PM   Specimen: BLOOD  Result Value Ref Range Status   Specimen Description BLOOD RIGHT ANTECUBITAL  Final   Special Requests   Final    BOTTLES DRAWN AEROBIC AND ANAEROBIC Blood Culture adequate volume   Culture   Final    NO GROWTH < 24 HOURS Performed at [facility], [place] [address] [place], [place] [address]    Report Status PENDING  Incomplete     Radiological Exams on Admission: CT Chest Wo Contrast  Result Date: 09/27/2022 CLINICAL DATA:  Right basilar infiltrate and effusion on recent chest x-ray EXAM: CT CHEST WITHOUT CONTRAST TECHNIQUE: Multidetector CT imaging of the chest was performed following the standard protocol without IV contrast. RADIATION DOSE REDUCTION: This exam was performed according to the departmental dose-optimization program which includes automated exposure control, adjustment of the mA and/or kV according to patient size and/or use of iterative reconstruction technique. COMPARISON:  Chest x-ray from earlier in the same day. FINDINGS: Cardiovascular: Limited due to lack of IV contrast. Atherosclerotic calcifications of the thoracic aorta are noted. Coronary calcifications are seen. No cardiac enlargement is noted. Mediastinum/Nodes: The esophagus is within normal limits. Thoracic inlet is unremarkable. Scattered small mediastinal and hilar lymph nodes are noted likely reactive in nature. Lungs/Pleura: Left lung is well aerated without focal infiltrate or sizable effusion. Minimal scarring is noted in the left  base. Right lung shows evidence of near complete collapse of the right lower lobe as well as collapse of the right middle lobe. Associated large effusion is noted. Some scattered calcifications are noted within the collapsed lower lobe consistent with prior granulomatous disease. A small less than 5 mm nodule is noted in the right upper lobe best seen on image number 28 of series 8. Upper Abdomen: Visualized upper abdomen shows no acute abnormality. Musculoskeletal: Degenerative changes of the thoracic spine are noted. Multiple healing rib fractures are identified on the right laterally and posterolaterally to include the 8 through eleventh ribs. No pneumothorax is noted. IMPRESSION: Consolidation involving the right middle and right lower lobes consistent with pneumonia and associated large effusion. Multiple right rib fractures are noted with callus formation consistent with healing. 4 mm right solid pulmonary nodule within the upper lobe. Per Fleischner Society Guidelines, if patient is low risk for malignancy, no routine follow-up imaging is recommended. If patient is high risk for malignancy, a non-contrast Chest CT at 12 months is optional. If performed and the nodule is stable at 12 months, no further follow-up is recommended. These guidelines do not apply to immunocompromised patients and patients with cancer. Follow up in patients with significant comorbidities as clinically warranted. For lung cancer screening, adhere to Lung-RADS guidelines. Reference: Radiology. 2017; 284(1):228-43. Changes consistent with prior granulomatous disease. Aortic Atherosclerosis (ICD10-I70.0). Electronically Signed   By: Inez Catalina M.D.   On: 09/27/2022 19:44   DG Chest 2 View  Result Date: 09/27/2022 CLINICAL DATA:  Follow up pneumonia EXAM: CHEST - 2 VIEW COMPARISON:  One-view x-ray 09/26/2022 and older FINDINGS: Moderate right pleural effusion. Adjacent opacity. Hyperinflation. Left lung is clear. No edema. Enlarged  cardiopericardial silhouette with tortuous and ectatic aorta. Osteopenia with degenerative changes. IMPRESSION: No significant interval  change when adjusting for technique. Electronically Signed   By: Jill Side M.D.   On: 09/27/2022 18:44   DG Chest Portable 1 View  Result Date: 09/26/2022 CLINICAL DATA:  Dizziness and shortness of breath EXAM: PORTABLE CHEST 1 VIEW COMPARISON:  08/26/2022 FINDINGS: Developing moderate right pleural effusion with adjacent opacity. Hyperinflation. Chronic interstitial changes. No consolidation in the left lung. No pneumothorax. Enlarged cardiopericardial silhouette with calcified aorta. Overlapping cardiac leads. The left inferior costophrenic angle is clipped at the edge of the film. IMPRESSION: Developing moderate right pleural effusion with adjacent opacity. Recommend follow-up Electronically Signed   By: Jill Side M.D.   On: 09/26/2022 16:09    EKG: Independently reviewed. Atrial fibrillation, LAFB, LVH with repolarization abnormality.   Assessment/Plan   1. Pneumonia with parapneumonic effusion  - Started on Rocephin and azithromycin in ED  - Consult IR for thoracentesis, check strep pneumo and legionella antigens, continue Rocpehin and azithromycin, trend procalcitonin   2. Atrial fibrillation  - Continue Xarelto    3. Hypertension   - Continue Diovan and felodipine    4. Dementia; anxiety - Continue Prozac and Seroquel, use delirium precautions   5. Lung nodule  - RUL nodule noted on CT in ED  - Outpatient follow-up recommended    DVT prophylaxis: Xarelto  Code Status: Full  Level of Care: Level of care: Med-Surg Family Communication: Son updated from ED   Disposition Plan:  Patient is from: home  Anticipated d/c is to: TBD Anticipated d/c date is: 09/30/22  Patient currently: Pending IR consultation for thoracentesis, transition to oral antibiotics  Consults called: None  Admission status: Inpatient     Vianne Bulls, MD [facility]  09/27/2022, 9:43 PM

## 2022-09-27 NOTE — ED Notes (Signed)
Per MD son was notified of pts discharge and is an hour away

## 2022-09-27 NOTE — ED Notes (Signed)
This RN found patient out in hallway yelling at a doctor passing by. When RN stepped into room to ask patient what is happening, she states "Nobody has been in this room to see me for hours and hours" When RN states that there has been someone in the room almost every thirty minutes, patient states "That's not true at all and I don't believe anything you say to me" Patient instructed to get back into bed before falling and patient holds fists up to makes a punching motion towards this RN. Opyd, MD notified of patient's increased agitation.

## 2022-09-27 NOTE — ED Provider Notes (Signed)
[facility] EMERGENCY DEPARTMENT Provider Note   CSN: [id number] Arrival date & time: 09/27/22  1808     History  Chief Complaint  Patient presents with   Pneumonia    Georgi Tuel is a 89 y.o. female.  Pt is a 89y/o female with hx of paroxysmal atrial fibrillation on Xarelto, dementia with baseline confusion, hypertension, stage III CKD, anemia of chronic disease with baseline hemoglobin around 12, admission in December for metabolic encephalopathy and hypoxia secondary to COVID-19 who was seen in the emerged [place] department yesterday after she called 911 because she reported she thought she was short of breath and feeling fatigued and lethargic.  At that time she was found to be in A-fib RVR which spontaneously converted prior to her arriving in the hospital.  Patient had lab work drawn yesterday that was mostly at baseline with normal white count, hemoglobin and CMP initially with a mild anion gap which improved.  Patient did have an x-ray that showed a right-sided pleural effusion and a possible opacity.  At that time patient was satting normally and did not have complaints of a cough but was covered with Rocephin and azithromycin.  She was sent home with antibiotics as a precaution but was sent back by EMS today because patient is refusing to take oral medications.  At this time patient reports that she does not think she needs the antibiotics she does not think that she has pneumonia and she otherwise has no complaints.  The history is provided by the patient, the EMS personnel and medical records.  Pneumonia       Home Medications Prior to Admission medications   Medication Sig Start Date End Date Taking? Authorizing Provider  acetaminophen (TYLENOL) 325 MG tablet Take 2 tablets (650 mg total) by mouth every 6 (six) hours as needed for mild pain (or Fever >/= 101). 08/15/22   Elgergawy, Silver Huguenin, MD  amLODipine (NORVASC) 5 MG tablet Take 1 tablet (5 mg total) by  mouth daily. 09/27/22 10/27/22  Gareth Morgan, MD  azithromycin (ZITHROMAX) 250 MG tablet Take 1 tablet (250 mg total) by mouth daily. Take first 2 tablets together, then 1 every day until finished. 09/27/22   Gareth Morgan, MD  cephALEXin (KEFLEX) 500 MG capsule Take 1 capsule (500 mg total) by mouth 3 (three) times daily for 7 days. 09/27/22 10/04/22  Gareth Morgan, MD  DIOVAN 160 MG tablet Take 80 mg by mouth 2 (two) times daily. 02/16/21   [provider]  ezetimibe (ZETIA) 10 MG tablet Take 1 tablet (10 mg total) by mouth daily. 08/16/22   Elgergawy, Silver Huguenin, MD  felodipine (PLENDIL) 10 MG 24 hr tablet Take 1 tablet (10 mg total) by mouth daily. 08/16/22   Elgergawy, Silver Huguenin, MD  FLUoxetine (PROZAC) 10 MG capsule Take 10 mg by mouth daily.    [provider]  KLOR-CON M20 20 MEQ tablet Take 10 mEq by mouth daily.    [provider]  Meclizine HCl 25 MG CHEW Chew 25 mg by mouth every 8 (eight) hours as needed (for dizziness). 06/03/21   [provider]  metoprolol tartrate (LOPRESSOR) 50 MG tablet Take 1 tablet (50 mg total) by mouth 2 (two) times daily. 08/15/22   Elgergawy, Silver Huguenin, MD  QUEtiapine (SEROQUEL) 50 MG tablet Take 1 tablet (50 mg total) by mouth at bedtime. 08/15/22   Elgergawy, Silver Huguenin, MD  Rivaroxaban (XARELTO) 15 MG TABS tablet Take 1 tablet (15 mg total) by  mouth daily with supper. 03/06/22   Deboraha Sprang, MD  TYLENOL PM EXTRA STRENGTH 500-25 MG TABS tablet Take 2 tablets by mouth at bedtime.    [provider]      Allergies    Hydrochlorothiazide, Penicillins, Atorvastatin, Ciprofloxacin, Doxycycline, Hydrocodone-acetaminophen, Iodinated contrast media, Iodine-131, Latex, Metoprolol, Statins, Carbamazepine, Codeine, Nickel, Sulfa antibiotics, Sulfamethoxazole, and Sulfonamide derivatives    Review of Systems   Review of Systems  Physical Exam Updated Vital Signs BP (!) 182/115   Pulse (!) 109   Temp 98.4 F (36.9 C)    Resp 17   Ht '5\' 8"'$  (1.727 m)   Wt 60 kg   SpO2 98%   BMI 20.11 kg/m  Physical Exam Vitals and nursing note reviewed.  Constitutional:      General: She is not in acute distress.    Appearance: She is well-developed.  HENT:     Head: Normocephalic and atraumatic.  Eyes:     Pupils: Pupils are equal, round, and reactive to light.  Cardiovascular:     Rate and Rhythm: Normal rate and regular rhythm.     Heart sounds: Normal heart sounds. No murmur heard.    No friction rub.  Pulmonary:     Effort: Pulmonary effort is normal.     Breath sounds: Examination of the right-lower field reveals decreased breath sounds. Decreased breath sounds present. No wheezing or rales.  Abdominal:     General: Bowel sounds are normal. There is no distension.     Palpations: Abdomen is soft.     Tenderness: There is no abdominal tenderness. There is no guarding or rebound.  Musculoskeletal:        General: No tenderness. Normal range of motion.     Comments: No edema  Skin:    General: Skin is warm and dry.     Findings: No rash.  Neurological:     Mental Status: She is alert. Mental status is at baseline.     Cranial Nerves: No cranial nerve deficit.     Comments: Oriented to person and place  Psychiatric:        Behavior: Behavior normal.     ED Results / Procedures / Treatments   Labs (all labs ordered are listed, but only abnormal results are displayed) Labs Reviewed - No data to display  EKG None  Radiology CT Chest Wo Contrast  Result Date: 09/27/2022 CLINICAL DATA:  Right basilar infiltrate and effusion on recent chest x-ray EXAM: CT CHEST WITHOUT CONTRAST TECHNIQUE: Multidetector CT imaging of the chest was performed following the standard protocol without IV contrast. RADIATION DOSE REDUCTION: This exam was performed according to the departmental dose-optimization program which includes automated exposure control, adjustment of the mA and/or kV according to patient size and/or  use of iterative reconstruction technique. COMPARISON:  Chest x-ray from earlier in the same day. FINDINGS: Cardiovascular: Limited due to lack of IV contrast. Atherosclerotic calcifications of the thoracic aorta are noted. Coronary calcifications are seen. No cardiac enlargement is noted. Mediastinum/Nodes: The esophagus is within normal limits. Thoracic inlet is unremarkable. Scattered small mediastinal and hilar lymph nodes are noted likely reactive in nature. Lungs/Pleura: Left lung is well aerated without focal infiltrate or sizable effusion. Minimal scarring is noted in the left base. Right lung shows evidence of near complete collapse of the right lower lobe as well as collapse of the right middle lobe. Associated large effusion is noted. Some scattered calcifications are noted within the collapsed lower lobe consistent  with prior granulomatous disease. A small less than 5 mm nodule is noted in the right upper lobe best seen on image number 28 of series 8. Upper Abdomen: Visualized upper abdomen shows no acute abnormality. Musculoskeletal: Degenerative changes of the thoracic spine are noted. Multiple healing rib fractures are identified on the right laterally and posterolaterally to include the 8 through eleventh ribs. No pneumothorax is noted. IMPRESSION: Consolidation involving the right middle and right lower lobes consistent with pneumonia and associated large effusion. Multiple right rib fractures are noted with callus formation consistent with healing. 4 mm right solid pulmonary nodule within the upper lobe. Per Fleischner Society Guidelines, if patient is low risk for malignancy, no routine follow-up imaging is recommended. If patient is high risk for malignancy, a non-contrast Chest CT at 12 months is optional. If performed and the nodule is stable at 12 months, no further follow-up is recommended. These guidelines do not apply to immunocompromised patients and patients with cancer. Follow up in  patients with significant comorbidities as clinically warranted. For lung cancer screening, adhere to Lung-RADS guidelines. Reference: Radiology. 2017; 284(1):228-43. Changes consistent with prior granulomatous disease. Aortic Atherosclerosis (ICD10-I70.0). Electronically Signed   By: Inez Catalina M.D.   On: 09/27/2022 19:44   DG Chest 2 View  Result Date: 09/27/2022 CLINICAL DATA:  Follow up pneumonia EXAM: CHEST - 2 VIEW COMPARISON:  One-view x-ray 09/26/2022 and older FINDINGS: Moderate right pleural effusion. Adjacent opacity. Hyperinflation. Left lung is clear. No edema. Enlarged cardiopericardial silhouette with tortuous and ectatic aorta. Osteopenia with degenerative changes. IMPRESSION: No significant interval change when adjusting for technique. Electronically Signed   By: Jill Side M.D.   On: 09/27/2022 18:44   DG Chest Portable 1 View  Result Date: 09/26/2022 CLINICAL DATA:  Dizziness and shortness of breath EXAM: PORTABLE CHEST 1 VIEW COMPARISON:  08/26/2022 FINDINGS: Developing moderate right pleural effusion with adjacent opacity. Hyperinflation. Chronic interstitial changes. No consolidation in the left lung. No pneumothorax. Enlarged cardiopericardial silhouette with calcified aorta. Overlapping cardiac leads. The left inferior costophrenic angle is clipped at the edge of the film. IMPRESSION: Developing moderate right pleural effusion with adjacent opacity. Recommend follow-up Electronically Signed   By: Jill Side M.D.   On: 09/26/2022 16:09    Procedures Procedures    Medications Ordered in ED Medications  cefTRIAXone (ROCEPHIN) 1 g in sodium chloride 0.9 % 100 mL IVPB (has no administration in time range)  azithromycin (ZITHROMAX) 500 mg in sodium chloride 0.9 % 250 mL IVPB (has no administration in time range)    ED Course/ Medical Decision Making/ A&P                             Medical Decision Making Amount and/or Complexity of Data Reviewed Radiology: ordered  and independent interpretation performed. Decision-making details documented in ED Course.   Pt with multiple medical problems and comorbidities and presenting today with a complaint that caries a high risk for morbidity and mortality.  Sent back to the hospital today by her son because patient does not want to take antibiotics she was prescribed yesterday for possible pneumonia.  On exam patient is well-appearing.  She is satting at 100% on room air in no acute distress.  She has no tachypnea and is breathing comfortably.  Patient is in a sinus rhythm today although yesterday it appeared that she had atrial fibrillation with RVR.  She was covered prophylactically with antibiotics given possible  opacity on chest x-ray.   9:33 PM Spoke with patient's son who reports after getting home from the hospital last night patient was very paranoid and thought the doctor was trying to kill her and did not want to take any of the new medications.  He was able to get her to eat a little bit and take her regular medications.  However this evening she started complaining of having pain all over and being short of breath when she laid down which concerned him and he sent her back for evaluation.  I have independently visualized and interpreted pt's images today.  X-ray today with AP and lateral with significant right-sided pleural effusion which was not present on x-ray in December.  Possibility for pneumonia but also could be related to cardiac cause, new mass.  Low suspicion for PE at this time as patient is anticoagulated.  9:33 PM CT shows a consolidation in the middle and lower right lobes consistent with pneumonia and a large effusion.  She also has a right solid pulmonary nodule which she is low risk so does not need routine follow-up.  Suspect this is post COVID-pneumonia.  However when talking with her son she is refusing oral meds and he could barely get her to eat anything today.  Will admit for IV antibiotics  due to poor compliance at home.          Final Clinical Impression(s) / ED Diagnoses Final diagnoses:  Community acquired pneumonia of right lung, unspecified part of lung    Rx / DC Orders ED Discharge Orders     None         Blanchie Dessert, MD 09/27/22 2133

## 2022-09-27 NOTE — ED Triage Notes (Signed)
Pt BIBGEMS today for a dx of PNA yesterday sent home w/ oral meds. Pt does not want to take medications. NAD noted.  Hx dementia

## 2022-09-27 NOTE — ED Notes (Signed)
Pt brought to Ct.

## 2022-09-28 ENCOUNTER — Inpatient Hospital Stay (HOSPITAL_COMMUNITY): Payer: Medicare Other

## 2022-09-28 DIAGNOSIS — J189 Pneumonia, unspecified organism: Secondary | ICD-10-CM | POA: Diagnosis not present

## 2022-09-28 HISTORY — PX: IR THORACENTESIS RIGHT ASP PLEURAL SPACE W/IMG GUIDE: IMG5380

## 2022-09-28 LAB — BASIC METABOLIC PANEL
Anion gap: 12 (ref 5–15)
BUN: 10 mg/dL (ref 8–23)
CO2: 20 mmol/L — ABNORMAL LOW (ref 22–32)
Calcium: 8.9 mg/dL (ref 8.9–10.3)
Chloride: 107 mmol/L (ref 98–111)
Creatinine, Ser: 1.14 mg/dL — ABNORMAL HIGH (ref 0.44–1.00)
GFR, Estimated: 47 mL/min — ABNORMAL LOW (ref >60)
Glucose, Bld: 89 mg/dL (ref 70–99)
Potassium: 4.5 mmol/L (ref 3.5–5.1)
Sodium: 139 mmol/L (ref 135–145)

## 2022-09-28 LAB — LACTATE DEHYDROGENASE, PLEURAL OR PERITONEAL FLUID: LD, Fluid: 133 U/L — ABNORMAL HIGH (ref 3–23)

## 2022-09-28 LAB — HEPATIC FUNCTION PANEL
ALT: 11 U/L (ref 0–44)
AST: 23 U/L (ref 15–41)
Albumin: 3.2 g/dL — ABNORMAL LOW (ref 3.5–5.0)
Alkaline Phosphatase: 69 U/L (ref 38–126)
Bilirubin, Direct: 0.2 mg/dL (ref 0.0–0.2)
Indirect Bilirubin: 0.3 mg/dL (ref 0.3–0.9)
Total Bilirubin: 0.5 mg/dL (ref 0.3–1.2)
Total Protein: 5.7 g/dL — ABNORMAL LOW (ref 6.5–8.1)

## 2022-09-28 LAB — CBC
HCT: 44.5 % (ref 36.0–46.0)
Hemoglobin: 14.8 g/dL (ref 12.0–15.0)
MCH: 31.9 pg (ref 26.0–34.0)
MCHC: 33.3 g/dL (ref 30.0–36.0)
MCV: 95.9 fL (ref 80.0–100.0)
Platelets: 119 10*3/uL — ABNORMAL LOW (ref 150–400)
RBC: 4.64 MIL/uL (ref 3.87–5.11)
RDW: 14.8 % (ref 11.5–15.5)
WBC: 4 10*3/uL (ref 4.0–10.5)
nRBC: 0 % (ref 0.0–0.2)

## 2022-09-28 LAB — RESPIRATORY PANEL BY PCR

## 2022-09-28 LAB — BODY FLUID CELL COUNT WITH DIFFERENTIAL
Eos, Fluid: 0 %
Lymphs, Fluid: 64 %
Monocyte-Macrophage-Serous Fluid: 16 % — ABNORMAL LOW (ref 50–90)
Neutrophil Count, Fluid: 20 % (ref 0–25)
Total Nucleated Cell Count, Fluid: 412 cu mm (ref 0–1000)

## 2022-09-28 LAB — PROTEIN, TOTAL: Total Protein: 5.7 g/dL — ABNORMAL LOW (ref 6.5–8.1)

## 2022-09-28 LAB — STREP PNEUMONIAE URINARY ANTIGEN: Strep Pneumo Urinary Antigen: NEGATIVE

## 2022-09-28 LAB — PROTEIN, PLEURAL OR PERITONEAL FLUID: Total protein, fluid: 3.1 g/dL

## 2022-09-28 LAB — MAGNESIUM: Magnesium: 1.8 mg/dL (ref 1.7–2.4)

## 2022-09-28 LAB — GLUCOSE, PLEURAL OR PERITONEAL FLUID: Glucose, Fluid: 85 mg/dL

## 2022-09-28 LAB — PROCALCITONIN: Procalcitonin: <0.1 ng/mL

## 2022-09-28 LAB — LACTATE DEHYDROGENASE: LDH: 172 U/L (ref 98–192)

## 2022-09-28 MED ORDER — POTASSIUM CHLORIDE 10 MEQ/100ML IV SOLN
10.0000 meq | INTRAVENOUS | Status: AC
  Administered 2022-09-28: 10 meq via INTRAVENOUS

## 2022-09-28 MED ORDER — LORAZEPAM 2 MG/ML IJ SOLN
0.5000 mg | Freq: Four times a day (QID) | INTRAMUSCULAR | Status: DC | PRN
  Administered 2022-09-28 – 2022-09-29 (×3): 0.5 mg via INTRAVENOUS
  Filled 2022-09-28 (×3): qty 1

## 2022-09-28 MED ORDER — METOPROLOL TARTRATE 25 MG PO TABS
25.0000 mg | ORAL_TABLET | Freq: Two times a day (BID) | ORAL | Status: DC
  Administered 2022-09-28 – 2022-09-29 (×3): 25 mg via ORAL
  Filled 2022-09-28 (×3): qty 1

## 2022-09-28 MED ORDER — POTASSIUM CHLORIDE 10 MEQ/100ML IV SOLN
10.0000 meq | Freq: Once | INTRAVENOUS | Status: AC
  Administered 2022-09-28: 10 meq via INTRAVENOUS
  Filled 2022-09-28: qty 100

## 2022-09-28 MED ORDER — RIVAROXABAN 15 MG PO TABS
15.0000 mg | ORAL_TABLET | Freq: Every day | ORAL | Status: DC
  Administered 2022-09-29 – 2022-10-02 (×4): 15 mg via ORAL
  Filled 2022-09-28 (×6): qty 1

## 2022-09-28 MED ORDER — LIDOCAINE HCL 1 % IJ SOLN
INTRAMUSCULAR | Status: AC
  Administered 2022-09-28: 9 mL via INTRADERMAL
  Filled 2022-09-28: qty 20

## 2022-09-28 NOTE — Progress Notes (Signed)
Attempted to call son Alec Mcphee. Left a voicemail to return call so that we can get consent for the patient thoracentesis

## 2022-09-28 NOTE — Progress Notes (Signed)
Progress Note   Patient: Tiffany Black NLZ:[id number] DOB: Jun 12, 1934 DOA: 09/27/2022     1 DOS: the patient was seen and examined on 09/28/2022   Brief hospital course: Taken from H&P.  Tiffany Black is a pleasant 89 y.o. female with medical history significant for dementia, anxiety, hypertension, PAF on Xarelto, COVID-19 infection 1 month ago, and history of CVA who presents to the emergency department with shortness of breath and refusal to eat or take her antibiotics.    Patient had been seen in the emergency department yesterday with concern for shortness of breath and fatigue, was diagnosed with pneumonia, treated with Rocephin and azithromycin, and discharged with oral antibiotics.  Patient has been refusing to take the antibiotics back at home and has not been eating today despite encouragement from her son.  She had complained of shortness of breath again.    ED Course: Upon arrival to the ED, patient is found to be afebrile and saturating mid 90s on room air with normal heart rate and elevated blood pressure.  EKG demonstrates atrial fibrillation with LAFB and LVH with repolarization abnormality.  CT chest is concerning for consolidation involving the right middle and right lower lobes with large effusion and multiple healing rib fractures.   She was treated with Rocephin and azithromycin in the ED.  1/18: Hemodynamically stable.  No leukocytosis, procalcitonin negative.  BNP elevated at 204. CT chest with collapse of right upper and middle lobe, concern of consolidation, less likely pneumonia as there is no other sign of infection.  Thoracentesis was ordered-pending Pulmonary consult, they are advising continuation of antibiotics until have some results back from thoracentesis for further decision. Patient remained agitated overnight and pulling her mittens and IV multiple times. She was quite somnolent after getting a small dose of Ativan this morning due to excessive agitation and  anxiety.    Assessment and Plan: * Pneumonia Bacterial pneumonia and less likely due to negative inflammatory markers and procalcitonin.  No leukocytosis or fever.  Not much upper respiratory symptoms except mild shortness of breath.  Do have right pleural effusion. Pulmonary was consulted to explore other options for significant CT abnormalities. -Thoracentesis was ordered-pending -Per pulmonary recommendations we will continue antibiotics for now until have thoracentesis labs back for further decisions. -Palliative care consult to discuss goals of care with underlying advanced dementia.  Pleural effusion Thoracentesis was ordered-pending -Follow-up thoracentesis labs  Paroxysmal atrial fibrillation with RVR (Tiffany Black) Patient was on metoprolol and Xarelto at home. Apparently refusing medications most of the time. Heart rate mildly elevated with persistent A-fib. -Restart home metoprolol -Continue Xarelto  Anxiety -Continue home Prozac and Seroquel  Dementia (Tiffany Black) -Delirium precautions  Essential hypertension Blood pressure mildly elevated.   -Restarting home metoprolol -Continue Diovan and felodipine     Lung nodule  RUL nodule noted on CT in ED  - Outpatient follow-up recommended     Subjective: Patient was seen and examined today.  She was sleeping, open eyes on sternal rub momentarily and then going back to sleep.  She received Ativan around 8:30 AM for concern of excessive anxiety and agitation  Physical Exam: Vitals:   09/27/22 2125 09/27/22 2348 09/28/22 0155 09/28/22 1217  BP: (!) 182/115 (!) 143/98 (!) 160/106 (!) 156/103  Pulse: (!) 109 85 88 94  Resp: '17 15 16 16  '$ Temp:  98 F (36.7 C) 98.2 F (36.8 C) 97.7 F (36.5 C)  TempSrc:      SpO2: 98% 97% 97% 95%  Weight:  Height:       General.  Frail and malnourished elderly lady, in no acute distress. Pulmonary.  Lungs clear bilaterally, normal respiratory effort, diminished breath sounds at right  base. CV.  Regular rate and rhythm, no JVD, rub or murmur. Abdomen.  Soft, nontender, nondistended, BS positive. CNS.  Somnolent, no apparent focal deficit Extremities.  No edema, no cyanosis, pulses intact and symmetrical. Psychiatry.  Judgment and insight appears impaired.   Data Reviewed: Prior data reviewed  Family Communication: Unable to reach son on phone.  Tried all 3 numbers listed in her chart.  No family at bedside.  Disposition: Status is: Inpatient Remains inpatient appropriate because: Severity of illness  Planned Discharge Destination:  To be determined  DVT prophylaxis.  Xarelto Time spent: 50 minutes  This record has been created using Systems analyst. Errors have been sought and corrected,but may not always be located. Such creation errors do not reflect on the standard of care.   Author: Lorella Nimrod, MD 09/28/2022 1:36 PM  For on call review [email or web address].

## 2022-09-28 NOTE — Consult Note (Signed)
NAME:  Tiffany Black, MRN:  [id number], DOB:  1934/01/13, LOS: 1 ADMISSION DATE:  09/27/2022, CONSULTATION DATE:  09/28/22 REFERRING MD:  Reesa Chew CHIEF COMPLAINT:  Dyspnea   History of Present Illness:  Tiffany Black is a 89 y.o. female who has a PMH as below. She presented to [facility] ED 1/16 with dyspnea and fatigue. She had CXR that showed R pleural effusion and possible infiltrate. She was given a dose of Ceftriaxone and Azithromycin and discharged on Keflex and Azithromycin. She apparently had refused to take her abx given that she wasn't eating much.  On 1/17, she returned to ED with the same symptoms of mild dyspnea.  She had CT chest that demonstrated RML and RLL consolidation, right basilar atelectasis, and moderate right effusion. She also had incidental finding of 82m RUL nodule. Her WBC, lactic acid, and PCT were normal.  IR was consulted for thoracentesis. PCCM called 1/18 for RUL nodule and PNA. She is currently on room air. She is sleepy but opens eyes to voice and noxious stimuli  Pertinent  Medical History:  has Persistent atrial fibrillation ([place]; Abnormal EKG; PAC (premature atrial contraction); Vertigo; Sinusitis; Anxiety; Paroxysmal atrial fibrillation with RVR ([place]; Hypokalemia; History of stroke; Dizziness; Orthostatic hypotension; CVA (cerebral infarction); Chest pain; Essential hypertension; Headache syndrome; Atrial fibrillation with RVR ([place]; Aphasia; Acute encephalopathy; GAD (generalized anxiety disorder); TIA (transient ischemic attack); COVID-19 virus infection; Acute respiratory failure with hypoxia ([place]; Generalized weakness; Fall at home, initial encounter; Acute hyponatremia; Stage 3a chronic kidney disease (CKD) ([place]; Anemia of chronic disease; Pneumonia; Pleural effusion on right; Dementia ([place]; and Lung nodule on their problem list.   Significant Hospital Events: Including procedures, antibiotic start and stop dates in addition to other pertinent events   1/17  admit.  Interim History / Subjective:  On room air. Sleepy but arouses to voice and noxious stimuli. Appears hard of hearing but difficult to assess as she is sleeping. She does withdraw extremities to pain.  Objective:  Blood pressure (!) 160/106, pulse 88, temperature 98.2 F (36.8 C), resp. rate 16, height '5\' 8"'$  (1.727 m), weight 60 kg, SpO2 97 %.        Intake/Output Summary (Last 24 hours) at 09/28/2022 1009 Last data filed at 09/27/2022 2312 Gross per 24 hour  Intake 350 ml  Output --  Net 350 ml   Filed Weights   09/27/22 1817  Weight: 60 kg    Examination: General: Elderly female, chronically ill appearing, resting in bed, in NAD. Neuro: Sleepy but arouses to voice and noxious stimuli. MAE to pain. HEENT: [place]/AT. Sclerae anicteric. Cardiovascular: RRR, no M/R/G.  Lungs: Respirations even and unlabored.  CTA bilaterally but slightly diminished R base, No W/R/R. Abdomen: BS x 4, soft, NT/ND.  Musculoskeletal: No gross deformities, no edema.  Skin: Intact, warm, no rashes.  Labs/imaging personally reviewed:  CT chest 1/17 > RML and RLL consolidation, right basilar atelectasis, and moderate right effusion.  451mRUL nodule.   Assessment & Plan:   Right pleural effusion - unclear etiology at this point. - Agree with IR thoracentesis, send for usual studies. - Would hold Xarelto in anticipation of above. - Reasonable to continue abx for now but would d/c if gram stain and cell counts are negative.  Possible CAP - doubtful given normal WBC, lactate, PCT. - See above re abx. - Check RVP as this could likely be viral and doubt bacterial. - Push pulm hygiene.  RUL nodule - incidental finding, 52m61m- F/u CT  in 1 year.  PCCM will follow peripherally until thoracentesis studies are back. Otherwise continue supportive care as you are doing. No further recs for now.   Best practice (evaluated daily):  Per primary team.  Labs   CBC: Recent Labs  Lab 09/26/22 1554  09/28/22 0400  WBC 5.2 4.0  NEUTROABS 3.3  --   HGB 12.2 14.8  HCT 37.8 44.5  MCV 99.2 95.9  PLT 168 119*    Basic Metabolic Panel: Recent Labs  Lab 09/26/22 1554 09/26/22 1925 09/28/22 0400 09/28/22 0626  NA 137 139 139  --   K 3.9 3.4* 4.5  --   CL 103 113* 107  --   CO2 17* 15* 20*  --   GLUCOSE 88 89 89  --   BUN '11 9 10  '$ --   CREATININE 1.00 0.82 1.14*  --   CALCIUM 8.9 7.0* 8.9  --   MG  --   --   --  1.8   GFR: Estimated Creatinine Clearance: 33.6 mL/min (A) (by C-G formula based on SCr of 1.14 mg/dL (H)). Recent Labs  Lab 09/26/22 1554 09/26/22 1800 09/26/22 1925 09/28/22 0400 09/28/22 0626  PROCALCITON  --   --   --   --  <0.10  WBC 5.2  --   --  4.0  --   LATICACIDVEN  --  1.8 1.2  --   --     Liver Function Tests: Recent Labs  Lab 09/26/22 1554 09/26/22 1925 09/28/22 0626  AST '25 25 23  '$ ALT QUANTITY NOT SUFFICIENT, UNABLE TO PERFORM TEST 10 11  ALKPHOS 69 54 69  BILITOT QUANTITY NOT SUFFICIENT, UNABLE TO PERFORM TEST 1.0 0.5  PROT 5.6* 4.2* 5.7*  ALBUMIN 3.2* 2.5* 3.2*   No results for input(s): "LIPASE", "AMYLASE" in the last 168 hours. No results for input(s): "AMMONIA" in the last 168 hours.  ABG    Component Value Date/Time   HCO3 23.3 08/10/2022 0544   TCO2 24 08/10/2022 0544   O2SAT 95 08/10/2022 0544     Coagulation Profile: No results for input(s): "INR", "PROTIME" in the last 168 hours.  Cardiac Enzymes: No results for input(s): "CKTOTAL", "CKMB", "CKMBINDEX", "TROPONINI" in the last 168 hours.  HbA1C: Hgb A1c MFr Bld  Date/Time Value Ref Range Status  08/10/2022 05:39 AM 5.7 (H) 4.8 - 5.6 % Final    Comment:    (NOTE)         Prediabetes: 5.7 - 6.4         Diabetes: >6.4         Glycemic control for adults with diabetes: <7.0   12/19/2016 03:30 PM 5.5 <5.7 % Final    Comment:      For the purpose of screening for the presence of diabetes:   <5.7%       Consistent with the absence of diabetes 5.7-6.4 %    Consistent with increased risk for diabetes (prediabetes) >=6.5 %     Consistent with diabetes   This assay result is consistent with a decreased risk of diabetes.   Currently, no consensus exists regarding use of hemoglobin A1c for diagnosis of diabetes in children.   According to American Diabetes Association (ADA) guidelines, hemoglobin A1c <7.0% represents optimal control in non-pregnant diabetic patients. Different metrics may apply to specific patient populations. Standards of Medical Care in Diabetes (ADA).       CBG: No results for input(s): "GLUCAP" in the last 168 hours.  Review of  Systems:   Unable to obtain as pt is somnolent.  Past Medical History:  She,  has a past medical history of Anxiety, Arthritis, Bulging of cervical intervertebral disc, Cardiomegaly, Cataracts, bilateral, Chronic neck pain, History of endometrial cancer, History of stroke, HLD (hyperlipidemia), echocardiogram, Hypertension, Insomnia, Lung nodule, OA (osteoarthritis), Pectus excavatum, Persistent atrial fibrillation ([place]), and Vertigo.   Surgical History:   Past Surgical History:  Procedure Laterality Date   ABDOMINAL HYSTERECTOMY  2011   CATARACT EXTRACTION, BILATERAL Bilateral 2009   CESAREAN SECTION  1975   LAPAROSCOPIC HYSTERECTOMY  2011     Social History:   reports that she has never smoked. She has never used smokeless tobacco. She reports that she does not drink alcohol and does not use drugs.   Family History:  Her family history includes Dementia in her mother; Heart attack in her father; Heart disease in her father; Stroke in her maternal uncle. There is no history of Hypertension.   Allergies Allergies  Allergen Reactions   Hydrochlorothiazide Hives   Penicillins Hives    Has patient had a PCN reaction causing immediate rash, facial/tongue/throat swelling, SOB or lightheadedness with hypotension: Yes Has patient had a PCN reaction causing severe rash involving mucus  membranes or skin necrosis: No Has patient had a PCN reaction that required hospitalization No Has patient had a PCN reaction occurring within the last 10 years: No If all of the above answers are "NO", then may proceed with Cephalosporin use.    Ciprofloxacin Other (See Comments) and Hypertension    Blood pressure issues- patient doesn't recall if it increased OR decreased it     Doxycycline Diarrhea and Nausea And Vomiting   Hydrocodone-Acetaminophen Other (See Comments)    Unknown reaction   Iodinated Contrast Media Diarrhea, Other (See Comments) and Hypertension    Elevated the B/P   Iodine-131 Other (See Comments) and Hypertension    Raised the blood pressure   Latex Itching   Statins Other (See Comments)    Muscle weakness   Carbamazepine Other (See Comments) and Rash    Flushed blood pressure medication out of system   Codeine Nausea And Vomiting   Nickel Rash   Sulfa Antibiotics Hives and Rash   Sulfamethoxazole Hives and Rash   Sulfonamide Derivatives Hives and Rash          Home Medications  Prior to Admission medications   Medication Sig Start Date End Date Taking? Authorizing Provider  acetaminophen (TYLENOL) 325 MG tablet Take 2 tablets (650 mg total) by mouth every 6 (six) hours as needed for mild pain (or Fever >/= 101). 08/15/22  Yes Elgergawy, Silver Huguenin, MD  DIOVAN 160 MG tablet Take 80 mg by mouth 2 (two) times daily. 02/16/21  Yes [provider]  felodipine (PLENDIL) 10 MG 24 hr tablet Take 1 tablet (10 mg total) by mouth daily. Patient taking differently: Take 5 mg by mouth daily. 08/16/22  Yes Elgergawy, Silver Huguenin, MD  FLUoxetine (PROZAC) 10 MG capsule Take 10 mg by mouth daily.   Yes [provider]  KLOR-CON M20 20 MEQ tablet Take 10 mEq by mouth daily.   Yes [provider]  Meclizine HCl 25 MG CHEW Chew 25 mg by mouth every 8 (eight) hours as needed (for dizziness). 06/03/21  Yes [provider]  metoprolol tartrate  (LOPRESSOR) 50 MG tablet Take 1 tablet (50 mg total) by mouth 2 (two) times daily. Patient taking differently: Take 25 mg by mouth 2 (two) times  daily. 08/15/22  Yes Elgergawy, Silver Huguenin, MD  Rivaroxaban (XARELTO) 15 MG TABS tablet Take 1 tablet (15 mg total) by mouth daily with supper. 03/06/22  Yes Deboraha Sprang, MD  TYLENOL PM EXTRA STRENGTH 500-25 MG TABS tablet Take 2 tablets by mouth at bedtime as needed (for sleep).   Yes [provider]  amLODipine (NORVASC) 5 MG tablet Take 1 tablet (5 mg total) by mouth daily. Patient not taking: Reported on 09/27/2022 09/27/22 10/27/22  Gareth Morgan, MD  azithromycin (ZITHROMAX) 250 MG tablet Take 1 tablet (250 mg total) by mouth daily. Take first 2 tablets together, then 1 every day until finished. Patient not taking: Reported on 09/27/2022 09/27/22   Gareth Morgan, MD  cephALEXin (KEFLEX) 500 MG capsule Take 1 capsule (500 mg total) by mouth 3 (three) times daily for 7 days. Patient not taking: Reported on 09/27/2022 09/27/22 10/04/22  Gareth Morgan, MD  ezetimibe (ZETIA) 10 MG tablet Take 1 tablet (10 mg total) by mouth daily. Patient not taking: Reported on 09/27/2022 08/16/22   Elgergawy, Silver Huguenin, MD  QUEtiapine (SEROQUEL) 50 MG tablet Take 1 tablet (50 mg total) by mouth at bedtime. Patient not taking: Reported on 09/27/2022 08/15/22   Elgergawy, Silver Huguenin, MD     Critical care time: N/A.   Montey Hora, [facility] For pager details, please see AMION or use Epic chat  After 1900, please call [place] for cross coverage needs 09/28/2022, 10:09 AM

## 2022-09-28 NOTE — Assessment & Plan Note (Signed)
Blood pressure mildly elevated.   -Restarting home metoprolol -Continue Diovan and felodipine

## 2022-09-28 NOTE — Assessment & Plan Note (Signed)
RUL nodule noted on CT in ED  - Outpatient follow-up recommended

## 2022-09-28 NOTE — Assessment & Plan Note (Signed)
-  Delirium precautions 

## 2022-09-28 NOTE — Progress Notes (Signed)
Patients son return call and he already consent the procedure with IR.

## 2022-09-28 NOTE — Progress Notes (Signed)
2348: Patient arrived from the ED. Patient alert to self. Unable to complete admission at this time. Bed exit placed on zone 2, fall mats placed in floor for patient safety.  0015: IV K repletion started per order  0030: Patient heard talking to someone in the room. Upon entering room, patient naked in bed with IV out and in the floor. Patient states she is going to the car because her husband was ready to leave. Alcario Drought DO notified of patients behavior. Agreed to try mittens on patient.  0040: Mittens placed, patient provided new gown and sheets. New IV Placed to RFA.  0045: Patient with mittens off and IV out again. DO Gardner notified. IV team consult placed. Patient placed back in mittens. Bed exit on for patient safety.  0336: Patient continues to take off mittens and try to get out of bed.

## 2022-09-28 NOTE — Progress Notes (Signed)
Patient returned from IR she is stable BP is elevated she just took her BP medication will recheck in an hour. MD is aware that BP is elevated at this time.

## 2022-09-28 NOTE — Assessment & Plan Note (Signed)
-  Continue home Prozac and Seroquel

## 2022-09-28 NOTE — Assessment & Plan Note (Signed)
Thoracentesis was ordered-pending -Follow-up thoracentesis labs

## 2022-09-28 NOTE — Procedures (Signed)
PROCEDURE SUMMARY:  Successful image-guided right thoracentesis. Yielded 900 mL of dark red fluid. Pt tolerated procedure well. No immediate complications. EBL = trace   Specimen was sent for labs. CXR ordered.  Please see imaging section of Epic for full dictation.  Lura Em PA-C 09/28/2022 3:28 PM

## 2022-09-28 NOTE — Hospital Course (Addendum)
Taken from H&P.  Tiffany Black is a pleasant 89 y.o. female with medical history significant for dementia, anxiety, hypertension, PAF on Xarelto, COVID-19 infection 1 month ago, and history of CVA who presents to the emergency department with shortness of breath and refusal to eat or take her antibiotics.    Patient had been seen in the emergency department yesterday with concern for shortness of breath and fatigue, was diagnosed with pneumonia, treated with Rocephin and azithromycin, and discharged with oral antibiotics.  Patient has been refusing to take the antibiotics back at home and has not been eating today despite encouragement from her son.  She had complained of shortness of breath again.    ED Course: Upon arrival to the ED, patient is found to be afebrile and saturating mid 90s on room air with normal heart rate and elevated blood pressure.  EKG demonstrates atrial fibrillation with LAFB and LVH with repolarization abnormality.  CT chest is concerning for consolidation involving the right middle and right lower lobes with large effusion and multiple healing rib fractures.   She was treated with Rocephin and azithromycin in the ED.  1/18: Hemodynamically stable.  No leukocytosis, procalcitonin negative.  BNP elevated at 204. CT chest with collapse of right upper and middle lobe, concern of consolidation, less likely pneumonia as there is no other sign of infection.  Thoracentesis was ordered-pending Pulmonary consult, they are advising continuation of antibiotics until have some results back from thoracentesis for further decision. Patient remained agitated overnight and pulling her mittens and IV multiple times. She was quite somnolent after getting a small dose of Ativan this morning due to excessive agitation and anxiety.

## 2022-09-28 NOTE — Assessment & Plan Note (Signed)
Bacterial pneumonia and less likely due to negative inflammatory markers and procalcitonin.  No leukocytosis or fever.  Not much upper respiratory symptoms except mild shortness of breath.  Do have right pleural effusion. Pulmonary was consulted to explore other options for significant CT abnormalities. -Thoracentesis was ordered-pending -Per pulmonary recommendations we will continue antibiotics for now until have thoracentesis labs back for further decisions. -Palliative care consult to discuss goals of care with underlying advanced dementia.

## 2022-09-28 NOTE — Assessment & Plan Note (Signed)
Patient was on metoprolol and Xarelto at home. Apparently refusing medications most of the time. Heart rate mildly elevated with persistent A-fib. -Restart home metoprolol -Continue Xarelto

## 2022-09-29 DIAGNOSIS — I480 Paroxysmal atrial fibrillation: Secondary | ICD-10-CM | POA: Diagnosis not present

## 2022-09-29 DIAGNOSIS — J90 Pleural effusion, not elsewhere classified: Secondary | ICD-10-CM | POA: Diagnosis not present

## 2022-09-29 DIAGNOSIS — Z7189 Other specified counseling: Secondary | ICD-10-CM

## 2022-09-29 DIAGNOSIS — F03918 Unspecified dementia, unspecified severity, with other behavioral disturbance: Secondary | ICD-10-CM

## 2022-09-29 DIAGNOSIS — R0602 Shortness of breath: Secondary | ICD-10-CM | POA: Insufficient documentation

## 2022-09-29 DIAGNOSIS — Z515 Encounter for palliative care: Secondary | ICD-10-CM | POA: Diagnosis not present

## 2022-09-29 DIAGNOSIS — S2241XD Multiple fractures of ribs, right side, subsequent encounter for fracture with routine healing: Secondary | ICD-10-CM

## 2022-09-29 LAB — CBC
HCT: 37.5 % (ref 36.0–46.0)
Hemoglobin: 12.7 g/dL (ref 12.0–15.0)
MCH: 32.1 pg (ref 26.0–34.0)
MCHC: 33.9 g/dL (ref 30.0–36.0)
MCV: 94.7 fL (ref 80.0–100.0)
Platelets: 194 10*3/uL (ref 150–400)
RBC: 3.96 MIL/uL (ref 3.87–5.11)
RDW: 14.7 % (ref 11.5–15.5)
WBC: 8 10*3/uL (ref 4.0–10.5)
nRBC: 0 % (ref 0.0–0.2)

## 2022-09-29 LAB — BASIC METABOLIC PANEL
Anion gap: 9 (ref 5–15)
BUN: 10 mg/dL (ref 8–23)
CO2: 23 mmol/L (ref 22–32)
Calcium: 8.9 mg/dL (ref 8.9–10.3)
Chloride: 105 mmol/L (ref 98–111)
Creatinine, Ser: 1.14 mg/dL — ABNORMAL HIGH (ref 0.44–1.00)
GFR, Estimated: 47 mL/min — ABNORMAL LOW (ref >60)
Glucose, Bld: 99 mg/dL (ref 70–99)
Potassium: 3.4 mmol/L — ABNORMAL LOW (ref 3.5–5.1)
Sodium: 137 mmol/L (ref 135–145)

## 2022-09-29 LAB — CULTURE, BLOOD (ROUTINE X 2)

## 2022-09-29 LAB — LEGIONELLA PNEUMOPHILA SEROGP 1 UR AG: L. pneumophila Serogp 1 Ur Ag: NEGATIVE

## 2022-09-29 LAB — PROCALCITONIN: Procalcitonin: <0.1 ng/mL

## 2022-09-29 MED ORDER — METOPROLOL TARTRATE 50 MG PO TABS
50.0000 mg | ORAL_TABLET | Freq: Two times a day (BID) | ORAL | Status: DC
  Administered 2022-09-29 – 2022-10-03 (×7): 50 mg via ORAL
  Filled 2022-09-29 (×8): qty 1

## 2022-09-29 MED ORDER — POTASSIUM CHLORIDE CRYS ER 20 MEQ PO TBCR
40.0000 meq | EXTENDED_RELEASE_TABLET | Freq: Once | ORAL | Status: AC
  Administered 2022-09-29: 40 meq via ORAL
  Filled 2022-09-29: qty 2

## 2022-09-29 NOTE — Consult Note (Signed)
Palliative Medicine Inpatient Consult Note  Consulting Provider:  Lorella Nimrod, MD   Reason for consult:   [place] Palliative Medicine Consult  Reason for Consult? To discuss goals of care   09/29/2022  HPI:  Per intake H&P --> Tiffany Black is a pleasant 89 y.o. female with medical history significant for dementia, anxiety, hypertension, PAF on Xarelto, COVID-19 infection 1 month ago, and history of CVA who presents to the emergency department with shortness of breath and refusal to eat or take her antibiotics.  Palliative care has been asked to address goals of care in the setting of precipitous decline.  Clinical Assessment/Goals of Care:  *Please note that this is a verbal dictation therefore any spelling or grammatical errors are due to the "[place]" system interpretation.  I have reviewed medical records including EPIC notes, labs and imaging, received report from bedside RN, assessed the patient who is lying in bed pleasantly confused.   I called patient's son, Seraya Jobst to further discuss diagnosis prognosis, GOC, EOL wishes, disposition and options.   I introduced Palliative Medicine as specialized medical care for people living with serious illness. It focuses on providing relief from the symptoms and stress of a serious illness. The goal is to improve quality of life for both the patient and the family.  Medical History Review and Understanding:  A review of Tiffany Black's past medical history inclusive of her dementia, anxiety, hypertension, atrial fibrillation, prior COVID-19, prior CVA, and present pneumonia was held.  Social History:  Discussed that Tiffany Black was born in [place].  She was married for a number of years though is a widow.  She has multiple children though the only 1 involved in her life is her son, Tiffany Black.  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  She is a woman of faith and practices within the [employer] denomination.  Functional and Nutritional State:  Prior to hospitalization Tiffany Black was living in a single-family home with her son Tiffany Black.  She is able to help with some B ADLs such as feeding herself though she does need assistance with bathing and dressing.  Per her son they did for sure.  Have [facility] care coming in though this has since stopped as she "ran off the caregivers" when she got her strength back from prior hospitalization.  Advance Directives:  A detailed discussion was had today regarding advanced directives.  Patient's son shares that he is her surrogate decision maker and I have requested a copy of the healthcare power of attorney forms for documentation.  Code Status:  Concepts specific to code status, artifical feeding and hydration, continued IV antibiotics and rehospitalization was had.  The difference between a aggressive medical intervention path  and a palliative comfort care path for this patient at this time was had.   Encouraged patient/family to consider DNR/DNI status understanding evidenced based poor outcomes in similar hospitalized patient, as the cause of arrest is likely associated with advanced chronic/terminal illness rather than an easily reversible acute cardio-pulmonary event. I explained that DNR/DNI does not change the medical plan and it only comes into effect after a person has arrested (died).  It is a protective measure to keep [place] from harming the patient in their last moments of life.   Patient's son Tiffany Black would like more time to think about the options of resuscitation.  For now he would like to "take  things as they come".  Discussion:  A conversation was held in the setting of Megann's pneumonia as well as her acute on chronic disease burden.  Discussed the importance of planning for the future and possible additional needs that may be warranted in the near  future.  Reviewed palliative care's role in ongoing conversations as they relate to goals of care in the setting of patient's illnesses.  Reviewed patient's dementia and the progression of this disease resulting in patient no longer desiring food and water.  I shared that this is normal for the progression and things such as artificial nutrition are not proven to be beneficial in this patient population.  At this time though I shared it is encouraging that Tiffany Black has been eating and drinking sufficiently.  We reviewed that Tiffany Black's main concern is pain which is present all over.  At this time Tiffany Black would like to continue present care focus and allow time for outcomes.  Tiffany Black is willing to meet with the palliative care team on Monday at 10 AM if she remains hospitalized..  Discussed the importance of continued conversation with family and their  medical providers regarding overall plan of care and treatment options, ensuring decisions are within the context of the patients values and GOCs.  Decision Maker: Tiffany Black (Son): [phone] (Mobile)   SUMMARY OF RECOMMENDATIONS   Full code/full scope of care --> encouraged patient son to consider DNR/DNI given patient's frail state and advanced dementia  Continue current care-allow time for outcomes  Outpatient palliative care upon discharge  PMT will continue to incrementally follow though per primary team patient may discharge tomorrow  Code Status/Advance Care Planning: FULL CODE  Palliative Prophylaxis:  Aspiration, Bowel Regimen, Delirium Protocol, Frequent Pain Assessment, Oral Care, Palliative Wound Care, and Turn Reposition  Additional Recommendations (Limitations, Scope, Preferences): Continue current care  Psycho-social/Spiritual:  Desire for further Chaplaincy support: Not presently Additional Recommendations: Education on chronic disease/failure to thrive   Prognosis: High chronic disease burden with recurrent  rehospitalization's placing a patient at a high 48-monthmortality risk  Discharge Planning: Discharge patient once medically optimized  Vitals:   09/29/22 0134 09/29/22 0403  BP: (!) 136/106 (!) 159/99  Pulse:  99  Resp:  16  Temp:  98 F (36.7 C)  SpO2:  97%    Intake/Output Summary (Last 24 hours) at 09/29/2022 0657 Last data filed at 09/29/2022 0500 Gross per 24 hour  Intake 220 ml  Output 150 ml  Net 70 ml   Last Weight  Most recent update: 09/27/2022  6:17 PM    Weight  60 kg (132 lb 4.4 oz)            Gen: Frail elderly female in no acute distress HEENT: Dry mucous membranes CV: Regular rate and rhythm PULM: On room air breathing is even and unlabored ABD: soft/nontender EXT: No edema Neuro: Pleasantly disoriented  PPS: 50%   This conversation/these recommendations were discussed with patient primary care team, Dr. GCyndia Skeeters Billing based on MDM: High  ______________________________________________________ [place] Cell Phone: [phone] utilize secure chat with additional questions, if there is no response within 30 minutes please call the above phone number  Palliative Medicine Team providers are available by phone from 7am to 7pm daily and can be reached through the team cell phone.  Should this patient require assistance outside of these hours, please call the patient's attending physician.

## 2022-09-29 NOTE — IPAL (Signed)
  Interdisciplinary Goals of Care Family Meeting   Date carried out: 09/29/2022  Location of the meeting: Phone conference  Member's involved: Physician and Family Member or next of kin, patient's son Evlyn Courier Power of Attorney or Loss adjuster, chartered: Patient's son, Gerald Stabs  Discussion: We discussed goals of care for [employer] .  Patient with severe dementia.  Discussed CODE STATUS with patient's son, Gerald Stabs over the phone.  We have discussed pros and cons of CPR in light of patient's comorbidity including but not limited to severe dementia, advanced age, multiple rib fractures and A-fib.  Purpose of CPR and intubation is revival after cardiopulmonary arrest.  Chance of revival less than 10% based on studies and evidences. Cons include pain, distress, rib fracture, anoxic brain injury, poor quality of life and increased risk of cardiopulmonary arrest afterward.  Gerald Stabs voiced understanding but wanted to continue full code and full scope of care for now.  Palliative medicine has been consulted.  Code status:   Code Status: Full Code   Disposition: Continue current acute care  Time spent for the meeting: 30    Mercy Riding, MD  09/29/2022, 2:41 PM

## 2022-09-29 NOTE — Progress Notes (Signed)
Patients B?P manually 136/106 with dynamap 156/107 pt had 25 mg lopessor earlier . MD aware no new orders received.

## 2022-09-29 NOTE — Progress Notes (Signed)
CSW spoke with Patricia Nettle, APS worker at [facility] who states she is involved with this patient. Nichole states she has attempted to reach patient's son without success. Nichole requested CSW send referral to financial counseling to screen for possible Medicaid benefits. Nichole states she has been unsuccessful in contacting the patient's son for discussion.  CSW sent secure email to have patient screened for potential Medicaid benefits.   Madilyn Fireman, MSW, LCSW Transitions of Care  Clinical Social Worker II [phone]

## 2022-09-29 NOTE — Plan of Care (Signed)

## 2022-09-29 NOTE — Progress Notes (Signed)
PROGRESS NOTE  Tiffany Black RKY:[id number] DOB: [birth year]   PCP: Ginger Organ., MD  Patient is from: SNF  DOA: 09/27/2022 LOS: 2  Chief complaints Chief Complaint  Patient presents with   Pneumonia     Brief Narrative / Interim history: 89 year old F with PMH of dementia, paroxysmal A-fib on Xarelto, CVA, anxiety recent COVID-19 infection about a month ago presenting with shortness of breath, refusal to eat or take her antibiotics for  pneumonia.  Seen in ED the day prior to presentation and diagnosed with pneumonia and discharged on oral antibiotics.   In ED, stable vitals.  No leukocytosis.  CT chest concerning for consolidation involving the RML and RLL with large effusion and multiple healing rib fractures.  She was started on CTX and azithromycin.  Underwent right thoracocentesis with removal of 900 mL dark red exudative fluid on 1/18.  Pulmonology consulted by previous attending and recommended continuing antibiotics.   Subjective: Seen and examined earlier this morning.  No major events overnight of this morning.  No complaints.  She is oriented to self, place but not time.  Follows commands.  Denies chest pain, shortness of breath or cough.  She thinks she is seeing dogs outside the window.  Objective: Vitals:   09/29/22 0115 09/29/22 0134 09/29/22 0403 09/29/22 0821  BP: (!) 156/107 (!) 136/106 (!) 159/99 (!) 161/109  Pulse:   99 95  Resp:   16 16  Temp:   98 F (36.7 C) 97.7 F (36.5 C)  TempSrc:   Oral Oral  SpO2:   97% 95%  Weight:      Height:        Examination:  GENERAL: No apparent distress.  Nontoxic. HEENT: MMM.  Vision and hearing grossly intact.  NECK: Supple.  No apparent JVD.  RESP:  No IWOB.  Fair aeration bilaterally. CVS:  RRR. Heart sounds normal.  ABD/GI/GU: BS+. Abd soft, NTND.  MSK/EXT:  Moves extremities. No apparent deformity. No edema.  SKIN: no apparent skin lesion or wound NEURO: Awake, alert and oriented appropriately.  No  apparent focal neuro deficit. PSYCH: Calm. Normal affect.   Procedures:  1/18-IR thoracocentesis  Microbiology summarized: Full RVP nonreactive. Recent blood culture on 1/16 with Staph epidermidis in 1 out of 2 bottles likely contaminant. Pleural fluid culture NGTD.   Assessment and plan: Active Problems:   Pleural effusion on right   Paroxysmal atrial fibrillation with RVR (HCC)   Anxiety   Dementia with behavioral disturbance (HCC)   Essential hypertension   Lung nodule   Shortness of breath   Multiple fractures of ribs, right side, subsequent encounter for fracture with routine healing  Shortness of breath/right-sided pleural effusion: No fever, leukocytosis.  COVID-19, influenza and full RVP nonreactive.  Pro-Cal consistently negative.  No oxygen requirement.  She has multiple right rib fractures with callus formation and right-sided pleural effusion that yielded 900 cc dark red exudative culture negative fluid suggesting traumatic event likely from her fall around Thanksgiving.  Patient has dementia and not able to provide accurate history. -Discontinue antibiotics.  Monitor off antibiotics.  Pneumonia ruled out as of now. -Incentive spirometry  Multiple healing right-sided rib fractures: Reportedly, patient had a ground-level mechanical fall before last hospitalization last month.   Persistent A-fib: Fairly controlled heart rate. -Continue Xarelto and metoprolol  Dementia with behavioral disturbance: Seems to have some psychosis.  Reports seeing dogs.  Also refusing to eat and take medication prior to presentation. -Reorientation and delirium precautions -Continue home  Seroquel.   Anxiety: Stable. -Continue home Prozac and Seroquel   Essential hypertension: BP elevated probably from not taking her medications. -She has taken metoprolol, Avapro and felodipine this morning. -Increase metoprolol to 50 mg twice daily   RUL nodule: Noted on CT. -Outpatient follow-up  recommended   Hypokalemia -Monitor replenish as appropriate.  Goal of care counseling: Full code -See IPAL note -Palliative medicine on board.  Body mass index is 20.11 kg/m.          DVT prophylaxis:   Rivaroxaban (XARELTO) tablet 15 mg  Code Status: Full code Family Communication: Updated patient's son, Gerald Stabs over the phone. Level of care: Med-Surg Status is: Inpatient Remains inpatient appropriate because: Shortness of breath   Final disposition: SNF Consultants:  Pulmonology IR  55 minutes with more than 50% spent in reviewing records, counseling patient/family and coordinating care.   Sch Meds:  Scheduled Meds:  felodipine  10 mg Oral Daily   FLUoxetine  10 mg Oral Q1200   irbesartan  150 mg Oral Daily   metoprolol tartrate  50 mg Oral BID   QUEtiapine  50 mg Oral QHS   Rivaroxaban  15 mg Oral Q supper   Continuous Infusions:   PRN Meds:.acetaminophen **OR** acetaminophen, LORazepam, ondansetron **OR** ondansetron (ZOFRAN) IV, senna-docusate  Antimicrobials: Anti-infectives (From admission, onward)    Start     Dose/Rate Route Frequency Ordered Stop   09/28/22 2200  azithromycin (ZITHROMAX) tablet 500 mg  Status:  Discontinued        500 mg Oral Daily 09/27/22 2143 09/29/22 1155   09/27/22 2200  cefTRIAXone (ROCEPHIN) 2 g in sodium chloride 0.9 % 100 mL IVPB  Status:  Discontinued        2 g 200 mL/hr over 30 Minutes Intravenous Every 24 hours 09/27/22 2143 09/29/22 1155   09/27/22 2115  cefTRIAXone (ROCEPHIN) 1 g in sodium chloride 0.9 % 100 mL IVPB  Status:  Discontinued        1 g 200 mL/hr over 30 Minutes Intravenous  Once 09/27/22 2103 09/27/22 2149   09/27/22 2115  azithromycin (ZITHROMAX) 500 mg in sodium chloride 0.9 % 250 mL IVPB        500 mg 250 mL/hr over 60 Minutes Intravenous  Once 09/27/22 2103 09/27/22 2312        I have personally reviewed the following labs and images: CBC: Recent Labs  Lab 09/26/22 1554 09/28/22 0400  09/29/22 0626  WBC 5.2 4.0 8.0  NEUTROABS 3.3  --   --   HGB 12.2 14.8 12.7  HCT 37.8 44.5 37.5  MCV 99.2 95.9 94.7  PLT 168 119* 194   BMP &GFR Recent Labs  Lab 09/26/22 1554 09/26/22 1925 09/28/22 0400 09/28/22 0626 09/29/22 0626  NA 137 139 139  --  137  K 3.9 3.4* 4.5  --  3.4*  CL 103 113* 107  --  105  CO2 17* 15* 20*  --  23  GLUCOSE 88 89 89  --  99  BUN '11 9 10  '$ --  10  CREATININE 1.00 0.82 1.14*  --  1.14*  CALCIUM 8.9 7.0* 8.9  --  8.9  MG  --   --   --  1.8  --    Estimated Creatinine Clearance: 33.6 mL/min (A) (by C-G formula based on SCr of 1.14 mg/dL (H)). Liver & Pancreas: Recent Labs  Lab 09/26/22 1554 09/26/22 1925 09/28/22 0626  AST '25 25 23  '$ ALT QUANTITY NOT SUFFICIENT,  UNABLE TO PERFORM TEST 10 11  ALKPHOS 69 54 69  BILITOT QUANTITY NOT SUFFICIENT, UNABLE TO PERFORM TEST 1.0 0.5  PROT 5.6* 4.2* 5.7*  5.7*  ALBUMIN 3.2* 2.5* 3.2*   No results for input(s): "LIPASE", "AMYLASE" in the last 168 hours. No results for input(s): "AMMONIA" in the last 168 hours. Diabetic: No results for input(s): "HGBA1C" in the last 72 hours. No results for input(s): "GLUCAP" in the last 168 hours. Cardiac Enzymes: No results for input(s): "CKTOTAL", "CKMB", "CKMBINDEX", "TROPONINI" in the last 168 hours. No results for input(s): "PROBNP" in the last 8760 hours. Coagulation Profile: No results for input(s): "INR", "PROTIME" in the last 168 hours. Thyroid Function Tests: No results for input(s): "TSH", "T4TOTAL", "FREET4", "T3FREE", "THYROIDAB" in the last 72 hours. Lipid Profile: No results for input(s): "CHOL", "HDL", "LDLCALC", "TRIG", "CHOLHDL", "LDLDIRECT" in the last 72 hours. Anemia Panel: No results for input(s): "VITAMINB12", "FOLATE", "FERRITIN", "TIBC", "IRON", "RETICCTPCT" in the last 72 hours. Urine analysis:    Component Value Date/Time   COLORURINE STRAW (A) 08/26/2022 2355   APPEARANCEUR CLEAR 08/26/2022 2355   LABSPEC 1.008 08/26/2022 2355    PHURINE 7.0 08/26/2022 2355   GLUCOSEU NEGATIVE 08/26/2022 2355   HGBUR NEGATIVE 08/26/2022 2355   BILIRUBINUR NEGATIVE 08/26/2022 [place] 08/26/2022 2355   PROTEINUR NEGATIVE 08/26/2022 2355   UROBILINOGEN 0.2 05/22/2015 1950   NITRITE NEGATIVE 08/26/2022 2355   LEUKOCYTESUR NEGATIVE 08/26/2022 2355   Sepsis Labs: Invalid input(s): "PROCALCITONIN", "LACTICIDVEN"  Microbiology: Recent Results (from the past 240 hour(s))  Blood culture (routine x 2)     Status: Abnormal   Collection Time: 09/26/22  6:00 PM   Specimen: BLOOD  Result Value Ref Range Status   Specimen Description BLOOD LEFT ANTECUBITAL  Final   Special Requests   Final    BOTTLES DRAWN AEROBIC AND ANAEROBIC Blood Culture results may not be optimal due to an inadequate volume of blood received in culture bottles   Culture  Setup Time   Final    GRAM POSITIVE COCCI IN CLUSTERS ANAEROBIC BOTTLE ONLY CRITICAL RESULT CALLED TO, READ BACK BY AND VERIFIED WITH: RN WHITNEY HICKS ON 09/27/22 @ 2138 BY DRT    Culture (A)  Final    STAPHYLOCOCCUS EPIDERMIDIS THE SIGNIFICANCE OF ISOLATING THIS ORGANISM FROM A SINGLE SET OF BLOOD CULTURES WHEN MULTIPLE SETS ARE DRAWN IS UNCERTAIN. PLEASE NOTIFY THE MICROBIOLOGY DEPARTMENT WITHIN ONE WEEK IF SPECIATION AND SENSITIVITIES ARE REQUIRED. Performed at [facility], [place] [address], [place], [place] [address]    Report Status 09/29/2022 FINAL  Final  Blood culture (routine x 2)     Status: None (Preliminary result)   Collection Time: 09/26/22  6:00 PM   Specimen: BLOOD  Result Value Ref Range Status   Specimen Description BLOOD RIGHT ANTECUBITAL  Final   Special Requests   Final    BOTTLES DRAWN AEROBIC AND ANAEROBIC Blood Culture adequate volume   Culture   Final    NO GROWTH 3 DAYS Performed at [facility], [place] [address] [place], [place] [address]    Report Status PENDING  Incomplete  Respiratory (~20 pathogens) panel by PCR     Status: None    Collection Time: 09/28/22  9:43 AM   Specimen: Nasopharyngeal Swab; Respiratory  Result Value Ref Range Status   Adenovirus NOT DETECTED NOT DETECTED Final   Coronavirus 229E NOT DETECTED NOT DETECTED Final    Comment: (NOTE) The Coronavirus on the Respiratory  Panel, DOES NOT test for the novel  Coronavirus (2019 nCoV)    Coronavirus HKU1 NOT DETECTED NOT DETECTED Final   Coronavirus NL63 NOT DETECTED NOT DETECTED Final   Coronavirus OC43 NOT DETECTED NOT DETECTED Final   Metapneumovirus NOT DETECTED NOT DETECTED Final   Rhinovirus / Enterovirus NOT DETECTED NOT DETECTED Final   Influenza A NOT DETECTED NOT DETECTED Final   Influenza B NOT DETECTED NOT DETECTED Final   Parainfluenza Virus 1 NOT DETECTED NOT DETECTED Final   Parainfluenza Virus 2 NOT DETECTED NOT DETECTED Final   Parainfluenza Virus 3 NOT DETECTED NOT DETECTED Final   Parainfluenza Virus 4 NOT DETECTED NOT DETECTED Final   Respiratory Syncytial Virus NOT DETECTED NOT DETECTED Final   Bordetella pertussis NOT DETECTED NOT DETECTED Final   Bordetella Parapertussis NOT DETECTED NOT DETECTED Final   Chlamydophila pneumoniae NOT DETECTED NOT DETECTED Final   Mycoplasma pneumoniae NOT DETECTED NOT DETECTED Final    Comment: Performed at [facility], [place] [address] [place], [place] [address]  Body fluid culture w Gram Stain     Status: None (Preliminary result)   Collection Time: 09/28/22  5:10 PM   Specimen: Lung, Right; Pleural Fluid  Result Value Ref Range Status   Specimen Description PLEURAL RIGHT  Final   Special Requests NONE  Final   Gram Stain   Final    FEW WBC PRESENT,BOTH PMN AND MONONUCLEAR NO ORGANISMS SEEN    Culture   Final    NO GROWTH < 24 HOURS Performed at [facility], [place] [address], [place], [place] [address]    Report Status PENDING  Incomplete    Radiology Studies: IR THORACENTESIS ASP PLEURAL SPACE W/IMG GUIDE  Result Date: 09/28/2022 INDICATION: Right pleural effusion  EXAM: ULTRASOUND GUIDED RIGHT THORACENTESIS MEDICATIONS: 7 cc 1% lidocaine COMPLICATIONS: None immediate. PROCEDURE: An ultrasound guided thoracentesis was thoroughly discussed with the patient and questions answered. The benefits, risks, alternatives and complications were also discussed. The patient understands and wishes to proceed with the procedure. Written consent was obtained. Ultrasound was performed to localize and mark an adequate pocket of fluid in the right chest. The area was then prepped and draped in the normal sterile fashion. 1% Lidocaine was used for local anesthesia. Under ultrasound guidance a 6 Fr Safe-T-Centesis catheter was introduced. Thoracentesis was performed. The catheter was removed and a dressing applied. FINDINGS: A total of approximately 900 mL of dark red fluid was removed. Samples were sent to the laboratory as requested by the clinical team. IMPRESSION: Successful ultrasound guided right thoracentesis yielding 900 mL of pleural fluid. Follow-up chest x-ray revealed no evidence of pneumothorax. Read by: Reatha Armour, PA-C Electronically Signed   By: Corrie Mckusick D.O.   On: 09/28/2022 17:19   DG Chest 1 View  Result Date: 09/28/2022 CLINICAL DATA:  Pleural effusion on the right.  Post thoracentesis. EXAM: CHEST  1 VIEW COMPARISON:  Chest x-ray 09/27/2022. FINDINGS: Decreased right pleural effusion. No visible pneumothorax. Similar enlarged cardiac silhouette. No acute osseous abnormality. Polyarticular degenerative change IMPRESSION: Decreased right pleural effusion. No visible pneumothorax. Electronically Signed   By: Margaretha Sheffield M.D.   On: 09/28/2022 15:47      Evonte Prestage T[address] [place]  If 7PM-7AM, please contact night-coverage [email or web address] 09/29/2022, 12:03 PM

## 2022-09-29 NOTE — Plan of Care (Signed)
  Problem: Activity: Goal: Ability to tolerate increased activity will improve Outcome: Progressing   Problem: Pain Managment: Goal: General experience of comfort will improve Outcome: Progressing   Problem: Safety: Goal: Ability to remain free from injury will improve Outcome: Progressing   Problem: Skin Integrity: Goal: Risk for impaired skin integrity will decrease Outcome: Progressing

## 2022-09-30 ENCOUNTER — Telehealth (HOSPITAL_BASED_OUTPATIENT_CLINIC_OR_DEPARTMENT_OTHER): Payer: Self-pay | Admitting: *Deleted

## 2022-09-30 DIAGNOSIS — R0602 Shortness of breath: Secondary | ICD-10-CM | POA: Diagnosis not present

## 2022-09-30 DIAGNOSIS — Z515 Encounter for palliative care: Secondary | ICD-10-CM | POA: Diagnosis not present

## 2022-09-30 DIAGNOSIS — I480 Paroxysmal atrial fibrillation: Secondary | ICD-10-CM | POA: Diagnosis not present

## 2022-09-30 DIAGNOSIS — F03918 Unspecified dementia, unspecified severity, with other behavioral disturbance: Secondary | ICD-10-CM | POA: Diagnosis not present

## 2022-09-30 DIAGNOSIS — J90 Pleural effusion, not elsewhere classified: Secondary | ICD-10-CM | POA: Diagnosis not present

## 2022-09-30 DIAGNOSIS — R7881 Bacteremia: Secondary | ICD-10-CM

## 2022-09-30 LAB — RENAL FUNCTION PANEL
Albumin: 3.2 g/dL — ABNORMAL LOW (ref 3.5–5.0)
Anion gap: 13 (ref 5–15)
BUN: 14 mg/dL (ref 8–23)
CO2: 23 mmol/L (ref 22–32)
Calcium: 9.4 mg/dL (ref 8.9–10.3)
Chloride: 102 mmol/L (ref 98–111)
Creatinine, Ser: 1.03 mg/dL — ABNORMAL HIGH (ref 0.44–1.00)
GFR, Estimated: 53 mL/min — ABNORMAL LOW (ref >60)
Glucose, Bld: 87 mg/dL (ref 70–99)
Phosphorus: 3.3 mg/dL (ref 2.5–4.6)
Potassium: 3.9 mmol/L (ref 3.5–5.1)
Sodium: 138 mmol/L (ref 135–145)

## 2022-09-30 LAB — CBC
HCT: 41.4 % (ref 36.0–46.0)
Hemoglobin: 14.1 g/dL (ref 12.0–15.0)
MCH: 32.6 pg (ref 26.0–34.0)
MCHC: 34.1 g/dL (ref 30.0–36.0)
MCV: 95.6 fL (ref 80.0–100.0)
Platelets: 187 10*3/uL (ref 150–400)
RBC: 4.33 MIL/uL (ref 3.87–5.11)
RDW: 14.6 % (ref 11.5–15.5)
WBC: 8.3 10*3/uL (ref 4.0–10.5)
nRBC: 0 % (ref 0.0–0.2)

## 2022-09-30 LAB — MAGNESIUM: Magnesium: 1.9 mg/dL (ref 1.7–2.4)

## 2022-09-30 LAB — URIC ACID: Uric Acid, Serum: 4.1 mg/dL (ref 2.5–7.1)

## 2022-09-30 MED ORDER — POTASSIUM CHLORIDE CRYS ER 20 MEQ PO TBCR
40.0000 meq | EXTENDED_RELEASE_TABLET | Freq: Once | ORAL | Status: AC
  Administered 2022-09-30: 40 meq via ORAL
  Filled 2022-09-30: qty 2

## 2022-09-30 MED ORDER — BOOST PLUS PO LIQD
237.0000 mL | Freq: Two times a day (BID) | ORAL | Status: DC
  Administered 2022-10-01 – 2022-10-03 (×6): 237 mL via ORAL
  Filled 2022-09-30 (×11): qty 237

## 2022-09-30 MED ORDER — IRBESARTAN 300 MG PO TABS
300.0000 mg | ORAL_TABLET | Freq: Every day | ORAL | Status: DC
  Administered 2022-09-30 – 2022-10-03 (×4): 300 mg via ORAL
  Filled 2022-09-30 (×4): qty 1

## 2022-09-30 NOTE — Evaluation (Signed)
Physical Therapy Evaluation Patient Details Name: Tiffany Black MRN: [id number] DOB: 06/26/1934 Today's Date: 09/30/2022  History of Present Illness  Pt is an 89 y.o. female presenting from SNF to ED 1/17 with [facility] and refusal to eat or take her antibiotics for pneumonia. ED admission day prior with diagnosis of pneumonia and received prescription for antibiotics. CT chest concerning for consolidation involving the RML and RLL with large effusion and multiple healing rib fractures. PMHx of COVID one month ago with hospital admission, dementia, cataracts, strokes in 2015 and 2016, HLD, mild carotid atherosclerotic disease by ultrasound, HTN, insomnia and persistent atrial fibrillation (on Xarelto).   Clinical Impression  Pt presents with condition above and deficits mentioned below, see PT Problem List. Pt is a poor historian with h/o dementia and no family was present to confirm her reports of her PLOF. Pt reported she was able to ambulate without an AD. Currently, pt is requiring modA to roll, modA to transition supine <> sit, minAx2 to transfer to stand, and modAx2 to ambulate a very short distance in the room with a RW. She displays deficits in cognition, balance, activity tolerance, and overall strength. Pt has a posterior and R lateral lean, resulting in multiple LOB bouts when sitting EOB, needing min guard-minA for static sitting balance and modA for dynamic sitting balance. She is at high risk for falls and injury. Recommending pt return to her SNF for further rehab. Will continue to follow acutely.       Recommendations for follow up therapy are one component of a multi-disciplinary discharge planning process, led by the attending physician.  Recommendations may be updated based on patient status, additional functional criteria and insurance authorization.  Follow Up Recommendations Skilled nursing-short term rehab (<3 hours/day) Can patient physically be transported by private vehicle: No     Assistance Recommended at Discharge Frequent or constant Supervision/Assistance  Patient can return home with the following  Two people to help with walking and/or transfers;A lot of help with bathing/dressing/bathroom;Assistance with cooking/housework;Direct supervision/assist for medications management;Direct supervision/assist for financial management;Assist for transportation;Help with stairs or ramp for entrance    Equipment Recommendations Other (comment) (defer to next venue of care)  Recommendations for Other Services       Functional Status Assessment Patient has had a recent decline in their functional status and demonstrates the ability to make significant improvements in function in a reasonable and predictable amount of time.     Precautions / Restrictions Precautions Precautions: Fall Restrictions Weight Bearing Restrictions: No      Mobility  Bed Mobility Overal bed mobility: Needs Assistance Bed Mobility: Rolling, Sidelying to Sit, Sit to Supine Rolling: Mod assist Sidelying to sit: Mod assist, +2 for physical assistance, +2 for safety/equipment, HOB elevated   Sit to supine: Mod assist, +2 for physical assistance, +2 for safety/equipment, HOB elevated   General bed mobility comments: Pt needing extra time and repeated cues to reach to either rail to roll, modA. ModAx2 to ascend trunk to sit up, minA to bring legs off EOB. ModAx2 to manage trunk and legs back to supine.    Transfers Overall transfer level: Needs assistance Equipment used: Rolling walker (2 wheels) Transfers: Sit to/from Stand Sit to Stand: Min assist, +2 physical assistance, +2 safety/equipment           General transfer comment: MinAx2 to transfer to stand, good initiation by pt, but once standing she displayed a posterior lean.    Ambulation/Gait Ambulation/Gait assistance: Mod assist, +2 physical assistance, +  2 safety/equipment Gait Distance (Feet): 5 Feet Assistive device: Rolling  walker (2 wheels) Gait Pattern/deviations: Step-to pattern, Decreased step length - right, Decreased step length - left, Decreased stride length, Shuffle, Leaning posteriorly, Trunk flexed Gait velocity: reduced Gait velocity interpretation: <1.31 ft/sec, indicative of household ambulator   General Gait Details: Pt with very small, shuffling steps forward and back at EOB, fatiguing and sitting down fairly quickly. Pt with posterior lean, needing repeated cues to lean weight into toes and therapist advancing RW to try to facilitate an anterior weight shift but min success noted.  Stairs            Wheelchair Mobility    Modified Rankin (Stroke Patients Only)       Balance Overall balance assessment: Needs assistance Sitting-balance support: Single extremity supported, No upper extremity supported, Bilateral upper extremity supported, Feet supported Sitting balance-Leahy Scale: Fair Sitting balance - Comments: Pt with R lateral and posterior lean, varying from needing min guard-minA for static sitting balance, repeatedly cuing pt to bring L shoulder to therapist on her L or progress from leaning on L elbow to L hand eventually placed on her lap while trying to maintain midline, fair momentary success noted. ModA for dynamic sitting balance. Postural control: Posterior lean, Right lateral lean Standing balance support: Bilateral upper extremity supported, During functional activity, Reliant on assistive device for balance Standing balance-Leahy Scale: Poor Standing balance comment: Reliant on RW and up to modAx2 for standing mobility, posterior lean noted.                             Pertinent Vitals/Pain Pain Assessment Pain Assessment: Faces Faces Pain Scale: Hurts little more Pain Location: R trunk Pain Descriptors / Indicators: Discomfort, Grimacing, Guarding, Sore Pain Intervention(s): Limited activity within patient's tolerance, Monitored during session,  Repositioned    Home Living Family/patient expects to be discharged to:: Skilled nursing facility                   Additional Comments: Has been at [facility] since dc from admission on 08/15/22    Prior Function Prior Level of Function : History of Falls (last six months);Patient poor historian/Family not available             Mobility Comments: reports no use of AD though fearful of falling ADLs Comments: reports able to complete ADLs though unsure of baseline due to poor historian     Hand Dominance        Extremity/Trunk Assessment   Upper Extremity Assessment Upper Extremity Assessment: Defer to OT evaluation    Lower Extremity Assessment Lower Extremity Assessment: RLE deficits/detail;LLE deficits/detail;Generalized weakness RLE Deficits / Details: MMT scores of 4 knee extension, 4+ ankle dorsiflexion; denied numbness/tingling bil LLE Deficits / Details: MMT scores of 4+ knee extension, 4- ankle dorsiflexion; denied numbness/tingling bil    Cervical / Trunk Assessment Cervical / Trunk Assessment: Kyphotic;Other exceptions Cervical / Trunk Exceptions: tends to laterally flex trunk to the R  Communication   Communication: No difficulties  Cognition Arousal/Alertness: Awake/alert Behavior During Therapy: Anxious Overall Cognitive Status: History of cognitive impairments - at baseline                                 General Comments: Has hx of dementia. Pt needing x2 options to choose correct year, able to guess the correct month. Pt follows  simple commands with delay. Poor awareness of her tendency to lean posteriorly and to the R, needing repeated multi-modal cues to correct and improve her safety.        General Comments      Exercises     Assessment/Plan    PT Assessment Patient needs continued PT services  PT Problem List Decreased strength;Decreased activity tolerance;Decreased mobility;Decreased balance;Decreased  cognition;Decreased coordination;Decreased knowledge of use of DME;Decreased safety awareness       PT Treatment Interventions DME instruction;Gait training;Therapeutic activities;Functional mobility training;Therapeutic exercise;Balance training;Neuromuscular re-education;Patient/family education;Cognitive remediation;Wheelchair mobility training    PT Goals (Current goals can be found in the Care Plan section)  Acute Rehab PT Goals Patient Stated Goal: to reduce pain PT Goal Formulation: With patient Time For Goal Achievement: 10/14/22 Potential to Achieve Goals: Fair    Frequency Min 2X/week     Co-evaluation PT/OT/SLP Co-Evaluation/Treatment: Yes Reason for Co-Treatment: Necessary to address cognition/behavior during functional activity;For patient/therapist safety;To address functional/ADL transfers PT goals addressed during session: Mobility/safety with mobility;Balance;Proper use of DME         AM-PAC PT "6 Clicks" Mobility  Outcome Measure Help needed turning from your back to your side while in a flat bed without using bedrails?: A Lot Help needed moving from lying on your back to sitting on the side of a flat bed without using bedrails?: Total Help needed moving to and from a bed to a chair (including a wheelchair)?: Total Help needed standing up from a chair using your arms (e.g., wheelchair or bedside chair)?: Total Help needed to walk in hospital room?: Total Help needed climbing 3-5 steps with a railing? : Total 6 Click Score: 7    End of Session Equipment Utilized During Treatment: Gait belt Activity Tolerance: Patient tolerated treatment well Patient left: in bed;with call bell/phone within reach;with bed alarm set   PT Visit Diagnosis: Unsteadiness on feet (R26.81);Other abnormalities of gait and mobility (R26.89);Muscle weakness (generalized) (M62.81);Difficulty in walking, not elsewhere classified (R26.2)    Time: 7858-8502 PT Time Calculation (min)  (ACUTE ONLY): 33 min   Charges:   PT Evaluation $PT Eval Moderate Complexity: 1 Mod          Moishe Spice, PT, DPT Acute Rehabilitation Services  Office: [phone]   Orvan Falconer 09/30/2022, 2:08 PM

## 2022-09-30 NOTE — Progress Notes (Signed)
ED Antimicrobial Stewardship Positive Culture Follow Up   Tiffany Black is an 89 y.o. female who presented to [facility] on 09/26/2022 with a chief complaint of SOB Chief Complaint  Patient presents with   Shortness of Breath   Dizziness    Recent Results (from the past 720 hour(s))  Blood culture (routine x 2)     Status: Abnormal   Collection Time: 09/26/22  6:00 PM   Specimen: BLOOD  Result Value Ref Range Status   Specimen Description BLOOD LEFT ANTECUBITAL  Final   Special Requests   Final    BOTTLES DRAWN AEROBIC AND ANAEROBIC Blood Culture results may not be optimal due to an inadequate volume of blood received in culture bottles   Culture  Setup Time   Final    GRAM POSITIVE COCCI IN CLUSTERS IN BOTH AEROBIC AND ANAEROBIC BOTTLES CRITICAL RESULT CALLED TO, READ BACK BY AND VERIFIED WITH: RN WHITNEY HICKS ON 09/27/22 @ 2138 BY DRT    Culture (A)  Final    STAPHYLOCOCCUS EPIDERMIDIS THE SIGNIFICANCE OF ISOLATING THIS ORGANISM FROM A SINGLE SET OF BLOOD CULTURES WHEN MULTIPLE SETS ARE DRAWN IS UNCERTAIN. PLEASE NOTIFY THE MICROBIOLOGY DEPARTMENT WITHIN ONE WEEK IF SPECIATION AND SENSITIVITIES ARE REQUIRED. Performed at [facility], [place] [address] [place], [place] [address]    Report Status 09/29/2022 FINAL  Final  Blood culture (routine x 2)     Status: None (Preliminary result)   Collection Time: 09/26/22  6:00 PM   Specimen: BLOOD  Result Value Ref Range Status   Specimen Description BLOOD RIGHT ANTECUBITAL  Final   Special Requests   Final    BOTTLES DRAWN AEROBIC AND ANAEROBIC Blood Culture adequate volume   Culture   Final    NO GROWTH 4 DAYS Performed at [facility], [place] [address] [place], [place] [address]    Report Status PENDING  Incomplete  Respiratory (~20 pathogens) panel by PCR     Status: None   Collection Time: 09/28/22  9:43 AM   Specimen: Nasopharyngeal Swab; Respiratory  Result Value Ref Range Status   Adenovirus NOT DETECTED NOT  DETECTED Final   Coronavirus 229E NOT DETECTED NOT DETECTED Final    Comment: (NOTE) The Coronavirus on the Respiratory Panel, DOES NOT test for the novel  Coronavirus (2019 nCoV)    Coronavirus HKU1 NOT DETECTED NOT DETECTED Final   Coronavirus NL63 NOT DETECTED NOT DETECTED Final   Coronavirus OC43 NOT DETECTED NOT DETECTED Final   Metapneumovirus NOT DETECTED NOT DETECTED Final   Rhinovirus / Enterovirus NOT DETECTED NOT DETECTED Final   Influenza A NOT DETECTED NOT DETECTED Final   Influenza B NOT DETECTED NOT DETECTED Final   Parainfluenza Virus 1 NOT DETECTED NOT DETECTED Final   Parainfluenza Virus 2 NOT DETECTED NOT DETECTED Final   Parainfluenza Virus 3 NOT DETECTED NOT DETECTED Final   Parainfluenza Virus 4 NOT DETECTED NOT DETECTED Final   Respiratory Syncytial Virus NOT DETECTED NOT DETECTED Final   Bordetella pertussis NOT DETECTED NOT DETECTED Final   Bordetella Parapertussis NOT DETECTED NOT DETECTED Final   Chlamydophila pneumoniae NOT DETECTED NOT DETECTED Final   Mycoplasma pneumoniae NOT DETECTED NOT DETECTED Final    Comment: Performed at [facility], [place]. [address] [place], [place] [address]  Body fluid culture w Gram Stain     Status: None (Preliminary result)   Collection Time: 09/28/22  5:10 PM   Specimen: Lung, Right; Pleural Fluid  Result  Value Ref Range Status   Specimen Description PLEURAL RIGHT  Final   Special Requests NONE  Final   Gram Stain   Final    FEW WBC PRESENT,BOTH PMN AND MONONUCLEAR NO ORGANISMS SEEN    Culture   Final    NO GROWTH < 24 HOURS Performed at [facility], [place] [address], [place], [place] [address]    Report Status PENDING  Incomplete    '[]'$  Treated with ___, organism resistant to prescribed antimicrobial '[x]'$  Patient discharged originally without antimicrobial agent, now has positive blood culture but treatment is not indicated  Discussed with current inpatient provider: Mercy Riding, MD   Reviewed  positive 2/4 staph epi positive blood culture from 1/16 - currently not inclined to treat given no elevated WBC, afebrile, and commonly a contaminant. MD independently agrees. No additional antibiotic treatment at this time.   Vicenta Dunning, PharmD  PGY1 Pharmacy Resident  Please call [phone] with questions until 1/20 3:30pm   Thank you!

## 2022-09-30 NOTE — Progress Notes (Signed)
PROGRESS NOTE  Tiffany Black YKZ:[id number] DOB: 1934/03/11   PCP: Ginger Organ., MD  Patient is from: SNF  DOA: 09/27/2022 LOS: 3  Chief complaints Chief Complaint  Patient presents with   Pneumonia     Brief Narrative / Interim history: 89 year old F with PMH of dementia, paroxysmal A-fib on Xarelto, CVA, anxiety recent COVID-19 infection about a month ago presenting with shortness of breath, refusal to eat or take her antibiotics for  pneumonia.  Seen in ED the day prior to presentation and diagnosed with pneumonia and discharged on oral antibiotics.   In ED, stable vitals.  No leukocytosis.  CT chest concerning for consolidation involving the RML and RLL with large effusion and multiple healing rib fractures.  She was started on CTX and azithromycin.  Underwent right thoracocentesis with removal of 900 mL dark red exudative fluid on 1/18.  Fluid cultures negative.  After further review of her chart and had thoracocentesis, it felt her pleural effusion is likely from hemothorax after she sustained a fall with rib fractures.  Antibiotics discontinued.  Medically stable for discharge to SNF  Subjective: Seen and examined earlier this morning.  No major events overnight of this morning.  No complaints but not a great historian.  She is somewhat sleepy and only oriented to self today.  Objective: Vitals:   09/29/22 1608 09/29/22 2051 09/30/22 0459 09/30/22 1214  BP: 123/75 (!) 155/92 (!) 156/102 129/86  Pulse: (!) 107 100 83 90  Resp: '16 19 19 19  '$ Temp: 98.1 F (36.7 C) 98.6 F (37 C) 98.5 F (36.9 C) 98.2 F (36.8 C)  TempSrc: Oral Oral Oral Oral  SpO2: 96% 95% 96% 95%  Weight:      Height:        Examination:  GENERAL: No apparent distress.  Nontoxic. HEENT: MMM.  Vision and hearing grossly intact.  NECK: Supple.  No apparent JVD.  RESP:  No IWOB.  Fair aeration bilaterally. CVS:  RRR. Heart sounds normal.  ABD/GI/GU: BS+. Abd soft, NTND.  MSK/EXT:  Moves  extremities. No apparent deformity. No edema.  SKIN: no apparent skin lesion or wound NEURO: Sleepy but wakes to voice.  Oriented to self.  Follows commands.  No apparent focal neuro deficit. PSYCH: Calm. Normal affect.   Procedures:  1/18-IR thoracocentesis  Microbiology summarized: Full RVP nonreactive. Recent blood culture on 1/16 with Staph epidermidis in 1 out of 2 bottles likely contaminant. Pleural fluid culture NGTD.   Assessment and plan: Active Problems:   Pleural effusion on right   Paroxysmal atrial fibrillation with RVR (HCC)   Anxiety   Dementia with behavioral disturbance (HCC)   Essential hypertension   Lung nodule   Shortness of breath   Multiple fractures of ribs, right side, subsequent encounter for fracture with routine healing   Goals of care, counseling/discussion  Shortness of breath/right-sided pleural effusion: No fever, leukocytosis.  COVID-19, influenza and full RVP nonreactive.  Pro-Cal consistently negative.  No oxygen requirement.  She has multiple right rib fractures with callus formation and right-sided pleural effusion that yielded 900 cc dark red exudative culture negative fluid suggesting traumatic event likely from her fall around Thanksgiving.  Patient has dementia and not able to provide accurate history. -Remains stable off antibiotics.  Pneumonia ruled out. -Incentive spirometry, OOB/PT/OT  Multiple healing right-sided rib fractures: Reportedly, patient had a ground-level mechanical fall before last hospitalization last month.   Positive blood culture: Staph epidermidis in 1 out of 2 bottles likely contaminant.  Patient has no signs of infection.  Persistent A-fib: Fairly controlled heart rate. -Continue Xarelto and metoprolol  Dementia with behavioral disturbance/delirium: Waxing and waning mental status.  She is only oriented to self today.  No agitation or distress. -Reorientation and delirium precautions -Continue home Seroquel.    Anxiety: Stable. -Continue home Prozac and Seroquel   Essential hypertension: BP elevated probably from not taking her medications. -Increase metoprolol to 50 mg twice daily -Increase Avapro to 300 mg daily -Continue felodipine   RUL nodule: Noted on CT. -Outpatient follow-up recommended   Hypokalemia -Monitor replenish as appropriate.  Goal of care counseling: Full code -See IPAL note -Palliative medicine on board.  Inadequate oral intake Body mass index is 20.11 kg/m. Nutrition Problem: Inadequate oral intake Etiology: decreased appetite Signs/Symptoms: per patient/family report Interventions: Refer to RD note for recommendations   DVT prophylaxis:   Rivaroxaban (XARELTO) tablet 15 mg  Code Status: Full code Family Communication: Updated patient's son, Gerald Stabs over the phone. Level of care: Med-Surg Status is: Inpatient Remains inpatient appropriate because: Therapy evaluation and insurance authorization for SNF   Final disposition: SNF Consultants:  Pulmonology IR  35 minutes with more than 50% spent in reviewing records, counseling patient/family and coordinating care.   Sch Meds:  Scheduled Meds:  felodipine  10 mg Oral Daily   FLUoxetine  10 mg Oral Q1200   irbesartan  300 mg Oral Daily   lactose free nutrition  237 mL Oral BID BM   metoprolol tartrate  50 mg Oral BID   QUEtiapine  50 mg Oral QHS   Rivaroxaban  15 mg Oral Q supper   Continuous Infusions:   PRN Meds:.acetaminophen **OR** acetaminophen, LORazepam, ondansetron **OR** ondansetron (ZOFRAN) IV, senna-docusate  Antimicrobials: Anti-infectives (From admission, onward)    Start     Dose/Rate Route Frequency Ordered Stop   09/28/22 2200  azithromycin (ZITHROMAX) tablet 500 mg  Status:  Discontinued        500 mg Oral Daily 09/27/22 2143 09/29/22 1155   09/27/22 2200  cefTRIAXone (ROCEPHIN) 2 g in sodium chloride 0.9 % 100 mL IVPB  Status:  Discontinued        2 g 200 mL/hr over 30  Minutes Intravenous Every 24 hours 09/27/22 2143 09/29/22 1155   09/27/22 2115  cefTRIAXone (ROCEPHIN) 1 g in sodium chloride 0.9 % 100 mL IVPB  Status:  Discontinued        1 g 200 mL/hr over 30 Minutes Intravenous  Once 09/27/22 2103 09/27/22 2149   09/27/22 2115  azithromycin (ZITHROMAX) 500 mg in sodium chloride 0.9 % 250 mL IVPB        500 mg 250 mL/hr over 60 Minutes Intravenous  Once 09/27/22 2103 09/27/22 2312        I have personally reviewed the following labs and images: CBC: Recent Labs  Lab 09/26/22 1554 09/28/22 0400 09/29/22 0626 09/30/22 0824  WBC 5.2 4.0 8.0 8.3  NEUTROABS 3.3  --   --   --   HGB 12.2 14.8 12.7 14.1  HCT 37.8 44.5 37.5 41.4  MCV 99.2 95.9 94.7 95.6  PLT 168 119* 194 187   BMP &GFR Recent Labs  Lab 09/26/22 1554 09/26/22 1925 09/28/22 0400 09/28/22 0626 09/29/22 0626 09/30/22 0824  NA 137 139 139  --  137 138  K 3.9 3.4* 4.5  --  3.4* 3.9  CL 103 113* 107  --  105 102  CO2 17* 15* 20*  --  23 23  GLUCOSE 88 89 89  --  99 87  BUN '11 9 10  '$ --  10 14  CREATININE 1.00 0.82 1.14*  --  1.14* 1.03*  CALCIUM 8.9 7.0* 8.9  --  8.9 9.4  MG  --   --   --  1.8  --  1.9  PHOS  --   --   --   --   --  3.3   Estimated Creatinine Clearance: 37.1 mL/min (A) (by C-G formula based on SCr of 1.03 mg/dL (H)). Liver & Pancreas: Recent Labs  Lab 09/26/22 1554 09/26/22 1925 09/28/22 0626 09/30/22 0824  AST '25 25 23  '$ --   ALT QUANTITY NOT SUFFICIENT, UNABLE TO PERFORM TEST 10 11  --   ALKPHOS 69 54 69  --   BILITOT QUANTITY NOT SUFFICIENT, UNABLE TO PERFORM TEST 1.0 0.5  --   PROT 5.6* 4.2* 5.7*  5.7*  --   ALBUMIN 3.2* 2.5* 3.2* 3.2*   No results for input(s): "LIPASE", "AMYLASE" in the last 168 hours. No results for input(s): "AMMONIA" in the last 168 hours. Diabetic: No results for input(s): "HGBA1C" in the last 72 hours. No results for input(s): "GLUCAP" in the last 168 hours. Cardiac Enzymes: No results for input(s): "CKTOTAL", "CKMB",  "CKMBINDEX", "TROPONINI" in the last 168 hours. No results for input(s): "PROBNP" in the last 8760 hours. Coagulation Profile: No results for input(s): "INR", "PROTIME" in the last 168 hours. Thyroid Function Tests: No results for input(s): "TSH", "T4TOTAL", "FREET4", "T3FREE", "THYROIDAB" in the last 72 hours. Lipid Profile: No results for input(s): "CHOL", "HDL", "LDLCALC", "TRIG", "CHOLHDL", "LDLDIRECT" in the last 72 hours. Anemia Panel: No results for input(s): "VITAMINB12", "FOLATE", "FERRITIN", "TIBC", "IRON", "RETICCTPCT" in the last 72 hours. Urine analysis:    Component Value Date/Time   COLORURINE STRAW (A) 08/26/2022 2355   APPEARANCEUR CLEAR 08/26/2022 2355   LABSPEC 1.008 08/26/2022 2355   PHURINE 7.0 08/26/2022 2355   GLUCOSEU NEGATIVE 08/26/2022 2355   HGBUR NEGATIVE 08/26/2022 2355   BILIRUBINUR NEGATIVE 08/26/2022 [place] 08/26/2022 2355   PROTEINUR NEGATIVE 08/26/2022 2355   UROBILINOGEN 0.2 05/22/2015 1950   NITRITE NEGATIVE 08/26/2022 2355   LEUKOCYTESUR NEGATIVE 08/26/2022 2355   Sepsis Labs: Invalid input(s): "PROCALCITONIN", "LACTICIDVEN"  Microbiology: Recent Results (from the past 240 hour(s))  Blood culture (routine x 2)     Status: Abnormal   Collection Time: 09/26/22  6:00 PM   Specimen: BLOOD  Result Value Ref Range Status   Specimen Description BLOOD LEFT ANTECUBITAL  Final   Special Requests   Final    BOTTLES DRAWN AEROBIC AND ANAEROBIC Blood Culture results may not be optimal due to an inadequate volume of blood received in culture bottles   Culture  Setup Time   Final    GRAM POSITIVE COCCI IN CLUSTERS IN BOTH AEROBIC AND ANAEROBIC BOTTLES CRITICAL RESULT CALLED TO, READ BACK BY AND VERIFIED WITH: RN WHITNEY HICKS ON 09/27/22 @ 2138 BY DRT    Culture (A)  Final    STAPHYLOCOCCUS EPIDERMIDIS THE SIGNIFICANCE OF ISOLATING THIS ORGANISM FROM A SINGLE SET OF BLOOD CULTURES WHEN MULTIPLE SETS ARE DRAWN IS UNCERTAIN. PLEASE  NOTIFY THE MICROBIOLOGY DEPARTMENT WITHIN ONE WEEK IF SPECIATION AND SENSITIVITIES ARE REQUIRED. Performed at [facility], [place] [address], [place], [place] [address]    Report Status 09/29/2022 FINAL  Final  Blood culture (routine x 2)     Status: None (Preliminary result)   Collection Time: 09/26/22  6:00 PM   Specimen: BLOOD  Result Value Ref Range Status   Specimen Description BLOOD RIGHT ANTECUBITAL  Final   Special Requests   Final    BOTTLES DRAWN AEROBIC AND ANAEROBIC Blood Culture adequate volume   Culture   Final    NO GROWTH 4 DAYS Performed at [facility], [address], [place], [place] [address]    Report Status PENDING  Incomplete  Respiratory (~20 pathogens) panel by PCR     Status: None   Collection Time: 09/28/22  9:43 AM   Specimen: Nasopharyngeal Swab; Respiratory  Result Value Ref Range Status   Adenovirus NOT DETECTED NOT DETECTED Final   Coronavirus 229E NOT DETECTED NOT DETECTED Final    Comment: (NOTE) The Coronavirus on the Respiratory Panel, DOES NOT test for the novel  Coronavirus (2019 nCoV)    Coronavirus HKU1 NOT DETECTED NOT DETECTED Final   Coronavirus NL63 NOT DETECTED NOT DETECTED Final   Coronavirus OC43 NOT DETECTED NOT DETECTED Final   Metapneumovirus NOT DETECTED NOT DETECTED Final   Rhinovirus / Enterovirus NOT DETECTED NOT DETECTED Final   Influenza A NOT DETECTED NOT DETECTED Final   Influenza B NOT DETECTED NOT DETECTED Final   Parainfluenza Virus 1 NOT DETECTED NOT DETECTED Final   Parainfluenza Virus 2 NOT DETECTED NOT DETECTED Final   Parainfluenza Virus 3 NOT DETECTED NOT DETECTED Final   Parainfluenza Virus 4 NOT DETECTED NOT DETECTED Final   Respiratory Syncytial Virus NOT DETECTED NOT DETECTED Final   Bordetella pertussis NOT DETECTED NOT DETECTED Final   Bordetella Parapertussis NOT DETECTED NOT DETECTED Final   Chlamydophila pneumoniae NOT DETECTED NOT DETECTED Final   Mycoplasma pneumoniae NOT DETECTED NOT  DETECTED Final    Comment: Performed at [facility], [place]. [address] [place], [place] [address]  Body fluid culture w Gram Stain     Status: None (Preliminary result)   Collection Time: 09/28/22  5:10 PM   Specimen: Lung, Right; Pleural Fluid  Result Value Ref Range Status   Specimen Description PLEURAL RIGHT  Final   Special Requests NONE  Final   Gram Stain   Final    FEW WBC PRESENT,BOTH PMN AND MONONUCLEAR NO ORGANISMS SEEN    Culture   Final    NO GROWTH < 24 HOURS Performed at [facility], [place] [address] [place], [place] [address]    Report Status PENDING  Incomplete    Radiology Studies: No results found.    Torsha Lemus T. Raven  If 7PM-7AM, please contact night-coverage [email or web address] 09/30/2022, 12:19 PM

## 2022-09-30 NOTE — Evaluation (Signed)
Occupational Therapy Evaluation Patient Details Name: Kinzey Sheriff MRN: [id number] DOB: [birth year] Today's Date: 09/30/2022   History of Present Illness Pt is an 89 y.o. female presenting from SNF to ED 1/17 with [facility] and refusal to eat or take her antibiotics for pneumonia. ED admission day prior with diagnosis of pneumonia and received prescription for antibiotics. CT chest concerning for consolidation involving the RML and RLL with large effusion and multiple healing rib fractures. PMHx of COVID one month ago with hospital admission, dementia, cataracts, strokes in 2015 and 2016, HLD, mild carotid atherosclerotic disease by ultrasound, HTN, insomnia and persistent atrial fibrillation (on Xarelto).   Clinical Impression   PTA, pt recently receiving rehabilitation at SNF, but difficulty reporting level of function (dementia at baseline). Upon eval, pt presents with decreased sitting and standing balance due to R lateral lean, decreased R sided strength, general weakness, and decreased activity tolerance. Pt oriented to year provided two choices. Pt performing LB Adl with mod A and UB ADL with min A for sitting balance. Performing STS transfers and short distance mobility with RW with min A +2 this session. Recommending SNF for continued OT to optimize safety and independence in ADL.      Recommendations for follow up therapy are one component of a multi-disciplinary discharge planning process, led by the attending physician.  Recommendations may be updated based on patient status, additional functional criteria and insurance authorization.   Follow Up Recommendations  Skilled nursing-short term rehab (<3 hours/day)     Assistance Recommended at Discharge Frequent or constant Supervision/Assistance  Patient can return home with the following A lot of help with walking and/or transfers;A lot of help with bathing/dressing/bathroom;Assistance with cooking/housework;Direct supervision/assist for  medications management;Direct supervision/assist for financial management;Assist for transportation;Help with stairs or ramp for entrance    Functional Status Assessment  Patient has had a recent decline in their functional status and demonstrates the ability to make significant improvements in function in a reasonable and predictable amount of time.  Equipment Recommendations  Other (comment) (defer)    Recommendations for Other Services       Precautions / Restrictions Precautions Precautions: Fall Restrictions Weight Bearing Restrictions: No      Mobility Bed Mobility Overal bed mobility: Needs Assistance Bed Mobility: Rolling, Sidelying to Sit, Sit to Supine Rolling: Mod assist Sidelying to sit: Mod assist, +2 for physical assistance, +2 for safety/equipment, HOB elevated   Sit to supine: Mod assist, +2 for physical assistance, +2 for safety/equipment, HOB elevated   General bed mobility comments: Pt needing extra time and repeated cues to reach to either rail to roll, modA. ModAx2 to ascend trunk to sit up, minA to bring legs off EOB. ModAx2 to manage trunk and legs back to supine.    Transfers Overall transfer level: Needs assistance Equipment used: Rolling walker (2 wheels) Transfers: Sit to/from Stand Sit to Stand: Min assist, +2 physical assistance, +2 safety/equipment           General transfer comment: MinAx2 to transfer to stand, good initiation by pt, but once standing she displayed a posterior lean.      Balance Overall balance assessment: Needs assistance Sitting-balance support: Single extremity supported, No upper extremity supported, Bilateral upper extremity supported, Feet supported Sitting balance-Leahy Scale: Fair Sitting balance - Comments: Pt with R lateral and posterior lean, varying from needing min guard-minA for static sitting balance, repeatedly cuing pt to bring L shoulder to therapist on her L or progress from leaning on L elbow to  L hand  eventually placed on her lap while trying to maintain midline, fair momentary success noted. ModA for dynamic sitting balance. Postural control: Posterior lean, Right lateral lean Standing balance support: Bilateral upper extremity supported, During functional activity, Reliant on assistive device for balance Standing balance-Leahy Scale: Poor Standing balance comment: Reliant on RW and up to modAx2 for standing mobility, posterior lean noted.                           ADL either performed or assessed with clinical judgement   ADL Overall ADL's : Needs assistance/impaired Eating/Feeding: Set up;Bed level   Grooming: Wash/dry face;Min guard;Sitting   Upper Body Bathing: Sitting;Minimal assistance   Lower Body Bathing: Minimal assistance;+2 for safety/equipment;+2 for physical assistance;Sit to/from stand   Upper Body Dressing : Sitting;Minimal assistance   Lower Body Dressing: Minimal assistance;+2 for physical assistance;Sit to/from stand   Toilet Transfer: Minimal assistance;+2 for physical assistance;Rolling walker (2 wheels) (3 ft ambulation this session)           Functional mobility during ADLs: Minimal assistance;Rolling walker (2 wheels);Cueing for sequencing;Cueing for safety       Vision Patient Visual Report: No change from baseline Vision Assessment?: Vision impaired- to be further tested in functional context     Perception     Praxis      Pertinent Vitals/Pain Pain Assessment Pain Assessment: Faces Faces Pain Scale: Hurts little more Pain Location: R trunk Pain Descriptors / Indicators: Discomfort, Grimacing, Guarding, Sore Pain Intervention(s): Limited activity within patient's tolerance, Monitored during session     Hand Dominance     Extremity/Trunk Assessment Upper Extremity Assessment Upper Extremity Assessment: Generalized weakness;RUE deficits/detail RUE Deficits / Details: L MMT of 4- and R MMT of 3+   Lower Extremity  Assessment Lower Extremity Assessment: Defer to PT evaluation RLE Deficits / Details: MMT scores of 4 knee extension, 4+ ankle dorsiflexion; denied numbness/tingling bil LLE Deficits / Details: MMT scores of 4+ knee extension, 4- ankle dorsiflexion; denied numbness/tingling bil   Cervical / Trunk Assessment Cervical / Trunk Assessment: Kyphotic;Other exceptions Cervical / Trunk Exceptions: tends to laterally flex trunk to the R   Communication Communication Communication: No difficulties   Cognition Arousal/Alertness: Awake/alert Behavior During Therapy: Anxious Overall Cognitive Status: History of cognitive impairments - at baseline                                 General Comments: Has hx of dementia. Pt needing x2 options to choose correct year, able to guess the correct month. Pt follows simple commands with delay. Poor awareness of her tendency to lean posteriorly and to the R, needing repeated multi-modal cues to correct and improve her safety.     General Comments  vss    Exercises     Shoulder Instructions      Home Living Family/patient expects to be discharged to:: Skilled nursing facility                                 Additional Comments: Has been at [facility] since dc from admission on 08/15/22      Prior Functioning/Environment Prior Level of Function : History of Falls (last six months);Patient poor historian/Family not available             Mobility Comments: reports no use  of AD though fearful of falling ADLs Comments: reports able to complete ADLs though unsure of baseline due to poor historian        OT Problem List: Decreased strength;Impaired balance (sitting and/or standing);Decreased activity tolerance;Decreased cognition;Decreased safety awareness;Decreased knowledge of use of DME or AE      OT Treatment/Interventions: Self-care/ADL training;Therapeutic exercise;DME and/or AE instruction;Balance  training;Patient/family education;Cognitive remediation/compensation;Therapeutic activities    OT Goals(Current goals can be found in the care plan section) Acute Rehab OT Goals Patient Stated Goal: none stated OT Goal Formulation: With patient Time For Goal Achievement: 10/14/22 Potential to Achieve Goals: Fair  OT Frequency: Min 2X/week    Co-evaluation PT/OT/SLP Co-Evaluation/Treatment: Yes Reason for Co-Treatment: Necessary to address cognition/behavior during functional activity;For patient/therapist safety;To address functional/ADL transfers PT goals addressed during session: Mobility/safety with mobility OT goals addressed during session: ADL's and self-care      AM-PAC OT "6 Clicks" Daily Activity     Outcome Measure Help from another person eating meals?: A Little Help from another person taking care of personal grooming?: A Little Help from another person toileting, which includes using toliet, bedpan, or urinal?: A Lot Help from another person bathing (including washing, rinsing, drying)?: A Lot Help from another person to put on and taking off regular upper body clothing?: A Little Help from another person to put on and taking off regular lower body clothing?: A Lot 6 Click Score: 15   End of Session Equipment Utilized During Treatment: Gait belt;Rolling walker (2 wheels) Nurse Communication: Mobility status  Activity Tolerance: Patient tolerated treatment well Patient left: in bed;with bed alarm set;with call bell/phone within reach  OT Visit Diagnosis: Unsteadiness on feet (R26.81);Muscle weakness (generalized) (M62.81);Other abnormalities of gait and mobility (R26.89);Other symptoms and signs involving cognitive function                Time: 5830-9407 OT Time Calculation (min): 34 min Charges:  OT General Charges $OT Visit: 1 Visit OT Evaluation $OT Eval Moderate Complexity: 1 Mod  Elder Cyphers, OTR/L [facility] Acute Rehabilitation Office:  [phone]   Magnus Ivan 09/30/2022, 2:50 PM

## 2022-09-30 NOTE — Progress Notes (Signed)
Initial Nutrition Assessment  DOCUMENTATION CODES:   Not applicable  INTERVENTION:  Adjust diet to DYS 3 for easier chewing with missing teeth Automatic trays from dining services Nursing to assist with opening packages and tray set-up Boost Plus BID to provide 360kcal and 14g of protein per serving  NUTRITION DIAGNOSIS:   Inadequate oral intake related to decreased appetite as evidenced by per patient/family report.  GOAL:   Patient will meet greater than or equal to 90% of their needs  MONITOR:   PO intake, Supplement acceptance, Weight trends  REASON FOR ASSESSMENT:   Consult Poor PO  ASSESSMENT:   Pt with hx of HTN, HLD, hx CVA, hx endometrial cancer, advanced dementia, and CKD3 sent to ED by son after she was refusing to eat and take her antibiotic which she was prescribed the night before in the ED for pneumonia.     Noted pt has several admissions and ED visit in the last 3 months. Recently had COVID19 in December.   Weight loss of 12.9% weight loss noted in the last 3 months (10/28-1/17) which is severe and concerning in the context of advanced age. Suspect that pt meets criteria for malnutrition, but will confirm on follow-up assessment when physical exam can be performed.   Pt unable to provide a hx on the phone at this time. Per RN shift assessment, pt oriented x1 and also noted missing teeth. Pt currently with regular diet ordered, will adjust to DYS 3 for easier chewing, request automatic trays, and feeding assistance with tray set-up.   PMT working with family on goals of care. Currently full code with full scope of care.  Average Meal Intake: 1/19: 70% intake x 2 recorded meals  Nutritionally Relevant Medications: Scheduled Meds:  potassium chloride  40 mEq Oral Once   PRN Meds: ondansetron, senna-docusate  Labs Reviewed: K 3.4 Creatinine 1.14  NUTRITION - FOCUSED PHYSICAL EXAM: Defer to in-person assessment  Diet Order:   Diet Order              Diet regular Room service appropriate? Yes; Fluid consistency: Thin  Diet effective now                   EDUCATION NEEDS:   Not appropriate for education at this time  Skin:  Skin Assessment: Reviewed RN Assessment  Last BM:  1/19 - type 6  Height:   Ht Readings from Last 1 Encounters:  09/27/22 '5\' 8"'$  (1.727 m)    Weight:   Wt Readings from Last 1 Encounters:  09/27/22 60 kg    Ideal Body Weight:  63.6 kg  BMI:  Body mass index is 20.11 kg/m.  Estimated Nutritional Needs:   Kcal:  1500-1700 kcal/d  Protein:  70-90 g/d  Fluid:  >/=1.5 L/d    Ranell Patrick, RD, LDN Clinical Dietitian RD pager # available in AMION  After hours/weekend pager # available in [facility]

## 2022-09-30 NOTE — Telephone Encounter (Signed)
Post ED Visit - Positive Culture Follow-up  Culture report reviewed by antimicrobial stewardship pharmacist: [place] Team '[]'$  Elenor Quinones, Pharm.D. '[]'$  Heide Guile, Pharm.D., BCPS AQ-ID '[]'$  Parks Neptune, Pharm.D., BCPS '[]'$  Alycia Rossetti, Pharm.D., BCPS '[]'$  Eckhart Mines, Pharm.D., BCPS, AAHIVP '[]'$  Legrand Como, Pharm.D., BCPS, AAHIVP '[]'$  Salome Arnt, PharmD, BCPS '[]'$  Johnnette Gourd, PharmD, BCPS '[]'$  Hughes Better, PharmD, BCPS '[]'$  Leeroy Cha, PharmD '[]'$  Laqueta Linden, PharmD, BCPS '[x]'$  Lorelei Pont, PharmD  [place] Team '[]'$  Leodis Sias, PharmD '[]'$  Lindell Spar, PharmD '[]'$  Royetta Asal, PharmD '[]'$  Graylin Shiver, Rph '[]'$  Rema Fendt) Glennon Mac, PharmD '[]'$  Arlyn Dunning, PharmD '[]'$  Netta Cedars, PharmD '[]'$  Dia Sitter, PharmD '[]'$  Leone Haven, PharmD '[]'$  Gretta Arab, PharmD '[]'$  Theodis Shove, PharmD '[]'$  Peggyann Juba, PharmD '[]'$  Reuel Boom, PharmD   Positive blood culture  Pt admitted to [facility]. Provider notified and no further action needed Rosie Fate 09/30/2022, 1:16 PM

## 2022-09-30 NOTE — Progress Notes (Signed)
   Palliative Medicine Inpatient Follow Up Note HPI: Tiffany Black is a pleasant 89 y.o. female with medical history significant for dementia, anxiety, hypertension, PAF on Xarelto, COVID-19 infection 1 month ago, and history of CVA who presents to the emergency department with shortness of breath and refusal to eat or take her antibiotics.  Palliative care has been asked to address goals of care in the setting of precipitous decline.   Today's Discussion 09/30/2022  *Please note that this is a verbal dictation therefore any spelling or grammatical errors are due to the "[place] One" system interpretation.  Chart reviewed inclusive of vital signs, progress notes, laboratory results, and diagnostic images.   I met at bedside with Tiffany Black. She was not noted to be in any distress at the time of assessment. She had slept fairly well overnight.   I called patients son, Tiffany Black this afternoon. He shares that he is Created space and opportunity for patient to explore thoughts feelings and fears regarding current medical situation. Tiffany Black shares with me that he would like to know the disposition plan for his mother. She had gone to [facility] prior and done well. He is hopeful for the same this time if rehab is recommended.  Reviewed the idea of OP Palliative support on discharge which patients son is in agreement with for further [place] conversations.  Have emailed a copy of "Hard Choices" to [place] as well as a MOST form.  Questions and concerns addressed/Palliative Support Provided.   Objective Assessment: Vital Signs Vitals:   09/29/22 2051 09/30/22 0459  BP: (!) 155/92 (!) 156/102  Pulse: 100 83  Resp: 19 19  Temp: 98.6 F (37 C) 98.5 F (36.9 C)  SpO2: 95% 96%    Intake/Output Summary (Last 24 hours) at 09/30/2022 1145 Last data filed at 09/29/2022 1332 Gross per 24 hour  Intake 236 ml  Output --  Net 236 ml   Last Weight  Most recent update: 09/27/2022  6:17 PM    Weight   60 kg (132 lb 4.4 oz)            Gen: Frail elderly female in no acute distress HEENT: Dry mucous membranes CV: Regular rate and rhythm PULM: On room air breathing is even and unlabored ABD: soft/nontender EXT: No edema Neuro: Pleasantly disoriented  SUMMARY OF RECOMMENDATIONS   Full code/full scope of care --> encouraged patient son to consider DNR/DNI given patient's frail state and advanced dementia. Have emailed a hard choices booklet for review.   Continue current care-allow time for outcomes   Outpatient palliative care upon discharge   PMT will continue to incrementally follow  Billing based on MDM: High ______________________________________________________________________________________ [place] Team Team Cell Phone: [phone] Please utilize secure chat with additional questions, if there is no response within 30 minutes please call the above phone number  Palliative Medicine Team providers are available by phone from 7am to 7pm daily and can be reached through the team cell phone.  Should this patient require assistance outside of these hours, please call the patient's attending physician.

## 2022-10-01 ENCOUNTER — Inpatient Hospital Stay (HOSPITAL_COMMUNITY): Payer: Medicare Other

## 2022-10-01 DIAGNOSIS — R0602 Shortness of breath: Secondary | ICD-10-CM | POA: Diagnosis not present

## 2022-10-01 DIAGNOSIS — I480 Paroxysmal atrial fibrillation: Secondary | ICD-10-CM | POA: Diagnosis not present

## 2022-10-01 DIAGNOSIS — F03918 Unspecified dementia, unspecified severity, with other behavioral disturbance: Secondary | ICD-10-CM | POA: Diagnosis not present

## 2022-10-01 DIAGNOSIS — J90 Pleural effusion, not elsewhere classified: Secondary | ICD-10-CM | POA: Diagnosis not present

## 2022-10-01 DIAGNOSIS — Z515 Encounter for palliative care: Secondary | ICD-10-CM | POA: Diagnosis not present

## 2022-10-01 LAB — CULTURE, BLOOD (ROUTINE X 2)
Culture: NO GROWTH
Special Requests: ADEQUATE

## 2022-10-01 LAB — CHOLESTEROL, BODY FLUID: Cholesterol, Fluid: 68 mg/dL

## 2022-10-01 MED ORDER — LORAZEPAM 0.5 MG PO TABS: 0.5000 mg | ORAL_TABLET | Freq: Four times a day (QID) | ORAL | Status: DC | PRN

## 2022-10-01 NOTE — Progress Notes (Signed)
PROGRESS NOTE  Tiffany Black QQP:[id number] DOB: 1934/02/11   PCP: Ginger Organ., MD  Patient is from: SNF  DOA: 09/27/2022 LOS: 4  Chief complaints Chief Complaint  Patient presents with   Pneumonia     Brief Narrative / Interim history: 89 year old F with PMH of dementia, paroxysmal A-fib on Xarelto, CVA, anxiety recent COVID-19 infection about a month ago presenting with shortness of breath, refusal to eat or take her antibiotics for  pneumonia.  Seen in ED the day prior to presentation and diagnosed with pneumonia and discharged on oral antibiotics.   In ED, stable vitals.  No leukocytosis.  CT chest concerning for consolidation involving the RML and RLL with large effusion and multiple healing rib fractures.  She was started on CTX and azithromycin.  Underwent right thoracocentesis with removal of 900 mL dark red exudative fluid on 1/18.  Fluid cultures negative.  After further review of her chart and had thoracocentesis, it felt her pleural effusion is likely from hemothorax after she sustained a fall with rib fractures.  Antibiotics discontinued.  Remained stable off antibiotics.  Medically stable for discharge to SNF.   Subjective: Seen and examined earlier this morning.  No major events overnight of this morning.  No complaints.  Objective: Vitals:   09/30/22 0459 09/30/22 1214 09/30/22 2022 10/01/22 0347  BP: (!) 156/102 129/86 (!) 140/90 (!) 139/92  Pulse: 83 90 98 78  Resp: '19 19 18 16  '$ Temp: 98.5 F (36.9 C) 98.2 F (36.8 C) 98.4 F (36.9 C) 98.9 F (37.2 C)  TempSrc: Oral Oral Oral Oral  SpO2: 96% 95% 95% 94%  Weight:      Height:        Examination:  GENERAL: No apparent distress.  Nontoxic. HEENT: MMM.  Vision and hearing grossly intact.  NECK: Supple.  No apparent JVD.  RESP:  No IWOB.  Fair aeration bilaterally. CVS:  RRR. Heart sounds normal.  ABD/GI/GU: BS+. Abd soft, NTND.  MSK/EXT:  Moves extremities. No apparent deformity. No edema.   SKIN: no apparent skin lesion or wound NEURO: Awake and alert. Oriented to self, city, state and months.  She says 2023 for year.  No apparent focal neuro deficit. PSYCH: Calm. Normal affect.   Procedures:  1/18-IR thoracocentesis  Microbiology summarized: Full RVP nonreactive. Recent blood culture on 1/16 with Staph epidermidis in 1 out of 2 bottles likely contaminant. Pleural fluid culture NGTD.   Assessment and plan: Active Problems:   Pleural effusion on right   Paroxysmal atrial fibrillation with RVR (HCC)   Anxiety   Dementia with behavioral disturbance (HCC)   Essential hypertension   Lung nodule   Shortness of breath   Multiple fractures of ribs, right side, subsequent encounter for fracture with routine healing   Goals of care, counseling/discussion  Shortness of breath/right-sided pleural effusion: No fever, leukocytosis.  COVID-19, influenza, full RVP and Pro-Cal negative.  No oxygen requirement.  She has multiple right rib fractures with callus formation and right-sided pleural effusion that yielded 900 cc dark red exudative culture negative fluid suggesting traumatic event likely from her fall around Thanksgiving.  Patient is not reliable historian due to dementia. -Remains stable off antibiotics.  Pneumonia ruled out. -Incentive spirometry, OOB/PT/OT  Multiple healing right-sided rib fractures: Reportedly, patient had a ground-level mechanical fall before last hospitalization last month.   Positive blood culture: Staph epidermidis in 1 out of 2 bottles likely contaminant.  Patient has no signs of infection.  Persistent A-fib: Fairly  controlled heart rate. -Continue Xarelto and metoprolol  Dementia with behavioral disturbance/delirium: Waxing and waning mental status.  Oriented to self, place and month. -Reorientation and delirium precautions -Continue home Seroquel.   Anxiety: Stable. -Continue home Prozac and Seroquel   Essential hypertension: BP elevated  probably from not taking her medications. -Increased metoprolol to 50 mg twice daily on 1/19 -Increased Avapro to 300 mg daily on 1/20 -Continue felodipine   RUL nodule: Noted on CT. -Outpatient follow-up recommended   Hypokalemia: Resolved. -Monitor replenish as appropriate.  Goal of care counseling: Full code -See IPAL note -Palliative medicine on board.  Inadequate oral intake Body mass index is 20.11 kg/m. Nutrition Problem: Inadequate oral intake Etiology: decreased appetite Signs/Symptoms: per patient/family report Interventions: Refer to RD note for recommendations   DVT prophylaxis:   Rivaroxaban (XARELTO) tablet 15 mg  Code Status: Full code Family Communication: None at bedside. Level of care: Med-Surg Status is: Inpatient Remains inpatient appropriate because: SNF.  Medically stable for discharge.   Final disposition: SNF Consultants:  Pulmonology IR  35 minutes with more than 50% spent in reviewing records, counseling patient/family and coordinating care.   Sch Meds:  Scheduled Meds:  felodipine  10 mg Oral Daily   FLUoxetine  10 mg Oral Q1200   irbesartan  300 mg Oral Daily   lactose free nutrition  237 mL Oral BID BM   metoprolol tartrate  50 mg Oral BID   QUEtiapine  50 mg Oral QHS   Rivaroxaban  15 mg Oral Q supper   Continuous Infusions:   PRN Meds:.acetaminophen **OR** acetaminophen, LORazepam, ondansetron **OR** ondansetron (ZOFRAN) IV, senna-docusate  Antimicrobials: Anti-infectives (From admission, onward)    Start     Dose/Rate Route Frequency Ordered Stop   09/28/22 2200  azithromycin (ZITHROMAX) tablet 500 mg  Status:  Discontinued        500 mg Oral Daily 09/27/22 2143 09/29/22 1155   09/27/22 2200  cefTRIAXone (ROCEPHIN) 2 g in sodium chloride 0.9 % 100 mL IVPB  Status:  Discontinued        2 g 200 mL/hr over 30 Minutes Intravenous Every 24 hours 09/27/22 2143 09/29/22 1155   09/27/22 2115  cefTRIAXone (ROCEPHIN) 1 g in sodium  chloride 0.9 % 100 mL IVPB  Status:  Discontinued        1 g 200 mL/hr over 30 Minutes Intravenous  Once 09/27/22 2103 09/27/22 2149   09/27/22 2115  azithromycin (ZITHROMAX) 500 mg in sodium chloride 0.9 % 250 mL IVPB        500 mg 250 mL/hr over 60 Minutes Intravenous  Once 09/27/22 2103 09/27/22 2312        I have personally reviewed the following labs and images: CBC: Recent Labs  Lab 09/26/22 1554 09/28/22 0400 09/29/22 0626 09/30/22 0824  WBC 5.2 4.0 8.0 8.3  NEUTROABS 3.3  --   --   --   HGB 12.2 14.8 12.7 14.1  HCT 37.8 44.5 37.5 41.4  MCV 99.2 95.9 94.7 95.6  PLT 168 119* 194 187   BMP &GFR Recent Labs  Lab 09/26/22 1554 09/26/22 1925 09/28/22 0400 09/28/22 0626 09/29/22 0626 09/30/22 0824  NA 137 139 139  --  137 138  K 3.9 3.4* 4.5  --  3.4* 3.9  CL 103 113* 107  --  105 102  CO2 17* 15* 20*  --  23 23  GLUCOSE 88 89 89  --  99 87  BUN '11 9 10  '$ --  10 14  CREATININE 1.00 0.82 1.14*  --  1.14* 1.03*  CALCIUM 8.9 7.0* 8.9  --  8.9 9.4  MG  --   --   --  1.8  --  1.9  PHOS  --   --   --   --   --  3.3   Estimated Creatinine Clearance: 37.1 mL/min (A) (by C-G formula based on SCr of 1.03 mg/dL (H)). Liver & Pancreas: Recent Labs  Lab 09/26/22 1554 09/26/22 1925 09/28/22 0626 09/30/22 0824  AST '25 25 23  '$ --   ALT QUANTITY NOT SUFFICIENT, UNABLE TO PERFORM TEST 10 11  --   ALKPHOS 69 54 69  --   BILITOT QUANTITY NOT SUFFICIENT, UNABLE TO PERFORM TEST 1.0 0.5  --   PROT 5.6* 4.2* 5.7*  5.7*  --   ALBUMIN 3.2* 2.5* 3.2* 3.2*   No results for input(s): "LIPASE", "AMYLASE" in the last 168 hours. No results for input(s): "AMMONIA" in the last 168 hours. Diabetic: No results for input(s): "HGBA1C" in the last 72 hours. No results for input(s): "GLUCAP" in the last 168 hours. Cardiac Enzymes: No results for input(s): "CKTOTAL", "CKMB", "CKMBINDEX", "TROPONINI" in the last 168 hours. No results for input(s): "PROBNP" in the last 8760  hours. Coagulation Profile: No results for input(s): "INR", "PROTIME" in the last 168 hours. Thyroid Function Tests: No results for input(s): "TSH", "T4TOTAL", "FREET4", "T3FREE", "THYROIDAB" in the last 72 hours. Lipid Profile: No results for input(s): "CHOL", "HDL", "LDLCALC", "TRIG", "CHOLHDL", "LDLDIRECT" in the last 72 hours. Anemia Panel: No results for input(s): "VITAMINB12", "FOLATE", "FERRITIN", "TIBC", "IRON", "RETICCTPCT" in the last 72 hours. Urine analysis:    Component Value Date/Time   COLORURINE STRAW (A) 08/26/2022 2355   APPEARANCEUR CLEAR 08/26/2022 2355   LABSPEC 1.008 08/26/2022 2355   PHURINE 7.0 08/26/2022 2355   GLUCOSEU NEGATIVE 08/26/2022 2355   HGBUR NEGATIVE 08/26/2022 2355   BILIRUBINUR NEGATIVE 08/26/2022 [place] 08/26/2022 2355   PROTEINUR NEGATIVE 08/26/2022 2355   UROBILINOGEN 0.2 05/22/2015 1950   NITRITE NEGATIVE 08/26/2022 2355   LEUKOCYTESUR NEGATIVE 08/26/2022 2355   Sepsis Labs: Invalid input(s): "PROCALCITONIN", "LACTICIDVEN"  Microbiology: Recent Results (from the past 240 hour(s))  Blood culture (routine x 2)     Status: Abnormal   Collection Time: 09/26/22  6:00 PM   Specimen: BLOOD  Result Value Ref Range Status   Specimen Description BLOOD LEFT ANTECUBITAL  Final   Special Requests   Final    BOTTLES DRAWN AEROBIC AND ANAEROBIC Blood Culture results may not be optimal due to an inadequate volume of blood received in culture bottles   Culture  Setup Time   Final    GRAM POSITIVE COCCI IN CLUSTERS IN BOTH AEROBIC AND ANAEROBIC BOTTLES CRITICAL RESULT CALLED TO, READ BACK BY AND VERIFIED WITH: RN WHITNEY HICKS ON 09/27/22 @ 2138 BY DRT    Culture (A)  Final    STAPHYLOCOCCUS EPIDERMIDIS THE SIGNIFICANCE OF ISOLATING THIS ORGANISM FROM A SINGLE SET OF BLOOD CULTURES WHEN MULTIPLE SETS ARE DRAWN IS UNCERTAIN. PLEASE NOTIFY THE MICROBIOLOGY DEPARTMENT WITHIN ONE WEEK IF SPECIATION AND SENSITIVITIES ARE  REQUIRED. Performed at [facility], [place] [address] [place], [place] [address]    Report Status 09/29/2022 FINAL  Final  Blood culture (routine x 2)     Status: None   Collection Time: 09/26/22  6:00 PM   Specimen: BLOOD  Result Value Ref Range Status   Specimen Description BLOOD RIGHT  ANTECUBITAL  Final   Special Requests   Final    BOTTLES DRAWN AEROBIC AND ANAEROBIC Blood Culture adequate volume   Culture   Final    NO GROWTH 5 DAYS Performed at [facility], [address], [place], [place] [address]    Report Status 10/01/2022 FINAL  Final  Respiratory (~20 pathogens) panel by PCR     Status: None   Collection Time: 09/28/22  9:43 AM   Specimen: Nasopharyngeal Swab; Respiratory  Result Value Ref Range Status   Adenovirus NOT DETECTED NOT DETECTED Final   Coronavirus 229E NOT DETECTED NOT DETECTED Final    Comment: (NOTE) The Coronavirus on the Respiratory Panel, DOES NOT test for the novel  Coronavirus (2019 nCoV)    Coronavirus HKU1 NOT DETECTED NOT DETECTED Final   Coronavirus NL63 NOT DETECTED NOT DETECTED Final   Coronavirus OC43 NOT DETECTED NOT DETECTED Final   Metapneumovirus NOT DETECTED NOT DETECTED Final   Rhinovirus / Enterovirus NOT DETECTED NOT DETECTED Final   Influenza A NOT DETECTED NOT DETECTED Final   Influenza B NOT DETECTED NOT DETECTED Final   Parainfluenza Virus 1 NOT DETECTED NOT DETECTED Final   Parainfluenza Virus 2 NOT DETECTED NOT DETECTED Final   Parainfluenza Virus 3 NOT DETECTED NOT DETECTED Final   Parainfluenza Virus 4 NOT DETECTED NOT DETECTED Final   Respiratory Syncytial Virus NOT DETECTED NOT DETECTED Final   Bordetella pertussis NOT DETECTED NOT DETECTED Final   Bordetella Parapertussis NOT DETECTED NOT DETECTED Final   Chlamydophila pneumoniae NOT DETECTED NOT DETECTED Final   Mycoplasma pneumoniae NOT DETECTED NOT DETECTED Final    Comment: Performed at [facility], [place]. [address] [place], [place] [address]   Body fluid culture w Gram Stain     Status: None (Preliminary result)   Collection Time: 09/28/22  5:10 PM   Specimen: Lung, Right; Pleural Fluid  Result Value Ref Range Status   Specimen Description PLEURAL RIGHT  Final   Special Requests NONE  Final   Gram Stain   Final    FEW WBC PRESENT,BOTH PMN AND MONONUCLEAR NO ORGANISMS SEEN    Culture   Final    NO GROWTH 3 DAYS Performed at [facility], [place] [address] [place], [place] [address]    Report Status PENDING  Incomplete    Radiology Studies: No results found.    Dandrae Kustra T. Sheridan  If 7PM-7AM, please contact night-coverage [email or web address] 10/01/2022, 1:09 PM

## 2022-10-01 NOTE — Progress Notes (Signed)
   Palliative Medicine Inpatient Follow Up Note HPI: Tiffany Black is a pleasant 89 y.o. female with medical history significant for dementia, anxiety, hypertension, PAF on Xarelto, COVID-19 infection 1 month ago, and history of CVA who presents to the emergency department with shortness of breath and refusal to eat or take her antibiotics.  Palliative care has been asked to address goals of care in the setting of precipitous decline.   Today's Discussion 10/01/2022  *Please note that this is a verbal dictation therefore any spelling or grammatical errors are due to the "[place] One" system interpretation.  Chart reviewed inclusive of vital signs, progress notes, laboratory results, and diagnostic images.   POs have been about 40-60%.  I met at bedside with [name] this morning. She is resting comfortably and not noted to be in any distress. She is oriented to self.   I have called and spoken to patients son, Gerald Stabs this morning. He is in agreement with an OP Palliative consultation. We reviewed the MOST form and he has asked that one is left at bedside for his completion.   Plan for transition to SNF - ideally [facility].  Questions and concerns addressed/Palliative Support Provided.   Objective Assessment: Vital Signs Vitals:   09/30/22 2022 10/01/22 0347  BP: (!) 140/90 (!) 139/92  Pulse: 98 78  Resp: 18 16  Temp: 98.4 F (36.9 C) 98.9 F (37.2 C)  SpO2: 95% 94%    Intake/Output Summary (Last 24 hours) at 10/01/2022 1007 Last data filed at 09/30/2022 2022 Gross per 24 hour  Intake 840 ml  Output 1000 ml  Net -160 ml    Last Weight  Most recent update: 09/27/2022  6:17 PM    Weight  60 kg (132 lb 4.4 oz)            Gen: Frail elderly female in no acute distress HEENT: Dry mucous membranes CV: Regular rate and rhythm PULM: On room air breathing is even and unlabored ABD: soft/nontender EXT: No edema Neuro: Pleasantly disoriented  SUMMARY OF  RECOMMENDATIONS   Full code/full scope of care --> encouraged patient son to consider DNR/DNI given patient's frail state and advanced dementia. Have emailed a hard choices booklet for review and placed a MOST for at bedside.    Plan for transition to SNF with Outpatient palliative care upon discharge   PMT will continue to perform chart checks  Billing based on MDM: Moderate  ______________________________________________________________________________________ [place] Team Team Cell Phone: [phone] Please utilize secure chat with additional questions, if there is no response within 30 minutes please call the above phone number  Palliative Medicine Team providers are available by phone from 7am to 7pm daily and can be reached through the team cell phone.  Should this patient require assistance outside of these hours, please call the patient's attending physician.

## 2022-10-01 NOTE — Progress Notes (Signed)
Pt has pulled out iv, provider gives the okay to dc iv for now.. will replace iv ativan with po ativan

## 2022-10-01 NOTE — Progress Notes (Signed)
NAME:  Tiffany Black, MRN:  [id number], DOB:  1934/04/27, LOS: 4 ADMISSION DATE:  09/27/2022, CONSULTATION DATE:  09/28/22 REFERRING MD:  Reesa Chew CHIEF COMPLAINT:  Dyspnea   History of Present Illness:  Tiffany Black is a 89 y.o. female who has a PMH as below. She presented to [facility] ED 1/16 with dyspnea and fatigue. She had CXR that showed R pleural effusion and possible infiltrate. She was given a dose of Ceftriaxone and Azithromycin and discharged on Keflex and Azithromycin. She apparently had refused to take her abx given that she wasn't eating much.  On 1/17, she returned to ED with the same symptoms of mild dyspnea.  She had CT chest that demonstrated RML and RLL consolidation, right basilar atelectasis, and moderate right effusion. She also had incidental finding of 61m RUL nodule. Her WBC, lactic acid, and PCT were normal.  IR was consulted for thoracentesis. PCCM called 1/18 for RUL nodule and PNA. She is currently on room air. She is sleepy but opens eyes to voice and noxious stimuli  Pertinent  Medical History:  has Persistent atrial fibrillation ([place]; Abnormal EKG; PAC (premature atrial contraction); Vertigo; Sinusitis; Anxiety; Paroxysmal atrial fibrillation with RVR ([place]; Hypokalemia; History of stroke; Dizziness; Orthostatic hypotension; CVA (cerebral infarction); Chest pain; Essential hypertension; Headache syndrome; Atrial fibrillation with RVR ([place]; Aphasia; Acute encephalopathy; GAD (generalized anxiety disorder); TIA (transient ischemic attack); COVID-19 virus infection; Acute respiratory failure with hypoxia ([place]; Generalized weakness; Fall at home, initial encounter; Acute hyponatremia; Stage 3a chronic kidney disease (CKD) ([place]; Anemia of chronic disease; Pleural effusion on right; Dementia with behavioral disturbance ([place]; Lung nodule; Shortness of breath; Multiple fractures of ribs, right side, subsequent encounter for fracture with routine healing; and Goals of care,  counseling/discussion on their problem list.   Significant Hospital Events: Including procedures, antibiotic start and stop dates in addition to other pertinent events   1/17 admit  Interim History / Subjective:  Ms. JArchambaultdenies complaints.   Objective:  Blood pressure (!) 139/92, pulse 78, temperature 98.9 F (37.2 C), temperature source Oral, resp. rate 16, height '5\' 8"'$  (1.727 m), weight 60 kg, SpO2 94 %.        Intake/Output Summary (Last 24 hours) at 10/01/2022 1745 Last data filed at 10/01/2022 0830 Gross per 24 hour  Intake 840 ml  Output 400 ml  Net 440 ml    Filed Weights   09/27/22 1817  Weight: 60 kg    Examination: General: Chronically ill-appearing elderly woman lying in bed in NAD Neuro: Awake, alert, poor recall of previous hospital events. Not moving around much in bed. HEENT: [place]/AT, eyes anicteric. Cardiovascular: S1S2, RRR Lungs: Reduced R basilar breath sounds, CTA anteriorly. Breathing comfortably on RA. Abdomen: soft, NT Musculoskeletal:  no cyanosis or edema Skin: warm, no rashes  Pleural fluid: Culture-no growth to date Glucose 85 LDH 133 WBCs 412, 64% lymphocytes, 20% neutrophils, described as turbid appearing and red  RVP negative  CXR personally reviewed-likely residual pleural fluid in the right lower lobe.  Not significantly increased compared to 1/18 film.  Labs/imaging personally reviewed:  CT chest 1/17 > RML and RLL consolidation, right basilar atelectasis, and moderate right effusion.  471mRUL nodule.   Assessment & Plan:   Right pleural effusion - unclear etiology at this point. Worry about malignant cause lymphocyte predominance - Pathology pending - Will repeat ultrasound of the right pleural space tomorrow to see if she has had recurrence of fluid.  At this time I am enthusiastic about  resampling this while pathology is pending and she is on full dose anticoagulation.  If she has had recurrent pleural fluid, will have to  determine how quickly she is reaccumulating and what the next best strategy would be to manage this.  Hopefully this is mostly atelectasis -Pulmonary hygiene - Agree with holding antibiotics.  No evidence that this was a parapneumonic effusion  RUL nodule - incidental finding, 68m. -Follow-up CT in 1 year  Will follow-up tomorrow to see if she is reaccumulated pleural fluid on ultrasound.  Continue current management.   Best practice (evaluated daily):  Per primary team.  Labs   CBC: Recent Labs  Lab 09/26/22 1554 09/28/22 0400 09/29/22 0626 09/30/22 0824  WBC 5.2 4.0 8.0 8.3  NEUTROABS 3.3  --   --   --   HGB 12.2 14.8 12.7 14.1  HCT 37.8 44.5 37.5 41.4  MCV 99.2 95.9 94.7 95.6  PLT 168 119* 194 187     Basic Metabolic Panel: Recent Labs  Lab 09/26/22 1554 09/26/22 1925 09/28/22 0400 09/28/22 0626 09/29/22 0626 09/30/22 0824  NA 137 139 139  --  137 138  K 3.9 3.4* 4.5  --  3.4* 3.9  CL 103 113* 107  --  105 102  CO2 17* 15* 20*  --  23 23  GLUCOSE 88 89 89  --  99 87  BUN '11 9 10  '$ --  10 14  CREATININE 1.00 0.82 1.14*  --  1.14* 1.03*  CALCIUM 8.9 7.0* 8.9  --  8.9 9.4  MG  --   --   --  1.8  --  1.9  PHOS  --   --   --   --   --  3.3    GFR: Estimated Creatinine Clearance: 37.1 mL/min (A) (by C-G formula based on SCr of 1.03 mg/dL (H)). Recent Labs  Lab 09/26/22 1554 09/26/22 1800 09/26/22 1925 09/28/22 0400 09/28/22 0626 09/29/22 0626 09/30/22 0824  PROCALCITON  --   --   --   --  <0.10 <0.10  --   WBC 5.2  --   --  4.0  --  8.0 8.3  LATICACIDVEN  --  1.8 1.2  --   --   --   --      LJulian Hy DO 10/01/22 6:55 PM [facility]  For contact information, see Amion. If no response to pager, please call PCCM consult pager. After hours, 7PM- 7AM, please call Elink.

## 2022-10-01 NOTE — Progress Notes (Signed)
Spoke to pt's son Gerald Stabs re PT/OT recommendation for SNF. Pt's son reports agreeable to SNF placement. Reviewed SNF placement process and answered questions. Pt with prior stay at [facility] and pt's son reports she did well at this facility but is open to other facilities as well.   [place] auth request submitted (reference # [id number]), will need to update them with SNF choice when known.   Wandra Feinstein, MSW, LCSW [phone] (coverage)

## 2022-10-01 NOTE — Plan of Care (Signed)

## 2022-10-01 NOTE — NC FL2 (Signed)
[place] LEVEL OF CARE FORM     IDENTIFICATION  Patient Name: Tiffany Black Birthdate: [birth year] Sex: female Admission Date (Current Location): 09/27/2022  [facility] and [place] Number:  Herbalist and Address:  [facility], [place] [address] [place], [place] [address]      Provider Number: [id number]  Attending Physician Name and Address:  Mercy Riding, MD  Relative Name and Phone Number:       Current Level of Care: Hospital Recommended Level of Care: [place] Prior Approval Number:    Date Approved/Denied:   PASRR Number: [id number]  Discharge Plan: SNF    Current Diagnoses: Patient Active Problem List   Diagnosis Date Noted   Shortness of breath 09/29/2022   Multiple fractures of ribs, right side, subsequent encounter for fracture with routine healing 09/29/2022   Goals of care, counseling/discussion 09/29/2022   Pleural effusion on right 09/27/2022   Dementia with behavioral disturbance ([place]) 09/27/2022   Lung nodule 09/27/2022   COVID-19 virus infection 08/27/2022   Acute respiratory failure with hypoxia ([place]) 08/27/2022   Generalized weakness 08/27/2022   Fall at home, initial encounter 08/27/2022   Acute hyponatremia 08/27/2022   Stage 3a chronic kidney disease (CKD) ([place]) 08/27/2022   Anemia of chronic disease 08/27/2022   Acute encephalopathy 08/10/2022   GAD (generalized anxiety disorder) 08/10/2022   TIA (transient ischemic attack) 08/10/2022   Aphasia 08/09/2022   Atrial fibrillation with RVR ([place]) 09/07/2021   Headache syndrome 07/02/2017   Chest pain 05/23/2015   Essential hypertension    History of stroke 05/10/2015   Dizziness 05/10/2015   Orthostatic hypotension 05/10/2015   CVA (cerebral infarction) 05/10/2015   Hypokalemia 09/30/2013   Vertigo 02/28/2013   Sinusitis 02/28/2013   Anxiety 02/28/2013   Paroxysmal atrial fibrillation with RVR (HCC) 02/28/2013   PAC (premature  atrial contraction) 04/10/2012   Abnormal EKG 01/19/2012   Persistent atrial fibrillation ([place]) 02/15/2010    Orientation RESPIRATION BLADDER Height & Weight     Time, Place (fluctuating orientation)  Normal Incontinent Weight: 132 lb 4.4 oz (60 kg) Height:  '5\' 8"'$  (172.7 cm)  BEHAVIORAL SYMPTOMS/MOOD NEUROLOGICAL BOWEL NUTRITION STATUS      Incontinent    AMBULATORY STATUS COMMUNICATION OF NEEDS Skin   Extensive Assist Verbally                         Personal Care Assistance Level of Assistance  Bathing, Dressing, Feeding Bathing Assistance: Limited assistance Feeding assistance: Limited assistance Dressing Assistance: Limited assistance     Functional Limitations Info  Sight, Hearing, Speech Sight Info: Adequate Hearing Info: Adequate Speech Info: Adequate    SPECIAL CARE FACTORS FREQUENCY  PT (By licensed PT), OT (By licensed OT)                    Contractures Contractures Info: Not present    Additional Factors Info  Code Status Code Status Info: FULL CODE             Current Medications (10/01/2022):  This is the current hospital active medication list Current Facility-Administered Medications  Medication Dose Route Frequency Provider Last Rate Last Admin   acetaminophen (TYLENOL) tablet 650 mg  650 mg Oral Q6H PRN Opyd, Ilene Qua, MD       Or   acetaminophen (TYLENOL) suppository 650 mg  650 mg Rectal Q6H PRN Opyd, Ilene Qua, MD  felodipine (PLENDIL) 24 hr tablet 10 mg  10 mg Oral Daily Opyd, Ilene Qua, MD   10 mg at 09/30/22 1001   FLUoxetine (PROZAC) capsule 10 mg  10 mg Oral Q1200 Opyd, Ilene Qua, MD   10 mg at 09/30/22 1120   irbesartan (AVAPRO) tablet 300 mg  300 mg Oral Daily Wendee Beavers T, MD   300 mg at 09/30/22 1003   lactose free nutrition (BOOST PLUS) liquid 237 mL  237 mL Oral BID BM Wendee Beavers T, MD       LORazepam (ATIVAN) injection 0.5 mg  0.5 mg Intravenous Q6H PRN Etta Quill, DO   0.5 mg at 09/29/22 2145    metoprolol tartrate (LOPRESSOR) tablet 50 mg  50 mg Oral BID Wendee Beavers T, MD   50 mg at 09/30/22 2030   ondansetron (ZOFRAN) tablet 4 mg  4 mg Oral Q6H PRN Opyd, Ilene Qua, MD       Or   ondansetron (ZOFRAN) injection 4 mg  4 mg Intravenous Q6H PRN Opyd, Ilene Qua, MD       QUEtiapine (SEROQUEL) tablet 50 mg  50 mg Oral QHS Opyd, Ilene Qua, MD   50 mg at 09/30/22 2030   Rivaroxaban (XARELTO) tablet 15 mg  15 mg Oral Q supper Lorella Nimrod, MD   15 mg at 09/30/22 1723   senna-docusate (Senokot-S) tablet 1 tablet  1 tablet Oral QHS PRN Opyd, Ilene Qua, MD         Discharge Medications: Please see discharge summary for a list of discharge medications.  Relevant Imaging Results:  Relevant Lab Results:   Additional Information SSN [place], [place], [place]

## 2022-10-02 ENCOUNTER — Inpatient Hospital Stay (HOSPITAL_COMMUNITY): Payer: Medicare Other

## 2022-10-02 DIAGNOSIS — M25431 Effusion, right wrist: Secondary | ICD-10-CM | POA: Insufficient documentation

## 2022-10-02 DIAGNOSIS — I480 Paroxysmal atrial fibrillation: Secondary | ICD-10-CM | POA: Diagnosis not present

## 2022-10-02 DIAGNOSIS — F03918 Unspecified dementia, unspecified severity, with other behavioral disturbance: Secondary | ICD-10-CM | POA: Diagnosis not present

## 2022-10-02 DIAGNOSIS — J90 Pleural effusion, not elsewhere classified: Secondary | ICD-10-CM | POA: Diagnosis not present

## 2022-10-02 DIAGNOSIS — M25531 Pain in right wrist: Secondary | ICD-10-CM

## 2022-10-02 DIAGNOSIS — R0602 Shortness of breath: Secondary | ICD-10-CM | POA: Diagnosis not present

## 2022-10-02 LAB — BODY FLUID CULTURE W GRAM STAIN: Culture: NO GROWTH

## 2022-10-02 LAB — CYTOLOGY - NON PAP

## 2022-10-02 MED ORDER — DICLOFENAC SODIUM 1 % EX GEL
2.0000 g | Freq: Four times a day (QID) | CUTANEOUS | Status: DC
  Administered 2022-10-02 – 2022-10-03 (×5): 2 g via TOPICAL
  Filled 2022-10-02: qty 100

## 2022-10-02 MED ORDER — ACETAMINOPHEN 325 MG PO TABS
650.0000 mg | ORAL_TABLET | Freq: Four times a day (QID) | ORAL | Status: DC
  Administered 2022-10-02 – 2022-10-03 (×3): 650 mg via ORAL
  Filled 2022-10-02 (×4): qty 2

## 2022-10-02 MED ORDER — ACETAMINOPHEN 500 MG PO TABS: ORAL_TABLET | ORAL | Status: AC

## 2022-10-02 MED ORDER — SENNOSIDES-DOCUSATE SODIUM 8.6-50 MG PO TABS: 1.0000 | ORAL_TABLET | Freq: Every evening | ORAL | Status: DC | PRN

## 2022-10-02 MED ORDER — VALSARTAN 320 MG PO TABS: 320.0000 mg | ORAL_TABLET | Freq: Every day | ORAL | Status: DC

## 2022-10-02 MED ORDER — DICLOFENAC SODIUM 1 % EX GEL: 2.0000 g | Freq: Four times a day (QID) | CUTANEOUS | Status: DC

## 2022-10-02 NOTE — Care Management Important Message (Signed)
Important Message  Patient Details  Name: Tiffany Black MRN: [id number] Date of Birth: 01-Jan-1934   Medicare Important Message Given:  Yes     Thurman Sarver [address] 10/02/2022, 2:56 PM

## 2022-10-02 NOTE — Progress Notes (Addendum)
CSW spoke with patient's son who states he wants to accept bed offer from [facility].  Patient's insurance authorization has been approved. CSW contacted [employer] to inform them of facility choice.  Madilyn Fireman, MSW, LCSW Transitions of Care  Clinical Social Worker II [phone]

## 2022-10-02 NOTE — Progress Notes (Signed)
NAME:  Tiffany Black, MRN:  [id number], DOB:  02/11/1934, LOS: 5 ADMISSION DATE:  09/27/2022, CONSULTATION DATE:  09/28/22 REFERRING MD:  Reesa Chew CHIEF COMPLAINT:  Dyspnea   History of Present Illness:  Tiffany Black is a 89 y.o. female who has a PMH as below. She presented to [facility] ED 1/16 with dyspnea and fatigue. She had CXR that showed R pleural effusion and possible infiltrate. She was given a dose of Ceftriaxone and Azithromycin and discharged on Keflex and Azithromycin. She apparently had refused to take her abx given that she wasn't eating much.  On 1/17, she returned to ED with the same symptoms of mild dyspnea.  She had CT chest that demonstrated RML and RLL consolidation, right basilar atelectasis, and moderate right effusion. She also had incidental finding of 59m RUL nodule. Her WBC, lactic acid, and PCT were normal.  IR was consulted for thoracentesis. PCCM called 1/18 for RUL nodule and PNA. She is currently on room air. She is sleepy but opens eyes to voice and noxious stimuli  Pertinent  Medical History:  has Persistent atrial fibrillation ([place]; Abnormal EKG; PAC (premature atrial contraction); Vertigo; Sinusitis; Anxiety; Paroxysmal atrial fibrillation with RVR ([place]; Hypokalemia; History of stroke; Dizziness; Orthostatic hypotension; CVA (cerebral infarction); Chest pain; Essential hypertension; Headache syndrome; Atrial fibrillation with RVR ([place]; Aphasia; Acute encephalopathy; GAD (generalized anxiety disorder); TIA (transient ischemic attack); COVID-19 virus infection; Acute respiratory failure with hypoxia ([place]; Generalized weakness; Fall at home, initial encounter; Acute hyponatremia; Stage 3a chronic kidney disease (CKD) ([place]; Anemia of chronic disease; Pleural effusion on right; Dementia with behavioral disturbance (HL[place]; Lung nodule; Shortness of breath; Multiple fractures of ribs, right side, subsequent encounter for fracture with routine healing; Goals of care,  counseling/discussion; and Pain and swelling of right wrist on their problem list.   Significant Hospital Events: Including procedures, antibiotic start and stop dates in addition to other pertinent events   1/17 admit  Interim History / Subjective:  Ms. JHerlingdenies com  Objective:  Blood pressure (!) 141/75, pulse 78, temperature 98.1 F (36.7 C), temperature source Oral, resp. rate 16, height '5\' 8"'$  (1.727 m), weight 60 kg, SpO2 94 %.        Intake/Output Summary (Last 24 hours) at 10/02/2022 1403 Last data filed at 10/02/2022 0830 Gross per 24 hour  Intake 720 ml  Output 50 ml  Net 670 ml    Filed Weights   09/27/22 1817  Weight: 60 kg    Examination: General: frail appearing elderly woman lying in bed in NADpla Neuro: Awake, eating lunch. No focal deficits. HEENT: [place]/AT, eyes anicteric Cardiovascular: S1S2, irreg rhythm, reg rate Lungs: reduced R basilar breath sounds, CTA anteriorly, no tachypnea. Abdomen: soft, NT Musculoskeletal:  no cyanosis Skin: warm, dry  Pleural fluid: Culture- NGTD Cyto  pending Glucose 85 LDH 133 WBCs 412, 64% lymphocytes, 20% neutrophils, described as turbid appearing and red  RVP negative   Assessment & Plan:   Right pleural effusion - unclear etiology at this point. Worry about malignant cause with lymphocyte predominance and serosanguinous fluid. - Cytology is pending -cultures remain NGTD; fluid analysis not suggestive of infection -UKoreademonstrates some dependent fluid in her thorax. Not a large collection and does not appear to be compromising her respiratory status currently. Recommend repeat CXR in 1 week or sooner if she develops new or worse respiratory symptoms. If she reaccumulates a significant amount of fluid, recommend holding her DOAC and setting up an OP thora. If this occurs, she  would need to repeat cytology as the yield is ~60% on one specimen and increases with a second specimen.  -Pulmonary hygiene  RUL  nodule - incidental finding, 28m. -Follow up CT scan in 1 year  Planning for discharge to SNF tomorrow.    Best practice (evaluated daily):  Per primary team.  Labs   CBC: Recent Labs  Lab 09/26/22 1554 09/28/22 0400 09/29/22 0626 09/30/22 0824  WBC 5.2 4.0 8.0 8.3  NEUTROABS 3.3  --   --   --   HGB 12.2 14.8 12.7 14.1  HCT 37.8 44.5 37.5 41.4  MCV 99.2 95.9 94.7 95.6  PLT 168 119* 194 187     Basic Metabolic Panel: Recent Labs  Lab 09/26/22 1554 09/26/22 1925 09/28/22 0400 09/28/22 0626 09/29/22 0626 09/30/22 0824  NA 137 139 139  --  137 138  K 3.9 3.4* 4.5  --  3.4* 3.9  CL 103 113* 107  --  105 102  CO2 17* 15* 20*  --  23 23  GLUCOSE 88 89 89  --  99 87  BUN '11 9 10  '$ --  10 14  CREATININE 1.00 0.82 1.14*  --  1.14* 1.03*  CALCIUM 8.9 7.0* 8.9  --  8.9 9.4  MG  --   --   --  1.8  --  1.9  PHOS  --   --   --   --   --  3.3    GFR: Estimated Creatinine Clearance: 37.1 mL/min (A) (by C-G formula based on SCr of 1.03 mg/dL (H)). Recent Labs  Lab 09/26/22 1554 09/26/22 1800 09/26/22 1925 09/28/22 0400 09/28/22 0626 09/29/22 0626 09/30/22 0824  PROCALCITON  --   --   --   --  <0.10 <0.10  --   WBC 5.2  --   --  4.0  --  8.0 8.3  LATICACIDVEN  --  1.8 1.2  --   --   --   --      LJulian Hy DO 10/02/22 2:14 PM [facility]  For contact information, see Amion. If no response to pager, please call PCCM consult pager. After hours, 7PM- 7AM, please call Elink.

## 2022-10-02 NOTE — Progress Notes (Signed)
PROGRESS NOTE  Tiffany Black DSK:[id number] DOB: September 26, 1933   PCP: Tiffany Black., MD  Patient is from: SNF  DOA: 09/27/2022 LOS: 5  Chief complaints Chief Complaint  Patient presents with   Pneumonia     Brief Narrative / Interim history: 89 year old F with PMH of dementia, paroxysmal A-fib on Xarelto, CVA, anxiety recent COVID-19 infection about a month ago presenting with shortness of breath, refusal to eat or take her antibiotics for  pneumonia.  Seen in ED the day prior to presentation and diagnosed with pneumonia and discharged on oral antibiotics.   In ED, stable vitals.  No leukocytosis.  CT chest concerning for consolidation involving the RML and RLL with large effusion and multiple healing rib fractures.  She was started on CTX and azithromycin.  Underwent right thoracocentesis with removal of 900 mL dark red exudative fluid on 1/18.  Fluid cultures negative.  Cytology pending.  Pulmonology following.  After further review of her chart and had thoracocentesis, it felt her pleural effusion is likely from hemothorax after she sustained a fall with rib fractures.  Antibiotics discontinued.  Remained stable off antibiotics.  Medically stable for discharge to SNF.   Subjective: Seen and examined earlier this morning.  No major events overnight of this morning.  Patient's son at bedside.  Patient has no complaints other than right wrist pain.   Objective: Vitals:   10/01/22 1633 10/01/22 1923 10/02/22 0515 10/02/22 0833  BP: 119/88 137/83 (!) 131/91 (!) 141/75  Pulse: (!) 105 (!) 102 81 78  Resp: '16 15 17 16  '$ Temp: (!) 97.3 F (36.3 C) 98.7 F (37.1 C) 97.8 F (36.6 C) 98.1 F (36.7 C)  TempSrc: Oral Oral Oral Oral  SpO2: 95% 96% 96% 94%  Weight:      Height:        Examination:  GENERAL: No apparent distress.  Nontoxic. HEENT: MMM.  Vision and hearing grossly intact.  NECK: Supple.  No apparent JVD.  RESP:  No IWOB.  Fair aeration bilaterally. CVS:  RRR.  Heart sounds normal.  ABD/GI/GU: BS+. Abd soft, NTND.  MSK/EXT: Mild right wrist swelling dorsally.  Pain with range of motion.  Some tenderness to palpation.  No apparent erythema. SKIN: no apparent skin lesion or wound NEURO: Awake and alert. Oriented to self, person, place and month.  No apparent focal neuro deficit. PSYCH: Calm. Normal affect.   Procedures:  1/18-IR thoracocentesis  Microbiology summarized: Full RVP nonreactive. Recent blood culture on 1/16 with Staph epidermidis in 1 out of 2 bottles likely contaminant. Pleural fluid culture NGTD.   Assessment and plan: Active Problems:   Pleural effusion on right   Paroxysmal atrial fibrillation with RVR (HCC)   Anxiety   Dementia with behavioral disturbance (HCC)   Essential hypertension   Lung nodule   Shortness of breath   Multiple fractures of ribs, right side, subsequent encounter for fracture with routine healing   Goals of care, counseling/discussion  Shortness of breath/right-sided pleural effusion: No fever, leukocytosis.  COVID-19, influenza, full RVP and Pro-Cal negative.  No oxygen requirement.  She has multiple right rib fractures with callus formation and right-sided pleural effusion that yielded 900 cc dark red exudative culture negative fluid suggesting traumatic event likely from her fall around Thanksgiving.  Patient is not reliable historian due to dementia. -Remains stable off antibiotics.  Pneumonia ruled out. -Incentive spirometry, OOB/PT/OT -Follow pleural fluid cytology -Pulmonology following.  Multiple healing right-sided rib fractures: Reportedly, patient had a ground-level mechanical  fall before last hospitalization last month.   Positive blood culture: Staph epidermidis in 1 out of 2 bottles likely contaminant.  Patient has no signs of infection.  Right wrist pain: X-ray without acute finding but old/healed fractures involving the base of the fourth and fifth metacarpals as well as the ulnar  styloid process, degenerative changes and chondrocalcinosis. -Pain control with scheduled Tylenol and Voltaren gel  Persistent A-fib: Fairly controlled heart rate. -Continue Xarelto and metoprolol  Dementia with behavioral disturbance/delirium: Waxing and waning mental status.  Oriented to self, person, place and month. -Reorientation and delirium precautions -Continue home Seroquel.   Anxiety: Stable. -Continue home Prozac and Seroquel   Essential hypertension: BP improved after adjusting meds. -Increased metoprolol to 50 mg twice daily on 1/19 -Increased Avapro to 300 mg daily on 1/20 -Continue felodipine   RUL nodule: Noted on CT. -Outpatient follow-up recommended   Hypokalemia: Resolved. -Monitor replenish as appropriate.  Goal of care counseling: Full code -See IPAL note -Palliative medicine on board.  Inadequate oral intake Body mass index is 20.11 kg/m. Nutrition Problem: Inadequate oral intake Etiology: decreased appetite Signs/Symptoms: per patient/family report Interventions: Refer to RD note for recommendations   DVT prophylaxis:   Rivaroxaban (XARELTO) tablet 15 mg  Code Status: Full code Family Communication: None at bedside. Level of care: Med-Surg Status is: Inpatient Remains inpatient appropriate because: Right-sided pleural effusion   Final disposition: SNF once cleared by pulmonology Consultants:  Pulmonology IR  35 minutes with more than 50% spent in reviewing records, counseling patient/family and coordinating care.   Sch Meds:  Scheduled Meds:  acetaminophen  650 mg Oral Q6H WA   diclofenac Sodium  2 g Topical QID   felodipine  10 mg Oral Daily   FLUoxetine  10 mg Oral Q1200   irbesartan  300 mg Oral Daily   lactose free nutrition  237 mL Oral BID BM   metoprolol tartrate  50 mg Oral BID   QUEtiapine  50 mg Oral QHS   Rivaroxaban  15 mg Oral Q supper   Continuous Infusions:   PRN Meds:.LORazepam, LORazepam, ondansetron **OR**  ondansetron (ZOFRAN) IV, senna-docusate  Antimicrobials: Anti-infectives (From admission, onward)    Start     Dose/Rate Route Frequency Ordered Stop   09/28/22 2200  azithromycin (ZITHROMAX) tablet 500 mg  Status:  Discontinued        500 mg Oral Daily 09/27/22 2143 09/29/22 1155   09/27/22 2200  cefTRIAXone (ROCEPHIN) 2 g in sodium chloride 0.9 % 100 mL IVPB  Status:  Discontinued        2 g 200 mL/hr over 30 Minutes Intravenous Every 24 hours 09/27/22 2143 09/29/22 1155   09/27/22 2115  cefTRIAXone (ROCEPHIN) 1 g in sodium chloride 0.9 % 100 mL IVPB  Status:  Discontinued        1 g 200 mL/hr over 30 Minutes Intravenous  Once 09/27/22 2103 09/27/22 2149   09/27/22 2115  azithromycin (ZITHROMAX) 500 mg in sodium chloride 0.9 % 250 mL IVPB        500 mg 250 mL/hr over 60 Minutes Intravenous  Once 09/27/22 2103 09/27/22 2312        I have personally reviewed the following labs and images: CBC: Recent Labs  Lab 09/26/22 1554 09/28/22 0400 09/29/22 0626 09/30/22 0824  WBC 5.2 4.0 8.0 8.3  NEUTROABS 3.3  --   --   --   HGB 12.2 14.8 12.7 14.1  HCT 37.8 44.5 37.5 41.4  MCV 99.2  95.9 94.7 95.6  PLT 168 119* 194 187   BMP &GFR Recent Labs  Lab 09/26/22 1554 09/26/22 1925 09/28/22 0400 09/28/22 0626 09/29/22 0626 09/30/22 0824  NA 137 139 139  --  137 138  K 3.9 3.4* 4.5  --  3.4* 3.9  CL 103 113* 107  --  105 102  CO2 17* 15* 20*  --  23 23  GLUCOSE 88 89 89  --  99 87  BUN '11 9 10  '$ --  10 14  CREATININE 1.00 0.82 1.14*  --  1.14* 1.03*  CALCIUM 8.9 7.0* 8.9  --  8.9 9.4  MG  --   --   --  1.8  --  1.9  PHOS  --   --   --   --   --  3.3   Estimated Creatinine Clearance: 37.1 mL/min (A) (by C-G formula based on SCr of 1.03 mg/dL (H)). Liver & Pancreas: Recent Labs  Lab 09/26/22 1554 09/26/22 1925 09/28/22 0626 09/30/22 0824  AST '25 25 23  '$ --   ALT QUANTITY NOT SUFFICIENT, UNABLE TO PERFORM TEST 10 11  --   ALKPHOS 69 54 69  --   BILITOT QUANTITY NOT  SUFFICIENT, UNABLE TO PERFORM TEST 1.0 0.5  --   PROT 5.6* 4.2* 5.7*  5.7*  --   ALBUMIN 3.2* 2.5* 3.2* 3.2*   No results for input(s): "LIPASE", "AMYLASE" in the last 168 hours. No results for input(s): "AMMONIA" in the last 168 hours. Diabetic: No results for input(s): "HGBA1C" in the last 72 hours. No results for input(s): "GLUCAP" in the last 168 hours. Cardiac Enzymes: No results for input(s): "CKTOTAL", "CKMB", "CKMBINDEX", "TROPONINI" in the last 168 hours. No results for input(s): "PROBNP" in the last 8760 hours. Coagulation Profile: No results for input(s): "INR", "PROTIME" in the last 168 hours. Thyroid Function Tests: No results for input(s): "TSH", "T4TOTAL", "FREET4", "T3FREE", "THYROIDAB" in the last 72 hours. Lipid Profile: No results for input(s): "CHOL", "HDL", "LDLCALC", "TRIG", "CHOLHDL", "LDLDIRECT" in the last 72 hours. Anemia Panel: No results for input(s): "VITAMINB12", "FOLATE", "FERRITIN", "TIBC", "IRON", "RETICCTPCT" in the last 72 hours. Urine analysis:    Component Value Date/Time   COLORURINE STRAW (A) 08/26/2022 2355   APPEARANCEUR CLEAR 08/26/2022 2355   LABSPEC 1.008 08/26/2022 2355   PHURINE 7.0 08/26/2022 2355   GLUCOSEU NEGATIVE 08/26/2022 2355   HGBUR NEGATIVE 08/26/2022 2355   BILIRUBINUR NEGATIVE 08/26/2022 [place] 08/26/2022 2355   PROTEINUR NEGATIVE 08/26/2022 2355   UROBILINOGEN 0.2 05/22/2015 1950   NITRITE NEGATIVE 08/26/2022 2355   LEUKOCYTESUR NEGATIVE 08/26/2022 2355   Sepsis Labs: Invalid input(s): "PROCALCITONIN", "LACTICIDVEN"  Microbiology: Recent Results (from the past 240 hour(s))  Blood culture (routine x 2)     Status: Abnormal   Collection Time: 09/26/22  6:00 PM   Specimen: BLOOD  Result Value Ref Range Status   Specimen Description BLOOD LEFT ANTECUBITAL  Final   Special Requests   Final    BOTTLES DRAWN AEROBIC AND ANAEROBIC Blood Culture results may not be optimal due to an inadequate volume  of blood received in culture bottles   Culture  Setup Time   Final    GRAM POSITIVE COCCI IN CLUSTERS IN BOTH AEROBIC AND ANAEROBIC BOTTLES CRITICAL RESULT CALLED TO, READ BACK BY AND VERIFIED WITH: RN Schlusser ON 09/27/22 @ 2138 BY DRT    Culture (A)  Final    STAPHYLOCOCCUS EPIDERMIDIS THE SIGNIFICANCE OF ISOLATING THIS  ORGANISM FROM A SINGLE SET OF BLOOD CULTURES WHEN MULTIPLE SETS ARE DRAWN IS UNCERTAIN. PLEASE NOTIFY THE MICROBIOLOGY DEPARTMENT WITHIN ONE WEEK IF SPECIATION AND SENSITIVITIES ARE REQUIRED. Performed at [facility], [place] [address] [place], [place] [address]    Report Status 09/29/2022 FINAL  Final  Blood culture (routine x 2)     Status: None   Collection Time: 09/26/22  6:00 PM   Specimen: BLOOD  Result Value Ref Range Status   Specimen Description BLOOD RIGHT ANTECUBITAL  Final   Special Requests   Final    BOTTLES DRAWN AEROBIC AND ANAEROBIC Blood Culture adequate volume   Culture   Final    NO GROWTH 5 DAYS Performed at [name], [place] [address], [place], [place] [address]    Report Status 10/01/2022 FINAL  Final  Respiratory (~20 pathogens) panel by PCR     Status: None   Collection Time: 09/28/22  9:43 AM   Specimen: Nasopharyngeal Swab; Respiratory  Result Value Ref Range Status   Adenovirus NOT DETECTED NOT DETECTED Final   Coronavirus 229E NOT DETECTED NOT DETECTED Final    Comment: (NOTE) The Coronavirus on the Respiratory Panel, DOES NOT test for the novel  Coronavirus (2019 nCoV)    Coronavirus HKU1 NOT DETECTED NOT DETECTED Final   Coronavirus NL63 NOT DETECTED NOT DETECTED Final   Coronavirus OC43 NOT DETECTED NOT DETECTED Final   Metapneumovirus NOT DETECTED NOT DETECTED Final   Rhinovirus / Enterovirus NOT DETECTED NOT DETECTED Final   Influenza A NOT DETECTED NOT DETECTED Final   Influenza B NOT DETECTED NOT DETECTED Final   Parainfluenza Virus 1 NOT DETECTED NOT DETECTED Final   Parainfluenza Virus 2 NOT DETECTED NOT  DETECTED Final   Parainfluenza Virus 3 NOT DETECTED NOT DETECTED Final   Parainfluenza Virus 4 NOT DETECTED NOT DETECTED Final   Respiratory Syncytial Virus NOT DETECTED NOT DETECTED Final   Bordetella pertussis NOT DETECTED NOT DETECTED Final   Bordetella Parapertussis NOT DETECTED NOT DETECTED Final   Chlamydophila pneumoniae NOT DETECTED NOT DETECTED Final   Mycoplasma pneumoniae NOT DETECTED NOT DETECTED Final    Comment: Performed at [facility], [place]. [address] [place], [place] [address]  Body fluid culture w Gram Stain     Status: None   Collection Time: 09/28/22  5:10 PM   Specimen: Lung, Right; Pleural Fluid  Result Value Ref Range Status   Specimen Description PLEURAL RIGHT  Final   Special Requests NONE  Final   Gram Stain   Final    FEW WBC PRESENT,BOTH PMN AND MONONUCLEAR NO ORGANISMS SEEN    Culture   Final    NO GROWTH 3 DAYS Performed at [facility], [address] [place], [place] [address]    Report Status 10/02/2022 FINAL  Final    Radiology Studies: DG Wrist Complete Right  Result Date: 10/02/2022 CLINICAL DATA:  Right wrist pain. EXAM: RIGHT WRIST - COMPLETE 3+ VIEW COMPARISON:  Right hand radiographs-03/23/2021 FINDINGS: Peripherally corticated ossicle adjacent to the tip of the ulna as well as mild deformity involving the base of the second fourth and fifth metacarpals represents sequela of remote injury/fracture. No acute fracture or dislocation. Moderate degenerative change involving the STT joints of the base of the thumb with joint space loss, subchondral sclerosis and osteophytosis. Mild degenerative change of the radiocarpal articulations. Chondrocalcinosis is seen within the TFCC. Mild diffuse soft tissue swelling about the wrist. No radiopaque foreign body.  IMPRESSION: 1. No acute findings. 2. Old/healed fractures involving the base of the fourth and fifth metacarpals as well as the ulnar styloid process. 3. Degenerative change of the wrist  as detailed above, most conspicuously involving the base of the thumb. 4. Chondrocalcinosis as could be seen in the setting of CPPD. Electronically Signed   By: Sandi Mariscal M.D.   On: 10/02/2022 09:55   DG CHEST PORT 1 VIEW  Result Date: 10/01/2022 CLINICAL DATA:  Pleural effusion, pneumonia. EXAM: PORTABLE CHEST 1 VIEW COMPARISON:  09/28/2022 FINDINGS: Lungs are adequately inflated as there is moderate opacification over the right mid to lower lung with hazy opacification over the mid to upper lung likely layering pleural fluid with basilar atelectasis. Infection over the left lung is possible. Left lung is clear. Cardiomediastinal silhouette and remainder of the exam is unchanged. IMPRESSION: Moderate opacification over the right mid to lower lung likely layering pleural fluid with basilar atelectasis. Infection within the right lung is possible. Electronically Signed   By: Marin Olp M.D.   On: 10/01/2022 13:53      Dangela How T. [place]  If 7PM-7AM, please contact night-coverage [email or web address] 10/02/2022, 1:36 PM

## 2022-10-03 ENCOUNTER — Emergency Department (HOSPITAL_COMMUNITY)
Admission: EM | Admit: 2022-10-03 | Discharge: 2022-10-03 | Disposition: A | Discharge status: Skilled Nursing Facility | Payer: Medicare Other | Attending: Emergency Medicine | Admitting: Emergency Medicine

## 2022-10-03 ENCOUNTER — Emergency Department (HOSPITAL_COMMUNITY): Payer: Medicare Other

## 2022-10-03 ENCOUNTER — Other Ambulatory Visit: Payer: Self-pay

## 2022-10-03 ENCOUNTER — Encounter (HOSPITAL_COMMUNITY): Payer: Self-pay | Admitting: Emergency Medicine

## 2022-10-03 DIAGNOSIS — F0390 Unspecified dementia without behavioral disturbance: Secondary | ICD-10-CM | POA: Diagnosis not present

## 2022-10-03 DIAGNOSIS — I129 Hypertensive chronic kidney disease with stage 1 through stage 4 chronic kidney disease, or unspecified chronic kidney disease: Secondary | ICD-10-CM | POA: Insufficient documentation

## 2022-10-03 DIAGNOSIS — Z79899 Other long term (current) drug therapy: Secondary | ICD-10-CM | POA: Diagnosis not present

## 2022-10-03 DIAGNOSIS — N189 Chronic kidney disease, unspecified: Secondary | ICD-10-CM | POA: Diagnosis not present

## 2022-10-03 DIAGNOSIS — I4891 Unspecified atrial fibrillation: Secondary | ICD-10-CM | POA: Diagnosis not present

## 2022-10-03 DIAGNOSIS — R791 Abnormal coagulation profile: Secondary | ICD-10-CM | POA: Diagnosis not present

## 2022-10-03 DIAGNOSIS — J90 Pleural effusion, not elsewhere classified: Secondary | ICD-10-CM | POA: Diagnosis not present

## 2022-10-03 DIAGNOSIS — Z91040 Latex allergy status: Secondary | ICD-10-CM | POA: Insufficient documentation

## 2022-10-03 DIAGNOSIS — W01198A Fall on same level from slipping, tripping and stumbling with subsequent striking against other object, initial encounter: Secondary | ICD-10-CM | POA: Diagnosis not present

## 2022-10-03 DIAGNOSIS — R519 Headache, unspecified: Secondary | ICD-10-CM | POA: Insufficient documentation

## 2022-10-03 DIAGNOSIS — F419 Anxiety disorder, unspecified: Secondary | ICD-10-CM | POA: Diagnosis not present

## 2022-10-03 DIAGNOSIS — J189 Pneumonia, unspecified organism: Secondary | ICD-10-CM | POA: Diagnosis not present

## 2022-10-03 DIAGNOSIS — I480 Paroxysmal atrial fibrillation: Secondary | ICD-10-CM | POA: Diagnosis not present

## 2022-10-03 DIAGNOSIS — W19XXXA Unspecified fall, initial encounter: Secondary | ICD-10-CM

## 2022-10-03 LAB — COMPREHENSIVE METABOLIC PANEL
ALT: 38 U/L (ref 0–44)
AST: 83 U/L — ABNORMAL HIGH (ref 15–41)
Albumin: 2.8 g/dL — ABNORMAL LOW (ref 3.5–5.0)
Alkaline Phosphatase: 59 U/L (ref 38–126)
Anion gap: 10 (ref 5–15)
BUN: 30 mg/dL — ABNORMAL HIGH (ref 8–23)
CO2: 23 mmol/L (ref 22–32)
Calcium: 9.1 mg/dL (ref 8.9–10.3)
Chloride: 100 mmol/L (ref 98–111)
Creatinine, Ser: 1.19 mg/dL — ABNORMAL HIGH (ref 0.44–1.00)
GFR, Estimated: 45 mL/min — ABNORMAL LOW (ref >60)
Glucose, Bld: 116 mg/dL — ABNORMAL HIGH (ref 70–99)
Potassium: 5 mmol/L (ref 3.5–5.1)
Sodium: 133 mmol/L — ABNORMAL LOW (ref 135–145)
Total Bilirubin: 1.8 mg/dL — ABNORMAL HIGH (ref 0.3–1.2)
Total Protein: 5.9 g/dL — ABNORMAL LOW (ref 6.5–8.1)

## 2022-10-03 LAB — PROTIME-INR
INR: 1.5 — ABNORMAL HIGH (ref 0.8–1.2)
Prothrombin Time: 18.4 seconds — ABNORMAL HIGH (ref 11.4–15.2)

## 2022-10-03 LAB — CBC
HCT: 38.5 % (ref 36.0–46.0)
Hemoglobin: 12.7 g/dL (ref 12.0–15.0)
MCH: 32.4 pg (ref 26.0–34.0)
MCHC: 33 g/dL (ref 30.0–36.0)
MCV: 98.2 fL (ref 80.0–100.0)
Platelets: 282 10*3/uL (ref 150–400)
RBC: 3.92 MIL/uL (ref 3.87–5.11)
RDW: 14.4 % (ref 11.5–15.5)
WBC: 7.9 10*3/uL (ref 4.0–10.5)
nRBC: 0 % (ref 0.0–0.2)

## 2022-10-03 MED ORDER — ACETAMINOPHEN 325 MG PO TABS
650.0000 mg | ORAL_TABLET | Freq: Once | ORAL | Status: AC
  Administered 2022-10-03: 650 mg via ORAL
  Filled 2022-10-03: qty 2

## 2022-10-03 NOTE — ED Notes (Signed)
Patient transported to CT 

## 2022-10-03 NOTE — Plan of Care (Signed)
  Problem: Activity: ?Goal: Ability to tolerate increased activity will improve ?Outcome: Adequate for Discharge ?  ?Problem: Clinical Measurements: ?Goal: Ability to maintain a body temperature in the normal range will improve ?Outcome: Adequate for Discharge ?  ?Problem: Respiratory: ?Goal: Ability to maintain adequate ventilation will improve ?Outcome: Adequate for Discharge ?Goal: Ability to maintain a clear airway will improve ?Outcome: Adequate for Discharge ?  ?Problem: Education: ?Goal: Knowledge of General Education information will improve ?Description: Including pain rating scale, medication(s)/side effects and non-pharmacologic comfort measures ?Outcome: Adequate for Discharge ?  ?Problem: Health Behavior/Discharge Planning: ?Goal: Ability to manage health-related needs will improve ?Outcome: Adequate for Discharge ?  ?Problem: Clinical Measurements: ?Goal: Ability to maintain clinical measurements within normal limits will improve ?Outcome: Adequate for Discharge ?Goal: Will remain free from infection ?Outcome: Adequate for Discharge ?Goal: Diagnostic test results will improve ?Outcome: Adequate for Discharge ?Goal: Respiratory complications will improve ?Outcome: Adequate for Discharge ?Goal: Cardiovascular complication will be avoided ?Outcome: Adequate for Discharge ?  ?Problem: Activity: ?Goal: Risk for activity intolerance will decrease ?Outcome: Adequate for Discharge ?  ?Problem: Nutrition: ?Goal: Adequate nutrition will be maintained ?Outcome: Adequate for Discharge ?  ?Problem: Coping: ?Goal: Level of anxiety will decrease ?Outcome: Adequate for Discharge ?  ?Problem: Elimination: ?Goal: Will not experience complications related to bowel motility ?Outcome: Adequate for Discharge ?Goal: Will not experience complications related to urinary retention ?Outcome: Adequate for Discharge ?  ?Problem: Pain Managment: ?Goal: General experience of comfort will improve ?Outcome: Adequate for Discharge ?   ?Problem: Safety: ?Goal: Ability to remain free from injury will improve ?Outcome: Adequate for Discharge ?  ?Problem: Skin Integrity: ?Goal: Risk for impaired skin integrity will decrease ?Outcome: Adequate for Discharge ?  ?

## 2022-10-03 NOTE — Progress Notes (Signed)
Patient can be discharged to [facility] today. The number to call for report is [phone]. RN to call [facility] and report when ready.  Madilyn Fireman, MSW, LCSW Transitions of Care  Clinical Social Worker II [phone]

## 2022-10-03 NOTE — ED Provider Notes (Signed)
[place] Provider Note   CSN: [id number] Arrival date & time: 10/03/22  2032     History {Add pertinent medical, surgical, social history, OB history to HPI:1} Chief Complaint  Patient presents with   Tiffany Black is a 89 y.o. female.   Fall  Patient presents after witnessed fall.  This occurred at her skilled nursing facility.  She was transferring from wheelchair and fell backwards.  During this fall, she struck the back of her head.  She is on Xarelto.  Patient currently endorses pain in her occipital area but denies any other areas of discomfort.  EMS did witness her stand and transfer after the fall.  Per nursing staff facility, she does not ambulate at baseline.     Home Medications Prior to Admission medications   Medication Sig Start Date End Date Taking? Authorizing Provider  acetaminophen (TYLENOL) 500 MG tablet Take 2 tablets (1,000 mg total) by mouth every 8 (eight) hours for 5 days, THEN 2 tablets (1,000 mg total) every 8 (eight) hours as needed for up to 25 days. 10/02/22 11/01/22  Mercy Riding, MD  diclofenac Sodium (VOLTAREN) 1 % GEL Apply 2 g topically 4 (four) times daily. To right wrist 10/02/22   Mercy Riding, MD  felodipine (PLENDIL) 10 MG 24 hr tablet Take 1 tablet (10 mg total) by mouth daily. Patient taking differently: Take 5 mg by mouth daily. 08/16/22   Elgergawy, Silver Huguenin, MD  FLUoxetine (PROZAC) 10 MG capsule Take 10 mg by mouth daily.    [provider]  metoprolol tartrate (LOPRESSOR) 50 MG tablet Take 1 tablet (50 mg total) by mouth 2 (two) times daily. Patient taking differently: Take 25 mg by mouth 2 (two) times daily. 08/15/22   Elgergawy, Silver Huguenin, MD  QUEtiapine (SEROQUEL) 50 MG tablet Take 1 tablet (50 mg total) by mouth at bedtime. Patient not taking: Reported on 09/27/2022 08/15/22   Elgergawy, Silver Huguenin, MD  Rivaroxaban (XARELTO) 15 MG TABS tablet Take 1 tablet (15 mg total) by  mouth daily with supper. 03/06/22   Deboraha Sprang, MD  senna-docusate (SENOKOT-S) 8.6-50 MG tablet Take 1 tablet by mouth at bedtime as needed for mild constipation. 10/02/22   Mercy Riding, MD  valsartan (DIOVAN) 320 MG tablet Take 1 tablet (320 mg total) by mouth daily. 10/02/22   Mercy Riding, MD      Allergies    Hydrochlorothiazide, Penicillins, Ciprofloxacin, Doxycycline, Hydrocodone-acetaminophen, Iodinated contrast media, Iodine-131, Latex, Statins, Carbamazepine, Codeine, Nickel, Sulfa antibiotics, Sulfamethoxazole, and Sulfonamide derivatives    Review of Systems   Review of Systems  Unable to perform ROS: Dementia    Physical Exam Updated Vital Signs BP 117/82   Pulse 91   Resp 20   SpO2 95%  Physical Exam Vitals and nursing note reviewed.  Constitutional:      General: She is not in acute distress.    Appearance: Normal appearance. She is well-developed. She is not ill-appearing, toxic-appearing or diaphoretic.  HENT:     Head: Normocephalic.     Comments: Occipital hematoma.  Skin is intact.    Right Ear: External ear normal.     Left Ear: External ear normal.     Nose: Nose normal.     Mouth/Throat:     Mouth: Mucous membranes are moist.  Eyes:     Extraocular Movements: Extraocular movements intact.     Conjunctiva/sclera: Conjunctivae normal.  Neck:  Comments: Cervical collar in place Cardiovascular:     Rate and Rhythm: Normal rate and regular rhythm.  Pulmonary:     Effort: Pulmonary effort is normal. No respiratory distress.     Breath sounds: Normal breath sounds. No wheezing or rales.  Chest:     Chest wall: No tenderness.  Abdominal:     General: There is no distension.     Palpations: Abdomen is soft.     Tenderness: There is no abdominal tenderness.  Musculoskeletal:        General: No swelling. Normal range of motion.     Cervical back: Neck supple.     Right lower leg: No edema.     Left lower leg: No edema.  Skin:    General: Skin  is warm and dry.     Capillary Refill: Capillary refill takes less than 2 seconds.     Coloration: Skin is not jaundiced or pale.  Neurological:     General: No focal deficit present.     Mental Status: She is alert. Mental status is at baseline. She is disoriented.  Psychiatric:        Mood and Affect: Mood normal.        Behavior: Behavior normal.     ED Results / Procedures / Treatments   Labs (all labs ordered are listed, but only abnormal results are displayed) Labs Reviewed  COMPREHENSIVE METABOLIC PANEL  CBC  URINALYSIS, ROUTINE W REFLEX MICROSCOPIC  PROTIME-INR    EKG None  Radiology DG Wrist Complete Right  Result Date: 10/02/2022 CLINICAL DATA:  Right wrist pain. EXAM: RIGHT WRIST - COMPLETE 3+ VIEW COMPARISON:  Right hand radiographs-03/23/2021 FINDINGS: Peripherally corticated ossicle adjacent to the tip of the ulna as well as mild deformity involving the base of the second fourth and fifth metacarpals represents sequela of remote injury/fracture. No acute fracture or dislocation. Moderate degenerative change involving the STT joints of the base of the thumb with joint space loss, subchondral sclerosis and osteophytosis. Mild degenerative change of the radiocarpal articulations. Chondrocalcinosis is seen within the TFCC. Mild diffuse soft tissue swelling about the wrist. No radiopaque foreign body. IMPRESSION: 1. No acute findings. 2. Old/healed fractures involving the base of the fourth and fifth metacarpals as well as the ulnar styloid process. 3. Degenerative change of the wrist as detailed above, most conspicuously involving the base of the thumb. 4. Chondrocalcinosis as could be seen in the setting of CPPD. Electronically Signed   By: Sandi Mariscal M.D.   On: 10/02/2022 09:55    Procedures Procedures  {Document cardiac monitor, telemetry assessment procedure when appropriate:1}  Medications Ordered in ED Medications  acetaminophen (TYLENOL) tablet 650 mg (650 mg  Oral Given 10/03/22 2043)    ED Course/ Medical Decision Making/ A&P   {   Click here for ABCD2, HEART and other calculatorsREFRESH Note before signing :1}                          Medical Decision Making Amount and/or Complexity of Data Reviewed Labs: ordered. Radiology: ordered. ECG/medicine tests: ordered.  Risk OTC drugs.   This patient presents to the ED for concern of ***, this involves an extensive number of treatment options, and is a complaint that carries with it a high risk of complications and morbidity.  The differential diagnosis includes ***   Co morbidities that complicate the patient evaluation  ***   Additional history obtained:  Additional history obtained  from *** External records from outside source obtained and reviewed including ***   Lab Tests:  I Ordered, and personally interpreted labs.  The pertinent results include:  ***   Imaging Studies ordered:  I ordered imaging studies including ***  I independently visualized and interpreted imaging which showed *** I agree with the radiologist interpretation   Cardiac Monitoring: / EKG:  The patient was maintained on a cardiac monitor.  I personally viewed and interpreted the cardiac monitored which showed an underlying rhythm of: ***   Consultations Obtained:  I requested consultation with the ***,  and discussed lab and imaging findings as well as pertinent plan - they recommend: ***   Problem List / ED Course / Critical interventions / Medication management  Patient arrives as a level 2 trauma following a fall on blood thinners.  She does take Xarelto.  She had a witnessed fall while transferring from a wheelchair.  In the fall, she struck the back of her head.  On arrival in the ED, she is awake and alert.  Per EMS, she is currently at her mental baseline.  Patient is able answer basic questions.  She denies any areas of discomfort other than the hematoma on her occipital scalp.  Vital  signs are reassuring at this time.  Tylenol was ordered for analgesia.  Patient to undergo CT imaging.***. I ordered medication including ***  for ***  Reevaluation of the patient after these medicines showed that the patient {resolved/improved/worsened:23923::"improved"} I have reviewed the patients home medicines and have made adjustments as needed   Social Determinants of Health:  ***   Test / Admission - Considered:  ***   {Document critical care time when appropriate:1} {Document review of labs and clinical decision tools ie heart score, Chads2Vasc2 etc:1}  {Document your independent review of radiology images, and any outside records:1} {Document your discussion with family members, caretakers, and with consultants:1} {Document social determinants of health affecting pt's care:1} {Document your decision making why or why not admission, treatments were needed:1} Final Clinical Impression(s) / ED Diagnoses Final diagnoses:  None    Rx / DC Orders ED Discharge Orders     None

## 2022-10-03 NOTE — ED Triage Notes (Addendum)
Pt bib [facility] from [facility] for mechanical fall when trying to transfer from wheelchair. Pt did fall back and head head. Hematoma to back of head. On xarelto. Hx of dementia but per facility pt is at mental status baseline. C collar in place.   BP 118/60 HR 100-110, Spo2 97% ra

## 2022-10-03 NOTE — ED Notes (Signed)
Trauma Response Nurse Documentation   Tiffany Black is a 89 y.o. female arriving to [facility] ED via EMS  On Xarelto (rivaroxaban) daily. Trauma was activated as a Level 2 by ED charge RN based on the following trauma criteria Elderly patients > 65 with head trauma on anti-coagulation (excluding ASA). Trauma team at the bedside on patient arrival.   Patient cleared for CT by Dr. Doren Custard EDP. Pt transported to CT with trauma response nurse present to monitor. RN remained with the patient throughout their absence from the department for clinical observation.   GCS 14 ([id number]).  History   Past Medical History:  Diagnosis Date   Anxiety    Arthritis    Bulging of cervical intervertebral disc    Cardiomegaly    Cataracts, bilateral    Chronic neck pain    History of endometrial cancer    History of stroke    2015 and 2016 // carotid US 8/16: Bilateral 1-39 // carotid US 2/16: Bilateral ICA 1-39   HLD (hyperlipidemia)    Hx of echocardiogram    a. Echo (11/15): Mild LVH, EF 55-60%, normal wall motion, grade 2 diastolic dysfunction, mild LAE, normal RV function, PASP 29 mm Hg // echo 8/16:Mild concentric LVH, EF 55-60, normal wall motion, grade 2 diastolic dysfunction, mild LAE, PASP 39   Hypertension    Insomnia    Lung nodule    OA (osteoarthritis)    Pectus excavatum    Persistent atrial fibrillation ([place])    Vertigo      Past Surgical History:  Procedure Laterality Date   ABDOMINAL HYSTERECTOMY  2011   CATARACT EXTRACTION, BILATERAL Bilateral 2009   CESAREAN SECTION  1975   IR THORACENTESIS ASP PLEURAL SPACE W/IMG GUIDE  09/28/2022   LAPAROSCOPIC HYSTERECTOMY  2011       Initial Focused Assessment (If applicable, or please see trauma documentation): Alert/confused female presents via EMS from [facility] after a fall from her wheelchair. Pt just discharged back to facility from 2W, admitted for pleural effusion. Hematoma to the back of her head. Airway  patent/unobstructed, BS diminished No obvious uncontrolled hemorrhage GCS 14 PERRLA 8m  CT's Completed:   CT Head and CT C-Spine   Interventions:  IV start and trauma lab draw [facility] for comfort Portable chest and pelvis XRAY CT head and cervical Tylenol for pain control  Plan for disposition:  Pending workup  Consults completed:  none at the time of this note.  Event Summary: Pt presents via EMS after a fall from her wheelchair. Hematoma to the back of her head. Takes xarelto, hx AFIB and dementia. [facility].    Bedside handoff with ED RN GShirlee Limerick    Tiffany Black  Trauma Response RN  Please call TRN at [phone] further assistance.

## 2022-10-03 NOTE — Discharge Instructions (Signed)
The imaging studies in the emergency room are reassuring.  Continue to take your medications as prescribed.  Take Tylenol as needed for pain and soreness.  Return to the emergency department for any new or worsening symptoms of concern.

## 2022-10-03 NOTE — ED Notes (Addendum)
Pt removed C collar. MD notified and verified okay for c collar to remain off.

## 2022-10-03 NOTE — Discharge Summary (Signed)
Physician Discharge Summary  Tiffany Black KXF:[id number] DOB: 23-May-1934 DOA: 09/27/2022  PCP: Ginger Organ., MD  Admit date: 09/27/2022 Discharge date: 10/03/2022 Admitted From: Home Disposition: SNF Recommendations for Outpatient Follow-up:   Check blood pressure, CMP and CBC in 1 week. Check chest x-ray to assess for recurrence of pleural effusion in 1 week or sooner if needed Pulmonology recommended outpatient thoracocentesis with fluid studies including cytology if pleural fluid recurs Outpatient follow-up with pulmonology Please follow up on the following pending results: Pleural fluid cytology   Discharge Condition: Stable CODE STATUS: Full code  Contact information for after-discharge care     Destination     [facility] Preferred SNF .   Service: Skilled Nursing Contact information: [place] [facility] 89 year old F with PMH of dementia, paroxysmal A-fib on Xarelto, CVA, anxiety recent COVID-19 infection about a month ago presenting with shortness of breath, refusal to eat or take her antibiotics for  pneumonia.  Seen in ED the day prior to presentation and diagnosed with pneumonia and discharged on oral antibiotics.    In ED, stable vitals.  No leukocytosis.  CT chest concerning for consolidation involving the RML and RLL with large effusion and multiple healing rib fractures.  She was started on CTX and azithromycin.  Underwent right thoracocentesis with removal of 900 mL dark red exudative fluid on 1/18.  Fluid cultures negative.  Cytology pending.    After further review of her chart and had thoracocentesis, it felt her pleural effusion is likely from hemothorax after she sustained a fall with rib fractures although difficult to rule out malignancy.Marland Kitchen  Antibiotics discontinued.  Remained stable off antibiotics.  Follow-up pleural fluid cytology. Pulmonology  recommended recommended repeat CXR in 1 week or sooner if needed, and holding Xarelto and setting up outpatient thoracocentesis with repeat fluid study   Medically stable for discharge to SNF.   See individual problem list below for more.   Problems addressed during this hospitalization Active Problems:   Pleural effusion on right   Paroxysmal atrial fibrillation with RVR (HCC)   Anxiety   Dementia with behavioral disturbance (HCC)   Essential hypertension   Lung nodule   Shortness of breath   Multiple fractures of ribs, right side, subsequent encounter for fracture with routine healing   Goals of care, counseling/discussion   Pain and swelling of right wrist   Shortness of breath/right-sided pleural effusion: No fever, leukocytosis.  COVID-19, influenza, full RVP and Pro-Cal negative.  No oxygen requirement.  She has multiple right rib fractures with callus formation and right-sided pleural effusion that yielded 900 cc dark red exudative culture negative fluid suggesting traumatic event likely from her fall around Thanksgiving.  Patient is not reliable historian due to dementia. -Remains stable off antibiotics.  Pneumonia ruled out. -Follow pleural fluid cytology -Pulmonology recommended recommended repeat CXR in 1 week or sooner if needed, and holding Xarelto and setting up outpatient thoracocentesis with repeat fluid study   Multiple healing right-sided rib fractures: Reportedly, patient had a ground-level mechanical fall before last hospitalization last month.   Positive blood culture: Staph epidermidis in 1 out of 2 bottles likely contaminant.  Patient has no signs of infection.   Right wrist pain: X-ray without acute finding but old/healed fractures involving the base of the fourth and fifth metacarpals as well as the  ulnar styloid process, degenerative changes and chondrocalcinosis.  Uric acid 4.1.  Pain and swelling improved. -Pain control with scheduled Tylenol and Voltaren  gel   Persistent A-fib: Fairly controlled heart rate. -Continue Xarelto and metoprolol   Dementia with behavioral disturbance/delirium: Waxing and waning mental status.  Oriented to self, person, place and month. -Reorientation and delirium precautions -Continue home Seroquel.   Anxiety: Stable. -Continue home Prozac and Seroquel   Essential hypertension: BP improved after adjusting meds. -Increased metoprolol to 50 mg twice daily on 1/19 -Increased Avapro to 300 mg daily on 1/20 -Continue felodipine -Reassess blood pressure and adjust as appropriate.   RUL nodule: Noted on CT. -Outpatient follow-up recommended    Hypokalemia: Resolved.   Goal of care counseling: Full code -Appreciate help by palliative medicine   Inadequate oral intake Nutrition Problem: Inadequate oral intake Etiology: decreased appetite Signs/Symptoms: per patient/family report Interventions: Refer to RD note for recommendations     Vital signs Vitals:   10/02/22 1543 10/02/22 1959 10/03/22 0324 10/03/22 0755  BP: 101/60 122/65 122/79 127/72  Pulse: 83 84 88 84  Temp: 97.7 F (36.5 C) (!) 97.5 F (36.4 C) 98.1 F (36.7 C) (!) 97.5 F (36.4 C)  Resp: '16 16 16 18  '$ Height:      Weight:      SpO2: 94% 94% 94% 97%  TempSrc: Oral Oral Oral Oral  BMI (Calculated):         Discharge exam  GENERAL: No apparent distress.  Nontoxic. HEENT: MMM.  Vision and hearing grossly intact.  NECK: Supple.  No apparent JVD.  RESP:  No IWOB.  Fair aeration bilaterally. CVS:  RRR. Heart sounds normal.  ABD/GI/GU: BS+. Abd soft, NTND.  MSK/EXT:  Moves extremities. No apparent deformity. No edema.  SKIN: no apparent skin lesion or wound NEURO: Awake and alert. Oriented to self, place and person.  Follows commands.  No apparent focal neuro deficit. PSYCH: Calm. Normal affect.   Discharge Instructions Discharge Instructions     Diet general   Complete by: As directed    Increase activity slowly   Complete  by: As directed       Allergies as of 10/03/2022       Reactions   Hydrochlorothiazide Hives   Penicillins Hives   Has patient had a PCN reaction causing immediate rash, facial/tongue/throat swelling, SOB or lightheadedness with hypotension: Yes Has patient had a PCN reaction causing severe rash involving mucus membranes or skin necrosis: No Has patient had a PCN reaction that required hospitalization No Has patient had a PCN reaction occurring within the last 10 years: No If all of the above answers are "NO", then may proceed with Cephalosporin use.   Ciprofloxacin Other (See Comments), Hypertension   Blood pressure issues- patient doesn't recall if it increased OR decreased it   Doxycycline Diarrhea, Nausea And Vomiting   Hydrocodone-acetaminophen Other (See Comments)   Unknown reaction   Iodinated Contrast Media Diarrhea, Other (See Comments), Hypertension   Elevated the B/P   Iodine-131 Other (See Comments), Hypertension   Raised the blood pressure   Latex Itching   Statins Other (See Comments)   Muscle weakness   Carbamazepine Other (See Comments), Rash   Flushed blood pressure medication out of system   Codeine Nausea And Vomiting   Nickel Rash   Sulfa Antibiotics Hives, Rash   Sulfamethoxazole Hives, Rash   Sulfonamide Derivatives Hives, Rash           Medication List  STOP taking these medications    amLODipine 5 MG tablet Commonly known as: NORVASC   azithromycin 250 MG tablet Commonly known as: ZITHROMAX   cephALEXin 500 MG capsule Commonly known as: KEFLEX   ezetimibe 10 MG tablet Commonly known as: ZETIA   Klor-Con M20 20 MEQ tablet Generic drug: potassium chloride SA   Meclizine HCl 25 MG Chew   Tylenol PM Extra Strength 25-500 MG Tabs tablet Generic drug: diphenhydramine-acetaminophen       TAKE these medications    acetaminophen 500 MG tablet Commonly known as: TYLENOL Take 2 tablets (1,000 mg total) by mouth every 8 (eight)  hours for 5 days, THEN 2 tablets (1,000 mg total) every 8 (eight) hours as needed for up to 25 days. Start taking on: October 02, 2022 What changed:  medication strength See the new instructions.   diclofenac Sodium 1 % Gel Commonly known as: VOLTAREN Apply 2 g topically 4 (four) times daily. To right wrist   felodipine 10 MG 24 hr tablet Commonly known as: PLENDIL Take 1 tablet (10 mg total) by mouth daily. What changed: how much to take   FLUoxetine 10 MG capsule Commonly known as: PROZAC Take 10 mg by mouth daily.   metoprolol tartrate 50 MG tablet Commonly known as: LOPRESSOR Take 1 tablet (50 mg total) by mouth 2 (two) times daily. What changed: how much to take   QUEtiapine 50 MG tablet Commonly known as: SEROQUEL Take 1 tablet (50 mg total) by mouth at bedtime.   Rivaroxaban 15 MG Tabs tablet Commonly known as: Xarelto Take 1 tablet (15 mg total) by mouth daily with supper.   senna-docusate 8.6-50 MG tablet Commonly known as: Senokot-S Take 1 tablet by mouth at bedtime as needed for mild constipation.   valsartan 320 MG tablet Commonly known as: DIOVAN Take 1 tablet (320 mg total) by mouth daily. What changed:  medication strength how much to take when to take this        Consultations: Pulmonology  Procedures/Studies: 1/18-IR thoracocentesis    DG Wrist Complete Right  Result Date: 10/02/2022 CLINICAL DATA:  Right wrist pain. EXAM: RIGHT WRIST - COMPLETE 3+ VIEW COMPARISON:  Right hand radiographs-03/23/2021 FINDINGS: Peripherally corticated ossicle adjacent to the tip of the ulna as well as mild deformity involving the base of the second fourth and fifth metacarpals represents sequela of remote injury/fracture. No acute fracture or dislocation. Moderate degenerative change involving the STT joints of the base of the thumb with joint space loss, subchondral sclerosis and osteophytosis. Mild degenerative change of the radiocarpal articulations.  Chondrocalcinosis is seen within the TFCC. Mild diffuse soft tissue swelling about the wrist. No radiopaque foreign body. IMPRESSION: 1. No acute findings. 2. Old/healed fractures involving the base of the fourth and fifth metacarpals as well as the ulnar styloid process. 3. Degenerative change of the wrist as detailed above, most conspicuously involving the base of the thumb. 4. Chondrocalcinosis as could be seen in the setting of CPPD. Electronically Signed   By: Sandi Mariscal M.D.   On: 10/02/2022 09:55   DG CHEST PORT 1 VIEW  Result Date: 10/01/2022 CLINICAL DATA:  Pleural effusion, pneumonia. EXAM: PORTABLE CHEST 1 VIEW COMPARISON:  09/28/2022 FINDINGS: Lungs are adequately inflated as there is moderate opacification over the right mid to lower lung with hazy opacification over the mid to upper lung likely layering pleural fluid with basilar atelectasis. Infection over the left lung is possible. Left lung is clear. Cardiomediastinal silhouette and remainder of  the exam is unchanged. IMPRESSION: Moderate opacification over the right mid to lower lung likely layering pleural fluid with basilar atelectasis. Infection within the right lung is possible. Electronically Signed   By: Marin Olp M.D.   On: 10/01/2022 13:53   IR THORACENTESIS ASP PLEURAL SPACE W/IMG GUIDE  Result Date: 09/28/2022 INDICATION: Right pleural effusion EXAM: ULTRASOUND GUIDED RIGHT THORACENTESIS MEDICATIONS: 7 cc 1% lidocaine COMPLICATIONS: None immediate. PROCEDURE: An ultrasound guided thoracentesis was thoroughly discussed with the patient and questions answered. The benefits, risks, alternatives and complications were also discussed. The patient understands and wishes to proceed with the procedure. Written consent was obtained. Ultrasound was performed to localize and mark an adequate pocket of fluid in the right chest. The area was then prepped and draped in the normal sterile fashion. 1% Lidocaine was used for local anesthesia.  Under ultrasound guidance a 6 Fr Safe-T-Centesis catheter was introduced. Thoracentesis was performed. The catheter was removed and a dressing applied. FINDINGS: A total of approximately 900 mL of dark red fluid was removed. Samples were sent to the laboratory as requested by the clinical team. IMPRESSION: Successful ultrasound guided right thoracentesis yielding 900 mL of pleural fluid. Follow-up chest x-ray revealed no evidence of pneumothorax. Read by: Reatha Armour, PA-C Electronically Signed   By: Corrie Mckusick D.O.   On: 09/28/2022 17:19   DG Chest 1 View  Result Date: 09/28/2022 CLINICAL DATA:  Pleural effusion on the right.  Post thoracentesis. EXAM: CHEST  1 VIEW COMPARISON:  Chest x-ray 09/27/2022. FINDINGS: Decreased right pleural effusion. No visible pneumothorax. Similar enlarged cardiac silhouette. No acute osseous abnormality. Polyarticular degenerative change IMPRESSION: Decreased right pleural effusion. No visible pneumothorax. Electronically Signed   By: Margaretha Sheffield M.D.   On: 09/28/2022 15:47   CT Chest Wo Contrast  Result Date: 09/27/2022 CLINICAL DATA:  Right basilar infiltrate and effusion on recent chest x-ray EXAM: CT CHEST WITHOUT CONTRAST TECHNIQUE: Multidetector CT imaging of the chest was performed following the standard protocol without IV contrast. RADIATION DOSE REDUCTION: This exam was performed according to the departmental dose-optimization program which includes automated exposure control, adjustment of the mA and/or kV according to patient size and/or use of iterative reconstruction technique. COMPARISON:  Chest x-ray from earlier in the same day. FINDINGS: Cardiovascular: Limited due to lack of IV contrast. Atherosclerotic calcifications of the thoracic aorta are noted. Coronary calcifications are seen. No cardiac enlargement is noted. Mediastinum/Nodes: The esophagus is within normal limits. Thoracic inlet is unremarkable. Scattered small mediastinal and hilar  lymph nodes are noted likely reactive in nature. Lungs/Pleura: Left lung is well aerated without focal infiltrate or sizable effusion. Minimal scarring is noted in the left base. Right lung shows evidence of near complete collapse of the right lower lobe as well as collapse of the right middle lobe. Associated large effusion is noted. Some scattered calcifications are noted within the collapsed lower lobe consistent with prior granulomatous disease. A small less than 5 mm nodule is noted in the right upper lobe best seen on image number 28 of series 8. Upper Abdomen: Visualized upper abdomen shows no acute abnormality. Musculoskeletal: Degenerative changes of the thoracic spine are noted. Multiple healing rib fractures are identified on the right laterally and posterolaterally to include the 8 through eleventh ribs. No pneumothorax is noted. IMPRESSION: Consolidation involving the right middle and right lower lobes consistent with pneumonia and associated large effusion. Multiple right rib fractures are noted with callus formation consistent with healing. 4 mm right solid pulmonary  nodule within the upper lobe. Per Fleischner Society Guidelines, if patient is low risk for malignancy, no routine follow-up imaging is recommended. If patient is high risk for malignancy, a non-contrast Chest CT at 12 months is optional. If performed and the nodule is stable at 12 months, no further follow-up is recommended. These guidelines do not apply to immunocompromised patients and patients with cancer. Follow up in patients with significant comorbidities as clinically warranted. For lung cancer screening, adhere to Lung-RADS guidelines. Reference: Radiology. 2017; 284(1):228-43. Changes consistent with prior granulomatous disease. Aortic Atherosclerosis (ICD10-I70.0). Electronically Signed   By: Inez Catalina M.D.   On: 09/27/2022 19:44   DG Chest 2 View  Result Date: 09/27/2022 CLINICAL DATA:  Follow up pneumonia EXAM: CHEST -  2 VIEW COMPARISON:  One-view x-ray 09/26/2022 and older FINDINGS: Moderate right pleural effusion. Adjacent opacity. Hyperinflation. Left lung is clear. No edema. Enlarged cardiopericardial silhouette with tortuous and ectatic aorta. Osteopenia with degenerative changes. IMPRESSION: No significant interval change when adjusting for technique. Electronically Signed   By: Jill Side M.D.   On: 09/27/2022 18:44   DG Chest Portable 1 View  Result Date: 09/26/2022 CLINICAL DATA:  Dizziness and shortness of breath EXAM: PORTABLE CHEST 1 VIEW COMPARISON:  08/26/2022 FINDINGS: Developing moderate right pleural effusion with adjacent opacity. Hyperinflation. Chronic interstitial changes. No consolidation in the left lung. No pneumothorax. Enlarged cardiopericardial silhouette with calcified aorta. Overlapping cardiac leads. The left inferior costophrenic angle is clipped at the edge of the film. IMPRESSION: Developing moderate right pleural effusion with adjacent opacity. Recommend follow-up Electronically Signed   By: Jill Side M.D.   On: 09/26/2022 16:09       The results of significant diagnostics from this hospitalization (including imaging, microbiology, ancillary and laboratory) are listed below for reference.     Microbiology: Recent Results (from the past 240 hour(s))  Blood culture (routine x 2)     Status: Abnormal   Collection Time: 09/26/22  6:00 PM   Specimen: BLOOD  Result Value Ref Range Status   Specimen Description BLOOD LEFT ANTECUBITAL  Final   Special Requests   Final    BOTTLES DRAWN AEROBIC AND ANAEROBIC Blood Culture results may not be optimal due to an inadequate volume of blood received in culture bottles   Culture  Setup Time   Final    GRAM POSITIVE COCCI IN CLUSTERS IN BOTH AEROBIC AND ANAEROBIC BOTTLES CRITICAL RESULT CALLED TO, READ BACK BY AND VERIFIED WITH: RN WHITNEY HICKS ON 09/27/22 @ 2138 BY DRT    Culture (A)  Final    STAPHYLOCOCCUS EPIDERMIDIS THE  SIGNIFICANCE OF ISOLATING THIS ORGANISM FROM A SINGLE SET OF BLOOD CULTURES WHEN MULTIPLE SETS ARE DRAWN IS UNCERTAIN. PLEASE NOTIFY THE MICROBIOLOGY DEPARTMENT WITHIN ONE WEEK IF SPECIATION AND SENSITIVITIES ARE REQUIRED. Performed at [facility], [place] [address] [place], [place] [address]    Report Status 09/29/2022 FINAL  Final  Blood culture (routine x 2)     Status: None   Collection Time: 09/26/22  6:00 PM   Specimen: BLOOD  Result Value Ref Range Status   Specimen Description BLOOD RIGHT ANTECUBITAL  Final   Special Requests   Final    BOTTLES DRAWN AEROBIC AND ANAEROBIC Blood Culture adequate volume   Culture   Final    NO GROWTH 5 DAYS Performed at [facility], [place] [address], [place], [place] [address]    Report Status 10/01/2022 FINAL  Final  Respiratory (~20 pathogens)  panel by PCR     Status: None   Collection Time: 09/28/22  9:43 AM   Specimen: Nasopharyngeal Swab; Respiratory  Result Value Ref Range Status   Adenovirus NOT DETECTED NOT DETECTED Final   Coronavirus 229E NOT DETECTED NOT DETECTED Final    Comment: (NOTE) The Coronavirus on the Respiratory Panel, DOES NOT test for the novel  Coronavirus (2019 nCoV)    Coronavirus HKU1 NOT DETECTED NOT DETECTED Final   Coronavirus NL63 NOT DETECTED NOT DETECTED Final   Coronavirus OC43 NOT DETECTED NOT DETECTED Final   Metapneumovirus NOT DETECTED NOT DETECTED Final   Rhinovirus / Enterovirus NOT DETECTED NOT DETECTED Final   Influenza A NOT DETECTED NOT DETECTED Final   Influenza B NOT DETECTED NOT DETECTED Final   Parainfluenza Virus 1 NOT DETECTED NOT DETECTED Final   Parainfluenza Virus 2 NOT DETECTED NOT DETECTED Final   Parainfluenza Virus 3 NOT DETECTED NOT DETECTED Final   Parainfluenza Virus 4 NOT DETECTED NOT DETECTED Final   Respiratory Syncytial Virus NOT DETECTED NOT DETECTED Final   Bordetella pertussis NOT DETECTED NOT DETECTED Final   Bordetella Parapertussis NOT DETECTED NOT DETECTED  Final   Chlamydophila pneumoniae NOT DETECTED NOT DETECTED Final   Mycoplasma pneumoniae NOT DETECTED NOT DETECTED Final    Comment: Performed at [facility], [place]. [address] [place], [place] [address]  Body fluid culture w Gram Stain     Status: None   Collection Time: 09/28/22  5:10 PM   Specimen: Lung, Right; Pleural Fluid  Result Value Ref Range Status   Specimen Description PLEURAL RIGHT  Final   Special Requests NONE  Final   Gram Stain   Final    FEW WBC PRESENT,BOTH PMN AND MONONUCLEAR NO ORGANISMS SEEN    Culture   Final    NO GROWTH 3 DAYS Performed at [facility], [address], [place], [place] [address]    Report Status 10/02/2022 FINAL  Final     Labs:  CBC: Recent Labs  Lab 09/26/22 1554 09/28/22 0400 09/29/22 0626 09/30/22 0824  WBC 5.2 4.0 8.0 8.3  NEUTROABS 3.3  --   --   --   HGB 12.2 14.8 12.7 14.1  HCT 37.8 44.5 37.5 41.4  MCV 99.2 95.9 94.7 95.6  PLT 168 119* 194 187   BMP &GFR Recent Labs  Lab 09/26/22 1554 09/26/22 1925 09/28/22 0400 09/28/22 0626 09/29/22 0626 09/30/22 0824  NA 137 139 139  --  137 138  K 3.9 3.4* 4.5  --  3.4* 3.9  CL 103 113* 107  --  105 102  CO2 17* 15* 20*  --  23 23  GLUCOSE 88 89 89  --  99 87  BUN '11 9 10  '$ --  10 14  CREATININE 1.00 0.82 1.14*  --  1.14* 1.03*  CALCIUM 8.9 7.0* 8.9  --  8.9 9.4  MG  --   --   --  1.8  --  1.9  PHOS  --   --   --   --   --  3.3   Estimated Creatinine Clearance: 37.1 mL/min (A) (by C-G formula based on SCr of 1.03 mg/dL (H)). Liver & Pancreas: Recent Labs  Lab 09/26/22 1554 09/26/22 1925 09/28/22 0626 09/30/22 0824  AST '25 25 23  '$ --   ALT QUANTITY NOT SUFFICIENT, UNABLE TO PERFORM TEST 10 11  --   ALKPHOS 69 54 69  --   BILITOT QUANTITY  NOT SUFFICIENT, UNABLE TO PERFORM TEST 1.0 0.5  --   PROT 5.6* 4.2* 5.7*  5.7*  --   ALBUMIN 3.2* 2.5* 3.2* 3.2*   No results for input(s): "LIPASE", "AMYLASE" in the last 168 hours. No results for input(s):  "AMMONIA" in the last 168 hours. Diabetic: No results for input(s): "HGBA1C" in the last 72 hours. No results for input(s): "GLUCAP" in the last 168 hours. Cardiac Enzymes: No results for input(s): "CKTOTAL", "CKMB", "CKMBINDEX", "TROPONINI" in the last 168 hours. No results for input(s): "PROBNP" in the last 8760 hours. Coagulation Profile: No results for input(s): "INR", "PROTIME" in the last 168 hours. Thyroid Function Tests: No results for input(s): "TSH", "T4TOTAL", "FREET4", "T3FREE", "THYROIDAB" in the last 72 hours. Lipid Profile: No results for input(s): "CHOL", "HDL", "LDLCALC", "TRIG", "CHOLHDL", "LDLDIRECT" in the last 72 hours. Anemia Panel: No results for input(s): "VITAMINB12", "FOLATE", "FERRITIN", "TIBC", "IRON", "RETICCTPCT" in the last 72 hours. Urine analysis:    Component Value Date/Time   COLORURINE STRAW (A) 08/26/2022 2355   APPEARANCEUR CLEAR 08/26/2022 2355   LABSPEC 1.008 08/26/2022 2355   PHURINE 7.0 08/26/2022 2355   GLUCOSEU NEGATIVE 08/26/2022 2355   HGBUR NEGATIVE 08/26/2022 2355   BILIRUBINUR NEGATIVE 08/26/2022 [place] 08/26/2022 2355   PROTEINUR NEGATIVE 08/26/2022 2355   UROBILINOGEN 0.2 05/22/2015 1950   NITRITE NEGATIVE 08/26/2022 2355   LEUKOCYTESUR NEGATIVE 08/26/2022 2355   Sepsis Labs: Invalid input(s): "PROCALCITONIN", "LACTICIDVEN"   SIGNED:  Mercy Riding, MD  [facility] 10/03/2022, 10:34 AM

## 2022-10-09 ENCOUNTER — Ambulatory Visit: Payer: Medicare Other | Admitting: Physician Assistant

## 2022-10-20 ENCOUNTER — Emergency Department (HOSPITAL_COMMUNITY): Payer: Medicare Other

## 2022-10-20 ENCOUNTER — Inpatient Hospital Stay (HOSPITAL_COMMUNITY)
Admission: EM | Admit: 2022-10-20 | Discharge: 2022-10-24 | DRG: 187 | Disposition: A | Discharge status: Skilled Nursing Facility | Payer: Medicare Other | Source: Skilled Nursing Facility | Attending: Internal Medicine | Admitting: Internal Medicine

## 2022-10-20 ENCOUNTER — Encounter (HOSPITAL_COMMUNITY): Payer: Self-pay | Admitting: Internal Medicine

## 2022-10-20 DIAGNOSIS — I10 Essential (primary) hypertension: Secondary | ICD-10-CM | POA: Diagnosis present

## 2022-10-20 DIAGNOSIS — D638 Anemia in other chronic diseases classified elsewhere: Secondary | ICD-10-CM | POA: Diagnosis present

## 2022-10-20 DIAGNOSIS — Z8542 Personal history of malignant neoplasm of other parts of uterus: Secondary | ICD-10-CM

## 2022-10-20 DIAGNOSIS — Z882 Allergy status to sulfonamides status: Secondary | ICD-10-CM

## 2022-10-20 DIAGNOSIS — D649 Anemia, unspecified: Secondary | ICD-10-CM | POA: Diagnosis present

## 2022-10-20 DIAGNOSIS — Z880 Allergy status to penicillin: Secondary | ICD-10-CM

## 2022-10-20 DIAGNOSIS — Z823 Family history of stroke: Secondary | ICD-10-CM

## 2022-10-20 DIAGNOSIS — Z79899 Other long term (current) drug therapy: Secondary | ICD-10-CM

## 2022-10-20 DIAGNOSIS — W19XXXA Unspecified fall, initial encounter: Secondary | ICD-10-CM | POA: Insufficient documentation

## 2022-10-20 DIAGNOSIS — I4819 Other persistent atrial fibrillation: Secondary | ICD-10-CM | POA: Diagnosis present

## 2022-10-20 DIAGNOSIS — G4700 Insomnia, unspecified: Secondary | ICD-10-CM | POA: Diagnosis present

## 2022-10-20 DIAGNOSIS — Z91048 Other nonmedicinal substance allergy status: Secondary | ICD-10-CM

## 2022-10-20 DIAGNOSIS — Z91040 Latex allergy status: Secondary | ICD-10-CM

## 2022-10-20 DIAGNOSIS — Z885 Allergy status to narcotic agent status: Secondary | ICD-10-CM

## 2022-10-20 DIAGNOSIS — F0394 Unspecified dementia, unspecified severity, with anxiety: Secondary | ICD-10-CM | POA: Diagnosis present

## 2022-10-20 DIAGNOSIS — Z1152 Encounter for screening for COVID-19: Secondary | ICD-10-CM

## 2022-10-20 DIAGNOSIS — Z8249 Family history of ischemic heart disease and other diseases of the circulatory system: Secondary | ICD-10-CM

## 2022-10-20 DIAGNOSIS — Z90710 Acquired absence of both cervix and uterus: Secondary | ICD-10-CM

## 2022-10-20 DIAGNOSIS — Z8673 Personal history of transient ischemic attack (TIA), and cerebral infarction without residual deficits: Secondary | ICD-10-CM

## 2022-10-20 DIAGNOSIS — F411 Generalized anxiety disorder: Secondary | ICD-10-CM | POA: Diagnosis present

## 2022-10-20 DIAGNOSIS — J90 Pleural effusion, not elsewhere classified: Secondary | ICD-10-CM | POA: Diagnosis not present

## 2022-10-20 DIAGNOSIS — R911 Solitary pulmonary nodule: Secondary | ICD-10-CM | POA: Diagnosis present

## 2022-10-20 DIAGNOSIS — Z91041 Radiographic dye allergy status: Secondary | ICD-10-CM

## 2022-10-20 DIAGNOSIS — Z888 Allergy status to other drugs, medicaments and biological substances status: Secondary | ICD-10-CM

## 2022-10-20 DIAGNOSIS — M1990 Unspecified osteoarthritis, unspecified site: Secondary | ICD-10-CM | POA: Diagnosis present

## 2022-10-20 DIAGNOSIS — Z7901 Long term (current) use of anticoagulants: Secondary | ICD-10-CM

## 2022-10-20 DIAGNOSIS — E785 Hyperlipidemia, unspecified: Secondary | ICD-10-CM | POA: Diagnosis present

## 2022-10-20 DIAGNOSIS — E871 Hypo-osmolality and hyponatremia: Secondary | ICD-10-CM | POA: Diagnosis present

## 2022-10-20 HISTORY — DX: Unspecified dementia, unspecified severity, without behavioral disturbance, psychotic disturbance, mood disturbance, and anxiety: F03.90

## 2022-10-20 HISTORY — DX: Essential (primary) hypertension: I10

## 2022-10-20 LAB — I-STAT CHEM 8, ED
BUN: 14 mg/dL (ref 8–23)
Calcium, Ion: 1.1 mmol/L — ABNORMAL LOW (ref 1.15–1.40)
Chloride: 102 mmol/L (ref 98–111)
Creatinine, Ser: 1.2 mg/dL — ABNORMAL HIGH (ref 0.44–1.00)
Glucose, Bld: 119 mg/dL — ABNORMAL HIGH (ref 70–99)
HCT: 33 % — ABNORMAL LOW (ref 36.0–46.0)
Hemoglobin: 11.2 g/dL — ABNORMAL LOW (ref 12.0–15.0)
Potassium: 3.5 mmol/L (ref 3.5–5.1)
Sodium: 136 mmol/L (ref 135–145)
TCO2: 25 mmol/L (ref 22–32)

## 2022-10-20 LAB — COMPREHENSIVE METABOLIC PANEL
ALT: 27 U/L (ref 0–44)
AST: 29 U/L (ref 15–41)
Albumin: 3.3 g/dL — ABNORMAL LOW (ref 3.5–5.0)
Alkaline Phosphatase: 73 U/L (ref 38–126)
Anion gap: 10 (ref 5–15)
BUN: 14 mg/dL (ref 8–23)
CO2: 22 mmol/L (ref 22–32)
Calcium: 8.9 mg/dL (ref 8.9–10.3)
Chloride: 101 mmol/L (ref 98–111)
Creatinine, Ser: 1.21 mg/dL — ABNORMAL HIGH (ref 0.44–1.00)
GFR, Estimated: 44 mL/min — ABNORMAL LOW (ref >60)
Glucose, Bld: 122 mg/dL — ABNORMAL HIGH (ref 70–99)
Potassium: 3.5 mmol/L (ref 3.5–5.1)
Sodium: 133 mmol/L — ABNORMAL LOW (ref 135–145)
Total Bilirubin: 0.9 mg/dL (ref 0.3–1.2)
Total Protein: 5.9 g/dL — ABNORMAL LOW (ref 6.5–8.1)

## 2022-10-20 LAB — CBC WITH DIFFERENTIAL/PLATELET
Abs Immature Granulocytes: 0.01 10*3/uL (ref 0.00–0.07)
Basophils Absolute: 0.1 10*3/uL (ref 0.0–0.1)
Basophils Relative: 1 %
Eosinophils Absolute: 0.3 10*3/uL (ref 0.0–0.5)
Eosinophils Relative: 5 %
HCT: 33 % — ABNORMAL LOW (ref 36.0–46.0)
Hemoglobin: 10.7 g/dL — ABNORMAL LOW (ref 12.0–15.0)
Immature Granulocytes: 0 %
Lymphocytes Relative: 19 %
Lymphs Abs: 1.2 10*3/uL (ref 0.7–4.0)
MCH: 32 pg (ref 26.0–34.0)
MCHC: 32.4 g/dL (ref 30.0–36.0)
MCV: 98.8 fL (ref 80.0–100.0)
Monocytes Absolute: 0.5 10*3/uL (ref 0.1–1.0)
Monocytes Relative: 8 %
Neutro Abs: 4 10*3/uL (ref 1.7–7.7)
Neutrophils Relative %: 67 %
Platelets: 219 10*3/uL (ref 150–400)
RBC: 3.34 MIL/uL — ABNORMAL LOW (ref 3.87–5.11)
RDW: 14.8 % (ref 11.5–15.5)
WBC: 6.1 10*3/uL (ref 4.0–10.5)
nRBC: 0 % (ref 0.0–0.2)

## 2022-10-20 LAB — PROTIME-INR
INR: 2.3 — ABNORMAL HIGH (ref 0.8–1.2)
Prothrombin Time: 24.8 seconds — ABNORMAL HIGH (ref 11.4–15.2)

## 2022-10-20 LAB — CK: Total CK: 55 U/L (ref 38–234)

## 2022-10-20 MED ORDER — ACETAMINOPHEN 650 MG RE SUPP: 650.0000 mg | Freq: Four times a day (QID) | RECTAL | Status: DC | PRN

## 2022-10-20 MED ORDER — MELATONIN 3 MG PO TABS
3.0000 mg | ORAL_TABLET | Freq: Every evening | ORAL | Status: DC | PRN
  Administered 2022-10-21 – 2022-10-23 (×3): 3 mg via ORAL
  Filled 2022-10-20 (×3): qty 1

## 2022-10-20 MED ORDER — ACETAMINOPHEN 325 MG PO TABS: 650.0000 mg | ORAL_TABLET | Freq: Four times a day (QID) | ORAL | Status: DC | PRN

## 2022-10-20 NOTE — ED Provider Notes (Signed)
[facility] Provider Note   CSN: [id number] Arrival date & time: 10/20/22  2051     History  No chief complaint on file.   Tiffany Black is a 89 y.o. female.  With PMH of A-fib on Xarelto, dementia, CKD brought in by EMS from [place] after being found on the ground from her bed for unwitnessed fall.  There was no visualized injuries on exam with EMS.  Of note they at first had noted she was febrile to 101 and tachycardic did not give any medications but on arrival to ED afebrile 36.6 C.  Patient is pleasantly confused and demented and unable to provide me history from what happened.  She does not know how she ended up on the floor but does not think she fell.  She is currently currently without any complaints.  Denying any pain in her body.  Denying any numbness or tingling.  Does not think she lost consciousness.  She does not think she has been febrile recently or coughing more than usual or sick. \ Facility has been attempted to be called 3 times without any answer.  EMS also noted that they were unable to get any good history from the facility and it seems like facility did not know much about patient's history or med history or what was going on with patient.  HPI     Home Medications Prior to Admission medications   Medication Sig Start Date End Date Taking? Authorizing Provider  acetaminophen (TYLENOL) 500 MG tablet Take 2 tablets (1,000 mg total) by mouth every 8 (eight) hours for 5 days, THEN 2 tablets (1,000 mg total) every 8 (eight) hours as needed for up to 25 days. 10/02/22 11/01/22  Mercy Riding, MD  diclofenac Sodium (VOLTAREN) 1 % GEL Apply 2 g topically 4 (four) times daily. To right wrist 10/02/22   Mercy Riding, MD  felodipine (PLENDIL) 10 MG 24 hr tablet Take 1 tablet (10 mg total) by mouth daily. Patient taking differently: Take 5 mg by mouth daily. 08/16/22   Elgergawy, Silver Huguenin, MD  FLUoxetine (PROZAC) 10 MG capsule  Take 10 mg by mouth daily.    [provider]  metoprolol tartrate (LOPRESSOR) 50 MG tablet Take 1 tablet (50 mg total) by mouth 2 (two) times daily. Patient taking differently: Take 25 mg by mouth 2 (two) times daily. 08/15/22   Elgergawy, Silver Huguenin, MD  QUEtiapine (SEROQUEL) 50 MG tablet Take 1 tablet (50 mg total) by mouth at bedtime. Patient not taking: Reported on 09/27/2022 08/15/22   Elgergawy, Silver Huguenin, MD  Rivaroxaban (XARELTO) 15 MG TABS tablet Take 1 tablet (15 mg total) by mouth daily with supper. 03/06/22   Deboraha Sprang, MD  senna-docusate (SENOKOT-S) 8.6-50 MG tablet Take 1 tablet by mouth at bedtime as needed for mild constipation. 10/02/22   Mercy Riding, MD  valsartan (DIOVAN) 320 MG tablet Take 1 tablet (320 mg total) by mouth daily. 10/02/22   Mercy Riding, MD      Allergies    Hydrochlorothiazide, Penicillins, Ciprofloxacin, Doxycycline, Hydrocodone-acetaminophen, Iodinated contrast media, Iodine-131, Latex, Statins, Carbamazepine, Codeine, Nickel, Sulfa antibiotics, Sulfamethoxazole, and Sulfonamide derivatives    Review of Systems   Review of Systems  Physical Exam Updated Vital Signs BP (!) 192/115   Pulse 84   Temp 97.9 F (36.6 C)   Resp 16   Ht 5' 5"$  (1.651 m)   Wt 60 kg   SpO2 95%  BMI 22.01 kg/m  Physical Exam Constitutional: Alert and orientedx3 person place and time just not situation, pleasantly demented Eyes: Conjunctivae are normal. ENT      Head: Normocephalic and atraumatic.      Nose: No congestion.      Mouth/Throat: Mucous membranes are moist.      Neck: No stridor.  No midline tenderness step-off or deformity of C/T/L-spine Cardiovascular: S1, S2,  Normal and symmetric distal pulses are present in all extremities.Warm and well perfused. Respiratory: Decreased breath sounds at the right midlung and lower lung.  O2 sat 95 on RA. Gastrointestinal: Soft and nontender. Musculoskeletal: Normal range of motion in all extremities.       Right lower leg: Old ecchymoses of the right shin and right foot.  Pitting edema nontender      Left lower leg: Nontender pitting edema Neurologic: Pleasantly demented.  Moving all extremities equally.  Sensation grossly intact.  No facial droop.  No gross focal neurologic deficits are appreciated. Skin: Old ecchymoses of the extremities noted Psychiatric: Mood and affect are normal. Speech and behavior are normal.  ED Results / Procedures / Treatments   Labs (all labs ordered are listed, but only abnormal results are displayed) Labs Reviewed  COMPREHENSIVE METABOLIC PANEL - Abnormal; Notable for the following components:      Result Value   Sodium 133 (*)    Glucose, Bld 122 (*)    Creatinine, Ser 1.21 (*)    Total Protein 5.9 (*)    Albumin 3.3 (*)    GFR, Estimated 44 (*)    All other components within normal limits  CBC WITH DIFFERENTIAL/PLATELET - Abnormal; Notable for the following components:   RBC 3.34 (*)    Hemoglobin 10.7 (*)    HCT 33.0 (*)    All other components within normal limits  PROTIME-INR - Abnormal; Notable for the following components:   Prothrombin Time 24.8 (*)    INR 2.3 (*)    All other components within normal limits  I-STAT CHEM 8, ED - Abnormal; Notable for the following components:   Creatinine, Ser 1.20 (*)    Glucose, Bld 119 (*)    Calcium, Ion 1.10 (*)    Hemoglobin 11.2 (*)    HCT 33.0 (*)    All other components within normal limits  RESP PANEL BY RT-PCR (RSV, FLU A&B, COVID)  RVPGX2  CK  APTT  TYPE AND SCREEN    EKG None  Radiology CT Cervical Spine Wo Contrast  Result Date: 10/20/2022 CLINICAL DATA:  unwitnessed fall EXAM: CT CERVICAL SPINE WITHOUT CONTRAST TECHNIQUE: Multidetector CT imaging of the cervical spine was performed without intravenous contrast. Multiplanar CT image reconstructions were also generated. RADIATION DOSE REDUCTION: This exam was performed according to the departmental dose-optimization program which includes  automated exposure control, adjustment of the mA and/or kV according to patient size and/or use of iterative reconstruction technique. COMPARISON:  CT C-spine 10/03/2022 FINDINGS: Alignment: Normal. Skull base and vertebrae: Multilevel mild-to-moderate degenerative changes spine. No severe associated neural foraminal or central canal stenosis. No acute fracture. No aggressive appearing focal osseous lesion or focal pathologic process. Soft tissues and spinal canal: No prevertebral fluid or swelling. No visible canal hematoma. Upper chest: At least moderate volume right pleural effusion. Other: Atherosclerotic plaque of the aortic arch. Aortic arch measures up to at least 3.4 cm. The right brachiocephalic artery measures up to 1.6 cm. IMPRESSION: 1. No acute displaced fracture or traumatic listhesis of the cervical spine. 2.  At least moderate volume right pleural effusion. Please see separately dictated chest x-ray 10/20/2022. 3. Right brachiocephalic artery measures at the upper limits of normal (1.6 cm). 4. Aortic Atherosclerosis (ICD10-I70.0). Electronically Signed   By: Iven Finn M.D.   On: 10/20/2022 21:55   CT Head Wo Contrast  Result Date: 10/20/2022 CLINICAL DATA:  Unwitnessed fall EXAM: CT HEAD WITHOUT CONTRAST TECHNIQUE: Contiguous axial images were obtained from the base of the skull through the vertex without intravenous contrast. RADIATION DOSE REDUCTION: This exam was performed according to the departmental dose-optimization program which includes automated exposure control, adjustment of the mA and/or kV according to patient size and/or use of iterative reconstruction technique. COMPARISON:  Head CT 10/03/2022 FINDINGS: Brain: No evidence of acute infarction, hemorrhage, hydrocephalus, extra-axial collection or mass lesion/mass effect. There is mild diffuse atrophy. There is moderate periventricular and deep white matter hypodensity, likely chronic small vessel ischemic change. Vascular:  Atherosclerotic calcifications are present within the cavernous internal carotid arteries. Skull: Normal. Negative for fracture or focal lesion. Sinuses/Orbits: Chronic appearing opacities are again noted in the left maxillary sinus. No air-fluid levels. Orbits are within normal limits. Other: None. IMPRESSION: 1. No acute intracranial process. 2. Atrophy and chronic small vessel ischemic changes. Electronically Signed   By: Ronney Asters M.D.   On: 10/20/2022 21:54   DG Chest Portable 1 View  Result Date: 10/20/2022 CLINICAL DATA:  Polytrauma injury from unwitnessed fall. EXAM: RIGHT FOOT COMPLETE - 3+ VIEW; PORTABLE PELVIS 1-2 VIEWS; PORTABLE CHEST - 1 VIEW; RIGHT TIBIA AND FIBULA - 2 VIEW COMPARISON:  Portable chest films from 10/03/2022 and 10/01/2022, portable AP pelvis 10/03/2022. No prior right foot or right tib fib series. FINDINGS: CHEST AP PORTABLE AT 9:17 p.m.: Moderate-to-large right pleural effusion is again noted and appears to have increased, now extending to the level of the mid hilum. There is obscuration of the lower half of the right chest. Underlying pneumonic infiltrate could be hidden. Remaining lungs are clear with COPD. The heart is enlarged but unchanged. Central vascular markings are normal caliber. There is aortic tortuosity and calcification with stable mediastinum. Osteopenia and degenerative change thoracic spine.  No pneumothorax. PORTABLE AP PELVIS, SINGLE VIEW: There is osteopenia without evidence of displaced fractures of the proximal femurs, pelvis and sacrum. There is spurring at the SI joints, pubic symphysis. Again noted are mild-to-moderate right and mild left nonerosive bilateral hip DJD. There are enthesopathic changes of the pelvis. Advanced degenerative change in the visualized lower lumbar spine is noted. There are pelvic phleboliths. Comparison to the recent prior study reveals no significant interval change. AP LAT RIGHT TIBIA FIBULA SERIES IN 4 FILMS: There is mild  generalized soft tissue stranding edema throughout the right foreleg. This continues over the ankle and foot where the edema is moderate. Underlying bones show osteopenia without displaced fractures or primary bone lesion. RIGHT FOOT, ROUTINE THREE VIEWS: Diffuse moderate edema. There is osteopenia without evidence of fractures. There is mild hallux valgus with otherwise normal interosseous alignment. There is mild degenerative arthrosis in the midfoot and forefoot but no erosive arthropathy. Dorsal and plantar calcaneal enthesopathy is noted and appears noninflammatory. IMPRESSION: 1. Moderate-to-large right pleural effusion, appears increased from the recent prior study. Underlying pneumonic infiltrate could be hidden. 2. Cardiomegaly and aortic atherosclerosis. 3. Osteopenia and degenerative change without evidence of fractures of the AP pelvis, proximal femurs, right tibia fibula and right foot. 4. Soft tissue edema throughout the right foreleg and foot. 5. Pelvic and calcaneal enthesopathic  change. 6. Hallux valgus. Mild degenerative changes of the midfoot and forefoot. Electronically Signed   By: Telford Nab M.D.   On: 10/20/2022 21:54   DG Pelvis Portable  Result Date: 10/20/2022 CLINICAL DATA:  Polytrauma injury from unwitnessed fall. EXAM: RIGHT FOOT COMPLETE - 3+ VIEW; PORTABLE PELVIS 1-2 VIEWS; PORTABLE CHEST - 1 VIEW; RIGHT TIBIA AND FIBULA - 2 VIEW COMPARISON:  Portable chest films from 10/03/2022 and 10/01/2022, portable AP pelvis 10/03/2022. No prior right foot or right tib fib series. FINDINGS: CHEST AP PORTABLE AT 9:17 p.m.: Moderate-to-large right pleural effusion is again noted and appears to have increased, now extending to the level of the mid hilum. There is obscuration of the lower half of the right chest. Underlying pneumonic infiltrate could be hidden. Remaining lungs are clear with COPD. The heart is enlarged but unchanged. Central vascular markings are normal caliber. There is aortic  tortuosity and calcification with stable mediastinum. Osteopenia and degenerative change thoracic spine.  No pneumothorax. PORTABLE AP PELVIS, SINGLE VIEW: There is osteopenia without evidence of displaced fractures of the proximal femurs, pelvis and sacrum. There is spurring at the SI joints, pubic symphysis. Again noted are mild-to-moderate right and mild left nonerosive bilateral hip DJD. There are enthesopathic changes of the pelvis. Advanced degenerative change in the visualized lower lumbar spine is noted. There are pelvic phleboliths. Comparison to the recent prior study reveals no significant interval change. AP LAT RIGHT TIBIA FIBULA SERIES IN 4 FILMS: There is mild generalized soft tissue stranding edema throughout the right foreleg. This continues over the ankle and foot where the edema is moderate. Underlying bones show osteopenia without displaced fractures or primary bone lesion. RIGHT FOOT, ROUTINE THREE VIEWS: Diffuse moderate edema. There is osteopenia without evidence of fractures. There is mild hallux valgus with otherwise normal interosseous alignment. There is mild degenerative arthrosis in the midfoot and forefoot but no erosive arthropathy. Dorsal and plantar calcaneal enthesopathy is noted and appears noninflammatory. IMPRESSION: 1. Moderate-to-large right pleural effusion, appears increased from the recent prior study. Underlying pneumonic infiltrate could be hidden. 2. Cardiomegaly and aortic atherosclerosis. 3. Osteopenia and degenerative change without evidence of fractures of the AP pelvis, proximal femurs, right tibia fibula and right foot. 4. Soft tissue edema throughout the right foreleg and foot. 5. Pelvic and calcaneal enthesopathic change. 6. Hallux valgus. Mild degenerative changes of the midfoot and forefoot. Electronically Signed   By: Telford Nab M.D.   On: 10/20/2022 21:54   DG Foot Complete Right  Result Date: 10/20/2022 CLINICAL DATA:  Polytrauma injury from  unwitnessed fall. EXAM: RIGHT FOOT COMPLETE - 3+ VIEW; PORTABLE PELVIS 1-2 VIEWS; PORTABLE CHEST - 1 VIEW; RIGHT TIBIA AND FIBULA - 2 VIEW COMPARISON:  Portable chest films from 10/03/2022 and 10/01/2022, portable AP pelvis 10/03/2022. No prior right foot or right tib fib series. FINDINGS: CHEST AP PORTABLE AT 9:17 p.m.: Moderate-to-large right pleural effusion is again noted and appears to have increased, now extending to the level of the mid hilum. There is obscuration of the lower half of the right chest. Underlying pneumonic infiltrate could be hidden. Remaining lungs are clear with COPD. The heart is enlarged but unchanged. Central vascular markings are normal caliber. There is aortic tortuosity and calcification with stable mediastinum. Osteopenia and degenerative change thoracic spine.  No pneumothorax. PORTABLE AP PELVIS, SINGLE VIEW: There is osteopenia without evidence of displaced fractures of the proximal femurs, pelvis and sacrum. There is spurring at the SI joints, pubic symphysis. Again noted are  mild-to-moderate right and mild left nonerosive bilateral hip DJD. There are enthesopathic changes of the pelvis. Advanced degenerative change in the visualized lower lumbar spine is noted. There are pelvic phleboliths. Comparison to the recent prior study reveals no significant interval change. AP LAT RIGHT TIBIA FIBULA SERIES IN 4 FILMS: There is mild generalized soft tissue stranding edema throughout the right foreleg. This continues over the ankle and foot where the edema is moderate. Underlying bones show osteopenia without displaced fractures or primary bone lesion. RIGHT FOOT, ROUTINE THREE VIEWS: Diffuse moderate edema. There is osteopenia without evidence of fractures. There is mild hallux valgus with otherwise normal interosseous alignment. There is mild degenerative arthrosis in the midfoot and forefoot but no erosive arthropathy. Dorsal and plantar calcaneal enthesopathy is noted and appears  noninflammatory. IMPRESSION: 1. Moderate-to-large right pleural effusion, appears increased from the recent prior study. Underlying pneumonic infiltrate could be hidden. 2. Cardiomegaly and aortic atherosclerosis. 3. Osteopenia and degenerative change without evidence of fractures of the AP pelvis, proximal femurs, right tibia fibula and right foot. 4. Soft tissue edema throughout the right foreleg and foot. 5. Pelvic and calcaneal enthesopathic change. 6. Hallux valgus. Mild degenerative changes of the midfoot and forefoot. Electronically Signed   By: Telford Nab M.D.   On: 10/20/2022 21:54   DG Tibia/Fibula Right  Result Date: 10/20/2022 CLINICAL DATA:  Polytrauma injury from unwitnessed fall. EXAM: RIGHT FOOT COMPLETE - 3+ VIEW; PORTABLE PELVIS 1-2 VIEWS; PORTABLE CHEST - 1 VIEW; RIGHT TIBIA AND FIBULA - 2 VIEW COMPARISON:  Portable chest films from 10/03/2022 and 10/01/2022, portable AP pelvis 10/03/2022. No prior right foot or right tib fib series. FINDINGS: CHEST AP PORTABLE AT 9:17 p.m.: Moderate-to-large right pleural effusion is again noted and appears to have increased, now extending to the level of the mid hilum. There is obscuration of the lower half of the right chest. Underlying pneumonic infiltrate could be hidden. Remaining lungs are clear with COPD. The heart is enlarged but unchanged. Central vascular markings are normal caliber. There is aortic tortuosity and calcification with stable mediastinum. Osteopenia and degenerative change thoracic spine.  No pneumothorax. PORTABLE AP PELVIS, SINGLE VIEW: There is osteopenia without evidence of displaced fractures of the proximal femurs, pelvis and sacrum. There is spurring at the SI joints, pubic symphysis. Again noted are mild-to-moderate right and mild left nonerosive bilateral hip DJD. There are enthesopathic changes of the pelvis. Advanced degenerative change in the visualized lower lumbar spine is noted. There are pelvic phleboliths.  Comparison to the recent prior study reveals no significant interval change. AP LAT RIGHT TIBIA FIBULA SERIES IN 4 FILMS: There is mild generalized soft tissue stranding edema throughout the right foreleg. This continues over the ankle and foot where the edema is moderate. Underlying bones show osteopenia without displaced fractures or primary bone lesion. RIGHT FOOT, ROUTINE THREE VIEWS: Diffuse moderate edema. There is osteopenia without evidence of fractures. There is mild hallux valgus with otherwise normal interosseous alignment. There is mild degenerative arthrosis in the midfoot and forefoot but no erosive arthropathy. Dorsal and plantar calcaneal enthesopathy is noted and appears noninflammatory. IMPRESSION: 1. Moderate-to-large right pleural effusion, appears increased from the recent prior study. Underlying pneumonic infiltrate could be hidden. 2. Cardiomegaly and aortic atherosclerosis. 3. Osteopenia and degenerative change without evidence of fractures of the AP pelvis, proximal femurs, right tibia fibula and right foot. 4. Soft tissue edema throughout the right foreleg and foot. 5. Pelvic and calcaneal enthesopathic change. 6. Hallux valgus. Mild degenerative changes  of the midfoot and forefoot. Electronically Signed   By: Telford Nab M.D.   On: 10/20/2022 21:54    Procedures Procedures    Medications Ordered in ED Medications - No data to display  ED Course/ Medical Decision Making/ A&P Clinical Course as of 10/20/22 2343  Fri Oct 20, 2022  2341 Discussed case with Dr. Velia Meyer hospitalist will admit patient for continued observation and possibly repeat pulmonology/IR consult for recurrent worsening pleural effusion likely hemothorax and continue monitoring of H&H. [VB]    Clinical Course User Index [VB] Elgie Congo, MD   {                            Medical Decision Making  Tiffany Black is a 89 y.o. female.  With PMH of A-fib on Xarelto, dementia, CKD brought in by  EMS from [place] after being found on the ground from her bed for unwitnessed fall.  There was no visualized injuries on exam with EMS.  Of note they at first had noted she was febrile to 101 and tachycardic did not give any medications but on arrival to ED afebrile 36.6 C.  Patient had unwitnessed fall with unclear time on the ground.  On exam she is pleasantly demented but has no localizing neurologic deficits.  There are no evidence of acute traumatic injuries.  Due to unwitnessed fall, labs and imaging was obtained.  CT head reviewed by me no evidence of ICH.  CT C-spine without acute traumatic injury.  Pelvis plain film as well as right foot and right tibia/fibula without traumatic injury.  Chest x-ray obtained reviewed by me concerning for worsening right-sided pleural effusion.  Questionable associated pneumonia however less likely with no fever here and normal white blood cell count 6.1.  However I on chart review patient admitted in January for pleural effusion which was drained by IR concerning for hemothorax.  Being on Xarelto with new anemia hemoglobin 10.7 down from baseline 12.7 concern for reaccumulation of hemothorax.  She is without respiratory distress at this time but will reach out to hospitalist for observation, continued H&H trending and possible pulmonology/IR reconsult for redraining.  She was lost to follow-up based off notes.  Discussed case with Dr. Velia Meyer hospitalist will admit patient for continued observation and possibly repeat pulmonology/IR consult for recurrent worsening pleural effusion likely hemothorax and continue monitoring of H&H.   Amount and/or Complexity of Data Reviewed Labs: ordered. Radiology: ordered.    Final Clinical Impression(s) / ED Diagnoses Final diagnoses:  Anemia, unspecified type  Fall, initial encounter  Pleural effusion    Rx / DC Orders ED Discharge Orders     None         Elgie Congo, MD 10/20/22 [phone]

## 2022-10-20 NOTE — Progress Notes (Signed)
   10/20/22 2130  Spiritual Encounters  Type of Visit Initial  Care provided to: Patient  Referral source Trauma page  Reason for visit Trauma  OnCall Visit Yes  Spiritual Framework  Presenting Themes Community and relationships  Community/Connection Family  Patient Stress Factors Family relationships  Interventions  Spiritual Care Interventions Made Reflective listening;Prayer  Intervention Outcomes  Outcomes Reduced anxiety;Reduced isolation;Connection to spiritual care   Chaplain responded to Trauma page, pt was alert and asked for prayer for her adult children, which I provided.

## 2022-10-20 NOTE — H&P (Signed)
History and Physical      Tiffany Black [id number] DOB: 1934-06-26 DOA: 10/20/2022  PCP: Tiffany Organ., MD *** Patient coming from: home ***  I have personally briefly reviewed patient's old medical records in [place]  Chief Complaint: ***  HPI: Tiffany Black is a 89 y.o. female with medical history significant for *** who is admitted to [facility] on 10/20/2022 with *** after presenting from home*** to [facility] ED complaining of ***.    ***       ***   ED Course:  Vital signs in the ED were notable for the following: ***  Labs were notable for the following: ***  Per my interpretation, EKG in ED demonstrated the following:  ***  Imaging and additional notable ED work-up: ***  While in the ED, the following were administered: ***  Subsequently, the patient was admitted  ***  ***red    Review of Systems: As per HPI otherwise 10 point review of systems negative.   Past Medical History:  Diagnosis Date   Anxiety    Arthritis    Bulging of cervical intervertebral disc    Cardiomegaly    Cataracts, bilateral    Chronic neck pain    History of endometrial cancer    History of stroke    2015 and 2016 // carotid US 8/16: Bilateral 1-39 // carotid US 2/16: Bilateral ICA 1-39   HLD (hyperlipidemia)    Hx of echocardiogram    a. Echo (11/15): Mild LVH, EF 55-60%, normal wall motion, grade 2 diastolic dysfunction, mild LAE, normal RV function, PASP 29 mm Hg // echo 8/16:Mild concentric LVH, EF 55-60, normal wall motion, grade 2 diastolic dysfunction, mild LAE, PASP 39   Hypertension    Insomnia    Lung nodule    OA (osteoarthritis)    Pectus excavatum    Persistent atrial fibrillation (Tiffany Black)    Vertigo     Past Surgical History:  Procedure Laterality Date   ABDOMINAL HYSTERECTOMY  2011   CATARACT EXTRACTION, BILATERAL Bilateral 2009   CESAREAN SECTION  1975   IR THORACENTESIS ASP PLEURAL SPACE W/IMG GUIDE  09/28/2022   LAPAROSCOPIC  HYSTERECTOMY  2011    Social History:  reports that she has never smoked. She has never used smokeless tobacco. She reports that she does not drink alcohol and does not use drugs.   Allergies  Allergen Reactions   Hydrochlorothiazide Hives   Penicillins Hives    Has patient had a PCN reaction causing immediate rash, facial/tongue/throat swelling, SOB or lightheadedness with hypotension: Yes Has patient had a PCN reaction causing severe rash involving mucus membranes or skin necrosis: No Has patient had a PCN reaction that required hospitalization No Has patient had a PCN reaction occurring within the last 10 years: No If all of the above answers are "NO", then may proceed with Cephalosporin use.    Ciprofloxacin Other (See Comments) and Hypertension    Blood pressure issues- patient doesn't recall if it increased OR decreased it     Doxycycline Diarrhea and Nausea And Vomiting   Hydrocodone-Acetaminophen Other (See Comments)    Unknown reaction   Iodinated Contrast Media Diarrhea, Other (See Comments) and Hypertension    Elevated the B/P   Iodine-131 Other (See Comments) and Hypertension    Raised the blood pressure   Latex Itching   Statins Other (See Comments)    Muscle weakness   Carbamazepine Other (See Comments) and Rash    Flushed  blood pressure medication out of system   Codeine Nausea And Vomiting   Nickel Rash   Sulfa Antibiotics Hives and Rash   Sulfamethoxazole Hives and Rash   Sulfonamide Derivatives Hives and Rash         Family History  Problem Relation Age of Onset   Dementia Mother    Heart attack Father    Heart disease Father    Stroke Maternal Uncle    Hypertension Neg Hx     Family history reviewed and not pertinent ***   Prior to Admission medications   Medication Sig Start Date End Date Taking? Authorizing Provider  acetaminophen (TYLENOL) 500 MG tablet Take 2 tablets (1,000 mg total) by mouth every 8 (eight) hours for 5 days, THEN 2  tablets (1,000 mg total) every 8 (eight) hours as needed for up to 25 days. 10/02/22 11/01/22  Mercy Riding, MD  diclofenac Sodium (VOLTAREN) 1 % GEL Apply 2 g topically 4 (four) times daily. To right wrist 10/02/22   Mercy Riding, MD  felodipine (PLENDIL) 10 MG 24 hr tablet Take 1 tablet (10 mg total) by mouth daily. Patient taking differently: Take 5 mg by mouth daily. 08/16/22   Elgergawy, Silver Huguenin, MD  FLUoxetine (PROZAC) 10 MG capsule Take 10 mg by mouth daily.    [provider]  metoprolol tartrate (LOPRESSOR) 50 MG tablet Take 1 tablet (50 mg total) by mouth 2 (two) times daily. Patient taking differently: Take 25 mg by mouth 2 (two) times daily. 08/15/22   Elgergawy, Silver Huguenin, MD  QUEtiapine (SEROQUEL) 50 MG tablet Take 1 tablet (50 mg total) by mouth at bedtime. Patient not taking: Reported on 09/27/2022 08/15/22   Elgergawy, Silver Huguenin, MD  Rivaroxaban (XARELTO) 15 MG TABS tablet Take 1 tablet (15 mg total) by mouth daily with supper. 03/06/22   Deboraha Sprang, MD  senna-docusate (SENOKOT-S) 8.6-50 MG tablet Take 1 tablet by mouth at bedtime as needed for mild constipation. 10/02/22   Mercy Riding, MD  valsartan (DIOVAN) 320 MG tablet Take 1 tablet (320 mg total) by mouth daily. 10/02/22   Mercy Riding, MD     Objective    Physical Exam: Vitals:   10/20/22 2145 10/20/22 2324 10/20/22 2330 10/20/22 2340  BP:  (!) 192/115  (!) 192/115  Pulse: 70  84 84  Resp: 16   16  Temp:    97.9 F (36.6 C)  TempSrc:      SpO2: 95%  97% 95%  Weight:      Height:        General: appears to be stated age; alert, oriented Skin: warm, dry, no rash Head:  AT/ Mouth:  Oral mucosa membranes appear moist, normal dentition Neck: supple; trachea midline Heart:  RRR; did not appreciate any M/R/G Lungs: CTAB, did not appreciate any wheezes, rales, or rhonchi Abdomen: + BS; soft, ND, NT Vascular: 2+ pedal pulses b/l; 2+ radial pulses b/l Extremities: no peripheral edema, no muscle  wasting Neuro: strength and sensation intact in upper and lower extremities b/l ***   *** Neuro: 5/5 strength of the proximal and distal flexors and extensors of the upper and lower extremities bilaterally; sensation intact in upper and lower extremities b/l; cranial nerves II through XII grossly intact; no pronator drift; no evidence suggestive of slurred speech, dysarthria, or facial droop; Normal muscle tone. No tremors.  *** Neuro: In the setting of the patient's current mental status and associated inability to follow instructions,  unable to perform full neurologic exam at this time.  As such, assessment of strength, sensation, and cranial nerves is limited at this time. Patient noted to spontaneously move all 4 extremities. No tremors.  ***    Labs on Admission: I have personally reviewed following labs and imaging studies  CBC: Recent Labs  Lab 10/20/22 2101 10/20/22 2129  WBC 6.1  --   NEUTROABS 4.0  --   HGB 10.7* 11.2*  HCT 33.0* 33.0*  MCV 98.8  --   PLT 219  --    Basic Metabolic Panel: Recent Labs  Lab 10/20/22 2101 10/20/22 2129  NA 133* 136  K 3.5 3.5  CL 101 102  CO2 22  --   GLUCOSE 122* 119*  BUN 14 14  CREATININE 1.21* 1.20*  CALCIUM 8.9  --    GFR: Estimated Creatinine Clearance: 30.3 mL/min (A) (by C-G formula based on SCr of 1.2 mg/dL (H)). Liver Function Tests: Recent Labs  Lab 10/20/22 2101  AST 29  ALT 27  ALKPHOS 73  BILITOT 0.9  PROT 5.9*  ALBUMIN 3.3*   No results for input(s): "LIPASE", "AMYLASE" in the last 168 hours. No results for input(s): "AMMONIA" in the last 168 hours. Coagulation Profile: Recent Labs  Lab 10/20/22 2101  INR 2.3*   Cardiac Enzymes: Recent Labs  Lab 10/20/22 2101  CKTOTAL 55   BNP (last 3 results) No results for input(s): "PROBNP" in the last 8760 hours. HbA1C: No results for input(s): "HGBA1C" in the last 72 hours. CBG: No results for input(s): "GLUCAP" in the last 168 hours. Lipid  Profile: No results for input(s): "CHOL", "HDL", "LDLCALC", "TRIG", "CHOLHDL", "LDLDIRECT" in the last 72 hours. Thyroid Function Tests: No results for input(s): "TSH", "T4TOTAL", "FREET4", "T3FREE", "THYROIDAB" in the last 72 hours. Anemia Panel: No results for input(s): "VITAMINB12", "FOLATE", "FERRITIN", "TIBC", "IRON", "RETICCTPCT" in the last 72 hours. Urine analysis:    Component Value Date/Time   COLORURINE STRAW (A) 08/26/2022 2355   APPEARANCEUR CLEAR 08/26/2022 2355   LABSPEC 1.008 08/26/2022 2355   PHURINE 7.0 08/26/2022 2355   GLUCOSEU NEGATIVE 08/26/2022 2355   HGBUR NEGATIVE 08/26/2022 2355   BILIRUBINUR NEGATIVE 08/26/2022 2355   KETONESUR NEGATIVE 08/26/2022 2355   PROTEINUR NEGATIVE 08/26/2022 2355   UROBILINOGEN 0.2 05/22/2015 1950   NITRITE NEGATIVE 08/26/2022 2355   LEUKOCYTESUR NEGATIVE 08/26/2022 2355    Radiological Exams on Admission: CT Cervical Spine Wo Contrast  Result Date: 10/20/2022 CLINICAL DATA:  unwitnessed fall EXAM: CT CERVICAL SPINE WITHOUT CONTRAST TECHNIQUE: Multidetector CT imaging of the cervical spine was performed without intravenous contrast. Multiplanar CT image reconstructions were also generated. RADIATION DOSE REDUCTION: This exam was performed according to the departmental dose-optimization program which includes automated exposure control, adjustment of the mA and/or kV according to patient size and/or use of iterative reconstruction technique. COMPARISON:  CT C-spine 10/03/2022 FINDINGS: Alignment: Normal. Skull base and vertebrae: Multilevel mild-to-moderate degenerative changes spine. No severe associated neural foraminal or central canal stenosis. No acute fracture. No aggressive appearing focal osseous lesion or focal pathologic process. Soft tissues and spinal canal: No prevertebral fluid or swelling. No visible canal hematoma. Upper chest: At least moderate volume right pleural effusion. Other: Atherosclerotic plaque of the aortic arch.  Aortic arch measures up to at least 3.4 cm. The right brachiocephalic artery measures up to 1.6 cm. IMPRESSION: 1. No acute displaced fracture or traumatic listhesis of the cervical spine. 2. At least moderate volume right pleural effusion. Please see separately dictated  chest x-ray 10/20/2022. 3. Right brachiocephalic artery measures at the upper limits of normal (1.6 cm). 4. Aortic Atherosclerosis (ICD10-I70.0). Electronically Signed   By: Iven Finn M.D.   On: 10/20/2022 21:55   CT Head Wo Contrast  Result Date: 10/20/2022 CLINICAL DATA:  Unwitnessed fall EXAM: CT HEAD WITHOUT CONTRAST TECHNIQUE: Contiguous axial images were obtained from the base of the skull through the vertex without intravenous contrast. RADIATION DOSE REDUCTION: This exam was performed according to the departmental dose-optimization program which includes automated exposure control, adjustment of the mA and/or kV according to patient size and/or use of iterative reconstruction technique. COMPARISON:  Head CT 10/03/2022 FINDINGS: Brain: No evidence of acute infarction, hemorrhage, hydrocephalus, extra-axial collection or mass lesion/mass effect. There is mild diffuse atrophy. There is moderate periventricular and deep white matter hypodensity, likely chronic small vessel ischemic change. Vascular: Atherosclerotic calcifications are present within the cavernous internal carotid arteries. Skull: Normal. Negative for fracture or focal lesion. Sinuses/Orbits: Chronic appearing opacities are again noted in the left maxillary sinus. No air-fluid levels. Orbits are within normal limits. Other: None. IMPRESSION: 1. No acute intracranial process. 2. Atrophy and chronic small vessel ischemic changes. Electronically Signed   By: Ronney Asters M.D.   On: 10/20/2022 21:54   DG Chest Portable 1 View  Result Date: 10/20/2022 CLINICAL DATA:  Polytrauma injury from unwitnessed fall. EXAM: RIGHT FOOT COMPLETE - 3+ VIEW; PORTABLE PELVIS 1-2 VIEWS;  PORTABLE CHEST - 1 VIEW; RIGHT TIBIA AND FIBULA - 2 VIEW COMPARISON:  Portable chest films from 10/03/2022 and 10/01/2022, portable AP pelvis 10/03/2022. No prior right foot or right tib fib series. FINDINGS: CHEST AP PORTABLE AT 9:17 p.m.: Moderate-to-large right pleural effusion is again noted and appears to have increased, now extending to the level of the mid hilum. There is obscuration of the lower half of the right chest. Underlying pneumonic infiltrate could be hidden. Remaining lungs are clear with COPD. The heart is enlarged but unchanged. Central vascular markings are normal caliber. There is aortic tortuosity and calcification with stable mediastinum. Osteopenia and degenerative change thoracic spine.  No pneumothorax. PORTABLE AP PELVIS, SINGLE VIEW: There is osteopenia without evidence of displaced fractures of the proximal femurs, pelvis and sacrum. There is spurring at the SI joints, pubic symphysis. Again noted are mild-to-moderate right and mild left nonerosive bilateral hip DJD. There are enthesopathic changes of the pelvis. Advanced degenerative change in the visualized lower lumbar spine is noted. There are pelvic phleboliths. Comparison to the recent prior study reveals no significant interval change. AP LAT RIGHT TIBIA FIBULA SERIES IN 4 FILMS: There is mild generalized soft tissue stranding edema throughout the right foreleg. This continues over the ankle and foot where the edema is moderate. Underlying bones show osteopenia without displaced fractures or primary bone lesion. RIGHT FOOT, ROUTINE THREE VIEWS: Diffuse moderate edema. There is osteopenia without evidence of fractures. There is mild hallux valgus with otherwise normal interosseous alignment. There is mild degenerative arthrosis in the midfoot and forefoot but no erosive arthropathy. Dorsal and plantar calcaneal enthesopathy is noted and appears noninflammatory. IMPRESSION: 1. Moderate-to-large right pleural effusion, appears  increased from the recent prior study. Underlying pneumonic infiltrate could be hidden. 2. Cardiomegaly and aortic atherosclerosis. 3. Osteopenia and degenerative change without evidence of fractures of the AP pelvis, proximal femurs, right tibia fibula and right foot. 4. Soft tissue edema throughout the right foreleg and foot. 5. Pelvic and calcaneal enthesopathic change. 6. Hallux valgus. Mild degenerative changes of the midfoot and  forefoot. Electronically Signed   By: Telford Nab M.D.   On: 10/20/2022 21:54   DG Pelvis Portable  Result Date: 10/20/2022 CLINICAL DATA:  Polytrauma injury from unwitnessed fall. EXAM: RIGHT FOOT COMPLETE - 3+ VIEW; PORTABLE PELVIS 1-2 VIEWS; PORTABLE CHEST - 1 VIEW; RIGHT TIBIA AND FIBULA - 2 VIEW COMPARISON:  Portable chest films from 10/03/2022 and 10/01/2022, portable AP pelvis 10/03/2022. No prior right foot or right tib fib series. FINDINGS: CHEST AP PORTABLE AT 9:17 p.m.: Moderate-to-large right pleural effusion is again noted and appears to have increased, now extending to the level of the mid hilum. There is obscuration of the lower half of the right chest. Underlying pneumonic infiltrate could be hidden. Remaining lungs are clear with COPD. The heart is enlarged but unchanged. Central vascular markings are normal caliber. There is aortic tortuosity and calcification with stable mediastinum. Osteopenia and degenerative change thoracic spine.  No pneumothorax. PORTABLE AP PELVIS, SINGLE VIEW: There is osteopenia without evidence of displaced fractures of the proximal femurs, pelvis and sacrum. There is spurring at the SI joints, pubic symphysis. Again noted are mild-to-moderate right and mild left nonerosive bilateral hip DJD. There are enthesopathic changes of the pelvis. Advanced degenerative change in the visualized lower lumbar spine is noted. There are pelvic phleboliths. Comparison to the recent prior study reveals no significant interval change. AP LAT RIGHT  TIBIA FIBULA SERIES IN 4 FILMS: There is mild generalized soft tissue stranding edema throughout the right foreleg. This continues over the ankle and foot where the edema is moderate. Underlying bones show osteopenia without displaced fractures or primary bone lesion. RIGHT FOOT, ROUTINE THREE VIEWS: Diffuse moderate edema. There is osteopenia without evidence of fractures. There is mild hallux valgus with otherwise normal interosseous alignment. There is mild degenerative arthrosis in the midfoot and forefoot but no erosive arthropathy. Dorsal and plantar calcaneal enthesopathy is noted and appears noninflammatory. IMPRESSION: 1. Moderate-to-large right pleural effusion, appears increased from the recent prior study. Underlying pneumonic infiltrate could be hidden. 2. Cardiomegaly and aortic atherosclerosis. 3. Osteopenia and degenerative change without evidence of fractures of the AP pelvis, proximal femurs, right tibia fibula and right foot. 4. Soft tissue edema throughout the right foreleg and foot. 5. Pelvic and calcaneal enthesopathic change. 6. Hallux valgus. Mild degenerative changes of the midfoot and forefoot. Electronically Signed   By: Telford Nab M.D.   On: 10/20/2022 21:54   DG Foot Complete Right  Result Date: 10/20/2022 CLINICAL DATA:  Polytrauma injury from unwitnessed fall. EXAM: RIGHT FOOT COMPLETE - 3+ VIEW; PORTABLE PELVIS 1-2 VIEWS; PORTABLE CHEST - 1 VIEW; RIGHT TIBIA AND FIBULA - 2 VIEW COMPARISON:  Portable chest films from 10/03/2022 and 10/01/2022, portable AP pelvis 10/03/2022. No prior right foot or right tib fib series. FINDINGS: CHEST AP PORTABLE AT 9:17 p.m.: Moderate-to-large right pleural effusion is again noted and appears to have increased, now extending to the level of the mid hilum. There is obscuration of the lower half of the right chest. Underlying pneumonic infiltrate could be hidden. Remaining lungs are clear with COPD. The heart is enlarged but unchanged. Central  vascular markings are normal caliber. There is aortic tortuosity and calcification with stable mediastinum. Osteopenia and degenerative change thoracic spine.  No pneumothorax. PORTABLE AP PELVIS, SINGLE VIEW: There is osteopenia without evidence of displaced fractures of the proximal femurs, pelvis and sacrum. There is spurring at the SI joints, pubic symphysis. Again noted are mild-to-moderate right and mild left nonerosive bilateral hip DJD. There are  enthesopathic changes of the pelvis. Advanced degenerative change in the visualized lower lumbar spine is noted. There are pelvic phleboliths. Comparison to the recent prior study reveals no significant interval change. AP LAT RIGHT TIBIA FIBULA SERIES IN 4 FILMS: There is mild generalized soft tissue stranding edema throughout the right foreleg. This continues over the ankle and foot where the edema is moderate. Underlying bones show osteopenia without displaced fractures or primary bone lesion. RIGHT FOOT, ROUTINE THREE VIEWS: Diffuse moderate edema. There is osteopenia without evidence of fractures. There is mild hallux valgus with otherwise normal interosseous alignment. There is mild degenerative arthrosis in the midfoot and forefoot but no erosive arthropathy. Dorsal and plantar calcaneal enthesopathy is noted and appears noninflammatory. IMPRESSION: 1. Moderate-to-large right pleural effusion, appears increased from the recent prior study. Underlying pneumonic infiltrate could be hidden. 2. Cardiomegaly and aortic atherosclerosis. 3. Osteopenia and degenerative change without evidence of fractures of the AP pelvis, proximal femurs, right tibia fibula and right foot. 4. Soft tissue edema throughout the right foreleg and foot. 5. Pelvic and calcaneal enthesopathic change. 6. Hallux valgus. Mild degenerative changes of the midfoot and forefoot. Electronically Signed   By: Telford Nab M.D.   On: 10/20/2022 21:54   DG Tibia/Fibula Right  Result Date:  10/20/2022 CLINICAL DATA:  Polytrauma injury from unwitnessed fall. EXAM: RIGHT FOOT COMPLETE - 3+ VIEW; PORTABLE PELVIS 1-2 VIEWS; PORTABLE CHEST - 1 VIEW; RIGHT TIBIA AND FIBULA - 2 VIEW COMPARISON:  Portable chest films from 10/03/2022 and 10/01/2022, portable AP pelvis 10/03/2022. No prior right foot or right tib fib series. FINDINGS: CHEST AP PORTABLE AT 9:17 p.m.: Moderate-to-large right pleural effusion is again noted and appears to have increased, now extending to the level of the mid hilum. There is obscuration of the lower half of the right chest. Underlying pneumonic infiltrate could be hidden. Remaining lungs are clear with COPD. The heart is enlarged but unchanged. Central vascular markings are normal caliber. There is aortic tortuosity and calcification with stable mediastinum. Osteopenia and degenerative change thoracic spine.  No pneumothorax. PORTABLE AP PELVIS, SINGLE VIEW: There is osteopenia without evidence of displaced fractures of the proximal femurs, pelvis and sacrum. There is spurring at the SI joints, pubic symphysis. Again noted are mild-to-moderate right and mild left nonerosive bilateral hip DJD. There are enthesopathic changes of the pelvis. Advanced degenerative change in the visualized lower lumbar spine is noted. There are pelvic phleboliths. Comparison to the recent prior study reveals no significant interval change. AP LAT RIGHT TIBIA FIBULA SERIES IN 4 FILMS: There is mild generalized soft tissue stranding edema throughout the right foreleg. This continues over the ankle and foot where the edema is moderate. Underlying bones show osteopenia without displaced fractures or primary bone lesion. RIGHT FOOT, ROUTINE THREE VIEWS: Diffuse moderate edema. There is osteopenia without evidence of fractures. There is mild hallux valgus with otherwise normal interosseous alignment. There is mild degenerative arthrosis in the midfoot and forefoot but no erosive arthropathy. Dorsal and plantar  calcaneal enthesopathy is noted and appears noninflammatory. IMPRESSION: 1. Moderate-to-large right pleural effusion, appears increased from the recent prior study. Underlying pneumonic infiltrate could be hidden. 2. Cardiomegaly and aortic atherosclerosis. 3. Osteopenia and degenerative change without evidence of fractures of the AP pelvis, proximal femurs, right tibia fibula and right foot. 4. Soft tissue edema throughout the right foreleg and foot. 5. Pelvic and calcaneal enthesopathic change. 6. Hallux valgus. Mild degenerative changes of the midfoot and forefoot. Electronically Signed   By: Lanny Hurst  Chesser M.D.   On: 10/20/2022 21:54      Assessment/Plan   Principal Problem:   Acute anemia   ***       ***            ***             ***            ***            ***            ***             ***           ***           ***   ***  DVT prophylaxis: SCD's ***  Code Status: Full code*** Family Communication: none*** Disposition Plan: Per Rounding Team Consults called: none***;  Admission status: ***     I SPENT GREATER THAN 75 *** MINUTES IN CLINICAL CARE TIME/MEDICAL DECISION-MAKING IN COMPLETING THIS ADMISSION.      [place] DO [facility]  From [place]   10/20/2022, 11:51 PM   ***

## 2022-10-20 NOTE — ED Notes (Signed)
CT tech reported to RN that patient had bugs in her shoes. RN caught one in a specimen cup, suspected to be bed bug. Agricultural consultant and team made aware

## 2022-10-20 NOTE — ED Triage Notes (Signed)
Pt BIB [facility] from [facility] [address] after unwitnessed fall at her facility. Staff reports that they don't when she fell or from what she fell, med tech found her on the floor. VSS, NAD, no obvious injury.

## 2022-10-20 NOTE — ED Notes (Signed)
RN attempted to call facility x3 for report, no answer. Left HIPAA compliant voicemail requesting call back for report.

## 2022-10-21 DIAGNOSIS — D649 Anemia, unspecified: Secondary | ICD-10-CM | POA: Diagnosis not present

## 2022-10-21 LAB — APTT: aPTT: 40 seconds — ABNORMAL HIGH (ref 24–36)

## 2022-10-21 LAB — CBC WITH DIFFERENTIAL/PLATELET
Abs Immature Granulocytes: 0.02 10*3/uL (ref 0.00–0.07)
Basophils Absolute: 0.1 10*3/uL (ref 0.0–0.1)
Basophils Relative: 1 %
Eosinophils Absolute: 0.4 10*3/uL (ref 0.0–0.5)
Eosinophils Relative: 7 %
HCT: 33.3 % — ABNORMAL LOW (ref 36.0–46.0)
Hemoglobin: 10.8 g/dL — ABNORMAL LOW (ref 12.0–15.0)
Immature Granulocytes: 0 %
Lymphocytes Relative: 19 %
Lymphs Abs: 1.1 10*3/uL (ref 0.7–4.0)
MCH: 31.8 pg (ref 26.0–34.0)
MCHC: 32.4 g/dL (ref 30.0–36.0)
MCV: 97.9 fL (ref 80.0–100.0)
Monocytes Absolute: 0.6 10*3/uL (ref 0.1–1.0)
Monocytes Relative: 10 %
Neutro Abs: 3.6 10*3/uL (ref 1.7–7.7)
Neutrophils Relative %: 63 %
Platelets: 241 10*3/uL (ref 150–400)
RBC: 3.4 MIL/uL — ABNORMAL LOW (ref 3.87–5.11)
RDW: 14.8 % (ref 11.5–15.5)
WBC: 5.7 10*3/uL (ref 4.0–10.5)
nRBC: 0 % (ref 0.0–0.2)

## 2022-10-21 LAB — COMPREHENSIVE METABOLIC PANEL
ALT: 23 U/L (ref 0–44)
AST: 24 U/L (ref 15–41)
Albumin: 3.2 g/dL — ABNORMAL LOW (ref 3.5–5.0)
Alkaline Phosphatase: 73 U/L (ref 38–126)
Anion gap: 9 (ref 5–15)
BUN: 10 mg/dL (ref 8–23)
CO2: 24 mmol/L (ref 22–32)
Calcium: 8.9 mg/dL (ref 8.9–10.3)
Chloride: 102 mmol/L (ref 98–111)
Creatinine, Ser: 1.05 mg/dL — ABNORMAL HIGH (ref 0.44–1.00)
GFR, Estimated: 52 mL/min — ABNORMAL LOW (ref >60)
Glucose, Bld: 91 mg/dL (ref 70–99)
Potassium: 3.5 mmol/L (ref 3.5–5.1)
Sodium: 135 mmol/L (ref 135–145)
Total Bilirubin: 1.2 mg/dL (ref 0.3–1.2)
Total Protein: 5.7 g/dL — ABNORMAL LOW (ref 6.5–8.1)

## 2022-10-21 LAB — PROCALCITONIN: Procalcitonin: <0.1 ng/mL

## 2022-10-21 LAB — RETICULOCYTES
Immature Retic Fract: 11.3 % (ref 2.3–15.9)
RBC.: 3.32 MIL/uL — ABNORMAL LOW (ref 3.87–5.11)
Retic Count, Absolute: 78 10*3/uL (ref 19.0–186.0)
Retic Ct Pct: 2.4 % (ref 0.4–3.1)

## 2022-10-21 LAB — VITAMIN B12: Vitamin B-12: 216 pg/mL (ref 180–914)

## 2022-10-21 LAB — URINALYSIS, COMPLETE (UACMP) WITH MICROSCOPIC
Bilirubin Urine: NEGATIVE
Glucose, UA: NEGATIVE mg/dL
Hgb urine dipstick: NEGATIVE
Ketones, ur: NEGATIVE mg/dL
Leukocytes,Ua: NEGATIVE
Nitrite: NEGATIVE
Protein, ur: NEGATIVE mg/dL
Specific Gravity, Urine: 1.01 (ref 1.005–1.030)
pH: 6 (ref 5.0–8.0)

## 2022-10-21 LAB — CREATININE, URINE, RANDOM: Creatinine, Urine: 67 mg/dL

## 2022-10-21 LAB — MAGNESIUM: Magnesium: 1.9 mg/dL (ref 1.7–2.4)

## 2022-10-21 LAB — IRON AND TIBC
Iron: 71 ug/dL (ref 28–170)
Saturation Ratios: 20 % (ref 10.4–31.8)
TIBC: 361 ug/dL (ref 250–450)
UIBC: 290 ug/dL

## 2022-10-21 LAB — RESP PANEL BY RT-PCR (RSV, FLU A&B, COVID)  RVPGX2
Influenza A by PCR: NEGATIVE
Influenza B by PCR: NEGATIVE
Resp Syncytial Virus by PCR: NEGATIVE
SARS Coronavirus 2 by RT PCR: NEGATIVE

## 2022-10-21 LAB — URIC ACID: Uric Acid, Serum: 4.7 mg/dL (ref 2.5–7.1)

## 2022-10-21 LAB — FERRITIN: Ferritin: 120 ng/mL (ref 11–307)

## 2022-10-21 LAB — PROTIME-INR
INR: 1.4 — ABNORMAL HIGH (ref 0.8–1.2)
Prothrombin Time: 16.9 seconds — ABNORMAL HIGH (ref 11.4–15.2)

## 2022-10-21 LAB — TSH: TSH: 1.704 u[IU]/mL (ref 0.350–4.500)

## 2022-10-21 LAB — TYPE AND SCREEN
ABO/RH(D): A POS
Antibody Screen: NEGATIVE

## 2022-10-21 LAB — OSMOLALITY: Osmolality: 282 mOsm/kg (ref 275–295)

## 2022-10-21 LAB — OSMOLALITY, URINE: Osmolality, Ur: 356 mOsm/kg (ref 300–900)

## 2022-10-21 LAB — SODIUM, URINE, RANDOM: Sodium, Ur: 77 mmol/L

## 2022-10-21 MED ORDER — FELODIPINE ER 5 MG PO TB24
5.0000 mg | ORAL_TABLET | Freq: Every day | ORAL | Status: DC
  Administered 2022-10-21 – 2022-10-22 (×2): 5 mg via ORAL
  Filled 2022-10-21 (×4): qty 1

## 2022-10-21 MED ORDER — METOPROLOL TARTRATE 25 MG PO TABS
25.0000 mg | ORAL_TABLET | Freq: Two times a day (BID) | ORAL | Status: DC
  Administered 2022-10-21 (×3): 25 mg via ORAL
  Filled 2022-10-21 (×5): qty 1

## 2022-10-21 NOTE — Progress Notes (Signed)
PROGRESS NOTE    Tiffany Black  [id number] DOB: 1934-02-08 DOA: 10/20/2022 PCP: Ginger Organ., MD    Brief Narrative:  89 year old with dementia, persistent A-fib on Xarelto, generalized anxiety disorder, essential hypertension who was recently admitted with suspected pneumonia and right-sided pleural effusion with hemothorax and discharged back to nursing home presents to the emergency room with fall that was unwitnessed.  Skeletal survey was negative.  Patient was stable and was on room air.  Repeat chest x-ray was done that showed increasing right-sided pleural effusion so admitted.  It was thought that she is having blood loss, however her hemoglobin is at her baseline.  Significant memory issues and impulsiveness.   Assessment & Plan:   Recurrent right-sided pleural effusion: Recent admission 1/17-1/23 showed hemothorax and relieved with thoracentesis.  Negative cultures. Cytology with reactive mesothelial cells.  No malignant cells found. Patient has reaccumulation of right-sided pleural effusion now.  Minimally symptomatic.  Previous CT scan with multiple nodules and was planning to have outpatient thoracentesis and repeat cytology. IR consulted for repeat thoracentesis today.  Will need fluid analysis and cytology. She is a scheduled to be seen by pulmonary in follow-up Monday 2/12.  Suspected acute anemia: Recent baseline hemoglobin 11.  Hemoglobin 10.7.  This is all anticipated from multiple procedures.  Persistent A-fib: CHA2DS2-VASc score of 6.  Chronic anticoagulation on Xarelto.  Hold Xarelto today.  She is on metoprolol 25 mg twice daily.  Rate controlled.  No change in therapy is needed.  Depends upon thoracentesis finding today, will resume Xarelto.  Benefits outweigh risk.  Generalized anxiety disorder, dementia: On Prozac.  Essential hypertension: Stable.   DVT prophylaxis: SCDs Start: 10/20/22 2349   Code Status: Full code Family Communication: Son Neiva Maini on the phone Disposition Plan: Status is: Observation The patient remains OBS appropriate and will d/c before 2 midnights. Potential discharge back to nursing home after procedure today.   Consultants:  IR  Procedures:  Thoracentesis, planned  Antimicrobials:  None   Subjective: Seen in the morning rounds.  Pleasant.  Trying to get out of the bed and walk around.  Poor historian.  Denies any complaints.  Denies any chest pain or shortness of breath.  Afebrile.  Nursing reported impulsiveness.  Objective: Vitals:   10/21/22 0112 10/21/22 0415 10/21/22 0446 10/21/22 0906  BP: (!) 145/96 (!) 182/103 (!) 177/97 (!) 170/115  Pulse: 100  83   Resp: 18 20 20 19  $ Temp: 97.6 F (36.4 C) (!) 97.4 F (36.3 C) 97.6 F (36.4 C) (!) 97.3 F (36.3 C)  TempSrc: Oral Oral Oral Oral  SpO2: 92% 94% 93%   Weight: 65.3 kg     Height: 5' 5"$  (1.651 m)       Intake/Output Summary (Last 24 hours) at 10/21/2022 1059 Last data filed at 10/21/2022 0953 Gross per 24 hour  Intake 288 ml  Output 100 ml  Net 188 ml   Filed Weights   10/20/22 2103 10/21/22 0112  Weight: 60 kg 65.3 kg    Examination:  General exam: Appears calm and comfortable  Alert and awake.  Oriented to herself.  Not oriented to time and place. Respiratory system: No air entry on the right base.  Respiratory effort normal.  On room air. Cardiovascular system: S1 & S2 heard, RRR. No pedal edema. Gastrointestinal system: Abdomen is nondistended, soft and nontender. No organomegaly or masses felt. Normal bowel sounds heard.   Data Reviewed: I have personally reviewed following labs and  imaging studies  CBC: Recent Labs  Lab 10/20/22 2101 10/20/22 2129 10/21/22 0632  WBC 6.1  --  5.7  NEUTROABS 4.0  --  3.6  HGB 10.7* 11.2* 10.8*  HCT 33.0* 33.0* 33.3*  MCV 98.8  --  97.9  PLT 219  --  [id number]   Basic Metabolic Panel: Recent Labs  Lab 10/20/22 2101 10/20/22 2129 10/21/22 0632  NA 133* 136 135  K 3.5 3.5  3.5  CL 101 102 102  CO2 22  --  24  GLUCOSE 122* 119* 91  BUN 14 14 10  $ CREATININE 1.21* 1.20* 1.05*  CALCIUM 8.9  --  8.9  MG  --   --  1.9   GFR: Estimated Creatinine Clearance: 34.6 mL/min (A) (by C-G formula based on SCr of 1.05 mg/dL (H)). Liver Function Tests: Recent Labs  Lab 10/20/22 2101 10/21/22 0632  AST 29 24  ALT 27 23  ALKPHOS 73 73  BILITOT 0.9 1.2  PROT 5.9* 5.7*  ALBUMIN 3.3* 3.2*   No results for input(s): "LIPASE", "AMYLASE" in the last 168 hours. No results for input(s): "AMMONIA" in the last 168 hours. Coagulation Profile: Recent Labs  Lab 10/20/22 2101 10/21/22 0632  INR 2.3* 1.4*   Cardiac Enzymes: Recent Labs  Lab 10/20/22 2101  CKTOTAL 55   BNP (last 3 results) No results for input(s): "PROBNP" in the last 8760 hours. HbA1C: No results for input(s): "HGBA1C" in the last 72 hours. CBG: No results for input(s): "GLUCAP" in the last 168 hours. Lipid Profile: No results for input(s): "CHOL", "HDL", "LDLCALC", "TRIG", "CHOLHDL", "LDLDIRECT" in the last 72 hours. Thyroid Function Tests: Recent Labs    10/21/22 0632  TSH 1.704   Anemia Panel: Recent Labs    10/21/22 0632  VITAMINB12 216  FERRITIN 120  TIBC 361  IRON 71  RETICCTPCT 2.4   Sepsis Labs: Recent Labs  Lab 10/21/22 [id number]  PROCALCITON <0.10    Recent Results (from the past 240 hour(s))  Resp panel by RT-PCR (RSV, Flu A&B, Covid) Anterior Nasal Swab     Status: None   Collection Time: 10/20/22  9:01 PM   Specimen: Anterior Nasal Swab  Result Value Ref Range Status   SARS Coronavirus 2 by RT PCR NEGATIVE NEGATIVE Final   Influenza A by PCR NEGATIVE NEGATIVE Final   Influenza B by PCR NEGATIVE NEGATIVE Final    Comment: (NOTE) The Xpert Xpress SARS-CoV-2/FLU/RSV plus assay is intended as an aid in the diagnosis of influenza from Nasopharyngeal swab specimens and should not be used as a sole basis for treatment. Nasal washings and aspirates are unacceptable for  Xpert Xpress SARS-CoV-2/FLU/RSV testing.  Fact Sheet for Patients: EntrepreneurPulse.com.au  Fact Sheet for Healthcare Providers: IncredibleEmployment.be  This test is not yet approved or cleared by the [place] FDA and has been authorized for detection and/or diagnosis of SARS-CoV-2 by FDA under an Emergency Use Authorization (EUA). This EUA will remain in effect (meaning this test can be used) for the duration of the COVID-19 declaration under Section 564(b)(1) of the Act, 21 U.S.C. section 360bbb-3(b)(1), unless the authorization is terminated or revoked.     Resp Syncytial Virus by PCR NEGATIVE NEGATIVE Final    Comment: (NOTE) Fact Sheet for Patients: EntrepreneurPulse.com.au  Fact Sheet for Healthcare Providers: IncredibleEmployment.be  This test is not yet approved or cleared by the [place] FDA and has been authorized for detection and/or diagnosis of SARS-CoV-2 by FDA under an Emergency Use Authorization (EUA). This  EUA will remain in effect (meaning this test can be used) for the duration of the COVID-19 declaration under Section 564(b)(1) of the Act, 21 U.S.C. section 360bbb-3(b)(1), unless the authorization is terminated or revoked.  Performed at [facility], [place] [address] [place],  [address]          Radiology Studies: CT Cervical Spine Wo Contrast  Result Date: 10/20/2022 CLINICAL DATA:  unwitnessed fall EXAM: CT CERVICAL SPINE WITHOUT CONTRAST TECHNIQUE: Multidetector CT imaging of the cervical spine was performed without intravenous contrast. Multiplanar CT image reconstructions were also generated. RADIATION DOSE REDUCTION: This exam was performed according to the departmental dose-optimization program which includes automated exposure control, adjustment of the mA and/or kV according to patient size and/or use of iterative reconstruction technique. COMPARISON:   CT C-spine 10/03/2022 FINDINGS: Alignment: Normal. Skull base and vertebrae: Multilevel mild-to-moderate degenerative changes spine. No severe associated neural foraminal or central canal stenosis. No acute fracture. No aggressive appearing focal osseous lesion or focal pathologic process. Soft tissues and spinal canal: No prevertebral fluid or swelling. No visible canal hematoma. Upper chest: At least moderate volume right pleural effusion. Other: Atherosclerotic plaque of the aortic arch. Aortic arch measures up to at least 3.4 cm. The right brachiocephalic artery measures up to 1.6 cm. IMPRESSION: 1. No acute displaced fracture or traumatic listhesis of the cervical spine. 2. At least moderate volume right pleural effusion. Please see separately dictated chest x-ray 10/20/2022. 3. Right brachiocephalic artery measures at the upper limits of normal (1.6 cm). 4. Aortic Atherosclerosis (ICD10-I70.0). Electronically Signed   By: Iven Finn M.D.   On: 10/20/2022 21:55   CT Head Wo Contrast  Result Date: 10/20/2022 CLINICAL DATA:  Unwitnessed fall EXAM: CT HEAD WITHOUT CONTRAST TECHNIQUE: Contiguous axial images were obtained from the base of the skull through the vertex without intravenous contrast. RADIATION DOSE REDUCTION: This exam was performed according to the departmental dose-optimization program which includes automated exposure control, adjustment of the mA and/or kV according to patient size and/or use of iterative reconstruction technique. COMPARISON:  Head CT 10/03/2022 FINDINGS: Brain: No evidence of acute infarction, hemorrhage, hydrocephalus, extra-axial collection or mass lesion/mass effect. There is mild diffuse atrophy. There is moderate periventricular and deep white matter hypodensity, likely chronic small vessel ischemic change. Vascular: Atherosclerotic calcifications are present within the cavernous internal carotid arteries. Skull: Normal. Negative for fracture or focal lesion.  Sinuses/Orbits: Chronic appearing opacities are again noted in the left maxillary sinus. No air-fluid levels. Orbits are within normal limits. Other: None. IMPRESSION: 1. No acute intracranial process. 2. Atrophy and chronic small vessel ischemic changes. Electronically Signed   By: Ronney Asters M.D.   On: 10/20/2022 21:54   DG Chest Portable 1 View  Result Date: 10/20/2022 CLINICAL DATA:  Polytrauma injury from unwitnessed fall. EXAM: RIGHT FOOT COMPLETE - 3+ VIEW; PORTABLE PELVIS 1-2 VIEWS; PORTABLE CHEST - 1 VIEW; RIGHT TIBIA AND FIBULA - 2 VIEW COMPARISON:  Portable chest films from 10/03/2022 and 10/01/2022, portable AP pelvis 10/03/2022. No prior right foot or right tib fib series. FINDINGS: CHEST AP PORTABLE AT 9:17 p.m.: Moderate-to-large right pleural effusion is again noted and appears to have increased, now extending to the level of the mid hilum. There is obscuration of the lower half of the right chest. Underlying pneumonic infiltrate could be hidden. Remaining lungs are clear with COPD. The heart is enlarged but unchanged. Central vascular markings are normal caliber. There is aortic tortuosity and calcification with stable mediastinum. Osteopenia  and degenerative change thoracic spine.  No pneumothorax. PORTABLE AP PELVIS, SINGLE VIEW: There is osteopenia without evidence of displaced fractures of the proximal femurs, pelvis and sacrum. There is spurring at the SI joints, pubic symphysis. Again noted are mild-to-moderate right and mild left nonerosive bilateral hip DJD. There are enthesopathic changes of the pelvis. Advanced degenerative change in the visualized lower lumbar spine is noted. There are pelvic phleboliths. Comparison to the recent prior study reveals no significant interval change. AP LAT RIGHT TIBIA FIBULA SERIES IN 4 FILMS: There is mild generalized soft tissue stranding edema throughout the right foreleg. This continues over the ankle and foot where the edema is moderate.  Underlying bones show osteopenia without displaced fractures or primary bone lesion. RIGHT FOOT, ROUTINE THREE VIEWS: Diffuse moderate edema. There is osteopenia without evidence of fractures. There is mild hallux valgus with otherwise normal interosseous alignment. There is mild degenerative arthrosis in the midfoot and forefoot but no erosive arthropathy. Dorsal and plantar calcaneal enthesopathy is noted and appears noninflammatory. IMPRESSION: 1. Moderate-to-large right pleural effusion, appears increased from the recent prior study. Underlying pneumonic infiltrate could be hidden. 2. Cardiomegaly and aortic atherosclerosis. 3. Osteopenia and degenerative change without evidence of fractures of the AP pelvis, proximal femurs, right tibia fibula and right foot. 4. Soft tissue edema throughout the right foreleg and foot. 5. Pelvic and calcaneal enthesopathic change. 6. Hallux valgus. Mild degenerative changes of the midfoot and forefoot. Electronically Signed   By: Telford Nab M.D.   On: 10/20/2022 21:54   DG Pelvis Portable  Result Date: 10/20/2022 CLINICAL DATA:  Polytrauma injury from unwitnessed fall. EXAM: RIGHT FOOT COMPLETE - 3+ VIEW; PORTABLE PELVIS 1-2 VIEWS; PORTABLE CHEST - 1 VIEW; RIGHT TIBIA AND FIBULA - 2 VIEW COMPARISON:  Portable chest films from 10/03/2022 and 10/01/2022, portable AP pelvis 10/03/2022. No prior right foot or right tib fib series. FINDINGS: CHEST AP PORTABLE AT 9:17 p.m.: Moderate-to-large right pleural effusion is again noted and appears to have increased, now extending to the level of the mid hilum. There is obscuration of the lower half of the right chest. Underlying pneumonic infiltrate could be hidden. Remaining lungs are clear with COPD. The heart is enlarged but unchanged. Central vascular markings are normal caliber. There is aortic tortuosity and calcification with stable mediastinum. Osteopenia and degenerative change thoracic spine.  No pneumothorax. PORTABLE AP  PELVIS, SINGLE VIEW: There is osteopenia without evidence of displaced fractures of the proximal femurs, pelvis and sacrum. There is spurring at the SI joints, pubic symphysis. Again noted are mild-to-moderate right and mild left nonerosive bilateral hip DJD. There are enthesopathic changes of the pelvis. Advanced degenerative change in the visualized lower lumbar spine is noted. There are pelvic phleboliths. Comparison to the recent prior study reveals no significant interval change. AP LAT RIGHT TIBIA FIBULA SERIES IN 4 FILMS: There is mild generalized soft tissue stranding edema throughout the right foreleg. This continues over the ankle and foot where the edema is moderate. Underlying bones show osteopenia without displaced fractures or primary bone lesion. RIGHT FOOT, ROUTINE THREE VIEWS: Diffuse moderate edema. There is osteopenia without evidence of fractures. There is mild hallux valgus with otherwise normal interosseous alignment. There is mild degenerative arthrosis in the midfoot and forefoot but no erosive arthropathy. Dorsal and plantar calcaneal enthesopathy is noted and appears noninflammatory. IMPRESSION: 1. Moderate-to-large right pleural effusion, appears increased from the recent prior study. Underlying pneumonic infiltrate could be hidden. 2. Cardiomegaly and aortic atherosclerosis. 3. Osteopenia and  degenerative change without evidence of fractures of the AP pelvis, proximal femurs, right tibia fibula and right foot. 4. Soft tissue edema throughout the right foreleg and foot. 5. Pelvic and calcaneal enthesopathic change. 6. Hallux valgus. Mild degenerative changes of the midfoot and forefoot. Electronically Signed   By: Telford Nab M.D.   On: 10/20/2022 21:54   DG Foot Complete Right  Result Date: 10/20/2022 CLINICAL DATA:  Polytrauma injury from unwitnessed fall. EXAM: RIGHT FOOT COMPLETE - 3+ VIEW; PORTABLE PELVIS 1-2 VIEWS; PORTABLE CHEST - 1 VIEW; RIGHT TIBIA AND FIBULA - 2 VIEW  COMPARISON:  Portable chest films from 10/03/2022 and 10/01/2022, portable AP pelvis 10/03/2022. No prior right foot or right tib fib series. FINDINGS: CHEST AP PORTABLE AT 9:17 p.m.: Moderate-to-large right pleural effusion is again noted and appears to have increased, now extending to the level of the mid hilum. There is obscuration of the lower half of the right chest. Underlying pneumonic infiltrate could be hidden. Remaining lungs are clear with COPD. The heart is enlarged but unchanged. Central vascular markings are normal caliber. There is aortic tortuosity and calcification with stable mediastinum. Osteopenia and degenerative change thoracic spine.  No pneumothorax. PORTABLE AP PELVIS, SINGLE VIEW: There is osteopenia without evidence of displaced fractures of the proximal femurs, pelvis and sacrum. There is spurring at the SI joints, pubic symphysis. Again noted are mild-to-moderate right and mild left nonerosive bilateral hip DJD. There are enthesopathic changes of the pelvis. Advanced degenerative change in the visualized lower lumbar spine is noted. There are pelvic phleboliths. Comparison to the recent prior study reveals no significant interval change. AP LAT RIGHT TIBIA FIBULA SERIES IN 4 FILMS: There is mild generalized soft tissue stranding edema throughout the right foreleg. This continues over the ankle and foot where the edema is moderate. Underlying bones show osteopenia without displaced fractures or primary bone lesion. RIGHT FOOT, ROUTINE THREE VIEWS: Diffuse moderate edema. There is osteopenia without evidence of fractures. There is mild hallux valgus with otherwise normal interosseous alignment. There is mild degenerative arthrosis in the midfoot and forefoot but no erosive arthropathy. Dorsal and plantar calcaneal enthesopathy is noted and appears noninflammatory. IMPRESSION: 1. Moderate-to-large right pleural effusion, appears increased from the recent prior study. Underlying pneumonic  infiltrate could be hidden. 2. Cardiomegaly and aortic atherosclerosis. 3. Osteopenia and degenerative change without evidence of fractures of the AP pelvis, proximal femurs, right tibia fibula and right foot. 4. Soft tissue edema throughout the right foreleg and foot. 5. Pelvic and calcaneal enthesopathic change. 6. Hallux valgus. Mild degenerative changes of the midfoot and forefoot. Electronically Signed   By: Telford Nab M.D.   On: 10/20/2022 21:54   DG Tibia/Fibula Right  Result Date: 10/20/2022 CLINICAL DATA:  Polytrauma injury from unwitnessed fall. EXAM: RIGHT FOOT COMPLETE - 3+ VIEW; PORTABLE PELVIS 1-2 VIEWS; PORTABLE CHEST - 1 VIEW; RIGHT TIBIA AND FIBULA - 2 VIEW COMPARISON:  Portable chest films from 10/03/2022 and 10/01/2022, portable AP pelvis 10/03/2022. No prior right foot or right tib fib series. FINDINGS: CHEST AP PORTABLE AT 9:17 p.m.: Moderate-to-large right pleural effusion is again noted and appears to have increased, now extending to the level of the mid hilum. There is obscuration of the lower half of the right chest. Underlying pneumonic infiltrate could be hidden. Remaining lungs are clear with COPD. The heart is enlarged but unchanged. Central vascular markings are normal caliber. There is aortic tortuosity and calcification with stable mediastinum. Osteopenia and degenerative change thoracic spine.  No  pneumothorax. PORTABLE AP PELVIS, SINGLE VIEW: There is osteopenia without evidence of displaced fractures of the proximal femurs, pelvis and sacrum. There is spurring at the SI joints, pubic symphysis. Again noted are mild-to-moderate right and mild left nonerosive bilateral hip DJD. There are enthesopathic changes of the pelvis. Advanced degenerative change in the visualized lower lumbar spine is noted. There are pelvic phleboliths. Comparison to the recent prior study reveals no significant interval change. AP LAT RIGHT TIBIA FIBULA SERIES IN 4 FILMS: There is mild generalized  soft tissue stranding edema throughout the right foreleg. This continues over the ankle and foot where the edema is moderate. Underlying bones show osteopenia without displaced fractures or primary bone lesion. RIGHT FOOT, ROUTINE THREE VIEWS: Diffuse moderate edema. There is osteopenia without evidence of fractures. There is mild hallux valgus with otherwise normal interosseous alignment. There is mild degenerative arthrosis in the midfoot and forefoot but no erosive arthropathy. Dorsal and plantar calcaneal enthesopathy is noted and appears noninflammatory. IMPRESSION: 1. Moderate-to-large right pleural effusion, appears increased from the recent prior study. Underlying pneumonic infiltrate could be hidden. 2. Cardiomegaly and aortic atherosclerosis. 3. Osteopenia and degenerative change without evidence of fractures of the AP pelvis, proximal femurs, right tibia fibula and right foot. 4. Soft tissue edema throughout the right foreleg and foot. 5. Pelvic and calcaneal enthesopathic change. 6. Hallux valgus. Mild degenerative changes of the midfoot and forefoot. Electronically Signed   By: Telford Nab M.D.   On: 10/20/2022 21:54        Scheduled Meds:  felodipine  5 mg Oral Daily   metoprolol tartrate  25 mg Oral BID   Continuous Infusions:   LOS: 0 days    Time spent: 35 minutes    Barb Merino, MD [facility] Pager [phone]

## 2022-10-21 NOTE — ED Notes (Addendum)
ED TO INPATIENT HANDOFF REPORT  ED Nurse Name and Phone #: Stanton Kidney [phone]  S Name/Age/Gender Tiffany Black 89 y.o. female Room/Bed: 009C/009C  Code Status   Code Status: Full Code  Home/SNF/Other Skilled nursing facility Patient oriented to: self Is this baseline? Yes   Triage Complete: Triage complete  Chief Complaint Acute anemia [D64.9]  Triage Note Pt BIB [facility] from [facility] [address] after unwitnessed fall at her facility. Staff reports that they don't when she fell or from what she fell, med tech found her on the floor. VSS, NAD, no obvious injury.    Allergies Allergies  Allergen Reactions   Hydrochlorothiazide Hives   Penicillins Hives    Has patient had a PCN reaction causing immediate rash, facial/tongue/throat swelling, SOB or lightheadedness with hypotension: Yes Has patient had a PCN reaction causing severe rash involving mucus membranes or skin necrosis: No Has patient had a PCN reaction that required hospitalization No Has patient had a PCN reaction occurring within the last 10 years: No If all of the above answers are "NO", then may proceed with Cephalosporin use.    Ciprofloxacin Other (See Comments) and Hypertension    Blood pressure issues- patient doesn't recall if it increased OR decreased it     Doxycycline Diarrhea and Nausea And Vomiting   Hydrocodone-Acetaminophen Other (See Comments)    Unknown reaction   Iodinated Contrast Media Diarrhea, Other (See Comments) and Hypertension    Elevated the B/P   Iodine-131 Other (See Comments) and Hypertension    Raised the blood pressure   Latex Itching   Statins Other (See Comments)    Muscle weakness   Carbamazepine Other (See Comments) and Rash    Flushed blood pressure medication out of system   Codeine Nausea And Vomiting   Nickel Rash   Sulfa Antibiotics Hives and Rash   Sulfamethoxazole Hives and Rash   Sulfonamide Derivatives Hives and Rash         Level of Care/Admitting  Diagnosis ED Disposition     ED Disposition  Admit   Condition  --   Comment  Hospital Area: [facility] [[id number]]  Level of Care: Progressive [102]  Admit to Progressive based on following criteria: MULTISYSTEM THREATS such as stable sepsis, metabolic/electrolyte imbalance with or without encephalopathy that is responding to early treatment.  May place patient in observation at [facility] or [facility] if equivalent level of care is available:: No  Covid Evaluation: Asymptomatic - no recent exposure (last 10 days) testing not required  Diagnosis: Acute anemia [id number]  Admitting Physician: Rhetta Mura [id number]  Attending Physician: Rhetta Mura [id number]          B Medical/Surgery History Past Medical History:  Diagnosis Date   Anxiety    Arthritis    Bulging of cervical intervertebral disc    Cardiomegaly    Cataracts, bilateral    Chronic neck pain    Dementia ([place])    Essential hypertension    History of endometrial cancer    History of stroke    2015 and 2016 // carotid US 8/16: Bilateral 1-39 // carotid US 2/16: Bilateral ICA 1-39   HLD (hyperlipidemia)    Hx of echocardiogram    a. Echo (11/15): Mild LVH, EF 55-60%, normal wall motion, grade 2 diastolic dysfunction, mild LAE, normal RV function, PASP 29 mm Hg // echo 8/16:Mild concentric LVH, EF 55-60, normal wall motion, grade 2 diastolic dysfunction, mild LAE, PASP 39   Hypertension  Insomnia    Lung nodule    OA (osteoarthritis)    Pectus excavatum    Persistent atrial fibrillation (HCC)    Vertigo    Past Surgical History:  Procedure Laterality Date   ABDOMINAL HYSTERECTOMY  2011   CATARACT EXTRACTION, BILATERAL Bilateral 2009   CESAREAN SECTION  1975   IR THORACENTESIS ASP PLEURAL SPACE W/IMG GUIDE  09/28/2022   LAPAROSCOPIC HYSTERECTOMY  2011     A IV Location/Drains/Wounds Patient Lines/Drains/Airways Status     Active Line/Drains/Airways     Name Placement  date Placement time Site Days   Peripheral IV 10/03/22 20 G Right Antecubital 10/03/22  2039  Antecubital  18            Intake/Output Last 24 hours No intake or output data in the 24 hours ending 10/21/22 0006  Labs/Imaging Results for orders placed or performed during the hospital encounter of 10/20/22 (from the past 48 hour(s))  Comprehensive metabolic panel     Status: Abnormal   Collection Time: 10/20/22  9:01 PM  Result Value Ref Range   Sodium 133 (L) 135 - 145 mmol/L   Potassium 3.5 3.5 - 5.1 mmol/L   Chloride 101 98 - 111 mmol/L   CO2 22 22 - 32 mmol/L   Glucose, Bld 122 (H) 70 - 99 mg/dL    Comment: Glucose reference range applies only to samples taken after fasting for at least 8 hours.   BUN 14 8 - 23 mg/dL   Creatinine, Ser 1.21 (H) 0.44 - 1.00 mg/dL   Calcium 8.9 8.9 - 10.3 mg/dL   Total Protein 5.9 (L) 6.5 - 8.1 g/dL   Albumin 3.3 (L) 3.5 - 5.0 g/dL   AST 29 15 - 41 U/L   ALT 27 0 - 44 U/L   Alkaline Phosphatase 73 38 - 126 U/L   Total Bilirubin 0.9 0.3 - 1.2 mg/dL   GFR, Estimated 44 (L) >60 mL/min    Comment: (NOTE) Calculated using the CKD-EPI Creatinine Equation (2021)    Anion gap 10 5 - 15    Comment: Performed at [facility], [place] [address] [place], [place] [address]  CBC with Differential     Status: Abnormal   Collection Time: 10/20/22  9:01 PM  Result Value Ref Range   WBC 6.1 4.0 - 10.5 K/uL   RBC 3.34 (L) 3.87 - 5.11 MIL/uL   Hemoglobin 10.7 (L) 12.0 - 15.0 g/dL   HCT 33.0 (L) 36.0 - 46.0 %   MCV 98.8 80.0 - 100.0 fL   MCH 32.0 26.0 - 34.0 pg   MCHC 32.4 30.0 - 36.0 g/dL   RDW 14.8 11.5 - 15.5 %   Platelets 219 150 - 400 K/uL   nRBC 0.0 0.0 - 0.2 %   Neutrophils Relative % 67 %   Neutro Abs 4.0 1.7 - 7.7 K/uL   Lymphocytes Relative 19 %   Lymphs Abs 1.2 0.7 - 4.0 K/uL   Monocytes Relative 8 %   Monocytes Absolute 0.5 0.1 - 1.0 K/uL   Eosinophils Relative 5 %   Eosinophils Absolute 0.3 0.0 - 0.5 K/uL   Basophils Relative 1 %    Basophils Absolute 0.1 0.0 - 0.1 K/uL   Immature Granulocytes 0 %   Abs Immature Granulocytes 0.01 0.00 - 0.07 K/uL    Comment: Performed at [facility] [address] [place], [place] [address]  Protime-INR     Status: Abnormal   Collection  Time: 10/20/22  9:01 PM  Result Value Ref Range   Prothrombin Time 24.8 (H) 11.4 - 15.2 seconds   INR 2.3 (H) 0.8 - 1.2    Comment: (NOTE) INR goal varies based on device and disease states. Performed at [facility], [place] [address] [place], [place] [address]   CK     Status: None   Collection Time: 10/20/22  9:01 PM  Result Value Ref Range   Total CK 55 38 - 234 U/L    Comment: Performed at [facility], [place] [address], [place], [place] [address]  I-stat chem 8, ED (not at [facility], [facility] or [facility])     Status: Abnormal   Collection Time: 10/20/22  9:29 PM  Result Value Ref Range   Sodium 136 135 - 145 mmol/L   Potassium 3.5 3.5 - 5.1 mmol/L   Chloride 102 98 - 111 mmol/L   BUN 14 8 - 23 mg/dL   Creatinine, Ser 1.20 (H) 0.44 - 1.00 mg/dL   Glucose, Bld 119 (H) 70 - 99 mg/dL    Comment: Glucose reference range applies only to samples taken after fasting for at least 8 hours.   Calcium, Ion 1.10 (L) 1.15 - 1.40 mmol/L   TCO2 25 22 - 32 mmol/L   Hemoglobin 11.2 (L) 12.0 - 15.0 g/dL   HCT 33.0 (L) 36.0 - 46.0 %   CT Cervical Spine Wo Contrast  Result Date: 10/20/2022 CLINICAL DATA:  unwitnessed fall EXAM: CT CERVICAL SPINE WITHOUT CONTRAST TECHNIQUE: Multidetector CT imaging of the cervical spine was performed without intravenous contrast. Multiplanar CT image reconstructions were also generated. RADIATION DOSE REDUCTION: This exam was performed according to the departmental dose-optimization program which includes automated exposure control, adjustment of the mA and/or kV according to patient size and/or use of iterative reconstruction technique. COMPARISON:  CT C-spine 10/03/2022 FINDINGS: Alignment: Normal. Skull base and  vertebrae: Multilevel mild-to-moderate degenerative changes spine. No severe associated neural foraminal or central canal stenosis. No acute fracture. No aggressive appearing focal osseous lesion or focal pathologic process. Soft tissues and spinal canal: No prevertebral fluid or swelling. No visible canal hematoma. Upper chest: At least moderate volume right pleural effusion. Other: Atherosclerotic plaque of the aortic arch. Aortic arch measures up to at least 3.4 cm. The right brachiocephalic artery measures up to 1.6 cm. IMPRESSION: 1. No acute displaced fracture or traumatic listhesis of the cervical spine. 2. At least moderate volume right pleural effusion. Please see separately dictated chest x-ray 10/20/2022. 3. Right brachiocephalic artery measures at the upper limits of normal (1.6 cm). 4. Aortic Atherosclerosis (ICD10-I70.0). Electronically Signed   By: Iven Finn M.D.   On: 10/20/2022 21:55   CT Head Wo Contrast  Result Date: 10/20/2022 CLINICAL DATA:  Unwitnessed fall EXAM: CT HEAD WITHOUT CONTRAST TECHNIQUE: Contiguous axial images were obtained from the base of the skull through the vertex without intravenous contrast. RADIATION DOSE REDUCTION: This exam was performed according to the departmental dose-optimization program which includes automated exposure control, adjustment of the mA and/or kV according to patient size and/or use of iterative reconstruction technique. COMPARISON:  Head CT 10/03/2022 FINDINGS: Brain: No evidence of acute infarction, hemorrhage, hydrocephalus, extra-axial collection or mass lesion/mass effect. There is mild diffuse atrophy. There is moderate periventricular and deep white matter hypodensity, likely chronic small vessel ischemic change. Vascular: Atherosclerotic calcifications are present within the cavernous internal carotid arteries. Skull: Normal. Negative for fracture or focal lesion. Sinuses/Orbits: Chronic appearing opacities are again  noted in the left  maxillary sinus. No air-fluid levels. Orbits are within normal limits. Other: None. IMPRESSION: 1. No acute intracranial process. 2. Atrophy and chronic small vessel ischemic changes. Electronically Signed   By: Ronney Asters M.D.   On: 10/20/2022 21:54   DG Chest Portable 1 View  Result Date: 10/20/2022 CLINICAL DATA:  Polytrauma injury from unwitnessed fall. EXAM: RIGHT FOOT COMPLETE - 3+ VIEW; PORTABLE PELVIS 1-2 VIEWS; PORTABLE CHEST - 1 VIEW; RIGHT TIBIA AND FIBULA - 2 VIEW COMPARISON:  Portable chest films from 10/03/2022 and 10/01/2022, portable AP pelvis 10/03/2022. No prior right foot or right tib fib series. FINDINGS: CHEST AP PORTABLE AT 9:17 p.m.: Moderate-to-large right pleural effusion is again noted and appears to have increased, now extending to the level of the mid hilum. There is obscuration of the lower half of the right chest. Underlying pneumonic infiltrate could be hidden. Remaining lungs are clear with COPD. The heart is enlarged but unchanged. Central vascular markings are normal caliber. There is aortic tortuosity and calcification with stable mediastinum. Osteopenia and degenerative change thoracic spine.  No pneumothorax. PORTABLE AP PELVIS, SINGLE VIEW: There is osteopenia without evidence of displaced fractures of the proximal femurs, pelvis and sacrum. There is spurring at the SI joints, pubic symphysis. Again noted are mild-to-moderate right and mild left nonerosive bilateral hip DJD. There are enthesopathic changes of the pelvis. Advanced degenerative change in the visualized lower lumbar spine is noted. There are pelvic phleboliths. Comparison to the recent prior study reveals no significant interval change. AP LAT RIGHT TIBIA FIBULA SERIES IN 4 FILMS: There is mild generalized soft tissue stranding edema throughout the right foreleg. This continues over the ankle and foot where the edema is moderate. Underlying bones show osteopenia without displaced fractures or primary bone  lesion. RIGHT FOOT, ROUTINE THREE VIEWS: Diffuse moderate edema. There is osteopenia without evidence of fractures. There is mild hallux valgus with otherwise normal interosseous alignment. There is mild degenerative arthrosis in the midfoot and forefoot but no erosive arthropathy. Dorsal and plantar calcaneal enthesopathy is noted and appears noninflammatory. IMPRESSION: 1. Moderate-to-large right pleural effusion, appears increased from the recent prior study. Underlying pneumonic infiltrate could be hidden. 2. Cardiomegaly and aortic atherosclerosis. 3. Osteopenia and degenerative change without evidence of fractures of the AP pelvis, proximal femurs, right tibia fibula and right foot. 4. Soft tissue edema throughout the right foreleg and foot. 5. Pelvic and calcaneal enthesopathic change. 6. Hallux valgus. Mild degenerative changes of the midfoot and forefoot. Electronically Signed   By: Telford Nab M.D.   On: 10/20/2022 21:54   DG Pelvis Portable  Result Date: 10/20/2022 CLINICAL DATA:  Polytrauma injury from unwitnessed fall. EXAM: RIGHT FOOT COMPLETE - 3+ VIEW; PORTABLE PELVIS 1-2 VIEWS; PORTABLE CHEST - 1 VIEW; RIGHT TIBIA AND FIBULA - 2 VIEW COMPARISON:  Portable chest films from 10/03/2022 and 10/01/2022, portable AP pelvis 10/03/2022. No prior right foot or right tib fib series. FINDINGS: CHEST AP PORTABLE AT 9:17 p.m.: Moderate-to-large right pleural effusion is again noted and appears to have increased, now extending to the level of the mid hilum. There is obscuration of the lower half of the right chest. Underlying pneumonic infiltrate could be hidden. Remaining lungs are clear with COPD. The heart is enlarged but unchanged. Central vascular markings are normal caliber. There is aortic tortuosity and calcification with stable mediastinum. Osteopenia and degenerative change thoracic spine.  No pneumothorax. PORTABLE AP PELVIS, SINGLE VIEW: There is osteopenia without evidence of displaced  fractures of the proximal femurs, pelvis and sacrum. There is spurring at the SI joints, pubic symphysis. Again noted are mild-to-moderate right and mild left nonerosive bilateral hip DJD. There are enthesopathic changes of the pelvis. Advanced degenerative change in the visualized lower lumbar spine is noted. There are pelvic phleboliths. Comparison to the recent prior study reveals no significant interval change. AP LAT RIGHT TIBIA FIBULA SERIES IN 4 FILMS: There is mild generalized soft tissue stranding edema throughout the right foreleg. This continues over the ankle and foot where the edema is moderate. Underlying bones show osteopenia without displaced fractures or primary bone lesion. RIGHT FOOT, ROUTINE THREE VIEWS: Diffuse moderate edema. There is osteopenia without evidence of fractures. There is mild hallux valgus with otherwise normal interosseous alignment. There is mild degenerative arthrosis in the midfoot and forefoot but no erosive arthropathy. Dorsal and plantar calcaneal enthesopathy is noted and appears noninflammatory. IMPRESSION: 1. Moderate-to-large right pleural effusion, appears increased from the recent prior study. Underlying pneumonic infiltrate could be hidden. 2. Cardiomegaly and aortic atherosclerosis. 3. Osteopenia and degenerative change without evidence of fractures of the AP pelvis, proximal femurs, right tibia fibula and right foot. 4. Soft tissue edema throughout the right foreleg and foot. 5. Pelvic and calcaneal enthesopathic change. 6. Hallux valgus. Mild degenerative changes of the midfoot and forefoot. Electronically Signed   By: Telford Nab M.D.   On: 10/20/2022 21:54   DG Foot Complete Right  Result Date: 10/20/2022 CLINICAL DATA:  Polytrauma injury from unwitnessed fall. EXAM: RIGHT FOOT COMPLETE - 3+ VIEW; PORTABLE PELVIS 1-2 VIEWS; PORTABLE CHEST - 1 VIEW; RIGHT TIBIA AND FIBULA - 2 VIEW COMPARISON:  Portable chest films from 10/03/2022 and 10/01/2022, portable AP  pelvis 10/03/2022. No prior right foot or right tib fib series. FINDINGS: CHEST AP PORTABLE AT 9:17 p.m.: Moderate-to-large right pleural effusion is again noted and appears to have increased, now extending to the level of the mid hilum. There is obscuration of the lower half of the right chest. Underlying pneumonic infiltrate could be hidden. Remaining lungs are clear with COPD. The heart is enlarged but unchanged. Central vascular markings are normal caliber. There is aortic tortuosity and calcification with stable mediastinum. Osteopenia and degenerative change thoracic spine.  No pneumothorax. PORTABLE AP PELVIS, SINGLE VIEW: There is osteopenia without evidence of displaced fractures of the proximal femurs, pelvis and sacrum. There is spurring at the SI joints, pubic symphysis. Again noted are mild-to-moderate right and mild left nonerosive bilateral hip DJD. There are enthesopathic changes of the pelvis. Advanced degenerative change in the visualized lower lumbar spine is noted. There are pelvic phleboliths. Comparison to the recent prior study reveals no significant interval change. AP LAT RIGHT TIBIA FIBULA SERIES IN 4 FILMS: There is mild generalized soft tissue stranding edema throughout the right foreleg. This continues over the ankle and foot where the edema is moderate. Underlying bones show osteopenia without displaced fractures or primary bone lesion. RIGHT FOOT, ROUTINE THREE VIEWS: Diffuse moderate edema. There is osteopenia without evidence of fractures. There is mild hallux valgus with otherwise normal interosseous alignment. There is mild degenerative arthrosis in the midfoot and forefoot but no erosive arthropathy. Dorsal and plantar calcaneal enthesopathy is noted and appears noninflammatory. IMPRESSION: 1. Moderate-to-large right pleural effusion, appears increased from the recent prior study. Underlying pneumonic infiltrate could be hidden. 2. Cardiomegaly and aortic atherosclerosis. 3.  Osteopenia and degenerative change without evidence of fractures of the AP pelvis, proximal femurs, right tibia fibula and right foot. 4.  Soft tissue edema throughout the right foreleg and foot. 5. Pelvic and calcaneal enthesopathic change. 6. Hallux valgus. Mild degenerative changes of the midfoot and forefoot. Electronically Signed   By: Telford Nab M.D.   On: 10/20/2022 21:54   DG Tibia/Fibula Right  Result Date: 10/20/2022 CLINICAL DATA:  Polytrauma injury from unwitnessed fall. EXAM: RIGHT FOOT COMPLETE - 3+ VIEW; PORTABLE PELVIS 1-2 VIEWS; PORTABLE CHEST - 1 VIEW; RIGHT TIBIA AND FIBULA - 2 VIEW COMPARISON:  Portable chest films from 10/03/2022 and 10/01/2022, portable AP pelvis 10/03/2022. No prior right foot or right tib fib series. FINDINGS: CHEST AP PORTABLE AT 9:17 p.m.: Moderate-to-large right pleural effusion is again noted and appears to have increased, now extending to the level of the mid hilum. There is obscuration of the lower half of the right chest. Underlying pneumonic infiltrate could be hidden. Remaining lungs are clear with COPD. The heart is enlarged but unchanged. Central vascular markings are normal caliber. There is aortic tortuosity and calcification with stable mediastinum. Osteopenia and degenerative change thoracic spine.  No pneumothorax. PORTABLE AP PELVIS, SINGLE VIEW: There is osteopenia without evidence of displaced fractures of the proximal femurs, pelvis and sacrum. There is spurring at the SI joints, pubic symphysis. Again noted are mild-to-moderate right and mild left nonerosive bilateral hip DJD. There are enthesopathic changes of the pelvis. Advanced degenerative change in the visualized lower lumbar spine is noted. There are pelvic phleboliths. Comparison to the recent prior study reveals no significant interval change. AP LAT RIGHT TIBIA FIBULA SERIES IN 4 FILMS: There is mild generalized soft tissue stranding edema throughout the right foreleg. This continues over  the ankle and foot where the edema is moderate. Underlying bones show osteopenia without displaced fractures or primary bone lesion. RIGHT FOOT, ROUTINE THREE VIEWS: Diffuse moderate edema. There is osteopenia without evidence of fractures. There is mild hallux valgus with otherwise normal interosseous alignment. There is mild degenerative arthrosis in the midfoot and forefoot but no erosive arthropathy. Dorsal and plantar calcaneal enthesopathy is noted and appears noninflammatory. IMPRESSION: 1. Moderate-to-large right pleural effusion, appears increased from the recent prior study. Underlying pneumonic infiltrate could be hidden. 2. Cardiomegaly and aortic atherosclerosis. 3. Osteopenia and degenerative change without evidence of fractures of the AP pelvis, proximal femurs, right tibia fibula and right foot. 4. Soft tissue edema throughout the right foreleg and foot. 5. Pelvic and calcaneal enthesopathic change. 6. Hallux valgus. Mild degenerative changes of the midfoot and forefoot. Electronically Signed   By: Telford Nab M.D.   On: 10/20/2022 21:54    Pending Labs Unresulted Labs (From admission, onward)     Start     Ordered   10/21/22 0900  Hemoglobin and hematocrit, blood  Once-Timed,   TIMED        10/20/22 2349   10/21/22 0500  CBC with Differential/Platelet  Tomorrow morning,   R        10/20/22 2349   10/21/22 0500  Comprehensive metabolic panel  Tomorrow morning,   R        10/20/22 2349   10/21/22 0500  Magnesium  Tomorrow morning,   R        10/20/22 2349   10/20/22 2350  Magnesium  Add-on,   AD        10/20/22 2349   10/20/22 2223  APTT  Once,   STAT        10/20/22 2222   10/20/22 2223  Type and screen [place]  Once,   STAT       Comments: [place]    10/20/22 2222   10/20/22 2101  Resp panel by RT-PCR (RSV, Flu A&B, Covid) Anterior Nasal Swab  (Resp panel by RT-PCR (RSV, Flu A&B, Covid))  Once,   URGENT        10/20/22 2102             Vitals/Pain Today's Vitals   10/20/22 2340 10/20/22 2340 10/20/22 2350 10/20/22 2351  BP:  (!) 192/115 (!) 152/92   Pulse:  84  76  Resp:  16    Temp:  97.9 F (36.6 C)    TempSrc:      SpO2:  95%  95%  Weight:      Height:      PainSc: 0-No pain 0-No pain      Isolation Precautions Airborne and Contact precautions  Medications Medications  acetaminophen (TYLENOL) tablet 650 mg (has no administration in time range)    Or  acetaminophen (TYLENOL) suppository 650 mg (has no administration in time range)  melatonin tablet 3 mg (has no administration in time range)    Mobility walks with person assist     Focused Assessments Cardiac Assessment Handoff:  Cardiac Rhythm: Sinus tachycardia Lab Results  Component Value Date   CKTOTAL 55 10/20/2022   TROPONINI <0.03 05/23/2015   Lab Results  Component Value Date   DDIMER 1.44 (H) 11/28/2021   Does the Patient currently have chest pain? No   , Neuro Assessment Handoff:  Swallow screen pass? Yes  Cardiac Rhythm: Sinus tachycardia       Neuro Assessment:   Neuro Checks:      Has TPA been given? No If patient is a Neuro Trauma and patient is going to OR before floor call report to [place] nurse: [phone] or [phone]   R Recommendations: See Admitting Provider Note  Report given to:   Additional Notes: dementia hx, BED BUGS!

## 2022-10-21 NOTE — ED Notes (Signed)
.  [employer]

## 2022-10-21 NOTE — Evaluation (Signed)
Physical Therapy Evaluation Patient Details Name: Tiffany Black MRN: [id number] DOB: 10/19/1933 Today's Date: 10/21/2022  History of Present Illness  Pt is 89 yo female admitted on 10/20/22 after fall with acute anemia and worsening R pleural effusion.  IR to be consulted for possible thoracentesis.  Pt with hx including dementia, afib, anxiety, HTN, recent admission for pleural effusion  Clinical Impression  Pt admitted with above diagnosis. Pt is poor historian and unable to provide PLOF.  However, per chart she is from memory care facility.  Today, pt was fearfull of falling but was able to ambulate  14' with RW and min A with encouragement.  Pt likely could have walked further but was fearful to leave room.  Unknown PLOF but pt is moving better than prior admission in January.  Will maintain on caseload to advance independence, decrease risk of falls, and decrease caregiver burden. Pt currently with functional limitations due to the deficits listed below (see PT Problem List). Pt will benefit from skilled PT to increase their independence and safety with mobility to allow discharge to the venue listed below.          Recommendations for follow up therapy are one component of a multi-disciplinary discharge planning process, led by the attending physician.  Recommendations may be updated based on patient status, additional functional criteria and insurance authorization.  Follow Up Recommendations Long-term institutional care without follow-up therapy (Return to memory care) Can patient physically be transported by private vehicle: Yes    Assistance Recommended at Discharge Frequent or constant Supervision/Assistance  Patient can return home with the following  A little help with walking and/or transfers;A little help with bathing/dressing/bathroom;Assistance with cooking/housework    Equipment Recommendations None recommended by PT  Recommendations for Other Services       Functional  Status Assessment Patient has had a recent decline in their functional status and demonstrates the ability to make significant improvements in function in a reasonable and predictable amount of time.     Precautions / Restrictions Precautions Precautions: Fall      Mobility  Bed Mobility Overal bed mobility: Needs Assistance Bed Mobility: Supine to Sit, Sit to Supine   Sidelying to sit: Min assist   Sit to supine: Min assist   General bed mobility comments: light min A for trunk to sit and for legs back to bed    Transfers Overall transfer level: Needs assistance Equipment used: Rolling walker (2 wheels) Transfers: Sit to/from Stand Sit to Stand: Min guard           General transfer comment: Performed x 2 with min guard for safety; cues for safe siting technique    Ambulation/Gait Ambulation/Gait assistance: Min assist Gait Distance (Feet): 14 Feet Assistive device: Rolling walker (2 wheels) Gait Pattern/deviations: Step-to pattern, Decreased stride length Gait velocity: reduced     General Gait Details: Small steps with min A for RW and min guard for balance.  Pt expressed fear of falling and was not willing to walk in hallway and requiried encouragement for ambulation in room.  Stairs            Wheelchair Mobility    Modified Rankin (Stroke Patients Only)       Balance Overall balance assessment: Needs assistance Sitting-balance support: No upper extremity supported Sitting balance-Leahy Scale: Good     Standing balance support: Bilateral upper extremity supported, Reliant on assistive device for balance Standing balance-Leahy Scale: Poor Standing balance comment: RW and min guard  Pertinent Vitals/Pain Pain Assessment Pain Assessment: No/denies pain    Home Living Family/patient expects to be discharged to:: Other (Comment) (memory care)                   Additional Comments: Pt from memory  care per chart    Prior Function Prior Level of Function : History of Falls (last six months);Patient poor historian/Family not available             Mobility Comments: reports no use of AD though fearful of falling (pt reports lives with son but per chart from memory care and pt does have locator around ankle) ADLs Comments: reports able to complete ADLs though unsure of baseline due to poor historian     Hand Dominance        Extremity/Trunk Assessment   Upper Extremity Assessment Upper Extremity Assessment: Difficult to assess due to impaired cognition;Generalized weakness    Lower Extremity Assessment Lower Extremity Assessment: Difficult to assess due to impaired cognition;Generalized weakness    Cervical / Trunk Assessment Cervical / Trunk Assessment: Kyphotic  Communication   Communication: No difficulties  Cognition Arousal/Alertness: Awake/alert Behavior During Therapy: Anxious Overall Cognitive Status: History of cognitive impairments - at baseline                                 General Comments: Pt oriented to self and place only.  She is fearful of falling.  Requiring cues for safety        General Comments General comments (skin integrity, edema, etc.): HR 80 to 103 bpm throughout session.  BP was elevated 165/104 but stable and RN in with BP meds    Exercises     Assessment/Plan    PT Assessment Patient needs continued PT services  PT Problem List Decreased strength;Decreased activity tolerance;Decreased mobility;Decreased balance;Decreased cognition;Decreased coordination;Decreased knowledge of use of DME;Decreased safety awareness;Cardiopulmonary status limiting activity;Decreased knowledge of precautions       PT Treatment Interventions DME instruction;Gait training;Therapeutic activities;Functional mobility training;Therapeutic exercise;Balance training;Neuromuscular re-education;Patient/family education;Cognitive remediation     PT Goals (Current goals can be found in the Care Plan section)  Acute Rehab PT Goals Patient Stated Goal: to not fall PT Goal Formulation: With patient Time For Goal Achievement: 11/04/22 Potential to Achieve Goals: Fair    Frequency Min 2X/week     Co-evaluation               AM-PAC PT "6 Clicks" Mobility  Outcome Measure Help needed turning from your back to your side while in a flat bed without using bedrails?: A Little Help needed moving from lying on your back to sitting on the side of a flat bed without using bedrails?: A Little Help needed moving to and from a bed to a chair (including a wheelchair)?: A Little Help needed standing up from a chair using your arms (e.g., wheelchair or bedside chair)?: A Little Help needed to walk in hospital room?: A Little Help needed climbing 3-5 steps with a railing? : A Lot 6 Click Score: 17    End of Session Equipment Utilized During Treatment: Gait belt Activity Tolerance: Patient tolerated treatment well Patient left: in bed;with call bell/phone within reach;with bed alarm set Nurse Communication: Mobility status PT Visit Diagnosis: Other abnormalities of gait and mobility (R26.89);History of falling (Z91.81)    Time: [id number] PT Time Calculation (min) (ACUTE ONLY): 16 min   Charges:   PT  Evaluation $PT Eval Low Complexity: 1 Low          Haja Crego, PT Acute Rehab [facility] [phone]   Karlton Lemon 10/21/2022, 10:13 AM

## 2022-10-22 ENCOUNTER — Other Ambulatory Visit: Payer: Self-pay

## 2022-10-22 DIAGNOSIS — J90 Pleural effusion, not elsewhere classified: Secondary | ICD-10-CM | POA: Diagnosis not present

## 2022-10-22 DIAGNOSIS — W19XXXA Unspecified fall, initial encounter: Secondary | ICD-10-CM | POA: Diagnosis not present

## 2022-10-22 DIAGNOSIS — D649 Anemia, unspecified: Secondary | ICD-10-CM | POA: Diagnosis not present

## 2022-10-22 MED ORDER — SENNOSIDES-DOCUSATE SODIUM 8.6-50 MG PO TABS: 1.0000 | ORAL_TABLET | Freq: Every evening | ORAL | Status: DC | PRN

## 2022-10-22 MED ORDER — METOPROLOL TARTRATE 50 MG PO TABS
50.0000 mg | ORAL_TABLET | Freq: Two times a day (BID) | ORAL | Status: DC
  Administered 2022-10-22 – 2022-10-23 (×3): 50 mg via ORAL
  Filled 2022-10-22 (×5): qty 1

## 2022-10-22 MED ORDER — IRBESARTAN 150 MG PO TABS
150.0000 mg | ORAL_TABLET | Freq: Every day | ORAL | Status: DC
  Administered 2022-10-22: 150 mg via ORAL
  Filled 2022-10-22 (×2): qty 1

## 2022-10-22 MED ORDER — QUETIAPINE FUMARATE 25 MG PO TABS
50.0000 mg | ORAL_TABLET | Freq: Every day | ORAL | Status: DC
  Administered 2022-10-22 – 2022-10-23 (×2): 50 mg via ORAL
  Filled 2022-10-22 (×3): qty 2

## 2022-10-22 MED ORDER — FLUOXETINE HCL 10 MG PO CAPS
10.0000 mg | ORAL_CAPSULE | Freq: Every day | ORAL | Status: DC
  Administered 2022-10-22: 10 mg via ORAL
  Filled 2022-10-22 (×3): qty 1

## 2022-10-22 NOTE — Progress Notes (Addendum)
[facility]                                                                              Tiffany Black, is a 89 y.o. female, DOB - [birth year], [id number] Admit date - 10/20/2022    Outpatient Primary MD for the patient is Ginger Organ., MD  LOS - 0  days  No chief complaint on file.      Brief summary   Patient is a 89 year old with dementia, persistent A-fib on Xarelto, generalized anxiety disorder, essential hypertension who was recently admitted 1/17-1/23/2024 with suspected pneumonia and right-sided pleural effusion status post thoracentesis, hemothorax and was DC'd to SNF on 1/23.  She presented to ED with a fall that was unwitnessed and was found on the floor.  Significant memory issues with impulsiveness. CT head negative for acute intracranial abnormality, C-spine negative for fracture or dislocation Multiple imagings negative for displaced fracture or dislocation Chest x-ray showed moderate to large right pleural effusion, appears increased from the recent prior study, underlying pneumonic infiltrate could be hidden.  Cardiomegaly.    Assessment & Plan    Principal Problem: Recurrent right-sided pleural effusion. -Recent admission 1/17-1/23, had undergone thoracentesis, 900 cc dark red exudative culture negative fluid removed.  It was felt that her pleural effusion was likely from hemothorax after she sustained a fall with rib fractures although difficult to rule out malignancy.  Pulmonology had recommended repeating chest x-ray, holding Xarelto in setting of outpatient thoracentesis with repeat fluid study.  She was discharged to SNF -Chest x-ray showed moderate to large right-sided pleural effusion, appears increased from the recent prior study -However asymptomatic, O2 sats 91 to 96% on room air, RR 17-19 -IR consulted for repeat thoracentesis, fluid analysis and cytology.  Patient is currently declining to have repeat thoracentesis -She is  scheduled to see pulmonary in follow-up on 2/12.   Active problems Anemia, normocytic -Hemoglobin 10.7 on admission, was 12.7 on 10/03/2022 -Continue to monitor.  Persistent atrial fibrillation -On chronic anticoagulation with Xarelto, currently held for thoracentesis. -HR controlled, continue metoprolol, increased back to outpatient dose of 50 mg twice daily   Essential hypertension -BP currently elevated, resume Avapro, continue Plendil, increase Lopressor to 50 mg twice daily   Generalized anxiety disorder, dementia  -Resume Prozac, sodium has improved -Continue Seroquel 50 mg at bedtime  Hyponatremia -On admission sodium 133, has improved to 135.  Tolerating diet.   -Prozac resumed -However if Na drops, will discontinue Prozac.    Estimated body mass index is 23.78 kg/m as calculated from the following:   Height as of this encounter: 5' 5"$  (1.651 m).   Weight as of this encounter: 64.8 kg.  Code Status: Full code DVT Prophylaxis:  SCDs Start: 10/20/22 2349   Level of Care: Level of care: Progressive Family Communication: updated patient's son on phone. He will come tomorrow AM to help facilitate thoracentesis. If patient declines again, plan to dc her and follow up with Dr Erin Fulling (pulm) at 1:30pm tomorrow, son agrees with plan.    Disposition Plan:      Remains inpatient appropriate: Awaiting thoracentesis  Procedures:  None  Consultants:   None  Antimicrobials: None   Medications  felodipine  5 mg Oral Daily   metoprolol tartrate  25 mg Oral BID      Subjective:   Tiffany Black was seen and examined today.  No acute complaints.  BP is somewhat elevated, no chest pain, acute shortness of breath, fevers.  Declines to do another thoracentesis today.    Objective:   Vitals:   10/21/22 2343 10/22/22 0301 10/22/22 0333 10/22/22 0800  BP: (!) 165/108  (!) 156/94 (!) 164/103  Pulse:  80 71 75  Resp: 20 20 18 17  $ Temp: (!) 97.5 F (36.4 C) (!) 97.3 F  (36.3 C) (!) 97.3 F (36.3 C) 98.5 F (36.9 C)  TempSrc: Oral Oral Oral Oral  SpO2: 93%  99% 96%  Weight:  64.8 kg    Height:        Intake/Output Summary (Last 24 hours) at 10/22/2022 1023 Last data filed at 10/22/2022 0550 Gross per 24 hour  Intake 215 ml  Output 200 ml  Net 15 ml     Wt Readings from Last 3 Encounters:  10/22/22 64.8 kg  10/03/22 60 kg  09/27/22 60 kg     Exam General: Alert and oriented x self, NAD Cardiovascular: S1 S2 auscultated,  RRR Respiratory: Diminished breath sounds on the right, otherwise no wheezing rales or rhonchi. Gastrointestinal: Soft, nontender, nondistended, + bowel sounds Ext: no pedal edema bilaterally Neuro: no new deficits Skin: No rashes Psych: dementia, oriented to self    Data Reviewed:  I have personally reviewed following labs    CBC Lab Results  Component Value Date   WBC 5.7 10/21/2022   RBC 3.40 (L) 10/21/2022   RBC 3.32 (L) 10/21/2022   HGB 10.8 (L) 10/21/2022   HCT 33.3 (L) 10/21/2022   MCV 97.9 10/21/2022   MCH 31.8 10/21/2022   PLT 241 10/21/2022   MCHC 32.4 10/21/2022   RDW 14.8 10/21/2022   LYMPHSABS 1.1 10/21/2022   MONOABS 0.6 10/21/2022   EOSABS 0.4 10/21/2022   BASOSABS 0.1 [id number]     Last metabolic panel Lab Results  Component Value Date   NA 135 10/21/2022   K 3.5 10/21/2022   CL 102 10/21/2022   CO2 24 10/21/2022   BUN 10 10/21/2022   CREATININE 1.05 (H) 10/21/2022   GLUCOSE 91 10/21/2022   GFRNONAA 52 (L) 10/21/2022   GFRAA 50 (L) 12/26/2019   CALCIUM 8.9 10/21/2022   PHOS 3.3 09/30/2022   PROT 5.7 (L) 10/21/2022   ALBUMIN 3.2 (L) 10/21/2022   BILITOT 1.2 10/21/2022   ALKPHOS 73 10/21/2022   AST 24 10/21/2022   ALT 23 10/21/2022   ANIONGAP 9 10/21/2022    CBG (last 3)  No results for input(s): "GLUCAP" in the last 72 hours.    Coagulation Profile: Recent Labs  Lab 10/20/22 2101 10/21/22 0632  INR 2.3* 1.4*     Radiology Studies: I have personally reviewed  the imaging studies  CT Cervical Spine Wo Contrast  Result Date: 10/20/2022 CLINICAL DATA:  unwitnessed fall EXAM: CT CERVICAL SPINE WITHOUT CONTRAST TECHNIQUE: Multidetector CT imaging of the cervical spine was performed without intravenous contrast. Multiplanar CT image reconstructions were also generated. RADIATION DOSE REDUCTION: This exam was performed according to the departmental dose-optimization program which includes automated exposure control, adjustment of the mA and/or kV according to patient size and/or use of iterative reconstruction technique. COMPARISON:  CT C-spine 10/03/2022 FINDINGS: Alignment: Normal. Skull base and vertebrae: Multilevel  mild-to-moderate degenerative changes spine. No severe associated neural foraminal or central canal stenosis. No acute fracture. No aggressive appearing focal osseous lesion or focal pathologic process. Soft tissues and spinal canal: No prevertebral fluid or swelling. No visible canal hematoma. Upper chest: At least moderate volume right pleural effusion. Other: Atherosclerotic plaque of the aortic arch. Aortic arch measures up to at least 3.4 cm. The right brachiocephalic artery measures up to 1.6 cm. IMPRESSION: 1. No acute displaced fracture or traumatic listhesis of the cervical spine. 2. At least moderate volume right pleural effusion. Please see separately dictated chest x-ray 10/20/2022. 3. Right brachiocephalic artery measures at the upper limits of normal (1.6 cm). 4. Aortic Atherosclerosis (ICD10-I70.0). Electronically Signed   By: Iven Finn M.D.   On: 10/20/2022 21:55   CT Head Wo Contrast  Result Date: 10/20/2022 CLINICAL DATA:  Unwitnessed fall EXAM: CT HEAD WITHOUT CONTRAST TECHNIQUE: Contiguous axial images were obtained from the base of the skull through the vertex without intravenous contrast. RADIATION DOSE REDUCTION: This exam was performed according to the departmental dose-optimization program which includes automated exposure  control, adjustment of the mA and/or kV according to patient size and/or use of iterative reconstruction technique. COMPARISON:  Head CT 10/03/2022 FINDINGS: Brain: No evidence of acute infarction, hemorrhage, hydrocephalus, extra-axial collection or mass lesion/mass effect. There is mild diffuse atrophy. There is moderate periventricular and deep white matter hypodensity, likely chronic small vessel ischemic change. Vascular: Atherosclerotic calcifications are present within the cavernous internal carotid arteries. Skull: Normal. Negative for fracture or focal lesion. Sinuses/Orbits: Chronic appearing opacities are again noted in the left maxillary sinus. No air-fluid levels. Orbits are within normal limits. Other: None. IMPRESSION: 1. No acute intracranial process. 2. Atrophy and chronic small vessel ischemic changes. Electronically Signed   By: Ronney Asters M.D.   On: 10/20/2022 21:54   DG Chest Portable 1 View  Result Date: 10/20/2022 CLINICAL DATA:  Polytrauma injury from unwitnessed fall. EXAM: RIGHT FOOT COMPLETE - 3+ VIEW; PORTABLE PELVIS 1-2 VIEWS; PORTABLE CHEST - 1 VIEW; RIGHT TIBIA AND FIBULA - 2 VIEW COMPARISON:  Portable chest films from 10/03/2022 and 10/01/2022, portable AP pelvis 10/03/2022. No prior right foot or right tib fib series. FINDINGS: CHEST AP PORTABLE AT 9:17 p.m.: Moderate-to-large right pleural effusion is again noted and appears to have increased, now extending to the level of the mid hilum. There is obscuration of the lower half of the right chest. Underlying pneumonic infiltrate could be hidden. Remaining lungs are clear with COPD. The heart is enlarged but unchanged. Central vascular markings are normal caliber. There is aortic tortuosity and calcification with stable mediastinum. Osteopenia and degenerative change thoracic spine.  No pneumothorax. PORTABLE AP PELVIS, SINGLE VIEW: There is osteopenia without evidence of displaced fractures of the proximal femurs, pelvis and  sacrum. There is spurring at the SI joints, pubic symphysis. Again noted are mild-to-moderate right and mild left nonerosive bilateral hip DJD. There are enthesopathic changes of the pelvis. Advanced degenerative change in the visualized lower lumbar spine is noted. There are pelvic phleboliths. Comparison to the recent prior study reveals no significant interval change. AP LAT RIGHT TIBIA FIBULA SERIES IN 4 FILMS: There is mild generalized soft tissue stranding edema throughout the right foreleg. This continues over the ankle and foot where the edema is moderate. Underlying bones show osteopenia without displaced fractures or primary bone lesion. RIGHT FOOT, ROUTINE THREE VIEWS: Diffuse moderate edema. There is osteopenia without evidence of fractures. There is mild hallux valgus with  otherwise normal interosseous alignment. There is mild degenerative arthrosis in the midfoot and forefoot but no erosive arthropathy. Dorsal and plantar calcaneal enthesopathy is noted and appears noninflammatory. IMPRESSION: 1. Moderate-to-large right pleural effusion, appears increased from the recent prior study. Underlying pneumonic infiltrate could be hidden. 2. Cardiomegaly and aortic atherosclerosis. 3. Osteopenia and degenerative change without evidence of fractures of the AP pelvis, proximal femurs, right tibia fibula and right foot. 4. Soft tissue edema throughout the right foreleg and foot. 5. Pelvic and calcaneal enthesopathic change. 6. Hallux valgus. Mild degenerative changes of the midfoot and forefoot. Electronically Signed   By: Telford Nab M.D.   On: 10/20/2022 21:54   DG Pelvis Portable  Result Date: 10/20/2022 CLINICAL DATA:  Polytrauma injury from unwitnessed fall. EXAM: RIGHT FOOT COMPLETE - 3+ VIEW; PORTABLE PELVIS 1-2 VIEWS; PORTABLE CHEST - 1 VIEW; RIGHT TIBIA AND FIBULA - 2 VIEW COMPARISON:  Portable chest films from 10/03/2022 and 10/01/2022, portable AP pelvis 10/03/2022. No prior right foot or right  tib fib series. FINDINGS: CHEST AP PORTABLE AT 9:17 p.m.: Moderate-to-large right pleural effusion is again noted and appears to have increased, now extending to the level of the mid hilum. There is obscuration of the lower half of the right chest. Underlying pneumonic infiltrate could be hidden. Remaining lungs are clear with COPD. The heart is enlarged but unchanged. Central vascular markings are normal caliber. There is aortic tortuosity and calcification with stable mediastinum. Osteopenia and degenerative change thoracic spine.  No pneumothorax. PORTABLE AP PELVIS, SINGLE VIEW: There is osteopenia without evidence of displaced fractures of the proximal femurs, pelvis and sacrum. There is spurring at the SI joints, pubic symphysis. Again noted are mild-to-moderate right and mild left nonerosive bilateral hip DJD. There are enthesopathic changes of the pelvis. Advanced degenerative change in the visualized lower lumbar spine is noted. There are pelvic phleboliths. Comparison to the recent prior study reveals no significant interval change. AP LAT RIGHT TIBIA FIBULA SERIES IN 4 FILMS: There is mild generalized soft tissue stranding edema throughout the right foreleg. This continues over the ankle and foot where the edema is moderate. Underlying bones show osteopenia without displaced fractures or primary bone lesion. RIGHT FOOT, ROUTINE THREE VIEWS: Diffuse moderate edema. There is osteopenia without evidence of fractures. There is mild hallux valgus with otherwise normal interosseous alignment. There is mild degenerative arthrosis in the midfoot and forefoot but no erosive arthropathy. Dorsal and plantar calcaneal enthesopathy is noted and appears noninflammatory. IMPRESSION: 1. Moderate-to-large right pleural effusion, appears increased from the recent prior study. Underlying pneumonic infiltrate could be hidden. 2. Cardiomegaly and aortic atherosclerosis. 3. Osteopenia and degenerative change without evidence  of fractures of the AP pelvis, proximal femurs, right tibia fibula and right foot. 4. Soft tissue edema throughout the right foreleg and foot. 5. Pelvic and calcaneal enthesopathic change. 6. Hallux valgus. Mild degenerative changes of the midfoot and forefoot. Electronically Signed   By: Telford Nab M.D.   On: 10/20/2022 21:54   DG Foot Complete Right  Result Date: 10/20/2022 CLINICAL DATA:  Polytrauma injury from unwitnessed fall. EXAM: RIGHT FOOT COMPLETE - 3+ VIEW; PORTABLE PELVIS 1-2 VIEWS; PORTABLE CHEST - 1 VIEW; RIGHT TIBIA AND FIBULA - 2 VIEW COMPARISON:  Portable chest films from 10/03/2022 and 10/01/2022, portable AP pelvis 10/03/2022. No prior right foot or right tib fib series. FINDINGS: CHEST AP PORTABLE AT 9:17 p.m.: Moderate-to-large right pleural effusion is again noted and appears to have increased, now extending to the level of  the mid hilum. There is obscuration of the lower half of the right chest. Underlying pneumonic infiltrate could be hidden. Remaining lungs are clear with COPD. The heart is enlarged but unchanged. Central vascular markings are normal caliber. There is aortic tortuosity and calcification with stable mediastinum. Osteopenia and degenerative change thoracic spine.  No pneumothorax. PORTABLE AP PELVIS, SINGLE VIEW: There is osteopenia without evidence of displaced fractures of the proximal femurs, pelvis and sacrum. There is spurring at the SI joints, pubic symphysis. Again noted are mild-to-moderate right and mild left nonerosive bilateral hip DJD. There are enthesopathic changes of the pelvis. Advanced degenerative change in the visualized lower lumbar spine is noted. There are pelvic phleboliths. Comparison to the recent prior study reveals no significant interval change. AP LAT RIGHT TIBIA FIBULA SERIES IN 4 FILMS: There is mild generalized soft tissue stranding edema throughout the right foreleg. This continues over the ankle and foot where the edema is moderate.  Underlying bones show osteopenia without displaced fractures or primary bone lesion. RIGHT FOOT, ROUTINE THREE VIEWS: Diffuse moderate edema. There is osteopenia without evidence of fractures. There is mild hallux valgus with otherwise normal interosseous alignment. There is mild degenerative arthrosis in the midfoot and forefoot but no erosive arthropathy. Dorsal and plantar calcaneal enthesopathy is noted and appears noninflammatory. IMPRESSION: 1. Moderate-to-large right pleural effusion, appears increased from the recent prior study. Underlying pneumonic infiltrate could be hidden. 2. Cardiomegaly and aortic atherosclerosis. 3. Osteopenia and degenerative change without evidence of fractures of the AP pelvis, proximal femurs, right tibia fibula and right foot. 4. Soft tissue edema throughout the right foreleg and foot. 5. Pelvic and calcaneal enthesopathic change. 6. Hallux valgus. Mild degenerative changes of the midfoot and forefoot. Electronically Signed   By: Telford Nab M.D.   On: 10/20/2022 21:54   DG Tibia/Fibula Right  Result Date: 10/20/2022 CLINICAL DATA:  Polytrauma injury from unwitnessed fall. EXAM: RIGHT FOOT COMPLETE - 3+ VIEW; PORTABLE PELVIS 1-2 VIEWS; PORTABLE CHEST - 1 VIEW; RIGHT TIBIA AND FIBULA - 2 VIEW COMPARISON:  Portable chest films from 10/03/2022 and 10/01/2022, portable AP pelvis 10/03/2022. No prior right foot or right tib fib series. FINDINGS: CHEST AP PORTABLE AT 9:17 p.m.: Moderate-to-large right pleural effusion is again noted and appears to have increased, now extending to the level of the mid hilum. There is obscuration of the lower half of the right chest. Underlying pneumonic infiltrate could be hidden. Remaining lungs are clear with COPD. The heart is enlarged but unchanged. Central vascular markings are normal caliber. There is aortic tortuosity and calcification with stable mediastinum. Osteopenia and degenerative change thoracic spine.  No pneumothorax. PORTABLE AP  PELVIS, SINGLE VIEW: There is osteopenia without evidence of displaced fractures of the proximal femurs, pelvis and sacrum. There is spurring at the SI joints, pubic symphysis. Again noted are mild-to-moderate right and mild left nonerosive bilateral hip DJD. There are enthesopathic changes of the pelvis. Advanced degenerative change in the visualized lower lumbar spine is noted. There are pelvic phleboliths. Comparison to the recent prior study reveals no significant interval change. AP LAT RIGHT TIBIA FIBULA SERIES IN 4 FILMS: There is mild generalized soft tissue stranding edema throughout the right foreleg. This continues over the ankle and foot where the edema is moderate. Underlying bones show osteopenia without displaced fractures or primary bone lesion. RIGHT FOOT, ROUTINE THREE VIEWS: Diffuse moderate edema. There is osteopenia without evidence of fractures. There is mild hallux valgus with otherwise normal interosseous alignment. There is mild  degenerative arthrosis in the midfoot and forefoot but no erosive arthropathy. Dorsal and plantar calcaneal enthesopathy is noted and appears noninflammatory. IMPRESSION: 1. Moderate-to-large right pleural effusion, appears increased from the recent prior study. Underlying pneumonic infiltrate could be hidden. 2. Cardiomegaly and aortic atherosclerosis. 3. Osteopenia and degenerative change without evidence of fractures of the AP pelvis, proximal femurs, right tibia fibula and right foot. 4. Soft tissue edema throughout the right foreleg and foot. 5. Pelvic and calcaneal enthesopathic change. 6. Hallux valgus. Mild degenerative changes of the midfoot and forefoot. Electronically Signed   By: Telford Nab M.D.   On: 10/20/2022 21:54       Yidel Teuscher M.D. [facility] 10/22/2022, 10:23 AM  Available via Epic secure chat 7am-7pm After 7 pm, please refer to night coverage provider listed on amion.

## 2022-10-22 NOTE — TOC CAGE-AID Note (Signed)
Transition of Care [facility]) - CAGE-AID Screening  Patient Details  Name: Tiffany Black MRN: [id number] Date of Birth: 01/27/1934  Clinical Narrative:  Patient to ED after a fall. Patient only oriented to self and place, disoriented to time and situation. Patient has baseline dementia and is from a memory care SNF, previous charting confirms previous reports of no alcohol or drug use. No need to supply patient with substance abuse resources at this time.  CAGE-AID Screening:   Have You Ever Felt You Ought to Cut Down on Your Drinking or Drug Use?: No Have People Annoyed You By Critizing Your Drinking Or Drug Use?: No Have You Felt Bad Or Guilty About Your Drinking Or Drug Use?: No Have You Ever Had a Drink or Used Drugs First Thing In The Morning to Steady Your Nerves or to Get Rid of a Hangover?: No CAGE-AID Score: 0  Substance Abuse Education Offered: No

## 2022-10-22 NOTE — Progress Notes (Signed)
     Consult received. Chart reviewed. Called son/Christopher in efforts to discuss goals of care. There was no answer so I left a voicemail requesting a call back.  I will try calling son again tomorrow.     Elie Confer, NP-C Palliative Medicine   Please call Palliative Medicine team phone with any questions [phone]. For individual providers please see AMION.   No charge

## 2022-10-22 NOTE — Consult Note (Incomplete)
Palliative Care Consult Note                                  Date: 10/22/2022   Patient Name: Tiffany Black  DOB: 29-Feb-1934  MRN: [id number]  Age / Sex: 89 y.o., female  PCP: Ginger Organ., MD Referring Physician: Mendel Corning, MD  Reason for Consultation: Establishing goals of care  HPI/Patient Profile: 89 y.o. female  with past medical history of dementia, persistent A-fib on Xarelto, generalized anxiety disorder, and hypertension.  She was recently admitted 09/27/2022 - 10/03/2022 with suspected pneumonia and right-sided pleural effusion status postthoracentesis.  She presented to the ED on 10/20/2022 with an unwitnessed fall and was found on the floor.  CT head negative for acute intracranial abnormality.  Chest x-ray showed moderate to large right pleural effusion that appears increased from the recent prior study.  Past Medical History:  Diagnosis Date   Anxiety    Arthritis    Bulging of cervical intervertebral disc    Cardiomegaly    Cataracts, bilateral    Chronic neck pain    Dementia (HCC)    Essential hypertension    History of endometrial cancer    History of stroke    2015 and 2016 // carotid US 8/16: Bilateral 1-39 // carotid US 2/16: Bilateral ICA 1-39   HLD (hyperlipidemia)    Hx of echocardiogram    a. Echo (11/15): Mild LVH, EF 55-60%, normal wall motion, grade 2 diastolic dysfunction, mild LAE, normal RV function, PASP 29 mm Hg // echo 8/16:Mild concentric LVH, EF 55-60, normal wall motion, grade 2 diastolic dysfunction, mild LAE, PASP 39   Hypertension    Insomnia    Lung nodule    OA (osteoarthritis)    Pectus excavatum    Persistent atrial fibrillation (HCC)    Vertigo     Subjective:   I have reviewed medical records including progress notes, labs and imaging, and assessed the patient at bedside. She has just walked back to bed after getting up to use the bathroom without calling for help. She is  pleasantly confused. Does not know why she is in the hospital.   I met with *** to discuss diagnosis, prognosis, GOC, EOL wishes, disposition, and options.  Patient is known to PMT from her recent hospitalization.  I re-introduced Palliative Medicine as specialized medical care for people living with serious illness. It focuses on providing relief from the symptoms and stress of a serious illness.   A brief life review was discussed. Lashonta is from [facility]. She is widowed.   We discussed patient's current illness and what it means in the larger context of his/her ongoing co-morbidities. Current clinical status was reviewed. Natural disease trajectory of *** was discussed.  Created space and opportunity for patient and family to explore thoughts and feelings regarding current medical situation.  Values and goals of care important to patient and family were attempted to be elicited.  A discussion was had today regarding advanced directives. Concepts specific to code status, artifical feeding and hydration, continued IV antibiotics and rehospitalization was had.  The MOST form was introduced and discussed.  Questions and concerns addressed. Patient/family encouraged to call with questions or concerns.     Life Review: ***  Functional Status: ***  Patient/Family Understanding of Illness: ***  Patient Values: ***  Goals: ***  Additional Discussion: ***  Review of Systems  Unable to perform ROS   Objective:   Primary Diagnoses: Present on Admission:  Acute anemia  Recurrent right pleural effusion  Hyponatremia  Persistent atrial fibrillation (HCC)  GAD (generalized anxiety disorder)  Essential hypertension   Physical Exam Vitals reviewed.  Constitutional:      General: She is not in acute distress.    Comments: Frail, chronically ill-appearing  Neurological:     Mental Status: She is alert.     Vital Signs:  BP (!) 154/112 (BP Location: Right Arm)    Pulse 72   Temp 97.9 F (36.6 C) (Oral)   Resp 18   Ht 5' 5"$  (1.651 m)   Wt 64.8 kg   SpO2 93%   BMI 23.78 kg/m   Palliative Assessment/Data: ***     Assessment & Plan:   SUMMARY OF RECOMMENDATIONS   ***  Primary Decision Maker: {Primary Decision EZ:[id number]  Code Status/Advance Care Planning: {Palliative Code status:23503}  Symptom Management:  ***  Prognosis:  {Palliative Care Prognosis:23504}  Discharge Planning:  {Palliative dispostion:23505}   Discussed with: ***    Thank you for allowing [place] to participate in the care of [name]   Time Total: ***  Greater than 50%  of this time was spent counseling and coordinating care related to the above assessment and plan.  Signed by: Elie Confer, NP Palliative Medicine Team  Team Phone # [phone]  For individual providers, please see AMION

## 2022-10-22 NOTE — Progress Notes (Signed)
Patient's son, Gerald Stabs, is asking for a call back from physician team. He is confused as to why his mom declined the thoracentesis today. Would like to know the plan going forward bc Ms. Tiffany Black has an appt with pulmonology tomorrow (10/23/22). [phone] or [phone]. *message sent to attending physician.

## 2022-10-23 ENCOUNTER — Inpatient Hospital Stay (HOSPITAL_COMMUNITY): Payer: Medicare Other

## 2022-10-23 ENCOUNTER — Institutional Professional Consult (permissible substitution): Payer: Medicare Other | Admitting: Pulmonary Disease

## 2022-10-23 DIAGNOSIS — F411 Generalized anxiety disorder: Secondary | ICD-10-CM | POA: Diagnosis present

## 2022-10-23 DIAGNOSIS — Z882 Allergy status to sulfonamides status: Secondary | ICD-10-CM | POA: Diagnosis not present

## 2022-10-23 DIAGNOSIS — E871 Hypo-osmolality and hyponatremia: Secondary | ICD-10-CM | POA: Diagnosis present

## 2022-10-23 DIAGNOSIS — Z885 Allergy status to narcotic agent status: Secondary | ICD-10-CM | POA: Diagnosis not present

## 2022-10-23 DIAGNOSIS — J90 Pleural effusion, not elsewhere classified: Secondary | ICD-10-CM | POA: Diagnosis present

## 2022-10-23 DIAGNOSIS — Z880 Allergy status to penicillin: Secondary | ICD-10-CM | POA: Diagnosis not present

## 2022-10-23 DIAGNOSIS — Z91048 Other nonmedicinal substance allergy status: Secondary | ICD-10-CM | POA: Diagnosis not present

## 2022-10-23 DIAGNOSIS — Z1152 Encounter for screening for COVID-19: Secondary | ICD-10-CM | POA: Diagnosis not present

## 2022-10-23 DIAGNOSIS — R911 Solitary pulmonary nodule: Secondary | ICD-10-CM | POA: Diagnosis present

## 2022-10-23 DIAGNOSIS — Z888 Allergy status to other drugs, medicaments and biological substances status: Secondary | ICD-10-CM | POA: Diagnosis not present

## 2022-10-23 DIAGNOSIS — M1990 Unspecified osteoarthritis, unspecified site: Secondary | ICD-10-CM | POA: Diagnosis present

## 2022-10-23 DIAGNOSIS — F0394 Unspecified dementia, unspecified severity, with anxiety: Secondary | ICD-10-CM | POA: Diagnosis present

## 2022-10-23 DIAGNOSIS — Z7901 Long term (current) use of anticoagulants: Secondary | ICD-10-CM | POA: Diagnosis not present

## 2022-10-23 DIAGNOSIS — I10 Essential (primary) hypertension: Secondary | ICD-10-CM | POA: Diagnosis present

## 2022-10-23 DIAGNOSIS — Z79899 Other long term (current) drug therapy: Secondary | ICD-10-CM | POA: Diagnosis not present

## 2022-10-23 DIAGNOSIS — W19XXXA Unspecified fall, initial encounter: Secondary | ICD-10-CM | POA: Diagnosis present

## 2022-10-23 DIAGNOSIS — I4819 Other persistent atrial fibrillation: Secondary | ICD-10-CM | POA: Diagnosis present

## 2022-10-23 DIAGNOSIS — Z8673 Personal history of transient ischemic attack (TIA), and cerebral infarction without residual deficits: Secondary | ICD-10-CM | POA: Diagnosis not present

## 2022-10-23 DIAGNOSIS — E785 Hyperlipidemia, unspecified: Secondary | ICD-10-CM | POA: Diagnosis present

## 2022-10-23 DIAGNOSIS — Z8249 Family history of ischemic heart disease and other diseases of the circulatory system: Secondary | ICD-10-CM | POA: Diagnosis not present

## 2022-10-23 DIAGNOSIS — G4700 Insomnia, unspecified: Secondary | ICD-10-CM | POA: Diagnosis present

## 2022-10-23 DIAGNOSIS — Z91040 Latex allergy status: Secondary | ICD-10-CM | POA: Diagnosis not present

## 2022-10-23 DIAGNOSIS — Z91041 Radiographic dye allergy status: Secondary | ICD-10-CM | POA: Diagnosis not present

## 2022-10-23 DIAGNOSIS — D638 Anemia in other chronic diseases classified elsewhere: Secondary | ICD-10-CM | POA: Diagnosis present

## 2022-10-23 DIAGNOSIS — Z8542 Personal history of malignant neoplasm of other parts of uterus: Secondary | ICD-10-CM | POA: Diagnosis not present

## 2022-10-23 DIAGNOSIS — D649 Anemia, unspecified: Secondary | ICD-10-CM | POA: Diagnosis not present

## 2022-10-23 HISTORY — PX: IR THORACENTESIS RIGHT ASP PLEURAL SPACE W/IMG GUIDE: IMG5380

## 2022-10-23 LAB — BODY FLUID CELL COUNT WITH DIFFERENTIAL
Eos, Fluid: 0 %
Lymphs, Fluid: 98 %
Monocyte-Macrophage-Serous Fluid: 2 % — ABNORMAL LOW (ref 50–90)
Neutrophil Count, Fluid: 0 % (ref 0–25)
Total Nucleated Cell Count, Fluid: 726 cu mm (ref 0–1000)

## 2022-10-23 LAB — GLUCOSE, PLEURAL OR PERITONEAL FLUID: Glucose, Fluid: 101 mg/dL

## 2022-10-23 MED ORDER — IRBESARTAN 300 MG PO TABS
300.0000 mg | ORAL_TABLET | Freq: Every day | ORAL | Status: DC
  Administered 2022-10-24: 300 mg via ORAL
  Filled 2022-10-23: qty 1

## 2022-10-23 MED ORDER — LIDOCAINE HCL 1 % IJ SOLN
INTRAMUSCULAR | Status: AC
  Administered 2022-10-23: 10 mL
  Filled 2022-10-23: qty 20

## 2022-10-23 NOTE — Progress Notes (Signed)
[facility]                                                                              Tiffany Black, is a 89 y.o. female, DOB - [birth year], [id number] Admit date - 10/20/2022    Outpatient Primary MD for the patient is Ginger Organ., MD  LOS - 0  days  No chief complaint on file.      Brief summary   Patient is a 89 year old with dementia, persistent A-fib on Xarelto, generalized anxiety disorder, essential hypertension who was recently admitted 1/17-1/23/2024 with suspected pneumonia and right-sided pleural effusion status post thoracentesis, hemothorax and was DC'd to SNF on 1/23.  She presented to ED with a fall that was unwitnessed and was found on the floor.  Significant memory issues with impulsiveness. CT head negative for acute intracranial abnormality, C-spine negative for fracture or dislocation Multiple imagings negative for displaced fracture or dislocation Chest x-ray showed moderate to large right pleural effusion, appears increased from the recent prior study, underlying pneumonic infiltrate could be hidden.  Cardiomegaly.    Assessment & Plan    Principal Problem: Recurrent right-sided pleural effusion. -Recent admission 1/17-1/23, had undergone thoracentesis, 900 cc dark red exudative culture negative fluid removed.  It was felt that her pleural effusion was likely from hemothorax after she sustained a fall with rib fractures although difficult to rule out malignancy.  Pulmonology had recommended repeating chest x-ray, holding Xarelto in setting of outpatient thoracentesis with repeat fluid study.  She was discharged to SNF -CXR :moderate to large right-sided pleural effusion, appears increased from the recent prior study -IR consulted for repeat thoracentesis, fluid analysis and cytology.  Patient agreeing for thoracentesis today, son at the bedside.  Will follow labs. -Appointment with pulmonology, Dr. Erin Fulling rescheduled to 2/21 at [facility]    Active problems Anemia, normocytic -Hemoglobin 10.7 on admission, was 12.7 on 10/03/2022 Anemia panel consistent with anemia of chronic disease -H&H remained stable  Persistent atrial fibrillation -On chronic anticoagulation with Xarelto, currently held for thoracentesis. -HR controlled, continue metoprolol, 50 mg twice daily    Essential hypertension -BP elevated, Avapro increased to 300 mg daily  -Continue Plendil, Lopressor    Generalized anxiety disorder, dementia  -Resumed Prozac, Na improved  -Continue Seroquel 50 mg at bedtime  Hyponatremia -On admission sodium 133, has improved to 135.  Tolerating diet.   -Prozac resumed    Estimated body mass index is 23.85 kg/m as calculated from the following:   Height as of this encounter: 5' 5"$  (1.651 m).   Weight as of this encounter: 65 kg.  Code Status: Full code DVT Prophylaxis:  SCDs Start: 10/20/22 2349   Level of Care: Level of care: Progressive Family Communication: Updated patient's son at the bedside today Disposition Plan:      Remains inpatient appropriate: Awaiting thoracentesis  Procedures:  None  Consultants:   None  Antimicrobials: None   Medications  felodipine  5 mg Oral Daily   FLUoxetine  10 mg Oral Daily   irbesartan  150 mg Oral Daily   metoprolol tartrate  50 mg Oral BID   QUEtiapine  50 mg Oral QHS      Subjective:   [name] was seen and examined today.  Currently no acute complaints, BP somewhat elevated.  Son at the bedside.  Patient is now agreeing for the thoracentesis procedure.  No acute chest pain or shortness of breath, nausea or vomiting.   Objective:   Vitals:   10/23/22 0350 10/23/22 0412 10/23/22 0736 10/23/22 0851  BP: (!) 168/97  (!) 174/100 (!) 146/85  Pulse: 82  (!) 50 64  Resp: 19  20 20  $ Temp: (!) 97.3 F (36.3 C)  97.9 F (36.6 C) (!) 97.4 F (36.3 C)  TempSrc: Oral  Oral Oral  SpO2: 91%  (!) 85% 100%  Weight:  65 kg    Height:         Intake/Output Summary (Last 24 hours) at 10/23/2022 1123 Last data filed at 10/22/2022 1820 Gross per 24 hour  Intake 480 ml  Output --  Net 480 ml     Wt Readings from Last 3 Encounters:  10/23/22 65 kg  10/03/22 60 kg  09/27/22 60 kg    Physical Exam General: Alert and oriented, NAD Cardiovascular: S1 S2 clear, RRR.  Respiratory: dec BS R> L, no wheezing.   Gastrointestinal: Soft, nontender, nondistended, NBS Ext: no pedal edema bilaterally Neuro: no new deficits Psych: dementia, oriented to self and person    Data Reviewed:  I have personally reviewed following labs    CBC Lab Results  Component Value Date   WBC 5.7 10/21/2022   RBC 3.40 (L) 10/21/2022   RBC 3.32 (L) 10/21/2022   HGB 10.8 (L) 10/21/2022   HCT 33.3 (L) 10/21/2022   MCV 97.9 10/21/2022   MCH 31.8 10/21/2022   PLT 241 10/21/2022   MCHC 32.4 10/21/2022   RDW 14.8 10/21/2022   LYMPHSABS 1.1 10/21/2022   MONOABS 0.6 10/21/2022   EOSABS 0.4 10/21/2022   BASOSABS 0.1 [id number]     Last metabolic panel Lab Results  Component Value Date   NA 135 10/21/2022   K 3.5 10/21/2022   CL 102 10/21/2022   CO2 24 10/21/2022   BUN 10 10/21/2022   CREATININE 1.05 (H) 10/21/2022   GLUCOSE 91 10/21/2022   GFRNONAA 52 (L) 10/21/2022   GFRAA 50 (L) 12/26/2019   CALCIUM 8.9 10/21/2022   PHOS 3.3 09/30/2022   PROT 5.7 (L) 10/21/2022   ALBUMIN 3.2 (L) 10/21/2022   BILITOT 1.2 10/21/2022   ALKPHOS 73 10/21/2022   AST 24 10/21/2022   ALT 23 10/21/2022   ANIONGAP 9 10/21/2022    CBG (last 3)  No results for input(s): "GLUCAP" in the last 72 hours.    Coagulation Profile: Recent Labs  Lab 10/20/22 2101 10/21/22 0632  INR 2.3* 1.4*     Radiology Studies: I have personally reviewed the imaging studies  No results found.     Estill Cotta M.D. [facility] 10/23/2022, 11:23 AM  Available via Epic secure chat 7am-7pm After 7 pm, please refer to night coverage provider listed on  amion.

## 2022-10-23 NOTE — TOC Initial Note (Addendum)
Transition of Care [facility]) - Initial/Assessment Note    Patient Details  Name: Tiffany Black MRN: [id number] Date of Birth: [birth year]  [facility]) CM/SW Contact:    Bethann Berkshire, [place] Phone Number: 10/23/2022, 1:58 PM  Clinical Narrative:                  Pt not oriented. CSW called pt's son to discuss SNF. Per chart review pt was admitted from [facility]. No answer; CSW left voicemail requesting call back.   1505: CSW received call back from pt's son. Son confirmed plan for pt to return to [facility] for STR when medically ready. CSW explained SNF auth would need to be resubmitted. TOC will continue to follow to assist with eventual DC to [facility].   Expected Discharge Plan: Skilled Nursing Facility Barriers to Discharge: Continued Medical Work up, Ship broker   Patient Goals and CMS Choice            Expected Discharge Plan and Services                                              Prior Living Arrangements/Services                       Activities of Daily Living Home Assistive Devices/Equipment: Environmental consultant (specify type) ADL Screening (condition at time of admission) Patient's cognitive ability adequate to safely complete daily activities?: No Is the patient deaf or have difficulty hearing?: Yes Does the patient have difficulty seeing, even when wearing glasses/contacts?: No Does the patient have difficulty concentrating, remembering, or making decisions?: Yes Patient able to express need for assistance with ADLs?: Yes Does the patient have difficulty dressing or bathing?: Yes Independently performs ADLs?: No Communication: Independent Dressing (OT): Independent Grooming: Needs assistance Is this a change from baseline?: Pre-admission baseline Feeding: Independent Bathing: Needs assistance Is this a change from baseline?: Pre-admission baseline Toileting: Needs assistance Is this a change from baseline?:  Pre-admission baseline In/Out Bed: Needs assistance Is this a change from baseline?: Pre-admission baseline Walks in Home: Needs assistance Is this a change from baseline?: Pre-admission baseline Does the patient have difficulty walking or climbing stairs?: Yes Weakness of Legs: Both Weakness of Arms/Hands: None  Permission Sought/Granted                  Emotional Assessment              Admission diagnosis:  Pleural effusion [J90] Fall, initial encounter [W19.XXXA] Anemia, unspecified type [D64.9] Acute anemia [D64.9] Patient Active Problem List   Diagnosis Date Noted   Acute anemia 10/20/2022   Pain and swelling of right wrist 10/02/2022   Shortness of breath 09/29/2022   Multiple fractures of ribs, right side, subsequent encounter for fracture with routine healing 09/29/2022   Goals of care, counseling/discussion 09/29/2022   Recurrent right pleural effusion 09/27/2022   Dementia with behavioral disturbance ([place]) 09/27/2022   Lung nodule 09/27/2022   COVID-19 virus infection 08/27/2022   Acute respiratory failure with hypoxia ([place]) 08/27/2022   Generalized weakness 08/27/2022   Fall 08/27/2022   Hyponatremia 08/27/2022   Stage 3a chronic kidney disease (CKD) ([place]) 08/27/2022   Anemia of chronic disease 08/27/2022   Acute encephalopathy 08/10/2022   GAD (generalized anxiety disorder) 08/10/2022   TIA (transient ischemic attack) 08/10/2022  Aphasia 08/09/2022   Atrial fibrillation with RVR ([place]) 09/07/2021   Headache syndrome 07/02/2017   Chest pain 05/23/2015   Essential hypertension    History of stroke 05/10/2015   Dizziness 05/10/2015   Orthostatic hypotension 05/10/2015   CVA (cerebral infarction) 05/10/2015   Hypokalemia 09/30/2013   Vertigo 02/28/2013   Sinusitis 02/28/2013   Anxiety 02/28/2013   Paroxysmal atrial fibrillation with RVR ([place]) 02/28/2013   PAC (premature atrial contraction) 04/10/2012   Abnormal EKG 01/19/2012   Persistent  atrial fibrillation ([place]) 02/15/2010   PCP:  Ginger Organ., MD Pharmacy:   [id number] [place] [place] [address] [id number]: [phone]: [address] [id number]: [phone]: [address] [place]Phone: [phone]: [phone]    Social Determinants of Health ([place] Social History: [place] No Food Insecurity (10/22/2022)  Housing: Low Risk  (10/22/2022)  Transportation Needs: No Transportation Needs (10/22/2022)  Utilities: Not At Risk (10/22/2022)  Tobacco Use: Low Risk  (10/20/2022)   SDOH Interventions:     Readmission Risk Interventions    08/29/2022   10:19 AM  Readmission Risk Prevention Plan  Transportation Screening Complete  Medication Review (RN Care Manager) Complete  PCP or Specialist appointment within 3-5 days of discharge Complete  HRI or Home Care Consult Complete  SW Recovery Care/Counseling Consult Complete  Palliative Care Screening Not [place]den[place]

## 2022-10-23 NOTE — NC FL2 (Signed)
[place] LEVEL OF CARE FORM     IDENTIFICATION  Patient Name: Tiffany Black Birthdate: Dec 26, 1933 Sex: female Admission Date (Current Location): 10/20/2022  [facility] and [place] Number:  Herbalist and Address:  [facility] [place], [place] [address], [place], [place] [address]      Provider Number: [id number]  Attending Physician Name and Address:  Mendel Corning, MD  Relative Name and Phone Number:       Current Level of Care: Hospital Recommended Level of Care: [place] Prior Approval Number:    Date Approved/Denied:   PASRR Number: [id number]  Discharge Plan: SNF    Current Diagnoses: Patient Active Problem List   Diagnosis Date Noted   Acute anemia 10/20/2022   Pain and swelling of right wrist 10/02/2022   Shortness of breath 09/29/2022   Multiple fractures of ribs, right side, subsequent encounter for fracture with routine healing 09/29/2022   Goals of care, counseling/discussion 09/29/2022   Recurrent right pleural effusion 09/27/2022   Dementia with behavioral disturbance ([place]) 09/27/2022   Lung nodule 09/27/2022   COVID-19 virus infection 08/27/2022   Acute respiratory failure with hypoxia ([place]) 08/27/2022   Generalized weakness 08/27/2022   Fall 08/27/2022   Hyponatremia 08/27/2022   Stage 3a chronic kidney disease (CKD) ([place]) 08/27/2022   Anemia of chronic disease 08/27/2022   Acute encephalopathy 08/10/2022   GAD (generalized anxiety disorder) 08/10/2022   TIA (transient ischemic attack) 08/10/2022   Aphasia 08/09/2022   Atrial fibrillation with RVR ([place]) 09/07/2021   Headache syndrome 07/02/2017   Chest pain 05/23/2015   Essential hypertension    History of stroke 05/10/2015   Dizziness 05/10/2015   Orthostatic hypotension 05/10/2015   CVA (cerebral infarction) 05/10/2015   Hypokalemia 09/30/2013   Vertigo 02/28/2013   Sinusitis 02/28/2013   Anxiety 02/28/2013   Paroxysmal atrial  fibrillation with RVR ([place]) 02/28/2013   PAC (premature atrial contraction) 04/10/2012   Abnormal EKG 01/19/2012   Persistent atrial fibrillation ([place]) 02/15/2010    Orientation RESPIRATION BLADDER Height & Weight     Self  O2 (2L [place]) Continent Weight: 143 lb 4.8 oz (65 kg) Height:  5' 5"$  (165.1 cm)  BEHAVIORAL SYMPTOMS/MOOD NEUROLOGICAL BOWEL NUTRITION STATUS      Incontinent Diet (see d/c summary)  AMBULATORY STATUS COMMUNICATION OF NEEDS Skin   Extensive Assist   Normal                       Personal Care Assistance Level of Assistance  Bathing, Feeding, Dressing Bathing Assistance: Limited assistance Feeding assistance: Independent Dressing Assistance: Limited assistance     Functional Limitations Info  Sight, Hearing, Speech Sight Info: Impaired Hearing Info: Adequate Speech Info: Adequate    SPECIAL CARE FACTORS FREQUENCY  OT (By licensed OT), PT (By licensed PT)     PT Frequency: 5x/week OT Frequency: 5x/week            Contractures Contractures Info: Not present    Additional Factors Info  Code Status, Allergies Code Status Info: FULL CODE Allergies Info: Hydrochlorothiazide, penicillins, Ciprofloxacin, Doxycycline, Hydrocodone-acetaminophen, Iodinated Contrast Media, Iodine-131, Lasix (furosemide), latex, statins, Carbamazepine, codeine, nickel, Sulfa Antibiotics, Sulfamethoxazole, Sulfonamide Derivatives           Current Medications (10/23/2022):  This is the current hospital active medication list Current Facility-Administered Medications  Medication Dose Route Frequency Provider Last Rate Last Admin   acetaminophen (TYLENOL) tablet 650 mg  650 mg Oral Q6H  PRN Howerter, Larkin Ina B, DO       Or   acetaminophen (TYLENOL) suppository 650 mg  650 mg Rectal Q6H PRN Howerter, Justin B, DO       felodipine (PLENDIL) 24 hr tablet 5 mg  5 mg Oral Daily Howerter, Justin B, DO   5 mg at 10/22/22 1050   FLUoxetine (PROZAC) capsule 10 mg  10 mg Oral Daily  Rai, Ripudeep K, MD   10 mg at 10/22/22 1130   irbesartan (AVAPRO) tablet 300 mg  300 mg Oral Daily Rai, Ripudeep K, MD       melatonin tablet 3 mg  3 mg Oral QHS PRN Howerter, Justin B, DO   3 mg at 10/22/22 2126   metoprolol tartrate (LOPRESSOR) tablet 50 mg  50 mg Oral BID Rai, Ripudeep K, MD   50 mg at 10/22/22 2311   QUEtiapine (SEROQUEL) tablet 50 mg  50 mg Oral QHS Rai, Ripudeep K, MD   50 mg at 10/22/22 2330   senna-docusate (Senokot-S) tablet 1 tablet  1 tablet Oral QHS PRN Rai, Vernelle Emerald, MD         Discharge Medications: Please see discharge summary for a list of discharge medications.  Relevant Imaging Results:  Relevant Lab Results:   Additional Information SSN [place], [place]

## 2022-10-23 NOTE — Progress Notes (Addendum)
Pt awoke and took off her telemetry monitor. She requested to have it off for the night even after RN explained its purpose. Tele orders have expired so tele d/c'd.  Also removed IV. Not currently on any IV medication- replacement not indicated.

## 2022-10-23 NOTE — Procedures (Signed)
PROCEDURE SUMMARY:  Successful US guided diagnostic and therapeutic right thoracentesis. Yielded 1.1 liters of amber fluid. Pt tolerated procedure well. No immediate complications.  Specimen was sent for labs. CXR ordered.  EBL < 5 mL  Docia Barrier PA-C 10/23/2022 4:35 PM

## 2022-10-24 DIAGNOSIS — D649 Anemia, unspecified: Secondary | ICD-10-CM | POA: Diagnosis not present

## 2022-10-24 DIAGNOSIS — W19XXXA Unspecified fall, initial encounter: Secondary | ICD-10-CM | POA: Diagnosis not present

## 2022-10-24 DIAGNOSIS — F411 Generalized anxiety disorder: Secondary | ICD-10-CM | POA: Diagnosis not present

## 2022-10-24 DIAGNOSIS — J90 Pleural effusion, not elsewhere classified: Secondary | ICD-10-CM | POA: Diagnosis not present

## 2022-10-24 LAB — BASIC METABOLIC PANEL
Anion gap: 7 (ref 5–15)
BUN: 15 mg/dL (ref 8–23)
CO2: 25 mmol/L (ref 22–32)
Calcium: 8.7 mg/dL — ABNORMAL LOW (ref 8.9–10.3)
Chloride: 102 mmol/L (ref 98–111)
Creatinine, Ser: 1 mg/dL (ref 0.44–1.00)
GFR, Estimated: 55 mL/min — ABNORMAL LOW (ref >60)
Glucose, Bld: 98 mg/dL (ref 70–99)
Potassium: 3.6 mmol/L (ref 3.5–5.1)
Sodium: 134 mmol/L — ABNORMAL LOW (ref 135–145)

## 2022-10-24 LAB — CBC
HCT: 30.1 % — ABNORMAL LOW (ref 36.0–46.0)
Hemoglobin: 10.2 g/dL — ABNORMAL LOW (ref 12.0–15.0)
MCH: 32.3 pg (ref 26.0–34.0)
MCHC: 33.9 g/dL (ref 30.0–36.0)
MCV: 95.3 fL (ref 80.0–100.0)
Platelets: 175 10*3/uL (ref 150–400)
RBC: 3.16 MIL/uL — ABNORMAL LOW (ref 3.87–5.11)
RDW: 14.4 % (ref 11.5–15.5)
WBC: 6.3 10*3/uL (ref 4.0–10.5)
nRBC: 0 % (ref 0.0–0.2)

## 2022-10-24 MED ORDER — FELODIPINE ER 5 MG PO TB24
10.0000 mg | ORAL_TABLET | Freq: Every day | ORAL | Status: DC
  Administered 2022-10-24: 10 mg via ORAL
  Filled 2022-10-24: qty 2
  Filled 2022-10-24: qty 1

## 2022-10-24 MED ORDER — RIVAROXABAN 15 MG PO TABS: 15.0000 mg | ORAL_TABLET | Freq: Every day | ORAL | Status: DC

## 2022-10-24 MED ORDER — ORAL CARE MOUTH RINSE: 15.0000 mL | OROMUCOSAL | Status: DC | PRN

## 2022-10-24 MED ORDER — HALOPERIDOL LACTATE 5 MG/ML IJ SOLN: 1.0000 mg | Freq: Four times a day (QID) | INTRAMUSCULAR | Status: DC | PRN

## 2022-10-24 MED ORDER — METOPROLOL TARTRATE 50 MG PO TABS
75.0000 mg | ORAL_TABLET | Freq: Two times a day (BID) | ORAL | Status: DC
  Administered 2022-10-24: 75 mg via ORAL
  Filled 2022-10-24: qty 1

## 2022-10-24 MED ORDER — METOPROLOL TARTRATE 50 MG PO TABS: 75.0000 mg | ORAL_TABLET | Freq: Two times a day (BID) | ORAL | Status: DC

## 2022-10-24 NOTE — Progress Notes (Signed)
[facility]                                                                              Tiffany Black, is a 89 y.o. female, DOB - 1933-11-27, [id number] Admit date - 10/20/2022    Outpatient Primary MD for the patient is Ginger Organ., MD  LOS - 1  days  No chief complaint on file.      Brief summary   Patient is a 89 year old with dementia, persistent A-fib on Xarelto, generalized anxiety disorder, essential hypertension who was recently admitted 1/17-1/23/2024 with suspected pneumonia and right-sided pleural effusion status post thoracentesis, hemothorax and was DC'd to SNF on 1/23.  She presented to ED with a fall that was unwitnessed and was found on the floor.  Significant memory issues with impulsiveness. CT head negative for acute intracranial abnormality, C-spine negative for fracture or dislocation Multiple imagings negative for displaced fracture or dislocation Chest x-ray showed moderate to large right pleural effusion, appears increased from the recent prior study, underlying pneumonic infiltrate could be hidden.  Cardiomegaly.    Assessment & Plan    Principal Problem: Recurrent right-sided pleural effusion. -Recent admission 1/17-1/23, had undergone thoracentesis, 900 cc dark red exudative culture negative fluid removed.  It was felt that her pleural effusion was likely from hemothorax after she sustained a fall with rib fractures although difficult to rule out malignancy.  Pulmonology had recommended repeating chest x-ray, holding Xarelto in setting of outpatient thoracentesis with repeat fluid study.  She was discharged to SNF -CXR :moderate to large right-sided pleural effusion, appears increased from the recent prior study -Underwent ultrasound-guided diagnostic and therapeutic right-sided thoracentesis, 1.1 L removed IR, cultures negative so far.  Follow cytology -Appointment with pulmonology, Dr. Erin Fulling rescheduled to 11/01/22 at [facility]    Active problems Anemia, normocytic -Hemoglobin 10.7 on admission, was 12.7 on 10/03/2022 Anemia panel consistent with anemia of chronic disease -H&H stable  Persistent atrial fibrillation --Heart rate controlled, continue metoprolol  -On chronic anticoagulation, will resume Xarelto   Essential hypertension -BP still somewhat related, increased Plendil to 10 mg daily, continue Avapro 300 mg daily, increased Lopressor to 75 mg twice daily    Generalized anxiety disorder, dementia  --Continue Seroquel 50 mg at bedtime -Prozac held due to hyponatremia  Hyponatremia -On admission sodium 133, improving, tolerating diet -Prozac held due to hyponatremia    Estimated body mass index is 22.89 kg/m as calculated from the following:   Height as of this encounter: 5' 5"$  (1.651 m).   Weight as of this encounter: 62.4 kg.  Code Status: Full code DVT Prophylaxis:  SCDs Start: 10/20/22 2349   Level of Care: Level of care: Progressive Family Communication: Updated patient's son at the bedside on 2/12 Disposition Plan:      Remains inpatient appropriate: Plan to return back to SNF, possibly today awaiting SNF authorization  Procedures:  10/23/2022 : ultrasound-guided diagnostic and therapeutic right-sided thoracentesis  Consultants:   IR  Antimicrobials: None   Medications  felodipine  10 mg Oral Daily   FLUoxetine  10 mg Oral Daily   irbesartan  300 mg Oral Daily  metoprolol tartrate  75 mg Oral BID   QUEtiapine  50 mg Oral QHS      Subjective:   Tiffany Black was seen and examined today.  BP uncontrolled, no headache, blurry vision or any chest pain, shortness of breath, currently no acute complaints.  Underwent thoracentesis on 2/12..   Objective:   Vitals:   10/23/22 1915 10/23/22 2325 10/24/22 0500 10/24/22 0845  BP: (!) 162/79 (!) 178/108 (!) 175/112 (!) 162/90  Pulse: 92 (!) 106 75   Resp: 18 16 16   $ Temp: 97.6 F (36.4 C) 97.8 F (36.6 C) 97.7 F (36.5 C)  97.8 F (36.6 C)  TempSrc: Oral Oral Oral Oral  SpO2: 91% 92% 91%   Weight:   62.4 kg   Height:       No intake or output data in the 24 hours ending 10/24/22 1110    Wt Readings from Last 3 Encounters:  10/24/22 62.4 kg  10/03/22 60 kg  09/27/22 60 kg   Physical Exam General: Alert and oriented x self and person, NAD, appears comfortable Cardiovascular: S1 S2 clear, RRR.  Respiratory: Diminished breath sound at the bases R>L, no wheezing Gastrointestinal: Soft, nontender, nondistended, NBS Ext: no pedal edema bilaterally Neuro: no new deficits psych: dementia      Data Reviewed:  I have personally reviewed following labs    CBC Lab Results  Component Value Date   WBC 6.3 10/24/2022   RBC 3.16 (L) 10/24/2022   HGB 10.2 (L) 10/24/2022   HCT 30.1 (L) 10/24/2022   MCV 95.3 10/24/2022   MCH 32.3 10/24/2022   PLT 175 10/24/2022   MCHC 33.9 10/24/2022   RDW 14.4 10/24/2022   LYMPHSABS 1.1 10/21/2022   MONOABS 0.6 10/21/2022   EOSABS 0.4 10/21/2022   BASOSABS 0.1 [id number]     Last metabolic panel Lab Results  Component Value Date   NA 134 (L) 10/24/2022   K 3.6 10/24/2022   CL 102 10/24/2022   CO2 25 10/24/2022   BUN 15 10/24/2022   CREATININE 1.00 10/24/2022   GLUCOSE 98 10/24/2022   GFRNONAA 55 (L) 10/24/2022   GFRAA 50 (L) 12/26/2019   CALCIUM 8.7 (L) 10/24/2022   PHOS 3.3 09/30/2022   PROT 5.7 (L) 10/21/2022   ALBUMIN 3.2 (L) 10/21/2022   BILITOT 1.2 10/21/2022   ALKPHOS 73 10/21/2022   AST 24 10/21/2022   ALT 23 10/21/2022   ANIONGAP 7 10/24/2022    CBG (last 3)  No results for input(s): "GLUCAP" in the last 72 hours.    Coagulation Profile: Recent Labs  Lab 10/20/22 2101 10/21/22 0632  INR 2.3* 1.4*     Radiology Studies: I have personally reviewed the imaging studies  IR THORACENTESIS ASP PLEURAL SPACE W/IMG GUIDE  Result Date: 10/23/2022 INDICATION: Patient with recent admission for pneumonia, right-sided pleural effusion  requiring right thoracentesis now admitted with recurrent pleural effusion. Request is made for diagnostic and therapeutic thoracentesis. EXAM: ULTRASOUND GUIDED RIGHT THORACENTESIS MEDICATIONS: 10 mL 1% lidocaine COMPLICATIONS: None immediate. PROCEDURE: An ultrasound guided thoracentesis was thoroughly discussed with the patient and questions answered. The benefits, risks, alternatives and complications were also discussed. The patient understands and wishes to proceed with the procedure. Written consent was obtained. Ultrasound was performed to localize and mark an adequate pocket of fluid in the right chest. The area was then prepped and draped in the normal sterile fashion. 1% Lidocaine was used for local anesthesia. Under ultrasound guidance a 6 Fr Safe-T-Centesis catheter was  introduced. Thoracentesis was performed. The catheter was removed and a dressing applied. FINDINGS: A total of approximately 1.1 liters of amber fluid was removed. Samples were sent to the laboratory as requested by the clinical team. IMPRESSION: Successful ultrasound guided diagnostic and therapeutic right thoracentesis yielding 1.1 liters of pleural fluid. Read by: Brynda Greathouse PA-C Electronically Signed   By: Corrie Mckusick D.O.   On: 10/23/2022 16:56   DG Chest 1 View  Result Date: 10/23/2022 CLINICAL DATA:  Status post thoracentesis. EXAM: CHEST  1 VIEW COMPARISON:  Preprocedure x-ray 10/20/2022 FINDINGS: Decreasing right pleural effusion after thoracentesis with some residual fluid. No pneumothorax or edema. Enlarged cardiopericardial silhouette. Calcified aorta. Overlapping cardiac leads. IMPRESSION: Decreasing right pleural effusion after thoracentesis with some residual fluid. No pneumothorax. Electronically Signed   By: Jill Side M.D.   On: 10/23/2022 14:28       Norval Slaven M.D. [facility] 10/24/2022, 11:10 AM  Available via Epic secure chat 7am-7pm After 7 pm, please refer to night coverage provider  listed on amion.

## 2022-10-24 NOTE — TOC Transition Note (Addendum)
Transition of Care [facility]) - CM/SW Discharge Note   Patient Details  Name: Tiffany Black MRN: [id number] Date of Birth: Jun 13, 1934  Transition of Care [facility] CM/SW Contact:  Vinie Sill, LCSW Phone Number: 10/24/2022, 3:29 PM   Clinical Narrative:     Patient will Discharge to: [facility]  Discharge Date: 10/24/22 Family Notified: patient's son Transport By: Corey Harold  Per MD patient is ready for discharge. RN, patient, and facility notified of discharge. Discharge Summary sent to facility. RN given number for [phone]. Ambulance transport requested for patient.   Clinical Social Worker signing off.  Thurmond Butts, MSW, LCSW Clinical Social Worker     Final next level of care: Skilled Nursing Facility Barriers to Discharge: Barriers Resolved   Patient Goals and CMS Choice      Discharge Placement                         Discharge Plan and Services Additional resources added to the After Visit Summary for                                       Social Determinants of Health (SDOH) Interventions SDOH Screenings   Food Insecurity: No Food Insecurity (10/22/2022)  Housing: Low Risk  (10/22/2022)  Transportation Needs: No Transportation Needs (10/22/2022)  Utilities: Not At Risk (10/22/2022)  Tobacco Use: Low Risk  (10/23/2022)     Readmission Risk Interventions    08/29/2022   10:19 AM  Readmission Risk Prevention Plan  Transportation Screening Complete  Medication Review ([place]) Complete  PCP or Specialist appointment within 3-5 days of discharge Complete  HRI or Home Care Consult Complete  SW Recovery Care/Counseling Consult Complete  Palliative Care Screening Not [place] Complete

## 2022-10-24 NOTE — Discharge Summary (Signed)
Physician Discharge Summary   Patient: Tiffany Black MRN: [id number] DOB: 02/18/1934  Admit date:     10/20/2022  Discharge date: 10/24/22  Discharge Physician: Estill Cotta   PCP: Ginger Organ., MD   Recommendations at discharge:    Need to follow up with Pulmonology, Dr Erin Fulling on 2/21 at [facility], appt scheduled  Repeat CXR in 1 week to assess for recurrence of pleural effusion  Pulmonology recommended outpatient thoracocentesis with fluid studies including cytology if pleural fluid recurs. Follow up with Dr Erin Fulling.  Delirium precautions   Discharge Diagnoses:    Acute anemia   Recurrent right pleural effusion s/p thoracentesis    Essential hypertension   Persistent atrial fibrillation (HCC)   GAD (generalized anxiety disorder)   Fall   Hyponatremia    Dementia    Hospital Course:  Patient is a 89 year old with dementia, persistent A-fib on Xarelto, generalized anxiety disorder, essential hypertension who was recently admitted 1/17-1/23/2024 with suspected pneumonia and right-sided pleural effusion status post thoracentesis, hemothorax and was DC'd to SNF on 1/23.  She presented to ED with a fall that was unwitnessed and was found on the floor.  Significant memory issues with impulsiveness. CT head negative for acute intracranial abnormality, C-spine negative for fracture or dislocation Multiple imagings negative for displaced fracture or dislocation Chest x-ray showed moderate to large right pleural effusion, appears increased from the recent prior study, underlying pneumonic infiltrate could be hidden.  Cardiomegaly.  Assessment and Plan:  Recurrent right-sided pleural effusion. -Recent admission 1/17-1/23, had undergone thoracentesis, 900 cc dark red exudative culture negative fluid removed.  It was felt that her pleural effusion was likely from hemothorax after she sustained a fall with rib fractures although difficult to rule out malignancy.  Pulmonology had recommended  repeating chest x-ray, holding Xarelto in setting of outpatient thoracentesis with repeat fluid study.  She was discharged to SNF -CXR showed moderate to large right-sided pleural effusion, appears increased from the recent prior study -Underwent ultrasound-guided diagnostic and therapeutic right-sided thoracentesis, 1.1 L removed IR, cultures negative so far.  Follow cytology -Appointment with pulmonology, Dr. Erin Fulling rescheduled to 11/01/22 at 2PM     Anemia, normocytic -Hemoglobin 10.7 on admission, was 12.7 on 10/03/2022 Anemia panel consistent with anemia of chronic disease -H&H stable   Persistent atrial fibrillation --Heart rate controlled, continue metoprolol  -On chronic anticoagulation, resumed Xarelto   Essential hypertension -BP still somewhat related, increased Plendil to 10 mg daily, continue Avapro 300 mg daily, increased Lopressor to 75 mg twice daily    Generalized anxiety disorder, dementia  --Continue Seroquel 50 mg at bedtime -Prozac held due to hyponatremia   Hyponatremia -On admission sodium 133, improving, tolerating diet -Prozac held due to hyponatremia     Estimated body mass index is 22.89 kg/m as calculated from the following:   Height as of this encounter: 5' 5"$  (1.651 m).   Weight as of this encounter: 62.4 kg.       Pain control - [employer] Controlled Substance Reporting System database was reviewed. and patient was instructed, not to drive, operate heavy machinery, perform activities at heights, swimming or participation in water activities or provide baby-sitting services while on Pain, Sleep and Anxiety Medications; until their outpatient Physician has advised to do so again. Also recommended to not to take more than prescribed Pain, Sleep and Anxiety Medications.  Consultants: IR  Procedures performed: right sided thoracentesis  Disposition: Skilled nursing facility Diet recommendation:  Discharge Diet Orders (From admission, onward)  Start     Ordered   10/24/22 0000  Diet - low sodium heart healthy        10/24/22 1515            DISCHARGE MEDICATION: Allergies as of 10/24/2022       Reactions   Hydrochlorothiazide Hives   Penicillins Hives   Has patient had a PCN reaction causing immediate rash, facial/tongue/throat swelling, SOB or lightheadedness with hypotension: Yes Has patient had a PCN reaction causing severe rash involving mucus membranes or skin necrosis: No Has patient had a PCN reaction that required hospitalization No Has patient had a PCN reaction occurring within the last 10 years: No If all of the above answers are "NO", then may proceed with Cephalosporin use.   Ciprofloxacin Other (See Comments), Hypertension   Blood pressure issues- patient doesn't recall if it increased OR decreased it   Doxycycline Diarrhea, Nausea And Vomiting   Hydrocodone-acetaminophen Other (See Comments)   Unknown reaction   Iodinated Contrast Media Diarrhea, Other (See Comments), Hypertension   Elevated the B/P   Iodine-131 Other (See Comments), Hypertension   Raised the blood pressure   Lasix [furosemide] Other (See Comments)   No reaction listed on order summary report from [facility]   Latex Itching   Statins Other (See Comments)   Muscle weakness   Carbamazepine Other (See Comments), Rash   Flushed blood pressure medication out of system   Codeine Nausea And Vomiting   Nickel Rash   Sulfa Antibiotics Hives, Rash   Sulfamethoxazole Hives, Rash   Sulfonamide Derivatives Hives, Rash           Medication List     STOP taking these medications    FLUoxetine 10 MG capsule Commonly known as: PROZAC       TAKE these medications    acetaminophen 500 MG tablet Commonly known as: TYLENOL Take 2 tablets (1,000 mg total) by mouth every 8 (eight) hours for 5 days, THEN 2 tablets (1,000 mg total) every 8 (eight) hours as needed for up to 25 days. Start taking on: October 02, 2022   diclofenac Sodium 1 %  Gel Commonly known as: VOLTAREN Apply 2 g topically 4 (four) times daily. To right wrist What changed:  when to take this reasons to take this additional instructions   felodipine 10 MG 24 hr tablet Commonly known as: PLENDIL Take 1 tablet (10 mg total) by mouth daily.   metoprolol tartrate 50 MG tablet Commonly known as: LOPRESSOR Take 1.5 tablets (75 mg total) by mouth 2 (two) times daily. What changed: how much to take   QUEtiapine 50 MG tablet Commonly known as: SEROQUEL Take 1 tablet (50 mg total) by mouth at bedtime.   Rivaroxaban 15 MG Tabs tablet Commonly known as: Xarelto Take 1 tablet (15 mg total) by mouth daily with supper.   senna-docusate 8.6-50 MG tablet Commonly known as: Senokot-S Take 1 tablet by mouth at bedtime as needed for mild constipation.   valsartan 320 MG tablet Commonly known as: DIOVAN Take 1 tablet (320 mg total) by mouth daily.        Follow-up Information     Freddi Starr, MD Follow up on 11/01/2022.   Specialty: Pulmonary Disease Why: at 2:00 pm, for hospital follow-up Contact information: [address] [place] [address] [phone]         Ginger Organ., MD. Schedule an appointment as soon as possible for a visit in 2 week(s).  Specialty: Internal Medicine Why: for hospital follow-up Contact information: [place] [address] [phone]                Discharge Exam: Filed Weights   10/22/22 0301 10/23/22 0412 10/24/22 0500  Weight: 64.8 kg 65 kg 62.4 kg   S: No acute CP or shortness of breath. Has dementia with sundowning.   BP (!) 162/90 (BP Location: Right Arm)   Pulse 75   Temp 97.8 F (36.6 C) (Oral)   Resp 16   Ht 5' 5"$  (1.651 m)   Wt 62.4 kg   SpO2 91%   BMI 22.89 kg/m   Physical Exam General: Alert and oriented x self Cardiovascular: S1 S2 clear, RRR.  Respiratory: dec BS at bases  Gastrointestinal: Soft, nontender, nondistended,  NBS Ext: no pedal edema bilaterally Neuro: no new deficits Psych: dementia    Condition at discharge: fair  The results of significant diagnostics from this hospitalization (including imaging, microbiology, ancillary and laboratory) are listed below for reference.   Imaging Studies: IR THORACENTESIS ASP PLEURAL SPACE W/IMG GUIDE  Result Date: 10/23/2022 INDICATION: Patient with recent admission for pneumonia, right-sided pleural effusion requiring right thoracentesis now admitted with recurrent pleural effusion. Request is made for diagnostic and therapeutic thoracentesis. EXAM: ULTRASOUND GUIDED RIGHT THORACENTESIS MEDICATIONS: 10 mL 1% lidocaine COMPLICATIONS: None immediate. PROCEDURE: An ultrasound guided thoracentesis was thoroughly discussed with the patient and questions answered. The benefits, risks, alternatives and complications were also discussed. The patient understands and wishes to proceed with the procedure. Written consent was obtained. Ultrasound was performed to localize and mark an adequate pocket of fluid in the right chest. The area was then prepped and draped in the normal sterile fashion. 1% Lidocaine was used for local anesthesia. Under ultrasound guidance a 6 Fr Safe-T-Centesis catheter was introduced. Thoracentesis was performed. The catheter was removed and a dressing applied. FINDINGS: A total of approximately 1.1 liters of amber fluid was removed. Samples were sent to the laboratory as requested by the clinical team. IMPRESSION: Successful ultrasound guided diagnostic and therapeutic right thoracentesis yielding 1.1 liters of pleural fluid. Read by: Brynda Greathouse PA-C Electronically Signed   By: Corrie Mckusick D.O.   On: 10/23/2022 16:56   DG Chest 1 View  Result Date: 10/23/2022 CLINICAL DATA:  Status post thoracentesis. EXAM: CHEST  1 VIEW COMPARISON:  Preprocedure x-ray 10/20/2022 FINDINGS: Decreasing right pleural effusion after thoracentesis with some residual  fluid. No pneumothorax or edema. Enlarged cardiopericardial silhouette. Calcified aorta. Overlapping cardiac leads. IMPRESSION: Decreasing right pleural effusion after thoracentesis with some residual fluid. No pneumothorax. Electronically Signed   By: Jill Side M.D.   On: 10/23/2022 14:28   CT Cervical Spine Wo Contrast  Result Date: 10/20/2022 CLINICAL DATA:  unwitnessed fall EXAM: CT CERVICAL SPINE WITHOUT CONTRAST TECHNIQUE: Multidetector CT imaging of the cervical spine was performed without intravenous contrast. Multiplanar CT image reconstructions were also generated. RADIATION DOSE REDUCTION: This exam was performed according to the departmental dose-optimization program which includes automated exposure control, adjustment of the mA and/or kV according to patient size and/or use of iterative reconstruction technique. COMPARISON:  CT C-spine 10/03/2022 FINDINGS: Alignment: Normal. Skull base and vertebrae: Multilevel mild-to-moderate degenerative changes spine. No severe associated neural foraminal or central canal stenosis. No acute fracture. No aggressive appearing focal osseous lesion or focal pathologic process. Soft tissues and spinal canal: No prevertebral fluid or swelling. No visible canal hematoma. Upper chest: At least moderate volume right pleural effusion. Other: Atherosclerotic  plaque of the aortic arch. Aortic arch measures up to at least 3.4 cm. The right brachiocephalic artery measures up to 1.6 cm. IMPRESSION: 1. No acute displaced fracture or traumatic listhesis of the cervical spine. 2. At least moderate volume right pleural effusion. Please see separately dictated chest x-ray 10/20/2022. 3. Right brachiocephalic artery measures at the upper limits of normal (1.6 cm). 4. Aortic Atherosclerosis (ICD10-I70.0). Electronically Signed   By: Iven Finn M.D.   On: 10/20/2022 21:55   CT Head Wo Contrast  Result Date: 10/20/2022 CLINICAL DATA:  Unwitnessed fall EXAM: CT HEAD WITHOUT  CONTRAST TECHNIQUE: Contiguous axial images were obtained from the base of the skull through the vertex without intravenous contrast. RADIATION DOSE REDUCTION: This exam was performed according to the departmental dose-optimization program which includes automated exposure control, adjustment of the mA and/or kV according to patient size and/or use of iterative reconstruction technique. COMPARISON:  Head CT 10/03/2022 FINDINGS: Brain: No evidence of acute infarction, hemorrhage, hydrocephalus, extra-axial collection or mass lesion/mass effect. There is mild diffuse atrophy. There is moderate periventricular and deep white matter hypodensity, likely chronic small vessel ischemic change. Vascular: Atherosclerotic calcifications are present within the cavernous internal carotid arteries. Skull: Normal. Negative for fracture or focal lesion. Sinuses/Orbits: Chronic appearing opacities are again noted in the left maxillary sinus. No air-fluid levels. Orbits are within normal limits. Other: None. IMPRESSION: 1. No acute intracranial process. 2. Atrophy and chronic small vessel ischemic changes. Electronically Signed   By: Ronney Asters M.D.   On: 10/20/2022 21:54   DG Chest Portable 1 View  Result Date: 10/20/2022 CLINICAL DATA:  Polytrauma injury from unwitnessed fall. EXAM: RIGHT FOOT COMPLETE - 3+ VIEW; PORTABLE PELVIS 1-2 VIEWS; PORTABLE CHEST - 1 VIEW; RIGHT TIBIA AND FIBULA - 2 VIEW COMPARISON:  Portable chest films from 10/03/2022 and 10/01/2022, portable AP pelvis 10/03/2022. No prior right foot or right tib fib series. FINDINGS: CHEST AP PORTABLE AT 9:17 p.m.: Moderate-to-large right pleural effusion is again noted and appears to have increased, now extending to the level of the mid hilum. There is obscuration of the lower half of the right chest. Underlying pneumonic infiltrate could be hidden. Remaining lungs are clear with COPD. The heart is enlarged but unchanged. Central vascular markings are normal  caliber. There is aortic tortuosity and calcification with stable mediastinum. Osteopenia and degenerative change thoracic spine.  No pneumothorax. PORTABLE AP PELVIS, SINGLE VIEW: There is osteopenia without evidence of displaced fractures of the proximal femurs, pelvis and sacrum. There is spurring at the SI joints, pubic symphysis. Again noted are mild-to-moderate right and mild left nonerosive bilateral hip DJD. There are enthesopathic changes of the pelvis. Advanced degenerative change in the visualized lower lumbar spine is noted. There are pelvic phleboliths. Comparison to the recent prior study reveals no significant interval change. AP LAT RIGHT TIBIA FIBULA SERIES IN 4 FILMS: There is mild generalized soft tissue stranding edema throughout the right foreleg. This continues over the ankle and foot where the edema is moderate. Underlying bones show osteopenia without displaced fractures or primary bone lesion. RIGHT FOOT, ROUTINE THREE VIEWS: Diffuse moderate edema. There is osteopenia without evidence of fractures. There is mild hallux valgus with otherwise normal interosseous alignment. There is mild degenerative arthrosis in the midfoot and forefoot but no erosive arthropathy. Dorsal and plantar calcaneal enthesopathy is noted and appears noninflammatory. IMPRESSION: 1. Moderate-to-large right pleural effusion, appears increased from the recent prior study. Underlying pneumonic infiltrate could be hidden. 2. Cardiomegaly and aortic  atherosclerosis. 3. Osteopenia and degenerative change without evidence of fractures of the AP pelvis, proximal femurs, right tibia fibula and right foot. 4. Soft tissue edema throughout the right foreleg and foot. 5. Pelvic and calcaneal enthesopathic change. 6. Hallux valgus. Mild degenerative changes of the midfoot and forefoot. Electronically Signed   By: Telford Nab M.D.   On: 10/20/2022 21:54   DG Pelvis Portable  Result Date: 10/20/2022 CLINICAL DATA:  Polytrauma  injury from unwitnessed fall. EXAM: RIGHT FOOT COMPLETE - 3+ VIEW; PORTABLE PELVIS 1-2 VIEWS; PORTABLE CHEST - 1 VIEW; RIGHT TIBIA AND FIBULA - 2 VIEW COMPARISON:  Portable chest films from 10/03/2022 and 10/01/2022, portable AP pelvis 10/03/2022. No prior right foot or right tib fib series. FINDINGS: CHEST AP PORTABLE AT 9:17 p.m.: Moderate-to-large right pleural effusion is again noted and appears to have increased, now extending to the level of the mid hilum. There is obscuration of the lower half of the right chest. Underlying pneumonic infiltrate could be hidden. Remaining lungs are clear with COPD. The heart is enlarged but unchanged. Central vascular markings are normal caliber. There is aortic tortuosity and calcification with stable mediastinum. Osteopenia and degenerative change thoracic spine.  No pneumothorax. PORTABLE AP PELVIS, SINGLE VIEW: There is osteopenia without evidence of displaced fractures of the proximal femurs, pelvis and sacrum. There is spurring at the SI joints, pubic symphysis. Again noted are mild-to-moderate right and mild left nonerosive bilateral hip DJD. There are enthesopathic changes of the pelvis. Advanced degenerative change in the visualized lower lumbar spine is noted. There are pelvic phleboliths. Comparison to the recent prior study reveals no significant interval change. AP LAT RIGHT TIBIA FIBULA SERIES IN 4 FILMS: There is mild generalized soft tissue stranding edema throughout the right foreleg. This continues over the ankle and foot where the edema is moderate. Underlying bones show osteopenia without displaced fractures or primary bone lesion. RIGHT FOOT, ROUTINE THREE VIEWS: Diffuse moderate edema. There is osteopenia without evidence of fractures. There is mild hallux valgus with otherwise normal interosseous alignment. There is mild degenerative arthrosis in the midfoot and forefoot but no erosive arthropathy. Dorsal and plantar calcaneal enthesopathy is noted and  appears noninflammatory. IMPRESSION: 1. Moderate-to-large right pleural effusion, appears increased from the recent prior study. Underlying pneumonic infiltrate could be hidden. 2. Cardiomegaly and aortic atherosclerosis. 3. Osteopenia and degenerative change without evidence of fractures of the AP pelvis, proximal femurs, right tibia fibula and right foot. 4. Soft tissue edema throughout the right foreleg and foot. 5. Pelvic and calcaneal enthesopathic change. 6. Hallux valgus. Mild degenerative changes of the midfoot and forefoot. Electronically Signed   By: Telford Nab M.D.   On: 10/20/2022 21:54   DG Foot Complete Right  Result Date: 10/20/2022 CLINICAL DATA:  Polytrauma injury from unwitnessed fall. EXAM: RIGHT FOOT COMPLETE - 3+ VIEW; PORTABLE PELVIS 1-2 VIEWS; PORTABLE CHEST - 1 VIEW; RIGHT TIBIA AND FIBULA - 2 VIEW COMPARISON:  Portable chest films from 10/03/2022 and 10/01/2022, portable AP pelvis 10/03/2022. No prior right foot or right tib fib series. FINDINGS: CHEST AP PORTABLE AT 9:17 p.m.: Moderate-to-large right pleural effusion is again noted and appears to have increased, now extending to the level of the mid hilum. There is obscuration of the lower half of the right chest. Underlying pneumonic infiltrate could be hidden. Remaining lungs are clear with COPD. The heart is enlarged but unchanged. Central vascular markings are normal caliber. There is aortic tortuosity and calcification with stable mediastinum. Osteopenia and degenerative change  thoracic spine.  No pneumothorax. PORTABLE AP PELVIS, SINGLE VIEW: There is osteopenia without evidence of displaced fractures of the proximal femurs, pelvis and sacrum. There is spurring at the SI joints, pubic symphysis. Again noted are mild-to-moderate right and mild left nonerosive bilateral hip DJD. There are enthesopathic changes of the pelvis. Advanced degenerative change in the visualized lower lumbar spine is noted. There are pelvic phleboliths.  Comparison to the recent prior study reveals no significant interval change. AP LAT RIGHT TIBIA FIBULA SERIES IN 4 FILMS: There is mild generalized soft tissue stranding edema throughout the right foreleg. This continues over the ankle and foot where the edema is moderate. Underlying bones show osteopenia without displaced fractures or primary bone lesion. RIGHT FOOT, ROUTINE THREE VIEWS: Diffuse moderate edema. There is osteopenia without evidence of fractures. There is mild hallux valgus with otherwise normal interosseous alignment. There is mild degenerative arthrosis in the midfoot and forefoot but no erosive arthropathy. Dorsal and plantar calcaneal enthesopathy is noted and appears noninflammatory. IMPRESSION: 1. Moderate-to-large right pleural effusion, appears increased from the recent prior study. Underlying pneumonic infiltrate could be hidden. 2. Cardiomegaly and aortic atherosclerosis. 3. Osteopenia and degenerative change without evidence of fractures of the AP pelvis, proximal femurs, right tibia fibula and right foot. 4. Soft tissue edema throughout the right foreleg and foot. 5. Pelvic and calcaneal enthesopathic change. 6. Hallux valgus. Mild degenerative changes of the midfoot and forefoot. Electronically Signed   By: Telford Nab M.D.   On: 10/20/2022 21:54   DG Tibia/Fibula Right  Result Date: 10/20/2022 CLINICAL DATA:  Polytrauma injury from unwitnessed fall. EXAM: RIGHT FOOT COMPLETE - 3+ VIEW; PORTABLE PELVIS 1-2 VIEWS; PORTABLE CHEST - 1 VIEW; RIGHT TIBIA AND FIBULA - 2 VIEW COMPARISON:  Portable chest films from 10/03/2022 and 10/01/2022, portable AP pelvis 10/03/2022. No prior right foot or right tib fib series. FINDINGS: CHEST AP PORTABLE AT 9:17 p.m.: Moderate-to-large right pleural effusion is again noted and appears to have increased, now extending to the level of the mid hilum. There is obscuration of the lower half of the right chest. Underlying pneumonic infiltrate could be  hidden. Remaining lungs are clear with COPD. The heart is enlarged but unchanged. Central vascular markings are normal caliber. There is aortic tortuosity and calcification with stable mediastinum. Osteopenia and degenerative change thoracic spine.  No pneumothorax. PORTABLE AP PELVIS, SINGLE VIEW: There is osteopenia without evidence of displaced fractures of the proximal femurs, pelvis and sacrum. There is spurring at the SI joints, pubic symphysis. Again noted are mild-to-moderate right and mild left nonerosive bilateral hip DJD. There are enthesopathic changes of the pelvis. Advanced degenerative change in the visualized lower lumbar spine is noted. There are pelvic phleboliths. Comparison to the recent prior study reveals no significant interval change. AP LAT RIGHT TIBIA FIBULA SERIES IN 4 FILMS: There is mild generalized soft tissue stranding edema throughout the right foreleg. This continues over the ankle and foot where the edema is moderate. Underlying bones show osteopenia without displaced fractures or primary bone lesion. RIGHT FOOT, ROUTINE THREE VIEWS: Diffuse moderate edema. There is osteopenia without evidence of fractures. There is mild hallux valgus with otherwise normal interosseous alignment. There is mild degenerative arthrosis in the midfoot and forefoot but no erosive arthropathy. Dorsal and plantar calcaneal enthesopathy is noted and appears noninflammatory. IMPRESSION: 1. Moderate-to-large right pleural effusion, appears increased from the recent prior study. Underlying pneumonic infiltrate could be hidden. 2. Cardiomegaly and aortic atherosclerosis. 3. Osteopenia and degenerative change without  evidence of fractures of the AP pelvis, proximal femurs, right tibia fibula and right foot. 4. Soft tissue edema throughout the right foreleg and foot. 5. Pelvic and calcaneal enthesopathic change. 6. Hallux valgus. Mild degenerative changes of the midfoot and forefoot. Electronically Signed   By:  Telford Nab M.D.   On: 10/20/2022 21:54   CT HEAD WO CONTRAST  Result Date: 10/03/2022 CLINICAL DATA:  [place] and hit back of head. Head and neck pain. Currently taking Xarelto. EXAM: CT HEAD WITHOUT CONTRAST CT CERVICAL SPINE WITHOUT CONTRAST TECHNIQUE: Multidetector CT imaging of the head and cervical spine was performed following the standard protocol without intravenous contrast. Multiplanar CT image reconstructions of the cervical spine were also generated. RADIATION DOSE REDUCTION: This exam was performed according to the departmental dose-optimization program which includes automated exposure control, adjustment of the mA and/or kV according to patient size and/or use of iterative reconstruction technique. COMPARISON:  Head CT 08/27/2022, CT angio head and neck and reconstructions 08/09/2022. FINDINGS: CT HEAD FINDINGS Brain: There is moderate cerebral atrophy with mild-to-moderate atrophic ventriculomegaly and moderate to severe small vessel disease of the cerebral white matter. Relatively mild cerebellar atrophy is noted. Senescent mineralization of the basal ganglia is again shown. No new asymmetry is seen concerning for an acute cortical based infarct, hemorrhage, mass or mass effect. Vascular: There are patchy calcifications in both carotid siphons, distal left vertebral artery. No hyperdense central vessels. Skull: The calvarium, skull base and orbits are intact. There is osteopenia. No focal skull lesions. Sinuses/Orbits: Chronic consolidation inferior left maxillary sinus. Other paranasal sinuses, bilateral mastoid air cells, and middle ear cavities are clear. Nasal septum deviates to the left. There are old lens replacements. Other: None. CT CERVICAL SPINE FINDINGS Alignment: Trace degenerative anterolisthesis is again noted unchanged at C3-4, C5-6, C6-7 and C7-T1. No traumatic or further listhesis is seen. Again noted is bone-on-bone anterior atlantodental joint space loss with reactive  osteophytes. Slight cervical dextroscoliosis. Skull base and vertebrae: No acute fracture is evident. No primary bone lesion or focal pathologic process apart from osteopenia. Soft tissues and spinal canal: No prevertebral fluid or swelling. No visible canal hematoma. There are calcifications in both proximal cervical ICAs, right-greater-than-left. No laryngeal mass. Disc levels: Mild disc space loss is again present C5-6 and C6-7 and otherwise normal disc heights. There is cervical spondylosis, with focally prominent anterior osteophytes C5-6 and C6-7, trace posterior endplate spurring and mild disc osteophyte complexes at both levels. Central disc bulge partially effaces the epidural space at C2-3 and was seen previously and unchanged. No herniated discs or cord compromise are observed. Advanced facet hypertrophy is redemonstrated, for the most part left-greater-than-right, with multilevel uncinate spurring. Acquired foraminal stenosis is mild on the left at C2-3, mild on the right at C3-4, moderate on the left at C4-5, moderate to severe on the left and mild on the right at C5-6, and moderate on the left and mild on the right at C6-7. Upper chest: Moderate layering right pleural effusion tracking to the apex, etiology indeterminate but the fluid is low in density and homogeneous. There is an unchanged 4 mm right apical noncalcified nodule best seen on series 4 axial 85. It is also unchanged since the cervical spine CT dated 03/23/2021. There are anterior bridging osteophytes in the upper thoracic spine. Other: None. IMPRESSION: 1. No acute intracranial CT findings or depressed skull fractures. 2. Stable atrophy and small-vessel disease. 3. Osteopenia and degenerative change without evidence of cervical fractures. 4. C2-3 central disc  bulge without significant mass effect, unchanged. 5. Moderate low-density layering right pleural effusion, etiology indeterminate. 6. Chronic consolidation inferior left maxillary  sinus. 7. Stable 4 mm right apical lung nodule, including compared to cervical spine CT 03/23/2021. Electronically Signed   By: Telford Nab M.D.   On: 10/03/2022 21:36   CT CERVICAL SPINE WO CONTRAST  Result Date: 10/03/2022 CLINICAL DATA:  [place] and hit back of head. Head and neck pain. Currently taking Xarelto. EXAM: CT HEAD WITHOUT CONTRAST CT CERVICAL SPINE WITHOUT CONTRAST TECHNIQUE: Multidetector CT imaging of the head and cervical spine was performed following the standard protocol without intravenous contrast. Multiplanar CT image reconstructions of the cervical spine were also generated. RADIATION DOSE REDUCTION: This exam was performed according to the departmental dose-optimization program which includes automated exposure control, adjustment of the mA and/or kV according to patient size and/or use of iterative reconstruction technique. COMPARISON:  Head CT 08/27/2022, CT angio head and neck and reconstructions 08/09/2022. FINDINGS: CT HEAD FINDINGS Brain: There is moderate cerebral atrophy with mild-to-moderate atrophic ventriculomegaly and moderate to severe small vessel disease of the cerebral white matter. Relatively mild cerebellar atrophy is noted. Senescent mineralization of the basal ganglia is again shown. No new asymmetry is seen concerning for an acute cortical based infarct, hemorrhage, mass or mass effect. Vascular: There are patchy calcifications in both carotid siphons, distal left vertebral artery. No hyperdense central vessels. Skull: The calvarium, skull base and orbits are intact. There is osteopenia. No focal skull lesions. Sinuses/Orbits: Chronic consolidation inferior left maxillary sinus. Other paranasal sinuses, bilateral mastoid air cells, and middle ear cavities are clear. Nasal septum deviates to the left. There are old lens replacements. Other: None. CT CERVICAL SPINE FINDINGS Alignment: Trace degenerative anterolisthesis is again noted unchanged at C3-4, C5-6, C6-7 and  C7-T1. No traumatic or further listhesis is seen. Again noted is bone-on-bone anterior atlantodental joint space loss with reactive osteophytes. Slight cervical dextroscoliosis. Skull base and vertebrae: No acute fracture is evident. No primary bone lesion or focal pathologic process apart from osteopenia. Soft tissues and spinal canal: No prevertebral fluid or swelling. No visible canal hematoma. There are calcifications in both proximal cervical ICAs, right-greater-than-left. No laryngeal mass. Disc levels: Mild disc space loss is again present C5-6 and C6-7 and otherwise normal disc heights. There is cervical spondylosis, with focally prominent anterior osteophytes C5-6 and C6-7, trace posterior endplate spurring and mild disc osteophyte complexes at both levels. Central disc bulge partially effaces the epidural space at C2-3 and was seen previously and unchanged. No herniated discs or cord compromise are observed. Advanced facet hypertrophy is redemonstrated, for the most part left-greater-than-right, with multilevel uncinate spurring. Acquired foraminal stenosis is mild on the left at C2-3, mild on the right at C3-4, moderate on the left at C4-5, moderate to severe on the left and mild on the right at C5-6, and moderate on the left and mild on the right at C6-7. Upper chest: Moderate layering right pleural effusion tracking to the apex, etiology indeterminate but the fluid is low in density and homogeneous. There is an unchanged 4 mm right apical noncalcified nodule best seen on series 4 axial 85. It is also unchanged since the cervical spine CT dated 03/23/2021. There are anterior bridging osteophytes in the upper thoracic spine. Other: None. IMPRESSION: 1. No acute intracranial CT findings or depressed skull fractures. 2. Stable atrophy and small-vessel disease. 3. Osteopenia and degenerative change without evidence of cervical fractures. 4. C2-3 central disc bulge without significant mass  effect, unchanged.  5. Moderate low-density layering right pleural effusion, etiology indeterminate. 6. Chronic consolidation inferior left maxillary sinus. 7. Stable 4 mm right apical lung nodule, including compared to cervical spine CT 03/23/2021. Electronically Signed   By: Telford Nab M.D.   On: 10/03/2022 21:36   DG Pelvis Portable  Result Date: 10/03/2022 CLINICAL DATA:  Status post fall. EXAM: PORTABLE PELVIS 1-2 VIEWS COMPARISON:  None Available. FINDINGS: There is no evidence of acute pelvic fracture or diastasis. No pelvic bone lesions are seen. Moderate to marked severity degenerative changes seen involving both hips, in the form of joint space narrowing and acetabular sclerosis. IMPRESSION: Moderate to marked severity degenerative changes involving both hips. Electronically Signed   By: Virgina Norfolk M.D.   On: 10/03/2022 21:03   DG Chest Port 1 View  Result Date: 10/03/2022 CLINICAL DATA:  Status post fall. EXAM: PORTABLE CHEST 1 VIEW COMPARISON:  October 01, 2022 FINDINGS: The cardiac silhouette is mildly enlarged and unchanged in size. Stable moderate to marked severity right basilar consolidation is seen with a stable moderate sized right pleural effusion. No pneumothorax is identified. Multilevel degenerative changes seen throughout the thoracic spine. No acute osseous abnormalities are identified. IMPRESSION: Stable moderate to marked severity right basilar consolidation with a stable moderate sized right pleural effusion. Electronically Signed   By: Virgina Norfolk M.D.   On: 10/03/2022 21:02   DG Wrist Complete Right  Result Date: 10/02/2022 CLINICAL DATA:  Right wrist pain. EXAM: RIGHT WRIST - COMPLETE 3+ VIEW COMPARISON:  Right hand radiographs-03/23/2021 FINDINGS: Peripherally corticated ossicle adjacent to the tip of the ulna as well as mild deformity involving the base of the second fourth and fifth metacarpals represents sequela of remote injury/fracture. No acute fracture or dislocation.  Moderate degenerative change involving the STT joints of the base of the thumb with joint space loss, subchondral sclerosis and osteophytosis. Mild degenerative change of the radiocarpal articulations. Chondrocalcinosis is seen within the TFCC. Mild diffuse soft tissue swelling about the wrist. No radiopaque foreign body. IMPRESSION: 1. No acute findings. 2. Old/healed fractures involving the base of the fourth and fifth metacarpals as well as the ulnar styloid process. 3. Degenerative change of the wrist as detailed above, most conspicuously involving the base of the thumb. 4. Chondrocalcinosis as could be seen in the setting of CPPD. Electronically Signed   By: Sandi Mariscal M.D.   On: 10/02/2022 09:55   DG CHEST PORT 1 VIEW  Result Date: 10/01/2022 CLINICAL DATA:  Pleural effusion, pneumonia. EXAM: PORTABLE CHEST 1 VIEW COMPARISON:  09/28/2022 FINDINGS: Lungs are adequately inflated as there is moderate opacification over the right mid to lower lung with hazy opacification over the mid to upper lung likely layering pleural fluid with basilar atelectasis. Infection over the left lung is possible. Left lung is clear. Cardiomediastinal silhouette and remainder of the exam is unchanged. IMPRESSION: Moderate opacification over the right mid to lower lung likely layering pleural fluid with basilar atelectasis. Infection within the right lung is possible. Electronically Signed   By: Marin Olp M.D.   On: 10/01/2022 13:53   IR THORACENTESIS ASP PLEURAL SPACE W/IMG GUIDE  Result Date: 09/28/2022 INDICATION: Right pleural effusion EXAM: ULTRASOUND GUIDED RIGHT THORACENTESIS MEDICATIONS: 7 cc 1% lidocaine COMPLICATIONS: None immediate. PROCEDURE: An ultrasound guided thoracentesis was thoroughly discussed with the patient and questions answered. The benefits, risks, alternatives and complications were also discussed. The patient understands and wishes to proceed with the procedure. Written consent was obtained.  Ultrasound  was performed to localize and mark an adequate pocket of fluid in the right chest. The area was then prepped and draped in the normal sterile fashion. 1% Lidocaine was used for local anesthesia. Under ultrasound guidance a 6 Fr Safe-T-Centesis catheter was introduced. Thoracentesis was performed. The catheter was removed and a dressing applied. FINDINGS: A total of approximately 900 mL of dark red fluid was removed. Samples were sent to the laboratory as requested by the clinical team. IMPRESSION: Successful ultrasound guided right thoracentesis yielding 900 mL of pleural fluid. Follow-up chest x-ray revealed no evidence of pneumothorax. Read by: Reatha Armour, PA-C Electronically Signed   By: Corrie Mckusick D.O.   On: 09/28/2022 17:19   DG Chest 1 View  Result Date: 09/28/2022 CLINICAL DATA:  Pleural effusion on the right.  Post thoracentesis. EXAM: CHEST  1 VIEW COMPARISON:  Chest x-ray 09/27/2022. FINDINGS: Decreased right pleural effusion. No visible pneumothorax. Similar enlarged cardiac silhouette. No acute osseous abnormality. Polyarticular degenerative change IMPRESSION: Decreased right pleural effusion. No visible pneumothorax. Electronically Signed   By: Margaretha Sheffield M.D.   On: 09/28/2022 15:47   CT Chest Wo Contrast  Result Date: 09/27/2022 CLINICAL DATA:  Right basilar infiltrate and effusion on recent chest x-ray EXAM: CT CHEST WITHOUT CONTRAST TECHNIQUE: Multidetector CT imaging of the chest was performed following the standard protocol without IV contrast. RADIATION DOSE REDUCTION: This exam was performed according to the departmental dose-optimization program which includes automated exposure control, adjustment of the mA and/or kV according to patient size and/or use of iterative reconstruction technique. COMPARISON:  Chest x-ray from earlier in the same day. FINDINGS: Cardiovascular: Limited due to lack of IV contrast. Atherosclerotic calcifications of the thoracic aorta are  noted. Coronary calcifications are seen. No cardiac enlargement is noted. Mediastinum/Nodes: The esophagus is within normal limits. Thoracic inlet is unremarkable. Scattered small mediastinal and hilar lymph nodes are noted likely reactive in nature. Lungs/Pleura: Left lung is well aerated without focal infiltrate or sizable effusion. Minimal scarring is noted in the left base. Right lung shows evidence of near complete collapse of the right lower lobe as well as collapse of the right middle lobe. Associated large effusion is noted. Some scattered calcifications are noted within the collapsed lower lobe consistent with prior granulomatous disease. A small less than 5 mm nodule is noted in the right upper lobe best seen on image number 28 of series 8. Upper Abdomen: Visualized upper abdomen shows no acute abnormality. Musculoskeletal: Degenerative changes of the thoracic spine are noted. Multiple healing rib fractures are identified on the right laterally and posterolaterally to include the 8 through eleventh ribs. No pneumothorax is noted. IMPRESSION: Consolidation involving the right middle and right lower lobes consistent with pneumonia and associated large effusion. Multiple right rib fractures are noted with callus formation consistent with healing. 4 mm right solid pulmonary nodule within the upper lobe. Per Fleischner Society Guidelines, if patient is low risk for malignancy, no routine follow-up imaging is recommended. If patient is high risk for malignancy, a non-contrast Chest CT at 12 months is optional. If performed and the nodule is stable at 12 months, no further follow-up is recommended. These guidelines do not apply to immunocompromised patients and patients with cancer. Follow up in patients with significant comorbidities as clinically warranted. For lung cancer screening, adhere to Lung-RADS guidelines. Reference: Radiology. 2017; 284(1):228-43. Changes consistent with prior granulomatous disease.  Aortic Atherosclerosis (ICD10-I70.0). Electronically Signed   By: Inez Catalina M.D.   On: 09/27/2022 19:44  DG Chest 2 View  Result Date: 09/27/2022 CLINICAL DATA:  Follow up pneumonia EXAM: CHEST - 2 VIEW COMPARISON:  One-view x-ray 09/26/2022 and older FINDINGS: Moderate right pleural effusion. Adjacent opacity. Hyperinflation. Left lung is clear. No edema. Enlarged cardiopericardial silhouette with tortuous and ectatic aorta. Osteopenia with degenerative changes. IMPRESSION: No significant interval change when adjusting for technique. Electronically Signed   By: Jill Side M.D.   On: 09/27/2022 18:44   DG Chest Portable 1 View  Result Date: 09/26/2022 CLINICAL DATA:  Dizziness and shortness of breath EXAM: PORTABLE CHEST 1 VIEW COMPARISON:  08/26/2022 FINDINGS: Developing moderate right pleural effusion with adjacent opacity. Hyperinflation. Chronic interstitial changes. No consolidation in the left lung. No pneumothorax. Enlarged cardiopericardial silhouette with calcified aorta. Overlapping cardiac leads. The left inferior costophrenic angle is clipped at the edge of the film. IMPRESSION: Developing moderate right pleural effusion with adjacent opacity. Recommend follow-up Electronically Signed   By: Jill Side M.D.   On: 09/26/2022 16:09    Microbiology: Results for orders placed or performed during the hospital encounter of 10/20/22  Resp panel by RT-PCR (RSV, Flu A&B, Covid) Anterior Nasal Swab     Status: None   Collection Time: 10/20/22  9:01 PM   Specimen: Anterior Nasal Swab  Result Value Ref Range Status   SARS Coronavirus 2 by RT PCR NEGATIVE NEGATIVE Final   Influenza A by PCR NEGATIVE NEGATIVE Final   Influenza B by PCR NEGATIVE NEGATIVE Final    Comment: (NOTE) The Xpert Xpress SARS-CoV-2/FLU/RSV plus assay is intended as an aid in the diagnosis of influenza from Nasopharyngeal swab specimens and should not be used as a sole basis for treatment. Nasal washings  and aspirates are unacceptable for Xpert Xpress SARS-CoV-2/FLU/RSV testing.  Fact Sheet for Patients: EntrepreneurPulse.com.au  Fact Sheet for Healthcare Providers: IncredibleEmployment.be  This test is not yet approved or cleared by the [place] FDA and has been authorized for detection and/or diagnosis of SARS-CoV-2 by FDA under an Emergency Use Authorization (EUA). This EUA will remain in effect (meaning this test can be used) for the duration of the COVID-19 declaration under Section 564(b)(1) of the Act, 21 U.S.C. section 360bbb-3(b)(1), unless the authorization is terminated or revoked.     Resp Syncytial Virus by PCR NEGATIVE NEGATIVE Final    Comment: (NOTE) Fact Sheet for Patients: EntrepreneurPulse.com.au  Fact Sheet for Healthcare Providers: IncredibleEmployment.be  This test is not yet approved or cleared by the [place] FDA and has been authorized for detection and/or diagnosis of SARS-CoV-2 by FDA under an Emergency Use Authorization (EUA). This EUA will remain in effect (meaning this test can be used) for the duration of the COVID-19 declaration under Section 564(b)(1) of the Act, 21 U.S.C. section 360bbb-3(b)(1), unless the authorization is terminated or revoked.  Performed at [facility], [place] [address], [place], [place] [address]   Body fluid culture w Gram Stain     Status: None (Preliminary result)   Collection Time: 10/23/22  4:21 PM   Specimen: Lung, Right; Pleural Fluid  Result Value Ref Range Status   Specimen Description FLUID PLEURAL RIGHT  Final   Special Requests NONE  Final   Gram Stain NO WBC SEEN NO ORGANISMS SEEN   Final   Culture   Final    NO GROWTH < 24 HOURS Performed at [facility], [place] [address], [place], [place] [address]    Report Status PENDING  Incomplete    Labs: CBC: Recent  Labs  Lab 10/20/22 2101 10/20/22 2129  10/21/22 0632 10/24/22 0059  WBC 6.1  --  5.7 6.3  NEUTROABS 4.0  --  3.6  --   HGB 10.7* 11.2* 10.8* 10.2*  HCT 33.0* 33.0* 33.3* 30.1*  MCV 98.8  --  97.9 95.3  PLT 219  --  241 0000000   Basic Metabolic Panel: Recent Labs  Lab 10/20/22 2101 10/20/22 2129 10/21/22 0632 10/24/22 0059  NA 133* 136 135 134*  K 3.5 3.5 3.5 3.6  CL 101 102 102 102  CO2 22  --  24 25  GLUCOSE 122* 119* 91 98  BUN 14 14 10 15  $ CREATININE 1.21* 1.20* 1.05* 1.00  CALCIUM 8.9  --  8.9 8.7*  MG  --   --  1.9  --    Liver Function Tests: Recent Labs  Lab 10/20/22 2101 10/21/22 0632  AST 29 24  ALT 27 23  ALKPHOS 73 73  BILITOT 0.9 1.2  PROT 5.9* 5.7*  ALBUMIN 3.3* 3.2*   CBG: No results for input(s): "GLUCAP" in the last 168 hours.  Discharge time spent: greater than 30 minutes.  Signed: Estill Cotta, MD [facility] 10/24/2022

## 2022-10-24 NOTE — TOC Progression Note (Signed)
Transition of Care [facility]) - Progression Note    Patient Details  Name: Tiffany Black MRN: [id number] Date of Birth: 12/11/1933  Transition of Care [facility]) CM/SW [place], [place] Phone Number: 10/24/2022, 3:05 PM  Clinical Narrative:     Received authorization for SNF- [id number] Reference # [id number] from 2/13-2/15.  Expected Discharge Plan: Skilled Nursing Facility Barriers to Discharge: Barriers Resolved  Expected Discharge Plan and Services                                               Social Determinants of Health (SDOH) Interventions SDOH Screenings   Food Insecurity: No Food Insecurity (10/22/2022)  Housing: Low Risk  (10/22/2022)  Transportation Needs: No Transportation Needs (10/22/2022)  Utilities: Not At Risk (10/22/2022)  Tobacco Use: Low Risk  (10/23/2022)    Readmission Risk Interventions    08/29/2022   10:19 AM  Readmission Risk Prevention Plan  Transportation Screening Complete  Medication Review (RN Care Manager) Complete  PCP or Specialist appointment within 3-5 days of discharge Complete  HRI or Home Care Consult Complete  SW Recovery Care/Counseling Consult Complete  Palliative Care Screening Not [place] Complete

## 2022-10-24 NOTE — Progress Notes (Signed)
Mobility Specialist Progress Note   10/24/22 1035  Mobility  Activity Ambulated with assistance in room;Ambulated with assistance to bathroom  Level of Assistance Contact guard assist, steadying assist  Assistive Device Front wheel walker  Distance Ambulated (ft) 20 ft  Range of Motion/Exercises Active;All extremities  Activity Response Tolerated well   Patient received dangling EOB, attempting to get OOB without assistance. Requested assistance to restroom. Ambulated in room, to and from restroom with min guard. Required VC for safety. Deferred further mobility or hallway ambulation. Returned to bed without complaint or incident. Was left in supine with all needs met, call bell in reach and bed alarm set.   [place] Chalyn Amescua, BS EXP Mobility Specialist Please contact via SecureChat or Rehab office at [phone]

## 2022-10-24 NOTE — TOC Progression Note (Signed)
Transition of Care [facility]) - Progression Note    Patient Details  Name: Tiffany Black MRN: [id number] Date of Birth: [birth year]  Transition of Care [facility]) CM/SW [place], [place] Phone Number: 10/24/2022, 11:02 AM  Clinical Narrative:     CSW submitted auth request in [employer] portal. Status: pending   Expected Discharge Plan: [place] Barriers to Discharge: Continued Medical Work up, Ship broker  Expected Discharge Plan and Services                                               Social Determinants of Health (SDOH) Interventions SDOH Screenings   Food Insecurity: No Food Insecurity (10/22/2022)  Housing: Low Risk  (10/22/2022)  Transportation Needs: No Transportation Needs (10/22/2022)  Utilities: Not At Risk (10/22/2022)  Tobacco Use: Low Risk  (10/23/2022)    Readmission Risk Interventions    08/29/2022   10:19 AM  Readmission Risk Prevention Plan  Transportation Screening Complete  Medication Review (RN Care Manager) Complete  PCP or Specialist appointment within 3-5 days of discharge Complete  HRI or Home Care Consult Complete  SW Recovery Care/Counseling Consult Complete  Palliative Care Screening Not [place] Complete

## 2022-10-24 NOTE — Progress Notes (Signed)
Physical Therapy Treatment Patient Details Name: Tiffany Black MRN: [id number] DOB: August 28, 1934 Today's Date: 10/24/2022   History of Present Illness Pt is 89 yo female admitted on 10/20/22 after fall with acute anemia and worsening R pleural effusion.  IR to be consulted for possible thoracentesis.  Pt underwent thoracentesis on 2/12. Pt with hx including dementia, afib, anxiety, HTN, recent admission for pleural effusion    PT Comments    Pt with improving mobility but still requires hands on assist for all mobility. Recommend return to SNF for further rehab.    Recommendations for follow up therapy are one component of a multi-disciplinary discharge planning process, led by the attending physician.  Recommendations may be updated based on patient status, additional functional criteria and insurance authorization.  Follow Up Recommendations  Skilled nursing-short term rehab (<3 hours/day) Can patient physically be transported by private vehicle: Yes   Assistance Recommended at Discharge Frequent or constant Supervision/Assistance  Patient can return home with the following A little help with walking and/or transfers;A little help with bathing/dressing/bathroom;Assistance with cooking/housework;Assist for transportation   Equipment Recommendations  None recommended by PT    Recommendations for Other Services       Precautions / Restrictions Precautions Precautions: Fall Restrictions Weight Bearing Restrictions: No     Mobility  Bed Mobility Overal bed mobility: Needs Assistance Bed Mobility: Supine to Sit, Sit to Supine     Supine to sit: Min assist Sit to supine: Supervision   General bed mobility comments: Assist to finish elevating trunk into sitting.    Transfers Overall transfer level: Needs assistance Equipment used: Rolling walker (2 wheels), Rollator (4 wheels) Transfers: Sit to/from Stand Sit to Stand: Min guard           General transfer comment: Assist  for safety. Verbal cues for hand placement.    Ambulation/Gait Ambulation/Gait assistance: Min assist Gait Distance (Feet): 80 Feet Assistive device: Rolling walker (2 wheels), Rollator (4 wheels) Gait Pattern/deviations: Step-through pattern, Decreased step length - right, Decreased step length - left, Shuffle, Trunk flexed Gait velocity: decr Gait velocity interpretation: <1.31 ft/sec, indicative of household ambulator   General Gait Details: Assist for balance. Initially attempted rollator with pt pushing rollator too far ahead and incr anterior lean and instability. Switched to RW wtih improvement but still needing cues to stay closer to walker and stand more erect.   Stairs             Wheelchair Mobility    Modified Rankin (Stroke Patients Only)       Balance Overall balance assessment: Needs assistance Sitting-balance support: No upper extremity supported Sitting balance-Leahy Scale: Good     Standing balance support: Bilateral upper extremity supported, Reliant on assistive device for balance Standing balance-Leahy Scale: Poor Standing balance comment: RW and min guard for static standing                            Cognition Arousal/Alertness: Awake/alert Behavior During Therapy: WFL for tasks assessed/performed Overall Cognitive Status: History of cognitive impairments - at baseline                                          Exercises      General Comments        Pertinent Vitals/Pain Pain Assessment Pain Assessment: No/denies pain    Home Living  Prior Function            PT Goals (current goals can now be found in the care plan section) Acute Rehab PT Goals Patient Stated Goal: not stated Progress towards PT goals: Progressing toward goals    Frequency    Min 2X/week      PT Plan Discharge plan needs to be updated    Co-evaluation              AM-PAC PT "6  Clicks" Mobility   Outcome Measure  Help needed turning from your back to your side while in a flat bed without using bedrails?: A Little Help needed moving from lying on your back to sitting on the side of a flat bed without using bedrails?: A Little Help needed moving to and from a bed to a chair (including a wheelchair)?: A Little Help needed standing up from a chair using your arms (e.g., wheelchair or bedside chair)?: A Little Help needed to walk in hospital room?: A Little Help needed climbing 3-5 steps with a railing? : A Lot 6 Click Score: 17    End of Session Equipment Utilized During Treatment: Gait belt Activity Tolerance: Patient limited by fatigue Patient left: in bed;with call bell/phone within reach;with bed alarm set   PT Visit Diagnosis: Other abnormalities of gait and mobility (R26.89);History of falling (Z91.81)     Time: [id number] PT Time Calculation (min) (ACUTE ONLY): 15 min  Charges:  $Gait Training: 8-22 mins                     [employer] [place] 10/24/2022, 10:28 AM

## 2022-10-26 LAB — CYTOLOGY - NON PAP

## 2022-10-27 LAB — BODY FLUID CULTURE W GRAM STAIN
Culture: NO GROWTH
Gram Stain: NONE SEEN

## 2022-11-01 ENCOUNTER — Encounter: Payer: Self-pay | Admitting: Pulmonary Disease

## 2022-11-01 ENCOUNTER — Ambulatory Visit: Payer: Medicare Other | Admitting: Pulmonary Disease

## 2022-11-01 ENCOUNTER — Ambulatory Visit (INDEPENDENT_AMBULATORY_CARE_PROVIDER_SITE_OTHER): Payer: Medicare Other

## 2022-11-01 VITALS — BP 128/72 | HR 83 | Ht 65.0 in | Wt 139.0 lb

## 2022-11-01 DIAGNOSIS — J90 Pleural effusion, not elsewhere classified: Secondary | ICD-10-CM

## 2022-11-01 NOTE — Patient Instructions (Addendum)
Your pleural effusion has returned.   We will schedule you for thoracentesis at [facility] on Friday 2/23 in the afternoon with Dr. Tamala Julian or Dr. Verlee Monte.   We will call you with more details once that is scheduled.  Please stop taking your Xarelto until we let you know when to resume the medication after the procedure.   Follow up on 2/28 to review the procedure results.

## 2022-11-01 NOTE — Progress Notes (Signed)
Synopsis: Referred in February 2024 for Pleural Effusion by Erenest Rasher, MD  Subjective:   PATIENT ID: Tiffany Black GENDER: female DOB: March 07, 1934, MRN: [id number]  HPI  Chief Complaint  Patient presents with   Consult    Referred for history of pleural effusion. States she has been doing well recently.    Tiffany Black is an 89 year old woman, never smoker with history of dementia, endometrial cancer, hypertension, atrial fibrillation and CVA who is referred to pulmonary clinic for recurrent right pleural effusion.  She had a fall in her front yard at the end of November on to her right side, chest x-ray 11/29 showed no acute issues. She was admitted 12/17 to 12/20 for covid 19 infection. CXR 12/16 showed bibasilar infiltrates. She was discharged to rehab facility and returned to the hospital 1/16  for dyspnea and fatigue and admitted through 1/23. She was noted to have right pleural effusion. She had IR thoracentesis 1/18 which showed exudative effusion based on total protein and lymphocyte predominate effusion. She returned to the ER 2/9 after a fall at her facility. Chest x-ray showed moderate to large right effusion. She had repeat thora on 2/12 which was again lymphocyte predominant. Pleural fluid cytology is negative for malignancy on both samples.   She has been doing ok since discharge. No chest pain or shortness of breath per the son. No cough, fevers or chills.   Her hemoglobin is 10.2 to 10.7g/dL in February from 12.7g/dL in January.   Pleural Fluid studies 09/28/22 LDH 133, Total Protein 3.1, Total cell count 412 with 64% lymph, 20% neutrophil  Pleural Fluid Studies 10/23/22 Total cell count 726, 98% lymph   Past Medical History:  Diagnosis Date   Anxiety    Arthritis    Bulging of cervical intervertebral disc    Cardiomegaly    Cataracts, bilateral    Chronic neck pain    Dementia (HCC)    Essential hypertension    History of endometrial cancer    History of  stroke    2015 and 2016 // carotid US 8/16: Bilateral 1-39 // carotid US 2/16: Bilateral ICA 1-39   HLD (hyperlipidemia)    Hx of echocardiogram    a. Echo (11/15): Mild LVH, EF 55-60%, normal wall motion, grade 2 diastolic dysfunction, mild LAE, normal RV function, PASP 29 mm Hg // echo 8/16:Mild concentric LVH, EF 55-60, normal wall motion, grade 2 diastolic dysfunction, mild LAE, PASP 39   Hypertension    Insomnia    Lung nodule    OA (osteoarthritis)    Pectus excavatum    Persistent atrial fibrillation (HCC)    Vertigo      Family History  Problem Relation Age of Onset   Dementia Mother    Heart attack Father    Heart disease Father    Stroke Maternal Uncle    Hypertension Neg Hx      Social History   Socioeconomic History   Marital status: Widowed    Spouse name: Not on file   Number of children: 3   Years of education: 29   Highest education level: Not on file  Occupational History   Occupation: RetiredNurse, learning disability  Tobacco Use   Smoking status: Never   Smokeless tobacco: Never  Vaping Use   Vaping Use: Never used  Substance and Sexual Activity   Alcohol use: No   Drug use: No   Sexual activity: Never  Other Topics Concern   Not on file  Social History Narrative   Diet: N/A   Caffeine: N/A   Married, if yes what year: Widow, married 1956   Do you live in a house, apartment, assisted living, condo, trailer, ect: House, one stories, 2 persons   Pets: None   Highest level of education: 12th Grade    Current/Past profession: Network engineer    Exercise: Yes, walking everyday    Right handed   Living Will:    DNR:   POA/HPOA:   Functional Status:   Do you have difficulty bathing or dressing yourself? No   Do you have difficulty preparing food or eating? No    Do you have difficulty managing your medications? No   Do you have difficulty managing your finances? No    Do you have difficulty affording your medications? Yes   Social Determinants of Health    Financial Resource Strain: Not on file  Food Insecurity: No Food Insecurity (10/22/2022)   Hunger Vital Sign    Worried About Running Out of Food in the Last Year: Never true    Ran Out of Food in the Last Year: Never true  Transportation Needs: No Transportation Needs (10/22/2022)   PRAPARE - Hydrologist (Medical): No    Lack of Transportation (Non-Medical): No  Physical Activity: Not on file  Stress: Not on file  Social Connections: Not on file  Intimate Partner Violence: Not At Risk (10/22/2022)   Humiliation, Afraid, Rape, and Kick questionnaire    Fear of Current or Ex-Partner: No    Emotionally Abused: No    Physically Abused: No    Sexually Abused: No     Allergies  Allergen Reactions   Hydrochlorothiazide Hives   Penicillins Hives    Has patient had a PCN reaction causing immediate rash, facial/tongue/throat swelling, SOB or lightheadedness with hypotension: Yes Has patient had a PCN reaction causing severe rash involving mucus membranes or skin necrosis: No Has patient had a PCN reaction that required hospitalization No Has patient had a PCN reaction occurring within the last 10 years: No If all of the above answers are "NO", then may proceed with Cephalosporin use.    Ciprofloxacin Other (See Comments) and Hypertension    Blood pressure issues- patient doesn't recall if it increased OR decreased it     Doxycycline Diarrhea and Nausea And Vomiting   Hydrocodone-Acetaminophen Other (See Comments)    Unknown reaction   Iodinated Contrast Media Diarrhea, Other (See Comments) and Hypertension    Elevated the B/P   Iodine-131 Other (See Comments) and Hypertension    Raised the blood pressure   Lasix [Furosemide] Other (See Comments)    No reaction listed on order summary report from [facility]   Latex Itching   Statins Other (See Comments)    Muscle weakness   Carbamazepine Other (See Comments) and Rash    Flushed blood pressure medication out  of system   Codeine Nausea And Vomiting   Nickel Rash   Sulfa Antibiotics Hives and Rash   Sulfamethoxazole Hives and Rash   Sulfonamide Derivatives Hives and Rash          Outpatient Medications Prior to Visit  Medication Sig Dispense Refill   acetaminophen (TYLENOL) 500 MG tablet Take 2 tablets (1,000 mg total) by mouth every 8 (eight) hours for 5 days, THEN 2 tablets (1,000 mg total) every 8 (eight) hours as needed for up to 25 days.     diclofenac Sodium (VOLTAREN) 1 % GEL Apply  2 g topically 4 (four) times daily. To right wrist (Patient taking differently: Apply 2 g topically every 6 (six) hours as needed (wrist pain).)     felodipine (PLENDIL) 10 MG 24 hr tablet Take 1 tablet (10 mg total) by mouth daily.     metoprolol tartrate (LOPRESSOR) 50 MG tablet Take 1.5 tablets (75 mg total) by mouth 2 (two) times daily.     QUEtiapine (SEROQUEL) 50 MG tablet Take 1 tablet (50 mg total) by mouth at bedtime.     Rivaroxaban (XARELTO) 15 MG TABS tablet Take 1 tablet (15 mg total) by mouth daily with supper. 30 tablet 5   senna-docusate (SENOKOT-S) 8.6-50 MG tablet Take 1 tablet by mouth at bedtime as needed for mild constipation.     valsartan (DIOVAN) 320 MG tablet Take 1 tablet (320 mg total) by mouth daily.     No facility-administered medications prior to visit.   Review of Systems  Constitutional:  Negative for chills, fever, malaise/fatigue and weight loss.  HENT:  Negative for congestion, sinus pain and sore throat.   Eyes: Negative.   Respiratory:  Positive for shortness of breath. Negative for cough, hemoptysis, sputum production and wheezing.   Cardiovascular:  Negative for chest pain, palpitations, orthopnea, claudication and leg swelling.  Gastrointestinal:  Negative for abdominal pain, heartburn, nausea and vomiting.  Genitourinary: Negative.   Musculoskeletal:  Negative for joint pain and myalgias.  Skin:  Negative for rash.  Neurological:  Negative for weakness.   Endo/Heme/Allergies: Negative.   Psychiatric/Behavioral:  Positive for depression. The patient is nervous/anxious.    Objective:   Vitals:   11/01/22 1407  BP: 128/72  Pulse: 83  SpO2: 96%  Weight: 139 lb (63 kg)  Height: 5' 5"$  (1.651 m)   Physical Exam Constitutional:      General: She is not in acute distress.    Appearance: She is not ill-appearing.  HENT:     Head: Normocephalic and atraumatic.  Eyes:     General: No scleral icterus.    Conjunctiva/sclera: Conjunctivae normal.     Pupils: Pupils are equal, round, and reactive to light.  Cardiovascular:     Rate and Rhythm: Normal rate and regular rhythm.     Pulses: Normal pulses.     Heart sounds: Normal heart sounds. No murmur heard. Pulmonary:     Effort: Pulmonary effort is normal.     Breath sounds: Decreased air movement present. Examination of the right-middle field reveals decreased breath sounds. Examination of the right-lower field reveals decreased breath sounds. Decreased breath sounds present. No wheezing, rhonchi or rales.  Abdominal:     General: Bowel sounds are normal.     Palpations: Abdomen is soft.  Musculoskeletal:     Right lower leg: No edema.     Left lower leg: No edema.  Lymphadenopathy:     Cervical: No cervical adenopathy.  Skin:    General: Skin is warm and dry.  Neurological:     General: No focal deficit present.     Mental Status: She is alert.  Psychiatric:        Mood and Affect: Mood normal.        Behavior: Behavior normal.        Thought Content: Thought content normal.        Judgment: Judgment normal.    CBC    Component Value Date/Time   WBC 6.3 10/24/2022 0059   RBC 3.16 (L) 10/24/2022 0059   HGB 10.2 (L)  10/24/2022 0059   HGB 12.5 06/13/2022 1145   HCT 30.1 (L) 10/24/2022 0059   HCT 38.0 06/13/2022 1145   PLT 175 10/24/2022 0059   PLT 169 06/13/2022 1145   MCV 95.3 10/24/2022 0059   MCV 92 06/13/2022 1145   MCH 32.3 10/24/2022 0059   MCHC 33.9 10/24/2022  0059   RDW 14.4 10/24/2022 0059   RDW 13.1 06/13/2022 1145   LYMPHSABS 1.1 10/21/2022 0632   LYMPHSABS 1.7 11/28/2017 1135   MONOABS 0.6 10/21/2022 0632   EOSABS 0.4 10/21/2022 0632   EOSABS 0.2 11/28/2017 1135   BASOSABS 0.1 10/21/2022 0632   BASOSABS 0.0 11/28/2017 1135      Latest Ref Rng & Units 10/24/2022   12:59 AM 10/21/2022    6:32 AM 10/20/2022    9:29 PM  BMP  Glucose 70 - 99 mg/dL 98  91  119   BUN 8 - 23 mg/dL 15  10  14   $ Creatinine 0.44 - 1.00 mg/dL 1.00  1.05  1.20   Sodium 135 - 145 mmol/L 134  135  136   Potassium 3.5 - 5.1 mmol/L 3.6  3.5  3.5   Chloride 98 - 111 mmol/L 102  102  102   CO2 22 - 32 mmol/L 25  24    Calcium 8.9 - 10.3 mg/dL 8.7  8.9     Chest imaging: CXR 10/23/22 Decreasing right pleural effusion after thoracentesis with some residual fluid. No pneumothorax or edema. Enlarged cardiopericardial silhouette. Calcified aorta. Overlapping cardiac leads.  CXR 10/20/22 1. Moderate-to-large right pleural effusion, appears increased from the recent prior study. Underlying pneumonic infiltrate could be hidden. 2. Cardiomegaly and aortic atherosclerosis. 3. Osteopenia and degenerative change without evidence of fractures of the AP pelvis, proximal femurs, right tibia fibula and right foot. 4. Soft tissue edema throughout the right foreleg and foot. 5. Pelvic and calcaneal enthesopathic change. 6. Hallux valgus. Mild degenerative changes of the midfoot and forefoot.  PFT:     No data to display          Labs:  Path:  Echo 07/31/22: LV EF 60-65%. Mild LVH. RV size and systolic function is normal. LA and RA normal size.   Heart Catheterization:  Assessment & Plan:   Recurrent right pleural effusion - Plan: DG Chest 2 View, Ambulatory referral to Pulmonology  Discussion: Tiffany Black is an 89 year old woman, never smoker with history of dementia, endometrial cancer, hypertension, atrial fibrillation and CVA who is referred to pulmonary  clinic for recurrent right pleural effusion.  She has recurrent pleural effusion that is exudative and lymphocyte predominant concerning for possible malignancy. Other differential include trauma induced hemothorax in setting of blood thinner vs para-pneumonic effusion.   She is to hold her Xarelto until the procedure is completed.   We will send pleural fluid for cytology, cell count and differential, cultures, LDH and total protein.   She is to follow up 2/28 to review the results of the procedure. If effusion returns, will plan for admission to have chest tube placed for complete drainage of effusion with follow up Chest CT.   65 minutes were spent on this visit including record and imaging review, direct patient care, completion of documentation and orders.   Freda Bunn, MD [facility] Office: [phone]   Current Outpatient Medications:    acetaminophen (TYLENOL) 500 MG tablet, Take 2 tablets (1,000 mg total) by mouth every 8 (eight) hours for 5 days, THEN 2  tablets (1,000 mg total) every 8 (eight) hours as needed for up to 25 days., Disp: , Rfl:    diclofenac Sodium (VOLTAREN) 1 % GEL, Apply 2 g topically 4 (four) times daily. To right wrist (Patient taking differently: Apply 2 g topically every 6 (six) hours as needed (wrist pain).), Disp: , Rfl:    felodipine (PLENDIL) 10 MG 24 hr tablet, Take 1 tablet (10 mg total) by mouth daily., Disp: , Rfl:    metoprolol tartrate (LOPRESSOR) 50 MG tablet, Take 1.5 tablets (75 mg total) by mouth 2 (two) times daily., Disp: , Rfl:    QUEtiapine (SEROQUEL) 50 MG tablet, Take 1 tablet (50 mg total) by mouth at bedtime., Disp: , Rfl:    Rivaroxaban (XARELTO) 15 MG TABS tablet, Take 1 tablet (15 mg total) by mouth daily with supper., Disp: 30 tablet, Rfl: 5   senna-docusate (SENOKOT-S) 8.6-50 MG tablet, Take 1 tablet by mouth at bedtime as needed for mild constipation., Disp: , Rfl:    valsartan (DIOVAN) 320 MG tablet,  Take 1 tablet (320 mg total) by mouth daily., Disp: , Rfl:

## 2022-11-01 NOTE — Progress Notes (Signed)
      I attempted to see patient today; she was out of the room for thoracentesis. I also called son again with no answer; voicemail left requesting a call back.      Elie Confer, NP-C Palliative Medicine   Please call Palliative Medicine team phone with any questions [phone]. For individual providers please see AMION.   No charge

## 2022-11-01 NOTE — H&P (View-Only) (Signed)
Synopsis: Referred in February 2024 for Pleural Effusion by Erenest Rasher, MD  Subjective:   PATIENT ID: Tiffany Black GENDER: female DOB: 06-18-1934, MRN: [id number]  HPI  Chief Complaint  Patient presents with   Consult    Referred for history of pleural effusion. States she has been doing well recently.    Tiffany Black is an 89 year old woman, never smoker with history of dementia, endometrial cancer, hypertension, atrial fibrillation and CVA who is referred to pulmonary clinic for recurrent right pleural effusion.  She had a fall in her front yard at the end of November on to her right side, chest x-ray 11/29 showed no acute issues. She was admitted 12/17 to 12/20 for covid 19 infection. CXR 12/16 showed bibasilar infiltrates. She was discharged to rehab facility and returned to the hospital 1/16  for dyspnea and fatigue and admitted through 1/23. She was noted to have right pleural effusion. She had IR thoracentesis 1/18 which showed exudative effusion based on total protein and lymphocyte predominate effusion. She returned to the ER 2/9 after a fall at her facility. Chest x-ray showed moderate to large right effusion. She had repeat thora on 2/12 which was again lymphocyte predominant. Pleural fluid cytology is negative for malignancy on both samples.   She has been doing ok since discharge. No chest pain or shortness of breath per the son. No cough, fevers or chills.   Her hemoglobin is 10.2 to 10.7g/dL in February from 12.7g/dL in January.   Pleural Fluid studies 09/28/22 LDH 133, Total Protein 3.1, Total cell count 412 with 64% lymph, 20% neutrophil  Pleural Fluid Studies 10/23/22 Total cell count 726, 98% lymph   Past Medical History:  Diagnosis Date   Anxiety    Arthritis    Bulging of cervical intervertebral disc    Cardiomegaly    Cataracts, bilateral    Chronic neck pain    Dementia (HCC)    Essential hypertension    History of endometrial cancer    History of  stroke    2015 and 2016 // carotid US 8/16: Bilateral 1-39 // carotid US 2/16: Bilateral ICA 1-39   HLD (hyperlipidemia)    Hx of echocardiogram    a. Echo (11/15): Mild LVH, EF 55-60%, normal wall motion, grade 2 diastolic dysfunction, mild LAE, normal RV function, PASP 29 mm Hg // echo 8/16:Mild concentric LVH, EF 55-60, normal wall motion, grade 2 diastolic dysfunction, mild LAE, PASP 39   Hypertension    Insomnia    Lung nodule    OA (osteoarthritis)    Pectus excavatum    Persistent atrial fibrillation (HCC)    Vertigo      Family History  Problem Relation Age of Onset   Dementia Mother    Heart attack Father    Heart disease Father    Stroke Maternal Uncle    Hypertension Neg Hx      Social History   Socioeconomic History   Marital status: Widowed    Spouse name: Not on file   Number of children: 3   Years of education: 31   Highest education level: Not on file  Occupational History   Occupation: RetiredNurse, learning disability  Tobacco Use   Smoking status: Never   Smokeless tobacco: Never  Vaping Use   Vaping Use: Never used  Substance and Sexual Activity   Alcohol use: No   Drug use: No   Sexual activity: Never  Other Topics Concern   Not on file  Social History Narrative   Diet: N/A   Caffeine: N/A   Married, if yes what year: Widow, married 1956   Do you live in a house, apartment, assisted living, condo, trailer, ect: House, one stories, 2 persons   Pets: None   Highest level of education: 12th Grade    Current/Past profession: Network engineer    Exercise: Yes, walking everyday    Right handed   Living Will:    DNR:   POA/HPOA:   Functional Status:   Do you have difficulty bathing or dressing yourself? No   Do you have difficulty preparing food or eating? No    Do you have difficulty managing your medications? No   Do you have difficulty managing your finances? No    Do you have difficulty affording your medications? Yes   Social Determinants of Health    Financial Resource Strain: Not on file  Food Insecurity: No Food Insecurity (10/22/2022)   Hunger Vital Sign    Worried About Running Out of Food in the Last Year: Never true    Ran Out of Food in the Last Year: Never true  Transportation Needs: No Transportation Needs (10/22/2022)   PRAPARE - Hydrologist (Medical): No    Lack of Transportation (Non-Medical): No  Physical Activity: Not on file  Stress: Not on file  Social Connections: Not on file  Intimate Partner Violence: Not At Risk (10/22/2022)   Humiliation, Afraid, Rape, and Kick questionnaire    Fear of Current or Ex-Partner: No    Emotionally Abused: No    Physically Abused: No    Sexually Abused: No     Allergies  Allergen Reactions   Hydrochlorothiazide Hives   Penicillins Hives    Has patient had a PCN reaction causing immediate rash, facial/tongue/throat swelling, SOB or lightheadedness with hypotension: Yes Has patient had a PCN reaction causing severe rash involving mucus membranes or skin necrosis: No Has patient had a PCN reaction that required hospitalization No Has patient had a PCN reaction occurring within the last 10 years: No If all of the above answers are "NO", then may proceed with Cephalosporin use.    Ciprofloxacin Other (See Comments) and Hypertension    Blood pressure issues- patient doesn't recall if it increased OR decreased it     Doxycycline Diarrhea and Nausea And Vomiting   Hydrocodone-Acetaminophen Other (See Comments)    Unknown reaction   Iodinated Contrast Media Diarrhea, Other (See Comments) and Hypertension    Elevated the B/P   Iodine-131 Other (See Comments) and Hypertension    Raised the blood pressure   Lasix [Furosemide] Other (See Comments)    No reaction listed on order summary report from [facility]   Latex Itching   Statins Other (See Comments)    Muscle weakness   Carbamazepine Other (See Comments) and Rash    Flushed blood pressure medication out  of system   Codeine Nausea And Vomiting   Nickel Rash   Sulfa Antibiotics Hives and Rash   Sulfamethoxazole Hives and Rash   Sulfonamide Derivatives Hives and Rash          Outpatient Medications Prior to Visit  Medication Sig Dispense Refill   acetaminophen (TYLENOL) 500 MG tablet Take 2 tablets (1,000 mg total) by mouth every 8 (eight) hours for 5 days, THEN 2 tablets (1,000 mg total) every 8 (eight) hours as needed for up to 25 days.     diclofenac Sodium (VOLTAREN) 1 % GEL Apply  2 g topically 4 (four) times daily. To right wrist (Patient taking differently: Apply 2 g topically every 6 (six) hours as needed (wrist pain).)     felodipine (PLENDIL) 10 MG 24 hr tablet Take 1 tablet (10 mg total) by mouth daily.     metoprolol tartrate (LOPRESSOR) 50 MG tablet Take 1.5 tablets (75 mg total) by mouth 2 (two) times daily.     QUEtiapine (SEROQUEL) 50 MG tablet Take 1 tablet (50 mg total) by mouth at bedtime.     Rivaroxaban (XARELTO) 15 MG TABS tablet Take 1 tablet (15 mg total) by mouth daily with supper. 30 tablet 5   senna-docusate (SENOKOT-S) 8.6-50 MG tablet Take 1 tablet by mouth at bedtime as needed for mild constipation.     valsartan (DIOVAN) 320 MG tablet Take 1 tablet (320 mg total) by mouth daily.     No facility-administered medications prior to visit.   Review of Systems  Constitutional:  Negative for chills, fever, malaise/fatigue and weight loss.  HENT:  Negative for congestion, sinus pain and sore throat.   Eyes: Negative.   Respiratory:  Positive for shortness of breath. Negative for cough, hemoptysis, sputum production and wheezing.   Cardiovascular:  Negative for chest pain, palpitations, orthopnea, claudication and leg swelling.  Gastrointestinal:  Negative for abdominal pain, heartburn, nausea and vomiting.  Genitourinary: Negative.   Musculoskeletal:  Negative for joint pain and myalgias.  Skin:  Negative for rash.  Neurological:  Negative for weakness.   Endo/Heme/Allergies: Negative.   Psychiatric/Behavioral:  Positive for depression. The patient is nervous/anxious.    Objective:   Vitals:   11/01/22 1407  BP: 128/72  Pulse: 83  SpO2: 96%  Weight: 139 lb (63 kg)  Height: '5\' 5"'$  (1.651 m)   Physical Exam Constitutional:      General: She is not in acute distress.    Appearance: She is not ill-appearing.  HENT:     Head: Normocephalic and atraumatic.  Eyes:     General: No scleral icterus.    Conjunctiva/sclera: Conjunctivae normal.     Pupils: Pupils are equal, round, and reactive to light.  Cardiovascular:     Rate and Rhythm: Normal rate and regular rhythm.     Pulses: Normal pulses.     Heart sounds: Normal heart sounds. No murmur heard. Pulmonary:     Effort: Pulmonary effort is normal.     Breath sounds: Decreased air movement present. Examination of the right-middle field reveals decreased breath sounds. Examination of the right-lower field reveals decreased breath sounds. Decreased breath sounds present. No wheezing, rhonchi or rales.  Abdominal:     General: Bowel sounds are normal.     Palpations: Abdomen is soft.  Musculoskeletal:     Right lower leg: No edema.     Left lower leg: No edema.  Lymphadenopathy:     Cervical: No cervical adenopathy.  Skin:    General: Skin is warm and dry.  Neurological:     General: No focal deficit present.     Mental Status: She is alert.  Psychiatric:        Mood and Affect: Mood normal.        Behavior: Behavior normal.        Thought Content: Thought content normal.        Judgment: Judgment normal.    CBC    Component Value Date/Time   WBC 6.3 10/24/2022 0059   RBC 3.16 (L) 10/24/2022 0059   HGB 10.2 (L)  10/24/2022 0059   HGB 12.5 06/13/2022 1145   HCT 30.1 (L) 10/24/2022 0059   HCT 38.0 06/13/2022 1145   PLT 175 10/24/2022 0059   PLT 169 06/13/2022 1145   MCV 95.3 10/24/2022 0059   MCV 92 06/13/2022 1145   MCH 32.3 10/24/2022 0059   MCHC 33.9 10/24/2022  0059   RDW 14.4 10/24/2022 0059   RDW 13.1 06/13/2022 1145   LYMPHSABS 1.1 10/21/2022 0632   LYMPHSABS 1.7 11/28/2017 1135   MONOABS 0.6 10/21/2022 0632   EOSABS 0.4 10/21/2022 0632   EOSABS 0.2 11/28/2017 1135   BASOSABS 0.1 10/21/2022 0632   BASOSABS 0.0 11/28/2017 1135      Latest Ref Rng & Units 10/24/2022   12:59 AM 10/21/2022    6:32 AM 10/20/2022    9:29 PM  BMP  Glucose 70 - 99 mg/dL 98  91  119   BUN 8 - 23 mg/dL '15  10  14   '$ Creatinine 0.44 - 1.00 mg/dL 1.00  1.05  1.20   Sodium 135 - 145 mmol/L 134  135  136   Potassium 3.5 - 5.1 mmol/L 3.6  3.5  3.5   Chloride 98 - 111 mmol/L 102  102  102   CO2 22 - 32 mmol/L 25  24    Calcium 8.9 - 10.3 mg/dL 8.7  8.9     Chest imaging: CXR 10/23/22 Decreasing right pleural effusion after thoracentesis with some residual fluid. No pneumothorax or edema. Enlarged cardiopericardial silhouette. Calcified aorta. Overlapping cardiac leads.  CXR 10/20/22 1. Moderate-to-large right pleural effusion, appears increased from the recent prior study. Underlying pneumonic infiltrate could be hidden. 2. Cardiomegaly and aortic atherosclerosis. 3. Osteopenia and degenerative change without evidence of fractures of the AP pelvis, proximal femurs, right tibia fibula and right foot. 4. Soft tissue edema throughout the right foreleg and foot. 5. Pelvic and calcaneal enthesopathic change. 6. Hallux valgus. Mild degenerative changes of the midfoot and forefoot.  PFT:     No data to display          Labs:  Path:  Echo 07/31/22: LV EF 60-65%. Mild LVH. RV size and systolic function is normal. LA and RA normal size.   Heart Catheterization:  Assessment & Plan:   Recurrent right pleural effusion - Plan: DG Chest 2 View, Ambulatory referral to Pulmonology  Discussion: Tiffany Black is an 89 year old woman, never smoker with history of dementia, endometrial cancer, hypertension, atrial fibrillation and CVA who is referred to pulmonary  clinic for recurrent right pleural effusion.  She has recurrent pleural effusion that is exudative and lymphocyte predominant concerning for possible malignancy. Other differential include trauma induced hemothorax in setting of blood thinner vs para-pneumonic effusion.   She is to hold her Xarelto until the procedure is completed.   We will send pleural fluid for cytology, cell count and differential, cultures, LDH and total protein.   She is to follow up 2/28 to review the results of the procedure. If effusion returns, will plan for admission to have chest tube placed for complete drainage of effusion with follow up Chest CT.   65 minutes were spent on this visit including record and imaging review, direct patient care, completion of documentation and orders.   Freda Ekman, MD [place] [facility] Office: [phone]   Current Outpatient Medications:    acetaminophen (TYLENOL) 500 MG tablet, Take 2 tablets (1,000 mg total) by mouth every 8 (eight) hours for 5 days, THEN 2  tablets (1,000 mg total) every 8 (eight) hours as needed for up to 25 days., Disp: , Rfl:    diclofenac Sodium (VOLTAREN) 1 % GEL, Apply 2 g topically 4 (four) times daily. To right wrist (Patient taking differently: Apply 2 g topically every 6 (six) hours as needed (wrist pain).), Disp: , Rfl:    felodipine (PLENDIL) 10 MG 24 hr tablet, Take 1 tablet (10 mg total) by mouth daily., Disp: , Rfl:    metoprolol tartrate (LOPRESSOR) 50 MG tablet, Take 1.5 tablets (75 mg total) by mouth 2 (two) times daily., Disp: , Rfl:    QUEtiapine (SEROQUEL) 50 MG tablet, Take 1 tablet (50 mg total) by mouth at bedtime., Disp: , Rfl:    Rivaroxaban (XARELTO) 15 MG TABS tablet, Take 1 tablet (15 mg total) by mouth daily with supper., Disp: 30 tablet, Rfl: 5   senna-docusate (SENOKOT-S) 8.6-50 MG tablet, Take 1 tablet by mouth at bedtime as needed for mild constipation., Disp: , Rfl:    valsartan (DIOVAN) 320 MG tablet,  Take 1 tablet (320 mg total) by mouth daily., Disp: , Rfl:

## 2022-11-02 ENCOUNTER — Telehealth: Payer: Self-pay | Admitting: Pulmonary Disease

## 2022-11-02 NOTE — Telephone Encounter (Signed)
Please call patient's son to let them know she has thoracentesis at 2:30pm at [facility] in the endoscopy suite on 2/23. She is to check in at 2pm. She needs to stop taking her Xarelto.   I left a VM on his phone yesterday. But need to confirm so they can arrange transportation with her rehab facility.  Thanks, Wille Glaser

## 2022-11-02 NOTE — Telephone Encounter (Signed)
Spoke with Tiffany Black (per [facility]) and reviewed information provided by Dr. Erin Fulling. Tiffany Black has already contacted rehab facility pt is currently at and confirmed he will [facility] at [facility] tomorrow. Nothing further needed at this time.

## 2022-11-03 ENCOUNTER — Encounter (HOSPITAL_COMMUNITY)
Admission: RE | Disposition: A | Discharge status: Home / Self Care | Payer: Self-pay | Source: Ambulatory Visit | Attending: Student

## 2022-11-03 ENCOUNTER — Ambulatory Visit (HOSPITAL_COMMUNITY)
Admission: RE | Admit: 2022-11-03 | Discharge: 2022-11-03 | Disposition: A | Discharge status: Home / Self Care | Payer: Medicare Other | Source: Ambulatory Visit | Attending: Student | Admitting: Student

## 2022-11-03 ENCOUNTER — Ambulatory Visit (HOSPITAL_COMMUNITY): Payer: Medicare Other

## 2022-11-03 ENCOUNTER — Encounter (HOSPITAL_COMMUNITY): Payer: Self-pay | Admitting: Student

## 2022-11-03 DIAGNOSIS — E785 Hyperlipidemia, unspecified: Secondary | ICD-10-CM | POA: Insufficient documentation

## 2022-11-03 DIAGNOSIS — J90 Pleural effusion, not elsewhere classified: Secondary | ICD-10-CM | POA: Diagnosis present

## 2022-11-03 HISTORY — PX: THORACENTESIS: SHX235

## 2022-11-03 LAB — BODY FLUID CELL COUNT WITH DIFFERENTIAL
Eos, Fluid: 1 %
Lymphs, Fluid: 91 %
Monocyte-Macrophage-Serous Fluid: 8 % — ABNORMAL LOW (ref 50–90)
Neutrophil Count, Fluid: 0 % (ref 0–25)
Total Nucleated Cell Count, Fluid: 648 cu mm (ref 0–1000)

## 2022-11-03 LAB — MISC LABCORP TEST (SEND OUT): Labcorp test code: 9985

## 2022-11-03 LAB — GLUCOSE, PLEURAL OR PERITONEAL FLUID: Glucose, Fluid: 134 mg/dL

## 2022-11-03 LAB — PROTEIN, PLEURAL OR PERITONEAL FLUID: Total protein, fluid: <3 g/dL

## 2022-11-03 LAB — LACTATE DEHYDROGENASE, PLEURAL OR PERITONEAL FLUID: LD, Fluid: 82 U/L — ABNORMAL HIGH (ref 3–23)

## 2022-11-03 SURGERY — THORACENTESIS
Laterality: Right

## 2022-11-03 NOTE — Op Note (Addendum)
Thoracentesis  Procedure Note  Kaliee Robards  [id number]  1933-11-07  Date:11/03/22  Time:3:35 PM   Provider Performing:Shilynn Hoch Jerilynn Mages Verlee Monte   Procedure: Thoracentesis with imaging guidance [id number])  Indication(s) Pleural Effusion  Consent Risks of the procedure as well as the alternatives and risks of each were explained to the patient and/or caregiver.  Consent for the procedure was obtained and is signed in the bedside chart  Anesthesia Topical only with 1% lidocaine    Time Out Verified patient identification, verified procedure, site/side was marked, verified correct patient position, special equipment/implants available, medications/allergies/relevant history reviewed, required imaging and test results available.   Sterile Technique Maximal sterile technique including full sterile barrier drape, hand hygiene, sterile gown, sterile gloves, mask, hair covering, sterile ultrasound probe cover (if used).  Procedure Description Ultrasound was used to identify appropriate pleural anatomy for placement and overlying skin marked.  Area of drainage cleaned and draped in sterile fashion. Lidocaine was used to anesthetize the skin and subcutaneous tissue.  1200 cc's of amber appearing fluid was drained from the right pleural space. Catheter then removed and bandaid applied to site.      Complications/Tolerance None; patient tolerated the procedure well. Chest X-ray is ordered to confirm no post-procedural complication.   EBL Minimal   Specimen(s) Pleural fluid sent for usual studies

## 2022-11-03 NOTE — Interval H&P Note (Signed)
History and Physical Interval Note:  11/03/2022 3:34 PM  Tiffany Black  has presented today for surgery, with the diagnosis of RECURRENT RIGHT PLEURAL EFFUSION.  The various methods of treatment have been discussed with the patient and family. After consideration of risks, benefits and other options for treatment, the patient has consented to  Procedure(s): THORACENTESIS (Right) as a surgical intervention.  The patient's history has been reviewed, patient examined, no change in status, stable for surgery.  I have reviewed the patient's chart and labs.  Questions were answered to the patient's satisfaction.     [name]

## 2022-11-03 NOTE — Discharge Instructions (Addendum)
Wait to resume xarelto until you are seen in Dr. August Albino clinic on 2/28

## 2022-11-06 ENCOUNTER — Encounter (HOSPITAL_COMMUNITY): Payer: Self-pay | Admitting: Student

## 2022-11-06 LAB — BODY FLUID CULTURE W GRAM STAIN: Culture: NO GROWTH

## 2022-11-06 LAB — TRIGLYCERIDES, BODY FLUIDS: Triglycerides, Fluid: 21 mg/dL

## 2022-11-07 LAB — CYTOLOGY - NON PAP

## 2022-11-08 ENCOUNTER — Ambulatory Visit: Payer: Medicare Other | Admitting: Pulmonary Disease

## 2022-11-08 ENCOUNTER — Telehealth: Payer: Self-pay | Admitting: Pulmonary Disease

## 2022-11-08 DIAGNOSIS — J90 Pleural effusion, not elsewhere classified: Secondary | ICD-10-CM

## 2022-11-10 ENCOUNTER — Telehealth: Payer: Self-pay | Admitting: Pulmonary Disease

## 2022-11-10 NOTE — Telephone Encounter (Signed)
I spoke with patient's son regarding the pleural fluid results.   Still appears transudative with lymphocyte predominance. Cytology is again negative.   Will plan to repeat a chest x-ray next Wednesday or Thursday. If effusion returns, will likely need a chest tube placed and repeat CT chest scan once effusion is completely drained.   She is to remain off eliquis at this time.  Please setup a time for patient to come in Wednesday or Thursday next week and notify son of the date/time.   Thanks,  Freda Crisanti, MD [place] [facility] Office: [phone]   See Amion for personal pager PCCM on call pager [phone] until 7pm. Please call Elink 7p-7a. [phone]

## 2022-11-10 NOTE — Telephone Encounter (Signed)
See encounter from today.

## 2022-11-10 NOTE — Telephone Encounter (Signed)
Patient's son would like the nurse to call regarding a procedure his mother had.  She missed her appt. On 2/28 and he would like to know the next steps.  CB# [phone]

## 2022-11-13 NOTE — Telephone Encounter (Signed)
Pt son Returning [place] call

## 2022-11-13 NOTE — Telephone Encounter (Signed)
Called patient's son but he did not answer. Left message for him to call back.

## 2022-11-13 NOTE — Telephone Encounter (Signed)
Spoke with patients son he advises he will bring her in Tuesday afternoon for patient to complete chest xray. Order has been already placed. So nothing further needed.

## 2022-11-15 ENCOUNTER — Ambulatory Visit (INDEPENDENT_AMBULATORY_CARE_PROVIDER_SITE_OTHER): Payer: Medicare Other

## 2022-11-15 DIAGNOSIS — J90 Pleural effusion, not elsewhere classified: Secondary | ICD-10-CM | POA: Diagnosis not present

## 2022-11-18 ENCOUNTER — Encounter (HOSPITAL_COMMUNITY): Payer: Self-pay

## 2022-11-18 ENCOUNTER — Other Ambulatory Visit: Payer: Self-pay

## 2022-11-18 ENCOUNTER — Emergency Department (HOSPITAL_COMMUNITY): Payer: Medicare Other

## 2022-11-18 ENCOUNTER — Emergency Department (HOSPITAL_COMMUNITY)
Admission: EM | Admit: 2022-11-18 | Discharge: 2022-11-19 | Disposition: A | Discharge status: Home / Self Care | Payer: Medicare Other | Attending: Emergency Medicine | Admitting: Emergency Medicine

## 2022-11-18 DIAGNOSIS — Z20822 Contact with and (suspected) exposure to covid-19: Secondary | ICD-10-CM | POA: Insufficient documentation

## 2022-11-18 DIAGNOSIS — F0390 Unspecified dementia without behavioral disturbance: Secondary | ICD-10-CM | POA: Insufficient documentation

## 2022-11-18 DIAGNOSIS — Z91040 Latex allergy status: Secondary | ICD-10-CM | POA: Diagnosis not present

## 2022-11-18 DIAGNOSIS — I10 Essential (primary) hypertension: Secondary | ICD-10-CM | POA: Diagnosis not present

## 2022-11-18 DIAGNOSIS — Z7901 Long term (current) use of anticoagulants: Secondary | ICD-10-CM | POA: Insufficient documentation

## 2022-11-18 DIAGNOSIS — R42 Dizziness and giddiness: Secondary | ICD-10-CM

## 2022-11-18 DIAGNOSIS — Z79899 Other long term (current) drug therapy: Secondary | ICD-10-CM | POA: Insufficient documentation

## 2022-11-18 DIAGNOSIS — I4819 Other persistent atrial fibrillation: Secondary | ICD-10-CM | POA: Diagnosis not present

## 2022-11-18 DIAGNOSIS — J90 Pleural effusion, not elsewhere classified: Secondary | ICD-10-CM | POA: Insufficient documentation

## 2022-11-18 LAB — CBC WITH DIFFERENTIAL/PLATELET
Abs Immature Granulocytes: 0.01 10*3/uL (ref 0.00–0.07)
Basophils Absolute: 0.1 10*3/uL (ref 0.0–0.1)
Basophils Relative: 1 %
Eosinophils Absolute: 0.3 10*3/uL (ref 0.0–0.5)
Eosinophils Relative: 4 %
HCT: 38.3 % (ref 36.0–46.0)
Hemoglobin: 12.3 g/dL (ref 12.0–15.0)
Immature Granulocytes: 0 %
Lymphocytes Relative: 18 %
Lymphs Abs: 1.4 10*3/uL (ref 0.7–4.0)
MCH: 31.7 pg (ref 26.0–34.0)
MCHC: 32.1 g/dL (ref 30.0–36.0)
MCV: 98.7 fL (ref 80.0–100.0)
Monocytes Absolute: 0.7 10*3/uL (ref 0.1–1.0)
Monocytes Relative: 9 %
Neutro Abs: 5.2 10*3/uL (ref 1.7–7.7)
Neutrophils Relative %: 68 %
Platelets: 210 10*3/uL (ref 150–400)
RBC: 3.88 MIL/uL (ref 3.87–5.11)
RDW: 13.6 % (ref 11.5–15.5)
WBC: 7.7 10*3/uL (ref 4.0–10.5)
nRBC: 0 % (ref 0.0–0.2)

## 2022-11-18 LAB — RESP PANEL BY RT-PCR (RSV, FLU A&B, COVID)  RVPGX2
Influenza A by PCR: NEGATIVE
Influenza B by PCR: NEGATIVE
Resp Syncytial Virus by PCR: NEGATIVE
SARS Coronavirus 2 by RT PCR: NEGATIVE

## 2022-11-18 LAB — LACTIC ACID, PLASMA: Lactic Acid, Venous: 1 mmol/L (ref 0.5–1.9)

## 2022-11-18 LAB — COMPREHENSIVE METABOLIC PANEL
ALT: 14 U/L (ref 0–44)
AST: 28 U/L (ref 15–41)
Albumin: 3.3 g/dL — ABNORMAL LOW (ref 3.5–5.0)
Alkaline Phosphatase: 68 U/L (ref 38–126)
Anion gap: 11 (ref 5–15)
BUN: 13 mg/dL (ref 8–23)
CO2: 24 mmol/L (ref 22–32)
Calcium: 9.1 mg/dL (ref 8.9–10.3)
Chloride: 101 mmol/L (ref 98–111)
Creatinine, Ser: 1.06 mg/dL — ABNORMAL HIGH (ref 0.44–1.00)
GFR, Estimated: 51 mL/min — ABNORMAL LOW (ref >60)
Glucose, Bld: 92 mg/dL (ref 70–99)
Potassium: 3.7 mmol/L (ref 3.5–5.1)
Sodium: 136 mmol/L (ref 135–145)
Total Bilirubin: 0.9 mg/dL (ref 0.3–1.2)
Total Protein: 6 g/dL — ABNORMAL LOW (ref 6.5–8.1)

## 2022-11-18 LAB — URINALYSIS, ROUTINE W REFLEX MICROSCOPIC
Bacteria, UA: NONE SEEN
Bilirubin Urine: NEGATIVE
Glucose, UA: NEGATIVE mg/dL
Hgb urine dipstick: NEGATIVE
Ketones, ur: NEGATIVE mg/dL
Leukocytes,Ua: NEGATIVE
Nitrite: NEGATIVE
Protein, ur: 30 mg/dL — AB
Specific Gravity, Urine: 1.013 (ref 1.005–1.030)
pH: 6 (ref 5.0–8.0)

## 2022-11-18 MED ORDER — METOPROLOL TARTRATE 25 MG PO TABS
25.0000 mg | ORAL_TABLET | Freq: Once | ORAL | Status: AC
  Administered 2022-11-18: 25 mg via ORAL
  Filled 2022-11-18: qty 1

## 2022-11-18 MED ORDER — SODIUM CHLORIDE 0.9 % IV BOLUS
500.0000 mL | Freq: Once | INTRAVENOUS | Status: AC
  Administered 2022-11-18: 500 mL via INTRAVENOUS

## 2022-11-18 NOTE — ED Provider Notes (Signed)
[facility] Provider Note   CSN: [id number] Arrival date & time: 11/18/22  1743     History  Chief Complaint  Patient presents with   Weakness   Dizziness    Brittanye Lewter is a 89 y.o. female.  HPI     89 year old with dementia, persistent A-fib on Xarelto, generalized anxiety disorder, essential hypertension who comes to the emergency room with chief complaint of dizziness and weakness.  Patient does not provide meaningful history, likely due to dementia.  She is oriented to self, location.  I spoke with patient's son, Mr. Jimmie Rozsa.  He states that patient has gotten weaker over the last 2 days, and complained of dizziness earlier today.  Patient lives at home with him.  Normally she is able to ambulate, but since yesterday she has been feeling weak and after she has not wanted to walk and stayed in the wheelchair.  Patient also complained of dizziness last night.  Patient denies any headache, neck pain, dizziness, chest pain, shortness of breath, abdominal pain, UTI-like symptoms at this time.  Patient's son states that patient has not had any UTI recently.  Home Medications Prior to Admission medications   Medication Sig Start Date End Date Taking? Authorizing Provider  diclofenac Sodium (VOLTAREN) 1 % GEL Apply 2 g topically 4 (four) times daily. To right wrist Patient taking differently: Apply 2 g topically every 6 (six) hours as needed (wrist pain). 10/02/22   Mercy Riding, MD  felodipine (PLENDIL) 10 MG 24 hr tablet Take 1 tablet (10 mg total) by mouth daily. 08/16/22   Elgergawy, Silver Huguenin, MD  metoprolol tartrate (LOPRESSOR) 50 MG tablet Take 1.5 tablets (75 mg total) by mouth 2 (two) times daily. 10/24/22   Rai, Vernelle Emerald, MD  QUEtiapine (SEROQUEL) 50 MG tablet Take 1 tablet (50 mg total) by mouth at bedtime. 08/15/22   Elgergawy, Silver Huguenin, MD  Rivaroxaban (XARELTO) 15 MG TABS tablet Take 1 tablet (15 mg total) by mouth  daily with supper. 03/06/22   Deboraha Sprang, MD  senna-docusate (SENOKOT-S) 8.6-50 MG tablet Take 1 tablet by mouth at bedtime as needed for mild constipation. 10/02/22   Mercy Riding, MD  valsartan (DIOVAN) 320 MG tablet Take 1 tablet (320 mg total) by mouth daily. 10/02/22   Mercy Riding, MD      Allergies    Hydrochlorothiazide, Penicillins, Ciprofloxacin, Doxycycline, Hydrocodone-acetaminophen, Iodinated contrast media, Iodine-131, Lasix [furosemide], Latex, Statins, Carbamazepine, Codeine, Nickel, Sulfa antibiotics, Sulfamethoxazole, and Sulfonamide derivatives    Review of Systems   Review of Systems  All other systems reviewed and are negative.   Physical Exam Updated Vital Signs BP (!) 163/120   Pulse (!) 112   Temp 98.7 F (37.1 C) (Oral)   Resp 19   Ht '5\' 5"'$  (1.651 m)   Wt 63 kg   SpO2 94%   BMI 23.11 kg/m  Physical Exam Vitals and nursing note reviewed.  Constitutional:      Appearance: She is well-developed.  HENT:     Head: Atraumatic.  Eyes:     Extraocular Movements: Extraocular movements intact.     Pupils: Pupils are equal, round, and reactive to light.     Comments: No nystagmus  Cardiovascular:     Rate and Rhythm: Tachycardia present. Rhythm irregular.  Pulmonary:     Effort: Pulmonary effort is normal.  Abdominal:     Tenderness: There is no abdominal tenderness.  Musculoskeletal:  Cervical back: Normal range of motion and neck supple.     Right lower leg: No edema.     Left lower leg: No edema.  Skin:    General: Skin is warm and dry.  Neurological:     Mental Status: She is alert and oriented to person, place, and time.     Cranial Nerves: No cranial nerve deficit.     Sensory: No sensory deficit.     Motor: No weakness.     Coordination: Coordination normal.     Comments: No dysmetria     ED Results / Procedures / Treatments   Labs (all labs ordered are listed, but only abnormal results are displayed) Labs Reviewed  RESP PANEL  BY RT-PCR (RSV, FLU A&B, COVID)  [id number]  CBC WITH DIFFERENTIAL/PLATELET  COMPREHENSIVE METABOLIC PANEL  URINALYSIS, ROUTINE W REFLEX MICROSCOPIC  LACTIC ACID, PLASMA    EKG EKG Interpretation  Date/Time:  Saturday November 18 2022 17:54:33 EST Ventricular Rate:  102 PR Interval:    QRS Duration: 89 QT Interval:  345 QTC Calculation: 450 R Axis:   -54 Text Interpretation: Atrial fibrillation Left anterior fascicular block Abnormal R-wave progression, late transition Repol abnrm suggests ischemia, lateral leads TWI in lateral in the lateral leads are new Confirmed by Varney Biles [id number]) on 11/18/2022 7:38:58 PM  Radiology DG Chest Port 1 View  Result Date: 11/18/2022 CLINICAL DATA:  Right pleural effusion, weakness and dizziness. EXAM: PORTABLE CHEST 1 VIEW COMPARISON:  PA Lat 11/15/2022, portable chest 11/03/2022 and chest CT no contrast 09/27/2022 FINDINGS: The heart is enlarged but unchanged. No vascular congestion is seen. The mediastinum is stable with aortic tortuosity and scattered calcific plaques, mild aortic ectasia. A chronic moderate right pleural effusion continues to be seen, with overlying atelectasis. Overall aeration seems unchanged.  The lungs are otherwise clear. Degenerative change again noted spine and both shoulders. IMPRESSION: 1. No interval change. Chronic moderate right pleural effusion with overlying atelectasis. Underlying pneumonia is difficult to exclude. 2. Cardiomegaly without vascular congestion. 3. Aortic atherosclerosis and uncoiling. Electronically Signed   By: Telford Nab M.D.   On: 11/18/2022 20:05    Procedures Procedures    Medications Ordered in ED Medications  sodium chloride 0.9 % bolus 500 mL (500 mLs Intravenous New Bag/Given 11/18/22 2008)    ED Course/ Medical Decision Making/ A&P                             Medical Decision Making Amount and/or Complexity of Data Reviewed Labs: ordered. Radiology: ordered.  Risk Prescription drug  management.   This patient presents to the ED with chief complaint(s) of dizziness, weakness with pertinent past medical history of A-fib, vertigo, hypertension and hyperlipidemia.The complaint involves an extensive differential diagnosis and also carries with it a high risk of complications and morbidity.    The differential diagnosis includes : Orthostatic dizziness, symptomatic A-fib, stroke, dehydration, severe electrolyte abnormality, UTI  Patient's neuroexam is completely nonfocal.  She has history of vertigo, she has had MRI in the past for dizziness which were negative for stroke.  Low suspicion for stroke at this time we will not get CT scan of the brain or MRI.  The initial plan is to get basic labs, x-ray of the chest.   Additional history obtained: Additional history obtained from family -patient's son who provides substantial part of the history Records reviewed previous admission documents and previous ER visits for dizziness and  MRI results from code stroke on 11-30 that showed no evidence of stroke at that time.  CT angiogram showed completely normal posterior circulation at that time as well.  I have independently interpreted patient's x-ray of the chest.  There is no clear evidence of large pleural effusion.  Per radiologist, the pleural effusion is moderate and it is at baseline.  Treatment and Reassessment: Patient's care has been signed out to Dr. Reather Converse.  -Labs pending -Fluid bolus ordered.  Orthostatics ordered. -Ambulation ordered.  If all the labs are reassuring, patient is ambulated then she can be discharged.  Final Clinical Impression(s) / ED Diagnoses Final diagnoses:  None    Rx / DC Orders ED Discharge Orders     None         Varney Biles, MD 11/18/22 2115

## 2022-11-18 NOTE — ED Triage Notes (Signed)
Pt to the ed from home via ems with a CC of dizziness/ weakness per EMS. Pt lives at home with family that relay she has been increasingly weak and dizzy the last day. Pt denies complaints at this time. Pt denies cp, sob, dizziness at this time.

## 2022-11-18 NOTE — ED Notes (Signed)
Pt report received from previous nurse. Pt A&O x2 at baseline, vitals stable, denies needs/complaints. Call bell in reach. No acute distress noted.

## 2022-11-18 NOTE — ED Notes (Signed)
Patients son is aware the patient is being discharged. He will pick her up shortly.

## 2022-11-18 NOTE — Discharge Instructions (Signed)
Stay well-hydrated and follow-up with your local doctor this week. Your blood work was reassuring. If you develop chest pain, shortness of breath, passout or new concerns return to the ER over the weekend.

## 2022-11-18 NOTE — ED Provider Notes (Signed)
Patient care signed out to follow-up blood work and reassess.  Patient presented lightheadedness general weakness.  Symptoms are resolved patient states she feels well at baseline.  Vital signs unremarkable except for elevated blood pressure.  Patient having no stroke symptoms or signs, no chest pain or shortness of breath.  Blood work independently reviewed results unremarkable.  Patient requesting go home.  Family member called to pick up.  Patient ambulated without dizziness or symptoms.  Lightheadedness  Persistent atrial fibrillation ([place])  Recurrent right pleural effusion    Tiffany Morrison, MD 11/18/22 2335

## 2022-11-18 NOTE — ED Notes (Signed)
Ambulated pt, walking without dizziness, stable. No acute distress noted.

## 2022-11-20 ENCOUNTER — Emergency Department (HOSPITAL_COMMUNITY)
Admission: EM | Admit: 2022-11-20 | Discharge: 2022-11-21 | Disposition: A | Discharge status: Home / Self Care | Payer: Medicare Other | Attending: Emergency Medicine | Admitting: Emergency Medicine

## 2022-11-20 ENCOUNTER — Other Ambulatory Visit: Payer: Self-pay

## 2022-11-20 ENCOUNTER — Encounter (HOSPITAL_COMMUNITY): Payer: Self-pay

## 2022-11-20 DIAGNOSIS — I10 Essential (primary) hypertension: Secondary | ICD-10-CM | POA: Insufficient documentation

## 2022-11-20 DIAGNOSIS — R42 Dizziness and giddiness: Secondary | ICD-10-CM | POA: Insufficient documentation

## 2022-11-20 DIAGNOSIS — Z91040 Latex allergy status: Secondary | ICD-10-CM | POA: Diagnosis not present

## 2022-11-20 DIAGNOSIS — Z7901 Long term (current) use of anticoagulants: Secondary | ICD-10-CM | POA: Insufficient documentation

## 2022-11-20 DIAGNOSIS — I4891 Unspecified atrial fibrillation: Secondary | ICD-10-CM | POA: Diagnosis not present

## 2022-11-20 DIAGNOSIS — F0390 Unspecified dementia without behavioral disturbance: Secondary | ICD-10-CM | POA: Insufficient documentation

## 2022-11-20 LAB — COMPREHENSIVE METABOLIC PANEL
ALT: 27 U/L (ref 0–44)
AST: 92 U/L — ABNORMAL HIGH (ref 15–41)
Albumin: 3.2 g/dL — ABNORMAL LOW (ref 3.5–5.0)
Alkaline Phosphatase: 62 U/L (ref 38–126)
Anion gap: 9 (ref 5–15)
BUN: 13 mg/dL (ref 8–23)
CO2: 25 mmol/L (ref 22–32)
Calcium: 8.9 mg/dL (ref 8.9–10.3)
Chloride: 104 mmol/L (ref 98–111)
Creatinine, Ser: 1.15 mg/dL — ABNORMAL HIGH (ref 0.44–1.00)
GFR, Estimated: 46 mL/min — ABNORMAL LOW (ref >60)
Glucose, Bld: 95 mg/dL (ref 70–99)
Potassium: 4.9 mmol/L (ref 3.5–5.1)
Sodium: 138 mmol/L (ref 135–145)
Total Bilirubin: 1.3 mg/dL — ABNORMAL HIGH (ref 0.3–1.2)
Total Protein: 5.5 g/dL — ABNORMAL LOW (ref 6.5–8.1)

## 2022-11-20 LAB — CBC WITH DIFFERENTIAL/PLATELET
Abs Immature Granulocytes: 0 10*3/uL (ref 0.00–0.07)
Basophils Absolute: 0.1 10*3/uL (ref 0.0–0.1)
Basophils Relative: 1 %
Eosinophils Absolute: 0.3 10*3/uL (ref 0.0–0.5)
Eosinophils Relative: 5 %
HCT: 36.6 % (ref 36.0–46.0)
Hemoglobin: 11.8 g/dL — ABNORMAL LOW (ref 12.0–15.0)
Immature Granulocytes: 0 %
Lymphocytes Relative: 25 %
Lymphs Abs: 1.7 10*3/uL (ref 0.7–4.0)
MCH: 31.6 pg (ref 26.0–34.0)
MCHC: 32.2 g/dL (ref 30.0–36.0)
MCV: 97.9 fL (ref 80.0–100.0)
Monocytes Absolute: 0.7 10*3/uL (ref 0.1–1.0)
Monocytes Relative: 10 %
Neutro Abs: 4 10*3/uL (ref 1.7–7.7)
Neutrophils Relative %: 59 %
Platelets: 210 10*3/uL (ref 150–400)
RBC: 3.74 MIL/uL — ABNORMAL LOW (ref 3.87–5.11)
RDW: 13.8 % (ref 11.5–15.5)
WBC: 6.7 10*3/uL (ref 4.0–10.5)
nRBC: 0 % (ref 0.0–0.2)

## 2022-11-20 NOTE — Discharge Instructions (Signed)
You were seen in the emergency department for your dizziness.  Your heart was in A-fib but at a normal rate and you had no signs of dehydration, abnormal electrolytes or anemia.  It is unclear what has been causing your dizzy spells but you should make sure that you are eating and drinking well and taking your medications as prescribed.  You should follow-up with your primary doctor and your cardiologist to have your symptoms rechecked.  You should return to the emergency department for persistent dizziness that does not go away, if you pass out, if you have severe chest pain or shortness of breath or if you have any other new or concerning symptoms.

## 2022-11-20 NOTE — ED Provider Notes (Signed)
[facility] Provider Note   CSN: [id number] Arrival date & time: 11/20/22  1943     History  Chief Complaint  Patient presents with   Dizziness    Tiffany Black is a 89 y.o. female.  Patient is an 89 year old female with a past medical history of dementia, A-fib on Xarelto and hypertension that presented to the emergency department with dizziness.  Per her son she called for a wellness check this morning for a mental health check but states that she had no complaints and was cleared.  He states that when he got off work this evening she started to complain of feeling dizzy.  He states that it seemed worse when she was switching positions.  He states that she complained of feeling generally weak.  He states that she did not pass out did not complain of any chest pain or shortness of breath.  He states he was seen here 2 days ago with similar symptoms.  He states that she was seen by her primary doctor last week for regular appointment and had no medication changes.  On the patient's arrival here she states that she has no dizziness and feels completely fine without any acute complaints.  The history is provided by the patient, the EMS personnel and a relative. History limited by: Level 5 caveat for dementia.  Dizziness      Home Medications Prior to Admission medications   Medication Sig Start Date End Date Taking? Authorizing Provider  diclofenac Sodium (VOLTAREN) 1 % GEL Apply 2 g topically 4 (four) times daily. To right wrist Patient taking differently: Apply 2 g topically every 6 (six) hours as needed (wrist pain). 10/02/22   Mercy Riding, MD  felodipine (PLENDIL) 10 MG 24 hr tablet Take 1 tablet (10 mg total) by mouth daily. 08/16/22   Elgergawy, Silver Huguenin, MD  metoprolol tartrate (LOPRESSOR) 50 MG tablet Take 1.5 tablets (75 mg total) by mouth 2 (two) times daily. 10/24/22   Rai, Vernelle Emerald, MD  QUEtiapine (SEROQUEL) 50 MG tablet Take 1  tablet (50 mg total) by mouth at bedtime. 08/15/22   Elgergawy, Silver Huguenin, MD  Rivaroxaban (XARELTO) 15 MG TABS tablet Take 1 tablet (15 mg total) by mouth daily with supper. 03/06/22   Deboraha Sprang, MD  senna-docusate (SENOKOT-S) 8.6-50 MG tablet Take 1 tablet by mouth at bedtime as needed for mild constipation. 10/02/22   Mercy Riding, MD  valsartan (DIOVAN) 320 MG tablet Take 1 tablet (320 mg total) by mouth daily. 10/02/22   Mercy Riding, MD      Allergies    Hydrochlorothiazide, Penicillins, Ciprofloxacin, Doxycycline, Hydrocodone-acetaminophen, Iodinated contrast media, Iodine-131, Lasix [furosemide], Latex, Statins, Carbamazepine, Codeine, Nickel, Sulfa antibiotics, Sulfamethoxazole, and Sulfonamide derivatives    Review of Systems   Review of Systems  Neurological:  Positive for dizziness.    Physical Exam Updated Vital Signs BP (!) 176/101 (BP Location: Left Arm)   Pulse (!) 104   Temp 98.1 F (36.7 C) (Oral)   Resp 18   SpO2 99%  Physical Exam Vitals and nursing note reviewed.  Constitutional:      General: She is not in acute distress.    Appearance: Normal appearance.  HENT:     Head: Normocephalic and atraumatic.     Nose: Nose normal.     Mouth/Throat:     Mouth: Mucous membranes are moist.     Pharynx: Oropharynx is clear.  Eyes:  Extraocular Movements: Extraocular movements intact.     Conjunctiva/sclera: Conjunctivae normal.     Pupils: Pupils are equal, round, and reactive to light.     Comments: No nystagmus  Cardiovascular:     Rate and Rhythm: Normal rate. Rhythm irregular.     Pulses: Normal pulses.     Heart sounds: Normal heart sounds.  Pulmonary:     Effort: Pulmonary effort is normal.     Breath sounds: Normal breath sounds.  Abdominal:     General: Abdomen is flat.     Palpations: Abdomen is soft.     Tenderness: There is no abdominal tenderness.  Musculoskeletal:        General: Normal range of motion.     Cervical back: Normal range  of motion and neck supple.     Right lower leg: No edema.     Left lower leg: No edema.  Skin:    General: Skin is warm and dry.  Neurological:     General: No focal deficit present.     Mental Status: She is alert.     Cranial Nerves: No cranial nerve deficit.     Sensory: No sensory deficit.     Motor: No weakness.     Coordination: Coordination normal.     Comments: Oriented to person and place  Psychiatric:        Mood and Affect: Mood normal.        Behavior: Behavior normal.     ED Results / Procedures / Treatments   Labs (all labs ordered are listed, but only abnormal results are displayed) Labs Reviewed  COMPREHENSIVE METABOLIC PANEL - Abnormal; Notable for the following components:      Result Value   Creatinine, Ser 1.15 (*)    Total Protein 5.5 (*)    Albumin 3.2 (*)    AST 92 (*)    Total Bilirubin 1.3 (*)    GFR, Estimated 46 (*)    All other components within normal limits  CBC WITH DIFFERENTIAL/PLATELET - Abnormal; Notable for the following components:   RBC 3.74 (*)    Hemoglobin 11.8 (*)    All other components within normal limits    EKG None  Radiology No results found.  Procedures Procedures    Medications Ordered in ED Medications - No data to display  ED Course/ Medical Decision Making/ A&P Clinical Course as of 11/20/22 2321  Mon Nov 20, 2022  2319 Patient's labs are at her baseline without acute abnormalities.  I spoke with patient's son he feels comfortable with her discharge home.  She was recommended primary care and cardiology follow-up and was given strict return precautions. [VK]    Clinical Course User Index [VK] Kemper Durie, DO                             Medical Decision Making This patient presents to the ED with chief complaint(s) of dizziness with pertinent past medical history of a fib, HTN which further complicates the presenting complaint. The complaint involves an extensive differential diagnosis and also  carries with it a high risk of complications and morbidity.    The differential diagnosis includes arrhythmia, anemia, electrolyte abnormality, dehydration, orthostatic hypotension, patient has no focal neurologic deficits normal coordination making CVA unlikely,  Additional history obtained: Additional history obtained from family and EMS  Records reviewed [place] and recent ED records  ED Course and Reassessment: Patient  was recently seen in the emergency department on Saturday with similar dizziness.  Her workup at that time including normal labs, negative urine and a stable appearing pleural effusion on chest x-ray and she tested negative for COVID, flu and RSV.  The patient denies any complaints at this time however due to her dementia she will have EKG and repeat labs performed and she will be closely reassessed.  Independent labs interpretation:  The following labs were independently interpreted: within normal range  Independent visualization of imaging: N/A  Consultation: N/A  Consideration for admission or further workup: Patient has no emergent conditions requiring admission or further work-up at this time and is stable for discharge home with primary care follow-up  Social Determinants of health: N/A    Amount and/or Complexity of Data Reviewed Labs: ordered.          Final Clinical Impression(s) / ED Diagnoses Final diagnoses:  Dizziness    Rx / DC Orders ED Discharge Orders     None         Kemper Durie, DO 11/20/22 2321

## 2022-11-20 NOTE — ED Notes (Signed)
Took pt to bathroom, pt unable to provide a urine sample.

## 2022-11-20 NOTE — ED Triage Notes (Addendum)
Pt bib [facility] from home where she lives with her son. Per EMS pt says she is dizzy and weak but pt arrives denying both.  Pt was seen recently for the same. Hx of dementia  144/100, 110afib, 109CBG

## 2022-11-20 NOTE — ED Notes (Signed)
Pt stated she has to get home to her children and she feels fine enough to go home. Pt requested to speak to her nurse. Nurse notified.

## 2022-11-22 ENCOUNTER — Encounter: Payer: Self-pay | Admitting: Physician Assistant

## 2022-11-22 ENCOUNTER — Ambulatory Visit: Payer: Medicare Other | Attending: Physician Assistant | Admitting: Physician Assistant

## 2022-11-22 VITALS — BP 164/93 | HR 88 | Ht 65.0 in | Wt 130.2 lb

## 2022-11-22 DIAGNOSIS — I4821 Permanent atrial fibrillation: Secondary | ICD-10-CM

## 2022-11-22 DIAGNOSIS — D6869 Other thrombophilia: Secondary | ICD-10-CM | POA: Diagnosis not present

## 2022-11-22 DIAGNOSIS — I10 Essential (primary) hypertension: Secondary | ICD-10-CM | POA: Diagnosis not present

## 2022-11-22 MED ORDER — METOPROLOL TARTRATE 100 MG PO TABS: 100.0000 mg | ORAL_TABLET | Freq: Two times a day (BID) | ORAL | 2 refills | Status: DC

## 2022-11-22 NOTE — Patient Instructions (Addendum)
Medication Instructions:   START TAKING:  LOPRESSOR 100 MG TWICE A DAY    *If you need a refill on your cardiac medications before your next appointment, please call your pharmacy*   Lab Work: NONE ORDERED  TODAY   If you have labs (blood work) drawn today and your tests are completely normal, you will receive your results only by: [place] (if you have MyChart) OR A paper copy in the mail If you have any lab test that is abnormal or we need to change your treatment, we will call you to review the results.   Testing/Procedures: NONE ORDERED  TODAY    Follow-Up: At [facility], you and your health needs are our priority.  As part of our continuing mission to provide you with exceptional heart care, we have created designated Provider Care Teams.  These Care Teams include your primary Cardiologist (physician) and Advanced Practice Providers (APPs -  Physician Assistants and Nurse Practitioners) who all work together to provide you with the care you need, when you need it.  We recommend signing up for the patient portal called "MyChart".  Sign up information is provided on this After Visit Summary.  MyChart is used to connect with patients for Virtual Visits (Telemedicine).  Patients are able to view lab/test results, encounter notes, upcoming appointments, etc.  Non-urgent messages can be sent to your provider as well.   To learn more about what you can do with MyChart, go to [email or web address].    Your next appointment:   2 month(s)  Provider:  Caryl Comes      Other Instructions

## 2022-11-22 NOTE — Progress Notes (Addendum)
Cardiology Office Note Date:  11/22/2022  Patient ID:  Tiffany Black, Tiffany Black [birth year], MRN [id number] PCP:  Ginger Organ., MD  Electrophysiologist: Dr. Caryl Comes    Chief Complaint:  planned f/u  History of Present Illness: Tiffany Black is a 89 y.o. female with history of permanent AFib, dementia, TIA, CKD (III), orthostatic hypotension, anxiety  She come sin today to be seen for Dr. Caryl Comes, last seen by him Dec 2022, orthostatic symptoms not significantly problematic, AFib was rate controlled.  More recently she saw Jonni Sanger in May 2023, this in follow up to pt calls with concerns though she at the visit could not recall any specifics at least, she was rate controlled and not with any active complaints.  Discussed prior recs for spironolactone, (perhaps 2/2 hypokalemia) though was not on it it seems. In review of phone notes he called in symptoms sounded of her known orthstatic issues.  ER visit 05/30/22 with initial c/o palpitations then dizziness.  Seems she was unagreeable to any meds and some testing as well, declined neuro testing.  Got frustrated with them it seems.     I saw her 06/13/22 She reports doing quite well. She lives with her son, he works at  [employer], mentions another that has recently retired, she is quite proud of them both. She gets the mail and stays busy about the house, but no formal exercise necessarily, likes to walk in the park but has not done that in some time now, because of weather. She denies CP, palpitations or any cardiac concerns today No SOB No falls or fainting No bleeding Denies any dizziness or dizzy spells  She brings a list of medicines that is different the what her pharmacy reports. She wrote her list of medicines from the bottles this AM, so feels like her list is the accurate one. She is forgetful here, asked questions on some things that we had just discussed.  Gets a little frustrated with my questioning her medication list, assures  me she knows what she is doing. Labs a couple weeks ago with mild hypokalemia and reduced H/H from her last Tried to explain the importance and my concern in the discrepancy of medications lists, asked to have her back soon with her medication bottles so we knew what she was taking.  ER visit 07/08/22, c/o dizziness, note a BP 193/139, reported she had not taken her BP pills but had taken her xarelto, given a dose of labetalol, CT head was OK,  and ultimately d/c from the ER, labs were stable  She saw Dr. Brigitte Pulse 07/13/22, her dementia progressing and medication compliance increasingly challenging and had declined/refused her son's (who she lives with) attempts to help with this.  Dr. Brigitte Pulse reached out to make [place] aware of this.  Asked that we managed her BP and try to assure hat she is compliant with meds, particularly her Xarelto.  I saw her 07/14/22 She is initially alone in the room, but agreeable to have her son come in to help the visit. She admits today that her memory is not what it used to be and agreeable to have her son help with filling her pill box. She brings her medicines today and her pill boxes. She denies any symptoms, no CP Does not recall her EMS/ER trip No reports of syncope by either of them. No SOB No bleeding She brings a written list of medicines from 2021, that does not align with pill bottles. She has  2 bottles nearly full of fluoxetine, and pill boxes do not have any in them. She doesn't know why, but does not appear to be taking it. Her valsartan bottle is empty, her son says that there are 2 prescriptions ready for them to pick up on the way home.  He believes that is one of them. Her morning pill box does not have any metoprolol in it as far as I can tell. Not sure if there is any valsartan PM box does have xarelto in it Pt was agreeable to have her son help out with her pills/pill box, planned to have her back in a few months to f/u.  Numerous ER visits and  admissions  Admitted 08/09/22 w/AMS and transient speech changes, fallnegative neuro w/u, started on seroquel Discharged 08/15/22 to SNF  Admitted 08/26/22 SOB, COVID + Discharged 08/29/22  Admitted 09/27/22 admitted poor oral intake, fall, large effusion, PNA, had thora (hemothorax), rib fx,  Discharged 10/03/22  Admitted 10/20/22, fall (unwitness, found on floor), , recurrent R effusion, thora, worsening impulsiveness,  and memory Discharged 10/24/22  ER 11/18/22, lightheaded/weak, discharged, no particular diagnosis, given IVF  ER 11/20/22, dizziness, BP elevated 176/101, discharged same day  K+ 4.9 BUN/Creat 13/1.15 WBC 6.7 H/H 11/36 Plts 210  TODAY She is accompanied by her son, lives with him home now, he mentions her mood was much worse at the SNF.  She reports feeling well, that being said surprised to hear that she had been in/out of the hospital with no recollections of them.  She denies CP, SOB Sin agrees, she does not seem to c/o that at home Dizziness has been the most persistent symptoms, in his best assessment based on what she says/how she looks, is more of an off balance , she has not mentioned or looked near faint.  Her son says that mostly things are better at home as far as medication compliance and letting him help her.  She has been to the pulmonologist, had another out patient thora, and advised to continue to hold her Eliquis for now at least that she may require a "tube" be placed He has plans to reach out to Dr. Erin Fulling soon, they don't have an appt currently. His son isn't certain, but thinks diuretics were mentioned but not felt would be helpful   Past Medical History:  Diagnosis Date   Anxiety    Arthritis    Bulging of cervical intervertebral disc    Cardiomegaly    Cataracts, bilateral    Chronic neck pain    Dementia ([place])    Essential hypertension    History of endometrial cancer    History of stroke    2015 and 2016 // carotid US 8/16:  Bilateral 1-39 // carotid US 2/16: Bilateral ICA 1-39   HLD (hyperlipidemia)    Hx of echocardiogram    a. Echo (11/15): Mild LVH, EF 55-60%, normal wall motion, grade 2 diastolic dysfunction, mild LAE, normal RV function, PASP 29 mm Hg // echo 8/16:Mild concentric LVH, EF 55-60, normal wall motion, grade 2 diastolic dysfunction, mild LAE, PASP 39   Hypertension    Insomnia    Lung nodule    OA (osteoarthritis)    Pectus excavatum    Persistent atrial fibrillation (HCC)    Vertigo     Past Surgical History:  Procedure Laterality Date   ABDOMINAL HYSTERECTOMY  2011   CATARACT EXTRACTION, BILATERAL Bilateral 2009   CESAREAN SECTION  1975   IR THORACENTESIS ASP PLEURAL  SPACE W/IMG GUIDE  09/28/2022   IR THORACENTESIS ASP PLEURAL SPACE W/IMG GUIDE  10/23/2022   LAPAROSCOPIC HYSTERECTOMY  2011   THORACENTESIS Right 11/03/2022   Procedure: THORACENTESIS;  Surgeon: [name], MD;  Location: [facility];  Service: Pulmonary;  Laterality: Right;    Current Outpatient Medications  Medication Sig Dispense Refill   diclofenac Sodium (VOLTAREN) 1 % GEL Apply 2 g topically 4 (four) times daily. To right wrist (Patient taking differently: Apply 2 g topically every 6 (six) hours as needed (wrist pain).)     felodipine (PLENDIL) 10 MG 24 hr tablet Take 1 tablet (10 mg total) by mouth daily.     metoprolol tartrate (LOPRESSOR) 50 MG tablet Take 1.5 tablets (75 mg total) by mouth 2 (two) times daily.     QUEtiapine (SEROQUEL) 50 MG tablet Take 1 tablet (50 mg total) by mouth at bedtime.     Rivaroxaban (XARELTO) 15 MG TABS tablet Take 1 tablet (15 mg total) by mouth daily with supper. 30 tablet 5   senna-docusate (SENOKOT-S) 8.6-50 MG tablet Take 1 tablet by mouth at bedtime as needed for mild constipation.     valsartan (DIOVAN) 320 MG tablet Take 1 tablet (320 mg total) by mouth daily.     No current facility-administered medications for this visit.    Allergies:   Hydrochlorothiazide,  Penicillins, Ciprofloxacin, Doxycycline, Hydrocodone-acetaminophen, Iodinated contrast media, Iodine-131, Lasix [furosemide], Latex, Statins, Carbamazepine, Codeine, Nickel, Sulfa antibiotics, Sulfamethoxazole, and Sulfonamide derivatives   Social History:  The patient  reports that she has never smoked. She has never used smokeless tobacco. She reports that she does not drink alcohol and does not use drugs.   Family History:  The patient's family history includes Dementia in her mother; Heart attack in her father; Heart disease in her father; Stroke in her maternal uncle.  ROS:  Please see the history of present illness.    All other systems are reviewed and otherwise negative.   PHYSICAL EXAM:  VS:  There were no vitals taken for this visit. BMI: There is no height or weight on file to calculate BMI. Well nourished, well developed, in no acute distress HEENT: normocephalic, atraumatic Neck: no JVD, carotid bruits or masses Cardiac:   irreg-irreg; no significant murmurs, no rubs, or gallops Lungs: diminished R base, otherwise CTA no wheezing, rhonchi or rales Abd: soft, nontender MS: no deformity or atrophy Ext: trace if any edema Skin: warm and dry, no rash Neuro:  No gross deficits appreciated Psych: euthymic mood, full affect    EKG:  not done today  04/10/2017: TTE Study Conclusions  - Procedure narrative: Transthoracic echocardiography. Image    quality was adequate. Intravenous contrast (Definity) was    administered.  - Left ventricle: The cavity size was normal. Wall thickness was    increased in a pattern of mild LVH. Systolic function was normal.    The estimated ejection fraction was in the range of 55% to 60%.    Wall motion was normal; there were no regional wall motion    abnormalities.  - Mitral valve: Calcified annulus.  - Left atrium: The atrium was mildly dilated.  - Right atrium: The atrium was mildly dilated.  - Pulmonary arteries: Systolic pressure was  mildly increased. PA    peak pressure: 40 mm Hg (S).   Impressions:  - Definity used; normal LV systolic function; mild LVH; mild    biatrial enlargement; mild TR with mildly elevated pulmonary    pressure.  Recent Labs: 09/26/2022: B Natriuretic Peptide 204.0 10/21/2022: Magnesium 1.9; TSH 1.704 11/20/2022: ALT 27; BUN 13; Creatinine, Ser 1.15; Hemoglobin 11.8; Platelets 210; Potassium 4.9; Sodium 138  08/11/2022: Cholesterol 189; HDL 33; LDL Cholesterol 139; Total CHOL/HDL Ratio 5.7; Triglycerides 83; VLDL 17   Estimated Creatinine Clearance: 31 mL/min (A) (by C-G formula based on SCr of 1.15 mg/dL (H)).   Wt Readings from Last 3 Encounters:  11/18/22 138 lb 14.2 oz (63 kg)  11/03/22 138 lb 14.2 oz (63 kg)  11/01/22 139 lb (63 kg)     Other studies reviewed: Additional studies/records reviewed today include: summarized above  ASSESSMENT AND PLAN:  Permanent Afib  CHA2DS2Vasc is 7, on Xarelto, appropriately dosed rate controlled  Eliquis currently on hold, per pulmonary for now Advised to resume once cleared by pulmonology.  She has not had further falls   HTN  w/hx of orthostatic dizziness and hypotension also mentioned in her hx Some degree of dizziness, though these seem to be associated with high BP readings by her son's observation Though BP tend to get quite high A repeat BP today 148/88 Will increase lopressor to '100mg'$  BID    3. Secondary hypercoagulable state   4. Recurrent pleural effusions S/p a couple thoras now Unclear etiology Unknown if there is a role for diuretic Following with pulmonary    Disposition: back in a couple months, sooner if needed     Current medicines are reviewed at length with the patient today.  The patient did not have any concerns regarding medicines.  Venetia Night, PA-C 11/22/2022 7:02 AM     [facility] [place] [id number] [phone] (office)  [phone]  (fax)

## 2022-11-27 ENCOUNTER — Encounter: Payer: Self-pay | Admitting: *Deleted

## 2022-11-27 NOTE — Progress Notes (Signed)
Source: referral notes from [facility]

## 2022-11-28 ENCOUNTER — Ambulatory Visit: Payer: Medicare Other | Admitting: Neurology

## 2022-12-08 ENCOUNTER — Emergency Department (HOSPITAL_COMMUNITY): Payer: Medicare Other

## 2022-12-08 ENCOUNTER — Emergency Department (HOSPITAL_COMMUNITY)
Admission: EM | Admit: 2022-12-08 | Discharge: 2022-12-08 | Disposition: A | Discharge status: Home / Self Care | Payer: Medicare Other | Attending: Emergency Medicine | Admitting: Emergency Medicine

## 2022-12-08 ENCOUNTER — Encounter (HOSPITAL_COMMUNITY): Payer: Self-pay

## 2022-12-08 ENCOUNTER — Other Ambulatory Visit: Payer: Self-pay

## 2022-12-08 DIAGNOSIS — N390 Urinary tract infection, site not specified: Secondary | ICD-10-CM | POA: Insufficient documentation

## 2022-12-08 DIAGNOSIS — Z91040 Latex allergy status: Secondary | ICD-10-CM | POA: Diagnosis not present

## 2022-12-08 DIAGNOSIS — R42 Dizziness and giddiness: Secondary | ICD-10-CM | POA: Diagnosis present

## 2022-12-08 LAB — COMPREHENSIVE METABOLIC PANEL
ALT: 14 U/L (ref 0–44)
AST: 25 U/L (ref 15–41)
Albumin: 3.7 g/dL (ref 3.5–5.0)
Alkaline Phosphatase: 62 U/L (ref 38–126)
Anion gap: 13 (ref 5–15)
BUN: 13 mg/dL (ref 8–23)
CO2: 24 mmol/L (ref 22–32)
Calcium: 9.4 mg/dL (ref 8.9–10.3)
Chloride: 97 mmol/L — ABNORMAL LOW (ref 98–111)
Creatinine, Ser: 1.27 mg/dL — ABNORMAL HIGH (ref 0.44–1.00)
GFR, Estimated: 41 mL/min — ABNORMAL LOW (ref >60)
Glucose, Bld: 94 mg/dL (ref 70–99)
Potassium: 3.8 mmol/L (ref 3.5–5.1)
Sodium: 134 mmol/L — ABNORMAL LOW (ref 135–145)
Total Bilirubin: 0.7 mg/dL (ref 0.3–1.2)
Total Protein: 6.3 g/dL — ABNORMAL LOW (ref 6.5–8.1)

## 2022-12-08 LAB — URINALYSIS, ROUTINE W REFLEX MICROSCOPIC
Bilirubin Urine: NEGATIVE
Glucose, UA: NEGATIVE mg/dL
Ketones, ur: NEGATIVE mg/dL
Nitrite: NEGATIVE
Protein, ur: 30 mg/dL — AB
Specific Gravity, Urine: 1.008 (ref 1.005–1.030)
WBC, UA: >50 WBC/hpf (ref 0–5)
pH: 5 (ref 5.0–8.0)

## 2022-12-08 LAB — CBC WITH DIFFERENTIAL/PLATELET
Abs Immature Granulocytes: 0.02 10*3/uL (ref 0.00–0.07)
Basophils Absolute: 0.1 10*3/uL (ref 0.0–0.1)
Basophils Relative: 1 %
Eosinophils Absolute: 0.4 10*3/uL (ref 0.0–0.5)
Eosinophils Relative: 9 %
HCT: 40.1 % (ref 36.0–46.0)
Hemoglobin: 12.8 g/dL (ref 12.0–15.0)
Immature Granulocytes: 0 %
Lymphocytes Relative: 28 %
Lymphs Abs: 1.3 10*3/uL (ref 0.7–4.0)
MCH: 31.1 pg (ref 26.0–34.0)
MCHC: 31.9 g/dL (ref 30.0–36.0)
MCV: 97.3 fL (ref 80.0–100.0)
Monocytes Absolute: 0.4 10*3/uL (ref 0.1–1.0)
Monocytes Relative: 9 %
Neutro Abs: 2.4 10*3/uL (ref 1.7–7.7)
Neutrophils Relative %: 53 %
Platelets: 90 10*3/uL — ABNORMAL LOW (ref 150–400)
RBC: 4.12 MIL/uL (ref 3.87–5.11)
RDW: 13.4 % (ref 11.5–15.5)
WBC: 4.6 10*3/uL (ref 4.0–10.5)
nRBC: 0 % (ref 0.0–0.2)

## 2022-12-08 LAB — LACTIC ACID, PLASMA: Lactic Acid, Venous: 1.5 mmol/L (ref 0.5–1.9)

## 2022-12-08 LAB — CBG MONITORING, ED: Glucose-Capillary: 86 mg/dL (ref 70–99)

## 2022-12-08 LAB — AMMONIA: Ammonia: 14 umol/L (ref 9–35)

## 2022-12-08 MED ORDER — LACTATED RINGERS IV BOLUS
1000.0000 mL | Freq: Once | INTRAVENOUS | Status: AC
  Administered 2022-12-08: 1000 mL via INTRAVENOUS

## 2022-12-08 MED ORDER — CEPHALEXIN 500 MG PO CAPS: 500.0000 mg | ORAL_CAPSULE | Freq: Four times a day (QID) | ORAL | 0 refills | Status: DC

## 2022-12-08 MED ORDER — NITROFURANTOIN MONOHYD MACRO 100 MG PO CAPS: 100.0000 mg | ORAL_CAPSULE | Freq: Once | ORAL | Status: DC

## 2022-12-08 MED ORDER — SODIUM CHLORIDE 0.9 % IV SOLN
2.0000 g | Freq: Once | INTRAVENOUS | Status: AC
  Administered 2022-12-08: 2 g via INTRAVENOUS
  Filled 2022-12-08: qty 20

## 2022-12-08 NOTE — ED Notes (Signed)
Son(Chris) aware of pt's discharge, ETA for son 1hour.

## 2022-12-08 NOTE — ED Provider Notes (Signed)
Emergency Medicine Provider Triage Evaluation Note  Makensy Viands , a 89 y.o. female  was evaluated in triage.  Pt complains of dizziness and generalized weakness that has now resolved.  Review of Systems  Positive: Dizziness and weakness Negative: Chest pain, sob, nausea  Physical Exam  BP (!) 174/112 (BP Location: Right Arm)   Pulse 88   Temp 97.8 F (36.6 C) (Oral)   Resp 16   Ht 5\' 5"  (1.651 m)   Wt 59.1 kg   SpO2 98%   BMI 21.68 kg/m  Gen:   Awake, no distress   Resp:  Normal effort  MSK:   Moves extremities without difficulty  Other:  Facial symmetry, symmetric grip strength, likely baseline MS  Medical Decision Making  Medically screening exam initiated at 1:46 AM.  Appropriate orders placed.  Lillah Wittmann was informed that the remainder of the evaluation will be completed by another provider, this initial triage assessment does not replace that evaluation, and the importance of remaining in the ED until their evaluation is complete.    Johaan Ryser, Corene Cornea, MD 12/08/22 [phone]

## 2022-12-08 NOTE — ED Notes (Signed)
Pt uncooperative with taking temperature

## 2022-12-08 NOTE — ED Notes (Signed)
Pt refuses In and out cath and rectal temp/ pt states she is ready to go home

## 2022-12-08 NOTE — ED Notes (Signed)
Unable to obtain lactic

## 2022-12-08 NOTE — ED Provider Notes (Signed)
[facility] Provider Note   CSN: [id number] Arrival date & time: 12/08/22  0126     History Chief Complaint  Patient presents with   Weakness   Dizziness    Tiffany Black is a 89 y.o. female.  Presents via EMS secondary to reported weakness and dizziness at home.  Patient stated feels fine now.  She is not sure while she is here.  She denies any symptoms.  She does have history of dementia.  Attempted to talk to her son who she lives with but phone went straight to voicemail couple times.  She did not complain of any abdominal pain, chest pain, back pain, headache, vision changes or weakness anywhere.   Weakness Associated symptoms: dizziness   Dizziness Associated symptoms: weakness        Home Medications Prior to Admission medications   Medication Sig Start Date End Date Taking? Authorizing Provider  cephALEXin (KEFLEX) 500 MG capsule Take 1 capsule (500 mg total) by mouth 4 (four) times daily. 12/08/22  Yes Hernando Reali, Corene Cornea, MD  diclofenac Sodium (VOLTAREN) 1 % GEL Apply 2 g topically 4 (four) times daily. To right wrist Patient taking differently: Apply 2 g topically every 6 (six) hours as needed (wrist pain). 10/02/22   Mercy Riding, MD  felodipine (PLENDIL) 10 MG 24 hr tablet Take 1 tablet (10 mg total) by mouth daily. 08/16/22   Elgergawy, Silver Huguenin, MD  FLUoxetine (PROZAC) 10 MG capsule Take 10 mg by mouth daily.    [provider]  metoprolol tartrate (LOPRESSOR) 100 MG tablet Take 1 tablet (100 mg total) by mouth 2 (two) times daily. 11/22/22   Baldwin Jamaica, PA-C  potassium chloride SA (KLOR-CON M20) 20 MEQ tablet Take 10 mEq by mouth. Every 90 days    [provider]  QUEtiapine (SEROQUEL) 50 MG tablet Take 1 tablet (50 mg total) by mouth at bedtime. 08/15/22   Elgergawy, Silver Huguenin, MD  Rivaroxaban (XARELTO) 15 MG TABS tablet Take 1 tablet (15 mg total) by mouth daily with supper. 03/06/22   Deboraha Sprang,  MD  senna-docusate (SENOKOT-S) 8.6-50 MG tablet Take 1 tablet by mouth at bedtime as needed for mild constipation. 10/02/22   Mercy Riding, MD  valsartan (DIOVAN) 320 MG tablet Take 1 tablet (320 mg total) by mouth daily. 10/02/22   Mercy Riding, MD      Allergies    Hydrochlorothiazide, Penicillins, Ciprofloxacin, Doxycycline, Hydrocodone-acetaminophen, Iodinated contrast media, Iodine-131, Lasix [furosemide], Latex, Niaspan [niacin], Statins, Carbamazepine, Codeine, Nickel, Sulfa antibiotics, Sulfamethoxazole, and Sulfonamide derivatives    Review of Systems   Review of Systems  Neurological:  Positive for dizziness and weakness.    Physical Exam Updated Vital Signs BP (!) 174/93   Pulse 88   Temp 97.8 F (36.6 C) (Oral)   Resp 18   Ht 5\' 5"  (1.651 m)   Wt 59.1 kg   SpO2 98%   BMI 21.68 kg/m  Physical Exam Vitals and nursing note reviewed.  Constitutional:      Appearance: She is well-developed.  HENT:     Head: Normocephalic and atraumatic.  Eyes:     Pupils: Pupils are equal, round, and reactive to light.  Cardiovascular:     Rate and Rhythm: Normal rate and regular rhythm.  Pulmonary:     Effort: No respiratory distress.     Breath sounds: No stridor.  Abdominal:     General: There is no distension.  Tenderness: There is no abdominal tenderness.  Musculoskeletal:     Cervical back: Normal range of motion.  Skin:    General: Skin is warm and dry.  Neurological:     Mental Status: She is alert. Mental status is at baseline. She is disoriented.     ED Results / Procedures / Treatments   Labs (all labs ordered are listed, but only abnormal results are displayed) Labs Reviewed  COMPREHENSIVE METABOLIC PANEL - Abnormal; Notable for the following components:      Result Value   Sodium 134 (*)    Chloride 97 (*)    Creatinine, Ser 1.27 (*)    Total Protein 6.3 (*)    GFR, Estimated 41 (*)    All other components within normal limits  CBC WITH  DIFFERENTIAL/PLATELET - Abnormal; Notable for the following components:   Platelets 90 (*)    All other components within normal limits  URINALYSIS, ROUTINE W REFLEX MICROSCOPIC - Abnormal; Notable for the following components:   APPearance CLOUDY (*)    Hgb urine dipstick MODERATE (*)    Protein, ur 30 (*)    Leukocytes,Ua LARGE (*)    Bacteria, UA RARE (*)    All other components within normal limits  LACTIC ACID, PLASMA  AMMONIA  CBG MONITORING, ED    EKG None  Radiology CT Head Wo Contrast  Result Date: 12/08/2022 CLINICAL DATA:  Generalized weakness and dizziness EXAM: CT HEAD WITHOUT CONTRAST TECHNIQUE: Contiguous axial images were obtained from the base of the skull through the vertex without intravenous contrast. RADIATION DOSE REDUCTION: This exam was performed according to the departmental dose-optimization program which includes automated exposure control, adjustment of the mA and/or kV according to patient size and/or use of iterative reconstruction technique. COMPARISON:  10/20/2022 FINDINGS: Brain: No evidence of acute infarction, hemorrhage, hydrocephalus, extra-axial collection or mass lesion/mass effect. Generalized brain atrophy. Chronic small vessel ischemia in the cerebral white matter that is confluent. Vascular: No hyperdense vessel or unexpected calcification. Skull: Normal. Negative for fracture or focal lesion. Sinuses/Orbits: Calcified debris in the left maxillary sinus where there is also sclerotic wall thickening, nonprogressive. IMPRESSION: Aging brain without acute or reversible finding. Electronically Signed   By: Jorje Guild M.D.   On: 12/08/2022 04:46   DG CHEST PORT 1 VIEW  Result Date: 12/08/2022 CLINICAL DATA:  Weakness and dizziness EXAM: PORTABLE CHEST 1 VIEW COMPARISON:  11/18/2022 FINDINGS: Cardiac shadow is enlarged but stable. Persistent right-sided pleural effusion is seen with underlying atelectatic changes. The left lung remains clear. No bony  abnormality is noted. IMPRESSION: Stable right-sided effusion and underlying atelectasis. Electronically Signed   By: Inez Catalina M.D.   On: 12/08/2022 02:12    Procedures Procedures    Medications Ordered in ED Medications  lactated ringers bolus 1,000 mL (has no administration in time range)  cefTRIAXone (ROCEPHIN) 2 g in sodium chloride 0.9 % 100 mL IVPB (2 g Intravenous New Bag/Given 12/08/22 0503)    ED Course/ Medical Decision Making/ A&P                             Medical Decision Making Amount and/or Complexity of Data Reviewed Labs: ordered. Radiology: ordered.  Risk Prescription drug management.   Unclear etiology why the patient came in.  She has no complaints this time.  Not able to get any corroborating information from the son.  General workup was done which did end up showing  that she has a likely urinary tract infection.  She has an allergy to penicillins but after speaking to pharmacy she has had cephalosporins in the past so initiated Rocephin and gave her prescription for Keflex at home.  Attempted to update this on the once again with straight to voicemail.  Patient stable for discharge at this time but will be pending a safe ride home which nursing can arrange.         Final Clinical Impression(s) / ED Diagnoses Final diagnoses:  Urinary tract infection without hematuria, site unspecified    Rx / DC Orders ED Discharge Orders          Ordered    cephALEXin (KEFLEX) 500 MG capsule  4 times daily        12/08/22 0512              Lexii Walsh, Corene Cornea, MD 12/08/22 2304

## 2022-12-08 NOTE — ED Triage Notes (Addendum)
Pt arrives [facility] from home where she lives with her son. EMS was called out for generalized weakness and dizziness. Pt denies both. Hx of dementia. Per EMS report, son states pt is at her baseline.

## 2022-12-11 ENCOUNTER — Emergency Department (HOSPITAL_COMMUNITY)
Admission: EM | Admit: 2022-12-11 | Discharge: 2022-12-11 | Disposition: A | Discharge status: Home / Self Care | Payer: Medicare Other | Attending: Emergency Medicine | Admitting: Emergency Medicine

## 2022-12-11 ENCOUNTER — Other Ambulatory Visit: Payer: Self-pay

## 2022-12-11 DIAGNOSIS — Z79899 Other long term (current) drug therapy: Secondary | ICD-10-CM | POA: Insufficient documentation

## 2022-12-11 DIAGNOSIS — Z7901 Long term (current) use of anticoagulants: Secondary | ICD-10-CM | POA: Insufficient documentation

## 2022-12-11 DIAGNOSIS — I10 Essential (primary) hypertension: Secondary | ICD-10-CM

## 2022-12-11 DIAGNOSIS — R531 Weakness: Secondary | ICD-10-CM

## 2022-12-11 DIAGNOSIS — F0390 Unspecified dementia without behavioral disturbance: Secondary | ICD-10-CM | POA: Diagnosis not present

## 2022-12-11 DIAGNOSIS — Z91040 Latex allergy status: Secondary | ICD-10-CM | POA: Insufficient documentation

## 2022-12-11 DIAGNOSIS — E876 Hypokalemia: Secondary | ICD-10-CM | POA: Insufficient documentation

## 2022-12-11 DIAGNOSIS — R52 Pain, unspecified: Secondary | ICD-10-CM | POA: Diagnosis not present

## 2022-12-11 LAB — BASIC METABOLIC PANEL
Anion gap: 10 (ref 5–15)
BUN: 8 mg/dL (ref 8–23)
CO2: 24 mmol/L (ref 22–32)
Calcium: 9.1 mg/dL (ref 8.9–10.3)
Chloride: 103 mmol/L (ref 98–111)
Creatinine, Ser: 1.04 mg/dL — ABNORMAL HIGH (ref 0.44–1.00)
GFR, Estimated: 52 mL/min — ABNORMAL LOW (ref >60)
Glucose, Bld: 84 mg/dL (ref 70–99)
Potassium: 3 mmol/L — ABNORMAL LOW (ref 3.5–5.1)
Sodium: 137 mmol/L (ref 135–145)

## 2022-12-11 LAB — CBC
HCT: 36.6 % (ref 36.0–46.0)
Hemoglobin: 11.7 g/dL — ABNORMAL LOW (ref 12.0–15.0)
MCH: 31 pg (ref 26.0–34.0)
MCHC: 32 g/dL (ref 30.0–36.0)
MCV: 96.8 fL (ref 80.0–100.0)
Platelets: 153 10*3/uL (ref 150–400)
RBC: 3.78 MIL/uL — ABNORMAL LOW (ref 3.87–5.11)
RDW: 13.5 % (ref 11.5–15.5)
WBC: 5 10*3/uL (ref 4.0–10.5)
nRBC: 0 % (ref 0.0–0.2)

## 2022-12-11 LAB — URINALYSIS, ROUTINE W REFLEX MICROSCOPIC
Bilirubin Urine: NEGATIVE
Glucose, UA: NEGATIVE mg/dL
Hgb urine dipstick: NEGATIVE
Ketones, ur: NEGATIVE mg/dL
Leukocytes,Ua: NEGATIVE
Nitrite: NEGATIVE
Protein, ur: NEGATIVE mg/dL
Specific Gravity, Urine: 1.008 (ref 1.005–1.030)
pH: 7 (ref 5.0–8.0)

## 2022-12-11 MED ORDER — POTASSIUM CHLORIDE CRYS ER 20 MEQ PO TBCR
40.0000 meq | EXTENDED_RELEASE_TABLET | Freq: Once | ORAL | Status: AC
  Administered 2022-12-11: 40 meq via ORAL
  Filled 2022-12-11: qty 2

## 2022-12-11 MED ORDER — POTASSIUM CHLORIDE CRYS ER 20 MEQ PO TBCR: EXTENDED_RELEASE_TABLET | ORAL | 0 refills | Status: DC

## 2022-12-11 MED ORDER — METOPROLOL TARTRATE 25 MG PO TABS
100.0000 mg | ORAL_TABLET | Freq: Once | ORAL | Status: AC
  Administered 2022-12-11: 100 mg via ORAL
  Filled 2022-12-11: qty 4

## 2022-12-11 NOTE — ED Notes (Signed)
This RN attempted to reach POA and was unable to.

## 2022-12-11 NOTE — ED Provider Notes (Signed)
Tiffany Black Provider Note   CSN: [id number] Arrival date & time: 12/11/22  [id number]     History {Add pertinent medical, surgical, social history, OB history to HPI:1} Chief Complaint  Patient presents with   Generalized Body Aches    Tiffany Black is a 89 y.o. female.  Pt with ? C/o body aches earlier. Pt currently denies any pain or other specific c/o, indicating she feels fine. Pt w hx dementia, and recent possible uti on labs, on keflex. No report of trauma/fall. Pt denies pain. No headache. No chest pain or sob. No abd pain or nv. Denies dysuria. No extremity pain or swelling.   The history is provided by the patient, the EMS personnel and medical records. The history is limited by the condition of the patient.       Home Medications Prior to Admission medications   Medication Sig Start Date End Date Taking? Authorizing Provider  cephALEXin (KEFLEX) 500 MG capsule Take 1 capsule (500 mg total) by mouth 4 (four) times daily. 12/08/22   Mesner, Corene Cornea, MD  diclofenac Sodium (VOLTAREN) 1 % GEL Apply 2 g topically 4 (four) times daily. To right wrist Patient taking differently: Apply 2 g topically every 6 (six) hours as needed (wrist pain). 10/02/22   Mercy Riding, MD  felodipine (PLENDIL) 10 MG 24 hr tablet Take 1 tablet (10 mg total) by mouth daily. 08/16/22   Elgergawy, Silver Huguenin, MD  FLUoxetine (PROZAC) 10 MG capsule Take 10 mg by mouth daily.    [provider]  metoprolol tartrate (LOPRESSOR) 100 MG tablet Take 1 tablet (100 mg total) by mouth 2 (two) times daily. 11/22/22   Baldwin Jamaica, PA-C  potassium chloride SA (KLOR-CON M20) 20 MEQ tablet Take 10 mEq by mouth. Every 90 days    [provider]  QUEtiapine (SEROQUEL) 50 MG tablet Take 1 tablet (50 mg total) by mouth at bedtime. 08/15/22   Elgergawy, Silver Huguenin, MD  Rivaroxaban (XARELTO) 15 MG TABS tablet Take 1 tablet (15 mg total) by mouth daily with supper. 03/06/22    Deboraha Sprang, MD  senna-docusate (SENOKOT-S) 8.6-50 MG tablet Take 1 tablet by mouth at bedtime as needed for mild constipation. 10/02/22   Mercy Riding, MD  valsartan (DIOVAN) 320 MG tablet Take 1 tablet (320 mg total) by mouth daily. 10/02/22   Mercy Riding, MD      Allergies    Hydrochlorothiazide, Penicillins, Ciprofloxacin, Doxycycline, Hydrocodone-acetaminophen, Iodinated contrast media, Iodine-131, Lasix [furosemide], Latex, Niaspan [niacin], Statins, Carbamazepine, Codeine, Nickel, Sulfa antibiotics, Sulfamethoxazole, and Sulfonamide derivatives    Review of Systems   Review of Systems  Constitutional:  Negative for chills and fever.  HENT:  Negative for sore throat.   Eyes:  Negative for pain, redness and visual disturbance.  Respiratory:  Negative for cough and shortness of breath.   Cardiovascular:  Negative for chest pain and leg swelling.  Gastrointestinal:  Negative for abdominal pain, diarrhea and vomiting.  Genitourinary:  Negative for dysuria and flank pain.  Musculoskeletal:  Negative for back pain and neck pain.  Skin:  Negative for rash.  Neurological:  Negative for headaches.  Hematological:  Does not bruise/bleed easily.    Physical Exam Updated Vital Signs BP (!) 166/117   Pulse 96   Temp 97.6 F (36.4 C) (Oral)   Resp (!) 22   Ht 1.651 m (5\' 5" )   Wt 59.1 kg   SpO2 97%  BMI 21.68 kg/m  Physical Exam Vitals and nursing note reviewed.  Constitutional:      Appearance: Normal appearance. She is well-developed.  HENT:     Head: Atraumatic.     Nose: Nose normal.     Mouth/Throat:     Mouth: Mucous membranes are moist.  Eyes:     General: No scleral icterus.    Conjunctiva/sclera: Conjunctivae normal.     Pupils: Pupils are equal, round, and reactive to light.  Neck:     Vascular: No carotid bruit.     Trachea: No tracheal deviation.     Comments: No stiffness or rigidity Cardiovascular:     Rate and Rhythm: Normal rate. Rhythm irregular.      Pulses: Normal pulses.     Heart sounds: Normal heart sounds. No murmur heard.    No friction rub. No gallop.  Pulmonary:     Effort: Pulmonary effort is normal. No respiratory distress.     Breath sounds: Normal breath sounds.  Abdominal:     General: Bowel sounds are normal. There is no distension.     Palpations: Abdomen is soft.     Tenderness: There is no abdominal tenderness.  Genitourinary:    Comments: No cva tenderness.  Musculoskeletal:        General: No swelling or tenderness.     Cervical back: Normal range of motion and neck supple. No rigidity. No muscular tenderness.  Skin:    General: Skin is warm and dry.     Findings: No rash.  Neurological:     Mental Status: She is alert.     Comments: Alert, speech normal. Motor/sens grossly intact bil.   Psychiatric:        Mood and Affect: Mood normal.     ED Results / Procedures / Treatments   Labs (all labs ordered are listed, but only abnormal results are displayed) Results for orders placed or performed during the hospital encounter of [id number]  Basic metabolic panel  Result Value Ref Range   Sodium 137 135 - 145 mmol/L   Potassium 3.0 (L) 3.5 - 5.1 mmol/L   Chloride 103 98 - 111 mmol/L   CO2 24 22 - 32 mmol/L   Glucose, Bld 84 70 - 99 mg/dL   BUN 8 8 - 23 mg/dL   Creatinine, Ser 1.04 (H) 0.44 - 1.00 mg/dL   Calcium 9.1 8.9 - 10.3 mg/dL   GFR, Estimated 52 (L) >60 mL/min   Anion gap 10 5 - 15  CBC  Result Value Ref Range   WBC 5.0 4.0 - 10.5 K/uL   RBC 3.78 (L) 3.87 - 5.11 MIL/uL   Hemoglobin 11.7 (L) 12.0 - 15.0 g/dL   HCT 36.6 36.0 - 46.0 %   MCV 96.8 80.0 - 100.0 fL   MCH 31.0 26.0 - 34.0 pg   MCHC 32.0 30.0 - 36.0 g/dL   RDW 13.5 11.5 - 15.5 %   Platelets 153 150 - 400 K/uL   nRBC 0.0 0.0 - 0.2 %  Urinalysis, Routine w reflex microscopic -Urine, Catheterized  Result Value Ref Range   Color, Urine STRAW (A) YELLOW   APPearance CLEAR CLEAR   Specific Gravity, Urine 1.008 1.005 - 1.030   pH  7.0 5.0 - 8.0   Glucose, UA NEGATIVE NEGATIVE mg/dL   Hgb urine dipstick NEGATIVE NEGATIVE   Bilirubin Urine NEGATIVE NEGATIVE   Ketones, ur NEGATIVE NEGATIVE mg/dL   Protein, ur NEGATIVE NEGATIVE mg/dL   Nitrite NEGATIVE  NEGATIVE   Leukocytes,Ua NEGATIVE NEGATIVE   CT Head Wo Contrast  Result Date: 12/08/2022 CLINICAL DATA:  Generalized weakness and dizziness EXAM: CT HEAD WITHOUT CONTRAST TECHNIQUE: Contiguous axial images were obtained from the base of the skull through the vertex without intravenous contrast. RADIATION DOSE REDUCTION: This exam was performed according to the departmental dose-optimization program which includes automated exposure control, adjustment of the mA and/or kV according to patient size and/or use of iterative reconstruction technique. COMPARISON:  10/20/2022 FINDINGS: Brain: No evidence of acute infarction, hemorrhage, hydrocephalus, extra-axial collection or mass lesion/mass effect. Generalized brain atrophy. Chronic small vessel ischemia in the cerebral white matter that is confluent. Vascular: No hyperdense vessel or unexpected calcification. Skull: Normal. Negative for fracture or focal lesion. Sinuses/Orbits: Calcified debris in the left maxillary sinus where there is also sclerotic wall thickening, nonprogressive. IMPRESSION: Aging brain without acute or reversible finding. Electronically Signed   By: Jorje Guild M.D.   On: 12/08/2022 04:46   DG CHEST PORT 1 VIEW  Result Date: 12/08/2022 CLINICAL DATA:  Weakness and dizziness EXAM: PORTABLE CHEST 1 VIEW COMPARISON:  11/18/2022 FINDINGS: Cardiac shadow is enlarged but stable. Persistent right-sided pleural effusion is seen with underlying atelectatic changes. The left lung remains clear. No bony abnormality is noted. IMPRESSION: Stable right-sided effusion and underlying atelectasis. Electronically Signed   By: Inez Catalina M.D.   On: 12/08/2022 02:12   DG Chest Port 1 View  Result Date: 11/18/2022 CLINICAL  DATA:  Right pleural effusion, weakness and dizziness. EXAM: PORTABLE CHEST 1 VIEW COMPARISON:  PA Lat 11/15/2022, portable chest 11/03/2022 and chest CT no contrast 09/27/2022 FINDINGS: The heart is enlarged but unchanged. No vascular congestion is seen. The mediastinum is stable with aortic tortuosity and scattered calcific plaques, mild aortic ectasia. A chronic moderate right pleural effusion continues to be seen, with overlying atelectasis. Overall aeration seems unchanged.  The lungs are otherwise clear. Degenerative change again noted spine and both shoulders. IMPRESSION: 1. No interval change. Chronic moderate right pleural effusion with overlying atelectasis. Underlying pneumonia is difficult to exclude. 2. Cardiomegaly without vascular congestion. 3. Aortic atherosclerosis and uncoiling. Electronically Signed   By: Telford Nab M.D.   On: 11/18/2022 20:05   DG Chest 2 View  Result Date: 11/15/2022 CLINICAL DATA:  89 year old female with pleural effusion EXAM: CHEST - 2 VIEW COMPARISON:  11/03/2022 FINDINGS: Cardiomediastinal silhouette unchanged. Increasing opacity at the right lung base with obscuration of the right hemidiaphragm and the right heart border. Thickening of the minor fissure. Interval improved airspace in the right upper lobe. No new left-sided airspace disease. No pneumothorax. Meniscus on the lateral view. Degenerative changes the spine.  No displaced fracture IMPRESSION: Increasing opacity at the right lung base, likely a combination of pleural effusion and associated atelectasis/consolidation. Interval clearing of airspace disease in the right upper lobe Electronically Signed   By: Corrie Mckusick D.O.   On: 11/15/2022 15:35     EKG None  Radiology No results found.  Procedures Procedures  {Document cardiac monitor, telemetry assessment procedure when appropriate:1}  Medications Ordered in ED Medications  potassium chloride SA (KLOR-CON M) CR tablet 40 mEq (has no  administration in time range)    ED Course/ Medical Decision Making/ A&P   {   Click here for ABCD2, HEART and other calculatorsREFRESH Note before signing :1}                          Medical Decision  Making Amount and/or Complexity of Data Reviewed Labs: ordered.  Risk Prescription drug management.   Iv ns. Continuous pulse ox and cardiac monitoring. Labs ordered/sent.   Differential diagnosis includes  . Dispo decision including potential need for admission considered - will get labs  and reassess.   Reviewed nursing notes and prior charts for additional history. External reports reviewed. Additional history from:  Cardiac monitor: sinus rhythm, rate 88.  Labs reviewed/interpreted by me - k low. Kcl po. Po fluids, food. Ua neg for uti.   Recent Xrays reviewed/interpreted by me - no pna.  Recent CT reviewed/interpreted by me - no hem.   Hx htn, bp elev, will give dose of her med. Metoprolol po.  Recheck, pt asymptomatic.   Pt currently appears stable for d/c.   Rec pcp f/u, incl for htn.  Return precautions provided.        {Document critical care time when appropriate:1} {Document review of labs and clinical decision tools ie heart score, Chads2Vasc2 etc:1}  {Document your independent review of radiology images, and any outside records:1} {Document your discussion with family members, caretakers, and with consultants:1} {Document social determinants of health affecting pt's care:1} {Document your decision making why or why not admission, treatments were needed:1} Final Clinical Impression(s) / ED Diagnoses Final diagnoses:  None    Rx / DC Orders ED Discharge Orders     None

## 2022-12-11 NOTE — Discharge Instructions (Addendum)
It was our pleasure to provide your ER care today - we hope that you feel better.  From today's labs, your potassium level is low (3) - eat plenty of fruits and vegetables, take potassium supplement as prescribed, and follow up with your doctor in one week.   Your blood pressure is high - continue your meds, follow heart health meal plan, and follow up closely with your doctor this coming week.  Return to ER if worse, new symptoms, fevers, new/severe pain, chest pain, trouble breathing, or other emergency concern.

## 2022-12-11 NOTE — ED Notes (Signed)
This RN reviewed discharge instructions with patient's son whom is also her POA. He verbalized understanding and denied any further questions. Gerald Stabs will pick pt up at around noon.

## 2022-12-11 NOTE — ED Notes (Signed)
Walked patient to the bathroom patient did well patient is now back in bed on the monitor with call bell in reach  

## 2022-12-11 NOTE — ED Notes (Signed)
Pt ambulated to bathroom with stable gait.

## 2022-12-11 NOTE — ED Triage Notes (Addendum)
PT BIB EMS from home for generalize body pain. PT denies pain at this time. PT taking antibiotic regimen for recent diagnosis of UTI and family believes "it is not working". PT has HX of afib, dementia, HTN and CVA. A&Ox2-time - purpose. NAD noted at this time.   EMS VS 170/100 95% RA 105 HR 16 RR

## 2022-12-18 ENCOUNTER — Ambulatory Visit (INDEPENDENT_AMBULATORY_CARE_PROVIDER_SITE_OTHER): Payer: Medicare Other | Admitting: Neurology

## 2022-12-18 ENCOUNTER — Encounter: Payer: Self-pay | Admitting: Neurology

## 2022-12-18 VITALS — BP 137/86 | HR 81 | Ht 65.0 in | Wt 132.0 lb

## 2022-12-18 DIAGNOSIS — Z9181 History of falling: Secondary | ICD-10-CM | POA: Diagnosis not present

## 2022-12-18 DIAGNOSIS — F0150 Vascular dementia without behavioral disturbance: Secondary | ICD-10-CM

## 2022-12-18 DIAGNOSIS — Z9289 Personal history of other medical treatment: Secondary | ICD-10-CM

## 2022-12-18 NOTE — Patient Instructions (Signed)
You have multiple risk factors for vascular dementia.  I recommend supportive care and 24-7 hour supervision and continuing medical management of your chronic conditions.  Fall risk is real! Please remember to stand up slowly and get your bearings first turn slowly, no bending down to pick anything, no heavy lifting, be extra careful at night and first thing in the morning. Also, be careful in the Bathroom and the kitchen. Use your walker at all times, as you have fallen.

## 2022-12-18 NOTE — Progress Notes (Signed)
Subjective:    Patient ID: Tiffany Black is a 89 y.o. female.  HPI    Huston Foley, MD, PhD [facility] [address] [address] [place], [place] [address]  Dear Dr. Clelia Croft,  I saw your patient, Tiffany Black, upon your kind request in my neurologic clinic today for initial consultation of her memory loss.  The patient is accompanied by her youngest son, Tiffany Black [facility]), today.  As you know, Ms. Fouty is an 89 year old female with an underlying complex medical history of stroke, hypertension, hyperlipidemia, irritable bowel syndrome, lung nodule, osteoarthritis, A-fib, history of pneumonia, history of vitamin D deficiency, history of vertigo, arthritis, BPPV, cardiomegaly, chronic neck pain, and anxiety, who reports forgetting things but believes this is normal for her age.  She is not able to provide details of her history and details are provided exclusively by her son.  She has had memory loss for at least 2 years, she has not been on any medication for memory loss.  She lives at home, he lives with her.  She has called EMS recently multiple times.  She has fallen at home, she uses a 2 wheeled walker.  She also has a wheelchair at home and per son, she often scoots around in her wheelchair as she is fearful of falling.  She has a chronic or recurrent pleural effusion, history of rib fractures.  She is currently not on Xarelto.  She was started on Seroquel during one of her hospitalizations as I understand but is currently not taking it.  She takes Tylenol PM to help her sleep at night.  Her appetite is fairly good, she tries to hydrate per son, she likes to drink soda, up to 4/day but also drinks Gatorade and water.  She is a non-smoker and does not drink any alcohol.  She has not driven a car since her fall around Thanksgiving 2023.  She has 2 older sons as well. She requires 24-7 supervision, she has currently home health physical therapy which is  helping.  I reviewed your office note from 11/14/2022.  Of note, she has had multiple emergency room visits and hospitalizations in the recent few months.  She presented to the emergency room on 07/08/2022 with dizziness and lightheadedness.  She was hospitalized in late November due to transient speech disturbance.  She had a hospitalization in December.  In January she presented to the emergency room with A-fib.  She had a hospitalization in January 2024 with pneumonia.  She presented to the emergency room in late January 2024 after a fall.  She was hospitalized in February 2024 with pleural effusion and after a fall.  She was hospitalized in February 2024 with pleural effusion.  She presented to the emergency room in early March 2024 with lightheadedness and persistent A-fib, recurrent pleural effusion.  She presented to the emergency room in March again with dizziness.  She presented to the emergency room in late March 2024 with a UTI.  She presented to the emergency room in early April 2024 with generalized weakness.  She had a head CT without contrast through the emergency room at [facility] on 12/08/2022 and I reviewed the results: Impression: Aging brain without acute or reversible finding.  She had a head CT without contrast and cervical spine CT without contrast in the hospital on 10/20/2022 after a fall, I reviewed the results: Impression: No acute intracranial process.  Atrophy and chronic small vessel ischemic disease.  IMPRESSION: 1. No acute  displaced fracture or traumatic listhesis of the cervical spine. 2. At least moderate volume right pleural effusion. Please see separately dictated chest x-ray 10/20/2022. 3. Right brachiocephalic artery measures at the upper limits of normal (1.6 cm). 4. Aortic Atherosclerosis (ICD10-I70.0).   She has had multiple head CT scan since October 2023.  She had a brain MRI without contrast on 08/10/2022 and I reviewed the  results: IMPRESSION: Limitations: Susceptibility weighted imaging was not performed due to patient intolerance.   1. No acute intracranial abnormality. 2. Sequela of severe chronic microvascular ischemic change with chronic infarct in the left corona radiata, progressed compared to 2016. 3. Advanced generalized volume loss, progressed compared to 2016. Volume loss likely has mild frontal and temporal lobe predominance.  She had seen Dr. Anne Hahn in this office on 07/02/2017 for a longstanding history of headaches.  I reviewed the office visit note and copied the note below for reference.  She was in a skilled nursing facility and discharged to home in March 2024.  She has a family history of dementia affecting a sister.  Mom also had dementia and lived to be 82 years old.  She is widowed and has 3 sons.  Previously:  07/02/2017: << Ms. Dilmore is an 45 year old right-handed white female with a 50 year history of headaches. The patient indicates that she began having headaches when she was in her 30s. She claims that the headaches always tend to start in the left occipital area associated with a dull achy pain that may go into the left ear and then radiate into the left frontotemporal area and around the left eye. The patient does not get photophobia or phonophobia with headache, she reports no nausea or vomiting or visual disturbances. The headache may last all day until she puts BenGay on the left occipital area and she is able to get to sleep. If she can get to sleep, when she wakes up the headache is completely gone. The patient denies any other associated symptoms of numbness, weakness, speech disturbance or problems with swallowing. She does have some left sided neck pain at times. She denies any balance problems or difficulty controlling the bowels or bladder. She denies any family history of headache. The headaches will tend to occur once every 2 or 4 weeks. The patient has undergone a CT scan  of brain in 2016, MRI of the brain also was done in 2016 and she has had a cervical spine MRI done on 12/03/2014 by Dr. Danielle Dess. She underwent an epidural steroid injection in 2016, this did not help her headaches. The patient claims that the onset of the headache 50 years ago was preceded by an electrical sound in her left ear unassociated with any pain. This has not recurred.  >>  Her Past Medical History Is Significant For: Past Medical History:  Diagnosis Date   Anxiety    Arthritis    BPPV (benign paroxysmal positional vertigo)    Breast lesion    left   Bulging of cervical intervertebral disc    Cardiomegaly    Cataracts, bilateral    Chronic neck pain    CVA (cerebral vascular accident)    x2 2013 and 2015; right temporal lobe and left frontal lobe infarct-> dzziness which resolved; occluded right PCA, moderate posterior left cerebral stenosis, left MCA stenosis on MRA   Dementia    Essential hypertension    History of endometrial cancer    History of stroke    2015 and 2016 // carotid  US 8/16: Bilateral 1-39 // carotid US 2/16: Bilateral ICA 1-39   HLD (hyperlipidemia)    Hx of echocardiogram    a. Echo (11/15): Mild LVH, EF 55-60%, normal wall motion, grade 2 diastolic dysfunction, mild LAE, normal RV function, PASP 29 mm Hg // echo 8/16:Mild concentric LVH, EF 55-60, normal wall motion, grade 2 diastolic dysfunction, mild LAE, PASP 39   Hypertension    IBS (irritable bowel syndrome)    Insomnia    Lung nodule    OA (osteoarthritis)    Pectus excavatum    Persistent atrial fibrillation    Pleural effusion    right recurrent, 1/24 s/p thoracentesis x 3 (cytology negative)- Dr Francine Gravenewald   Pneumonia due to COVID-19 virus    Radius fracture    left distal   Vertigo    Vitamin D deficiency     Her Past Surgical History Is Significant For: Past Surgical History:  Procedure Laterality Date   ABDOMINAL HYSTERECTOMY  2011   CATARACT EXTRACTION, BILATERAL Bilateral 2009    CESAREAN SECTION  1975   IR THORACENTESIS ASP PLEURAL SPACE W/IMG GUIDE  09/28/2022   IR THORACENTESIS ASP PLEURAL SPACE W/IMG GUIDE  10/23/2022   LAPAROSCOPIC HYSTERECTOMY  2011   THORACENTESIS Right 11/03/2022   Procedure: THORACENTESIS;  Surgeon: Omar PersonMeier, Nathaniel M, MD;  [facility];  Service: Pulmonary;  Laterality: Right;    Her Family History Is Significant For: Family History  Problem Relation Age of Onset   Stroke Mother    Heart attack Father    Heart disease Father    Dementia Sister    CAD Brother    Stroke Maternal Uncle    Hypertension Neg Hx     Her Social History Is Significant For: Social History   Socioeconomic History   Marital status: Widowed    Spouse name: Not on file   Number of children: 3   Years of education: 2312   Highest education level: Not on file  Occupational History   Occupation: RetiredTherapist, nutritional- secretary  Tobacco Use   Smoking status: Never   Smokeless tobacco: Never  Vaping Use   Vaping Use: Never used  Substance and Sexual Activity   Alcohol use: No   Drug use: No   Sexual activity: Never  Other Topics Concern   Not on file  Social History Narrative   Diet: N/A   Caffeine: N/A   Married, if yes what year: Widow, married 1956   Do you live in a house, apartment, assisted living, condo, trailer, ect: House, one stories, 2 persons   Pets: None   Highest level of education: 12th Grade    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 No   Do you have difficulty preparing food or eating? No    Do you have difficulty managing your medications? No   Do you have difficulty managing your finances? No    Do you have difficulty affording your medications? Yes   Social Determinants of Health   Financial Resource Strain: Not on file  Food Insecurity: No Food Insecurity (10/22/2022)    Hunger Vital Sign    Worried About Running Out of Food in the Last Year: Never true    Ran Out of Food in the Last Year: Never true  Transportation Needs: No Transportation Needs (  10/22/2022)   PRAPARE - Administrator, Civil Service (Medical): No    Lack of Transportation (Non-Medical): No  Physical Activity: Not on file  Stress: Not on file  Social Connections: Not on file    Her Allergies Are:  Allergies  Allergen Reactions   Hydrochlorothiazide Hives   Penicillins Hives    Has patient had a PCN reaction causing immediate rash, facial/tongue/throat swelling, SOB or lightheadedness with hypotension: Yes Has patient had a PCN reaction causing severe rash involving mucus membranes or skin necrosis: No Has patient had a PCN reaction that required hospitalization No Has patient had a PCN reaction occurring within the last 10 years: No If all of the above answers are "NO", then may proceed with Cephalosporin use.    Ciprofloxacin Other (See Comments) and Hypertension    Blood pressure issues- patient doesn't recall if it increased OR decreased it     Doxycycline Diarrhea and Nausea And Vomiting   Hydrocodone-Acetaminophen Other (See Comments)    Unknown reaction   Iodinated Contrast Media Diarrhea, Other (See Comments) and Hypertension    Elevated the B/P   Iodine-131 Other (See Comments) and Hypertension    Raised the blood pressure   Lasix [Furosemide] Other (See Comments)    No reaction listed on order summary report from [facility]   Latex Itching   Niaspan [Niacin]     Flu (from [facility] notes)   Statins Other (See Comments)    Muscle weakness   Carbamazepine Other (See Comments) and Rash    Flushed blood pressure medication out of system   Codeine Nausea And Vomiting   Nickel Rash   Sulfa Antibiotics Hives and Rash   Sulfamethoxazole Hives and Rash   Sulfonamide Derivatives Hives and Rash       :   Her Current Medications Are:  Outpatient Encounter Medications as  of 12/18/2022  Medication Sig   felodipine (PLENDIL) 10 MG 24 hr tablet Take 1 tablet (10 mg total) by mouth daily.   FLUoxetine (PROZAC) 10 MG capsule Take 10 mg by mouth daily.   metoprolol tartrate (LOPRESSOR) 100 MG tablet Take 1 tablet (100 mg total) by mouth 2 (two) times daily.   potassium chloride SA (KLOR-CON M) 20 MEQ tablet One po bid x 3 days, then take one po once a day   potassium chloride SA (KLOR-CON M20) 20 MEQ tablet Take 10 mEq by mouth. Every 90 days   senna-docusate (SENOKOT-S) 8.6-50 MG tablet Take 1 tablet by mouth at bedtime as needed for mild constipation.   valsartan (DIOVAN) 320 MG tablet Take 1 tablet (320 mg total) by mouth daily.   cephALEXin (KEFLEX) 500 MG capsule Take 1 capsule (500 mg total) by mouth 4 (four) times daily.   diclofenac Sodium (VOLTAREN) 1 % GEL Apply 2 g topically 4 (four) times daily. To right wrist (Patient not taking: Reported on 12/18/2022)   QUEtiapine (SEROQUEL) 50 MG tablet Take 1 tablet (50 mg total) by mouth at bedtime. (Patient not taking: Reported on 12/18/2022)   Rivaroxaban (XARELTO) 15 MG TABS tablet Take 1 tablet (15 mg total) by mouth daily with supper. (Patient not taking: Reported on 12/18/2022)   No facility-administered encounter medications on file as of 12/18/2022.  :   Review of Systems:  Out of a complete 14 point review of systems, all are reviewed and negative with the exception of these symptoms as listed below:   Review of Systems  Neurological:  Pt here for memory loss  Son states short term memory is worse  Son states pt forgets persons,times,and places  Son states pt had 1 fall Nov 2023 hospital stay required Pt had Covid Dec 2023 Pt had Pneumonia Jan 2024  Pt has broken ribs     Objective:  Neurological Exam  Physical Exam Physical Examination:   Vitals:   12/18/22 1357  BP: 137/86  Pulse: 81    General Examination: The patient is a very pleasant 89 y.o. female in no acute distress. She appears frail,  she is well-groomed.  She is cooperative with the exam.  She is unable to provide her history.   HEENT: Normocephalic, atraumatic, pupils are equal, round and reactive to light, tracking is fairly well-preserved, face is symmetric with normal facial animation, speech is clear without dysarthria or hypophonia.  Airway examination reveals mild to moderate mouth dryness.  Tongue protrudes centrally and palate elevates symmetrically, no carotid bruits.  Hearing is grossly intact.    Chest: Clear to auscultation without wheezing, rhonchi or crackles noted.  Heart: S1+S2+0, slightly irregular.     Abdomen: Soft, non-tender and non-distended.  Extremities: There is no pitting edema in the distal lower extremities bilaterally.   Skin: Warm and dry without trophic changes noted.   Musculoskeletal: exam reveals no obvious joint deformities, with the exception of arthritic changes in her hands.   Neurologically:  Mental status: The patient is awake, paying attention, unable to provide details of her history.   Mood is normal and affect is normal.     12/18/2022    1:59 PM 12/19/2016    1:34 PM  MMSE - Mini Mental State Exam  Orientation to time 0 5  Orientation to Place 3 5  Registration 3 3  Attention/ Calculation 1 5  Recall 0 2  Language- name 2 objects 2 2  Language- repeat 1 1  Language- follow 3 step command 3 3  Language- read & follow direction 1 1  Write a sentence 1 1  Copy design 1 1  Total score 16 29    On 12/18/2022: CDT: 3/4, AFT: 2/min.  Cranial nerves II - XII are as described above under HEENT exam.  Motor exam: Thin bulk, global strength of 4 out of 5, reflexes 1+ in the upper extremities and trace in the lower extremities.   Fine motor skills and coordination: grossly intact.  Cerebellar testing: No dysmetria or intention tremor. There is no truncal or gait ataxia.  Sensory exam: intact to light touch in the upper and lower extremities.  Gait, station and balance: She  stands with mild difficulty and pushes herself up, she walks with a 2 wheeled walker, requires no assistance while walking, no significant shuffling noted.  Assessment and plan:  In summary, Shacara Cozine is a very pleasant 89 y.o.-year old female with an underlying complex medical history of stroke, hypertension, hyperlipidemia, irritable bowel syndrome, lung nodule, osteoarthritis, A-fib, history of pneumonia, history of vitamin D deficiency, history of vertigo, arthritis, BPPV, cardiomegaly, chronic neck pain, and anxiety, who presents for evaluation of her memory loss of at least 2 years duration with progression noted per son.  She does have multiple vascular risk factors.  She is at risk for vascular dementia, memory scores are abnormal but need to be interpreted in the context of recent multiple hospitalizations, ER visits, acute illnesses, such as pleural effusion, UTI, electrolyte disturbances etc.  She is currently home with 24-7 supervision.  I had a lengthy  discussion with the patient and particularly her son.  We mutually agreed that supportive care and taking care of her chronic medical conditions and optimizing these is key.  I recommend regular checkup with her current specialist and your office, she is advised to stay well-hydrated and well rested and we talked about the importance of fall prevention, she is advised to use her walker at all times.  I recommend she no longer drive and have 24-2 supervision.  I would like to see her medically more stable before any new medication especially dementia medication can be considered.  She does not need a scheduled follow-up appointment in this office but I would be happy to see her on an as-needed basis.  I answered all their questions today and the patient and her son were in agreement.  This was an extended visit of over 60 minutes with extended chart review involved.   Thank you very much for allowing me to participate in the care of this nice  patient. If I can be of any further assistance to you please do not hesitate to call me at [phone].  Sincerely,   Huston Foley, MD, PhD

## 2022-12-26 ENCOUNTER — Encounter (HOSPITAL_COMMUNITY): Payer: Self-pay | Admitting: *Deleted

## 2022-12-26 ENCOUNTER — Emergency Department (HOSPITAL_COMMUNITY)
Admission: EM | Admit: 2022-12-26 | Discharge: 2022-12-26 | Disposition: A | Discharge status: Home / Self Care | Payer: Medicare Other | Attending: Emergency Medicine | Admitting: Emergency Medicine

## 2022-12-26 ENCOUNTER — Emergency Department (HOSPITAL_COMMUNITY): Payer: Medicare Other

## 2022-12-26 ENCOUNTER — Other Ambulatory Visit: Payer: Self-pay

## 2022-12-26 DIAGNOSIS — Z91040 Latex allergy status: Secondary | ICD-10-CM | POA: Diagnosis not present

## 2022-12-26 DIAGNOSIS — Z8542 Personal history of malignant neoplasm of other parts of uterus: Secondary | ICD-10-CM | POA: Insufficient documentation

## 2022-12-26 DIAGNOSIS — R5381 Other malaise: Secondary | ICD-10-CM | POA: Diagnosis present

## 2022-12-26 DIAGNOSIS — I10 Essential (primary) hypertension: Secondary | ICD-10-CM | POA: Diagnosis not present

## 2022-12-26 DIAGNOSIS — F0390 Unspecified dementia without behavioral disturbance: Secondary | ICD-10-CM | POA: Diagnosis not present

## 2022-12-26 LAB — URINALYSIS, W/ REFLEX TO CULTURE (INFECTION SUSPECTED)
Bacteria, UA: NONE SEEN
Bilirubin Urine: NEGATIVE
Glucose, UA: NEGATIVE mg/dL
Hgb urine dipstick: NEGATIVE
Ketones, ur: NEGATIVE mg/dL
Leukocytes,Ua: NEGATIVE
Nitrite: NEGATIVE
Protein, ur: NEGATIVE mg/dL
Specific Gravity, Urine: 1.008 (ref 1.005–1.030)
pH: 7 (ref 5.0–8.0)

## 2022-12-26 LAB — COMPREHENSIVE METABOLIC PANEL
ALT: 16 U/L (ref 0–44)
AST: 25 U/L (ref 15–41)
Albumin: 3.5 g/dL (ref 3.5–5.0)
Alkaline Phosphatase: 62 U/L (ref 38–126)
Anion gap: 9 (ref 5–15)
BUN: 13 mg/dL (ref 8–23)
CO2: 24 mmol/L (ref 22–32)
Calcium: 8.9 mg/dL (ref 8.9–10.3)
Chloride: 103 mmol/L (ref 98–111)
Creatinine, Ser: 1.03 mg/dL — ABNORMAL HIGH (ref 0.44–1.00)
GFR, Estimated: 53 mL/min — ABNORMAL LOW (ref >60)
Glucose, Bld: 96 mg/dL (ref 70–99)
Potassium: 3.1 mmol/L — ABNORMAL LOW (ref 3.5–5.1)
Sodium: 136 mmol/L (ref 135–145)
Total Bilirubin: 0.9 mg/dL (ref 0.3–1.2)
Total Protein: 6.2 g/dL — ABNORMAL LOW (ref 6.5–8.1)

## 2022-12-26 LAB — CBC WITH DIFFERENTIAL/PLATELET
Abs Immature Granulocytes: 0.01 10*3/uL (ref 0.00–0.07)
Basophils Absolute: 0 10*3/uL (ref 0.0–0.1)
Basophils Relative: 1 %
Eosinophils Absolute: 0.4 10*3/uL (ref 0.0–0.5)
Eosinophils Relative: 7 %
HCT: 34.9 % — ABNORMAL LOW (ref 36.0–46.0)
Hemoglobin: 11.5 g/dL — ABNORMAL LOW (ref 12.0–15.0)
Immature Granulocytes: 0 %
Lymphocytes Relative: 24 %
Lymphs Abs: 1.2 10*3/uL (ref 0.7–4.0)
MCH: 30.5 pg (ref 26.0–34.0)
MCHC: 33 g/dL (ref 30.0–36.0)
MCV: 92.6 fL (ref 80.0–100.0)
Monocytes Absolute: 0.5 10*3/uL (ref 0.1–1.0)
Monocytes Relative: 10 %
Neutro Abs: 3 10*3/uL (ref 1.7–7.7)
Neutrophils Relative %: 58 %
Platelets: 177 10*3/uL (ref 150–400)
RBC: 3.77 MIL/uL — ABNORMAL LOW (ref 3.87–5.11)
RDW: 13.6 % (ref 11.5–15.5)
WBC: 5.1 10*3/uL (ref 4.0–10.5)
nRBC: 0 % (ref 0.0–0.2)

## 2022-12-26 LAB — TROPONIN I (HIGH SENSITIVITY): Troponin I (High Sensitivity): 12 ng/L (ref <18)

## 2022-12-26 MED ORDER — IRBESARTAN 300 MG PO TABS
300.0000 mg | ORAL_TABLET | Freq: Once | ORAL | Status: AC
  Administered 2022-12-26: 300 mg via ORAL
  Filled 2022-12-26: qty 1

## 2022-12-26 MED ORDER — METOPROLOL TARTRATE 25 MG PO TABS
100.0000 mg | ORAL_TABLET | Freq: Once | ORAL | Status: AC
  Administered 2022-12-26: 100 mg via ORAL
  Filled 2022-12-26: qty 4

## 2022-12-26 MED ORDER — FELODIPINE ER 5 MG PO TB24
10.0000 mg | ORAL_TABLET | Freq: Every day | ORAL | Status: DC
  Administered 2022-12-26: 10 mg via ORAL
  Filled 2022-12-26: qty 2

## 2022-12-26 NOTE — ED Notes (Signed)
Son will pick up at time of d/c

## 2022-12-26 NOTE — ED Notes (Signed)
RN called son, message left

## 2022-12-26 NOTE — ED Notes (Signed)
Alert, NAD, calm, interactive. Eating. BP meds given.

## 2022-12-26 NOTE — ED Notes (Signed)
EDP at BS 

## 2022-12-26 NOTE — ED Notes (Signed)
Xray at BS 

## 2022-12-26 NOTE — ED Notes (Signed)
Pt ambulated to the restroom per pt choice but returned to the room in a wheelchair due to her being weak.

## 2022-12-26 NOTE — ED Notes (Signed)
Confused. States, "My car is outside and I need to drive it home when my son gets here". Patient reminded she came in via EMS. Assisted to bathroom via wheel chair and back to stretcher. Side rails up. Call bell in reach. In view of nurses station for safety.

## 2022-12-26 NOTE — ED Notes (Signed)
Resting, NAD, calm, VSS.

## 2022-12-26 NOTE — ED Notes (Signed)
Spoke with son, "on his way, expecting to arrive around 1315".

## 2022-12-26 NOTE — ED Notes (Signed)
Son called back, updated. Speaking with mother via phone. BP improved. No changes. Alert, NAD, calm, interactive.

## 2022-12-26 NOTE — Discharge Instructions (Signed)
Your workup was reassuring today.  Follow-up with Dr. Clelia Croft.

## 2022-12-26 NOTE — ED Triage Notes (Signed)
BIB [facility] from home, lives with son, has been up all night, states, "not feeling well" since 0100, with AMS, and increased restlessness, and agitation. No meds given today. H/o afib, but has not had xarelto for ~ 1 month d/t R lower pleural effusion. H/o UTI although rare. States "might be going more frequently". Alert, NAD, calm, pleasant, interactive, speaking in clear complete sentences, resps e/u, A&O x3, not time.

## 2022-12-26 NOTE — Progress Notes (Signed)
Visited with patient to provide emotional and spiritual support. Pt said she was hungry .  I made her Nurse aware. Her Doctor said it was ok to give her food. Chaplain will follow as needed.   Venida Jarvis, [name], [facility], Pager [phone]

## 2022-12-26 NOTE — ED Provider Notes (Signed)
Tiffany Black Provider Note   CSN: [id number] Arrival date & time: 12/26/22  4098     History  Chief Complaint  Patient presents with   Altered Mental Status    Tiffany Black is a 89 y.o. female.   Altered Mental Status Patient brought and feeling bad.  States just not feeling well.  States she did not have her medicine this morning.  States she just feels a little off.  No chest pain.  Does have history of dementia.  States she just feels off.      Past Medical History:  Diagnosis Date   Anxiety    Arthritis    BPPV (benign paroxysmal positional vertigo)    Breast lesion    left   Bulging of cervical intervertebral disc    Cardiomegaly    Cataracts, bilateral    Chronic neck pain    CVA (cerebral vascular accident)    x2 2013 and 2015; right temporal lobe and left frontal lobe infarct-> dzziness which resolved; occluded right PCA, moderate posterior left cerebral stenosis, left MCA stenosis on MRA   Dementia    Essential hypertension    History of endometrial cancer    History of stroke    2015 and 2016 // carotid US 8/16: Bilateral 1-39 // carotid US 2/16: Bilateral ICA 1-39   HLD (hyperlipidemia)    Hx of echocardiogram    a. Echo (11/15): Mild LVH, EF 55-60%, normal wall motion, grade 2 diastolic dysfunction, mild LAE, normal RV function, PASP 29 mm Hg // echo 8/16:Mild concentric LVH, EF 55-60, normal wall motion, grade 2 diastolic dysfunction, mild LAE, PASP 39   Hypertension    IBS (irritable bowel syndrome)    Insomnia    Lung nodule    OA (osteoarthritis)    Pectus excavatum    Persistent atrial fibrillation    Pleural effusion    right recurrent, 1/24 s/p thoracentesis x 3 (cytology negative)- Dr Francine Graven   Pneumonia due to COVID-19 virus    Radius fracture    left distal   Vertigo    Vitamin D deficiency     Home Medications Prior to Admission medications   Medication Sig Start Date End Date Taking?  Authorizing Provider  cephALEXin (KEFLEX) 500 MG capsule Take 1 capsule (500 mg total) by mouth 4 (four) times daily. 12/08/22   Mesner, Barbara Cower, MD  diclofenac Sodium (VOLTAREN) 1 % GEL Apply 2 g topically 4 (four) times daily. To right wrist Patient not taking: Reported on 12/18/2022 10/02/22   Almon Hercules, MD  felodipine (PLENDIL) 10 MG 24 hr tablet Take 1 tablet (10 mg total) by mouth daily. 08/16/22   Elgergawy, Leana Roe, MD  FLUoxetine (PROZAC) 10 MG capsule Take 10 mg by mouth daily.    [provider]  metoprolol tartrate (LOPRESSOR) 100 MG tablet Take 1 tablet (100 mg total) by mouth 2 (two) times daily. 11/22/22   Sheilah Pigeon, PA-C  potassium chloride SA (KLOR-CON M) 20 MEQ tablet One po bid x 3 days, then take one po once a day 12/11/22   Cathren Laine, MD  potassium chloride SA (KLOR-CON M20) 20 MEQ tablet Take 10 mEq by mouth. Every 90 days    [provider]  QUEtiapine (SEROQUEL) 50 MG tablet Take 1 tablet (50 mg total) by mouth at bedtime. Patient not taking: Reported on 12/18/2022 08/15/22   Elgergawy, Leana Roe, MD  Rivaroxaban (XARELTO) 15 MG TABS tablet Take  1 tablet (15 mg total) by mouth daily with supper. Patient not taking: Reported on 12/18/2022 03/06/22   Duke Salvia, MD  senna-docusate (SENOKOT-S) 8.6-50 MG tablet Take 1 tablet by mouth at bedtime as needed for mild constipation. 10/02/22   Almon Hercules, MD  valsartan (DIOVAN) 320 MG tablet Take 1 tablet (320 mg total) by mouth daily. 10/02/22   Almon Hercules, MD      Allergies    Hydrochlorothiazide, Penicillins, Ciprofloxacin, Doxycycline, Hydrocodone-acetaminophen, Iodinated contrast media, Iodine-131, Lasix [furosemide], Latex, Niaspan [niacin], Statins, Carbamazepine, Codeine, Nickel, Sulfa antibiotics, Sulfamethoxazole, and Sulfonamide derivatives    Review of Systems   Review of Systems  Physical Exam Updated Vital Signs BP (!) 175/104   Pulse 91   Temp 97.9 F (36.6 C)   Resp (!) 25   Wt  59.9 kg   SpO2 94%   BMI 21.97 kg/m  Physical Exam Vitals and nursing note reviewed.  HENT:     Head: Atraumatic.  Eyes:     Pupils: Pupils are equal, round, and reactive to light.  Chest:     Chest wall: No tenderness.  Abdominal:     Tenderness: There is no abdominal tenderness.  Musculoskeletal:        General: No swelling or tenderness.     Cervical back: Neck supple.  Neurological:     Mental Status: She is alert. Mental status is at baseline.     ED Results / Procedures / Treatments   Labs (all labs ordered are listed, but only abnormal results are displayed) Labs Reviewed  COMPREHENSIVE METABOLIC PANEL - Abnormal; Notable for the following components:      Result Value   Potassium 3.1 (*)    Creatinine, Ser 1.03 (*)    Total Protein 6.2 (*)    GFR, Estimated 53 (*)    All other components within normal limits  URINALYSIS, W/ REFLEX TO CULTURE (INFECTION SUSPECTED) - Abnormal; Notable for the following components:   Color, Urine STRAW (*)    All other components within normal limits  CBC WITH DIFFERENTIAL/PLATELET - Abnormal; Notable for the following components:   RBC 3.77 (*)    Hemoglobin 11.5 (*)    HCT 34.9 (*)    All other components within normal limits  TROPONIN I (HIGH SENSITIVITY)    EKG EKG Interpretation  Date/Time:  Tuesday December 26 2022 08:28:08 EDT Ventricular Rate:  74 PR Interval:    QRS Duration: 99 QT Interval:  372 QTC Calculation: 413 R Axis:   -53 Text Interpretation: Atrial fibrillation Left anterior fascicular block Abnormal R-wave progression, late transition Repol abnrm suggests ischemia, lateral leads Confirmed by Benjiman Core [phone]) on 12/26/2022 12:47:15 PM  Radiology DG Chest Portable 1 View  Result Date: 12/26/2022 CLINICAL DATA:  Weakness. EXAM: PORTABLE CHEST 1 VIEW COMPARISON:  Chest x-ray dated December 08, 2022. FINDINGS: Unchanged cardiomegaly. Normal pulmonary vascularity. Unchanged small to moderate right  pleural effusion with right basilar atelectasis. The left lung is clear. No pneumothorax. No acute osseous abnormality. IMPRESSION: 1. Unchanged small to moderate right pleural effusion. Electronically Signed   By: Obie Dredge M.D.   On: 12/26/2022 08:59    Procedures Procedures    Medications Ordered in ED Medications  felodipine (PLENDIL) 24 hr tablet 10 mg (10 mg Oral Given 12/26/22 1049)  metoprolol tartrate (LOPRESSOR) tablet 100 mg (100 mg Oral Given 12/26/22 1048)  irbesartan (AVAPRO) tablet 300 mg (300 mg Oral Given 12/26/22 1048)    ED Course/  Medical Decision Making/ A&P                             Medical Decision Making Amount and/or Complexity of Data Reviewed Labs: ordered. Radiology: ordered.  Risk Prescription drug management.   Patient presents with feeling off.  Has had previous visits for same.  Workup reassuring.  Differential diagnose includes infection, dehydration, medication reaction.  Initially hypertensive.  Blood pressure improved some after treatment.  Had not taken her medicines this morning.  Discussed with the patient's legal guardian, her son.  States last night she felt weak all over.  Discussed results with son and will discharge home.  Given oral medications here.  Will discharge.        Final Clinical Impression(s) / ED Diagnoses Final diagnoses:  Hypertension, unspecified type    Rx / DC Orders ED Discharge Orders     None         Benjiman Core, MD 12/26/22 1247

## 2022-12-26 NOTE — ED Notes (Signed)
Up to br via wc

## 2023-01-02 ENCOUNTER — Emergency Department (HOSPITAL_COMMUNITY)
Admission: EM | Admit: 2023-01-02 | Discharge: 2023-01-02 | Disposition: A | Discharge status: Home / Self Care | Payer: Medicare Other | Attending: Emergency Medicine | Admitting: Emergency Medicine

## 2023-01-02 ENCOUNTER — Other Ambulatory Visit: Payer: Self-pay

## 2023-01-02 ENCOUNTER — Emergency Department (HOSPITAL_COMMUNITY): Payer: Medicare Other

## 2023-01-02 ENCOUNTER — Encounter (HOSPITAL_COMMUNITY): Payer: Self-pay | Admitting: Pharmacy Technician

## 2023-01-02 DIAGNOSIS — R5381 Other malaise: Secondary | ICD-10-CM | POA: Diagnosis present

## 2023-01-02 DIAGNOSIS — I159 Secondary hypertension, unspecified: Secondary | ICD-10-CM

## 2023-01-02 DIAGNOSIS — F0390 Unspecified dementia without behavioral disturbance: Secondary | ICD-10-CM | POA: Insufficient documentation

## 2023-01-02 LAB — BASIC METABOLIC PANEL
Anion gap: 8 (ref 5–15)
BUN: 14 mg/dL (ref 8–23)
CO2: 26 mmol/L (ref 22–32)
Calcium: 9.1 mg/dL (ref 8.9–10.3)
Chloride: 104 mmol/L (ref 98–111)
Creatinine, Ser: 1.05 mg/dL — ABNORMAL HIGH (ref 0.44–1.00)
GFR, Estimated: 51 mL/min — ABNORMAL LOW (ref >60)
Glucose, Bld: 92 mg/dL (ref 70–99)
Potassium: 3.1 mmol/L — ABNORMAL LOW (ref 3.5–5.1)
Sodium: 138 mmol/L (ref 135–145)

## 2023-01-02 LAB — URINALYSIS, ROUTINE W REFLEX MICROSCOPIC
Bilirubin Urine: NEGATIVE
Glucose, UA: NEGATIVE mg/dL
Hgb urine dipstick: NEGATIVE
Ketones, ur: NEGATIVE mg/dL
Leukocytes,Ua: NEGATIVE
Nitrite: NEGATIVE
Protein, ur: NEGATIVE mg/dL
Specific Gravity, Urine: 1.008 (ref 1.005–1.030)
pH: 7 (ref 5.0–8.0)

## 2023-01-02 LAB — CBC WITH DIFFERENTIAL/PLATELET
Abs Immature Granulocytes: 0.01 10*3/uL (ref 0.00–0.07)
Basophils Absolute: 0.1 10*3/uL (ref 0.0–0.1)
Basophils Relative: 1 %
Eosinophils Absolute: 0.4 10*3/uL (ref 0.0–0.5)
Eosinophils Relative: 8 %
HCT: 35.4 % — ABNORMAL LOW (ref 36.0–46.0)
Hemoglobin: 11.6 g/dL — ABNORMAL LOW (ref 12.0–15.0)
Immature Granulocytes: 0 %
Lymphocytes Relative: 22 %
Lymphs Abs: 1.2 10*3/uL (ref 0.7–4.0)
MCH: 30.4 pg (ref 26.0–34.0)
MCHC: 32.8 g/dL (ref 30.0–36.0)
MCV: 92.7 fL (ref 80.0–100.0)
Monocytes Absolute: 0.5 10*3/uL (ref 0.1–1.0)
Monocytes Relative: 9 %
Neutro Abs: 3.2 10*3/uL (ref 1.7–7.7)
Neutrophils Relative %: 60 %
Platelets: 186 10*3/uL (ref 150–400)
RBC: 3.82 MIL/uL — ABNORMAL LOW (ref 3.87–5.11)
RDW: 13.9 % (ref 11.5–15.5)
WBC: 5.4 10*3/uL (ref 4.0–10.5)
nRBC: 0 % (ref 0.0–0.2)

## 2023-01-02 IMAGING — CT CT CERVICAL SPINE W/O CM
5 of 8 series · 12 of 33 positions shown, 13 images · non-contrast
Comparison: Head CT 08/03/2020.  Cervical spine MRI 12/03/2014.

CLINICAL DATA: 87-year-old female status post fall.

EXAM:
CT HEAD WITHOUT CONTRAST
CT CERVICAL SPINE WITHOUT CONTRAST
TECHNIQUE: Multidetector CT imaging of the head and cervical spine was
performed following the standard protocol without intravenous
contrast. Multiplanar CT image reconstructions of the cervical spine
were also generated.

[Series 5: head bone · axial · 0.43mm/px · z∈[+403,+459]mm · 2 of 86 slices shown]
[im 29/86  bone]
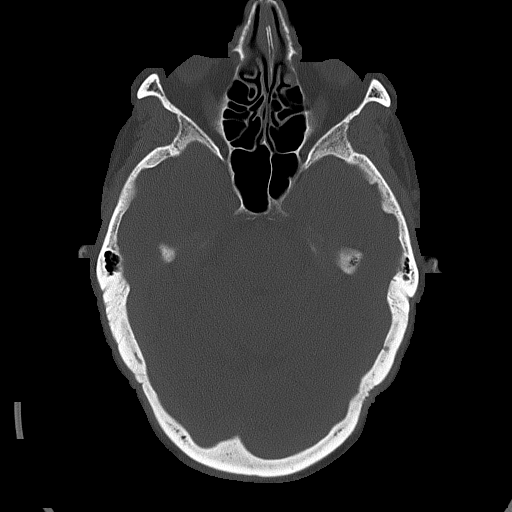
[im 57/86  bone]
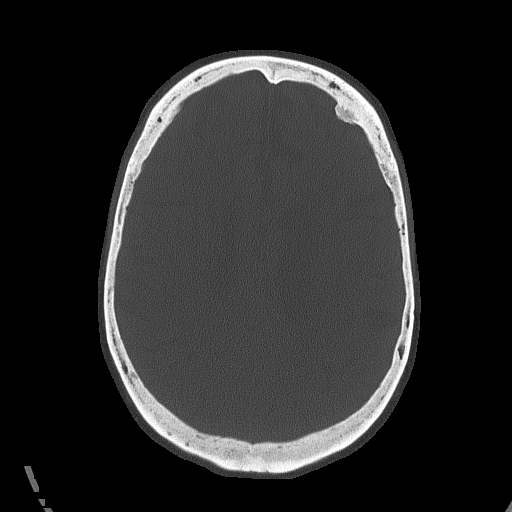

[Series 8: c_spine 2.0 st · axial · 0.30mm/px · z∈[+248,+320]mm · 2 of 109 slices shown]
[im 37/109  bone]
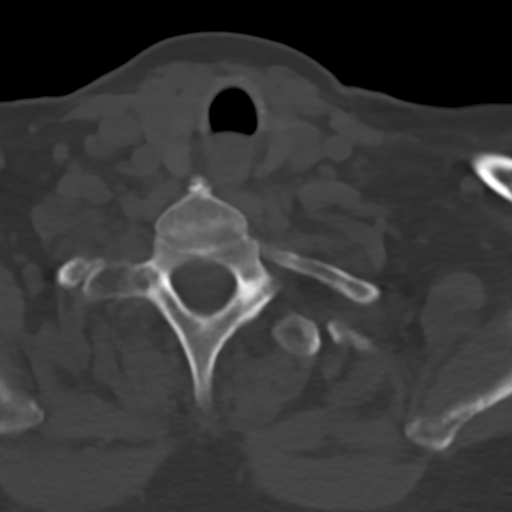
[im 73/109  bone]
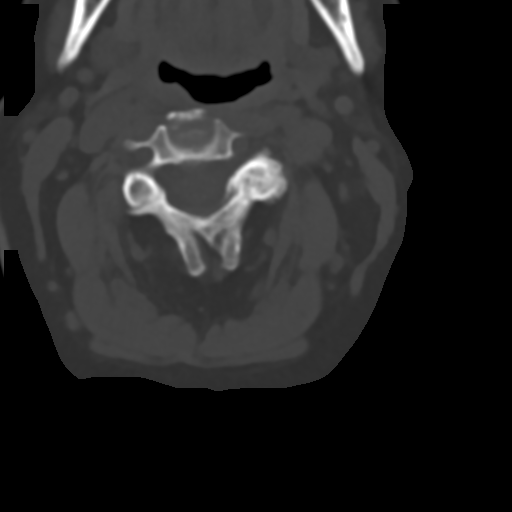

[Series 10: c_spine 2.0 sag bone · sagittal · 0.26mm/px · 4 of 61 slices shown]
[im 13/61  bone]
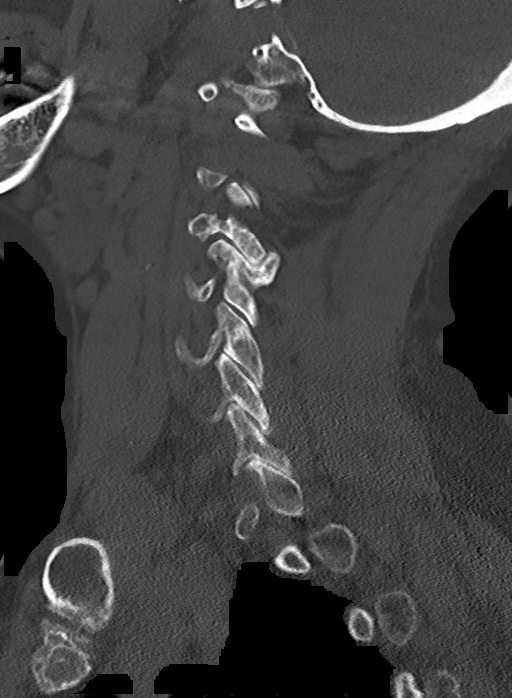
[im 25/61  bone]
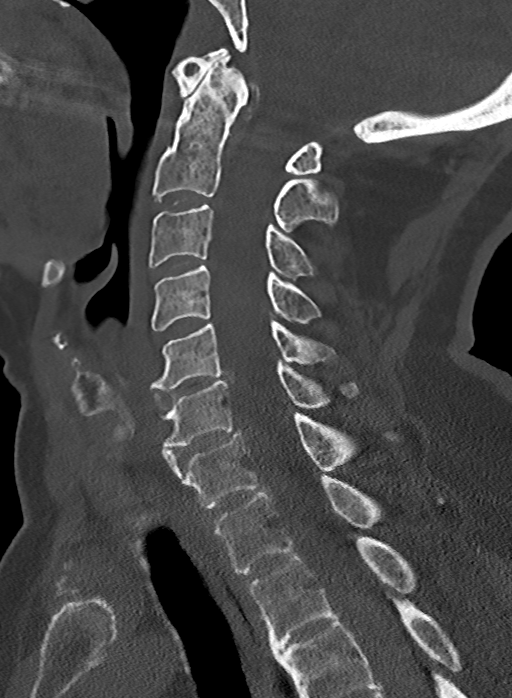
[im 37/61  bone]
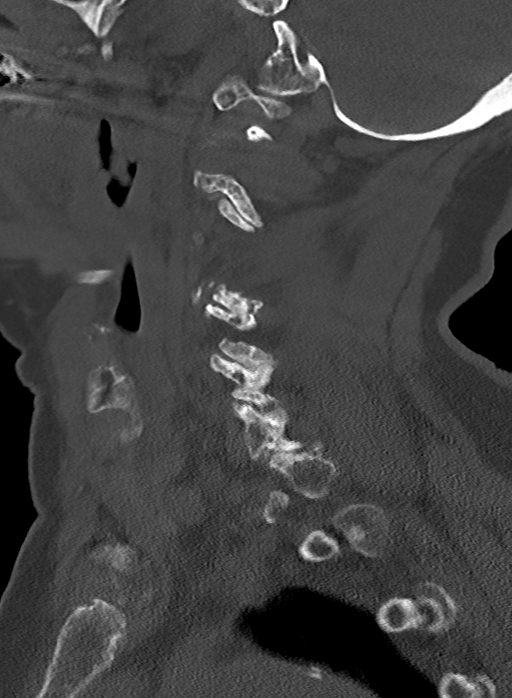
[im 49/61  bone]
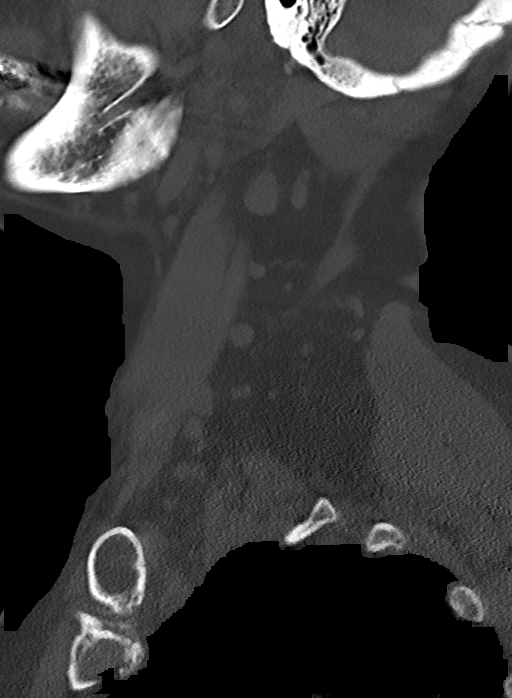

[Series 11: c_spine 2.0 cor bone · coronal · 0.32mm/px · 1 of 61 slices shown]
[im 31/61  bone]
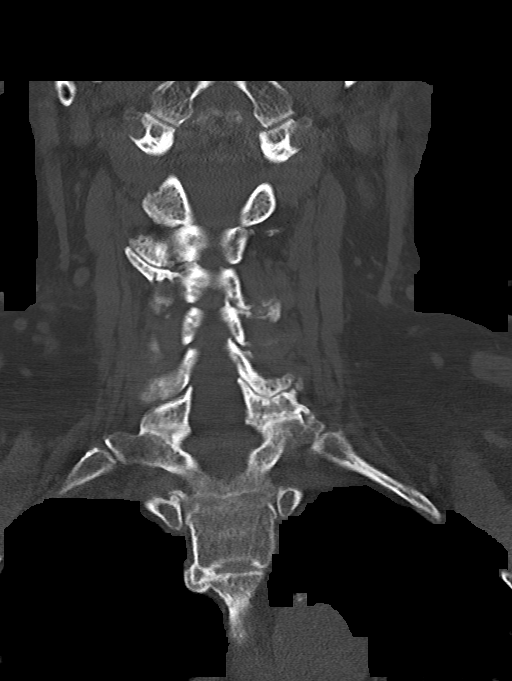

[Series 12: c_spine 2.0 orthogonals · oblique · 0.21mm/px · 3 of 113 slices shown, 4 images]
[im 29/113  soft-tissue]
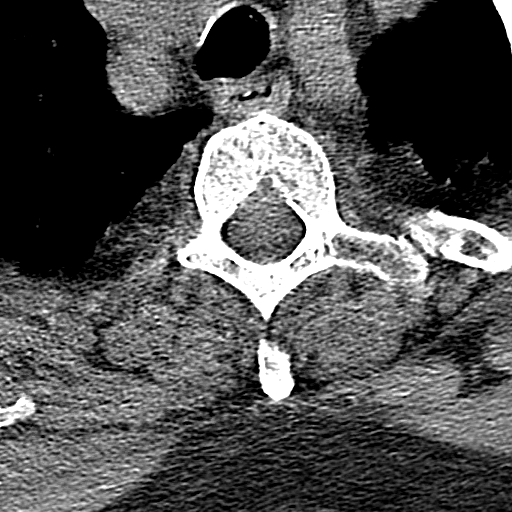
[im 29/113  bone]
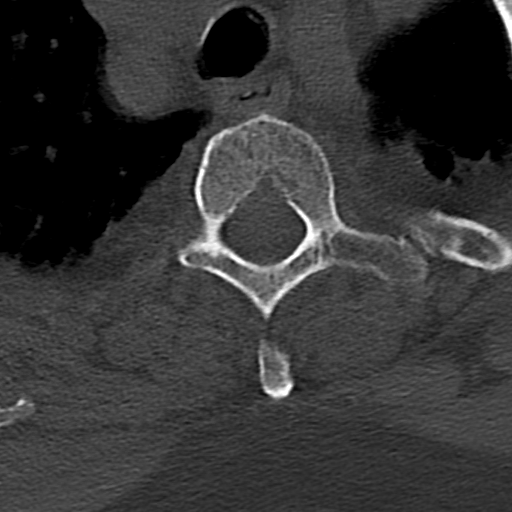
[im 57/113  bone]
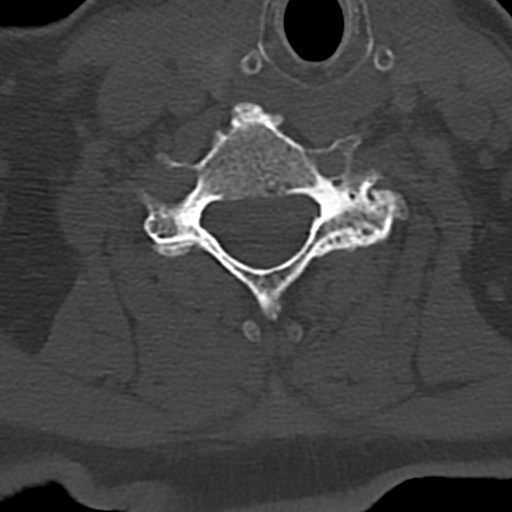
[im 85/113  bone]
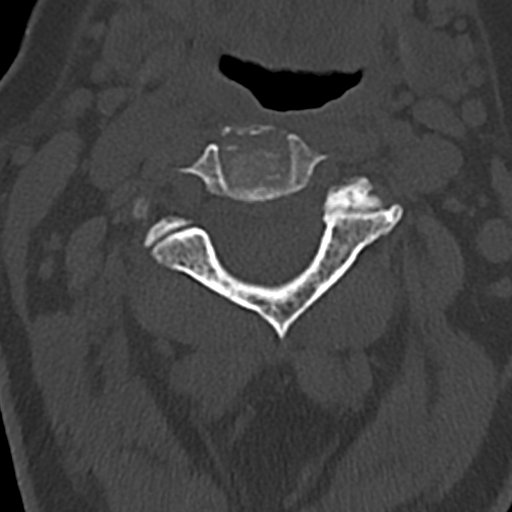

[12 of 33 positions shown; findings below may reference images not displayed]

FINDINGS: CT HEAD FINDINGS

Brain: Smooth round small calcified meningioma versus dural
calcification along the left frontal convexity on series 4, image 23
is stable since last year, measuring about 13 mm, but increased
since 8503. No significant mass effect. No associated cerebral
edema.

No other intracranial mass lesion. No midline shift,
ventriculomegaly, intracranial hemorrhage or evidence of cortically
based acute infarction. Patchy and confluent bilateral cerebral
white matter hypodensity and heterogeneity in the deep gray nuclei
appears stable.

Vascular: Mild Calcified atherosclerosis at the skull base. No
suspicious intracranial vascular hyperdensity.

Skull: Round dural calcification versus calcified meningioma along
the left frontal convexity on series 5, image 55 is increased since
8503 as above and new since 4064. The skull otherwise appears stable
and intact.

Sinuses/Orbits: Chronic left maxillary sinusitis has not
significantly changed since last year. Other paranasal sinuses and
mastoids are clear. Tympanic cavities are clear.

Other: No orbit or scalp soft tissue injury identified.

CT CERVICAL SPINE FINDINGS

Alignment: Stable cervical lordosis. Cervicothoracic junction
alignment is within normal limits. Bilateral posterior element
alignment is within normal limits.

Skull base and vertebrae: Visualized skull base is intact. No
atlanto-occipital dissociation. C1 and C2 appear intact and aligned.
No acute osseous abnormality identified.

Soft tissues and spinal canal: No prevertebral fluid or swelling. No
visible canal hematoma. Negative visible noncontrast neck soft
tissues.

Disc levels: Widespread severe cervical facet arthropathy, generally
greater on the left. Lower cervical disc bulging and endplate
spurring, however, capacious spinal canal as seen on the 8503 MRI.
No significant spinal stenosis suspected.

Upper chest: Upper thoracic interbody ankylosis related to flowing
endplate osteophytes at T2-T3. Visible upper thoracic levels appear
intact. Calcified aortic atherosclerosis. Negative lung apices.
IMPRESSION: 1. No acute traumatic injury identified in the head or cervical
spine.
2. A small left frontal convexity calcified meningioma versus dural
calcification is 13 mm, stable since last year, and appears
inconsequential with no significant mass effect.
3. Evidence of cerebral chronic small vessel disease.
4. Advanced cervical facet degeneration.
5. Chronic left maxillary sinusitis.
6. Aortic Atherosclerosis ([id number]).

## 2023-01-02 MED ORDER — POTASSIUM CHLORIDE 20 MEQ PO PACK
40.0000 meq | PACK | Freq: Once | ORAL | Status: AC
  Administered 2023-01-02: 40 meq via ORAL
  Filled 2023-01-02: qty 2

## 2023-01-02 MED ORDER — IRBESARTAN 300 MG PO TABS
300.0000 mg | ORAL_TABLET | Freq: Every day | ORAL | Status: DC
  Administered 2023-01-02: 300 mg via ORAL
  Filled 2023-01-02: qty 1

## 2023-01-02 MED ORDER — FELODIPINE ER 10 MG PO TB24
10.0000 mg | ORAL_TABLET | Freq: Every day | ORAL | Status: DC
  Administered 2023-01-02: 10 mg via ORAL
  Filled 2023-01-02: qty 1

## 2023-01-02 MED ORDER — METOPROLOL TARTRATE 25 MG PO TABS
100.0000 mg | ORAL_TABLET | Freq: Once | ORAL | Status: AC
  Administered 2023-01-02: 100 mg via ORAL
  Filled 2023-01-02: qty 4

## 2023-01-02 NOTE — ED Notes (Signed)
Patient Alert and oriented to baseline. Stable and ambulatory to baseline. Patient verbalized understanding of the discharge instructions.  Patient belongings were taken by the patient.   

## 2023-01-02 NOTE — ED Provider Notes (Signed)
[place] EMERGENCY DEPARTMENT AT [facility] Provider Note   CSN: [id number] Arrival date & time: 01/02/23  0827     History Chief Complaint  Patient presents with   No Compaint    HPI Tiffany Black is a 89 y.o. female presenting this morning.  States that she has no complaints.  Has no symptoms.  States she had a dream that she would go to the emergency room today so she called the emergency room.  She does have dementia.  Her son told EMS that he does not know why she called. Attempted to call the family for more collateral and they are not answering any of the listed phones. Patient declined any symptoms.  She states that she feels well comfortably sitting in bed watching TV   Patient's recorded medical, surgical, social, medication list and allergies were reviewed in the Snapshot window as part of the initial history.   Review of Systems   Review of Systems  Constitutional:  Negative for chills and fever.  HENT:  Negative for ear pain and sore throat.   Eyes:  Negative for pain and visual disturbance.  Respiratory:  Negative for cough and shortness of breath.   Cardiovascular:  Negative for chest pain and palpitations.  Gastrointestinal:  Negative for abdominal pain and vomiting.  Genitourinary:  Negative for dysuria and hematuria.  Musculoskeletal:  Negative for arthralgias and back pain.  Skin:  Negative for color change and rash.  Neurological:  Negative for seizures and syncope.  All other systems reviewed and are negative.   Physical Exam Updated Vital Signs BP (!) 176/111   Pulse 86   Temp (!) 97.5 F (36.4 C)   Resp 16   SpO2 94%  Physical Exam Vitals and nursing note reviewed.  Constitutional:      General: She is not in acute distress.    Appearance: She is well-developed.  HENT:     Head: Normocephalic and atraumatic.  Eyes:     Conjunctiva/sclera: Conjunctivae normal.  Cardiovascular:     Rate and Rhythm: Normal rate and regular  rhythm.     Heart sounds: No murmur heard. Pulmonary:     Effort: Pulmonary effort is normal. No respiratory distress.     Breath sounds: Normal breath sounds.  Abdominal:     General: There is no distension.     Palpations: Abdomen is soft.     Tenderness: There is no abdominal tenderness. There is no right CVA tenderness or left CVA tenderness.  Musculoskeletal:        General: No swelling or tenderness. Normal range of motion.     Cervical back: Neck supple.  Skin:    General: Skin is warm and dry.  Neurological:     General: No focal deficit present.     Mental Status: She is alert and oriented to person, place, and time. Mental status is at baseline.     Cranial Nerves: No cranial nerve deficit.      ED Course/ Medical Decision Making/ A&P    Procedures Procedures   Medications Ordered in ED Medications  irbesartan (AVAPRO) tablet 300 mg (300 mg Oral Given 01/02/23 0945)  felodipine (PLENDIL) 24 hr tablet 10 mg (10 mg Oral Given 01/02/23 1107)  metoprolol tartrate (LOPRESSOR) tablet 100 mg (100 mg Oral Given 01/02/23 0946)  potassium chloride (KLOR-CON) packet 40 mEq (40 mEq Oral Given 01/02/23 1057)   Patient's history of present illness and physical exam findings are not consistent with any  acute pathology.  She is at her mental status baseline per chart review.  Attempted to update the family member. Per chart, okay to leave voice messages for son Eldana Isip.   Called back to leave voice message.  Patient's son answered on this attempt. States that she has no complaints and that he thinks she has been doing well.  She finished a course of antibiotics for a UTI that is overall improving.  He felt comfortable with her returning home as long as she appeared okay.  Unfortunately she was waiting in the emergency room, her blood pressure climbed to above 200.  She was administered all of her home blood pressure medicines as she states she forgot to take them this morning.   Blood pressure began to return to normal throughout her 3-hour observation.  Blood work was performed with no focal pathology identified.  Given resolution of all symptoms, improvement of vital signs patient stable for outpatient care and management Disposition:  I have considered need for hospitalization, however, considering all of the above, I believe this patient is stable for discharge at this time.  Patient/family educated about specific return precautions for given chief complaint and symptoms.  Patient/family educated about follow-up with PCP.     Patient/family expressed understanding of return precautions and need for follow-up. Patient spoken to regarding all imaging and laboratory results and appropriate follow up for these results. All education provided in verbal form with additional information in written form. Time was allowed for answering of patient questions. Patient discharged.    Emergency Department Medication Summary:   Medications  irbesartan (AVAPRO) tablet 300 mg (300 mg Oral Given 01/02/23 0945)  felodipine (PLENDIL) 24 hr tablet 10 mg (10 mg Oral Given 01/02/23 1107)  metoprolol tartrate (LOPRESSOR) tablet 100 mg (100 mg Oral Given 01/02/23 0946)  potassium chloride (KLOR-CON) packet 40 mEq (40 mEq Oral Given 01/02/23 1057)         Clinical Impression:  1. Malaise      Discharge   Final Clinical Impression(s) / ED Diagnoses Final diagnoses:  Malaise    Rx / DC Orders ED Discharge Orders     None         Glyn Ade, MD 01/02/23 1126

## 2023-01-02 NOTE — ED Triage Notes (Signed)
Pt bib ems from home. Pt states she had a dream she was going to the hospital so she called for EMS transport to the hospital. Pt denies any complaints. Son unable to provide information as to why patient called. Pt with hx dementia. VSS with EMS.

## 2023-01-08 ENCOUNTER — Encounter (HOSPITAL_COMMUNITY): Payer: Self-pay

## 2023-01-08 ENCOUNTER — Emergency Department (HOSPITAL_COMMUNITY)
Admission: EM | Admit: 2023-01-08 | Discharge: 2023-01-08 | Disposition: A | Discharge status: Home / Self Care | Payer: Medicare Other | Attending: Emergency Medicine | Admitting: Emergency Medicine

## 2023-01-08 ENCOUNTER — Other Ambulatory Visit: Payer: Self-pay

## 2023-01-08 ENCOUNTER — Emergency Department (HOSPITAL_COMMUNITY): Payer: Medicare Other

## 2023-01-08 DIAGNOSIS — F0390 Unspecified dementia without behavioral disturbance: Secondary | ICD-10-CM | POA: Diagnosis not present

## 2023-01-08 DIAGNOSIS — Z79899 Other long term (current) drug therapy: Secondary | ICD-10-CM | POA: Insufficient documentation

## 2023-01-08 DIAGNOSIS — W19XXXA Unspecified fall, initial encounter: Secondary | ICD-10-CM

## 2023-01-08 DIAGNOSIS — Z91040 Latex allergy status: Secondary | ICD-10-CM | POA: Diagnosis not present

## 2023-01-08 DIAGNOSIS — W1839XA Other fall on same level, initial encounter: Secondary | ICD-10-CM | POA: Insufficient documentation

## 2023-01-08 DIAGNOSIS — R0781 Pleurodynia: Secondary | ICD-10-CM | POA: Diagnosis not present

## 2023-01-08 DIAGNOSIS — I10 Essential (primary) hypertension: Secondary | ICD-10-CM | POA: Insufficient documentation

## 2023-01-08 DIAGNOSIS — S39012A Strain of muscle, fascia and tendon of lower back, initial encounter: Secondary | ICD-10-CM | POA: Diagnosis not present

## 2023-01-08 DIAGNOSIS — S3992XA Unspecified injury of lower back, initial encounter: Secondary | ICD-10-CM | POA: Diagnosis present

## 2023-01-08 DIAGNOSIS — Y92009 Unspecified place in unspecified non-institutional (private) residence as the place of occurrence of the external cause: Secondary | ICD-10-CM | POA: Insufficient documentation

## 2023-01-08 DIAGNOSIS — Y9301 Activity, walking, marching and hiking: Secondary | ICD-10-CM | POA: Diagnosis not present

## 2023-01-08 LAB — CBC WITH DIFFERENTIAL/PLATELET
Abs Immature Granulocytes: 0.02 10*3/uL (ref 0.00–0.07)
Basophils Absolute: 0 10*3/uL (ref 0.0–0.1)
Basophils Relative: 1 %
Eosinophils Absolute: 0.3 10*3/uL (ref 0.0–0.5)
Eosinophils Relative: 4 %
HCT: 40.7 % (ref 36.0–46.0)
Hemoglobin: 13.3 g/dL (ref 12.0–15.0)
Immature Granulocytes: 0 %
Lymphocytes Relative: 18 %
Lymphs Abs: 1.2 10*3/uL (ref 0.7–4.0)
MCH: 30.4 pg (ref 26.0–34.0)
MCHC: 32.7 g/dL (ref 30.0–36.0)
MCV: 92.9 fL (ref 80.0–100.0)
Monocytes Absolute: 0.5 10*3/uL (ref 0.1–1.0)
Monocytes Relative: 7 %
Neutro Abs: 4.5 10*3/uL (ref 1.7–7.7)
Neutrophils Relative %: 70 %
Platelets: 230 10*3/uL (ref 150–400)
RBC: 4.38 MIL/uL (ref 3.87–5.11)
RDW: 14.1 % (ref 11.5–15.5)
WBC: 6.5 10*3/uL (ref 4.0–10.5)
nRBC: 0 % (ref 0.0–0.2)

## 2023-01-08 LAB — BASIC METABOLIC PANEL
Anion gap: 11 (ref 5–15)
BUN: 15 mg/dL (ref 8–23)
CO2: 24 mmol/L (ref 22–32)
Calcium: 9.3 mg/dL (ref 8.9–10.3)
Chloride: 100 mmol/L (ref 98–111)
Creatinine, Ser: 1.07 mg/dL — ABNORMAL HIGH (ref 0.44–1.00)
GFR, Estimated: 50 mL/min — ABNORMAL LOW (ref >60)
Glucose, Bld: 108 mg/dL — ABNORMAL HIGH (ref 70–99)
Potassium: 3.9 mmol/L (ref 3.5–5.1)
Sodium: 135 mmol/L (ref 135–145)

## 2023-01-08 MED ORDER — METOPROLOL TARTRATE 25 MG PO TABS
100.0000 mg | ORAL_TABLET | Freq: Once | ORAL | Status: AC
  Administered 2023-01-08: 100 mg via ORAL
  Filled 2023-01-08: qty 4

## 2023-01-08 MED ORDER — AMLODIPINE BESYLATE 5 MG PO TABS
5.0000 mg | ORAL_TABLET | Freq: Once | ORAL | Status: AC
  Administered 2023-01-08: 5 mg via ORAL
  Filled 2023-01-08: qty 1

## 2023-01-08 MED ORDER — ACETAMINOPHEN 325 MG PO TABS
650.0000 mg | ORAL_TABLET | Freq: Once | ORAL | Status: AC
  Administered 2023-01-08: 650 mg via ORAL
  Filled 2023-01-08: qty 2

## 2023-01-08 MED ORDER — LIDOCAINE 5 % EX PTCH: 1.0000 | MEDICATED_PATCH | CUTANEOUS | 0 refills | Status: DC

## 2023-01-08 MED ORDER — FENTANYL CITRATE PF 50 MCG/ML IJ SOSY
50.0000 ug | PREFILLED_SYRINGE | Freq: Once | INTRAMUSCULAR | Status: AC
  Administered 2023-01-08: 50 ug via INTRAVENOUS
  Filled 2023-01-08: qty 1

## 2023-01-08 NOTE — ED Notes (Signed)
Discharge papers reviewed with pt and family member. Given at home dose of BP meds for elevation prior to DC. Left ED with son

## 2023-01-08 NOTE — Discharge Instructions (Addendum)
Take over-the-counter medications as needed for pain.  You can also use the lidocaine patches to help with pain and discomfort.

## 2023-01-08 NOTE — ED Triage Notes (Signed)
Patient coming from home via EMS with c/o right sided mid back pain from a fall today. Per EMS pt has extensive hx of falls- forgets she is not supposed to walk without assistance. Hx dementia. Per EMS, at baseline and not currently on any blood thinners.

## 2023-01-08 NOTE — ED Provider Notes (Signed)
[place] EMERGENCY DEPARTMENT AT [facility] Provider Note   CSN: [id number] Arrival date & time: 01/08/23  1600     History {Add pertinent medical, surgical, social history, OB history to HPI:1} Chief Complaint  Patient presents with   Tiffany Black is a 89 y.o. female.   Fall     Patient has a history of hypertension arthritis anxiety hyperlipidemia cardiomegaly, atrial fibrillation, dementia, stroke.  Patient presents to the ED for evaluation after a fall.  Per the EMS report patient had a fall today.  She was walking without her walker and lost her balance.  Patient is not supposed to be walking without assistance.  Patient herself does not recall what happened.  She is not sure why she is here.  However when she did sit up she started having pain in her lower back more towards the right side.  Home Medications Prior to Admission medications   Medication Sig Start Date End Date Taking? Authorizing Provider  cephALEXin (KEFLEX) 500 MG capsule Take 1 capsule (500 mg total) by mouth 4 (four) times daily. 12/08/22   Mesner, Barbara Cower, MD  diclofenac Sodium (VOLTAREN) 1 % GEL Apply 2 g topically 4 (four) times daily. To right wrist Patient not taking: Reported on 12/18/2022 10/02/22   Almon Hercules, MD  felodipine (PLENDIL) 10 MG 24 hr tablet Take 1 tablet (10 mg total) by mouth daily. 08/16/22   Elgergawy, Leana Roe, MD  FLUoxetine (PROZAC) 10 MG capsule Take 10 mg by mouth daily.    [provider]  metoprolol tartrate (LOPRESSOR) 100 MG tablet Take 1 tablet (100 mg total) by mouth 2 (two) times daily. 11/22/22   Sheilah Pigeon, PA-C  potassium chloride SA (KLOR-CON M) 20 MEQ tablet One po bid x 3 days, then take one po once a day 12/11/22   Cathren Laine, MD  potassium chloride SA (KLOR-CON M20) 20 MEQ tablet Take 10 mEq by mouth. Every 90 days    [provider]  QUEtiapine (SEROQUEL) 50 MG tablet Take 1 tablet (50 mg total) by mouth at  bedtime. Patient not taking: Reported on 12/18/2022 08/15/22   Elgergawy, Leana Roe, MD  Rivaroxaban (XARELTO) 15 MG TABS tablet Take 1 tablet (15 mg total) by mouth daily with supper. Patient not taking: Reported on 12/18/2022 03/06/22   Duke Salvia, MD  senna-docusate (SENOKOT-S) 8.6-50 MG tablet Take 1 tablet by mouth at bedtime as needed for mild constipation. 10/02/22   Almon Hercules, MD  valsartan (DIOVAN) 320 MG tablet Take 1 tablet (320 mg total) by mouth daily. 10/02/22   Almon Hercules, MD      Allergies    Hydrochlorothiazide, Penicillins, Ciprofloxacin, Doxycycline, Hydrocodone-acetaminophen, Iodinated contrast media, Iodine-131, Lasix [furosemide], Latex, Niaspan [niacin], Statins, Carbamazepine, Codeine, Nickel, Sulfa antibiotics, Sulfamethoxazole, and Sulfonamide derivatives    Review of Systems   Review of Systems  Physical Exam Updated Vital Signs BP (!) 179/130 (BP Location: Right Arm)   Pulse 61   Temp 98.1 F (36.7 C) (Oral)   Resp 17   SpO2 93%  Physical Exam Vitals and nursing note reviewed.  Constitutional:      Appearance: She is well-developed. She is not diaphoretic.  HENT:     Head: Normocephalic and atraumatic.     Right Ear: External ear normal.     Left Ear: External ear normal.  Eyes:     General: No scleral icterus.       Right  eye: No discharge.        Left eye: No discharge.     Conjunctiva/sclera: Conjunctivae normal.  Neck:     Trachea: No tracheal deviation.  Cardiovascular:     Rate and Rhythm: Normal rate and regular rhythm.  Pulmonary:     Effort: Pulmonary effort is normal. No respiratory distress.     Breath sounds: Normal breath sounds. No stridor. No wheezing or rales.  Chest:     Comments: Tenderness palpation adjacent to the lower right rib margin Abdominal:     General: Bowel sounds are normal. There is no distension.     Palpations: Abdomen is soft.     Tenderness: There is no abdominal tenderness. There is no guarding or  rebound.  Musculoskeletal:        General: No deformity.     Cervical back: Normal and neck supple.     Thoracic back: Normal.     Lumbar back: Tenderness present.     Comments: Tenderness palpation lumbar spine  Skin:    General: Skin is warm and dry.     Findings: No rash.     Comments: No abrasions or ecchymoses noted  Neurological:     General: No focal deficit present.     Mental Status: She is alert.     Cranial Nerves: No cranial nerve deficit, dysarthria or facial asymmetry.     Sensory: No sensory deficit.     Motor: No abnormal muscle tone or seizure activity.     Coordination: Coordination normal.  Psychiatric:        Mood and Affect: Mood normal.     ED Results / Procedures / Treatments   Labs (all labs ordered are listed, but only abnormal results are displayed) Labs Reviewed - No data to display  EKG None  Radiology No results found.  Procedures Procedures  {Document cardiac monitor, telemetry assessment procedure when appropriate:1}  Medications Ordered in ED Medications  acetaminophen (TYLENOL) tablet 650 mg (has no administration in time range)    ED Course/ Medical Decision Making/ A&P   {   Click here for ABCD2, HEART and other calculatorsREFRESH Note before signing :1}                          Medical Decision Making Amount and/or Complexity of Data Reviewed Labs: ordered. Radiology: ordered.  Risk OTC drugs.   ***  {Document critical care time when appropriate:1} {Document review of labs and clinical decision tools ie heart score, Chads2Vasc2 etc:1}  {Document your independent review of radiology images, and any outside records:1} {Document your discussion with family members, caretakers, and with consultants:1} {Document social determinants of health affecting pt's care:1} {Document your decision making why or why not admission, treatments were needed:1} Final Clinical Impression(s) / ED Diagnoses Final diagnoses:  None    Rx  / DC Orders ED Discharge Orders     None

## 2023-01-14 ENCOUNTER — Emergency Department (HOSPITAL_COMMUNITY)
Admission: EM | Admit: 2023-01-14 | Discharge: 2023-01-14 | Disposition: A | Discharge status: Home / Self Care | Payer: Medicare Other | Attending: Emergency Medicine | Admitting: Emergency Medicine

## 2023-01-14 ENCOUNTER — Encounter (HOSPITAL_COMMUNITY): Payer: Self-pay

## 2023-01-14 DIAGNOSIS — Z79899 Other long term (current) drug therapy: Secondary | ICD-10-CM | POA: Insufficient documentation

## 2023-01-14 DIAGNOSIS — I10 Essential (primary) hypertension: Secondary | ICD-10-CM | POA: Insufficient documentation

## 2023-01-14 DIAGNOSIS — F0390 Unspecified dementia without behavioral disturbance: Secondary | ICD-10-CM | POA: Insufficient documentation

## 2023-01-14 DIAGNOSIS — Z91040 Latex allergy status: Secondary | ICD-10-CM | POA: Insufficient documentation

## 2023-01-14 DIAGNOSIS — I4891 Unspecified atrial fibrillation: Secondary | ICD-10-CM | POA: Diagnosis not present

## 2023-01-14 DIAGNOSIS — R531 Weakness: Secondary | ICD-10-CM | POA: Insufficient documentation

## 2023-01-14 DIAGNOSIS — Z7901 Long term (current) use of anticoagulants: Secondary | ICD-10-CM | POA: Diagnosis not present

## 2023-01-14 LAB — URINALYSIS, ROUTINE W REFLEX MICROSCOPIC
Bilirubin Urine: NEGATIVE
Glucose, UA: NEGATIVE mg/dL
Hgb urine dipstick: NEGATIVE
Ketones, ur: NEGATIVE mg/dL
Nitrite: NEGATIVE
Protein, ur: 30 mg/dL — AB
Specific Gravity, Urine: 1.014 (ref 1.005–1.030)
pH: 6 (ref 5.0–8.0)

## 2023-01-14 LAB — BASIC METABOLIC PANEL
Anion gap: 8 (ref 5–15)
BUN: 16 mg/dL (ref 8–23)
CO2: 24 mmol/L (ref 22–32)
Calcium: 9.1 mg/dL (ref 8.9–10.3)
Chloride: 102 mmol/L (ref 98–111)
Creatinine, Ser: 1.02 mg/dL — ABNORMAL HIGH (ref 0.44–1.00)
GFR, Estimated: 53 mL/min — ABNORMAL LOW (ref >60)
Glucose, Bld: 101 mg/dL — ABNORMAL HIGH (ref 70–99)
Potassium: 3.8 mmol/L (ref 3.5–5.1)
Sodium: 134 mmol/L — ABNORMAL LOW (ref 135–145)

## 2023-01-14 LAB — CBC WITH DIFFERENTIAL/PLATELET
Abs Immature Granulocytes: 0.02 10*3/uL (ref 0.00–0.07)
Basophils Absolute: 0.1 10*3/uL (ref 0.0–0.1)
Basophils Relative: 1 %
Eosinophils Absolute: 0.3 10*3/uL (ref 0.0–0.5)
Eosinophils Relative: 4 %
HCT: 36.8 % (ref 36.0–46.0)
Hemoglobin: 11.9 g/dL — ABNORMAL LOW (ref 12.0–15.0)
Immature Granulocytes: 0 %
Lymphocytes Relative: 21 %
Lymphs Abs: 1.2 10*3/uL (ref 0.7–4.0)
MCH: 29.9 pg (ref 26.0–34.0)
MCHC: 32.3 g/dL (ref 30.0–36.0)
MCV: 92.5 fL (ref 80.0–100.0)
Monocytes Absolute: 0.6 10*3/uL (ref 0.1–1.0)
Monocytes Relative: 10 %
Neutro Abs: 3.7 10*3/uL (ref 1.7–7.7)
Neutrophils Relative %: 64 %
Platelets: 203 10*3/uL (ref 150–400)
RBC: 3.98 MIL/uL (ref 3.87–5.11)
RDW: 14.1 % (ref 11.5–15.5)
WBC: 5.9 10*3/uL (ref 4.0–10.5)
nRBC: 0 % (ref 0.0–0.2)

## 2023-01-14 MED ORDER — QUETIAPINE FUMARATE 50 MG PO TABS
50.0000 mg | ORAL_TABLET | Freq: Every day | ORAL | Status: DC
  Administered 2023-01-14: 50 mg via ORAL
  Filled 2023-01-14: qty 1

## 2023-01-14 NOTE — ED Provider Notes (Signed)
[place] EMERGENCY DEPARTMENT AT [facility] Provider Note   CSN: [id number] Arrival date & time: 01/14/23  0308     History  Chief Complaint  Patient presents with   Weakness    Tiffany Black is a 89 y.o. female.  89 y/o female with hx of HTN, Afib (on chronic Xarelto), CVA, and dementia presents from home via EMS. EMS reporting call out for c/o generalized weakness. Patient denies any acute complaints; states she does not know why she is here. Denies chest pain, SOB, abdominal pain. EMS reported that patient had not taken her Seroquel tonight.  The history is provided by the EMS personnel and the patient. No language interpreter was used.  Weakness      Home Medications Prior to Admission medications   Medication Sig Start Date End Date Taking? Authorizing Provider  cephALEXin (KEFLEX) 500 MG capsule Take 1 capsule (500 mg total) by mouth 4 (four) times daily. 12/08/22   Mesner, Barbara Cower, MD  diclofenac Sodium (VOLTAREN) 1 % GEL Apply 2 g topically 4 (four) times daily. To right wrist Patient not taking: Reported on 12/18/2022 10/02/22   Almon Hercules, MD  felodipine (PLENDIL) 10 MG 24 hr tablet Take 1 tablet (10 mg total) by mouth daily. 08/16/22   Elgergawy, Leana Roe, MD  FLUoxetine (PROZAC) 10 MG capsule Take 10 mg by mouth daily.    [provider]  lidocaine (LIDODERM) 5 % Place 1 patch onto the skin daily. Remove & Discard patch within 12 hours or as directed by MD 01/08/23   Linwood Dibbles, MD  metoprolol tartrate (LOPRESSOR) 100 MG tablet Take 1 tablet (100 mg total) by mouth 2 (two) times daily. 11/22/22   Sheilah Pigeon, PA-C  potassium chloride SA (KLOR-CON M) 20 MEQ tablet One po bid x 3 days, then take one po once a day 12/11/22   Cathren Laine, MD  potassium chloride SA (KLOR-CON M20) 20 MEQ tablet Take 10 mEq by mouth. Every 90 days    [provider]  QUEtiapine (SEROQUEL) 50 MG tablet Take 1 tablet (50 mg total) by mouth at bedtime. Patient  not taking: Reported on 12/18/2022 08/15/22   Elgergawy, Leana Roe, MD  Rivaroxaban (XARELTO) 15 MG TABS tablet Take 1 tablet (15 mg total) by mouth daily with supper. Patient not taking: Reported on 12/18/2022 03/06/22   Duke Salvia, MD  senna-docusate (SENOKOT-S) 8.6-50 MG tablet Take 1 tablet by mouth at bedtime as needed for mild constipation. 10/02/22   Almon Hercules, MD  valsartan (DIOVAN) 320 MG tablet Take 1 tablet (320 mg total) by mouth daily. 10/02/22   Almon Hercules, MD      Allergies    Hydrochlorothiazide, Penicillins, Ciprofloxacin, Doxycycline, Hydrocodone-acetaminophen, Iodinated contrast media, Iodine-131, Lasix [furosemide], Latex, Niaspan [niacin], Statins, Carbamazepine, Codeine, Nickel, Sulfa antibiotics, Sulfamethoxazole, and Sulfonamide derivatives    Review of Systems   Review of Systems  Unable to perform ROS: Dementia  Neurological:  Positive for weakness.    Physical Exam Updated Vital Signs BP (!) 168/109   Pulse 78   Temp 97.8 F (36.6 C) (Oral)   Resp (!) 21   Ht 5\' 5"  (1.651 m)   Wt 60 kg   SpO2 92%   BMI 22.01 kg/m   Physical Exam Vitals and nursing note reviewed.  Constitutional:      General: She is not in acute distress.    Appearance: She is well-developed. She is not diaphoretic.  Comments: Chronically ill and frail appearing. Nontoxic.  HENT:     Head: Normocephalic and atraumatic.  Eyes:     General: No scleral icterus.    Conjunctiva/sclera: Conjunctivae normal.  Cardiovascular:     Rate and Rhythm: Normal rate and regular rhythm.     Pulses: Normal pulses.  Pulmonary:     Effort: Pulmonary effort is normal. No respiratory distress.     Breath sounds: No stridor. No wheezing.     Comments: Respirations even and unlabored Abdominal:     General: There is no distension.     Palpations: Abdomen is soft.  Musculoskeletal:        General: Normal range of motion.     Cervical back: Normal range of motion.  Skin:    General: Skin  is warm and dry.     Coloration: Skin is not pale.     Findings: No erythema or rash.  Neurological:     Mental Status: She is alert and oriented to person, place, and time.     Coordination: Coordination normal.  Psychiatric:        Behavior: Behavior normal.     ED Results / Procedures / Treatments   Labs (all labs ordered are listed, but only abnormal results are displayed) Labs Reviewed  CBC WITH DIFFERENTIAL/PLATELET - Abnormal; Notable for the following components:      Result Value   Hemoglobin 11.9 (*)    All other components within normal limits  BASIC METABOLIC PANEL - Abnormal; Notable for the following components:   Sodium 134 (*)    Glucose, Bld 101 (*)    Creatinine, Ser 1.02 (*)    GFR, Estimated 53 (*)    All other components within normal limits  URINALYSIS, ROUTINE W REFLEX MICROSCOPIC - Abnormal; Notable for the following components:   Protein, ur 30 (*)    Leukocytes,Ua TRACE (*)    Bacteria, UA RARE (*)    All other components within normal limits    EKG EKG Interpretation  Date/Time:  Sunday Jan 14 2023 03:37:20 EDT Ventricular Rate:  84 PR Interval:    QRS Duration: 96 QT Interval:  377 QTC Calculation: 446 R Axis:   -55 Text Interpretation: Atrial fibrillation Ventricular premature complex Left anterior fascicular block Abnormal R-wave progression, late transition Nonspecific repol abnormality, diffuse leads Confirmed by Ross Marcus ([id number]) on 01/14/2023 5:51:19 AM  Radiology No results found.  Procedures Procedures    Medications Ordered in ED Medications  QUEtiapine (SEROQUEL) tablet 50 mg (50 mg Oral Given 01/14/23 0549)    ED Course/ Medical Decision Making/ A&P Clinical Course as of 01/14/23 9562  [name] Jan 14, 2023  1308 Attempted to contact son for collateral information without answer. Cell phone goes straight to VM. [KH]  [phone] Spoke with son who just woke up.  States that his mother was calling 911 multiple times beginning  around midnight.  He states that she was recently restarted on her Seroquel, but she has refused to take it.  She has been having hallucinations and has been trying to look for a lost child.  Son does not express any concerns about the workup completed in the emergency department.  He is comfortable with plan for patient's discharge.  Son to come and pick up the patient this morning. [KH]    Clinical Course User Index [KH] Antony Madura, PA-C  Medical Decision Making Amount and/or Complexity of Data Reviewed Labs: ordered. ECG/medicine tests: ordered.  Risk Prescription drug management.   This patient presents to the ED for concern of generalized weakness, this involves an extensive number of treatment options, and is a complaint that carries with it a high risk of complications and morbidity.  The differential diagnosis includes dehydration vs arrhythmia vs hypoglycemia vs electrolyte derangement vs anemia   Co morbidities that complicate the patient evaluation  Dementia Chronic Afib HTN CVA   Additional history obtained:  Additional history obtained from EMS External records from outside source obtained and reviewed including CT head 12/08/22 with chronic, stable findings.   Lab Tests:  I Ordered, and personally interpreted labs.  The pertinent results include:  Labs reassuring w/o acute abnormality   Cardiac Monitoring:  The patient was maintained on a cardiac monitor.  I personally viewed and interpreted the cardiac monitored which showed an underlying rhythm of: rate controlled Afib   Medicines ordered and prescription drug management:  I ordered medication including Seroquel for medication compliance; requested by son  I have reviewed the patients home medicines and have made adjustments as needed   Test Considered:  CT head   Problem List / ED Course:  As above   Reevaluation:  After the interventions noted above, I  reevaluated the patient and found that they have :stayed the same   Social Determinants of Health:  Impaired mobility and ADLs   Dispostion:  After consideration of the diagnostic results and the patients response to treatment, I feel that the patent would benefit from follow up with her PCP. Encouraged full medication compliance. No indication for further emergent w/u at this time. Patient to be discharged in care of her son.          Final Clinical Impression(s) / ED Diagnoses Final diagnoses:  Generalized weakness    Rx / DC Orders ED Discharge Orders     None         Antony Madura, PA-C 01/14/23 2130    Shon Baton, MD 01/14/23 [phone]

## 2023-01-14 NOTE — Discharge Instructions (Signed)
Your workup in the ED was reassuring.  Take your daily medications as prescribed.  Return for new or concerning symptoms.

## 2023-01-14 NOTE — ED Triage Notes (Signed)
Pt presents to ED for evaluation of generalized weakness. Pt is baseline dementia and has not taken her Seroquel tonight. No other complaints.

## 2023-01-19 ENCOUNTER — Telehealth: Payer: Self-pay | Admitting: Internal Medicine

## 2023-01-19 ENCOUNTER — Ambulatory Visit: Payer: Medicare Other | Admitting: Internal Medicine

## 2023-01-19 DIAGNOSIS — I4821 Permanent atrial fibrillation: Secondary | ICD-10-CM

## 2023-01-19 DIAGNOSIS — I951 Orthostatic hypotension: Secondary | ICD-10-CM

## 2023-01-19 NOTE — Telephone Encounter (Signed)
Patient's son called in cancelling the patient's appointment for today stating she had two falls last night causing her not to be able to come in. He reports the patient is going to be placed in [facility] on [address] and they will handle her care from there. He requested Dr. Graciela Husbands be notified of this.

## 2023-02-15 ENCOUNTER — Encounter (HOSPITAL_COMMUNITY): Payer: Self-pay | Admitting: Emergency Medicine

## 2023-02-15 ENCOUNTER — Inpatient Hospital Stay (HOSPITAL_COMMUNITY): Payer: Medicare Other

## 2023-02-15 ENCOUNTER — Emergency Department (HOSPITAL_COMMUNITY): Payer: Medicare Other

## 2023-02-15 ENCOUNTER — Other Ambulatory Visit: Payer: Self-pay

## 2023-02-15 ENCOUNTER — Telehealth: Payer: Self-pay | Admitting: Student

## 2023-02-15 ENCOUNTER — Inpatient Hospital Stay (HOSPITAL_COMMUNITY)
Admission: EM | Admit: 2023-02-15 | Discharge: 2023-02-17 | DRG: 177 | Disposition: A | Discharge status: Skilled Nursing Facility | Payer: Medicare Other | Source: Skilled Nursing Facility | Attending: Internal Medicine | Admitting: Internal Medicine

## 2023-02-15 DIAGNOSIS — R627 Adult failure to thrive: Secondary | ICD-10-CM | POA: Diagnosis present

## 2023-02-15 DIAGNOSIS — Z8673 Personal history of transient ischemic attack (TIA), and cerebral infarction without residual deficits: Secondary | ICD-10-CM | POA: Diagnosis present

## 2023-02-15 DIAGNOSIS — Z515 Encounter for palliative care: Secondary | ICD-10-CM

## 2023-02-15 DIAGNOSIS — Z7901 Long term (current) use of anticoagulants: Secondary | ICD-10-CM | POA: Diagnosis not present

## 2023-02-15 DIAGNOSIS — Z1152 Encounter for screening for COVID-19: Secondary | ICD-10-CM | POA: Diagnosis not present

## 2023-02-15 DIAGNOSIS — J690 Pneumonitis due to inhalation of food and vomit: Secondary | ICD-10-CM | POA: Diagnosis present

## 2023-02-15 DIAGNOSIS — Z66 Do not resuscitate: Secondary | ICD-10-CM | POA: Diagnosis present

## 2023-02-15 DIAGNOSIS — I4819 Other persistent atrial fibrillation: Secondary | ICD-10-CM | POA: Diagnosis not present

## 2023-02-15 DIAGNOSIS — Z823 Family history of stroke: Secondary | ICD-10-CM

## 2023-02-15 DIAGNOSIS — Z91041 Radiographic dye allergy status: Secondary | ICD-10-CM

## 2023-02-15 DIAGNOSIS — F03918 Unspecified dementia, unspecified severity, with other behavioral disturbance: Secondary | ICD-10-CM | POA: Diagnosis present

## 2023-02-15 DIAGNOSIS — J9601 Acute respiratory failure with hypoxia: Secondary | ICD-10-CM | POA: Diagnosis present

## 2023-02-15 DIAGNOSIS — I2729 Other secondary pulmonary hypertension: Secondary | ICD-10-CM | POA: Diagnosis present

## 2023-02-15 DIAGNOSIS — J918 Pleural effusion in other conditions classified elsewhere: Secondary | ICD-10-CM | POA: Diagnosis present

## 2023-02-15 DIAGNOSIS — I5031 Acute diastolic (congestive) heart failure: Secondary | ICD-10-CM | POA: Diagnosis not present

## 2023-02-15 DIAGNOSIS — F01511 Vascular dementia, unspecified severity, with agitation: Secondary | ICD-10-CM | POA: Diagnosis present

## 2023-02-15 DIAGNOSIS — I130 Hypertensive heart and chronic kidney disease with heart failure and stage 1 through stage 4 chronic kidney disease, or unspecified chronic kidney disease: Secondary | ICD-10-CM | POA: Diagnosis present

## 2023-02-15 DIAGNOSIS — R0902 Hypoxemia: Principal | ICD-10-CM

## 2023-02-15 DIAGNOSIS — E559 Vitamin D deficiency, unspecified: Secondary | ICD-10-CM | POA: Diagnosis present

## 2023-02-15 DIAGNOSIS — Z885 Allergy status to narcotic agent status: Secondary | ICD-10-CM | POA: Diagnosis not present

## 2023-02-15 DIAGNOSIS — D631 Anemia in chronic kidney disease: Secondary | ICD-10-CM | POA: Diagnosis present

## 2023-02-15 DIAGNOSIS — J189 Pneumonia, unspecified organism: Secondary | ICD-10-CM | POA: Diagnosis present

## 2023-02-15 DIAGNOSIS — I5082 Biventricular heart failure: Secondary | ICD-10-CM | POA: Diagnosis present

## 2023-02-15 DIAGNOSIS — F0154 Vascular dementia, unspecified severity, with anxiety: Secondary | ICD-10-CM | POA: Diagnosis present

## 2023-02-15 DIAGNOSIS — D638 Anemia in other chronic diseases classified elsewhere: Secondary | ICD-10-CM | POA: Diagnosis present

## 2023-02-15 DIAGNOSIS — N183 Chronic kidney disease, stage 3 unspecified: Secondary | ICD-10-CM | POA: Diagnosis present

## 2023-02-15 DIAGNOSIS — J90 Pleural effusion, not elsewhere classified: Secondary | ICD-10-CM | POA: Diagnosis present

## 2023-02-15 DIAGNOSIS — I639 Cerebral infarction, unspecified: Secondary | ICD-10-CM | POA: Diagnosis present

## 2023-02-15 DIAGNOSIS — Z7189 Other specified counseling: Secondary | ICD-10-CM | POA: Diagnosis not present

## 2023-02-15 DIAGNOSIS — R4589 Other symptoms and signs involving emotional state: Secondary | ICD-10-CM

## 2023-02-15 DIAGNOSIS — Z880 Allergy status to penicillin: Secondary | ICD-10-CM

## 2023-02-15 DIAGNOSIS — Z90710 Acquired absence of both cervix and uterus: Secondary | ICD-10-CM

## 2023-02-15 DIAGNOSIS — Z881 Allergy status to other antibiotic agents status: Secondary | ICD-10-CM

## 2023-02-15 DIAGNOSIS — R296 Repeated falls: Secondary | ICD-10-CM | POA: Diagnosis present

## 2023-02-15 DIAGNOSIS — Z8249 Family history of ischemic heart disease and other diseases of the circulatory system: Secondary | ICD-10-CM

## 2023-02-15 DIAGNOSIS — Z882 Allergy status to sulfonamides status: Secondary | ICD-10-CM

## 2023-02-15 DIAGNOSIS — E785 Hyperlipidemia, unspecified: Secondary | ICD-10-CM | POA: Diagnosis present

## 2023-02-15 DIAGNOSIS — I5033 Acute on chronic diastolic (congestive) heart failure: Secondary | ICD-10-CM | POA: Diagnosis present

## 2023-02-15 DIAGNOSIS — Z79899 Other long term (current) drug therapy: Secondary | ICD-10-CM

## 2023-02-15 DIAGNOSIS — Z8616 Personal history of COVID-19: Secondary | ICD-10-CM | POA: Diagnosis not present

## 2023-02-15 DIAGNOSIS — I3139 Other pericardial effusion (noninflammatory): Secondary | ICD-10-CM | POA: Diagnosis present

## 2023-02-15 DIAGNOSIS — I4821 Permanent atrial fibrillation: Secondary | ICD-10-CM | POA: Diagnosis present

## 2023-02-15 DIAGNOSIS — F01518 Vascular dementia, unspecified severity, with other behavioral disturbance: Secondary | ICD-10-CM | POA: Diagnosis present

## 2023-02-15 DIAGNOSIS — Z888 Allergy status to other drugs, medicaments and biological substances status: Secondary | ICD-10-CM

## 2023-02-15 DIAGNOSIS — Z8542 Personal history of malignant neoplasm of other parts of uterus: Secondary | ICD-10-CM

## 2023-02-15 DIAGNOSIS — I10 Essential (primary) hypertension: Secondary | ICD-10-CM | POA: Diagnosis present

## 2023-02-15 DIAGNOSIS — Z91040 Latex allergy status: Secondary | ICD-10-CM

## 2023-02-15 DIAGNOSIS — R54 Age-related physical debility: Secondary | ICD-10-CM | POA: Diagnosis present

## 2023-02-15 LAB — BASIC METABOLIC PANEL
Anion gap: 10 (ref 5–15)
BUN: 19 mg/dL (ref 8–23)
CO2: 21 mmol/L — ABNORMAL LOW (ref 22–32)
Calcium: 9.1 mg/dL (ref 8.9–10.3)
Chloride: 105 mmol/L (ref 98–111)
Creatinine, Ser: 1.22 mg/dL — ABNORMAL HIGH (ref 0.44–1.00)
GFR, Estimated: 43 mL/min — ABNORMAL LOW (ref >60)
Glucose, Bld: 115 mg/dL — ABNORMAL HIGH (ref 70–99)
Potassium: 4 mmol/L (ref 3.5–5.1)
Sodium: 136 mmol/L (ref 135–145)

## 2023-02-15 LAB — URINALYSIS, ROUTINE W REFLEX MICROSCOPIC
Bilirubin Urine: NEGATIVE
Glucose, UA: NEGATIVE mg/dL
Hgb urine dipstick: NEGATIVE
Ketones, ur: NEGATIVE mg/dL
Nitrite: NEGATIVE
Protein, ur: 30 mg/dL — AB
Specific Gravity, Urine: 1.011 (ref 1.005–1.030)
pH: 5 (ref 5.0–8.0)

## 2023-02-15 LAB — GLUCOSE, PLEURAL OR PERITONEAL FLUID: Glucose, Fluid: 126 mg/dL

## 2023-02-15 LAB — TROPONIN I (HIGH SENSITIVITY)
Troponin I (High Sensitivity): 13 ng/L (ref <18)
Troponin I (High Sensitivity): 17 ng/L (ref <18)

## 2023-02-15 LAB — GRAM STAIN

## 2023-02-15 LAB — CBC WITH DIFFERENTIAL/PLATELET
Abs Immature Granulocytes: 0.01 10*3/uL (ref 0.00–0.07)
Basophils Absolute: 0 10*3/uL (ref 0.0–0.1)
Basophils Relative: 1 %
Eosinophils Absolute: 0.2 10*3/uL (ref 0.0–0.5)
Eosinophils Relative: 3 %
HCT: 36 % (ref 36.0–46.0)
Hemoglobin: 11.1 g/dL — ABNORMAL LOW (ref 12.0–15.0)
Immature Granulocytes: 0 %
Lymphocytes Relative: 22 %
Lymphs Abs: 1.3 10*3/uL (ref 0.7–4.0)
MCH: 29.4 pg (ref 26.0–34.0)
MCHC: 30.8 g/dL (ref 30.0–36.0)
MCV: 95.2 fL (ref 80.0–100.0)
Monocytes Absolute: 0.5 10*3/uL (ref 0.1–1.0)
Monocytes Relative: 8 %
Neutro Abs: 4 10*3/uL (ref 1.7–7.7)
Neutrophils Relative %: 66 %
Platelets: 207 10*3/uL (ref 150–400)
RBC: 3.78 MIL/uL — ABNORMAL LOW (ref 3.87–5.11)
RDW: 15.2 % (ref 11.5–15.5)
WBC: 6 10*3/uL (ref 4.0–10.5)
nRBC: 0 % (ref 0.0–0.2)

## 2023-02-15 LAB — RESP PANEL BY RT-PCR (RSV, FLU A&B, COVID)  RVPGX2
Influenza A by PCR: NEGATIVE
Influenza B by PCR: NEGATIVE
Resp Syncytial Virus by PCR: NEGATIVE
SARS Coronavirus 2 by RT PCR: NEGATIVE

## 2023-02-15 LAB — PROTEIN, PLEURAL OR PERITONEAL FLUID: Total protein, fluid: <3 g/dL

## 2023-02-15 LAB — BODY FLUID CELL COUNT WITH DIFFERENTIAL
Eos, Fluid: 0 %
Lymphs, Fluid: 27 %
Monocyte-Macrophage-Serous Fluid: 73 % (ref 50–90)
Neutrophil Count, Fluid: 0 % (ref 0–25)
Total Nucleated Cell Count, Fluid: 158 cu mm (ref 0–1000)

## 2023-02-15 LAB — BRAIN NATRIURETIC PEPTIDE: B Natriuretic Peptide: 476.8 pg/mL — ABNORMAL HIGH (ref 0.0–100.0)

## 2023-02-15 LAB — LACTATE DEHYDROGENASE, PLEURAL OR PERITONEAL FLUID: LD, Fluid: 45 U/L — ABNORMAL HIGH (ref 3–23)

## 2023-02-15 LAB — STREP PNEUMONIAE URINARY ANTIGEN: Strep Pneumo Urinary Antigen: NEGATIVE

## 2023-02-15 LAB — GLUCOSE, CAPILLARY: Glucose-Capillary: 95 mg/dL (ref 70–99)

## 2023-02-15 LAB — TSH: TSH: 2.092 u[IU]/mL (ref 0.350–4.500)

## 2023-02-15 MED ORDER — FLUOXETINE HCL 10 MG PO CAPS
10.0000 mg | ORAL_CAPSULE | Freq: Every day | ORAL | Status: DC
  Administered 2023-02-15 – 2023-02-17 (×3): 10 mg via ORAL
  Filled 2023-02-15 (×3): qty 1

## 2023-02-15 MED ORDER — SODIUM CHLORIDE 0.9 % IV SOLN
500.0000 mg | Freq: Once | INTRAVENOUS | Status: AC
  Administered 2023-02-15: 500 mg via INTRAVENOUS
  Filled 2023-02-15: qty 5

## 2023-02-15 MED ORDER — HALOPERIDOL LACTATE 5 MG/ML IJ SOLN
0.5000 mg | Freq: Four times a day (QID) | INTRAMUSCULAR | Status: DC | PRN
  Administered 2023-02-15 (×2): 0.5 mg via INTRAVENOUS
  Filled 2023-02-15 (×3): qty 1

## 2023-02-15 MED ORDER — SODIUM CHLORIDE 0.9 % IV SOLN
500.0000 mg | INTRAVENOUS | Status: DC
  Administered 2023-02-16: 500 mg via INTRAVENOUS
  Filled 2023-02-15 (×2): qty 5

## 2023-02-15 MED ORDER — SODIUM CHLORIDE 0.9 % IV SOLN
1.0000 g | Freq: Once | INTRAVENOUS | Status: AC
  Administered 2023-02-15: 1 g via INTRAVENOUS
  Filled 2023-02-15: qty 10

## 2023-02-15 MED ORDER — ONDANSETRON HCL 4 MG PO TABS: 4.0000 mg | ORAL_TABLET | Freq: Four times a day (QID) | ORAL | Status: DC | PRN

## 2023-02-15 MED ORDER — IRBESARTAN 150 MG PO TABS
150.0000 mg | ORAL_TABLET | Freq: Every day | ORAL | Status: DC
  Administered 2023-02-15: 150 mg via ORAL
  Filled 2023-02-15 (×2): qty 1

## 2023-02-15 MED ORDER — AMLODIPINE BESYLATE 5 MG PO TABS
5.0000 mg | ORAL_TABLET | Freq: Once | ORAL | Status: AC
  Administered 2023-02-15: 5 mg via ORAL
  Filled 2023-02-15: qty 1

## 2023-02-15 MED ORDER — BUMETANIDE 1 MG PO TABS
1.0000 mg | ORAL_TABLET | Freq: Two times a day (BID) | ORAL | Status: DC
  Filled 2023-02-15: qty 1

## 2023-02-15 MED ORDER — LIDOCAINE HCL 1 % IJ SOLN
INTRAMUSCULAR | Status: AC
  Filled 2023-02-15: qty 20

## 2023-02-15 MED ORDER — ACETAMINOPHEN 325 MG PO TABS: 650.0000 mg | ORAL_TABLET | Freq: Four times a day (QID) | ORAL | Status: DC | PRN

## 2023-02-15 MED ORDER — BUMETANIDE 0.25 MG/ML IJ SOLN
0.5000 mg | Freq: Once | INTRAMUSCULAR | Status: AC
  Administered 2023-02-15: 0.5 mg via INTRAVENOUS
  Filled 2023-02-15: qty 10

## 2023-02-15 MED ORDER — SODIUM CHLORIDE 0.9 % IV SOLN
2.0000 g | INTRAVENOUS | Status: DC
  Administered 2023-02-16: 2 g via INTRAVENOUS
  Filled 2023-02-15: qty 20

## 2023-02-15 MED ORDER — BUMETANIDE 1 MG PO TABS
1.0000 mg | ORAL_TABLET | Freq: Every day | ORAL | Status: DC
  Administered 2023-02-15: 1 mg via ORAL
  Filled 2023-02-15 (×2): qty 1

## 2023-02-15 MED ORDER — ONDANSETRON HCL 4 MG/2ML IJ SOLN: 4.0000 mg | Freq: Four times a day (QID) | INTRAMUSCULAR | Status: DC | PRN

## 2023-02-15 MED ORDER — HYDRALAZINE HCL 20 MG/ML IJ SOLN
10.0000 mg | INTRAMUSCULAR | Status: DC | PRN
  Administered 2023-02-15: 10 mg via INTRAVENOUS
  Filled 2023-02-15: qty 1

## 2023-02-15 MED ORDER — ACETAMINOPHEN 650 MG RE SUPP: 650.0000 mg | Freq: Four times a day (QID) | RECTAL | Status: DC | PRN

## 2023-02-15 MED ORDER — AMLODIPINE BESYLATE 10 MG PO TABS
10.0000 mg | ORAL_TABLET | Freq: Every day | ORAL | Status: DC
  Administered 2023-02-15 – 2023-02-17 (×3): 10 mg via ORAL
  Filled 2023-02-15 (×3): qty 1

## 2023-02-15 MED ORDER — QUETIAPINE FUMARATE 25 MG PO TABS
50.0000 mg | ORAL_TABLET | Freq: Every day | ORAL | Status: DC
  Administered 2023-02-15: 50 mg via ORAL
  Filled 2023-02-15 (×2): qty 2

## 2023-02-15 MED ORDER — METOPROLOL TARTRATE 50 MG PO TABS
50.0000 mg | ORAL_TABLET | Freq: Two times a day (BID) | ORAL | Status: DC
  Administered 2023-02-15 – 2023-02-17 (×4): 50 mg via ORAL
  Filled 2023-02-15 (×5): qty 1

## 2023-02-15 NOTE — ED Triage Notes (Signed)
  Patient BIB EMS for SOB that started earlier this morning.  EMS states when they arrived patient was 92-93% on RA.  On arrival patient has no complaints but is breathing shallow.  SPO2 95% on RA.  Patient denies any pain.

## 2023-02-15 NOTE — Evaluation (Signed)
SLP Cancellation Note  Patient Details Name: Tiffany Black MRN: [id number] DOB: 10/24/1933   Cancelled treatment:       Reason Eval/Treat Not Completed: Other (comment) (Pt currently being taken off floor for thoracentesis.  RN does report pt coughing more with po more than at baseline.)  Rolena Infante, MS [facility] SLP Acute Rehab Services Office [phone]  Chales Abrahams 02/15/2023, 1:53 PM

## 2023-02-15 NOTE — Procedures (Signed)
Ultrasound-guided diagnostic and therapeutic right thoracentesis performed yielding 1.1 liters of yellow fluid. No immediate complications. Follow-up chest x-ray pending. The fluid was sent to the lab for preordered studies. EBL < 2 cc.

## 2023-02-15 NOTE — ED Provider Notes (Signed)
[place] EMERGENCY DEPARTMENT AT [facility] Provider Note   CSN: [id number] Arrival date & time: 02/15/23  0309     History  Chief Complaint  Patient presents with   Shortness of Breath    Tiffany Black is a 89 y.o. female.  The history is provided by the EMS personnel and medical records. The history is limited by the condition of the patient (level 5 caveat).  Shortness of Breath Severity:  Severe Timing:  Constant Progression:  Worsening Chronicity:  New Context: not URI   Relieved by:  Nothing Worsened by:  Nothing Ineffective treatments:  None tried Associated symptoms: no fever, no rash, no vomiting and no wheezing   Patient with AFIB and dementia presents for Shortness of breath and hypoxia from the nursing home.  She is not normally on oxygen.  No reported fevers.  No reported chest pain nor rashes.      Past Medical History:  Diagnosis Date   Anxiety    Arthritis    BPPV (benign paroxysmal positional vertigo)    Breast lesion    left   Bulging of cervical intervertebral disc    Cardiomegaly    Cataracts, bilateral    Chronic neck pain    CVA (cerebral vascular accident) (HCC)    x2 2013 and 2015; right temporal lobe and left frontal lobe infarct-> dzziness which resolved; occluded right PCA, moderate posterior left cerebral stenosis, left MCA stenosis on MRA   Dementia Tiffany Black)    Essential hypertension    History of endometrial cancer    History of stroke    2015 and 2016 // carotid US 8/16: Bilateral 1-39 // carotid US 2/16: Bilateral ICA 1-39   HLD (hyperlipidemia)    Hx of echocardiogram    a. Echo (11/15): Mild LVH, EF 55-60%, normal wall motion, grade 2 diastolic dysfunction, mild LAE, normal RV function, PASP 29 mm Hg // echo 8/16:Mild concentric LVH, EF 55-60, normal wall motion, grade 2 diastolic dysfunction, mild LAE, PASP 39   Hypertension    IBS (irritable bowel syndrome)    Insomnia    Lung nodule    OA (osteoarthritis)     Pectus excavatum    Persistent atrial fibrillation (HCC)    Pleural effusion    right recurrent, 1/24 s/p thoracentesis x 3 (cytology negative)- Dr Francine Graven   Pneumonia due to COVID-19 virus    Radius fracture    left distal   Vertigo    Vitamin D deficiency      Home Medications Prior to Admission medications   Medication Sig Start Date End Date Taking? Authorizing Provider  acetaminophen (TYLENOL) 325 MG tablet Take 650 mg by mouth every 6 (six) hours as needed for moderate pain.   Yes [provider]  felodipine (PLENDIL) 5 MG 24 hr tablet Take 5 mg by mouth daily. 12/26/22  Yes [provider]  FLUoxetine (PROZAC) 10 MG capsule Take 10 mg by mouth daily.   Yes [provider]  metoprolol tartrate (LOPRESSOR) 50 MG tablet Take 50 mg by mouth 2 (two) times daily. 01/15/23  Yes [provider]  nystatin (MYCOSTATIN/NYSTOP) powder Apply 1 Application topically 2 (two) times daily. 01/11/23  Yes [provider]  potassium chloride SA (KLOR-CON M) 20 MEQ tablet One po bid x 3 days, then take one po once a day Patient taking differently: Take 10 mEq by mouth daily. 12/11/22  Yes Cathren Laine, MD  QUEtiapine (SEROQUEL) 50 MG tablet Take 1 tablet (50  mg total) by mouth at bedtime. 08/15/22  Yes Black, Tiffany Roe, MD  valsartan (DIOVAN) 160 MG tablet Take 80 mg by mouth 2 (two) times daily.   Yes [provider]  cephALEXin (KEFLEX) 500 MG capsule Take 1 capsule (500 mg total) by mouth 4 (four) times daily. Patient not taking: Reported on 02/15/2023 12/08/22   Black, Tiffany Cower, MD  diclofenac Sodium (VOLTAREN) 1 % GEL Apply 2 g topically 4 (four) times daily. To right wrist Patient not taking: Reported on 12/18/2022 10/02/22   Tiffany Hercules, MD  lidocaine (LIDODERM) 5 % Place 1 patch onto the skin daily. Remove & Discard patch within 12 hours or as directed by MD Patient not taking: Reported on 02/15/2023 01/08/23   Tiffany Dibbles, MD  metoprolol tartrate  (LOPRESSOR) 100 MG tablet Take 1 tablet (100 mg total) by mouth 2 (two) times daily. Patient not taking: Reported on 02/15/2023 11/22/22   Tiffany Pigeon, Black-C  Rivaroxaban (XARELTO) 15 MG TABS tablet Take 1 tablet (15 mg total) by mouth daily with supper. Patient not taking: Reported on 12/18/2022 03/06/22   Tiffany Salvia, MD  senna-docusate (SENOKOT-S) 8.6-50 MG tablet Take 1 tablet by mouth at bedtime as needed for mild constipation. Patient not taking: Reported on 02/15/2023 10/02/22   Tiffany Hercules, MD      Allergies    Hydrochlorothiazide, Penicillins, Ciprofloxacin, Doxycycline, Hydrocodone-acetaminophen, Iodinated contrast media, Iodine-131, Lasix [furosemide], Latex, Niaspan [niacin], Statins, Carbamazepine, Codeine, Nickel, Sulfa antibiotics, Sulfamethoxazole, and Sulfonamide derivatives    Review of Systems   Review of Systems  Unable to perform ROS: Dementia  Constitutional:  Negative for fever.  Respiratory:  Positive for shortness of breath. Negative for wheezing.   Gastrointestinal:  Negative for nausea and vomiting.  Skin:  Negative for rash.    Physical Exam Updated Vital Signs BP (!) 205/116 (BP Location: Left Arm)   Pulse 79   Temp 97.9 F (36.6 C) (Oral)   Resp (!) 26   Ht 5\' 5"  (1.651 m)   Wt 59.9 kg   SpO2 95%   BMI 21.97 kg/m  Physical Exam Vitals and nursing note reviewed.  Constitutional:      General: She is not in acute distress.    Appearance: She is well-developed.  HENT:     Head: Normocephalic and atraumatic.     Nose: Nose normal.  Eyes:     Pupils: Pupils are equal, round, and reactive to light.  Cardiovascular:     Rate and Rhythm: Normal rate. Rhythm irregular.     Pulses: Normal pulses.     Heart sounds: Normal heart sounds.  Pulmonary:     Effort: Pulmonary effort is normal. Tachypnea present. No respiratory distress.     Breath sounds: Decreased breath sounds present.  Abdominal:     General: Bowel sounds are normal. There is no  distension.     Palpations: Abdomen is soft.     Tenderness: There is no abdominal tenderness. There is no guarding or rebound.  Genitourinary:    Vagina: No vaginal discharge.  Musculoskeletal:        General: Normal range of motion.     Cervical back: Normal range of motion and neck supple.     Right lower leg: Edema present.     Left lower leg: Edema present.  Skin:    General: Skin is warm and dry.     Capillary Refill: Capillary refill takes less than 2 seconds.     Findings:  No erythema or rash.  Neurological:     General: No focal deficit present.     Mental Status: She is alert.     Deep Tendon Reflexes: Reflexes normal.  Psychiatric:        Mood and Affect: Mood normal.     ED Results / Procedures / Treatments   Labs (all labs ordered are listed, but only abnormal results are displayed) Results for orders placed or performed during the hospital encounter of 02/15/23  Resp panel by RT-PCR (RSV, Flu A&B, Covid) Anterior Nasal Swab   Specimen: Anterior Nasal Swab  Result Value Ref Range   SARS Coronavirus 2 by RT PCR NEGATIVE NEGATIVE   Influenza A by PCR NEGATIVE NEGATIVE   Influenza B by PCR NEGATIVE NEGATIVE   Resp Syncytial Virus by PCR NEGATIVE NEGATIVE  CBC with Differential  Result Value Ref Range   WBC 6.0 4.0 - 10.5 K/uL   RBC 3.78 (L) 3.87 - 5.11 MIL/uL   Hemoglobin 11.1 (L) 12.0 - 15.0 g/dL   HCT 09.8 11.9 - 14.7 %   MCV 95.2 80.0 - 100.0 fL   MCH 29.4 26.0 - 34.0 pg   MCHC 30.8 30.0 - 36.0 g/dL   RDW 82.9 56.2 - 13.0 %   Platelets 207 150 - 400 K/uL   nRBC 0.0 0.0 - 0.2 %   Neutrophils Relative % 66 %   Neutro Abs 4.0 1.7 - 7.7 K/uL   Lymphocytes Relative 22 %   Lymphs Abs 1.3 0.7 - 4.0 K/uL   Monocytes Relative 8 %   Monocytes Absolute 0.5 0.1 - 1.0 K/uL   Eosinophils Relative 3 %   Eosinophils Absolute 0.2 0.0 - 0.5 K/uL   Basophils Relative 1 %   Basophils Absolute 0.0 0.0 - 0.1 K/uL   Immature Granulocytes 0 %   Abs Immature Granulocytes  0.01 0.00 - 0.07 K/uL  Basic metabolic panel  Result Value Ref Range   Sodium 136 135 - 145 mmol/L   Potassium 4.0 3.5 - 5.1 mmol/L   Chloride 105 98 - 111 mmol/L   CO2 21 (L) 22 - 32 mmol/L   Glucose, Bld 115 (H) 70 - 99 mg/dL   BUN 19 8 - 23 mg/dL   Creatinine, Ser 8.65 (H) 0.44 - 1.00 mg/dL   Calcium 9.1 8.9 - 78.4 mg/dL   GFR, Estimated 43 (L) >60 mL/min   Anion gap 10 5 - 15  Brain natriuretic peptide  Result Value Ref Range   B Natriuretic Peptide 476.8 (H) 0.0 - 100.0 pg/mL  Urinalysis, Routine w reflex microscopic -Urine, Clean Catch  Result Value Ref Range   Color, Urine YELLOW YELLOW   APPearance HAZY (A) CLEAR   Specific Gravity, Urine 1.011 1.005 - 1.030   pH 5.0 5.0 - 8.0   Glucose, UA NEGATIVE NEGATIVE mg/dL   Hgb urine dipstick NEGATIVE NEGATIVE   Bilirubin Urine NEGATIVE NEGATIVE   Ketones, ur NEGATIVE NEGATIVE mg/dL   Protein, ur 30 (A) NEGATIVE mg/dL   Nitrite NEGATIVE NEGATIVE   Leukocytes,Ua SMALL (A) NEGATIVE   RBC / HPF 0-5 0 - 5 RBC/hpf   WBC, UA 11-20 0 - 5 WBC/hpf   Bacteria, UA RARE (A) NONE SEEN   Squamous Epithelial / HPF 6-10 0 - 5 /HPF   Mucus PRESENT    Budding Yeast PRESENT    Hyaline Casts, UA PRESENT   Troponin I (High Sensitivity)  Result Value Ref Range   Troponin I (High Sensitivity) 13 <  18 ng/L   DG Chest Portable 1 View  Result Date: 02/15/2023 CLINICAL DATA:  Shortness of breath EXAM: PORTABLE CHEST 1 VIEW COMPARISON:  01/08/2023 FINDINGS: Cardiac shadow is enlarged but stable. Aortic calcifications are again seen. Right-sided pleural effusion is increased in the interval from the prior exam. Underlying consolidation is likely present as well. Left lung is clear. IMPRESSION: Increasing right-sided effusion with underlying consolidation. Electronically Signed   By: Alcide Clever M.D.   On: 02/15/2023 03:53    EKG EKG Interpretation  Date/Time:  Thursday February 15 2023 16:10:96 EDT Ventricular Rate:  62 PR Interval:    QRS  Duration: 99 QT Interval:  417 QTC Calculation: 424 R Axis:   -59 Text Interpretation: Atrial fibrillation Left anterior fascicular block Abnormal R-wave progression, late transition Nonspecific T abnormalities, diffuse leads Confirmed by Nicanor Alcon, Anicia Leuthold ([id number]) on 02/15/2023 5:11:10 AM  Radiology DG Chest Portable 1 View  Result Date: 02/15/2023 CLINICAL DATA:  Shortness of breath EXAM: PORTABLE CHEST 1 VIEW COMPARISON:  01/08/2023 FINDINGS: Cardiac shadow is enlarged but stable. Aortic calcifications are again seen. Right-sided pleural effusion is increased in the interval from the prior exam. Underlying consolidation is likely present as well. Left lung is clear. IMPRESSION: Increasing right-sided effusion with underlying consolidation. Electronically Signed   By: Alcide Clever M.D.   On: 02/15/2023 03:53    Procedures Procedures    Medications Ordered in ED Medications  cefTRIAXone (ROCEPHIN) 1 g in sodium chloride 0.9 % 100 mL IVPB (1 g Intravenous New Bag/Given 02/15/23 0529)  azithromycin (ZITHROMAX) 500 mg in sodium chloride 0.9 % 250 mL IVPB (has no administration in time range)  amLODipine (NORVASC) tablet 5 mg (has no administration in time range)    ED Course/ Medical Decision Making/ A&P                             Medical Decision Making Patient with hypoxia and SOB at the nursing home   Amount and/or Complexity of Data Reviewed Independent Historian: EMS    Details: See above  External Data Reviewed: notes.    Details: Previous notes reviewed  Labs: ordered.    Details: Troponin is negative 13, elevated BNP 476.8, negative covid and flu.  Normal white count 6, low hemoglobin 11.1, normal platelets.  Normal sodium 136, normal potassium 4, elevated creatinine 1.22, urine with yeast present but no overt UTI Radiology: ordered and independent interpretation performed.    Details: PNA and effusion by me  ECG/medicine tests: ordered and independent interpretation performed.  Decision-making details documented in ED Course.  Risk Prescription drug management. Decision regarding hospitalization. Risk Details: PORT score 147 plan admission.  BP treated in the ED.  Patient on 2L [place]    Final Clinical Impression(s) / ED Diagnoses Final diagnoses:  Hypoxia  Pneumonia due to infectious organism, unspecified laterality, unspecified part of lung  Pleural effusion   The patient appears reasonably stabilized for admission considering the current resources, flow, and capabilities available in the ED at this time, and I doubt any other [facility] requiring further screening and/or treatment in the ED prior to admission.  Rx / DC Orders ED Discharge Orders     None         Aidaly Cordner, MD 02/15/23 [phone]

## 2023-02-15 NOTE — Evaluation (Signed)
Clinical/Bedside Swallow Evaluation Patient Details  Name: Tiffany Black MRN: [id number] Date of Birth: 08/08/1934  Today's Date: 02/15/2023 Time: SLP Start Time (ACUTE ONLY): 1635 SLP Stop Time (ACUTE ONLY): 1701 SLP Time Calculation (min) (ACUTE ONLY): 26 min  Past Medical History:  Past Medical History:  Diagnosis Date   Anxiety    Arthritis    BPPV (benign paroxysmal positional vertigo)    Breast lesion    left   Bulging of cervical intervertebral disc    Cardiomegaly    Cataracts, bilateral    Chronic neck pain    CVA (cerebral vascular accident) (HCC)    x2 2013 and 2015; right temporal lobe and left frontal lobe infarct-> dzziness which resolved; occluded right PCA, moderate posterior left cerebral stenosis, left MCA stenosis on MRA   Dementia (HCC)    Essential hypertension    History of endometrial cancer    History of stroke    2015 and 2016 // carotid US 8/16: Bilateral 1-39 // carotid US 2/16: Bilateral ICA 1-39   HLD (hyperlipidemia)    Hx of echocardiogram    a. Echo (11/15): Mild LVH, EF 55-60%, normal wall motion, grade 2 diastolic dysfunction, mild LAE, normal RV function, PASP 29 mm Hg // echo 8/16:Mild concentric LVH, EF 55-60, normal wall motion, grade 2 diastolic dysfunction, mild LAE, PASP 39   Hypertension    IBS (irritable bowel syndrome)    Insomnia    Lung nodule    OA (osteoarthritis)    Pectus excavatum    Persistent atrial fibrillation (HCC)    Pleural effusion    right recurrent, 1/24 s/p thoracentesis x 3 (cytology negative)- Dr Francine Graven   Pneumonia due to COVID-19 virus    Radius fracture    left distal   Vertigo    Vitamin D deficiency    Past Surgical History:  Past Surgical History:  Procedure Laterality Date   ABDOMINAL HYSTERECTOMY  2011   CATARACT EXTRACTION, BILATERAL Bilateral 2009   CESAREAN SECTION  1975   IR THORACENTESIS ASP PLEURAL SPACE W/IMG GUIDE  09/28/2022   IR THORACENTESIS ASP PLEURAL SPACE W/IMG GUIDE  10/23/2022    LAPAROSCOPIC HYSTERECTOMY  2011   THORACENTESIS Right 11/03/2022   Procedure: THORACENTESIS;  Surgeon: Omar Person, MD;  Location[facility] ENDOSCOPY;  Service: Pulmonary;  Laterality: Right;   HPI:  Patient is a 89 year old female with vascular dementia, anxiety, CVA, HTN, history of endometrial cancer, HLP, IBS, persistent atrial fibrillation, recurrent pleural effusion, chronic diastolic CHF presented to ED via EMS from SNF with shortness of breath that started earlier in the morning.  Per EMS, O2 sats were 92 to 93% on RA.  Patient denied any pain, shortness of breath, cough, fevers or chills, nausea. 1.1 liters of fluid removed with thoracentesis.  Pt had a fall and hit her head in 09/2022.  Per imaging:  Pt with There is cervical spondylosis, with  focally prominent anterior osteophytes C5-6 and C6-7, trace  posterior endplate spurring and mild disc osteophyte complexes at  both levels.  Per Critical care medicine pt has had 9 ED visits for falls, UTIs.    Assessment / Plan / Recommendation  Clinical Impression  Patient currently presenting with clinical indications concerning for oral pharyngeal and potentially esophageal dysfunction.  She does have history of chronic basal ganglia CVA, cervical osteophytes and dementia which may contribute to dysphagia.  Also agree with concerns for possible aspiration given recurrent right pleural effusion.  Slight decreased eyebrow elevation noted on  left but uncertain if it was due to diminished effort and patient does not consistently follow directions during evaluation.  Otherwise no focal cranial nerve deficits were apparent.  Congested cough noted prior to intake but increased in frequency after large sequential liquid swallows, especially when swishing and expectorating water after dental brushing lending some credence to concern for aspiration at this time SLP does not deem thicker liquids providing patient with definitive benefit for swallowing nor improved  comfort.  Would advise patient under go MBS if this aligns with her goals of care and she will purchase the patient to allow all adequate instrumental, observation of physiology of patient's swallowing.  Will follow-up tomorrow regarding care plan.  Recommend patient continue diet with general precautions at this time.  No family present at this time to discuss patient's prior level of function but question if she has had some weight loss given recurrent ED visits and medical issues. SLP Visit Diagnosis: Dysphagia, unspecified (R13.10)    Aspiration Risk  Moderate aspiration risk    Diet Recommendation Regular;Thin liquid   Liquid Administration via: Cup;Straw Medication Administration: Whole meds with liquid Supervision: Patient able to self feed Compensations: Slow rate;Small sips/bites Postural Changes: Seated upright at 90 degrees    Other  Recommendations Oral Care Recommendations: Oral care BID    Recommendations for follow up therapy are one component of a multi-disciplinary discharge planning process, led by the attending physician.  Recommendations may be updated based on patient status, additional functional criteria and insurance authorization.  Follow up Recommendations Other (comment) (TBD)      Assistance Recommended at Discharge    Functional Status Assessment Patient has had a recent decline in their functional status and demonstrates the ability to make significant improvements in function in a reasonable and predictable amount of time.  Frequency and Duration min 1 x/week  1 week       Prognosis Prognosis for improved oropharyngeal function: Fair      Swallow Study   General Date of Onset: 02/15/23 HPI: Patient is a 89 year old female with vascular dementia, anxiety, CVA, HTN, history of endometrial cancer, HLP, IBS, persistent atrial fibrillation, recurrent pleural effusion, chronic diastolic CHF presented to ED via EMS from SNF with shortness of breath that  started earlier in the morning.  Per EMS, O2 sats were 92 to 93% on RA.  Patient denied any pain, shortness of breath, cough, fevers or chills, nausea. 1.1 liters of fluid removed with thoracentesis.  Pt had a fall and hit her head in 09/2022.  Per imaging:  Pt with There is cervical spondylosis, with  focally prominent anterior osteophytes C5-6 and C6-7, trace  posterior endplate spurring and mild disc osteophyte complexes at  both levels.  Per Critical care medicine pt has had 9 ED visits for falls, UTIs. Type of Study: Bedside Swallow Evaluation Previous Swallow Assessment: prior clinical eval 08/2022 negative, voice was hoarse at that time Diet Prior to this Study: Regular;Thin liquids (Level 0) Temperature Spikes Noted: No Respiratory Status: Nasal cannula History of Recent Intubation: No Behavior/Cognition: Alert;Pleasant mood Oral Care Completed by SLP: Yes Oral Cavity - Dentition: Adequate natural dentition Vision: Functional for self-feeding Self-Feeding Abilities: Able to feed self Patient Positioning: Upright in bed Baseline Vocal Quality: Hoarse Volitional Cough: Weak Volitional Swallow: Able to elicit    Oral/Motor/Sensory Function Overall Oral Motor/Sensory Function: Within functional limits   Ice Chips Ice chips: Not tested   Thin Liquid Thin Liquid: Impaired Presentation: Straw;Cup;Self Fed Pharyngeal  Phase Impairments:  Cough - Delayed;Cough - Immediate    Nectar Thick Nectar Thick Liquid: Impaired Presentation: Cup;Self Fed Pharyngeal Phase Impairments: Cough - Delayed   Honey Thick Honey Thick Liquid: Not tested   Puree Puree: Impaired Presentation: Self Fed;Spoon Pharyngeal Phase Impairments: Cough - Delayed   Solid     Solid: Impaired Presentation: Self Fed Pharyngeal Phase Impairments: Cough - Delayed Other Comments: Patient with some prolonged mastication and oral holding with solids, you use of water or helpful to ear however resulted in inconsistent cough  after swallow      Chales Abrahams 02/15/2023,5:38 PM Rolena Infante, MS [facility] SLP Acute Rehab Services Office [phone]

## 2023-02-15 NOTE — Progress Notes (Signed)
[facility]                                                                              Tiffany Black, is a 89 y.o. female, DOB - [birth year], ZOX:[id number] Admit date - 02/15/2023    Outpatient Primary MD for the patient is Cleatis Polka., MD  LOS - 0  days  Chief Complaint  Patient presents with   Shortness of Breath       Brief summary   Patient is a 89 year old female with vascular dementia, anxiety, CVA, HTN, history of endometrial cancer, HLP, IBS, persistent atrial fibrillation, recurrent pleural effusion, chronic diastolic CHF presented to ED via EMS from SNF with shortness of breath that started earlier in the morning.  Per EMS, O2 sats were 92 to 93% on RA.  Patient denied any pain, shortness of breath, cough, fevers or chills, nausea.   Assessment & Plan    Principal Problem:   Acute respiratory failure with hypoxia (HCC) likely due to CAP PNA, right-sided pleural effusion, mild acute on chronic diastolic CHF -BNP 476.8, no leukocytosis or fevers.  Chest x-ray showed increasing right-sided effusion with underlying consolidation --O2 sats 96% on 2 L, wean O2 as tolerated  Active Problems:   Recurrent right pleural effusion -Chest x-ray showed increasing right-sided effusion. -Will obtain US guided thoracentesis with with studies     CAP (community acquired pneumonia) -Possible aspiration issues as well given right-sided consolidation and recurrent right pleural effusion -For now continue Zithromax, Rocephin, obtain SLP    Acute on chronic diastolic CHF (congestive heart failure) (HCC) -BNP 476, presented with hypoxia and shortness of breath -2D echo 07/2022 showed EF of 60 to 65%, indeterminate diastolic filling, follow 2D echo -Received Bumex 0.5 mg x 1 this morning, continue Lopressor, ARB, amlodipine 10 mg daily -Noted allergy to furosemide, patient needs to be on diuretic, cardiology consulted  Persistent atrial fibrillation  (HCC) CHA2DS2Vasc is 7, currently not on anticoagulation per [facility] likely due to falls and advanced dementia -Currently on Lopressor 50 mg twice daily  Hypertension, uncontrolled -Resume amlodipine, Avapro, metoprolol 50 mg twice daily    Dementia with behavioral disturbance (HCC) -Continue Seroquel   History of cerebral infarction (HCC) -Currently not on anticoagulation, will place on aspirin 81 mg daily -BP control    Anemia of chronic disease -Hemoglobin baseline ~11 -Currently at baseline, no acute issues   Estimated body mass index is 21.97 kg/m as calculated from the following:   Height as of this encounter: 5\' 5"  (1.651 m).   Weight as of this encounter: 59.9 kg.  Code Status: Full code DVT Prophylaxis:  SCDs Start: 02/15/23 0612   Level of Care: Level of care: Telemetry Family Communication:  Disposition Plan:      Remains inpatient appropriate: Workup in progress   Procedures:    Consultants:   Cardiology  Antimicrobials:   Anti-infectives (From admission, onward)    Start     Dose/Rate Route Frequency Ordered Stop   02/16/23 0600  cefTRIAXone (ROCEPHIN) 2 g in sodium chloride 0.9 % 100 mL IVPB        2 g 200  mL/hr over 30 Minutes Intravenous Every 24 hours 02/15/23 0615 02/20/23 0559   02/16/23 0600  azithromycin (ZITHROMAX) 500 mg in sodium chloride 0.9 % 250 mL IVPB        500 mg 250 mL/hr over 60 Minutes Intravenous Every 24 hours 02/15/23 0615 02/20/23 0559   02/15/23 0500  cefTRIAXone (ROCEPHIN) 1 g in sodium chloride 0.9 % 100 mL IVPB        1 g 200 mL/hr over 30 Minutes Intravenous  Once 02/15/23 0449 02/15/23 0608   02/15/23 0500  azithromycin (ZITHROMAX) 500 mg in sodium chloride 0.9 % 250 mL IVPB        500 mg 250 mL/hr over 60 Minutes Intravenous  Once 02/15/23 0449 02/15/23 0708          Medications  amLODipine  10 mg Oral Daily   FLUoxetine  10 mg Oral Daily   irbesartan  150 mg Oral QHS   metoprolol tartrate  50 mg Oral BID    QUEtiapine  50 mg Oral QHS      Subjective:   Tiffany Black was seen and examined today.  Somewhat confused.  BP elevated.  No acute chest pain, nausea vomiting, abdominal pain.  Objective:   Vitals:   02/15/23 0730 02/15/23 0745 02/15/23 0813 02/15/23 0817  BP: (!) 167/109 (!) 169/87 (!) 189/147 (!) 176/112  Pulse: 65 74 91 82  Resp:   17   Temp:   (!) 97.4 F (36.3 C)   TempSrc:   Oral   SpO2: 92% (!) 85% 96%   Weight:      Height:       No intake or output data in the 24 hours ending 02/15/23 1151   Wt Readings from Last 3 Encounters:  02/15/23 59.9 kg  01/14/23 60 kg  12/26/22 59.9 kg     Exam General: Alert and awake, NAD Cardiovascular:  irregularly irregular Respiratory: Diminished breath sound at the bases R>L Gastrointestinal: Soft, nontender, nondistended, + bowel sounds Ext: no pedal edema bilaterally Neuro: moving all 4 extremities spontaneously Psych: dementia    Data Reviewed:  I have personally reviewed following labs    CBC Lab Results  Component Value Date   WBC 6.0 02/15/2023   RBC 3.78 (L) 02/15/2023   HGB 11.1 (L) 02/15/2023   HCT 36.0 02/15/2023   MCV 95.2 02/15/2023   MCH 29.4 02/15/2023   PLT 207 02/15/2023   MCHC 30.8 02/15/2023   RDW 15.2 02/15/2023   LYMPHSABS 1.3 02/15/2023   MONOABS 0.5 02/15/2023   EOSABS 0.2 02/15/2023   BASOSABS 0.0 02/15/2023     Last metabolic panel Lab Results  Component Value Date   NA 136 02/15/2023   K 4.0 02/15/2023   CL 105 02/15/2023   CO2 21 (L) 02/15/2023   BUN 19 02/15/2023   CREATININE 1.22 (H) 02/15/2023   GLUCOSE 115 (H) 02/15/2023   GFRNONAA 43 (L) 02/15/2023   GFRAA 50 (L) 12/26/2019   CALCIUM 9.1 02/15/2023   PHOS 3.3 09/30/2022   PROT 6.2 (L) 12/26/2022   ALBUMIN 3.5 12/26/2022   BILITOT 0.9 12/26/2022   ALKPHOS 62 12/26/2022   AST 25 12/26/2022   ALT 16 12/26/2022   ANIONGAP 10 02/15/2023    CBG (last 3)  Recent Labs    02/15/23 0814  GLUCAP 95       Coagulation Profile: No results for input(s): "INR", "PROTIME" in the last 168 hours.   Radiology Studies: I have personally reviewed the imaging  studies  DG Chest Portable 1 View  Result Date: 02/15/2023 CLINICAL DATA:  Shortness of breath EXAM: PORTABLE CHEST 1 VIEW COMPARISON:  01/08/2023 FINDINGS: Cardiac shadow is enlarged but stable. Aortic calcifications are again seen. Right-sided pleural effusion is increased in the interval from the prior exam. Underlying consolidation is likely present as well. Left lung is clear. IMPRESSION: Increasing right-sided effusion with underlying consolidation. Electronically Signed   By: Alcide Clever M.D.   On: 02/15/2023 03:53       Jatoria Kneeland M.D. [facility] 02/15/2023, 11:51 AM  Available via Epic secure chat 7am-7pm After 7 pm, please refer to night coverage provider listed on amion.

## 2023-02-15 NOTE — Consult Note (Signed)
NAME:  Tiffany Black, MRN:  [id number], DOB:  1934-07-02, LOS: 0 ADMISSION DATE:  02/15/2023, CONSULTATION DATE:  6/6 REFERRING MD:  Dr Isidoro Donning, CHIEF COMPLAINT:  pleural effusion   History of Present Illness:  89 year old female with complex medical history as below, which is significant for dementia, HFpEF, atrial fib, and pleural effusion. She follows with Dr. Francine Graven in the pulmonary clinic for pleural effusion on the right and has thrice undergone thoracentesis. The first thorac appeared to show and exudate, and the most recent pleural fluid sample was more consistent with a transudative process. That was in April of this year. She has had 9 presentations to the ED since that time for various reasons including falls, weakness, and UTI. Now she is presenting 6/6 to [facility] ED from [facility] with shortness of breath. She was unable to provide any history. Imaging was consistent with recurrent right sided effusion. She was admitted to the hospitalist service for treatment of CAP with antibiotics and was given a dose of diuretic as well. She underwent IR thoracentesis and PCCM was consulted for further recommendations regarding the effusion.   Pertinent  Medical History   has a past medical history of Anxiety, Arthritis, BPPV (benign paroxysmal positional vertigo), Breast lesion, Bulging of cervical intervertebral disc, Cardiomegaly, Cataracts, bilateral, Chronic neck pain, CVA (cerebral vascular accident) (HCC), Dementia (HCC), Essential hypertension, History of endometrial cancer, History of stroke, HLD (hyperlipidemia), echocardiogram, Hypertension, IBS (irritable bowel syndrome), Insomnia, Lung nodule, OA (osteoarthritis), Pectus excavatum, Persistent atrial fibrillation (HCC), Pleural effusion, Pneumonia due to COVID-19 virus, Radius fracture, Vertigo, and Vitamin D deficiency.   Significant Hospital Events: Including procedures, antibiotic start and stop dates in addition to other pertinent events   6/6 admit,  thora  Interim History / Subjective:    Objective   Blood pressure (!) 142/82, pulse 71, temperature 97.6 F (36.4 C), temperature source Oral, resp. rate 19, height 5\' 5"  (1.651 m), weight 59.9 kg, SpO2 99 %.       No intake or output data in the 24 hours ending 02/15/23 1354 Filed Weights   02/15/23 0319  Weight: 59.9 kg    Examination: General: Elderly female in NAD HENT: [place]/AT, PERRL, no JVD Lungs: Clear bilateral breath sounds Cardiovascular: RRR, no MRG Abdomen: Soft, NT, ND Extremities: 1+ pitting edema to bilateral lower extremities Neuro: Awake, alert, disoriented  Resolved Hospital Problem list     Assessment & Plan:    Pleural effusion on the right: Her initial thoracentesis appeared exudative, but more recent taps have been transudative. Now she's here with recurrent effusion and is s/p thoracentesis for 1.1L 6/6. Fluid studies are pending. May still have some room for diuresis, but will defer to cardiology. Worry about low oncotic pressures and poor nutrition in the setting of advanced dementia.  - Follow pleural fluid studies - Diuresis per cardiology/primary - 2 months between taps, no role for Pleurx - Pleurodesis unlikely to be successful and would be quite uncomfortable for her. D/w son Thayer Ohm. Hold off.  - Would pursue optimizing volume status and nutritional improvement. Re-evaluate options if effusion recurs.   - Of course, if the fluid is not a transudate as expected, it may change the plan a bit.  - Will arrange outpatient follow up in the the next few weeks. - Palliative approach would not be unreasonable either.   CAP HFpEF Dementia - per primary/cardiology    Labs   CBC: Recent Labs  Lab 02/15/23 0339  WBC 6.0  NEUTROABS  4.0  HGB 11.1*  HCT 36.0  MCV 95.2  PLT 207    Basic Metabolic Panel: Recent Labs  Lab 02/15/23 0339  NA 136  K 4.0  CL 105  CO2 21*  GLUCOSE 115*  BUN 19  CREATININE 1.22*  CALCIUM 9.1    GFR: Estimated Creatinine Clearance: 29.2 mL/min (A) (by C-G formula based on SCr of 1.22 mg/dL (H)). Recent Labs  Lab 02/15/23 0339  WBC 6.0    Liver Function Tests: No results for input(s): "AST", "ALT", "ALKPHOS", "BILITOT", "PROT", "ALBUMIN" in the last 168 hours. No results for input(s): "LIPASE", "AMYLASE" in the last 168 hours. No results for input(s): "AMMONIA" in the last 168 hours.  ABG    Component Value Date/Time   HCO3 23.3 08/10/2022 0544   TCO2 25 10/20/2022 2129   O2SAT 95 08/10/2022 0544     Coagulation Profile: No results for input(s): "INR", "PROTIME" in the last 168 hours.  Cardiac Enzymes: No results for input(s): "CKTOTAL", "CKMB", "CKMBINDEX", "TROPONINI" in the last 168 hours.  HbA1C: Hgb A1c MFr Bld  Date/Time Value Ref Range Status  08/10/2022 05:39 AM 5.7 (H) 4.8 - 5.6 % Final    Comment:    (NOTE)         Prediabetes: 5.7 - 6.4         Diabetes: >6.4         Glycemic control for adults with diabetes: <7.0   12/19/2016 03:30 PM 5.5 <5.7 % Final    Comment:      For the purpose of screening for the presence of diabetes:   <5.7%       Consistent with the absence of diabetes 5.7-6.4 %   Consistent with increased risk for diabetes (prediabetes) >=6.5 %     Consistent with diabetes   This assay result is consistent with a decreased risk of diabetes.   Currently, no consensus exists regarding use of hemoglobin A1c for diagnosis of diabetes in children.   According to American Diabetes Association (ADA) guidelines, hemoglobin A1c <7.0% represents optimal control in non-pregnant diabetic patients. Different metrics may apply to specific patient populations. Standards of Medical Care in Diabetes (ADA).       CBG: Recent Labs  Lab 02/15/23 0814  GLUCAP 95    Review of Systems:   Patient is encephalopathic and/or intubated; therefore, history has been obtained from chart review.    Past Medical History:  She,  has a past  medical history of Anxiety, Arthritis, BPPV (benign paroxysmal positional vertigo), Breast lesion, Bulging of cervical intervertebral disc, Cardiomegaly, Cataracts, bilateral, Chronic neck pain, CVA (cerebral vascular accident) (HCC), Dementia (HCC), Essential hypertension, History of endometrial cancer, History of stroke, HLD (hyperlipidemia), echocardiogram, Hypertension, IBS (irritable bowel syndrome), Insomnia, Lung nodule, OA (osteoarthritis), Pectus excavatum, Persistent atrial fibrillation (HCC), Pleural effusion, Pneumonia due to COVID-19 virus, Radius fracture, Vertigo, and Vitamin D deficiency.   Surgical History:   Past Surgical History:  Procedure Laterality Date   ABDOMINAL HYSTERECTOMY  2011   CATARACT EXTRACTION, BILATERAL Bilateral 2009   CESAREAN SECTION  1975   IR THORACENTESIS ASP PLEURAL SPACE W/IMG GUIDE  09/28/2022   IR THORACENTESIS ASP PLEURAL SPACE W/IMG GUIDE  10/23/2022   LAPAROSCOPIC HYSTERECTOMY  2011   THORACENTESIS Right 11/03/2022   Procedure: THORACENTESIS;  Surgeon: Omar Person, MD;  Location: [facility] ENDOSCOPY;  Service: Pulmonary;  Laterality: Right;     Social History:   reports that she has never smoked. She has never used smokeless  tobacco. She reports that she does not drink alcohol and does not use drugs.   Family History:  Her family history includes CAD in her brother; Dementia in her sister; Heart attack in her father; Heart disease in her father; Stroke in her maternal uncle and mother. There is no history of Hypertension.   Allergies Allergies  Allergen Reactions   Hydrochlorothiazide Hives   Penicillins Hives    Has patient had a PCN reaction causing immediate rash, facial/tongue/throat swelling, SOB or lightheadedness with hypotension: Yes Has patient had a PCN reaction causing severe rash involving mucus membranes or skin necrosis: No Has patient had a PCN reaction that required hospitalization No Has patient had a PCN reaction occurring  within the last 10 years: No If all of the above answers are "NO", then may proceed with Cephalosporin use.    Ciprofloxacin Other (See Comments) and Hypertension    Blood pressure issues- patient doesn't recall if it increased OR decreased it     Doxycycline Diarrhea and Nausea And Vomiting   Hydrocodone-Acetaminophen Other (See Comments)    Unknown reaction   Iodinated Contrast Media Diarrhea, Other (See Comments) and Hypertension    Elevated the B/P   Iodine-131 Other (See Comments) and Hypertension    Raised the blood pressure   Lasix [Furosemide] Other (See Comments)    No reaction listed on order summary report from [facility]   Latex Itching   Niaspan [Niacin]     Flu (from [facility] notes)   Statins Other (See Comments)    Muscle weakness   Carbamazepine Other (See Comments) and Rash    Flushed blood pressure medication out of system   Codeine Nausea And Vomiting   Nickel Rash   Sulfa Antibiotics Hives and Rash   Sulfamethoxazole Hives and Rash   Sulfonamide Derivatives Hives and Rash          Home Medications  Prior to Admission medications   Medication Sig Start Date End Date Taking? Authorizing Provider  acetaminophen (TYLENOL) 325 MG tablet Take 650 mg by mouth every 6 (six) hours as needed for moderate pain.   Yes [provider]  felodipine (PLENDIL) 5 MG 24 hr tablet Take 5 mg by mouth daily. 12/26/22  Yes [provider]  FLUoxetine (PROZAC) 10 MG capsule Take 10 mg by mouth daily.   Yes [provider]  metoprolol tartrate (LOPRESSOR) 50 MG tablet Take 50 mg by mouth 2 (two) times daily. 01/15/23  Yes [provider]  nystatin (MYCOSTATIN/NYSTOP) powder Apply 1 Application topically 2 (two) times daily. 01/11/23  Yes [provider]  potassium chloride SA (KLOR-CON M) 20 MEQ tablet One po bid x 3 days, then take one po once a day Patient taking differently: Take 10 mEq by mouth daily. 12/11/22  Yes Cathren Laine, MD  QUEtiapine  (SEROQUEL) 50 MG tablet Take 1 tablet (50 mg total) by mouth at bedtime. 08/15/22  Yes Elgergawy, Leana Roe, MD  valsartan (DIOVAN) 160 MG tablet Take 80 mg by mouth 2 (two) times daily.   Yes [provider]  cephALEXin (KEFLEX) 500 MG capsule Take 1 capsule (500 mg total) by mouth 4 (four) times daily. Patient not taking: Reported on 02/15/2023 12/08/22   Mesner, Barbara Cower, MD  diclofenac Sodium (VOLTAREN) 1 % GEL Apply 2 g topically 4 (four) times daily. To right wrist Patient not taking: Reported on 12/18/2022 10/02/22   Almon Hercules, MD  lidocaine (LIDODERM) 5 % Place 1 patch onto the skin daily.  Remove & Discard patch within 12 hours or as directed by MD Patient not taking: Reported on 02/15/2023 01/08/23   Linwood Dibbles, MD  metoprolol tartrate (LOPRESSOR) 100 MG tablet Take 1 tablet (100 mg total) by mouth 2 (two) times daily. Patient not taking: Reported on 02/15/2023 11/22/22   Sheilah Pigeon, PA-C  Rivaroxaban (XARELTO) 15 MG TABS tablet Take 1 tablet (15 mg total) by mouth daily with supper. Patient not taking: Reported on 12/18/2022 03/06/22   Duke Salvia, MD  senna-docusate (SENOKOT-S) 8.6-50 MG tablet Take 1 tablet by mouth at bedtime as needed for mild constipation. Patient not taking: Reported on 02/15/2023 10/02/22   Almon Hercules, MD     Critical care time:      Joneen Roach, AGACNP-BC [facility]  See Amion for personal pager PCCM on call pager [phone] until 7pm. Please call Elink 7p-7a. [phone]  02/15/2023 3:25 PM

## 2023-02-15 NOTE — Consult Note (Signed)
Cardiology Consultation   Patient ID: Tatyiana Eaton MRN: [id number]; DOB: 1934/03/18  Admit date: 02/15/2023 Date of Consult: 02/15/2023  PCP:  Cleatis Polka., MD   [facility] Providers Cardiologist:  None  Cardiology APP:  Beatrice Lecher, PA-C  Electrophysiologist:  Sherryl Manges, MD       Patient Profile:   Koryna Luebbers is a 89 y.o. female with a history of permanent atrial fibrillation on Xarelto, multiple CVA, hypertension with orthostatic hypotension, hyperlipidemia, CKD stage III,  IBS,  BPPV, and dementia who is being seen 02/15/2023 for the evaluation of possible CHF at the request of Dr. Maisie Fus.  History of Present Illness:   Ms. Cryan is a 89 year old with the above history who is followed by Dr. Graciela Husbands.  Patient has had multiple ER visits/ admissions since 07/2022 for a variety of issues including altered mental status, COVID, falls, pneumonia, current right pleural effusion requiring thoracentesis, weakness, and progressive dementia. Last Echo in  07/2022 showed LVEF of 60-65% with mild LVH and indeterminate diastolic parameters, normal RV, and no significant valvular disease.  She presented to the ED today via EMS for further evaluation of shortness of breath. EKG showed atrial fibrillation, rate 62 bpm, with underlying baseline wandering but no acute ischemic changes.  High-sensitivity troponin negative x 2. BNP elevated at 476.  Chest x-ray shows increasing right-sided pleural effusion.  She apparently had a recurrent right-sided pleural effusion.  She has had several thoracenteses.  She follows with Dr. Francine Graven in the pulmonary clinic.  Last right-sided thoracentesis was on 11/03/2022.  1200 cc of fluid was removed.  Cytology was negative.  Appears to be transudative.  It appears she has not seen pulmonary since that time.  She has had multiple ER visits with multiple complaints.  The patient is not able to provide accurate history.  She reports she is here  because she is sick.  She denies any chest pains or shortness of breath.  Absent breath sounds noted in the right lung fields.  Cardiology was consulted for possible congestive heart failure.  She has a allergy to Lasix.   Past Medical History:  Diagnosis Date   Anxiety    Arthritis    BPPV (benign paroxysmal positional vertigo)    Breast lesion    left   Bulging of cervical intervertebral disc    Cardiomegaly    Cataracts, bilateral    Chronic neck pain    CVA (cerebral vascular accident) (HCC)    x2 2013 and 2015; right temporal lobe and left frontal lobe infarct-> dzziness which resolved; occluded right PCA, moderate posterior left cerebral stenosis, left MCA stenosis on MRA   Dementia [facility])    Essential hypertension    History of endometrial cancer    History of stroke    2015 and 2016 // carotid US 8/16: Bilateral 1-39 // carotid US 2/16: Bilateral ICA 1-39   HLD (hyperlipidemia)    Hx of echocardiogram    a. Echo (11/15): Mild LVH, EF 55-60%, normal wall motion, grade 2 diastolic dysfunction, mild LAE, normal RV function, PASP 29 mm Hg // echo 8/16:Mild concentric LVH, EF 55-60, normal wall motion, grade 2 diastolic dysfunction, mild LAE, PASP 39   Hypertension    IBS (irritable bowel syndrome)    Insomnia    Lung nodule    OA (osteoarthritis)    Pectus excavatum    Persistent atrial fibrillation (HCC)    Pleural effusion    right recurrent, 1/24 s/p  thoracentesis x 3 (cytology negative)- Dr Francine Graven   Pneumonia due to COVID-19 virus    Radius fracture    left distal   Vertigo    Vitamin D deficiency     Past Surgical History:  Procedure Laterality Date   ABDOMINAL HYSTERECTOMY  2011   CATARACT EXTRACTION, BILATERAL Bilateral 2009   CESAREAN SECTION  1975   IR THORACENTESIS ASP PLEURAL SPACE W/IMG GUIDE  09/28/2022   IR THORACENTESIS ASP PLEURAL SPACE W/IMG GUIDE  10/23/2022   LAPAROSCOPIC HYSTERECTOMY  2011   THORACENTESIS Right 11/03/2022   Procedure:  THORACENTESIS;  Surgeon: Omar Person, MD;  Location: [facility] END[facility];  Service: Pulmonary;  Laterality: Right;     Home Medications:  Prior to Admission medications   Medication Sig Start Date End Date Taking? Authorizing Provider  acetaminophen (TYLENOL) 325 MG tablet Take 650 mg by mouth every 6 (six) hours as needed for moderate pain.   Yes [provider]  felodipine (PLENDIL) 5 MG 24 hr tablet Take 5 mg by mouth daily. 12/26/22  Yes [provider]  FLUoxetine (PROZAC) 10 MG capsule Take 10 mg by mouth daily.   Yes [provider]  metoprolol tartrate (LOPRESSOR) 50 MG tablet Take 50 mg by mouth 2 (two) times daily. 01/15/23  Yes [provider]  nystatin (MYCOSTATIN/NYSTOP) powder Apply 1 Application topically 2 (two) times daily. 01/11/23  Yes [provider]  potassium chloride SA (KLOR-CON M) 20 MEQ tablet One po bid x 3 days, then take one po once a day Patient taking differently: Take 10 mEq by mouth daily. 12/11/22  Yes Cathren Laine, MD  QUEtiapine (SEROQUEL) 50 MG tablet Take 1 tablet (50 mg total) by mouth at bedtime. 08/15/22  Yes Elgergawy, Leana Roe, MD  valsartan (DIOVAN) 160 MG tablet Take 80 mg by mouth 2 (two) times daily.   Yes [provider]  cephALEXin (KEFLEX) 500 MG capsule Take 1 capsule (500 mg total) by mouth 4 (four) times daily. Patient not taking: Reported on 02/15/2023 12/08/22   Mesner, Barbara Cower, MD  diclofenac Sodium (VOLTAREN) 1 % GEL Apply 2 g topically 4 (four) times daily. To right wrist Patient not taking: Reported on 12/18/2022 10/02/22   Almon Hercules, MD  lidocaine (LIDODERM) 5 % Place 1 patch onto the skin daily. Remove & Discard patch within 12 hours or as directed by MD Patient not taking: Reported on 02/15/2023 01/08/23   Linwood Dibbles, MD  metoprolol tartrate (LOPRESSOR) 100 MG tablet Take 1 tablet (100 mg total) by mouth 2 (two) times daily. Patient not taking: Reported on 02/15/2023 11/22/22   Sheilah Pigeon,  PA-C  Rivaroxaban (XARELTO) 15 MG TABS tablet Take 1 tablet (15 mg total) by mouth daily with supper. Patient not taking: Reported on 12/18/2022 03/06/22   Duke Salvia, MD  senna-docusate (SENOKOT-S) 8.6-50 MG tablet Take 1 tablet by mouth at bedtime as needed for mild constipation. Patient not taking: Reported on 02/15/2023 10/02/22   Almon Hercules, MD    Inpatient Medications: Scheduled Meds:  amLODipine  10 mg Oral Daily   FLUoxetine  10 mg Oral Daily   irbesartan  150 mg Oral QHS   metoprolol tartrate  50 mg Oral BID   QUEtiapine  50 mg Oral QHS   Continuous Infusions:  [START ON 02/16/2023] azithromycin     [START ON 02/16/2023] cefTRIAXone (ROCEPHIN)  IV     PRN Meds: acetaminophen **OR** acetaminophen, hydrALAZINE, ondansetron **OR** ondansetron (ZOFRAN) IV  Allergies:  Allergies  Allergen Reactions   Hydrochlorothiazide Hives   Penicillins Hives    Has patient had a PCN reaction causing immediate rash, facial/tongue/throat swelling, SOB or lightheadedness with hypotension: Yes Has patient had a PCN reaction causing severe rash involving mucus membranes or skin necrosis: No Has patient had a PCN reaction that required hospitalization No Has patient had a PCN reaction occurring within the last 10 years: No If all of the above answers are "NO", then may proceed with Cephalosporin use.    Ciprofloxacin Other (See Comments) and Hypertension    Blood pressure issues- patient doesn't recall if it increased OR decreased it     Doxycycline Diarrhea and Nausea And Vomiting   Hydrocodone-Acetaminophen Other (See Comments)    Unknown reaction   Iodinated Contrast Media Diarrhea, Other (See Comments) and Hypertension    Elevated the B/P   Iodine-131 Other (See Comments) and Hypertension    Raised the blood pressure   Lasix [Furosemide] Other (See Comments)    No reaction listed on order summary report from [facility]   Latex Itching   Niaspan [Niacin]     Flu (from [facility] notes)    Statins Other (See Comments)    Muscle weakness   Carbamazepine Other (See Comments) and Rash    Flushed blood pressure medication out of system   Codeine Nausea And Vomiting   Nickel Rash   Sulfa Antibiotics Hives and Rash   Sulfamethoxazole Hives and Rash   Sulfonamide Derivatives Hives and Rash         Social History:   Social History   Socioeconomic History   Marital status: Widowed    Spouse name: Not on file   Number of children: 3   Years of education: 72   Highest education level: Not on file  Occupational History   Occupation: RetiredTherapist, nutritional  Tobacco Use   Smoking status: Never   Smokeless tobacco: Never  Vaping Use   Vaping Use: Never used  Substance and Sexual Activity   Alcohol use: No   Drug use: No   Sexual activity: Never  Other Topics Concern   Not on file  Social History Narrative   Diet: N/A   Caffeine: N/A   Married, if yes what year: Widow, married 1956   Do you live in a house, apartment, assisted living, condo, trailer, ect: House, one stories, 2 persons   Pets: None   Highest level of education: 12th Grade    Current/Past profession: Diplomatic Services operational officer    Exercise: Yes, walking everyday    Right handed   Living Will:    DNR:   POA/HPOA:   Functional Status:   Do you have difficulty bathing or dressing yourself? No   Do you have difficulty preparing food or eating? No    Do you have difficulty managing your medications? No   Do you have difficulty managing your finances? No    Do you have difficulty affording your medications? Yes   Social Determinants of Health   Financial Resource Strain: Not on file  Food Insecurity: No Food Insecurity (10/22/2022)   Hunger Vital Sign    Worried About Running Out of Food in the Last Year: Never true    Ran Out of Food in the Last Year: Never true  Transportation Needs: No Transportation Needs (10/22/2022)   PRAPARE - Administrator, Civil Service (Medical): No    Lack of Transportation  (Non-Medical): No  Physical Activity: Not on file  Stress: Not on  file  Social Connections: Not on file  Intimate Partner Violence: Not At Risk (10/22/2022)   Humiliation, Afraid, Rape, and Kick questionnaire    Fear of Current or Ex-Partner: No    Emotionally Abused: No    Physically Abused: No    Sexually Abused: No    Family History:    Family History  Problem Relation Age of Onset   Stroke Mother    Heart attack Father    Heart disease Father    Dementia Sister    CAD Brother    Stroke Maternal Uncle    Hypertension Neg Hx      ROS:  Please see the history of present illness.   All other ROS reviewed and negative.     Physical Exam/Data:   Vitals:   02/15/23 0730 02/15/23 0745 02/15/23 0813 02/15/23 0817  BP: (!) 167/109 (!) 169/87 (!) 189/147 (!) 176/112  Pulse: 65 74 91 82  Resp:   17   Temp:   (!) 97.4 F (36.3 C)   TempSrc:   Oral   SpO2: 92% (!) 85% 96%   Weight:      Height:       No intake or output data in the 24 hours ending 02/15/23 1216    02/15/2023    3:19 AM 01/14/2023    3:11 AM 12/26/2022    8:32 AM  Last 3 Weights  Weight (lbs) 132 lb 132 lb 4.4 oz 132 lb  Weight (kg) 59.875 kg 60 kg 59.875 kg     Body mass index is 21.97 kg/m.  General: Frail, ill-appearing HEENT: normal Neck: JVD 5 to 7 cm of water Vascular: No carotid bruits; Distal pulses 2+ bilaterally Cardiac:  normal S1, S2; irregular rhythm, no murmurs Lungs: Absent breath sounds in the right lung fields Abd: soft, nontender, no hepatomegaly  Ext: no edema Musculoskeletal:  No deformities, BUE and BLE strength normal and equal Skin: warm and dry  Neuro:  CNs 2-12 intact, no focal abnormalities noted Psych:  Normal affect   EKG:  The EKG was personally reviewed and demonstrates:  Afib 62 bpm, no acute changes  Telemetry:  Telemetry was personally reviewed and demonstrates:  Afib 60s  Relevant CV Studies: TTE 08/10/2022  1. Left ventricular ejection fraction, by estimation,  is 60 to 65%. The  left ventricle has normal function. The left ventricle has no regional  wall motion abnormalities. There is mild left ventricular hypertrophy.  Indeterminate diastolic filling due to  E-A fusion.   2. Right ventricular systolic function is normal. The right ventricular  size is normal. There is normal pulmonary artery systolic pressure. The  estimated right ventricular systolic pressure is 35.3 mmHg.   3. No evidence of mitral valve regurgitation.   4. Aortic valve regurgitation is not visualized. Aortic valve  sclerosis/calcification is present, without any evidence of aortic  stenosis.   5. The inferior vena cava is normal in size with greater than 50%  respiratory variability, suggesting right atrial pressure of 3 mmHg.   Laboratory Data:  High Sensitivity Troponin:   Recent Labs  Lab 02/15/23 0339 02/15/23 0529  TROPONINIHS 13 17     Chemistry Recent Labs  Lab 02/15/23 0339  NA 136  K 4.0  CL 105  CO2 21*  GLUCOSE 115*  BUN 19  CREATININE 1.22*  CALCIUM 9.1  GFRNONAA 43*  ANIONGAP 10    No results for input(s): "PROT", "ALBUMIN", "AST", "ALT", "ALKPHOS", "BILITOT" in the last 168 hours.  Lipids No results for input(s): "CHOL", "TRIG", "HDL", "LABVLDL", "LDLCALC", "CHOLHDL" in the last 168 hours.  Hematology Recent Labs  Lab 02/15/23 0339  WBC 6.0  RBC 3.78*  HGB 11.1*  HCT 36.0  MCV 95.2  MCH 29.4  MCHC 30.8  RDW 15.2  PLT 207   Thyroid  Recent Labs  Lab 02/15/23 0829  TSH 2.092    BNP Recent Labs  Lab 02/15/23 0340  BNP 476.8*    DDimer No results for input(s): "DDIMER" in the last 168 hours.   Radiology/Studies:  DG Chest Portable 1 View  Result Date: 02/15/2023 CLINICAL DATA:  Shortness of breath EXAM: PORTABLE CHEST 1 VIEW COMPARISON:  01/08/2023 FINDINGS: Cardiac shadow is enlarged but stable. Aortic calcifications are again seen. Right-sided pleural effusion is increased in the interval from the prior exam. Underlying  consolidation is likely present as well. Left lung is clear. IMPRESSION: Increasing right-sided effusion with underlying consolidation. Electronically Signed   By: Alcide Clever M.D.   On: 02/15/2023 03:53     Assessment and Plan:   # SOB # Recurrent R Pleural Effusion  # HFpEF -Unclear how much of this is actually related to her recurrent right-sided pleural effusion versus HFpEF.  Echocardiogram in November 2023 showed normal LV function and normal pulmonary pressures.  Will repeat her echocardiogram. -She does not appear overtly volume overloaded.  She did receive 0.5 mg of IV bumetanide as a one-time dose.  There is Lasix listed as an allergy but she seems to have tolerated Bumex well.  Would recommend Bumex 1 mg as a standing dose.  I do not believe she needs IV diuretic therapy at this time. -I think she likely needs recurrent right-sided thoracentesis.  Likely should have pulmonary consultation for consideration of Pleurx catheter. -Also she has had multiple admissions to the hospital for altered mental status, weakness, shortness of breath.  She has advanced dementia.  She is quite unaware of any medical problems on my interview.  She may benefit from palliative care discussion and aggressive goals of care conversation. -Agree with antibiotics as well.  # Persistent Afib -Rate controlled on metoprolol.  Has been off anticoagulation as pulmonary believed anticoagulation could be contributing to her right-sided pleural effusion.  She has advanced dementia and frequent falls.  Unsure if anticoagulation is indicated regardless of right sided pleural effusion.  Would recommend to hold for now.  # Hypertension -BP severely elevated.  Continue home amlodipine 10 mg daily, irbesartan 150 mg daily, hydralazine as needed.  Can further titrate medications as you are able.   For questions or updates, please contact [facility] Please consult [email or web address] for contact info under   [employer]. Flora Lipps, MD, [facility]  [name]  [address], [address] [place], [place] [address] [phone]  1:21 PM

## 2023-02-15 NOTE — TOC Initial Note (Signed)
Transition of Care [facility]) - Initial/Assessment Note    Patient Details  Name: Tiffany Black MRN: [id number] Date of Birth: August 02, 1934  [facility]) CM/SW Contact:    Otelia Santee, LCSW Phone Number: 02/15/2023, 3:09 PM  Clinical Narrative:                 Pt from [facility] [facility]. Pt has RW and is currently receiving PT/OT services at facility. Pt's son provided contact information for [facility] [place] [phone]). CSW will continue to follow for discharge planning.   Expected Discharge Plan: Memory Care Barriers to Discharge: Continued Medical Work up   Patient Goals and CMS Choice Patient states their goals for this hospitalization and ongoing recovery are:: To return to [facility].gov Compare Post Acute Care list provided to:: Legal Guardian Choice offered to / list presented to : [facility] POA / Guardian, Adult Children [place] ownership interest in [facility].provided to:: [facility] POA / Guardian    Expected Discharge Plan and Services In-house Referral: NA Discharge Planning Services: NA Post Acute Care Choice: Resumption of Svcs/PTA Provider Living arrangements for the past 2 months: Assisted Living Facility                 DME Arranged: N/A DME Agency: NA                  Prior Living Arrangements/Services Living arrangements for the past 2 months: Assisted Living Facility Lives with:: Facility Resident Patient language and need for interpreter reviewed:: Yes Do you feel safe going back to the place where you live?: Yes      Need for Family Participation in Patient Care: Yes (Comment) Care giver support system in place?: Yes (comment) Current home services: DME (RW) Criminal Activity/Legal Involvement Pertinent to Current Situation/Hospitalization: No - Comment as needed  Activities of Daily Living      Permission Sought/Granted Permission sought to share information with : Facility Social worker, Family Supports Permission granted to share information with : Yes, Verbal Permission Granted  Share Information with NAME: Litzzy Wain  Permission granted to share info w AGENCY: [facility]  Permission granted to share info w Relationship: Son  Permission granted to share info w Contact Information: [phone]  Emotional Assessment   Attitude/Demeanor/Rapport: Unable to Assess Affect (typically observed): Unable to Assess Orientation: : Oriented to Self, Oriented to Situation Alcohol / Substance Use: Not Applicable Psych Involvement: No (comment)  Admission diagnosis:  Pleural effusion [J90] Hypoxia [R09.02] CAP (community acquired pneumonia) [J18.9] Pneumonia due to infectious organism, unspecified laterality, unspecified part of lung [J18.9] Patient Active Problem List   Diagnosis Date Noted   CAP (community acquired pneumonia) 02/15/2023   Acute on chronic diastolic CHF (congestive heart failure) (HCC) 02/15/2023   Acute anemia 10/20/2022   Pain and swelling of right wrist 10/02/2022   Shortness of breath 09/29/2022   Multiple fractures of ribs, right side, subsequent encounter for fracture with routine healing 09/29/2022   Goals of care, counseling/discussion 09/29/2022   Recurrent right pleural effusion 09/27/2022   Dementia with behavioral disturbance (HCC) 09/27/2022   Lung nodule 09/27/2022   COVID-19 virus infection 08/27/2022   Acute respiratory failure with hypoxia (HCC) 08/27/2022   Generalized weakness 08/27/2022   Fall 08/27/2022   Hyponatremia 08/27/2022   Stage 3a chronic kidney disease (CKD) (HCC) 08/27/2022   Anemia of chronic disease 08/27/2022   Acute encephalopathy 08/10/2022   GAD (generalized anxiety disorder) 08/10/2022   TIA (  transient ischemic attack) 08/10/2022   Aphasia 08/09/2022   Atrial fibrillation with RVR (HCC) 09/07/2021   Headache syndrome 07/02/2017   Chest pain 05/23/2015   Essential hypertension    History  of stroke 05/10/2015   Dizziness 05/10/2015   Orthostatic hypotension 05/10/2015   Cerebral infarction [facility]) 05/10/2015   Hypokalemia 09/30/2013   Vertigo 02/28/2013   Sinusitis 02/28/2013   Anxiety 02/28/2013   Paroxysmal atrial fibrillation with RVR (HCC) 02/28/2013   PAC (premature atrial contraction) 04/10/2012   Abnormal EKG 01/19/2012   Persistent atrial fibrillation (HCC) 02/15/2010   PCP:  Cleatis Polka., MD Pharmacy:   [facility] [phone] - [place], [place]  - [address] AT [address] OF [facility] [address] [place] [address] Phone: [phone] Fax: [phone]  [facility] [address] [place] - [address] [place] [address] Phone: [phone] Fax: [phone]  [facility] [place], [place] - [address] [place] [address] Phone: [phone] Fax: [phone]     Social Determinants of Health (SDOH) Social History: SDOH Screenings   Food Insecurity: No Food Insecurity (10/22/2022)  Housing: Low Risk  (10/22/2022)  Transportation Needs: No Transportation Needs (10/22/2022)  Utilities: Not At Risk (10/22/2022)  Tobacco Use: Low Risk  (02/15/2023)   SDOH Interventions:     Readmission Risk Interventions    08/29/2022   10:19 AM  Readmission Risk Prevention Plan  Transportation Screening Complete  Medication Review (RN Care Manager) Complete  PCP or Specialist appointment within 3-5 days of discharge Complete  HRI or Home Care Consult Complete  SW Recovery Care/Counseling Consult Complete  Palliative Care Screening Not Applicable  Skilled Nursing Facility Complete

## 2023-02-15 NOTE — ED Notes (Signed)
ED TO INPATIENT HANDOFF REPORT  Name/Age/Gender Tiffany Black 89 y.o. female  Code Status    Code Status Orders  (From admission, onward)           Start     Ordered   02/15/23 0613  Full code  Continuous       Question:  By:  Answer:  Consent: discussion documented in EHR   02/15/23 0615           Code Status History     Date Active Date Inactive Code Status Order ID Comments User Context   10/20/2022 2349 10/24/2022 2216 Full Code [id number]  Angie Fava, DO ED   09/27/2022 2143 10/03/2022 2032 Full Code [id number]  Briscoe Deutscher, MD ED   08/27/2022 0204 08/30/2022 1915 Full Code [id number]  Howerter, Chaney Born, DO ED   08/09/2022 2201 08/16/2022 0217 Full Code [id number]  Angie Fava, DO ED   05/23/2015 0237 05/24/2015 2107 Full Code [id number]  Therisa Doyne, MD ED   05/10/2015 1459 05/15/2015 1550 Full Code [id number]  Zannie Cove, MD Inpatient       Home/SNF/Other Skilled nursing facility  Chief Complaint CAP (community acquired pneumonia) [J18.9]  Level of Care/Admitting Diagnosis ED Disposition     ED Disposition  Admit   Condition  --   Comment  Hospital Area: [facility] [[id number]]  Level of Care: Telemetry [5]  Admit to tele based on following criteria: Other see comments  Comments: ...  May admit patient to [facility] or [facility] if equivalent level of care is available:: No  Covid Evaluation: Asymptomatic - no recent exposure (last 10 days) testing not required  Diagnosis: CAP (community acquired pneumonia) [536644]  Admitting Physician: Lurline Del [[id number]]  Attending Physician: Lurline Del [[id number]]  Certification:: I certify this patient will need inpatient services for at least 2 midnights  Estimated Length of Stay: 3          Medical History Past Medical History:  Diagnosis Date   Anxiety    Arthritis    BPPV (benign paroxysmal positional vertigo)    Breast lesion    left    Bulging of cervical intervertebral disc    Cardiomegaly    Cataracts, bilateral    Chronic neck pain    CVA (cerebral vascular accident) (HCC)    x2 2013 and 2015; right temporal lobe and left frontal lobe infarct-> dzziness which resolved; occluded right PCA, moderate posterior left cerebral stenosis, left MCA stenosis on MRA   Dementia (HCC)    Essential hypertension    History of endometrial cancer    History of stroke    2015 and 2016 // carotid US 8/16: Bilateral 1-39 // carotid US 2/16: Bilateral ICA 1-39   HLD (hyperlipidemia)    Hx of echocardiogram    a. Echo (11/15): Mild LVH, EF 55-60%, normal wall motion, grade 2 diastolic dysfunction, mild LAE, normal RV function, PASP 29 mm Hg // echo 8/16:Mild concentric LVH, EF 55-60, normal wall motion, grade 2 diastolic dysfunction, mild LAE, PASP 39   Hypertension    IBS (irritable bowel syndrome)    Insomnia    Lung nodule    OA (osteoarthritis)    Pectus excavatum    Persistent atrial fibrillation (HCC)    Pleural effusion    right recurrent, 1/24 s/p thoracentesis x 3 (cytology negative)- Dr Francine Graven   Pneumonia due to COVID-19 virus    Radius fracture    left distal  Vertigo    Vitamin D deficiency     Allergies Allergies  Allergen Reactions   Hydrochlorothiazide Hives   Penicillins Hives    Has patient had a PCN reaction causing immediate rash, facial/tongue/throat swelling, SOB or lightheadedness with hypotension: Yes Has patient had a PCN reaction causing severe rash involving mucus membranes or skin necrosis: No Has patient had a PCN reaction that required hospitalization No Has patient had a PCN reaction occurring within the last 10 years: No If all of the above answers are "NO", then may proceed with Cephalosporin use.    Ciprofloxacin Other (See Comments) and Hypertension    Blood pressure issues- patient doesn't recall if it increased OR decreased it     Doxycycline Diarrhea and Nausea And Vomiting    Hydrocodone-Acetaminophen Other (See Comments)    Unknown reaction   Iodinated Contrast Media Diarrhea, Other (See Comments) and Hypertension    Elevated the B/P   Iodine-131 Other (See Comments) and Hypertension    Raised the blood pressure   Lasix [Furosemide] Other (See Comments)    No reaction listed on order summary report from [facility]   Latex Itching   Niaspan [Niacin]     Flu (from [facility] notes)   Statins Other (See Comments)    Muscle weakness   Carbamazepine Other (See Comments) and Rash    Flushed blood pressure medication out of system   Codeine Nausea And Vomiting   Nickel Rash   Sulfa Antibiotics Hives and Rash   Sulfamethoxazole Hives and Rash   Sulfonamide Derivatives Hives and Rash         IV Location/Drains/Wounds Patient Lines/Drains/Airways Status     Active Line/Drains/Airways     Name Placement date Placement time Site Days   Peripheral IV 02/15/23 20 G 1" Right Antecubital 02/15/23  0330  Antecubital  less than 1            Labs/Imaging Results for orders placed or performed during the hospital encounter of 02/15/23 (from the past 48 hour(s))  Resp panel by RT-PCR (RSV, Flu A&B, Covid) Anterior Nasal Swab     Status: None   Collection Time: 02/15/23  3:30 AM   Specimen: Anterior Nasal Swab  Result Value Ref Range   SARS Coronavirus 2 by RT PCR NEGATIVE NEGATIVE    Comment: (NOTE) SARS-CoV-2 target nucleic acids are NOT DETECTED.  The SARS-CoV-2 RNA is generally detectable in upper respiratory specimens during the acute phase of infection. The lowest concentration of SARS-CoV-2 viral copies this assay can detect is 138 copies/mL. A negative result does not preclude SARS-Cov-2 infection and should not be used as the sole basis for treatment or other patient management decisions. A negative result may occur with  improper specimen collection/handling, submission of specimen other than nasopharyngeal swab, presence of viral mutation(s) within  the areas targeted by this assay, and inadequate number of viral copies(<138 copies/mL). A negative result must be combined with clinical observations, patient history, and epidemiological information. The expected result is Negative.  Fact Sheet for Patients:  BloggerCourse.com  Fact Sheet for Healthcare Providers:  SeriousBroker.it  This test is no t yet approved or cleared by the [place] FDA and  has been authorized for detection and/or diagnosis of SARS-CoV-2 by FDA under an Emergency Use Authorization (EUA). This EUA will remain  in effect (meaning this test can be used) for the duration of the COVID-19 declaration under Section 564(b)(1) of the Act, 21 U.S.C.section 360bbb-3(b)(1), unless the authorization is terminated  or revoked sooner.       Influenza A by PCR NEGATIVE NEGATIVE   Influenza B by PCR NEGATIVE NEGATIVE    Comment: (NOTE) The Xpert Xpress SARS-CoV-2/FLU/RSV plus assay is intended as an aid in the diagnosis of influenza from Nasopharyngeal swab specimens and should not be used as a sole basis for treatment. Nasal washings and aspirates are unacceptable for Xpert Xpress SARS-CoV-2/FLU/RSV testing.  Fact Sheet for Patients: BloggerCourse.com  Fact Sheet for Healthcare Providers: SeriousBroker.it  This test is not yet approved or cleared by the [place] FDA and has been authorized for detection and/or diagnosis of SARS-CoV-2 by FDA under an Emergency Use Authorization (EUA). This EUA will remain in effect (meaning this test can be used) for the duration of the COVID-19 declaration under Section 564(b)(1) of the Act, 21 U.S.C. section 360bbb-3(b)(1), unless the authorization is terminated or revoked.     Resp Syncytial Virus by PCR NEGATIVE NEGATIVE    Comment: (NOTE) Fact Sheet for Patients: BloggerCourse.com  Fact  Sheet for Healthcare Providers: SeriousBroker.it  This test is not yet approved or cleared by the [place] FDA and has been authorized for detection and/or diagnosis of SARS-CoV-2 by FDA under an Emergency Use Authorization (EUA). This EUA will remain in effect (meaning this test can be used) for the duration of the COVID-19 declaration under Section 564(b)(1) of the Act, 21 U.S.C. section 360bbb-3(b)(1), unless the authorization is terminated or revoked.  Performed at [facility], [address] [place], [place] [address]   CBC with Differential     Status: Abnormal   Collection Time: 02/15/23  3:39 AM  Result Value Ref Range   WBC 6.0 4.0 - 10.5 K/uL   RBC 3.78 (L) 3.87 - 5.11 MIL/uL   Hemoglobin 11.1 (L) 12.0 - 15.0 g/dL   HCT 60.4 54.0 - 98.1 %   MCV 95.2 80.0 - 100.0 fL   MCH 29.4 26.0 - 34.0 pg   MCHC 30.8 30.0 - 36.0 g/dL   RDW 19.1 47.8 - 29.5 %   Platelets 207 150 - 400 K/uL   nRBC 0.0 0.0 - 0.2 %   Neutrophils Relative % 66 %   Neutro Abs 4.0 1.7 - 7.7 K/uL   Lymphocytes Relative 22 %   Lymphs Abs 1.3 0.7 - 4.0 K/uL   Monocytes Relative 8 %   Monocytes Absolute 0.5 0.1 - 1.0 K/uL   Eosinophils Relative 3 %   Eosinophils Absolute 0.2 0.0 - 0.5 K/uL   Basophils Relative 1 %   Basophils Absolute 0.0 0.0 - 0.1 K/uL   Immature Granulocytes 0 %   Abs Immature Granulocytes 0.01 0.00 - 0.07 K/uL    Comment: Performed at [facility], [address] [place], [place] [address]  Basic metabolic panel     Status: Abnormal   Collection Time: 02/15/23  3:39 AM  Result Value Ref Range   Sodium 136 135 - 145 mmol/L   Potassium 4.0 3.5 - 5.1 mmol/L   Chloride 105 98 - 111 mmol/L   CO2 21 (L) 22 - 32 mmol/L   Glucose, Bld 115 (H) 70 - 99 mg/dL    Comment: Glucose reference range applies only to samples taken after fasting for at least 8 hours.   BUN 19 8 - 23 mg/dL   Creatinine, Ser 8.65 (H) 0.44 - 1.00  mg/dL   Calcium 9.1 8.9 - 78.4 mg/dL   GFR, Estimated 43 (L) >60 mL/min  Comment: (NOTE) Calculated using the CKD-EPI Creatinine Equation (2021)    Anion gap 10 5 - 15    Comment: Performed at [facility], [address] [place], [place] [address]  Troponin I (High Sensitivity)     Status: None   Collection Time: 02/15/23  3:39 AM  Result Value Ref Range   Troponin I (High Sensitivity) 13 <18 ng/L    Comment: (NOTE) Elevated high sensitivity troponin I (hsTnI) values and significant  changes across serial measurements may suggest ACS but many other  chronic and acute conditions are known to elevate hsTnI results.  Refer to the "Links" section for chest pain algorithms and additional  guidance. Performed at [facility], [address], [place], [place] [address]   Brain natriuretic peptide     Status: Abnormal   Collection Time: 02/15/23  3:40 AM  Result Value Ref Range   B Natriuretic Peptide 476.8 (H) 0.0 - 100.0 pg/mL    Comment: Performed at [facility], [address], [place], [place] [address]  Urinalysis, Routine w reflex microscopic -Urine, Clean Catch     Status: Abnormal   Collection Time: 02/15/23  5:15 AM  Result Value Ref Range   Color, Urine YELLOW YELLOW   APPearance HAZY (A) CLEAR   Specific Gravity, Urine 1.011 1.005 - 1.030   pH 5.0 5.0 - 8.0   Glucose, UA NEGATIVE NEGATIVE mg/dL   Hgb urine dipstick NEGATIVE NEGATIVE   Bilirubin Urine NEGATIVE NEGATIVE   Ketones, ur NEGATIVE NEGATIVE mg/dL   Protein, ur 30 (A) NEGATIVE mg/dL   Nitrite NEGATIVE NEGATIVE   Leukocytes,Ua SMALL (A) NEGATIVE   RBC / HPF 0-5 0 - 5 RBC/hpf   WBC, UA 11-20 0 - 5 WBC/hpf   Bacteria, UA RARE (A) NONE SEEN   Squamous Epithelial / HPF 6-10 0 - 5 /HPF   Mucus PRESENT    Budding Yeast PRESENT    Hyaline Casts, UA PRESENT     Comment: Performed at [facility], [address], [place], [place]  [address]  Troponin I (High Sensitivity)     Status: None   Collection Time: 02/15/23  5:29 AM  Result Value Ref Range   Troponin I (High Sensitivity) 17 <18 ng/L    Comment: (NOTE) Elevated high sensitivity troponin I (hsTnI) values and significant  changes across serial measurements may suggest ACS but many other  chronic and acute conditions are known to elevate hsTnI results.  Refer to the "Links" section for chest pain algorithms and additional  guidance. Performed at [facility], [address] [place], [place] [address]    DG Chest Portable 1 View  Result Date: 02/15/2023 CLINICAL DATA:  Shortness of breath EXAM: PORTABLE CHEST 1 VIEW COMPARISON:  01/08/2023 FINDINGS: Cardiac shadow is enlarged but stable. Aortic calcifications are again seen. Right-sided pleural effusion is increased in the interval from the prior exam. Underlying consolidation is likely present as well. Left lung is clear. IMPRESSION: Increasing right-sided effusion with underlying consolidation. Electronically Signed   By: Alcide Clever M.D.   On: 02/15/2023 03:53    Pending Labs Unresulted Labs (From admission, onward)     Start     Ordered   02/16/23 0500  Comprehensive metabolic panel  Tomorrow morning,   R        02/15/23 0615   02/16/23 0500  CBC  Tomorrow morning,   R        02/15/23  0615   02/15/23 0614  TSH  Once,   R        02/15/23 0615   02/15/23 0614  Expectorated Sputum Assessment w Gram Stain, Rflx to Resp Cult  Once,   R        02/15/23 0615   02/15/23 0612  Legionella Pneumophila Serogp 1 Ur Ag  (COPD / Pneumonia / Cellulitis / Lower Extremity Wound)  Once,   R        02/15/23 0615   02/15/23 0612  Strep pneumoniae urinary antigen  (COPD / Pneumonia / Cellulitis / Lower Extremity Wound)  Once,   R        02/15/23 0615   02/15/23 0612  Expectorated Sputum Assessment w Gram Stain, Rflx to Resp Cult  (COPD / Pneumonia / Cellulitis / Lower Extremity Wound)  Once,   R         02/15/23 0615   02/15/23 0424  Blood culture (routine x 2)  BLOOD CULTURE X 2,   R     Question Answer Comment  Patient immune status Normal   Release to patient Immediate      02/15/23 0423            Vitals/Pain Today's Vitals   02/15/23 0612 02/15/23 0615 02/15/23 0630 02/15/23 0642  BP:  (!) 177/103 (!) 168/114   Pulse:  62 81 83  Resp:   20 (!) 22  Temp:      TempSrc:      SpO2:  97% 98% 97%  Weight:      Height:      PainSc: Asleep       Isolation Precautions No active isolations  Medications Medications  amLODipine (NORVASC) tablet 10 mg (has no administration in time range)  metoprolol tartrate (LOPRESSOR) tablet 50 mg (has no administration in time range)  irbesartan (AVAPRO) tablet 150 mg (has no administration in time range)  FLUoxetine (PROZAC) capsule 10 mg (has no administration in time range)  QUEtiapine (SEROQUEL) tablet 50 mg (has no administration in time range)  cefTRIAXone (ROCEPHIN) 2 g in sodium chloride 0.9 % 100 mL IVPB (has no administration in time range)  azithromycin (ZITHROMAX) 500 mg in sodium chloride 0.9 % 250 mL IVPB (has no administration in time range)  acetaminophen (TYLENOL) tablet 650 mg (has no administration in time range)    Or  acetaminophen (TYLENOL) suppository 650 mg (has no administration in time range)  ondansetron (ZOFRAN) tablet 4 mg (has no administration in time range)    Or  ondansetron (ZOFRAN) injection 4 mg (has no administration in time range)  bumetanide (BUMEX) injection 0.5 mg (has no administration in time range)  hydrALAZINE (APRESOLINE) injection 10 mg (has no administration in time range)  cefTRIAXone (ROCEPHIN) 1 g in sodium chloride 0.9 % 100 mL IVPB (0 g Intravenous Stopped 02/15/23 0608)  azithromycin (ZITHROMAX) 500 mg in sodium chloride 0.9 % 250 mL IVPB (500 mg Intravenous New Bag/Given 02/15/23 0608)  amLODipine (NORVASC) tablet 5 mg (5 mg Oral Given 02/15/23 0558)    Mobility walks with device

## 2023-02-15 NOTE — H&P (Addendum)
History and Physical    Tiffany Black ZOX:[id number] DOB: 12-31-1933 DOA: 02/15/2023  PCP: Cleatis Polka., MD  Patient coming from: NH   I have personally briefly reviewed patient's old medical records in [facility]  Chief Complaint: sob Tiffany Black  HPI: Tiffany Black is a 89 y.o. female with medical history significant of  vascular dementia, anxiety, CVA, HTN , history of endometrial cancer, HLD, HTN, IBS Lung nodule ,persistent atrial fibrillation,pleural effusion recurrent,CHFpef (grade II diastolic dysfunction), patient presented to ED BIB EMS from NH with sob , per noted in the field patient sat was 92-93%. Of note patient is not able to give history due to history of dementia.  She denies any pain ,sob/ cough/ fever/chills/ n/v/d/.   ED Course:  Afeb, bp 183/101,  hr 73 rr 20 sat 96% on 3L EKG: atrial fib with LAFB, nonspecific t wave changes CBC Wbc: 6, hgb 11.1, plt 207  Na 136, K 4, CL 105, bicarb 21, cr 1.22 CE13  UA - wbc 11-20 BNP 476 ( 208) Cxr IMPRESSION: Increasing right-sided effusion with underlying consolidation.  Tx ctz /azithromycin   Review of Systems: As per HPI otherwise 10 point review of systems negative.   Past Medical History:  Diagnosis Date   Anxiety    Arthritis    BPPV (benign paroxysmal positional vertigo)    Breast lesion    left   Bulging of cervical intervertebral disc    Cardiomegaly    Cataracts, bilateral    Chronic neck pain    CVA (cerebral vascular accident) (HCC)    x2 2013 and 2015; right temporal lobe and left frontal lobe infarct-> dzziness which resolved; occluded right PCA, moderate posterior left cerebral stenosis, left MCA stenosis on MRA   Dementia [facility])    Essential hypertension    History of endometrial cancer    History of stroke    2015 and 2016 // carotid US 8/16: Bilateral 1-39 // carotid US 2/16: Bilateral ICA 1-39   HLD (hyperlipidemia)    Hx of echocardiogram    a. Echo (11/15): Mild LVH, EF  55-60%, normal wall motion, grade 2 diastolic dysfunction, mild LAE, normal RV function, PASP 29 mm Hg // echo 8/16:Mild concentric LVH, EF 55-60, normal wall motion, grade 2 diastolic dysfunction, mild LAE, PASP 39   Hypertension    IBS (irritable bowel syndrome)    Insomnia    Lung nodule    OA (osteoarthritis)    Pectus excavatum    Persistent atrial fibrillation (HCC)    Pleural effusion    right recurrent, 1/24 s/p thoracentesis x 3 (cytology negative)- Dr Francine Graven   Pneumonia due to COVID-19 virus    Radius fracture    left distal   Vertigo    Vitamin D deficiency     Past Surgical History:  Procedure Laterality Date   ABDOMINAL HYSTERECTOMY  2011   CATARACT EXTRACTION, BILATERAL Bilateral 2009   CESAREAN SECTION  1975   IR THORACENTESIS ASP PLEURAL SPACE W/IMG GUIDE  09/28/2022   IR THORACENTESIS ASP PLEURAL SPACE W/IMG GUIDE  10/23/2022   LAPAROSCOPIC HYSTERECTOMY  2011   THORACENTESIS Right 11/03/2022   Procedure: THORACENTESIS;  Surgeon: Omar Person, MD;  Location: [facility] ENDOSCOPY;  Service: Pulmonary;  Laterality: Right;     reports that she has never smoked. She has never used smokeless tobacco. She reports that she does not drink alcohol and does not use drugs.  Allergies  Allergen Reactions   Hydrochlorothiazide Hives  Penicillins Hives    Has patient had a PCN reaction causing immediate rash, facial/tongue/throat swelling, SOB or lightheadedness with hypotension: Yes Has patient had a PCN reaction causing severe rash involving mucus membranes or skin necrosis: No Has patient had a PCN reaction that required hospitalization No Has patient had a PCN reaction occurring within the last 10 years: No If all of the above answers are "NO", then may proceed with Cephalosporin use.    Ciprofloxacin Other (See Comments) and Hypertension    Blood pressure issues- patient doesn't recall if it increased OR decreased it     Doxycycline Diarrhea and Nausea And Vomiting    Hydrocodone-Acetaminophen Other (See Comments)    Unknown reaction   Iodinated Contrast Media Diarrhea, Other (See Comments) and Hypertension    Elevated the B/P   Iodine-131 Other (See Comments) and Hypertension    Raised the blood pressure   Lasix [Furosemide] Other (See Comments)    No reaction listed on order summary report from [facility]   Latex Itching   Niaspan [Niacin]     Flu (from [facility] notes)   Statins Other (See Comments)    Muscle weakness   Carbamazepine Other (See Comments) and Rash    Flushed blood pressure medication out of system   Codeine Nausea And Vomiting   Nickel Rash   Sulfa Antibiotics Hives and Rash   Sulfamethoxazole Hives and Rash   Sulfonamide Derivatives Hives and Rash         Family History  Problem Relation Age of Onset   Stroke Mother    Heart attack Father    Heart disease Father    Dementia Sister    CAD Brother    Stroke Maternal Uncle    Hypertension Neg Hx    Prior to Admission medications   Medication Sig Start Date End Date Taking? Authorizing Provider  acetaminophen (TYLENOL) 325 MG tablet Take 650 mg by mouth every 6 (six) hours as needed for moderate pain.   Yes [provider]  felodipine (PLENDIL) 5 MG 24 hr tablet Take 5 mg by mouth daily. 12/26/22  Yes [provider]  FLUoxetine (PROZAC) 10 MG capsule Take 10 mg by mouth daily.   Yes [provider]  metoprolol tartrate (LOPRESSOR) 50 MG tablet Take 50 mg by mouth 2 (two) times daily. 01/15/23  Yes [provider]  nystatin (MYCOSTATIN/NYSTOP) powder Apply 1 Application topically 2 (two) times daily. 01/11/23  Yes [provider]  potassium chloride SA (KLOR-CON M) 20 MEQ tablet One po bid x 3 days, then take one po once a day Patient taking differently: Take 10 mEq by mouth daily. 12/11/22  Yes Cathren Laine, MD  QUEtiapine (SEROQUEL) 50 MG tablet Take 1 tablet (50 mg total) by mouth at bedtime. 08/15/22  Yes Elgergawy, Leana Roe, MD  valsartan  (DIOVAN) 160 MG tablet Take 80 mg by mouth 2 (two) times daily.   Yes [provider]  cephALEXin (KEFLEX) 500 MG capsule Take 1 capsule (500 mg total) by mouth 4 (four) times daily. Patient not taking: Reported on 02/15/2023 12/08/22   Mesner, Barbara Cower, MD  diclofenac Sodium (VOLTAREN) 1 % GEL Apply 2 g topically 4 (four) times daily. To right wrist Patient not taking: Reported on 12/18/2022 10/02/22   Almon Hercules, MD  lidocaine (LIDODERM) 5 % Place 1 patch onto the skin daily. Remove & Discard patch within 12 hours or as directed by MD Patient not taking: Reported on 02/15/2023 01/08/23   Lynelle Doctor,  Cletis Athens, MD  metoprolol tartrate (LOPRESSOR) 100 MG tablet Take 1 tablet (100 mg total) by mouth 2 (two) times daily. Patient not taking: Reported on 02/15/2023 11/22/22   Sheilah Pigeon, PA-C  Rivaroxaban (XARELTO) 15 MG TABS tablet Take 1 tablet (15 mg total) by mouth daily with supper. Patient not taking: Reported on 12/18/2022 03/06/22   Duke Salvia, MD  senna-docusate (SENOKOT-S) 8.6-50 MG tablet Take 1 tablet by mouth at bedtime as needed for mild constipation. Patient not taking: Reported on 02/15/2023 10/02/22   Almon Hercules, MD    Physical Exam: Vitals:   02/15/23 0345 02/15/23 0415 02/15/23 0445 02/15/23 0500  BP: (!) 176/94 (!) 184/107 (!) 200/124 (!) 205/116  Pulse: 61 77 85 79  Resp: 19 16 18  (!) 26  Temp:      TempSrc:      SpO2: 94% 92% 97% 95%  Weight:      Height:        Constitutional: NAD, calm, comfortable Vitals:   02/15/23 0345 02/15/23 0415 02/15/23 0445 02/15/23 0500  BP: (!) 176/94 (!) 184/107 (!) 200/124 (!) 205/116  Pulse: 61 77 85 79  Resp: 19 16 18  (!) 26  Temp:      TempSrc:      SpO2: 94% 92% 97% 95%  Weight:      Height:       Eyes: PERRL, + drainage with crusting  ENMT: Mucous membranes are moist. Posterior pharynx clear of any exudate or lesions. Neck: normal, supple, no masses, no thyromegaly Respiratory: anterior clear  no wheezing, no crackles.  Normal respiratory effort. No accessory muscle use.  Cardiovascular: Regular rate and rhythm, no murmurs / rubs / gallops. +extremity edema. 2+ pedal pulses.   Abdomen: no tenderness, no masses palpated. No hepatosplenomegaly. Bowel sounds positive.  Musculoskeletal: no clubbing / cyanosis. No joint deformity upper and lower extremities. Good ROM, no contractures. Normal muscle tone.  Skin: no rashes, lesions, ulcers. No induration Neurologic: CN 2-12 grossly intact. Sensation intact, DTR normal. Strength 5/5 in all 4.  Psychiatric: Normal judgment and insight. Alert and oriented x 3. Normal mood.    Labs on Admission: I have personally reviewed following labs and imaging studies  CBC: Recent Labs  Lab 02/15/23 0339  WBC 6.0  NEUTROABS 4.0  HGB 11.1*  HCT 36.0  MCV 95.2  PLT 207   Basic Metabolic Panel: Recent Labs  Lab 02/15/23 0339  NA 136  K 4.0  CL 105  CO2 21*  GLUCOSE 115*  BUN 19  CREATININE 1.22*  CALCIUM 9.1   GFR: Estimated Creatinine Clearance: 29.2 mL/min (A) (by C-G formula based on SCr of 1.22 mg/dL (H)). Liver Function Tests: No results for input(s): "AST", "ALT", "ALKPHOS", "BILITOT", "PROT", "ALBUMIN" in the last 168 hours. No results for input(s): "LIPASE", "AMYLASE" in the last 168 hours. No results for input(s): "AMMONIA" in the last 168 hours. Coagulation Profile: No results for input(s): "INR", "PROTIME" in the last 168 hours. Cardiac Enzymes: No results for input(s): "CKTOTAL", "CKMB", "CKMBINDEX", "TROPONINI" in the last 168 hours. BNP (last 3 results) No results for input(s): "PROBNP" in the last 8760 hours. HbA1C: No results for input(s): "HGBA1C" in the last 72 hours. CBG: No results for input(s): "GLUCAP" in the last 168 hours. Lipid Profile: No results for input(s): "CHOL", "HDL", "LDLCALC", "TRIG", "CHOLHDL", "LDLDIRECT" in the last 72 hours. Thyroid Function Tests: No results for input(s): "TSH", "T4TOTAL", "FREET4", "T3FREE",  "THYROIDAB" in the last 72 hours. Anemia  Panel: No results for input(s): "VITAMINB12", "FOLATE", "FERRITIN", "TIBC", "IRON", "RETICCTPCT" in the last 72 hours. Urine analysis:    Component Value Date/Time   COLORURINE YELLOW 02/15/2023 0515   APPEARANCEUR HAZY (A) 02/15/2023 0515   LABSPEC 1.011 02/15/2023 0515   PHURINE 5.0 02/15/2023 0515   GLUCOSEU NEGATIVE 02/15/2023 0515   HGBUR NEGATIVE 02/15/2023 0515   BILIRUBINUR NEGATIVE 02/15/2023 0515   KETONESUR NEGATIVE 02/15/2023 0515   PROTEINUR 30 (A) 02/15/2023 0515   UROBILINOGEN 0.2 05/22/2015 1950   NITRITE NEGATIVE 02/15/2023 0515   LEUKOCYTESUR SMALL (A) 02/15/2023 0515    Radiological Exams on Admission: DG Chest Portable 1 View  Result Date: 02/15/2023 CLINICAL DATA:  Shortness of breath EXAM: PORTABLE CHEST 1 VIEW COMPARISON:  01/08/2023 FINDINGS: Cardiac shadow is enlarged but stable. Aortic calcifications are again seen. Right-sided pleural effusion is increased in the interval from the prior exam. Underlying consolidation is likely present as well. Left lung is clear. IMPRESSION: Increasing right-sided effusion with underlying consolidation. Electronically Signed   By: Alcide Clever M.D.   On: 02/15/2023 03:53    EKG: Independently reviewed. See above   Assessment/Plan   CAP with acute hypoxic respiratory failure  - admit to med tele  -ctx/ azithromycin, de-escalate as able  -pulmonary toilet  -urine ag, sputum, f/u on culture data   -wean O2 as able   Recurrent Pleural effusion  -last tap 1/24 , evaluated  note to be benign  - trial of bumex -consider re tap , or pleurx due to recurrence -pulmonary consult   CHFpef -grade II diastolic  -possible mild CHF -lasix x 1 now  -re-dose based on clinical response /need   HTN , uncontrolled  -will start with giving oral medications  -will add prn anti-htn as well   CVA Resume secondary pp x  Anxiety /Dementia -resume home regimen   Atrial Fib with cvr   -resume metoprolol -on xarleto     DVT prophylaxis: on full dose OAC Code Status: full/ as discussed per patient wishes in event of cardiac arrest  Family Communication:  none at bedside Disposition Plan: patient  expected to be admitted greater than 2 midnights  Consults called:  DR Mier/Pulmonary  Admission status: med tele   Lurline Del MD [facility]  If 7PM-7AM, please contact night-coverage [email or web address] Password [facility]  02/15/2023, 5:45 AM

## 2023-02-15 NOTE — Progress Notes (Signed)
02/15/2023 APP Joneen Roach and I saw and evaluated the patient. Discussed with them and agree with their findings and plan as documented in the their note.  S:  89yF with chronic diastolic heart failure, pAF, HTN, advanced dementia here with recurrent R pleural effusion sp 1.1L R thoracentesis.   She is resting in bed after her thora at time of our visit.  O: Blood pressure 128/64, pulse 71, temperature 97.6 F (36.4 C), temperature source Oral, resp. rate 19, height 5\' 5"  (1.651 m), weight 59.9 kg, SpO2 99 %.   Exam: Gen:       No acute distress HEENT:  tracking, sclera anicteric Neck:      No masses, JVD Lungs:    Clear to auscultation bilaterally; normal respiratory effort CV:         IRIR; no murmurs Abd:      + bowel sounds; soft, non-tender; no palpable masses, no distension Ext:    1+ dependent edema; adequate peripheral perfusion Skin:       Warm and dry; no rash Neuro:    grossly nonfocal  Labs/imaging reviewed  A:  # Recurrent transudative, lymphocytic pleural effusion # Recurrent admissions, advanced dementia   She doesn't recall her thoracentesis today or how it affected her breathing. From discussion with her son they do improve her work of breathing however her last thoracentesis was in February. I also think she does look at least extravascularly volume overloaded still.   P:  - f/u pleural fluid studies - if recurs during this hospitalization along with increased WOB or if needs thoracentesis more than every couple of weeks despite best effort at diuretic then could entertain PleurX but this may complicate her placement. We would have to check with TOC if this is the case. Alternatively could place pigtail and do chest tube pleurodesis however I worry that in setting of her CHF it has higher likelihood of failure than average. It also seems like aggressive/painful procedure to pursue for a patient who has had this degree of functional decline recently.  - agree with  consideration of involving palliative care if family agreeable  Tentatively we will arrange follow up with Dr. Francine Graven in 2-4 weeks. We will sign off but we are glad to be reinvolved as condition changes   Laroy Apple Pulmonary/Critical Care

## 2023-02-15 NOTE — Telephone Encounter (Signed)
Can we set up clinic appointment in 2-4 weeks with Dr. Francine Graven? Needs CXR on day of visit  Thanks!

## 2023-02-16 ENCOUNTER — Inpatient Hospital Stay (HOSPITAL_COMMUNITY): Payer: Medicare Other

## 2023-02-16 DIAGNOSIS — F03918 Unspecified dementia, unspecified severity, with other behavioral disturbance: Secondary | ICD-10-CM

## 2023-02-16 DIAGNOSIS — R4589 Other symptoms and signs involving emotional state: Secondary | ICD-10-CM

## 2023-02-16 DIAGNOSIS — Z7189 Other specified counseling: Secondary | ICD-10-CM

## 2023-02-16 DIAGNOSIS — J189 Pneumonia, unspecified organism: Secondary | ICD-10-CM | POA: Diagnosis not present

## 2023-02-16 DIAGNOSIS — I5033 Acute on chronic diastolic (congestive) heart failure: Secondary | ICD-10-CM

## 2023-02-16 DIAGNOSIS — I5031 Acute diastolic (congestive) heart failure: Secondary | ICD-10-CM

## 2023-02-16 DIAGNOSIS — J9601 Acute respiratory failure with hypoxia: Secondary | ICD-10-CM | POA: Diagnosis not present

## 2023-02-16 DIAGNOSIS — Z515 Encounter for palliative care: Secondary | ICD-10-CM

## 2023-02-16 DIAGNOSIS — J90 Pleural effusion, not elsewhere classified: Secondary | ICD-10-CM | POA: Diagnosis not present

## 2023-02-16 LAB — CBC
HCT: 32.4 % — ABNORMAL LOW (ref 36.0–46.0)
Hemoglobin: 10.4 g/dL — ABNORMAL LOW (ref 12.0–15.0)
MCH: 30.1 pg (ref 26.0–34.0)
MCHC: 32.1 g/dL (ref 30.0–36.0)
MCV: 93.6 fL (ref 80.0–100.0)
Platelets: 181 10*3/uL (ref 150–400)
RBC: 3.46 MIL/uL — ABNORMAL LOW (ref 3.87–5.11)
RDW: 14.8 % (ref 11.5–15.5)
WBC: 6.8 10*3/uL (ref 4.0–10.5)
nRBC: 0 % (ref 0.0–0.2)

## 2023-02-16 LAB — COMPREHENSIVE METABOLIC PANEL
ALT: 47 U/L — ABNORMAL HIGH (ref 0–44)
AST: 37 U/L (ref 15–41)
Albumin: 3.1 g/dL — ABNORMAL LOW (ref 3.5–5.0)
Alkaline Phosphatase: 68 U/L (ref 38–126)
Anion gap: 10 (ref 5–15)
BUN: 18 mg/dL (ref 8–23)
CO2: 25 mmol/L (ref 22–32)
Calcium: 8.8 mg/dL — ABNORMAL LOW (ref 8.9–10.3)
Chloride: 102 mmol/L (ref 98–111)
Creatinine, Ser: 1.05 mg/dL — ABNORMAL HIGH (ref 0.44–1.00)
GFR, Estimated: 51 mL/min — ABNORMAL LOW (ref >60)
Glucose, Bld: 92 mg/dL (ref 70–99)
Potassium: 3.3 mmol/L — ABNORMAL LOW (ref 3.5–5.1)
Sodium: 137 mmol/L (ref 135–145)
Total Bilirubin: 1.3 mg/dL — ABNORMAL HIGH (ref 0.3–1.2)
Total Protein: 5.6 g/dL — ABNORMAL LOW (ref 6.5–8.1)

## 2023-02-16 LAB — GLUCOSE, CAPILLARY: Glucose-Capillary: 88 mg/dL (ref 70–99)

## 2023-02-16 LAB — ECHOCARDIOGRAM COMPLETE
Area-P 1/2: 4.12 cm2
Calc EF: 61.6 %
Height: 65 in
MV M vel: 2.08 m/s
MV Peak grad: 17.3 mmHg
S' Lateral: 2.9 cm
Single Plane A2C EF: 63.6 %
Single Plane A4C EF: 65.2 %
Weight: 2112 oz

## 2023-02-16 LAB — PATHOLOGIST SMEAR REVIEW

## 2023-02-16 LAB — CULTURE, BODY FLUID W GRAM STAIN -BOTTLE: Culture: NO GROWTH

## 2023-02-16 LAB — CULTURE, BLOOD (ROUTINE X 2)
Special Requests: ADEQUATE
Special Requests: ADEQUATE

## 2023-02-16 MED ORDER — QUETIAPINE FUMARATE 25 MG PO TABS
25.0000 mg | ORAL_TABLET | Freq: Every day | ORAL | Status: DC
  Administered 2023-02-17: 25 mg via ORAL
  Filled 2023-02-16: qty 1

## 2023-02-16 MED ORDER — POTASSIUM CHLORIDE CRYS ER 20 MEQ PO TBCR
40.0000 meq | EXTENDED_RELEASE_TABLET | Freq: Once | ORAL | Status: AC
  Administered 2023-02-16: 40 meq via ORAL
  Filled 2023-02-16: qty 2

## 2023-02-16 MED ORDER — BUMETANIDE 0.25 MG/ML IJ SOLN
1.0000 mg | Freq: Two times a day (BID) | INTRAMUSCULAR | Status: DC
  Administered 2023-02-16: 1 mg via INTRAVENOUS
  Filled 2023-02-16: qty 10

## 2023-02-16 MED ORDER — LORAZEPAM 1 MG PO TABS
1.0000 mg | ORAL_TABLET | Freq: Three times a day (TID) | ORAL | Status: DC | PRN
  Administered 2023-02-17: 1 mg via ORAL
  Filled 2023-02-16 (×3): qty 1

## 2023-02-16 MED ORDER — ZIPRASIDONE MESYLATE 20 MG IM SOLR
10.0000 mg | Freq: Once | INTRAMUSCULAR | Status: AC
  Administered 2023-02-16: 10 mg via INTRAMUSCULAR
  Filled 2023-02-16: qty 20

## 2023-02-16 MED ORDER — STERILE WATER FOR INJECTION IJ SOLN
INTRAMUSCULAR | Status: AC
  Filled 2023-02-16: qty 10

## 2023-02-16 MED ORDER — SPIRONOLACTONE 25 MG PO TABS
25.0000 mg | ORAL_TABLET | Freq: Every day | ORAL | Status: DC
  Administered 2023-02-16 – 2023-02-17 (×2): 25 mg via ORAL
  Filled 2023-02-16 (×2): qty 1

## 2023-02-16 NOTE — Progress Notes (Signed)
  Echocardiogram 2D Echocardiogram has been performed.  Milda Smart 02/16/2023, 9:58 AM

## 2023-02-16 NOTE — Progress Notes (Signed)
Cardiology Progress Note  Patient ID: Tiffany Black MRN: [id number] DOB: Oct 03, 1933 Date of Encounter: 02/16/2023  Primary Cardiologist: None  Subjective   Chief Complaint: None.   HPI: Status post right thoracentesis.  1.1 L fluid removal.  Bedside echo performed during my interrogation.  Elevated RVSP.  Without complaints.  ROS:  All other ROS reviewed and negative. Pertinent positives noted in the HPI.     Inpatient Medications  Scheduled Meds:  amLODipine  10 mg Oral Daily   bumetanide (BUMEX) IV  1 mg Intravenous BID   FLUoxetine  10 mg Oral Daily   irbesartan  150 mg Oral QHS   metoprolol tartrate  50 mg Oral BID   potassium chloride  40 mEq Oral Once   QUEtiapine  50 mg Oral QHS   spironolactone  25 mg Oral Daily   Continuous Infusions:  azithromycin 500 mg (02/16/23 0519)   cefTRIAXone (ROCEPHIN)  IV 2 g (02/16/23 0640)   PRN Meds: acetaminophen **OR** acetaminophen, haloperidol lactate, hydrALAZINE, ondansetron **OR** ondansetron (ZOFRAN) IV   Vital Signs   Vitals:   02/15/23 1443 02/15/23 1555 02/15/23 2028 02/16/23 0510  BP: 128/64 (!) 148/82 (!) 146/85 (!) 151/84  Pulse:   71 91  Resp:  19 16 16   Temp:  98.1 F (36.7 C) 98.1 F (36.7 C) 98.3 F (36.8 C)  TempSrc:  Oral Oral Oral  SpO2:  98% 94% 97%  Weight:      Height:        Intake/Output Summary (Last 24 hours) at 02/16/2023 0942 Last data filed at 02/16/2023 0900 Gross per 24 hour  Intake 960 ml  Output 550 ml  Net 410 ml      02/15/2023    3:19 AM 01/14/2023    3:11 AM 12/26/2022    8:32 AM  Last 3 Weights  Weight (lbs) 132 lb 132 lb 4.4 oz 132 lb  Weight (kg) 59.875 kg 60 kg 59.875 kg      Telemetry  Overnight telemetry shows A-fib 70s, which I personally reviewed.   Physical Exam   Vitals:   02/15/23 1443 02/15/23 1555 02/15/23 2028 02/16/23 0510  BP: 128/64 (!) 148/82 (!) 146/85 (!) 151/84  Pulse:   71 91  Resp:  19 16 16   Temp:  98.1 F (36.7 C) 98.1 F (36.7 C) 98.3 F (36.8  C)  TempSrc:  Oral Oral Oral  SpO2:  98% 94% 97%  Weight:      Height:        Intake/Output Summary (Last 24 hours) at 02/16/2023 0942 Last data filed at 02/16/2023 0900 Gross per 24 hour  Intake 960 ml  Output 550 ml  Net 410 ml       02/15/2023    3:19 AM 01/14/2023    3:11 AM 12/26/2022    8:32 AM  Last 3 Weights  Weight (lbs) 132 lb 132 lb 4.4 oz 132 lb  Weight (kg) 59.875 kg 60 kg 59.875 kg    Body mass index is 21.97 kg/m.  General: Well nourished, well developed, in no acute distress Head: Atraumatic, normal size  Eyes: PEERLA, EOMI  Neck: Supple, no JVD Endocrine: No thryomegaly Cardiac: Normal S1, S2; irregular rhythm Lungs: Crackles at the lung bases, improved right lung sounds Abd: Soft, nontender, no hepatomegaly  Ext: No edema, pulses 2+ Musculoskeletal: No deformities, BUE and BLE strength normal and equal Skin: Warm and dry, no rashes   Neuro: Alert, awake, oriented to person only  Labs  High Sensitivity Troponin:   Recent Labs  Lab 02/15/23 0339 02/15/23 0529  TROPONINIHS 13 17     Cardiac EnzymesNo results for input(s): "TROPONINI" in the last 168 hours. No results for input(s): "TROPIPOC" in the last 168 hours.  Chemistry Recent Labs  Lab 02/15/23 0339 02/16/23 0552  NA 136 137  K 4.0 3.3*  CL 105 102  CO2 21* 25  GLUCOSE 115* 92  BUN 19 18  CREATININE 1.22* 1.05*  CALCIUM 9.1 8.8*  PROT  --  5.6*  ALBUMIN  --  3.1*  AST  --  37  ALT  --  47*  ALKPHOS  --  68  BILITOT  --  1.3*  GFRNONAA 43* 51*  ANIONGAP 10 10    Hematology Recent Labs  Lab 02/15/23 0339 02/16/23 0552  WBC 6.0 6.8  RBC 3.78* 3.46*  HGB 11.1* 10.4*  HCT 36.0 32.4*  MCV 95.2 93.6  MCH 29.4 30.1  MCHC 30.8 32.1  RDW 15.2 14.8  PLT 207 181   BNP Recent Labs  Lab 02/15/23 0340  BNP 476.8*    DDimer No results for input(s): "DDIMER" in the last 168 hours.   Radiology  DG Chest 1 View  Result Date: 02/15/2023 CLINICAL DATA:  Post right-sided thoracentesis  EXAM: CHEST  1 VIEW COMPARISON:  Earlier same day; 01/02/2023 FINDINGS: Grossly unchanged enlarged cardiac silhouette and mediastinal contours with atherosclerotic plaque within a tortuous and potentially ectatic thoracic aorta. Interval reduction in persistent small potentially partially loculated right-sided pleural effusion post thoracentesis. No pneumothorax. Improved aeration of the right lung base with persistent right basilar heterogeneous/consolidative opacities. Unchanged trace left-sided effusion associated left basilar opacities. No evidence of edema. No acute osseous abnormalities. Degenerative change of the right glenohumeral joint is suspected though incompletely evaluated. Stigmata of dish within the thoracic spine. IMPRESSION: 1. Interval reduction in persistent small potentially partially loculated right-sided pleural effusion post thoracentesis. No pneumothorax. 2. Improved aeration of the right lung base with persistent right basilar heterogeneous/consolidative opacities, atelectasis versus infiltrate. 3. Unchanged trace left-sided effusion and associated left basilar opacities, atelectasis versus infiltrate. Electronically Signed   By: Simonne Come M.D.   On: 02/15/2023 15:04   US THORACENTESIS ASP PLEURAL SPACE W/IMG GUIDE  Result Date: 02/15/2023 INDICATION: Patient with history of vascular dementia, endometrial cancer, atrial fibrillation, congestive heart failure, dyspnea, pneumonia, CVA, recurrent right pleural effusion. Request received for diagnostic and therapeutic right thoracentesis. EXAM: ULTRASOUND GUIDED DIAGNOSTIC AND THERAPEUTIC RIGHT THORACENTESIS MEDICATIONS: 8 ml 1% lidocaine COMPLICATIONS: None immediate. PROCEDURE: An ultrasound guided thoracentesis was thoroughly discussed with the patient's son and questions answered. The benefits, risks, alternatives and complications were also discussed. The patient's son understands and wishes to proceed with the procedure. Written  consent was obtained. Ultrasound was performed to localize and mark an adequate pocket of fluid in the right chest. The area was then prepped and draped in the normal sterile fashion. 1% Lidocaine was used for local anesthesia. Under ultrasound guidance a 6 Fr Safe-T-Centesis catheter was introduced. Thoracentesis was performed. The catheter was removed and a dressing applied. FINDINGS: A total of approximately 1.1 liters of yellow fluid was removed. Samples were sent to the laboratory as requested by the clinical team. Due to persistent patient coughing only the above amount of fluid was removed today. IMPRESSION: Successful ultrasound guided diagnostic and therapeutic right thoracentesis yielding 1.1 liters of pleural fluid. Performed by: Artemio Aly Electronically Signed   By: Simonne Come M.D.   On: 02/15/2023  15:01   DG Chest Portable 1 View  Result Date: 02/15/2023 CLINICAL DATA:  Shortness of breath EXAM: PORTABLE CHEST 1 VIEW COMPARISON:  01/08/2023 FINDINGS: Cardiac shadow is enlarged but stable. Aortic calcifications are again seen. Right-sided pleural effusion is increased in the interval from the prior exam. Underlying consolidation is likely present as well. Left lung is clear. IMPRESSION: Increasing right-sided effusion with underlying consolidation. Electronically Signed   By: Alcide Clever M.D.   On: 02/15/2023 03:53    Cardiac Studies  TTE 08/10/2022  1. Left ventricular ejection fraction, by estimation, is 60 to 65%. The  left ventricle has normal function. The left ventricle has no regional  wall motion abnormalities. There is mild left ventricular hypertrophy.  Indeterminate diastolic filling due to  E-A fusion.   2. Right ventricular systolic function is normal. The right ventricular  size is normal. There is normal pulmonary artery systolic pressure. The  estimated right ventricular systolic pressure is 35.3 mmHg.   3. No evidence of mitral valve regurgitation.   4. Aortic  valve regurgitation is not visualized. Aortic valve  sclerosis/calcification is present, without any evidence of aortic  stenosis.   5. The inferior vena cava is normal in size with greater than 50%  respiratory variability, suggesting right atrial pressure of 3 mmHg.   Patient Profile  Tiffany Black is a 89 y.o. female with permanent atrial fibrillation (off anticoagulation), CVA, hypertension, CKD stage III, dementia admitted on 02/15/2023 for acute hypoxic respiratory failure secondary to recurrent right-sided pleural effusion and diastolic heart failure.  Assessment & Plan   # Acute hypoxic respiratory failure # Recurrent right pleural effusion # Acute diastolic heart failure -Status post thoracentesis yesterday with 1.1 L removed from the right lung.  She has had recurrent right thoracenteses recently.  No indications for Pleurx catheter per pulmonary.  Appears to be transudative. -Bedside echo being performed.  Normal LV and RV function.  Formal read is pending.  TR jet 3.2 m/s and IVC 2.1 that is not collapsing.  This is consistent with moderately elevated pulmonary pressures with an RVSP of 55 mmHg.  We will transition to Bumex 1 mg IV twice daily.  She has furosemide listed as an allergy but tolerated oral Bumex yesterday.  Suspect she may just need one davy of IV diuresis.   -I have added Aldactone to help with potassium supplement.  This will also help with fluid.  Given advanced age would not recommend SGLT2 inhibitor.  # Permanent A-fib -Rate controlled on metoprolol.  Not an issue. -She was on Xarelto but due to recurrent thoracenteses it has been held.  It appears she is not a candidate for Pleurx or pleurodesis.  Would recommend to restart home Xarelto if deemed appropriate by pulmonary.  They do not appear to have any planned procedures so I suspect this will not be an issue.  # Hypertension -Continue irbesartan 150 mg daily, metoprolol tartrate 50 mg twice daily, amlodipine 10  mg daily.  Aldactone 25 mg daily was added as above.      For questions or updates, please contact [facility] Please consult [email or web address] for contact info under        Signed, Gerri Spore T. Flora Lipps, MD, [facility] [place]  [facility]  02/16/2023 9:42 AM

## 2023-02-16 NOTE — Progress Notes (Signed)
Speech Language Pathology Treatment: Dysphagia  Patient Details Name: Tiffany Black MRN: [id number] DOB: Jul 25, 1934 Today's Date: 02/16/2023 Time: 4098-1191 SLP Time Calculation (min) (ACUTE ONLY): 11 min  Assessment / Plan / Recommendation Clinical Impression  SLP follow up for dysphagia management - and to determine indication for instrumental evaluation. Today pt alert and calm, no coughing observed at baseline nor with po pt would accept. Per RN, palliative referral is in place. At this time, do not advise pt have MBS unless aligns with goals of care. Pt left sitting upright in bed eating her [place]. Benefit of MBS would be to determine if compensation strategies or exercise regimen may be helpful, no family present to discuss. Recommend to continue diet with general precautions. Pt was calm and cooperative but repeatedly advised she wanted to get OOB - relayed information to the staff. SLP will sign off at this time; if MBS is desired, please order. Thanks.    HPI HPI: Patient is a 89 year old female with vascular dementia, anxiety, CVA, HTN, history of endometrial cancer, HLP, IBS, persistent atrial fibrillation, recurrent pleural effusion, chronic diastolic CHF presented to ED via EMS from SNF with shortness of breath that started earlier in the morning.  Per EMS, O2 sats were 92 to 93% on RA.  Patient denied any pain, shortness of breath, cough, fevers or chills, nausea. 1.1 liters of fluid removed with thoracentesis.  Pt had a fall and hit her head in 09/2022.  Per imaging:  Pt with There is cervical spondylosis, with  focally prominent anterior osteophytes C5-6 and C6-7, trace  posterior endplate spurring and mild disc osteophyte complexes at  both levels.  Per Critical care medicine pt has had 9 ED visits for falls, UTIs.      SLP Plan  Discharge SLP treatment due to (comment)      Recommendations for follow up therapy are one component of a multi-disciplinary discharge planning  process, led by the attending physician.  Recommendations may be updated based on patient status, additional functional criteria and insurance authorization.    Recommendations  Diet recommendations: Regular;Thin liquid Liquids provided via: Cup;Straw Medication Administration: Whole meds with liquid Supervision: Patient able to self feed Compensations: Slow rate;Small sips/bites Postural Changes and/or Swallow Maneuvers: Seated upright 90 degrees;Upright 30-60 min after meal                  Oral care BID   None Dysphagia, unspecified (R13.10)     Discharge SLP treatment due to (comment)    Rolena Infante, MS [facility] SLP Acute Rehab Services Office [phone]  Chales Abrahams  02/16/2023, 8:00 AM

## 2023-02-16 NOTE — Telephone Encounter (Signed)
Scheduled HFU with Katie on 6/25. Figured she should be soon within time frame rather than wait. Reminder mailed. Nothing further needed.

## 2023-02-16 NOTE — Telephone Encounter (Signed)
Could probably wait till 8 weeks but if Dewald not available by 8 weeks then any MD or APP would be ok.   Thanks!

## 2023-02-16 NOTE — Progress Notes (Signed)
[facility]                                                                              Tiffany Black, is a 89 y.o. female, DOB - [birth year], ZOX:[id number] Admit date - 02/15/2023    Outpatient Primary MD for the patient is Cleatis Polka., MD  LOS - 1  days  Chief Complaint  Patient presents with   Shortness of Breath       Brief summary   Patient is a 89 year old female with vascular dementia, anxiety, CVA, HTN, history of endometrial cancer, HLP, IBS, persistent atrial fibrillation, recurrent pleural effusion, chronic diastolic CHF presented to ED via EMS from SNF with shortness of breath that started earlier in the morning.  Per EMS, O2 sats were 92 to 93% on RA.  Patient denied any pain, shortness of breath, cough, fevers or chills, nausea.   Assessment & Plan    Principal Problem:   Acute respiratory failure with hypoxia (HCC) likely due to CAP PNA, right-sided pleural effusion, mild acute on chronic diastolic CHF -BNP 476.8, no leukocytosis or fevers.  Chest x-ray showed increasing right-sided effusion with underlying consolidation -- Status post thoracentesis on 6/6, 1.1 L removed, transudative  Active Problems:   Recurrent right pleural effusion -Chest x-ray showed increasing right-sided effusion. -s/p paracentesis on 6/6, 1.1 L removed, labs transudative -Appreciate pulmonology recommendations, if pleural effusion recurs this admission, then will consider Pleurx, outpatient follow-up with Dr. Francine Graven in 2 to 4 weeks    CAP (community acquired pneumonia) -Possible aspiration issues as well given right-sided consolidation and recurrent right pleural effusion -Continue IV Zithromax, Rocephin -SLP evaluation done, moderate aspiration risk, recommended to regular diet with thin liquids    Acute on chronic diastolic CHF (congestive heart failure) (HCC) -BNP 476, presented with hypoxia and shortness of breath -2D echo today showed EF of 60 to 65%, LVH,  diastolic function could not be evaluated.  Small pericardial effusion, bilateral atrial enlargement.   -Started on IV Bumex 1 mg twice daily, Aldactone.  (Allergic to furosemide) -Continue Lopressor, ARB   Persistent atrial fibrillation (HCC) CHA2DS2Vasc is 7, currently not on [facility], has been hide Code pulmonology commendations due to recurrent pleural effusions. -Currently on Lopressor 50 mg twice daily  Hypertension, uncontrolled -BP stable, continue Avapro, amlodipine, metoprolol     Dementia with behavioral disturbance (HCC) -Continue Seroquel, IV Haldol as needed for agitation - 1:1 sitter at night   History of cerebral infarction [facility]) -Currently not on anticoagulation, if no plans for [facility], will place on ASA 81 mg daily at discharge. -BP control    Anemia of chronic disease -Hemoglobin baseline ~11 -Currently at baseline, no acute issues   Estimated body mass index is 21.97 kg/m as calculated from the following:   Height as of this encounter: 5\' 5"  (1.651 m).   Weight as of this encounter: 59.9 kg.  Code Status: Full code DVT Prophylaxis:  SCDs Start: 02/15/23 0612   Level of Care: Level of care: Med-Surg Family Communication:  Disposition Plan:      Remains inpatient appropriate: Workup in progress   Procedures:  US guided  thoracentesis 6/6  Consultants:   Cardiology Pulmonology Antimicrobials:   Anti-infectives (From admission, onward)    Start     Dose/Rate Route Frequency Ordered Stop   02/16/23 0600  cefTRIAXone (ROCEPHIN) 2 g in sodium chloride 0.9 % 100 mL IVPB        2 g 200 mL/hr over 30 Minutes Intravenous Every 24 hours 02/15/23 0615 02/20/23 0559   02/16/23 0600  azithromycin (ZITHROMAX) 500 mg in sodium chloride 0.9 % 250 mL IVPB        500 mg 250 mL/hr over 60 Minutes Intravenous Every 24 hours 02/15/23 0615 02/20/23 0559   02/15/23 0500  cefTRIAXone (ROCEPHIN) 1 g in sodium chloride 0.9 % 100 mL IVPB        1 g 200 mL/hr over 30 Minutes  Intravenous  Once 02/15/23 0449 02/15/23 0608   02/15/23 0500  azithromycin (ZITHROMAX) 500 mg in sodium chloride 0.9 % 250 mL IVPB        500 mg 250 mL/hr over 60 Minutes Intravenous  Once 02/15/23 0449 02/15/23 0708          Medications  amLODipine  10 mg Oral Daily   bumetanide (BUMEX) IV  1 mg Intravenous BID   FLUoxetine  10 mg Oral Daily   irbesartan  150 mg Oral QHS   metoprolol tartrate  50 mg Oral BID   QUEtiapine  50 mg Oral QHS   spironolactone  25 mg Oral Daily      Subjective:   Tiffany Black was seen and examined today.  Has underlying dementia, pleasantly confused.  At the time of examination, eating lunch, no acute complaints.  Overnight was somewhat agitated and anxious.  No fevers, chills, nausea vomiting or abdominal pain.    Objective:   Vitals:   02/15/23 1443 02/15/23 1555 02/15/23 2028 02/16/23 0510  BP: 128/64 (!) 148/82 (!) 146/85 (!) 151/84  Pulse:   71 91  Resp:  19 16 16   Temp:  98.1 F (36.7 C) 98.1 F (36.7 C) 98.3 F (36.8 C)  TempSrc:  Oral Oral Oral  SpO2:  98% 94% 97%  Weight:      Height:        Intake/Output Summary (Last 24 hours) at 02/16/2023 1251 Last data filed at 02/16/2023 1200 Gross per 24 hour  Intake 1080 ml  Output 550 ml  Net 530 ml     Wt Readings from Last 3 Encounters:  02/15/23 59.9 kg  01/14/23 60 kg  12/26/22 59.9 kg    Physical Exam General: Alert and oriented x self, NAD Cardiovascular: S1 S2 clear, RRR.  Respiratory: Diminished BS at the bases Gastrointestinal: Soft, nontender, nondistended, NBS Ext: no pedal edema bilaterally Neuro: no new deficits Skin: No rashes Psych: dementia    Data Reviewed:  I have personally reviewed following labs    CBC Lab Results  Component Value Date   WBC 6.8 02/16/2023   RBC 3.46 (L) 02/16/2023   HGB 10.4 (L) 02/16/2023   HCT 32.4 (L) 02/16/2023   MCV 93.6 02/16/2023   MCH 30.1 02/16/2023   PLT 181 02/16/2023   MCHC 32.1 02/16/2023   RDW 14.8  02/16/2023   LYMPHSABS 1.3 02/15/2023   MONOABS 0.5 02/15/2023   EOSABS 0.2 02/15/2023   BASOSABS 0.0 02/15/2023     Last metabolic panel Lab Results  Component Value Date   NA 137 02/16/2023   K 3.3 (L) 02/16/2023   CL 102 02/16/2023   CO2 25 02/16/2023  BUN 18 02/16/2023   CREATININE 1.05 (H) 02/16/2023   GLUCOSE 92 02/16/2023   GFRNONAA 51 (L) 02/16/2023   GFRAA 50 (L) 12/26/2019   CALCIUM 8.8 (L) 02/16/2023   PHOS 3.3 09/30/2022   PROT 5.6 (L) 02/16/2023   ALBUMIN 3.1 (L) 02/16/2023   BILITOT 1.3 (H) 02/16/2023   ALKPHOS 68 02/16/2023   AST 37 02/16/2023   ALT 47 (H) 02/16/2023   ANIONGAP 10 02/16/2023    CBG (last 3)  Recent Labs    02/15/23 0814 02/16/23 0821  GLUCAP 95 88      Coagulation Profile: No results for input(s): "INR", "PROTIME" in the last 168 hours.   Radiology Studies: I have personally reviewed the imaging studies  ECHOCARDIOGRAM COMPLETE  Result Date: 02/16/2023    ECHOCARDIOGRAM REPORT   Patient Name:   Tiffany Black Date of Exam: 02/16/2023 Medical Rec #:  [id number]     Height:       65.0 in Accession #:    [id number]    Weight:       132.0 lb Date of Birth:  1934/01/22     BSA:          1.658 m Patient Age:    89 years      BP:           151/84 mmHg Patient Gender: F             HR:           90 bpm. Exam Location:  Inpatient Procedure: 2D Echo, Cardiac Doppler and Color Doppler Indications:    CHF Diastolic  History:        Patient has prior history of Echocardiogram examinations. CHF,                 Arrythmias:Atrial Fibrillation; Risk Factors:Hypertension.                 Dementia.  Sonographer:    Milda Smart Referring Phys: [id number] Dailin Sosnowski K Ndia Sampath  Sonographer Comments: Image acquisition challenging due to respiratory motion. IMPRESSIONS  1. Left ventricular ejection fraction, by estimation, is 60 to 65%. The left ventricle has normal function. The left ventricle has no regional wall motion abnormalities. There is mild concentric left  ventricular hypertrophy. Left ventricular diastolic function could not be evaluated.  2. Right ventricular systolic function is mildly reduced. The right ventricular size is mildly enlarged. There is severely elevated pulmonary artery systolic pressure. The estimated right ventricular systolic pressure is 64.0 mmHg.  3. Left atrial size was severely dilated.  4. Right atrial size was severely dilated.  5. A small pericardial effusion is present. The pericardial effusion is localized near the right atrium. There is no evidence of cardiac tamponade.  6. The mitral valve is normal in structure. Mild mitral valve regurgitation. No evidence of mitral stenosis.  7. Tricuspid valve regurgitation is severe.  8. The aortic valve is normal in structure. Aortic valve regurgitation is not visualized. No aortic stenosis is present.  9. Aortic dilatation noted. There is borderline dilatation of the ascending aorta, measuring 37 mm. 10. The inferior vena cava is dilated in size with <50% respiratory variability, suggesting right atrial pressure of 15 mmHg. 11. Findings suggestive of restrictive cardiomyopathy. FINDINGS  Left Ventricle: Left ventricular ejection fraction, by estimation, is 60 to 65%. The left ventricle has normal function. The left ventricle has no regional wall motion abnormalities. The left ventricular internal cavity size was normal in size. There is  mild concentric left ventricular hypertrophy. Left ventricular diastolic function could not be evaluated due to atrial fibrillation. Left ventricular diastolic function could not be evaluated. Right Ventricle: The right ventricular size is mildly enlarged. No increase in right ventricular wall thickness. Right ventricular systolic function is mildly reduced. There is severely elevated pulmonary artery systolic pressure. The tricuspid regurgitant velocity is 3.50 m/s, and with an assumed right atrial pressure of 15 mmHg, the estimated right ventricular systolic  pressure is 64.0 mmHg. Left Atrium: Left atrial size was severely dilated. Right Atrium: Right atrial size was severely dilated. Pericardium: A small pericardial effusion is present. The pericardial effusion is localized near the right atrium. There is no evidence of cardiac tamponade. Mitral Valve: The mitral valve is normal in structure. Mild mitral valve regurgitation. No evidence of mitral valve stenosis. Tricuspid Valve: The tricuspid valve is normal in structure. Tricuspid valve regurgitation is severe. No evidence of tricuspid stenosis. Aortic Valve: The aortic valve is normal in structure. Aortic valve regurgitation is not visualized. No aortic stenosis is present. Pulmonic Valve: The pulmonic valve was normal in structure. Pulmonic valve regurgitation is trivial. No evidence of pulmonic stenosis. Aorta: Aortic dilatation noted. There is borderline dilatation of the ascending aorta, measuring 37 mm. Venous: The inferior vena cava is dilated in size with less than 50% respiratory variability, suggesting right atrial pressure of 15 mmHg. IAS/Shunts: No atrial level shunt detected by color flow Doppler.  LEFT VENTRICLE PLAX 2D LVIDd:         3.50 cm     Diastology LVIDs:         2.90 cm     LV e' medial:    4.20 cm/s LV PW:         1.30 cm     LV E/e' medial:  22.6 LV IVS:        1.20 cm     LV e' lateral:   8.63 cm/s LVOT diam:     2.00 cm     LV E/e' lateral: 11.0 LV SV:         46 LV SV Index:   28 LVOT Area:     3.14 cm  LV Volumes (MOD) LV vol d, MOD A2C: 42.3 ml LV vol d, MOD A4C: 42.2 ml LV vol s, MOD A2C: 15.4 ml LV vol s, MOD A4C: 14.7 ml LV SV MOD A2C:     26.9 ml LV SV MOD A4C:     42.2 ml LV SV MOD BP:      26.1 ml RIGHT VENTRICLE            IVC RV Basal diam:  3.30 cm    IVC diam: 2.10 cm RV S prime:     7.35 cm/s TAPSE (M-mode): 1.2 cm LEFT ATRIUM             Index        RIGHT ATRIUM           Index LA diam:        4.50 cm 2.71 cm/m   RA Area:     25.80 cm LA Vol (A2C):   61.3 ml 36.97 ml/m   RA Volume:   86.90 ml  52.42 ml/m LA Vol (A4C):   68.7 ml 41.44 ml/m LA Biplane Vol: 73.4 ml 44.27 ml/m  AORTIC VALVE LVOT Vmax:   92.40 cm/s LVOT Vmean:  62.500 cm/s LVOT VTI:    0.148 m  AORTA Ao Root diam: 3.20 cm Ao  Asc diam:  3.70 cm MITRAL VALVE               TRICUSPID VALVE MV Area (PHT): 4.12 cm    TR Peak grad:   49.0 mmHg MV Decel Time: 184 msec    TR Mean grad:   35.0 mmHg MR Peak grad: 17.3 mmHg    TR Vmax:        350.00 cm/s MR Vmax:      208.00 cm/s  TR Vmean:       285.0 cm/s MV E velocity: 94.90 cm/s                            SHUNTS                            Systemic VTI:  0.15 m                            Systemic Diam: 2.00 cm Arvilla Meres MD Electronically signed by Arvilla Meres MD Signature Date/Time: 02/16/2023/10:57:17 AM    Final    DG Chest 1 View  Result Date: 02/15/2023 CLINICAL DATA:  Post right-sided thoracentesis EXAM: CHEST  1 VIEW COMPARISON:  Earlier same day; 01/02/2023 FINDINGS: Grossly unchanged enlarged cardiac silhouette and mediastinal contours with atherosclerotic plaque within a tortuous and potentially ectatic thoracic aorta. Interval reduction in persistent small potentially partially loculated right-sided pleural effusion post thoracentesis. No pneumothorax. Improved aeration of the right lung base with persistent right basilar heterogeneous/consolidative opacities. Unchanged trace left-sided effusion associated left basilar opacities. No evidence of edema. No acute osseous abnormalities. Degenerative change of the right glenohumeral joint is suspected though incompletely evaluated. Stigmata of dish within the thoracic spine. IMPRESSION: 1. Interval reduction in persistent small potentially partially loculated right-sided pleural effusion post thoracentesis. No pneumothorax. 2. Improved aeration of the right lung base with persistent right basilar heterogeneous/consolidative opacities, atelectasis versus infiltrate. 3. Unchanged trace left-sided effusion and  associated left basilar opacities, atelectasis versus infiltrate. Electronically Signed   By: Simonne Come M.D.   On: 02/15/2023 15:04   US THORACENTESIS ASP PLEURAL SPACE W/IMG GUIDE  Result Date: 02/15/2023 INDICATION: Patient with history of vascular dementia, endometrial cancer, atrial fibrillation, congestive heart failure, dyspnea, pneumonia, CVA, recurrent right pleural effusion. Request received for diagnostic and therapeutic right thoracentesis. EXAM: ULTRASOUND GUIDED DIAGNOSTIC AND THERAPEUTIC RIGHT THORACENTESIS MEDICATIONS: 8 ml 1% lidocaine COMPLICATIONS: None immediate. PROCEDURE: An ultrasound guided thoracentesis was thoroughly discussed with the patient's son and questions answered. The benefits, risks, alternatives and complications were also discussed. The patient's son understands and wishes to proceed with the procedure. Written consent was obtained. Ultrasound was performed to localize and mark an adequate pocket of fluid in the right chest. The area was then prepped and draped in the normal sterile fashion. 1% Lidocaine was used for local anesthesia. Under ultrasound guidance a 6 Fr Safe-T-Centesis catheter was introduced. Thoracentesis was performed. The catheter was removed and a dressing applied. FINDINGS: A total of approximately 1.1 liters of yellow fluid was removed. Samples were sent to the laboratory as requested by the clinical team. Due to persistent patient coughing only the above amount of fluid was removed today. IMPRESSION: Successful ultrasound guided diagnostic and therapeutic right thoracentesis yielding 1.1 liters of pleural fluid. Performed by: Artemio Aly Electronically Signed   By: Simonne Come M.D.   On:  02/15/2023 15:01   DG Chest Portable 1 View  Result Date: 02/15/2023 CLINICAL DATA:  Shortness of breath EXAM: PORTABLE CHEST 1 VIEW COMPARISON:  01/08/2023 FINDINGS: Cardiac shadow is enlarged but stable. Aortic calcifications are again seen. Right-sided  pleural effusion is increased in the interval from the prior exam. Underlying consolidation is likely present as well. Left lung is clear. IMPRESSION: Increasing right-sided effusion with underlying consolidation. Electronically Signed   By: Alcide Clever M.D.   On: 02/15/2023 03:53       Donta Mcinroy M.D. [facility] 02/16/2023, 12:51 PM  Available via Epic secure chat 7am-7pm After 7 pm, please refer to night coverage provider listed on amion.

## 2023-02-16 NOTE — Consult Note (Signed)
Consultation Note Date: 02/16/2023   Patient Name: Tiffany Black  DOB: April 12, 1934  MRN: [id number]  Age / Sex: 89 y.o., female   PCP: Cleatis Polka., MD Referring Physician: Cathren Harsh, MD  Reason for Consultation: Establishing goals of care     Chief Complaint/History of Present Illness:   Patient is an 89 year old female with a past medical history of vascular dementia, HFpEF, atrial fibrillation, CVA, IBS, multiple recent ED visits, and recurrent pleural effusions who was admitted on 02/15/2023 for management of shortness of breath.  Patient was brought in from [place] where she is in [facility] Unit.  Since admission, patient has undergone thoracentesis for recurrent transudative, lymphocytic pleural effusion; pulmonary following.  Cardiology also consulted and assisting with fluid management by adjustment of medications.  Palliative medicine team consulted to assist with complex medical decision making. Of note patient was last seen by palliative medicine team in January of this year.  Extensive review of EMR prior to presenting to bedside.  Patient transition to Bumex 1 mg twice daily as per cardiology.  Patient has been referred to follow-up with pulmonology in 2-4 weeks.  As patient has not had thoracentesis since February of this year, not currently recommended to have Pleurx catheter placed.  Presented to bedside to meet with patient.  Patient sitting up comfortably in bedside chair.  Discussed care with RN.  Introduced myself to patient and role of palliative medicine team in patient's care.  Patient can state that she is forgetful so she is unsure why she is here in the hospital.  She can state that she knows she is getting older and so not surprised by her forgetfulness.  Patient able to state that she denies any concerns about pain or shortness of breath.  Patient able to state that her son Thayer Ohm is her primary support.  Noted would reach out to him to discuss her  care.  Spent time providing emotional support via active listening.  Thanked patient for allowing me to visit with her today.  After visit with patient, attempted to contact patient's son Luva Nondorf as per contact information provided in EMR.  Despite multiple attempts, no answer at this time.  Will attempt to contact son as able in the future.  Primary Diagnoses  Present on Admission:  CAP (community acquired pneumonia)  Recurrent right pleural effusion  Dementia with behavioral disturbance (HCC)  Essential hypertension  Acute respiratory failure with hypoxia (HCC)  Anemia of chronic disease  Cerebral infarction (HCC)  Persistent atrial fibrillation (HCC)  Acute on chronic diastolic CHF (congestive heart failure) (HCC)   Palliative Review of Systems: No symptoms of concern  Past Medical History:  Diagnosis Date   Anxiety    Arthritis    BPPV (benign paroxysmal positional vertigo)    Breast lesion    left   Bulging of cervical intervertebral disc    Cardiomegaly    Cataracts, bilateral    Chronic neck pain    CVA (cerebral vascular accident) (HCC)    x2 2013 and 2015; right temporal lobe and left frontal lobe infarct-> dzziness which resolved; occluded right PCA, moderate posterior left cerebral stenosis, left MCA stenosis on MRA   Dementia (HCC)    Essential hypertension    History of endometrial cancer    History of stroke    2015 and 2016 // carotid US 8/16: Bilateral 1-39 // carotid US 2/16: Bilateral ICA 1-39   HLD (hyperlipidemia)    Hx of echocardiogram  a. Echo (11/15): Mild LVH, EF 55-60%, normal wall motion, grade 2 diastolic dysfunction, mild LAE, normal RV function, PASP 29 mm Hg // echo 8/16:Mild concentric LVH, EF 55-60, normal wall motion, grade 2 diastolic dysfunction, mild LAE, PASP 39   Hypertension    IBS (irritable bowel syndrome)    Insomnia    Lung nodule    OA (osteoarthritis)    Pectus excavatum    Persistent atrial fibrillation (HCC)     Pleural effusion    right recurrent, 1/24 s/p thoracentesis x 3 (cytology negative)- Dr Francine Graven   Pneumonia due to COVID-19 virus    Radius fracture    left distal   Vertigo    Vitamin D deficiency    Social History   Socioeconomic History   Marital status: Widowed    Spouse name: Not on file   Number of children: 3   Years of education: 16   Highest education level: Not on file  Occupational History   Occupation: RetiredTherapist, nutritional  Tobacco Use   Smoking status: Never   Smokeless tobacco: Never  Vaping Use   Vaping Use: Never used  Substance and Sexual Activity   Alcohol use: No   Drug use: No   Sexual activity: Never  Other Topics Concern   Not on file  Social History Narrative   Diet: N/A   Caffeine: N/A   Married, if yes what year: Widow, married 1956   Do you live in a house, apartment, assisted living, condo, trailer, ect: House, one stories, 2 persons   Pets: None   Highest level of education: 12th Grade    Current/Past profession: Diplomatic Services operational officer    Exercise: Yes, walking everyday    Right handed   Living Will:    DNR:   POA/HPOA:   Functional Status:   Do you have difficulty bathing or dressing yourself? No   Do you have difficulty preparing food or eating? No    Do you have difficulty managing your medications? No   Do you have difficulty managing your finances? No    Do you have difficulty affording your medications? Yes   Social Determinants of Health   Financial Resource Strain: Not on file  Food Insecurity: No Food Insecurity (10/22/2022)   Hunger Vital Sign    Worried About Running Out of Food in the Last Year: Never true    Ran Out of Food in the Last Year: Never true  Transportation Needs: No Transportation Needs (10/22/2022)   PRAPARE - Administrator, Civil Service (Medical): No    Lack of Transportation (Non-Medical): No  Physical Activity: Not on file  Stress: Not on file  Social Connections: Not on file   Family History  Problem  Relation Age of Onset   Stroke Mother    Heart attack Father    Heart disease Father    Dementia Sister    CAD Brother    Stroke Maternal Uncle    Hypertension Neg Hx    Scheduled Meds:  amLODipine  10 mg Oral Daily   bumetanide (BUMEX) IV  1 mg Intravenous BID   FLUoxetine  10 mg Oral Daily   irbesartan  150 mg Oral QHS   metoprolol tartrate  50 mg Oral BID   QUEtiapine  50 mg Oral QHS   spironolactone  25 mg Oral Daily   Continuous Infusions:  azithromycin 500 mg (02/16/23 0519)   cefTRIAXone (ROCEPHIN)  IV 2 g (02/16/23 0640)   PRN Meds:.acetaminophen **OR**  acetaminophen, haloperidol lactate, hydrALAZINE, ondansetron **OR** ondansetron (ZOFRAN) IV Allergies  Allergen Reactions   Hydrochlorothiazide Hives   Penicillins Hives    Has patient had a PCN reaction causing immediate rash, facial/tongue/throat swelling, SOB or lightheadedness with hypotension: Yes Has patient had a PCN reaction causing severe rash involving mucus membranes or skin necrosis: No Has patient had a PCN reaction that required hospitalization No Has patient had a PCN reaction occurring within the last 10 years: No If all of the above answers are "NO", then may proceed with Cephalosporin use.    Ciprofloxacin Other (See Comments) and Hypertension    Blood pressure issues- patient doesn't recall if it increased OR decreased it     Doxycycline Diarrhea and Nausea And Vomiting   Hydrocodone-Acetaminophen Other (See Comments)    Unknown reaction   Iodinated Contrast Media Diarrhea, Other (See Comments) and Hypertension    Elevated the B/P   Iodine-131 Other (See Comments) and Hypertension    Raised the blood pressure   Lasix [Furosemide] Other (See Comments)    No reaction listed on order summary report from [facility]   Latex Itching   Niaspan [Niacin]     Flu (from [facility] notes)   Statins Other (See Comments)    Muscle weakness   Carbamazepine Other (See Comments) and Rash    Flushed blood pressure  medication out of system   Codeine Nausea And Vomiting   Nickel Rash   Sulfa Antibiotics Hives and Rash   Sulfamethoxazole Hives and Rash   Sulfonamide Derivatives Hives and Rash        CBC:    Component Value Date/Time   WBC 6.8 02/16/2023 0552   HGB 10.4 (L) 02/16/2023 0552   HGB 12.5 06/13/2022 1145   HCT 32.4 (L) 02/16/2023 0552   HCT 38.0 06/13/2022 1145   PLT 181 02/16/2023 0552   PLT 169 06/13/2022 1145   MCV 93.6 02/16/2023 0552   MCV 92 06/13/2022 1145   NEUTROABS 4.0 02/15/2023 0339   NEUTROABS 4.1 11/28/2017 1135   LYMPHSABS 1.3 02/15/2023 0339   LYMPHSABS 1.7 11/28/2017 1135   MONOABS 0.5 02/15/2023 0339   EOSABS 0.2 02/15/2023 0339   EOSABS 0.2 11/28/2017 1135   BASOSABS 0.0 02/15/2023 0339   BASOSABS 0.0 11/28/2017 1135   Comprehensive Metabolic Panel:    Component Value Date/Time   NA 137 02/16/2023 0552   NA 140 06/13/2022 1145   K 3.3 (L) 02/16/2023 0552   CL 102 02/16/2023 0552   CO2 25 02/16/2023 0552   BUN 18 02/16/2023 0552   BUN 17 06/13/2022 1145   CREATININE 1.05 (H) 02/16/2023 0552   CREATININE 0.81 12/15/2016 0829   GLUCOSE 92 02/16/2023 0552   CALCIUM 8.8 (L) 02/16/2023 0552   AST 37 02/16/2023 0552   ALT 47 (H) 02/16/2023 0552   ALKPHOS 68 02/16/2023 0552   BILITOT 1.3 (H) 02/16/2023 0552   PROT 5.6 (L) 02/16/2023 0552   ALBUMIN 3.1 (L) 02/16/2023 0552    Physical Exam: Vital Signs: BP (!) 151/84 (BP Location: Left Arm)   Pulse 91   Temp 98.3 F (36.8 C) (Oral)   Resp 16   Ht 5\' 5"  (1.651 m)   Wt 59.9 kg   SpO2 97%   BMI 21.97 kg/m  SpO2: SpO2: 97 % O2 Device: O2 Device: Room Air O2 Flow Rate: O2 Flow Rate (L/min): 2 L/min Intake/output summary:  Intake/Output Summary (Last 24 hours) at 02/16/2023 1110 Last data filed at 02/16/2023 0900 Gross per  24 hour  Intake 960 ml  Output 550 ml  Net 410 ml   LBM:   Baseline Weight: Weight: 59.9 kg Most recent weight: Weight: 59.9 kg  General: NAD, up in chair at bedside,  pleasantly confused, chronically ill-appearing Eyes: No drainage noted HENT: moist mucous membranes Cardiovascular: RRR Respiratory: no increased work of breathing noted, not in respiratory distress, on nasal cannula O2 Abdomen: not distended Neuro: Able to interact and answer some simple questions though overall confused and not able to participate in complex medical decision-making          Palliative Performance Scale: 50%              Additional Data Reviewed: Recent Labs    02/15/23 0339 02/16/23 0552  WBC 6.0 6.8  HGB 11.1* 10.4*  PLT 207 181  NA 136 137  BUN 19 18  CREATININE 1.22* 1.05*    Imaging: ECHOCARDIOGRAM COMPLETE    ECHOCARDIOGRAM REPORT       Patient Name:   Tiffany Black Date of Exam: 02/16/2023 Medical Rec #:  [id number]     Height:       65.0 in Accession #:    [id number]    Weight:       132.0 lb Date of Birth:  12-19-1933     BSA:          1.658 m Patient Age:    89 years      BP:           151/84 mmHg Patient Gender: F             HR:           90 bpm. Exam Location:  Inpatient  Procedure: 2D Echo, Cardiac Doppler and Color Doppler  Indications:    CHF Diastolic   History:        Patient has prior history of Echocardiogram examinations. CHF,                 Arrythmias:Atrial Fibrillation; Risk Factors:Hypertension.                 Dementia.   Sonographer:    Milda Smart Referring Phys: [id number] RIPUDEEP K RAI    Sonographer Comments: Image acquisition challenging due to respiratory motion. IMPRESSIONS   1. Left ventricular ejection fraction, by estimation, is 60 to 65%. The left ventricle has normal function. The left ventricle has no regional wall motion abnormalities. There is mild concentric left ventricular hypertrophy. Left ventricular diastolic  function could not be evaluated.  2. Right ventricular systolic function is mildly reduced. The right ventricular size is mildly enlarged. There is severely elevated pulmonary artery systolic  pressure. The estimated right ventricular systolic pressure is 64.0 mmHg.  3. Left atrial size was severely dilated.  4. Right atrial size was severely dilated.  5. A small pericardial effusion is present. The pericardial effusion is localized near the right atrium. There is no evidence of cardiac tamponade.  6. The mitral valve is normal in structure. Mild mitral valve regurgitation. No evidence of mitral stenosis.  7. Tricuspid valve regurgitation is severe.  8. The aortic valve is normal in structure. Aortic valve regurgitation is not visualized. No aortic stenosis is present.  9. Aortic dilatation noted. There is borderline dilatation of the ascending aorta, measuring 37 mm. 10. The inferior vena cava is dilated in size with <50% respiratory variability, suggesting right atrial pressure of 15 mmHg. 11. Findings suggestive of restrictive cardiomyopathy.  FINDINGS  Left Ventricle: Left ventricular ejection fraction, by estimation, is 60 to 65%. The left ventricle has normal function. The left ventricle has no regional wall motion abnormalities. The left ventricular internal cavity size was normal in size. There is  mild concentric left ventricular hypertrophy. Left ventricular diastolic function could not be evaluated due to atrial fibrillation. Left ventricular diastolic function could not be evaluated.  Right Ventricle: The right ventricular size is mildly enlarged. No increase in right ventricular wall thickness. Right ventricular systolic function is mildly reduced. There is severely elevated pulmonary artery systolic pressure. The tricuspid  regurgitant velocity is 3.50 m/s, and with an assumed right atrial pressure of 15 mmHg, the estimated right ventricular systolic pressure is 64.0 mmHg.  Left Atrium: Left atrial size was severely dilated.  Right Atrium: Right atrial size was severely dilated.  Pericardium: A small pericardial effusion is present. The pericardial effusion is localized  near the right atrium. There is no evidence of cardiac tamponade.  Mitral Valve: The mitral valve is normal in structure. Mild mitral valve regurgitation. No evidence of mitral valve stenosis.  Tricuspid Valve: The tricuspid valve is normal in structure. Tricuspid valve regurgitation is severe. No evidence of tricuspid stenosis.  Aortic Valve: The aortic valve is normal in structure. Aortic valve regurgitation is not visualized. No aortic stenosis is present.  Pulmonic Valve: The pulmonic valve was normal in structure. Pulmonic valve regurgitation is trivial. No evidence of pulmonic stenosis.  Aorta: Aortic dilatation noted. There is borderline dilatation of the ascending aorta, measuring 37 mm.  Venous: The inferior vena cava is dilated in size with less than 50% respiratory variability, suggesting right atrial pressure of 15 mmHg.  IAS/Shunts: No atrial level shunt detected by color flow Doppler.    LEFT VENTRICLE PLAX 2D LVIDd:         3.50 cm     Diastology LVIDs:         2.90 cm     LV e' medial:    4.20 cm/s LV PW:         1.30 cm     LV E/e' medial:  22.6 LV IVS:        1.20 cm     LV e' lateral:   8.63 cm/s LVOT diam:     2.00 cm     LV E/e' lateral: 11.0 LV SV:         46 LV SV Index:   28 LVOT Area:     3.14 cm   LV Volumes (MOD) LV vol d, MOD A2C: 42.3 ml LV vol d, MOD A4C: 42.2 ml LV vol s, MOD A2C: 15.4 ml LV vol s, MOD A4C: 14.7 ml LV SV MOD A2C:     26.9 ml LV SV MOD A4C:     42.2 ml LV SV MOD BP:      26.1 ml  RIGHT VENTRICLE            IVC RV Basal diam:  3.30 cm    IVC diam: 2.10 cm RV S prime:     7.35 cm/s TAPSE (M-mode): 1.2 cm  LEFT ATRIUM             Index        RIGHT ATRIUM           Index LA diam:        4.50 cm 2.71 cm/m   RA Area:     25.80 cm LA Vol (A2C):   61.3 ml 36.97  ml/m  RA Volume:   86.90 ml  52.42 ml/m LA Vol (A4C):   68.7 ml 41.44 ml/m LA Biplane Vol: 73.4 ml 44.27 ml/m  AORTIC VALVE LVOT Vmax:   92.40 cm/s LVOT Vmean:   62.500 cm/s LVOT VTI:    0.148 m   AORTA Ao Root diam: 3.20 cm Ao Asc diam:  3.70 cm  MITRAL VALVE               TRICUSPID VALVE MV Area (PHT): 4.12 cm    TR Peak grad:   49.0 mmHg MV Decel Time: 184 msec    TR Mean grad:   35.0 mmHg MR Peak grad: 17.3 mmHg    TR Vmax:        350.00 cm/s MR Vmax:      208.00 cm/s  TR Vmean:       285.0 cm/s MV E velocity: 94.90 cm/s                            SHUNTS                            Systemic VTI:  0.15 m                            Systemic Diam: 2.00 cm  Arvilla Meres MD Electronically signed by Arvilla Meres MD Signature Date/Time: 02/16/2023/10:57:17 AM      Final      I personally reviewed recent imaging.   Palliative Care Assessment and Plan Summary of Established Goals of Care and Medical Treatment Preferences   Patient is an 89 year old female with a past medical history of vascular dementia, HFpEF, atrial fibrillation, CVA, IBS, multiple recent ED visits, and recurrent pleural effusions who was admitted on 02/15/2023 for management of shortness of breath.  Patient was brought in from [place] where she is in [facility] Unit.  Since admission, patient has undergone thoracentesis for recurrent transudative, lymphocytic pleural effusion; pulmonary following.  Cardiology also consulted and assisting with fluid management by adjustment of medications.  Palliative medicine team consulted to assist with complex medical decision making. Of note patient was last seen by palliative medicine team in January of this year. At that time, PMT had provided son with MOST form for review and outpatient palliative medicine referral.   # Complex medical decision making/goals of care  -Patient unable to participate in complex medical decision making as described above in HPI.  Patient came from memory unit with underlying diagnosis of vascular dementia.  Patient denies any symptoms of concern at this time.  Patient appears to have gotten  relief from shortness of breath from thoracentesis that was performed.  Patient scheduled to follow-up with pulmonology in the outpatient setting to continue monitoring.  Patient's last thoracentesis was in February of this year so no need for Pleurx catheter placement at this time.  - Attempted to call patient's son, Lerae Counce, as per contact information listed in EMR though unable to reach by phone today.  Will attempt to follow-up as able.  -  Code Status: Full Code   # Symptom management  -As per primary team  # Psycho-social/Spiritual Support:  - Support System: son- Tiffane Strenger  # Discharge Planning:  Likely returning to memory care unit  -PMT had previously discussed outpatient palliative medicine referral with patient's son. Unable to  reach son today to discuss. Think would be appropraite referral if patient's son agreeing to it.   Thank you for allowing the palliative care team to participate in the care Tiffany Black.  Alvester Morin, DO Palliative Care Provider PMT # [phone]  If patient remains symptomatic despite maximum doses, please call PMT at [phone] between 0700 and 1900. Outside of these hours, please call attending, as PMT does not have night coverage.  This provider spent a total of 62 minutes providing patient's care.  Includes review of EMR, discussing care with other staff members involved in patient's medical care, obtaining relevant history and information from patient and/or patient's family, and personal review of imaging and lab work. Greater than 50% of the time was spent counseling and coordinating care related to the above assessment and plan.    *Please note that this is a verbal dictation therefore any spelling or grammatical errors are due to the "[employer]" system interpretation.

## 2023-02-17 DIAGNOSIS — I4819 Other persistent atrial fibrillation: Secondary | ICD-10-CM

## 2023-02-17 DIAGNOSIS — F03918 Unspecified dementia, unspecified severity, with other behavioral disturbance: Secondary | ICD-10-CM | POA: Diagnosis not present

## 2023-02-17 DIAGNOSIS — Z515 Encounter for palliative care: Secondary | ICD-10-CM | POA: Diagnosis not present

## 2023-02-17 DIAGNOSIS — I5033 Acute on chronic diastolic (congestive) heart failure: Secondary | ICD-10-CM | POA: Diagnosis not present

## 2023-02-17 DIAGNOSIS — J189 Pneumonia, unspecified organism: Secondary | ICD-10-CM | POA: Diagnosis not present

## 2023-02-17 DIAGNOSIS — Z7189 Other specified counseling: Secondary | ICD-10-CM | POA: Diagnosis not present

## 2023-02-17 DIAGNOSIS — J90 Pleural effusion, not elsewhere classified: Secondary | ICD-10-CM | POA: Diagnosis not present

## 2023-02-17 DIAGNOSIS — J9601 Acute respiratory failure with hypoxia: Secondary | ICD-10-CM | POA: Diagnosis not present

## 2023-02-17 LAB — CULTURE, BLOOD (ROUTINE X 2)

## 2023-02-17 MED ORDER — BUMETANIDE 1 MG PO TABS: 1.0000 mg | ORAL_TABLET | Freq: Every day | ORAL | Status: DC

## 2023-02-17 MED ORDER — CEFPODOXIME PROXETIL 200 MG PO TABS: 200.0000 mg | ORAL_TABLET | Freq: Two times a day (BID) | ORAL | Status: AC

## 2023-02-17 MED ORDER — AMLODIPINE BESYLATE 10 MG PO TABS: 10.0000 mg | ORAL_TABLET | Freq: Every day | ORAL | Status: AC

## 2023-02-17 MED ORDER — POTASSIUM CHLORIDE CRYS ER 20 MEQ PO TBCR: 10.0000 meq | EXTENDED_RELEASE_TABLET | Freq: Every day | ORAL | Status: DC

## 2023-02-17 MED ORDER — RIVAROXABAN 15 MG PO TABS
15.0000 mg | ORAL_TABLET | Freq: Every day | ORAL | 5 refills | Status: DC
Start: 2023-02-17 — End: 2024-01-11

## 2023-02-17 MED ORDER — LORAZEPAM 1 MG PO TABS: 1.0000 mg | ORAL_TABLET | Freq: Three times a day (TID) | ORAL | 0 refills | Status: DC | PRN

## 2023-02-17 MED ORDER — AZITHROMYCIN 500 MG PO TABS: 500.0000 mg | ORAL_TABLET | Freq: Every day | ORAL | Status: AC

## 2023-02-17 MED ORDER — BUMETANIDE 1 MG PO TABS
1.0000 mg | ORAL_TABLET | Freq: Every day | ORAL | Status: DC
  Administered 2023-02-17: 1 mg via ORAL
  Filled 2023-02-17: qty 1

## 2023-02-17 MED ORDER — AZITHROMYCIN 250 MG PO TABS
500.0000 mg | ORAL_TABLET | Freq: Every day | ORAL | Status: DC
  Administered 2023-02-17: 250 mg via ORAL
  Filled 2023-02-17: qty 2

## 2023-02-17 MED ORDER — SPIRONOLACTONE 25 MG PO TABS: 25.0000 mg | ORAL_TABLET | Freq: Every day | ORAL | Status: DC

## 2023-02-17 NOTE — [place] FL2 (Signed)
[place] MEDICAID FL2 LEVEL OF CARE FORM     IDENTIFICATION  Patient Name: Tiffany Black Birthdate: [birth year] Sex: female Admission Date (Current Location): 02/15/2023  [facility] and [place] Number:  Producer, television/film/video and Address:  [facility],  [address], [place] [address]      Provider Number:    Attending Physician Name and Address:  Cathren Harsh, MD  Relative Name and Phone Number:  Alegria Stakes (son) [phone]    Current Level of Care: Hospital Recommended Level of Care: Memory Care Prior Approval Number: [id number]  Date Approved/Denied:   PASRR Number:    Discharge Plan: Domiciliary (Rest home)    Current Diagnoses: Patient Active Problem List   Diagnosis Date Noted   Need for emotional support 02/16/2023   Counseling and coordination of care 02/16/2023   Palliative care encounter 02/16/2023   CAP (community acquired pneumonia) 02/15/2023   Acute on chronic diastolic CHF (congestive heart failure) (HCC) 02/15/2023   Acute anemia 10/20/2022   Pain and swelling of right wrist 10/02/2022   Shortness of breath 09/29/2022   Multiple fractures of ribs, right side, subsequent encounter for fracture with routine healing 09/29/2022   Goals of care, counseling/discussion 09/29/2022   Recurrent right pleural effusion 09/27/2022   Dementia with behavioral disturbance (HCC) 09/27/2022   Lung nodule 09/27/2022   COVID-19 virus infection 08/27/2022   Acute respiratory failure with hypoxia (HCC) 08/27/2022   Generalized weakness 08/27/2022   Fall 08/27/2022   Hyponatremia 08/27/2022   Stage 3a chronic kidney disease (CKD) (HCC) 08/27/2022   Anemia of chronic disease 08/27/2022   Acute encephalopathy 08/10/2022   GAD (generalized anxiety disorder) 08/10/2022   TIA (transient ischemic attack) 08/10/2022   Aphasia 08/09/2022   Atrial fibrillation with RVR (HCC) 09/07/2021   Headache syndrome 07/02/2017   Chest pain 05/23/2015   Essential  hypertension    History of stroke 05/10/2015   Dizziness 05/10/2015   Orthostatic hypotension 05/10/2015   Cerebral infarction (HCC) 05/10/2015   Hypokalemia 09/30/2013   Vertigo 02/28/2013   Sinusitis 02/28/2013   Anxiety 02/28/2013   Paroxysmal atrial fibrillation with RVR (HCC) 02/28/2013   PAC (premature atrial contraction) 04/10/2012   Abnormal EKG 01/19/2012   Persistent atrial fibrillation (HCC) 02/15/2010    Orientation RESPIRATION BLADDER Height & Weight     Self (oriented x 1 to self)  Normal Incontinent Weight: 59.9 kg Height:  5\' 5"  (165.1 cm)  BEHAVIORAL SYMPTOMS/MOOD NEUROLOGICAL BOWEL NUTRITION STATUS      Incontinent Diet (heart healthy)  AMBULATORY STATUS COMMUNICATION OF NEEDS Skin   Limited Assist Verbally Normal                       Personal Care Assistance Level of Assistance  Bathing, Feeding, Dressing Bathing Assistance: Limited assistance Feeding assistance: Independent Dressing Assistance: Limited assistance     Functional Limitations Info  Sight, Hearing, Speech Sight Info: Impaired Hearing Info: Adequate Speech Info: Adequate    SPECIAL CARE FACTORS FREQUENCY  PT (By licensed PT), OT (By licensed OT)     PT Frequency: per facility OT Frequency: per facility            Contractures Contractures Info: Not present    Additional Factors Info  Code Status, Allergies, Psychotropic Code Status Info: Full Allergies Info: Hydrochlorothiazide, Penicillins, Ciprofloxacin, Doxycycline, Hydrocodone-acetaminophen, Iodinated Contrast Media, Iodine-131, Lasix (Furosemide), Latex, Niaspan (Niacin), Statins, Carbamazepine, Codeine, Nickel, Sulfa Antibiotics, Sulfamethoxazole, Sulfonamide Derivatives Psychotropic Info: See MAR  Current Medications (02/17/2023):  This is the current hospital active medication list Current Facility-Administered Medications  Medication Dose Route Frequency Provider Last Rate Last Admin   acetaminophen  (TYLENOL) tablet 650 mg  650 mg Oral Q6H PRN Lurline Del, MD       Or   acetaminophen (TYLENOL) suppository 650 mg  650 mg Rectal Q6H PRN Lurline Del, MD       amLODipine (NORVASC) tablet 10 mg  10 mg Oral Daily Skip Mayer A, MD   10 mg at 02/17/23 0853   azithromycin (ZITHROMAX) tablet 500 mg  500 mg Oral Daily Rai, Ripudeep K, MD   250 mg at 02/17/23 0854   bumetanide (BUMEX) tablet 1 mg  1 mg Oral Daily Sande Rives, MD   1 mg at 02/17/23 0854   cefTRIAXone (ROCEPHIN) 2 g in sodium chloride 0.9 % 100 mL IVPB  2 g Intravenous Q24H Skip Mayer A, MD 200 mL/hr at 02/16/23 0641 Infusion Verify at 02/16/23 0641   FLUoxetine (PROZAC) capsule 10 mg  10 mg Oral Daily Skip Mayer A, MD   10 mg at 02/17/23 0900   haloperidol lactate (HALDOL) injection 0.5 mg  0.5 mg Intravenous Q6H PRN Rai, Ripudeep K, MD   0.5 mg at 02/15/23 2110   hydrALAZINE (APRESOLINE) injection 10 mg  10 mg Intravenous Q4H PRN Skip Mayer A, MD   10 mg at 02/15/23 1235   irbesartan (AVAPRO) tablet 150 mg  150 mg Oral QHS Skip Mayer A, MD   150 mg at 02/15/23 2115   LORazepam (ATIVAN) tablet 1 mg  1 mg Oral Q8H PRN Rai, Ripudeep K, MD   1 mg at 02/17/23 0853   metoprolol tartrate (LOPRESSOR) tablet 50 mg  50 mg Oral BID Skip Mayer A, MD   50 mg at 02/17/23 0855   ondansetron (ZOFRAN) tablet 4 mg  4 mg Oral Q6H PRN Lurline Del, MD       Or   ondansetron [facility]) injection 4 mg  4 mg Intravenous Q6H PRN Lurline Del, MD       QUEtiapine (SEROQUEL) tablet 25 mg  25 mg Oral Daily Rai, Ripudeep K, MD   25 mg at 02/17/23 0900   QUEtiapine (SEROQUEL) tablet 50 mg  50 mg Oral QHS Skip Mayer A, MD   50 mg at 02/15/23 2115   spironolactone (ALDACTONE) tablet 25 mg  25 mg Oral Daily Sande Rives, MD   25 mg at 02/17/23 0900     Discharge Medications: Please see discharge summary for a list of discharge medications.  Relevant Imaging  Results:  Relevant Lab Results:   Additional Information [id number]  Adrian Prows, RN

## 2023-02-17 NOTE — Progress Notes (Signed)
Pt refusing all po/iv meds/labs/tele monitior confused/agitated at times. Sitter at bedside

## 2023-02-17 NOTE — Discharge Summary (Signed)
Physician Discharge Summary   Patient: Tiffany Black MRN: [id number] DOB: 1934-06-24  Admit date:     02/15/2023  Discharge date: 02/17/23  Discharge Physician: Thad Ranger, MD    PCP: Cleatis Polka., MD   Recommendations at discharge:   Started on Bumex 1 mg p.o. daily Currently Xarelto on hold pending evaluation by pulmonology outpatient if she needs Pleurx for recurrent pleural effusion Ativan 1 mg p.o. every 8 hours as needed for agitation Follow bmet in 1 week Zithromax 500 mg p.o. daily for 3 days Vantin 200 mg twice daily for 3 days Recommend ongoing palliative goals of care discussion, outpatient palliative evaluation  Discharge Diagnoses:    Acute respiratory failure with hypoxia (HCC)   Recurrent right pleural effusion status post thoracentesis  CAP (community acquired pneumonia)   Acute on chronic diastolic CHF (congestive heart failure) (HCC)   Dementia with behavioral disturbance (HCC)   Essential hypertension   Persistent atrial fibrillation (HCC)   Cerebral infarction (HCC)   Anemia of chronic disease Essential hypertension Failure to thrive    Hospital Course:  Patient is a 88 year old female with vascular dementia, anxiety, CVA, HTN, history of endometrial cancer, HLP, IBS, persistent atrial fibrillation, recurrent pleural effusion, chronic diastolic CHF presented to ED via EMS from SNF with shortness of breath that started earlier in the morning. Per EMS, O2 sats were 92 to 93% on RA. Patient denied any pain, shortness of breath, cough, fevers or chills, nausea.   Assessment and Plan:   Acute respiratory failure with hypoxia (HCC) likely due to CAP PNA, right-sided pleural effusion, mild acute on chronic diastolic CHF -BNP 476.8, no leukocytosis or fevers.  Chest x-ray showed increasing right-sided effusion with underlying consolidation -- Status post thoracentesis on 6/6, 1.1 L removed, transudative      Recurrent right pleural effusion -Chest  x-ray showed increasing right-sided effusion. -s/p paracentesis on 6/6, 1.1 L removed, labs transudative -Appreciate pulmonology recommendations, if pleural effusion recurs this admission, then will consider Pleurx, outpatient follow-up with Dr. Francine Graven in 2 to 4 weeks.  Xarelto currently on hold. -Patient has pulmonology follow-up scheduled on 03/06/2023 at 11:30 AM     CAP (community acquired pneumonia) -Possible aspiration issues as well given right-sided consolidation and recurrent right pleural effusion -SLP evaluation done, moderate aspiration risk, recommended to regular diet with thin liquids -Patient was placed on IV Zithromax and Rocephin while inpatient, transitioned to oral Zithromax and Vantin for 3 more days to complete full course.     Acute on chronic diastolic CHF (congestive heart failure) (HCC) -BNP 476, presented with hypoxia and shortness of breath -2D echo showed EF of 60 to 65%, LVH, diastolic function could not be evaluated.  Small pericardial effusion, bilateral atrial enlargement.   -Patient was placed on IV Bumex 1 mg twice daily, Aldactone.  (Allergic to furosemide) -Continue Lopressor, ARB, transitioned to oral Bumex 1 mg daily -Outpatient follow-up with cardiology, follow bmet     Persistent atrial fibrillation (HCC) CHA2DS2Vasc is 7, currently not on [facility], has been on hold -Currently on Lopressor 50 mg twice daily   Hypertension, uncontrolled -BP stable, continue ARB, amlodipine, metoprolol      Dementia with behavioral disturbance (HCC) -Continue Seroquel, fluoxetine -Added oral Ativan as needed for agitation  -Patient has advanced dementia, recommend ongoing palliative discussions for goals of care.  Also recommend outpatient palliative medicine evaluation. -Palliative medicine was consulted while inpatient however they were not able to reach the son.   History of cerebral  infarction [facility]) -Currently anticoagulation on hold due to consideration of possible  Pleurx outpatient if recurrent thoracentesis.  -Continue BP control     Anemia of chronic disease -Hemoglobin baseline ~11 -Currently at baseline, no acute issues     Estimated body mass index is 21.97 kg/m as calculated from the following:   Height as of this encounter: 5\' 5"  (1.651 m).   Weight as of this encounter: 59.9 kg.       Pain control - [employer] Controlled Substance Reporting System database was reviewed. and patient was instructed, not to drive, operate heavy machinery, perform activities at heights, swimming or participation in water activities or provide baby-sitting services while on Pain, Sleep and Anxiety Medications; until their outpatient Physician has advised to do so again. Also recommended to not to take more than prescribed Pain, Sleep and Anxiety Medications.  Consultants: Pulmonology, cardiology, IR Procedures performed: Right-sided thoracentesis Disposition: Skilled nursing facility Diet recommendation:  Discharge Diet Orders (From admission, onward)     Start     Ordered   02/17/23 0000  Diet - low sodium heart healthy        02/17/23 1310            DISCHARGE MEDICATION: Allergies as of 02/17/2023       Reactions   Hydrochlorothiazide Hives   Penicillins Hives   Has patient had a PCN reaction causing immediate rash, facial/tongue/throat swelling, SOB or lightheadedness with hypotension: Yes Has patient had a PCN reaction causing severe rash involving mucus membranes or skin necrosis: No Has patient had a PCN reaction that required hospitalization No Has patient had a PCN reaction occurring within the last 10 years: No If all of the above answers are "NO", then may proceed with Cephalosporin use.   Ciprofloxacin Other (See Comments), Hypertension   Blood pressure issues- patient doesn't recall if it increased OR decreased it   Doxycycline Diarrhea, Nausea And Vomiting   Hydrocodone-acetaminophen Other (See Comments)   Unknown reaction    Iodinated Contrast Media Diarrhea, Other (See Comments), Hypertension   Elevated the B/P   Iodine-131 Other (See Comments), Hypertension   Raised the blood pressure   Lasix [furosemide] Other (See Comments)   No reaction listed on order summary report from [facility]   Latex Itching   Niaspan [niacin]    Flu (from [facility] notes)   Statins Other (See Comments)   Muscle weakness   Carbamazepine Other (See Comments), Rash   Flushed blood pressure medication out of system   Codeine Nausea And Vomiting   Nickel Rash   Sulfa Antibiotics Hives, Rash   Sulfamethoxazole Hives, Rash   Sulfonamide Derivatives Hives, Rash           Medication List     STOP taking these medications    cephALEXin 500 MG capsule Commonly known as: KEFLEX   felodipine 5 MG 24 hr tablet Commonly known as: PLENDIL       TAKE these medications    acetaminophen 325 MG tablet Commonly known as: TYLENOL Take 650 mg by mouth every 6 (six) hours as needed for moderate pain.   amLODipine 10 MG tablet Commonly known as: NORVASC Take 1 tablet (10 mg total) by mouth daily. Start taking on: February 18, 2023   azithromycin 500 MG tablet Commonly known as: ZITHROMAX Take 1 tablet (500 mg total) by mouth daily for 3 days.   bumetanide 1 MG tablet Commonly known as: BUMEX Take 1 tablet (1 mg total) by mouth daily. Start  taking on: February 18, 2023   cefpodoxime 200 MG tablet Commonly known as: VANTIN Take 1 tablet (200 mg total) by mouth 2 (two) times daily for 3 days.   FLUoxetine 10 MG capsule Commonly known as: PROZAC Take 10 mg by mouth daily.   LORazepam 1 MG tablet Commonly known as: ATIVAN Take 1 tablet (1 mg total) by mouth every 8 (eight) hours as needed for anxiety (agitation).   metoprolol tartrate 50 MG tablet Commonly known as: LOPRESSOR Take 50 mg by mouth 2 (two) times daily.   nystatin powder Commonly known as: MYCOSTATIN/NYSTOP Apply 1 Application topically 2 (two) times daily.   potassium  chloride SA 20 MEQ tablet Commonly known as: KLOR-CON M Take 0.5 tablets (10 mEq total) by mouth daily.   QUEtiapine 50 MG tablet Commonly known as: SEROQUEL Take 1 tablet (50 mg total) by mouth at bedtime.   Rivaroxaban 15 MG Tabs tablet Commonly known as: Xarelto Take 1 tablet (15 mg total) by mouth daily with supper. Currently on hold What changed: additional instructions   senna-docusate 8.6-50 MG tablet Commonly known as: Senokot-S Take 1 tablet by mouth at bedtime as needed for mild constipation.   spironolactone 25 MG tablet Commonly known as: ALDACTONE Take 1 tablet (25 mg total) by mouth daily. Start taking on: February 18, 2023   valsartan 160 MG tablet Commonly known as: DIOVAN Take 80 mg by mouth 2 (two) times daily.        Follow-up Information     Cleatis Polka., MD. Schedule an appointment as soon as possible for a visit in 2 week(s).   Specialty: Internal Medicine Why: for hospital follow-up Contact information: [address] [place] [address] [phone]         Noemi Chapel, NP Follow up on 03/06/2023.   Specialty: Nurse Practitioner Why: At 11:30 AM, for hospital follow-up Contact information: [address] [place] [address] [phone]         Duke Salvia, MD. Schedule an appointment as soon as possible for a visit in 2 week(s).   Specialty: Cardiology Why: for hospital follow-up Contact information: [address] [place] [address] [phone]                Discharge Exam: Ceasar Mons Weights   02/15/23 0319  Weight: 59.9 kg   S: Sitting at the edge of the bed, wants to be discharged.  Has dementia, difficult to obtain review of system from the patient.  Appears comfortable, not on any O2.  Lungs clear  Physical Exam General: Alert and awake, NAD Cardiovascular: S1 S2 clear, RRR.  Respiratory: Fairly CTAB Gastrointestinal: Soft, nontender, nondistended, NBS Ext: no  pedal edema bilaterally Neuro: no new deficits, ambulating Psych: has dementia GI  Condition at discharge: fair  The results of significant diagnostics from this hospitalization (including imaging, microbiology, ancillary and laboratory) are listed below for reference.   Imaging Studies: ECHOCARDIOGRAM COMPLETE  Result Date: 02/16/2023    ECHOCARDIOGRAM REPORT   Patient Name:   CHANTELL LAWRY Date of Exam: 02/16/2023 Medical Rec #:  [id number]     Height:       65.0 in Accession #:    [id number]    Weight:       132.0 lb Date of Birth:  1934-08-12     BSA:          1.658 m Patient Age:    71 years  BP:           151/84 mmHg Patient Gender: F             HR:           90 bpm. Exam Location:  Inpatient Procedure: 2D Echo, Cardiac Doppler and Color Doppler Indications:    CHF Diastolic  History:        Patient has prior history of Echocardiogram examinations. CHF,                 Arrythmias:Atrial Fibrillation; Risk Factors:Hypertension.                 Dementia.  Sonographer:    Milda Smart Referring Phys: [id number] [place]  Sonographer Comments: Image acquisition challenging due to respiratory motion. IMPRESSIONS  1. Left ventricular ejection fraction, by estimation, is 60 to 65%. The left ventricle has normal function. The left ventricle has no regional wall motion abnormalities. There is mild concentric left ventricular hypertrophy. Left ventricular diastolic function could not be evaluated.  2. Right ventricular systolic function is mildly reduced. The right ventricular size is mildly enlarged. There is severely elevated pulmonary artery systolic pressure. The estimated right ventricular systolic pressure is 64.0 mmHg.  3. Left atrial size was severely dilated.  4. Right atrial size was severely dilated.  5. A small pericardial effusion is present. The pericardial effusion is localized near the right atrium. There is no evidence of cardiac tamponade.  6. The mitral valve is normal in structure.  Mild mitral valve regurgitation. No evidence of mitral stenosis.  7. Tricuspid valve regurgitation is severe.  8. The aortic valve is normal in structure. Aortic valve regurgitation is not visualized. No aortic stenosis is present.  9. Aortic dilatation noted. There is borderline dilatation of the ascending aorta, measuring 37 mm. 10. The inferior vena cava is dilated in size with <50% respiratory variability, suggesting right atrial pressure of 15 mmHg. 11. Findings suggestive of restrictive cardiomyopathy. FINDINGS  Left Ventricle: Left ventricular ejection fraction, by estimation, is 60 to 65%. The left ventricle has normal function. The left ventricle has no regional wall motion abnormalities. The left ventricular internal cavity size was normal in size. There is  mild concentric left ventricular hypertrophy. Left ventricular diastolic function could not be evaluated due to atrial fibrillation. Left ventricular diastolic function could not be evaluated. Right Ventricle: The right ventricular size is mildly enlarged. No increase in right ventricular wall thickness. Right ventricular systolic function is mildly reduced. There is severely elevated pulmonary artery systolic pressure. The tricuspid regurgitant velocity is 3.50 m/s, and with an assumed right atrial pressure of 15 mmHg, the estimated right ventricular systolic pressure is 64.0 mmHg. Left Atrium: Left atrial size was severely dilated. Right Atrium: Right atrial size was severely dilated. Pericardium: A small pericardial effusion is present. The pericardial effusion is localized near the right atrium. There is no evidence of cardiac tamponade. Mitral Valve: The mitral valve is normal in structure. Mild mitral valve regurgitation. No evidence of mitral valve stenosis. Tricuspid Valve: The tricuspid valve is normal in structure. Tricuspid valve regurgitation is severe. No evidence of tricuspid stenosis. Aortic Valve: The aortic valve is normal in  structure. Aortic valve regurgitation is not visualized. No aortic stenosis is present. Pulmonic Valve: The pulmonic valve was normal in structure. Pulmonic valve regurgitation is trivial. No evidence of pulmonic stenosis. Aorta: Aortic dilatation noted. There is borderline dilatation of the ascending aorta, measuring 37 mm. Venous: The inferior vena  cava is dilated in size with less than 50% respiratory variability, suggesting right atrial pressure of 15 mmHg. IAS/Shunts: No atrial level shunt detected by color flow Doppler.  LEFT VENTRICLE PLAX 2D LVIDd:         3.50 cm     Diastology LVIDs:         2.90 cm     LV e' medial:    4.20 cm/s LV PW:         1.30 cm     LV E/e' medial:  22.6 LV IVS:        1.20 cm     LV e' lateral:   8.63 cm/s LVOT diam:     2.00 cm     LV E/e' lateral: 11.0 LV SV:         46 LV SV Index:   28 LVOT Area:     3.14 cm  LV Volumes (MOD) LV vol d, MOD A2C: 42.3 ml LV vol d, MOD A4C: 42.2 ml LV vol s, MOD A2C: 15.4 ml LV vol s, MOD A4C: 14.7 ml LV SV MOD A2C:     26.9 ml LV SV MOD A4C:     42.2 ml LV SV MOD BP:      26.1 ml RIGHT VENTRICLE            IVC RV Basal diam:  3.30 cm    IVC diam: 2.10 cm RV S prime:     7.35 cm/s TAPSE (M-mode): 1.2 cm LEFT ATRIUM             Index        RIGHT ATRIUM           Index LA diam:        4.50 cm 2.71 cm/m   RA Area:     25.80 cm LA Vol (A2C):   61.3 ml 36.97 ml/m  RA Volume:   86.90 ml  52.42 ml/m LA Vol (A4C):   68.7 ml 41.44 ml/m LA Biplane Vol: 73.4 ml 44.27 ml/m  AORTIC VALVE LVOT Vmax:   92.40 cm/s LVOT Vmean:  62.500 cm/s LVOT VTI:    0.148 m  AORTA Ao Root diam: 3.20 cm Ao Asc diam:  3.70 cm MITRAL VALVE               TRICUSPID VALVE MV Area (PHT): 4.12 cm    TR Peak grad:   49.0 mmHg MV Decel Time: 184 msec    TR Mean grad:   35.0 mmHg MR Peak grad: 17.3 mmHg    TR Vmax:        350.00 cm/s MR Vmax:      208.00 cm/s  TR Vmean:       285.0 cm/s MV E velocity: 94.90 cm/s                            SHUNTS                             Systemic VTI:  0.15 m                            Systemic Diam: 2.00 cm Arvilla Meres MD Electronically signed by Arvilla Meres MD Signature Date/Time: 02/16/2023/10:57:17 AM    Final    DG Chest 1 View  Result Date: 02/15/2023 CLINICAL DATA:  Post right-sided thoracentesis EXAM: CHEST  1 VIEW COMPARISON:  Earlier same day; 01/02/2023 FINDINGS: Grossly unchanged enlarged cardiac silhouette and mediastinal contours with atherosclerotic plaque within a tortuous and potentially ectatic thoracic aorta. Interval reduction in persistent small potentially partially loculated right-sided pleural effusion post thoracentesis. No pneumothorax. Improved aeration of the right lung base with persistent right basilar heterogeneous/consolidative opacities. Unchanged trace left-sided effusion associated left basilar opacities. No evidence of edema. No acute osseous abnormalities. Degenerative change of the right glenohumeral joint is suspected though incompletely evaluated. Stigmata of dish within the thoracic spine. IMPRESSION: 1. Interval reduction in persistent small potentially partially loculated right-sided pleural effusion post thoracentesis. No pneumothorax. 2. Improved aeration of the right lung base with persistent right basilar heterogeneous/consolidative opacities, atelectasis versus infiltrate. 3. Unchanged trace left-sided effusion and associated left basilar opacities, atelectasis versus infiltrate. Electronically Signed   By: Simonne Come M.D.   On: 02/15/2023 15:04   US THORACENTESIS ASP PLEURAL SPACE W/IMG GUIDE  Result Date: 02/15/2023 INDICATION: Patient with history of vascular dementia, endometrial cancer, atrial fibrillation, congestive heart failure, dyspnea, pneumonia, CVA, recurrent right pleural effusion. Request received for diagnostic and therapeutic right thoracentesis. EXAM: ULTRASOUND GUIDED DIAGNOSTIC AND THERAPEUTIC RIGHT THORACENTESIS MEDICATIONS: 8 ml 1% lidocaine COMPLICATIONS: None  immediate. PROCEDURE: An ultrasound guided thoracentesis was thoroughly discussed with the patient's son and questions answered. The benefits, risks, alternatives and complications were also discussed. The patient's son understands and wishes to proceed with the procedure. Written consent was obtained. Ultrasound was performed to localize and mark an adequate pocket of fluid in the right chest. The area was then prepped and draped in the normal sterile fashion. 1% Lidocaine was used for local anesthesia. Under ultrasound guidance a 6 Fr Safe-T-Centesis catheter was introduced. Thoracentesis was performed. The catheter was removed and a dressing applied. FINDINGS: A total of approximately 1.1 liters of yellow fluid was removed. Samples were sent to the laboratory as requested by the clinical team. Due to persistent patient coughing only the above amount of fluid was removed today. IMPRESSION: Successful ultrasound guided diagnostic and therapeutic right thoracentesis yielding 1.1 liters of pleural fluid. Performed by: Artemio Aly Electronically Signed   By: Simonne Come M.D.   On: 02/15/2023 15:01   DG Chest Portable 1 View  Result Date: 02/15/2023 CLINICAL DATA:  Shortness of breath EXAM: PORTABLE CHEST 1 VIEW COMPARISON:  01/08/2023 FINDINGS: Cardiac shadow is enlarged but stable. Aortic calcifications are again seen. Right-sided pleural effusion is increased in the interval from the prior exam. Underlying consolidation is likely present as well. Left lung is clear. IMPRESSION: Increasing right-sided effusion with underlying consolidation. Electronically Signed   By: Alcide Clever M.D.   On: 02/15/2023 03:53    Microbiology: Results for orders placed or performed during the hospital encounter of 02/15/23  Resp panel by RT-PCR (RSV, Flu A&B, Covid) Anterior Nasal Swab     Status: None   Collection Time: 02/15/23  3:30 AM   Specimen: Anterior Nasal Swab  Result Value Ref Range Status   SARS Coronavirus  2 by RT PCR NEGATIVE NEGATIVE Final    Comment: (NOTE) SARS-CoV-2 target nucleic acids are NOT DETECTED.  The SARS-CoV-2 RNA is generally detectable in upper respiratory specimens during the acute phase of infection. The lowest concentration of SARS-CoV-2 viral copies this assay can detect is 138 copies/mL. A negative result does not preclude SARS-Cov-2 infection and should not be used as the sole basis for treatment or other patient management decisions. A negative result  may occur with  improper specimen collection/handling, submission of specimen other than nasopharyngeal swab, presence of viral mutation(s) within the areas targeted by this assay, and inadequate number of viral copies(<138 copies/mL). A negative result must be combined with clinical observations, patient history, and epidemiological information. The expected result is Negative.  Fact Sheet for Patients:  BloggerCourse.com  Fact Sheet for Healthcare Providers:  SeriousBroker.it  This test is no t yet approved or cleared by the [place] FDA and  has been authorized for detection and/or diagnosis of SARS-CoV-2 by FDA under an Emergency Use Authorization (EUA). This EUA will remain  in effect (meaning this test can be used) for the duration of the COVID-19 declaration under Section 564(b)(1) of the Act, 21 U.S.C.section 360bbb-3(b)(1), unless the authorization is terminated  or revoked sooner.       Influenza A by PCR NEGATIVE NEGATIVE Final   Influenza B by PCR NEGATIVE NEGATIVE Final    Comment: (NOTE) The Xpert Xpress SARS-CoV-2/FLU/RSV plus assay is intended as an aid in the diagnosis of influenza from Nasopharyngeal swab specimens and should not be used as a sole basis for treatment. Nasal washings and aspirates are unacceptable for Xpert Xpress SARS-CoV-2/FLU/RSV testing.  Fact Sheet for Patients: BloggerCourse.com  Fact  Sheet for Healthcare Providers: SeriousBroker.it  This test is not yet approved or cleared by the [place] FDA and has been authorized for detection and/or diagnosis of SARS-CoV-2 by FDA under an Emergency Use Authorization (EUA). This EUA will remain in effect (meaning this test can be used) for the duration of the COVID-19 declaration under Section 564(b)(1) of the Act, 21 U.S.C. section 360bbb-3(b)(1), unless the authorization is terminated or revoked.     Resp Syncytial Virus by PCR NEGATIVE NEGATIVE Final    Comment: (NOTE) Fact Sheet for Patients: BloggerCourse.com  Fact Sheet for Healthcare Providers: SeriousBroker.it  This test is not yet approved or cleared by the [place] FDA and has been authorized for detection and/or diagnosis of SARS-CoV-2 by FDA under an Emergency Use Authorization (EUA). This EUA will remain in effect (meaning this test can be used) for the duration of the COVID-19 declaration under Section 564(b)(1) of the Act, 21 U.S.C. section 360bbb-3(b)(1), unless the authorization is terminated or revoked.  Performed at [facility], [address], [place], [place] [address]   Blood culture (routine x 2)     Status: None (Preliminary result)   Collection Time: 02/15/23  4:24 AM   Specimen: BLOOD  Result Value Ref Range Status   Specimen Description   Final    BLOOD RIGHT ANTECUBITAL Performed at [facility], [address] [place], [place] [address]    Special Requests   Final    BOTTLES DRAWN AEROBIC AND ANAEROBIC Blood Culture adequate volume Performed at [facility], [address], [place], [place] [address]    Culture   Final    NO GROWTH 2 DAYS Performed at [facility], [address], [place], [place] [address]    Report Status PENDING  Incomplete  Blood culture (routine x 2)     Status:  None (Preliminary result)   Collection Time: 02/15/23  4:29 AM   Specimen: BLOOD  Result Value Ref Range Status   Specimen Description   Final    BLOOD BLOOD RIGHT FOREARM Performed at [facility], [address], [place], [place] [address]    Special Requests   Final    BOTTLES DRAWN  AEROBIC AND ANAEROBIC Blood Culture adequate volume Performed at [facility], [address] [place], [place] [address]    Culture   Final    NO GROWTH 2 DAYS Performed at [facility], [address], [place], [place] [address]    Report Status PENDING  Incomplete  Culture, body fluid w Gram Stain-bottle     Status: None (Preliminary result)   Collection Time: 02/15/23  2:47 PM   Specimen: Fluid  Result Value Ref Range Status   Specimen Description FLUID PLEURAL  Final   Special Requests BOTTLES DRAWN AEROBIC AND ANAEROBIC  Final   Culture   Final    NO GROWTH 2 DAYS Performed at [facility], [address] [place], [place] [address]    Report Status PENDING  Incomplete  Gram stain     Status: None   Collection Time: 02/15/23  2:47 PM   Specimen: Fluid  Result Value Ref Range Status   Specimen Description FLUID PLEURAL  Final   Special Requests NONE  Final   Gram Stain   Final    WBC PRESENT, PREDOMINANTLY MONONUCLEAR NO ORGANISMS SEEN CYTOSPIN SMEAR Performed at [facility], [address] [place], [place] [address]    Report Status 02/15/2023 FINAL  Final    Labs: CBC: Recent Labs  Lab 02/15/23 0339 02/16/23 0552  WBC 6.0 6.8  NEUTROABS 4.0  --   HGB 11.1* 10.4*  HCT 36.0 32.4*  MCV 95.2 93.6  PLT 207 181   Basic Metabolic Panel: Recent Labs  Lab 02/15/23 0339 02/16/23 0552  NA 136 137  K 4.0 3.3*  CL 105 102  CO2 21* 25  GLUCOSE 115* 92  BUN 19 18  CREATININE 1.22* 1.05*  CALCIUM 9.1 8.8*   Liver Function Tests: Recent Labs  Lab 02/16/23 0552  AST 37  ALT 47*  ALKPHOS 68  BILITOT 1.3*  PROT  5.6*  ALBUMIN 3.1*   CBG: Recent Labs  Lab 02/15/23 0814 02/16/23 0821  GLUCAP 95 88    Discharge time spent: greater than 30 minutes.  Signed: Thad Ranger, MD [facility] 02/17/2023

## 2023-02-17 NOTE — Progress Notes (Signed)
Discharged off unit via ptar at 2015

## 2023-02-17 NOTE — TOC Transition Note (Addendum)
Transition of Care [facility]) - CM/SW Discharge Note   Patient Details  Name: Tiffany Black MRN: [id number] Date of Birth: 1933/11/03  Transition of [facility]) CM/SW Contact:  Adrian Prows, RN Phone Number: 02/17/2023, 12:44 PM   Clinical Narrative:    Notified pt ready for d/c; pt from [facility]; spoke w/ Summer at agency and she says pt can return today; she gave ; RM #15, call report # [phone] (Summer); she says updated FL2 and d/c summary needed; Dr Isidoro Donning notified; spoke w/ pt's son Camielle Sizer and he agrees w/ d/c plan; pt will transport by PTAR; awaiting documents then will call PTAR; awaiting signed FL2 and d/c summary before calling PTAR.  -1325- documents received; [facility] called at 1326; spoke w/ operator # [phone]; no TOC needs    Final next level of care: Memory Care Barriers to Discharge: No Barriers Identified   Patient Goals and CMS Choice CMS Medicare.gov Compare Post Acute Care list provided to:: Legal Guardian Choice offered to / list presented to : [facility] POA / Guardian, Adult Children  Discharge Placement                  Patient to be transferred to facility by: PTAR Name of family member notified: Jimmy Stipes (son) [phone] Patient and family notified of of transfer: 02/17/23  Discharge Plan and Services Additional resources added to the After Visit Summary for   In-house Referral: NA Discharge Planning Services: NA Post Acute Care Choice: Resumption of Svcs/PTA Provider          DME Arranged: N/A DME Agency: NA                  Social Determinants of Health (SDOH) Interventions SDOH Screenings   Food Insecurity: No Food Insecurity (10/22/2022)  Housing: Low Risk  (10/22/2022)  Transportation Needs: No Transportation Needs (10/22/2022)  Utilities: Not At Risk (10/22/2022)  Tobacco Use: Low Risk  (02/15/2023)     Readmission Risk Interventions    08/29/2022   10:19 AM  Readmission Risk Prevention Plan  Transportation  Screening Complete  Medication Review (RN Care Manager) Complete  PCP or Specialist appointment within 3-5 days of discharge Complete  HRI or Home Care Consult Complete  SW Recovery Care/Counseling Consult Complete  Palliative Care Screening Not Applicable  Skilled Nursing Facility Complete

## 2023-02-17 NOTE — Progress Notes (Signed)
Cardiology Progress Note  Patient ID: Tiffany Black MRN: [id number] DOB: [birth year] Date of Encounter: 02/17/2023  Primary Cardiologist: None  Subjective   Chief Complaint: Agitation   HPI: Agitated this morning.  Breathing comfortably without oxygen.  Volume status much improved.  Suspect intake and output is not accurate  ROS:  All other ROS reviewed and negative. Pertinent positives noted in the HPI.     Inpatient Medications  Scheduled Meds:  amLODipine  10 mg Oral Daily   azithromycin  500 mg Oral Daily   bumetanide  1 mg Oral Daily   FLUoxetine  10 mg Oral Daily   irbesartan  150 mg Oral QHS   metoprolol tartrate  50 mg Oral BID   QUEtiapine  25 mg Oral Daily   QUEtiapine  50 mg Oral QHS   spironolactone  25 mg Oral Daily   Continuous Infusions:  cefTRIAXone (ROCEPHIN)  IV 200 mL/hr at 02/16/23 0641   PRN Meds: acetaminophen **OR** acetaminophen, haloperidol lactate, hydrALAZINE, LORazepam, ondansetron **OR** ondansetron (ZOFRAN) IV   Vital Signs   Vitals:   02/16/23 0510 02/16/23 1434 02/16/23 1927 02/17/23 0538  BP: (!) 151/84 (!) 148/87 136/84 (!) 165/98  Pulse: 91 82 77 90  Resp: 16 19 16 17   Temp: 98.3 F (36.8 C) 97.9 F (36.6 C) 97.9 F (36.6 C) 98.1 F (36.7 C)  TempSrc: Oral Oral Oral Oral  SpO2: 97% 100% 100% 91%  Weight:      Height:        Intake/Output Summary (Last 24 hours) at 02/17/2023 0805 Last data filed at 02/17/2023 0749 Gross per 24 hour  Intake 849.34 ml  Output 550 ml  Net 299.34 ml      02/15/2023    3:19 AM 01/14/2023    3:11 AM 12/26/2022    8:32 AM  Last 3 Weights  Weight (lbs) 132 lb 132 lb 4.4 oz 132 lb  Weight (kg) 59.875 kg 60 kg 59.875 kg      Physical Exam   Vitals:   02/16/23 0510 02/16/23 1434 02/16/23 1927 02/17/23 0538  BP: (!) 151/84 (!) 148/87 136/84 (!) 165/98  Pulse: 91 82 77 90  Resp: 16 19 16 17   Temp: 98.3 F (36.8 C) 97.9 F (36.6 C) 97.9 F (36.6 C) 98.1 F (36.7 C)  TempSrc: Oral Oral Oral  Oral  SpO2: 97% 100% 100% 91%  Weight:      Height:        Intake/Output Summary (Last 24 hours) at 02/17/2023 0805 Last data filed at 02/17/2023 0749 Gross per 24 hour  Intake 849.34 ml  Output 550 ml  Net 299.34 ml       02/15/2023    3:19 AM 01/14/2023    3:11 AM 12/26/2022    8:32 AM  Last 3 Weights  Weight (lbs) 132 lb 132 lb 4.4 oz 132 lb  Weight (kg) 59.875 kg 60 kg 59.875 kg    Body mass index is 21.97 kg/m.  General: Well nourished, well developed, in no acute distress Head: Atraumatic, normal size  Eyes: PEERLA, EOMI  Neck: Supple, no JVD Endocrine: No thryomegaly Cardiac: Irregular rhythm Lungs: Diminished breath sounds bilaterally Abd: Soft, nontender, no hepatomegaly  Ext: No edema, pulses 2+ Musculoskeletal: No deformities, BUE and BLE strength normal and equal Skin: Warm and dry, no rashes   Neuro: Alert, awake, oriented to person only  Labs  High Sensitivity Troponin:   Recent Labs  Lab 02/15/23 0339 02/15/23 0529  TROPONINIHS 13  17     Cardiac EnzymesNo results for input(s): "TROPONINI" in the last 168 hours. No results for input(s): "TROPIPOC" in the last 168 hours.  Chemistry Recent Labs  Lab 02/15/23 0339 02/16/23 0552  NA 136 137  K 4.0 3.3*  CL 105 102  CO2 21* 25  GLUCOSE 115* 92  BUN 19 18  CREATININE 1.22* 1.05*  CALCIUM 9.1 8.8*  PROT  --  5.6*  ALBUMIN  --  3.1*  AST  --  37  ALT  --  47*  ALKPHOS  --  68  BILITOT  --  1.3*  GFRNONAA 43* 51*  ANIONGAP 10 10    Hematology Recent Labs  Lab 02/15/23 0339 02/16/23 0552  WBC 6.0 6.8  RBC 3.78* 3.46*  HGB 11.1* 10.4*  HCT 36.0 32.4*  MCV 95.2 93.6  MCH 29.4 30.1  MCHC 30.8 32.1  RDW 15.2 14.8  PLT 207 181   BNP Recent Labs  Lab 02/15/23 0340  BNP 476.8*    DDimer No results for input(s): "DDIMER" in the last 168 hours.   Radiology  ECHOCARDIOGRAM COMPLETE  Result Date: 02/16/2023    ECHOCARDIOGRAM REPORT   Patient Name:   Tiffany Black Date of Exam: 02/16/2023  Medical Rec #:  [id number]     Height:       65.0 in Accession #:    [id number]    Weight:       132.0 lb Date of Birth:  04/26/1934     BSA:          1.658 m Patient Age:    89 years      BP:           151/84 mmHg Patient Gender: F             HR:           90 bpm. Exam Location:  Inpatient Procedure: 2D Echo, Cardiac Doppler and Color Doppler Indications:    CHF Diastolic  History:        Patient has prior history of Echocardiogram examinations. CHF,                 Arrythmias:Atrial Fibrillation; Risk Factors:Hypertension.                 Dementia.  Sonographer:    Milda Smart Referring Phys: [id number] RIPUDEEP K RAI  Sonographer Comments: Image acquisition challenging due to respiratory motion. IMPRESSIONS  1. Left ventricular ejection fraction, by estimation, is 60 to 65%. The left ventricle has normal function. The left ventricle has no regional wall motion abnormalities. There is mild concentric left ventricular hypertrophy. Left ventricular diastolic function could not be evaluated.  2. Right ventricular systolic function is mildly reduced. The right ventricular size is mildly enlarged. There is severely elevated pulmonary artery systolic pressure. The estimated right ventricular systolic pressure is 64.0 mmHg.  3. Left atrial size was severely dilated.  4. Right atrial size was severely dilated.  5. A small pericardial effusion is present. The pericardial effusion is localized near the right atrium. There is no evidence of cardiac tamponade.  6. The mitral valve is normal in structure. Mild mitral valve regurgitation. No evidence of mitral stenosis.  7. Tricuspid valve regurgitation is severe.  8. The aortic valve is normal in structure. Aortic valve regurgitation is not visualized. No aortic stenosis is present.  9. Aortic dilatation noted. There is borderline dilatation of the ascending aorta, measuring 37 mm. 10. The inferior vena  cava is dilated in size with <50% respiratory variability, suggesting right  atrial pressure of 15 mmHg. 11. Findings suggestive of restrictive cardiomyopathy. FINDINGS  Left Ventricle: Left ventricular ejection fraction, by estimation, is 60 to 65%. The left ventricle has normal function. The left ventricle has no regional wall motion abnormalities. The left ventricular internal cavity size was normal in size. There is  mild concentric left ventricular hypertrophy. Left ventricular diastolic function could not be evaluated due to atrial fibrillation. Left ventricular diastolic function could not be evaluated. Right Ventricle: The right ventricular size is mildly enlarged. No increase in right ventricular wall thickness. Right ventricular systolic function is mildly reduced. There is severely elevated pulmonary artery systolic pressure. The tricuspid regurgitant velocity is 3.50 m/s, and with an assumed right atrial pressure of 15 mmHg, the estimated right ventricular systolic pressure is 64.0 mmHg. Left Atrium: Left atrial size was severely dilated. Right Atrium: Right atrial size was severely dilated. Pericardium: A small pericardial effusion is present. The pericardial effusion is localized near the right atrium. There is no evidence of cardiac tamponade. Mitral Valve: The mitral valve is normal in structure. Mild mitral valve regurgitation. No evidence of mitral valve stenosis. Tricuspid Valve: The tricuspid valve is normal in structure. Tricuspid valve regurgitation is severe. No evidence of tricuspid stenosis. Aortic Valve: The aortic valve is normal in structure. Aortic valve regurgitation is not visualized. No aortic stenosis is present. Pulmonic Valve: The pulmonic valve was normal in structure. Pulmonic valve regurgitation is trivial. No evidence of pulmonic stenosis. Aorta: Aortic dilatation noted. There is borderline dilatation of the ascending aorta, measuring 37 mm. Venous: The inferior vena cava is dilated in size with less than 50% respiratory variability, suggesting right  atrial pressure of 15 mmHg. IAS/Shunts: No atrial level shunt detected by color flow Doppler.  LEFT VENTRICLE PLAX 2D LVIDd:         3.50 cm     Diastology LVIDs:         2.90 cm     LV e' medial:    4.20 cm/s LV PW:         1.30 cm     LV E/e' medial:  22.6 LV IVS:        1.20 cm     LV e' lateral:   8.63 cm/s LVOT diam:     2.00 cm     LV E/e' lateral: 11.0 LV SV:         46 LV SV Index:   28 LVOT Area:     3.14 cm  LV Volumes (MOD) LV vol d, MOD A2C: 42.3 ml LV vol d, MOD A4C: 42.2 ml LV vol s, MOD A2C: 15.4 ml LV vol s, MOD A4C: 14.7 ml LV SV MOD A2C:     26.9 ml LV SV MOD A4C:     42.2 ml LV SV MOD BP:      26.1 ml RIGHT VENTRICLE            IVC RV Basal diam:  3.30 cm    IVC diam: 2.10 cm RV S prime:     7.35 cm/s TAPSE (M-mode): 1.2 cm LEFT ATRIUM             Index        RIGHT ATRIUM           Index LA diam:        4.50 cm 2.71 cm/m   RA Area:     25.80 cm  LA Vol (A2C):   61.3 ml 36.97 ml/m  RA Volume:   86.90 ml  52.42 ml/m LA Vol (A4C):   68.7 ml 41.44 ml/m LA Biplane Vol: 73.4 ml 44.27 ml/m  AORTIC VALVE LVOT Vmax:   92.40 cm/s LVOT Vmean:  62.500 cm/s LVOT VTI:    0.148 m  AORTA Ao Root diam: 3.20 cm Ao Asc diam:  3.70 cm MITRAL VALVE               TRICUSPID VALVE MV Area (PHT): 4.12 cm    TR Peak grad:   49.0 mmHg MV Decel Time: 184 msec    TR Mean grad:   35.0 mmHg MR Peak grad: 17.3 mmHg    TR Vmax:        350.00 cm/s MR Vmax:      208.00 cm/s  TR Vmean:       285.0 cm/s MV E velocity: 94.90 cm/s                            SHUNTS                            Systemic VTI:  0.15 m                            Systemic Diam: 2.00 cm Arvilla Meres MD Electronically signed by Arvilla Meres MD Signature Date/Time: 02/16/2023/10:57:17 AM    Final    DG Chest 1 View  Result Date: 02/15/2023 CLINICAL DATA:  Post right-sided thoracentesis EXAM: CHEST  1 VIEW COMPARISON:  Earlier same day; 01/02/2023 FINDINGS: Grossly unchanged enlarged cardiac silhouette and mediastinal contours with atherosclerotic  plaque within a tortuous and potentially ectatic thoracic aorta. Interval reduction in persistent small potentially partially loculated right-sided pleural effusion post thoracentesis. No pneumothorax. Improved aeration of the right lung base with persistent right basilar heterogeneous/consolidative opacities. Unchanged trace left-sided effusion associated left basilar opacities. No evidence of edema. No acute osseous abnormalities. Degenerative change of the right glenohumeral joint is suspected though incompletely evaluated. Stigmata of dish within the thoracic spine. IMPRESSION: 1. Interval reduction in persistent small potentially partially loculated right-sided pleural effusion post thoracentesis. No pneumothorax. 2. Improved aeration of the right lung base with persistent right basilar heterogeneous/consolidative opacities, atelectasis versus infiltrate. 3. Unchanged trace left-sided effusion and associated left basilar opacities, atelectasis versus infiltrate. Electronically Signed   By: Simonne Come M.D.   On: 02/15/2023 15:04   US THORACENTESIS ASP PLEURAL SPACE W/IMG GUIDE  Result Date: 02/15/2023 INDICATION: Patient with history of vascular dementia, endometrial cancer, atrial fibrillation, congestive heart failure, dyspnea, pneumonia, CVA, recurrent right pleural effusion. Request received for diagnostic and therapeutic right thoracentesis. EXAM: ULTRASOUND GUIDED DIAGNOSTIC AND THERAPEUTIC RIGHT THORACENTESIS MEDICATIONS: 8 ml 1% lidocaine COMPLICATIONS: None immediate. PROCEDURE: An ultrasound guided thoracentesis was thoroughly discussed with the patient's son and questions answered. The benefits, risks, alternatives and complications were also discussed. The patient's son understands and wishes to proceed with the procedure. Written consent was obtained. Ultrasound was performed to localize and mark an adequate pocket of fluid in the right chest. The area was then prepped and draped in the normal  sterile fashion. 1% Lidocaine was used for local anesthesia. Under ultrasound guidance a 6 Fr Safe-T-Centesis catheter was introduced. Thoracentesis was performed. The catheter was removed and a dressing applied. FINDINGS: A total of  approximately 1.1 liters of yellow fluid was removed. Samples were sent to the laboratory as requested by the clinical team. Due to persistent patient coughing only the above amount of fluid was removed today. IMPRESSION: Successful ultrasound guided diagnostic and therapeutic right thoracentesis yielding 1.1 liters of pleural fluid. Performed by: Artemio Aly Electronically Signed   By: Simonne Come M.D.   On: 02/15/2023 15:01    Cardiac Studies  TTE 02/16/2023  1. Left ventricular ejection fraction, by estimation, is 60 to 65%. The  left ventricle has normal function. The left ventricle has no regional  wall motion abnormalities. There is mild concentric left ventricular  hypertrophy. Left ventricular diastolic  function could not be evaluated.   2. Right ventricular systolic function is mildly reduced. The right  ventricular size is mildly enlarged. There is severely elevated pulmonary  artery systolic pressure. The estimated right ventricular systolic  pressure is 64.0 mmHg.   3. Left atrial size was severely dilated.   4. Right atrial size was severely dilated.   5. A small pericardial effusion is present. The pericardial effusion is  localized near the right atrium. There is no evidence of cardiac  tamponade.   6. The mitral valve is normal in structure. Mild mitral valve  regurgitation. No evidence of mitral stenosis.   7. Tricuspid valve regurgitation is severe.   8. The aortic valve is normal in structure. Aortic valve regurgitation is  not visualized. No aortic stenosis is present.   9. Aortic dilatation noted. There is borderline dilatation of the  ascending aorta, measuring 37 mm.  10. The inferior vena cava is dilated in size with <50% respiratory   variability, suggesting right atrial pressure of 15 mmHg.  11. Findings suggestive of restrictive cardiomyopathy.   Patient Profile  Tiffany Black is a 89 y.o. female with permanent atrial fibrillation (off anticoagulation), CVA, hypertension, CKD stage III, dementia admitted on 02/15/2023 for acute hypoxic respiratory failure secondary to recurrent right-sided pleural effusion and diastolic heart failure.   Assessment & Plan   # Acute hypoxic respiratory failure # Recurrent right pleural effusion # Acute diastolic heart failure, EF 60-65% # Moderate-severe pulmonary hypertension -Admitted with worsening shortness of breath.  Has known right pleural effusion that is required multiple thoracenteses.  Status post 1.1 L removal from the right lung. -Echo shows elevated pulmonary pressures and dilated IVC.  She has been given IV bumetanide.  Reported allergy to furosemide but tolerating bumetanide fine. -She is breathing comfortably.  Breath sounds are much improved.  She is removing IVs and very agitated due to her underlying dementia.  I think it is okay for her to transition to Bumex 1 mg daily.  She can continue this at discharge. -Also noted to have severe tricuspid regurgitation which is related to worsening right heart failure.  She has no options other than medical management. -Will continue Aldactone as this will help with potassium as well.  Due to advanced age unclear benefit of SGLT2 inhibitor.  Ongoing goals of care discussion. -Attempted to update her son Charnice Zwilling by phone.  His phone goes straight to voicemail.  # Permanent A-fib -Rate controlled on metoprolol.  Xarelto has been held due to recurrent thoracenteses.  Given advanced dementia may not be an ideal candidate for anticoagulation.  For now okay to hold.  Unclear if she may need recurrent thoracenteses or other procedures.  # Hypertension -Continue home irbesartan 150 mg daily, Toprol tartrate 50 mg twice daily,  amlodipine 10 mg daily.  Continue Aldactone 25 mg daily.  # Dementia  # Frailty -Ongoing goals of care discussion  [facility] will sign off.   Medication Recommendations: As above Other recommendations (labs, testing, etc): None Follow up as an outpatient: We will arrange outpatient follow-up in 2 to 4 weeks  For questions or updates, please contact [facility] Please consult [email or web address] for contact info under        Signed, Gerri Spore T. Flora Lipps, MD, [facility] [place]  [facility]  02/17/2023 8:05 AM

## 2023-02-17 NOTE — Progress Notes (Signed)
Daily Progress Note   Patient Name: Tiffany Black       Date: 02/17/2023 DOB: 08/21/1934  Age: 89 y.o. MRN#: [id number] Attending Physician: Cathren Harsh, MD Primary Care Physician: Cleatis Polka., MD Admit Date: 02/15/2023 Length of Stay: 2 days  Reason for Consultation/Follow-up: Establishing goals of care  Subjective:   CC: Patient sleeping comfortably when seen after receiving medications for agitation.  Subjective:  Review EMR prior to presenting to bedside. Sitter now present at bedside due to patient's agitation.  Patient already seen by cardiology this AM. Volume status appears improved and transitioning over to oral medications for volume management. Cardiologist also attempted to reach patient's son, Tiffany Black, to discuss without answer. Phone going straight to voicemail.   Discussed care with bedside RN for updates. Patient recently received quetiapine 25mg  and Ativan 1mg  for agitation this morning. Because of this when patient seen, she is sleeping comfortably.  RN noted patient's son did come by yesterday later in evening though only for a few minutes.   Attempted to reach patient's son, Tiffany Black, again today with answer. Phone going straight to voicemail.    Review of Systems Patient sleeping comfortably Objective:   Vital Signs:  BP (!) 165/98 (BP Location: Right Arm)   Pulse 90   Temp 98.1 F (36.7 C) (Oral)   Resp 17   Ht 5\' 5"  (1.651 m)   Wt 59.9 kg   SpO2 91%   BMI 21.97 kg/m   Physical Exam: General: NAD, sleeping in bed, chronically ill-appearing Eyes: No drainage noted Cardiovascular: RRR Respiratory: no increased work of breathing noted, not in respiratory distress Abdomen: not distended Neuro: sleeping after noted agitation this AM  Imaging:  I personally reviewed recent imaging.   Assessment & Plan:   Assessment: Patient is an 89 year old female with a past medical history of vascular dementia, HFpEF, atrial fibrillation, CVA, IBS,  multiple recent ED visits, and recurrent pleural effusions who was admitted on 02/15/2023 for management of shortness of breath.  Patient was brought in from [place] where she is in [facility].  Since admission, patient has undergone thoracentesis for recurrent transudative, lymphocytic pleural effusion; pulmonary following.  Cardiology also consulted and assisting with fluid management by adjustment of medications.  Palliative medicine team consulted to assist with complex medical decision making. Of note patient was last seen by palliative medicine team in January of this year. At that time, PMT had provided son with MOST form for review and outpatient palliative medicine referral.    Recommendations/Plan: # Complex medical decision making/goals of care:    -Patient unable to participate in complex medical decision making due to underlying dementia which likely currently worsened by delirium.  Patient came from memory unit with underlying diagnosis of vascular dementia and likely will return there.                - Attempted to call patient's son, Tiffany Black, as per contact information listed in EMR though unable to reach by phone today.  Will attempt to follow-up as able.                -  Code Status: Full Code    # Symptom management                -As per primary team   # Psycho-social/Spiritual Support:  - Support System: son- Tiffany Black   # Discharge Planning:  Likely returning to memory care unit                -  PMT had previously discussed outpatient palliative medicine referral with patient's son. Again unable to reach son today to discuss. Think it would be appropraite referral for home palliative medicine at memory care unit if patient's son agreeing to it.   Discussed with: bedside RN, patient's sitter   Thank you for allowing the palliative care team to participate in the care [name].  Alvester Morin, DO Palliative Care Provider PMT # [phone]  If  patient remains symptomatic despite maximum doses, please call PMT at [phone] between 0700 and 1900. Outside of these hours, please call attending, as PMT does not have night coverage.  *Please note that this is a verbal dictation therefore any spelling or grammatical errors are due to the "[employer] One" system interpretation.

## 2023-02-18 LAB — CULTURE, BLOOD (ROUTINE X 2)

## 2023-02-18 LAB — CULTURE, BODY FLUID W GRAM STAIN -BOTTLE

## 2023-02-19 LAB — LEGIONELLA PNEUMOPHILA SEROGP 1 UR AG: L. pneumophila Serogp 1 Ur Ag: NEGATIVE

## 2023-02-19 LAB — CULTURE, BLOOD (ROUTINE X 2): Culture: NO GROWTH

## 2023-02-20 LAB — CULTURE, BLOOD (ROUTINE X 2): Culture: NO GROWTH

## 2023-02-21 LAB — MISC LABCORP TEST (SEND OUT): Labcorp test code: 9985

## 2023-02-27 ENCOUNTER — Emergency Department (HOSPITAL_BASED_OUTPATIENT_CLINIC_OR_DEPARTMENT_OTHER)
Admission: EM | Admit: 2023-02-27 | Discharge: 2023-02-27 | Disposition: A | Discharge status: Home / Self Care | Payer: Medicare Other | Attending: Emergency Medicine | Admitting: Emergency Medicine

## 2023-02-27 ENCOUNTER — Emergency Department (HOSPITAL_BASED_OUTPATIENT_CLINIC_OR_DEPARTMENT_OTHER): Payer: Medicare Other

## 2023-02-27 ENCOUNTER — Other Ambulatory Visit: Payer: Self-pay

## 2023-02-27 DIAGNOSIS — S60944A Unspecified superficial injury of right ring finger, initial encounter: Secondary | ICD-10-CM | POA: Diagnosis present

## 2023-02-27 DIAGNOSIS — W010XXA Fall on same level from slipping, tripping and stumbling without subsequent striking against object, initial encounter: Secondary | ICD-10-CM | POA: Diagnosis not present

## 2023-02-27 DIAGNOSIS — F0390 Unspecified dementia without behavioral disturbance: Secondary | ICD-10-CM | POA: Insufficient documentation

## 2023-02-27 DIAGNOSIS — W19XXXA Unspecified fall, initial encounter: Secondary | ICD-10-CM

## 2023-02-27 DIAGNOSIS — S61214A Laceration without foreign body of right ring finger without damage to nail, initial encounter: Secondary | ICD-10-CM

## 2023-02-27 DIAGNOSIS — Z91040 Latex allergy status: Secondary | ICD-10-CM | POA: Diagnosis not present

## 2023-02-27 DIAGNOSIS — Z79899 Other long term (current) drug therapy: Secondary | ICD-10-CM | POA: Insufficient documentation

## 2023-02-27 NOTE — ED Triage Notes (Signed)
BIB [facility] from [facility]. Found by staff walking out of room with finger skin tear- R Ring finger. Bandage applied by EMS. No obvious injuries noted. Pt Alert and oriented to person only-baseline.

## 2023-02-27 NOTE — Discharge Instructions (Addendum)
You refused your x-ray of your pelvis, however you did walk in the emergency department with a steady gait.  You had 2 Steri-Strips placed to your right ring finger, this should fall off in the next 7 to 10 days.

## 2023-02-27 NOTE — ED Notes (Signed)
Pt ambulated with minimal assistance from the room doorway to her bed.

## 2023-02-27 NOTE — ED Notes (Signed)
Pt dc with [facility]. Discharge information and report given to [facility] and [facility]. Pt confused, hx of dementia. Pt at baseline. No issues at this time. Jillyn Hidden, RN

## 2023-02-27 NOTE — ED Provider Notes (Signed)
[place] EMERGENCY DEPARTMENT AT [facility] Provider Note   CSN: [id number] Arrival date & time: 02/27/23  1246     History Dementia  Chief Complaint  Patient presents with   Finger Injury    Tiffany Black is a 89 y.o. female.  89 year old with a prior history of dementia presents to the ED via EMS with a chief complaint of fall.  According to EMS report, patient was found down on the ground, reports this fall was witnessed, she does have a small skin tear to her right ring finger, bandage was applied.  No signs of head trauma.  Patient endorses no pain along her entire body aside from slight laceration to the right tip of the finger.  Last tetanus immunization was in 2000 and.  No other complaints reported.  The history is provided by the patient.       Home Medications Prior to Admission medications   Medication Sig Start Date End Date Taking? Authorizing Provider  acetaminophen (TYLENOL) 325 MG tablet Take 650 mg by mouth every 6 (six) hours as needed for moderate pain.    [provider]  amLODipine (NORVASC) 10 MG tablet Take 1 tablet (10 mg total) by mouth daily. 02/18/23   Rai, Ripudeep K, MD  bumetanide (BUMEX) 1 MG tablet Take 1 tablet (1 mg total) by mouth daily. 02/18/23   Rai, Delene Ruffini, MD  FLUoxetine (PROZAC) 10 MG capsule Take 10 mg by mouth daily.    [provider]  LORazepam (ATIVAN) 1 MG tablet Take 1 tablet (1 mg total) by mouth every 8 (eight) hours as needed for anxiety (agitation). 02/17/23   Rai, Delene Ruffini, MD  metoprolol tartrate (LOPRESSOR) 50 MG tablet Take 50 mg by mouth 2 (two) times daily. 01/15/23   [provider]  nystatin (MYCOSTATIN/NYSTOP) powder Apply 1 Application topically 2 (two) times daily. 01/11/23   [provider]  potassium chloride SA (KLOR-CON M) 20 MEQ tablet Take 0.5 tablets (10 mEq total) by mouth daily. 02/17/23   Rai, Delene Ruffini, MD  QUEtiapine (SEROQUEL) 50 MG tablet Take 1 tablet (50  mg total) by mouth at bedtime. 08/15/22   Elgergawy, Leana Roe, MD  Rivaroxaban (XARELTO) 15 MG TABS tablet Take 1 tablet (15 mg total) by mouth daily with supper. Currently on hold 02/17/23   Rai, Ripudeep K, MD  senna-docusate (SENOKOT-S) 8.6-50 MG tablet Take 1 tablet by mouth at bedtime as needed for mild constipation. Patient not taking: Reported on 02/15/2023 10/02/22   Almon Hercules, MD  spironolactone (ALDACTONE) 25 MG tablet Take 1 tablet (25 mg total) by mouth daily. 02/18/23   Rai, Ripudeep K, MD  valsartan (DIOVAN) 160 MG tablet Take 80 mg by mouth 2 (two) times daily.    [provider]      Allergies    Hydrochlorothiazide, Penicillins, Ciprofloxacin, Doxycycline, Hydrocodone-acetaminophen, Iodinated contrast media, Iodine-131, Lasix [furosemide], Latex, Niaspan [niacin], Statins, Carbamazepine, Codeine, Nickel, Sulfa antibiotics, Sulfamethoxazole, and Sulfonamide derivatives    Review of Systems   Review of Systems  Constitutional:  Negative for fever.  Musculoskeletal:  Positive for myalgias.  Skin:  Positive for wound.    Physical Exam Updated Vital Signs BP 130/81   Pulse 81   Temp 98 F (36.7 C) (Oral)   Resp 18   SpO2 100%  Physical Exam Vitals and nursing note reviewed.  Constitutional:      Appearance: Normal appearance.  HENT:     Head: Normocephalic and atraumatic.  Comments: No palpable goose eggs, no signs of hematoma.     Mouth/Throat:     Mouth: Mucous membranes are moist.  Eyes:     Pupils: Pupils are equal, round, and reactive to light.  Cardiovascular:     Rate and Rhythm: Normal rate.  Pulmonary:     Effort: Pulmonary effort is normal.     Breath sounds: No wheezing.  Abdominal:     General: Abdomen is flat.  Musculoskeletal:     Cervical back: Normal range of motion and neck supple.  Skin:    General: Skin is warm and dry.  Neurological:     Mental Status: Mental status is at baseline.     Comments: AOX2.      ED Results /  Procedures / Treatments   Labs (all labs ordered are listed, but only abnormal results are displayed) Labs Reviewed - No data to display  EKG None  Radiology No results found.  Procedures .Marland KitchenLaceration Repair  Date/Time: 02/27/2023 2:43 PM  Performed by: Claude Manges, PA-C Authorized by: Claude Manges, PA-C   Consent:    Consent obtained:  Verbal   Consent given by:  Patient   Risks discussed:  Infection   Alternatives discussed:  No treatment Universal protocol:    Patient identity confirmed:  Verbally with patient Anesthesia:    Anesthesia method:  None Laceration details:    Location:  Finger   Finger location:  R ring finger   Length (cm):  0.5 Treatment:    Area cleansed with:  Saline   Amount of cleaning:  Extensive Skin repair:    Repair method:  Steri-Strips   Number of Steri-Strips:  2 Approximation:    Approximation:  Close Repair type:    Repair type:  Simple Post-procedure details:    Dressing:  Bulky dressing   Procedure completion:  Tolerated well, no immediate complications     Medications Ordered in ED Medications - No data to display  ED Course/ Medical Decision Making/ A&P                            Medical Decision Making Amount and/or Complexity of Data Reviewed Radiology: ordered.   Patient here with a chief complaint of mechanical fall, dementia at baseline.  Brought in by EMS.  Witnessed fall with no LOC, no head injury, no blood thinners.  Has a small skin tear to the right ring finger, this was repaired by me with Steri-Strips and she refused to Dermabond.  Examination is normal.  No signs of trauma to her head, no hematoma, no obvious deformities.  No pain along the cervical spine.  Chest and abdomen without any signs of bruising or hematoma.  Has good range of motion of bilateral lower extremities, order pelvic x-ray in order to rule out any hip fracture, however patient refused.  She did ambulate with no assistance to the bathroom on  her own.  Her vitals are within normal limits.  We discussed Steri-Strips likely will fall off in the next few days.  Patient is hemodynamically stable, and will go home via [facility] at this time.     Portions of this note were generated with Scientist, clinical (histocompatibility and immunogenetics). Dictation errors may occur despite best attempts at proofreading.   Final Clinical Impression(s) / ED Diagnoses Final diagnoses:  Fall, initial encounter  Laceration of right ring finger without foreign body without damage to nail, initial encounter    Rx /  DC Orders ED Discharge Orders     None         Claude Manges, Cordelia Poche 02/27/23 1446    Ernie Avena, MD 02/27/23 Margretta Ditty

## 2023-03-05 ENCOUNTER — Other Ambulatory Visit: Payer: Self-pay | Admitting: Nurse Practitioner

## 2023-03-05 DIAGNOSIS — J189 Pneumonia, unspecified organism: Secondary | ICD-10-CM

## 2023-03-06 ENCOUNTER — Inpatient Hospital Stay: Payer: Medicare Other | Admitting: Nurse Practitioner

## 2023-03-07 ENCOUNTER — Ambulatory Visit (INDEPENDENT_AMBULATORY_CARE_PROVIDER_SITE_OTHER): Payer: Medicare Other | Admitting: Student

## 2023-03-07 ENCOUNTER — Encounter: Payer: Self-pay | Admitting: Student

## 2023-03-07 ENCOUNTER — Ambulatory Visit (INDEPENDENT_AMBULATORY_CARE_PROVIDER_SITE_OTHER): Payer: Medicare Other

## 2023-03-07 VITALS — BP 112/76 | HR 86 | Temp 97.8°F | Ht 65.0 in | Wt 132.0 lb

## 2023-03-07 DIAGNOSIS — J90 Pleural effusion, not elsewhere classified: Secondary | ICD-10-CM

## 2023-03-07 NOTE — Progress Notes (Signed)
Synopsis: Referred for recurrent R pleural effusion by Cleatis Polka., MD  Subjective:   PATIENT ID: Tiffany Black GENDER: female DOB: Jan 16, 1934, MRN: [id number]  Chief Complaint  Patient presents with   Hospitalization Follow-up    Breathing is overall doing well. She has occ dry cough.    89yF with chronic diastolic heart failure, pAF, HTN, advanced dementia   Recent admission with CAP and recurrent R pleural effusion sp 1.1L R thoracentesis. She has no trouble breathing. Has overall done well from respiratory standpoint since hospitalization. She has no chest pain. She doesn't report orthopnea.   She was discharged on 1mg  bumex daily. Palliative was consulted during admission in setting of her advanced dementia, recurrent admissions however they were not able to establish contact with pt's son prior to discharge.   Otherwise pertinent review of systems is negative.  Past Medical History:  Diagnosis Date   Anxiety    Arthritis    BPPV (benign paroxysmal positional vertigo)    Breast lesion    left   Bulging of cervical intervertebral disc    Cardiomegaly    Cataracts, bilateral    Chronic neck pain    CVA (cerebral vascular accident) (HCC)    x2 2013 and 2015; right temporal lobe and left frontal lobe infarct-> dzziness which resolved; occluded right PCA, moderate posterior left cerebral stenosis, left MCA stenosis on MRA   Dementia [facility])    Essential hypertension    History of endometrial cancer    History of stroke    2015 and 2016 // carotid US 8/16: Bilateral 1-39 // carotid US 2/16: Bilateral ICA 1-39   HLD (hyperlipidemia)    Hx of echocardiogram    a. Echo (11/15): Mild LVH, EF 55-60%, normal wall motion, grade 2 diastolic dysfunction, mild LAE, normal RV function, PASP 29 mm Hg // echo 8/16:Mild concentric LVH, EF 55-60, normal wall motion, grade 2 diastolic dysfunction, mild LAE, PASP 39   Hypertension    IBS (irritable bowel syndrome)    Insomnia     Lung nodule    OA (osteoarthritis)    Pectus excavatum    Persistent atrial fibrillation (HCC)    Pleural effusion    right recurrent, 1/24 s/p thoracentesis x 3 (cytology negative)- Dr Francine Graven   Pneumonia due to COVID-19 virus    Radius fracture    left distal   Vertigo    Vitamin D deficiency      Family History  Problem Relation Age of Onset   Stroke Mother    Heart attack Father    Heart disease Father    Dementia Sister    CAD Brother    Stroke Maternal Uncle    Hypertension Neg Hx      Past Surgical History:  Procedure Laterality Date   ABDOMINAL HYSTERECTOMY  2011   CATARACT EXTRACTION, BILATERAL Bilateral 2009   CESAREAN SECTION  1975   IR THORACENTESIS ASP PLEURAL SPACE W/IMG GUIDE  09/28/2022   IR THORACENTESIS ASP PLEURAL SPACE W/IMG GUIDE  10/23/2022   LAPAROSCOPIC HYSTERECTOMY  2011   THORACENTESIS Right 11/03/2022   Procedure: THORACENTESIS;  Surgeon: Omar Person, MD;  Location: [facility] ENDOSCOPY;  Service: Pulmonary;  Laterality: Right;    Social History   Socioeconomic History   Marital status: Widowed    Spouse name: Not on file   Number of children: 3   Years of education: 55   Highest education level: Not on file  Occupational History  Occupation: RetiredTherapist, nutritional  Tobacco Use   Smoking status: Never   Smokeless tobacco: Never  Vaping Use   Vaping Use: Never used  Substance and Sexual Activity   Alcohol use: No   Drug use: No   Sexual activity: Never  Other Topics Concern   Not on file  Social History Narrative   Diet: N/A   Caffeine: N/A   Married, if yes what year: Widow, married 1956   Do you live in a house, apartment, assisted living, condo, trailer, ect: House, one stories, 2 persons   Pets: None   Highest level of education: 12th Grade    Current/Past profession: Diplomatic Services operational officer    Exercise: Yes, walking everyday    Right handed   Living Will:    DNR:   POA/HPOA:   Functional Status:   Do you have difficulty bathing or  dressing yourself? No   Do you have difficulty preparing food or eating? No    Do you have difficulty managing your medications? No   Do you have difficulty managing your finances? No    Do you have difficulty affording your medications? Yes   Social Determinants of Health   Financial Resource Strain: Not on file  Food Insecurity: No Food Insecurity (02/17/2023)   Hunger Vital Sign    Worried About Running Out of Food in the Last Year: Never true    Ran Out of Food in the Last Year: Never true  Transportation Needs: No Transportation Needs (02/17/2023)   PRAPARE - Administrator, Civil Service (Medical): No    Lack of Transportation (Non-Medical): No  Physical Activity: Not on file  Stress: Not on file  Social Connections: Not on file  Intimate Partner Violence: Not At Risk (02/17/2023)   Humiliation, Afraid, Rape, and Kick questionnaire    Fear of Current or Ex-Partner: No    Emotionally Abused: No    Physically Abused: No    Sexually Abused: No     Allergies  Allergen Reactions   Hydrochlorothiazide Hives   Penicillins Hives    Has patient had a PCN reaction causing immediate rash, facial/tongue/throat swelling, SOB or lightheadedness with hypotension: Yes Has patient had a PCN reaction causing severe rash involving mucus membranes or skin necrosis: No Has patient had a PCN reaction that required hospitalization No Has patient had a PCN reaction occurring within the last 10 years: No If all of the above answers are "NO", then may proceed with Cephalosporin use.    Ciprofloxacin Other (See Comments) and Hypertension    Blood pressure issues- patient doesn't recall if it increased OR decreased it     Doxycycline Diarrhea and Nausea And Vomiting   Hydrocodone-Acetaminophen Other (See Comments)    Unknown reaction   Iodinated Contrast Media Diarrhea, Other (See Comments) and Hypertension    Elevated the B/P   Iodine-131 Other (See Comments) and Hypertension     Raised the blood pressure   Lasix [Furosemide] Other (See Comments)    No reaction listed on order summary report from [facility]   Latex Itching   Niaspan [Niacin]     Flu (from [facility] notes)   Statins Other (See Comments)    Muscle weakness   Carbamazepine Other (See Comments) and Rash    Flushed blood pressure medication out of system   Codeine Nausea And Vomiting   Nickel Rash   Sulfa Antibiotics Hives and Rash   Sulfamethoxazole Hives and Rash   Sulfonamide Derivatives Hives and Rash  Outpatient Medications Prior to Visit  Medication Sig Dispense Refill   acetaminophen (TYLENOL) 325 MG tablet Take 650 mg by mouth every 6 (six) hours as needed for moderate pain.     amLODipine (NORVASC) 10 MG tablet Take 1 tablet (10 mg total) by mouth daily.     bumetanide (BUMEX) 1 MG tablet Take 1 tablet (1 mg total) by mouth daily.     FLUoxetine (PROZAC) 10 MG capsule Take 10 mg by mouth daily.     LORazepam (ATIVAN) 1 MG tablet Take 1 tablet (1 mg total) by mouth every 8 (eight) hours as needed for anxiety (agitation). 20 tablet 0   metoprolol tartrate (LOPRESSOR) 50 MG tablet Take 50 mg by mouth 2 (two) times daily.     nystatin (MYCOSTATIN/NYSTOP) powder Apply 1 Application topically 2 (two) times daily.     potassium chloride SA (KLOR-CON M) 20 MEQ tablet Take 0.5 tablets (10 mEq total) by mouth daily.     QUEtiapine (SEROQUEL) 50 MG tablet Take 1 tablet (50 mg total) by mouth at bedtime.     Rivaroxaban (XARELTO) 15 MG TABS tablet Take 1 tablet (15 mg total) by mouth daily with supper. Currently on hold 30 tablet 5   senna-docusate (SENOKOT-S) 8.6-50 MG tablet Take 1 tablet by mouth at bedtime as needed for mild constipation.     spironolactone (ALDACTONE) 25 MG tablet Take 1 tablet (25 mg total) by mouth daily.     valsartan (DIOVAN) 160 MG tablet Take 80 mg by mouth 2 (two) times daily.     No facility-administered medications prior to visit.       Objective:   Physical  Exam:  General appearance: 89 y.o., female, NAD, conversant  Eyes: anicteric sclerae; PERRL, tracking appropriately HENT: NCAT; MMM Neck: Trachea midline; no lymphadenopathy, no JVD Lungs: diminished R base, with normal respiratory effort CV: IRIR, no murmur  Abdomen: Soft, non-tender; non-distended, BS present  Extremities: No peripheral edema, warm Skin: Normal turgor and texture; no rash Psych: Appropriate affect Neuro: Alert and oriented to person and place, no focal deficit     Vitals:   03/07/23 0941  BP: 112/76  Pulse: 86  Temp: 97.8 F (36.6 C)  TempSrc: Oral  SpO2: 94%  Weight: 132 lb (59.9 kg)  Height: 5\' 5"  (1.651 m)   94% on RA BMI Readings from Last 3 Encounters:  03/07/23 21.97 kg/m  02/15/23 21.97 kg/m  01/14/23 22.01 kg/m   Wt Readings from Last 3 Encounters:  03/07/23 132 lb (59.9 kg)  02/15/23 132 lb (59.9 kg)  01/14/23 132 lb 4.4 oz (60 kg)     CBC    Component Value Date/Time   WBC 6.8 02/16/2023 0552   RBC 3.46 (L) 02/16/2023 0552   HGB 10.4 (L) 02/16/2023 0552   HGB 12.5 06/13/2022 1145   HCT 32.4 (L) 02/16/2023 0552   HCT 38.0 06/13/2022 1145   PLT 181 02/16/2023 0552   PLT 169 06/13/2022 1145   MCV 93.6 02/16/2023 0552   MCV 92 06/13/2022 1145   MCH 30.1 02/16/2023 0552   MCHC 32.1 02/16/2023 0552   RDW 14.8 02/16/2023 0552   RDW 13.1 06/13/2022 1145   LYMPHSABS 1.3 02/15/2023 0339   LYMPHSABS 1.7 11/28/2017 1135   MONOABS 0.5 02/15/2023 0339   EOSABS 0.2 02/15/2023 0339   EOSABS 0.2 11/28/2017 1135   BASOSABS 0.0 02/15/2023 0339   BASOSABS 0.0 11/28/2017 1135      Chest Imaging: CXR 02/15/23 with decreased size  of R pleural effusion  CXR 03/07/23 reviewed by me with some reaccumulation of R pleural effusion today however appears far less congested on CXR overall with decreased interstitial markings and sharper L diaphragmatic border  Pulmonary Functions Testing Results:     No data to display           Echocardiogram:    1. Left ventricular ejection fraction, by estimation, is 60 to 65%. The  left ventricle has normal function. The left ventricle has no regional  wall motion abnormalities. There is mild concentric left ventricular  hypertrophy. Left ventricular diastolic  function could not be evaluated.   2. Right ventricular systolic function is mildly reduced. The right  ventricular size is mildly enlarged. There is severely elevated pulmonary  artery systolic pressure. The estimated right ventricular systolic  pressure is 64.0 mmHg.   3. Left atrial size was severely dilated.   4. Right atrial size was severely dilated.   5. A small pericardial effusion is present. The pericardial effusion is  localized near the right atrium. There is no evidence of cardiac  tamponade.   6. The mitral valve is normal in structure. Mild mitral valve  regurgitation. No evidence of mitral stenosis.   7. Tricuspid valve regurgitation is severe.   8. The aortic valve is normal in structure. Aortic valve regurgitation is  not visualized. No aortic stenosis is present.   9. Aortic dilatation noted. There is borderline dilatation of the  ascending aorta, measuring 37 mm.  10. The inferior vena cava is dilated in size with <50% respiratory  variability, suggesting right atrial pressure of 15 mmHg.  11. Findings suggestive of restrictive cardiomyopathy.      Assessment & Plan:   # Recurrent transudative pleural effusion # Recurrent admissions, advanced dementia  # Elevated RVSP # Chronic diastolic heart failure   Some reaccumulation of R pleural effusion today however appears far less congested on CXR overall with decreased interstitial markings and sharper L diaphragmatic border. Doing ok from respiratory standpoint today.   - bumex 1 mg daily per cardiology - overnight oximetry - can arrange repeat thoracentesis if needed    RTC 3 months with Dr. Francine Graven per son's request   Omar Person, MD [facility] 03/07/2023 12:02 PM

## 2023-03-07 NOTE — Patient Instructions (Addendum)
-   Please call or send my chart message if she is noted to have more trouble breathing especially when she is lying down  - we will check her oxygen overnight - see you in 3 months or sooner if need be!

## 2023-04-08 ENCOUNTER — Emergency Department (HOSPITAL_COMMUNITY)
Admission: EM | Admit: 2023-04-08 | Discharge: 2023-04-09 | Disposition: A | Discharge status: Home / Self Care | Payer: Medicare Other | Attending: Emergency Medicine | Admitting: Emergency Medicine

## 2023-04-08 ENCOUNTER — Emergency Department (HOSPITAL_COMMUNITY): Payer: Medicare Other

## 2023-04-08 DIAGNOSIS — S52045A Nondisplaced fracture of coronoid process of left ulna, initial encounter for closed fracture: Secondary | ICD-10-CM | POA: Insufficient documentation

## 2023-04-08 DIAGNOSIS — M25522 Pain in left elbow: Secondary | ICD-10-CM | POA: Diagnosis present

## 2023-04-08 DIAGNOSIS — I6529 Occlusion and stenosis of unspecified carotid artery: Secondary | ICD-10-CM | POA: Insufficient documentation

## 2023-04-08 DIAGNOSIS — W51XXXA Accidental striking against or bumped into by another person, initial encounter: Secondary | ICD-10-CM | POA: Insufficient documentation

## 2023-04-08 DIAGNOSIS — M4802 Spinal stenosis, cervical region: Secondary | ICD-10-CM | POA: Insufficient documentation

## 2023-04-08 DIAGNOSIS — I509 Heart failure, unspecified: Secondary | ICD-10-CM | POA: Diagnosis not present

## 2023-04-08 DIAGNOSIS — Z91040 Latex allergy status: Secondary | ICD-10-CM | POA: Diagnosis not present

## 2023-04-08 DIAGNOSIS — I4891 Unspecified atrial fibrillation: Secondary | ICD-10-CM | POA: Insufficient documentation

## 2023-04-08 DIAGNOSIS — I6782 Cerebral ischemia: Secondary | ICD-10-CM | POA: Insufficient documentation

## 2023-04-08 DIAGNOSIS — Z7901 Long term (current) use of anticoagulants: Secondary | ICD-10-CM | POA: Diagnosis not present

## 2023-04-08 MED ORDER — ACETAMINOPHEN 500 MG PO TABS
1000.0000 mg | ORAL_TABLET | Freq: Once | ORAL | Status: AC
  Administered 2023-04-08: 1000 mg via ORAL
  Filled 2023-04-08: qty 2

## 2023-04-08 NOTE — ED Notes (Signed)
Call received from pt son Jalexia Preiser [phone] requesting rtn call for pt status/updates. [employer]

## 2023-04-08 NOTE — ED Provider Notes (Signed)
[place] EMERGENCY DEPARTMENT AT [facility] Provider Note   CSN: [id number] Arrival date & time: 04/08/23  2240     History {Add pertinent medical, surgical, social history, OB history to HPI:1} Chief Complaint  Patient presents with   Mel Almond Anis Levenstein is a 89 y.o. female.  The history is provided by the patient and medical records.  Fall   89 year old female with history of CHF, anxiety, A-fib on Xarelto, presenting to the ED after a fall.  Patient states she bumped into her son and fell on her left arm.  There was no head injury or loss of consciousness.  She has pain about the left elbow and forearm.  She did sustain a small skin tear.  She is right-hand dominant.  Home Medications Prior to Admission medications   Medication Sig Start Date End Date Taking? Authorizing Provider  acetaminophen (TYLENOL) 325 MG tablet Take 650 mg by mouth every 6 (six) hours as needed for moderate pain.    [provider]  amLODipine (NORVASC) 10 MG tablet Take 1 tablet (10 mg total) by mouth daily. 02/18/23   Rai, Ripudeep K, MD  bumetanide (BUMEX) 1 MG tablet Take 1 tablet (1 mg total) by mouth daily. 02/18/23   Rai, Delene Ruffini, MD  FLUoxetine (PROZAC) 10 MG capsule Take 10 mg by mouth daily.    [provider]  LORazepam (ATIVAN) 1 MG tablet Take 1 tablet (1 mg total) by mouth every 8 (eight) hours as needed for anxiety (agitation). 02/17/23   Rai, Delene Ruffini, MD  metoprolol tartrate (LOPRESSOR) 50 MG tablet Take 50 mg by mouth 2 (two) times daily. 01/15/23   [provider]  nystatin (MYCOSTATIN/NYSTOP) powder Apply 1 Application topically 2 (two) times daily. 01/11/23   [provider]  potassium chloride SA (KLOR-CON M) 20 MEQ tablet Take 0.5 tablets (10 mEq total) by mouth daily. 02/17/23   Rai, Delene Ruffini, MD  QUEtiapine (SEROQUEL) 50 MG tablet Take 1 tablet (50 mg total) by mouth at bedtime. 08/15/22   Elgergawy, Leana Roe, MD  Rivaroxaban  (XARELTO) 15 MG TABS tablet Take 1 tablet (15 mg total) by mouth daily with supper. Currently on hold 02/17/23   Rai, Ripudeep K, MD  senna-docusate (SENOKOT-S) 8.6-50 MG tablet Take 1 tablet by mouth at bedtime as needed for mild constipation. 10/02/22   Almon Hercules, MD  spironolactone (ALDACTONE) 25 MG tablet Take 1 tablet (25 mg total) by mouth daily. 02/18/23   Rai, Ripudeep K, MD  valsartan (DIOVAN) 160 MG tablet Take 80 mg by mouth 2 (two) times daily.    [provider]      Allergies    Hydrochlorothiazide, Penicillins, Ciprofloxacin, Doxycycline, Hydrocodone-acetaminophen, Iodinated contrast media, Iodine-131, Lasix [furosemide], Latex, Niaspan [niacin], Statins, Carbamazepine, Codeine, Nickel, Sulfa antibiotics, Sulfamethoxazole, and Sulfonamide derivatives    Review of Systems   Review of Systems  Musculoskeletal:  Positive for arthralgias.  All other systems reviewed and are negative.   Physical Exam Updated Vital Signs BP (!) 154/106 (BP Location: Right Arm)   Pulse (!) 107   Temp 98 F (36.7 C) (Oral)   Resp 18   SpO2 91%  Bandage. Physical Exam Vitals and nursing note reviewed.  Constitutional:      Appearance: She is well-developed.  HENT:     Head: Normocephalic and atraumatic.  Eyes:     Conjunctiva/sclera: Conjunctivae normal.     Pupils: Pupils are equal, round, and reactive to  light.  Cardiovascular:     Rate and Rhythm: Normal rate and regular rhythm.     Heart sounds: Normal heart sounds.  Pulmonary:     Effort: Pulmonary effort is normal.     Breath sounds: Normal breath sounds.  Abdominal:     General: Bowel sounds are normal.     Palpations: Abdomen is soft.  Musculoskeletal:        General: Normal range of motion.     Cervical back: Normal range of motion.     Comments: Bandage around left proximal forearm, pain elicited along the posterior elbow and proximal forearm, no acute deformity, radial pulse intact  Skin:    General: Skin is  warm and dry.  Neurological:     Mental Status: She is alert and oriented to person, place, and time.     ED Results / Procedures / Treatments   Labs (all labs ordered are listed, but only abnormal results are displayed) Labs Reviewed - No data to display  EKG None  Radiology DG Forearm Left  Result Date: 04/08/2023 CLINICAL DATA:  Fall injury EXAM: LEFT FOREARM - 2 VIEW COMPARISON:  04/29/2019 FINDINGS: Old fracture deformity of the distal radius. Suspected fracture deformity involving the coronoid process of the ulna on lateral view. IMPRESSION: 1. Suspected fracture deformity involving the coronoid process of the ulna. Recommend dedicated views of the elbow. 2. Old fracture deformity of the distal radius. Electronically Signed   By: Jasmine Pang M.D.   On: 04/08/2023 23:14    Procedures Procedures  {Document cardiac monitor, telemetry assessment procedure when appropriate:1}  Medications Ordered in ED Medications - No data to display  ED Course/ Medical Decision Making/ A&P   {   Click here for ABCD2, HEART and other calculatorsREFRESH Note before signing :1}                          Medical Decision Making Amount and/or Complexity of Data Reviewed Radiology: ordered.   ***  {Document critical care time when appropriate:1} {Document review of labs and clinical decision tools ie heart score, Chads2Vasc2 etc:1}  {Document your independent review of radiology images, and any outside records:1} {Document your discussion with family members, caretakers, and with consultants:1} {Document social determinants of health affecting pt's care:1} {Document your decision making why or why not admission, treatments were needed:1} Final Clinical Impression(s) / ED Diagnoses Final diagnoses:  None    Rx / DC Orders ED Discharge Orders     None

## 2023-04-08 NOTE — ED Triage Notes (Signed)
Patient arrived after an unwitnessed fall with complaints of left arm pain. A &O x1 at baseline

## 2023-04-09 ENCOUNTER — Emergency Department (HOSPITAL_COMMUNITY): Payer: Medicare Other

## 2023-04-09 NOTE — ED Notes (Signed)
Ortho tech paged for application of splint. 

## 2023-04-09 NOTE — ED Notes (Signed)
[id number] called for [facility] transport for pt back to SNF

## 2023-04-09 NOTE — Progress Notes (Signed)
Orthopedic Tech Progress Note Patient Details:  Tiffany Black 1933/11/18 [id number]  Ortho Devices Type of Ortho Device: Post (long arm) splint, Arm sling Ortho Device/Splint Location: lue Ortho Device/Splint Interventions: Ordered, Application, Adjustment  I applied splint to patient. The patient did well. Then I applied the arm sling. Post Interventions Patient Tolerated: Well Instructions Provided: Care of device, Adjustment of device  [name] 04/09/2023, 2:19 AM

## 2023-04-09 NOTE — Discharge Instructions (Signed)
Continue taking tylenol as needed for pain. Follow-up with [facility]-- call in the morning to get this scheduled. Return here for new concerns.

## 2023-05-01 ENCOUNTER — Emergency Department (HOSPITAL_COMMUNITY): Payer: Medicare Other

## 2023-05-01 ENCOUNTER — Encounter (HOSPITAL_COMMUNITY): Payer: Self-pay

## 2023-05-01 ENCOUNTER — Emergency Department (HOSPITAL_COMMUNITY)
Admission: EM | Admit: 2023-05-01 | Discharge: 2023-05-02 | Disposition: A | Discharge status: Home / Self Care | Payer: Medicare Other | Attending: Emergency Medicine | Admitting: Emergency Medicine

## 2023-05-01 DIAGNOSIS — Z91040 Latex allergy status: Secondary | ICD-10-CM | POA: Diagnosis not present

## 2023-05-01 DIAGNOSIS — S0990XA Unspecified injury of head, initial encounter: Secondary | ICD-10-CM | POA: Insufficient documentation

## 2023-05-01 DIAGNOSIS — F0390 Unspecified dementia without behavioral disturbance: Secondary | ICD-10-CM | POA: Diagnosis not present

## 2023-05-01 DIAGNOSIS — Y92002 Bathroom of unspecified non-institutional (private) residence single-family (private) house as the place of occurrence of the external cause: Secondary | ICD-10-CM | POA: Diagnosis not present

## 2023-05-01 DIAGNOSIS — S62515A Nondisplaced fracture of proximal phalanx of left thumb, initial encounter for closed fracture: Secondary | ICD-10-CM | POA: Insufficient documentation

## 2023-05-01 DIAGNOSIS — M79642 Pain in left hand: Secondary | ICD-10-CM | POA: Diagnosis not present

## 2023-05-01 DIAGNOSIS — I10 Essential (primary) hypertension: Secondary | ICD-10-CM | POA: Insufficient documentation

## 2023-05-01 DIAGNOSIS — I4819 Other persistent atrial fibrillation: Secondary | ICD-10-CM | POA: Diagnosis not present

## 2023-05-01 DIAGNOSIS — M79645 Pain in left finger(s): Secondary | ICD-10-CM | POA: Insufficient documentation

## 2023-05-01 DIAGNOSIS — Z7901 Long term (current) use of anticoagulants: Secondary | ICD-10-CM | POA: Insufficient documentation

## 2023-05-01 DIAGNOSIS — Z8542 Personal history of malignant neoplasm of other parts of uterus: Secondary | ICD-10-CM | POA: Diagnosis not present

## 2023-05-01 DIAGNOSIS — W010XXA Fall on same level from slipping, tripping and stumbling without subsequent striking against object, initial encounter: Secondary | ICD-10-CM | POA: Diagnosis not present

## 2023-05-01 DIAGNOSIS — Y9301 Activity, walking, marching and hiking: Secondary | ICD-10-CM | POA: Diagnosis not present

## 2023-05-01 DIAGNOSIS — Z79899 Other long term (current) drug therapy: Secondary | ICD-10-CM | POA: Diagnosis not present

## 2023-05-01 DIAGNOSIS — S6992XA Unspecified injury of left wrist, hand and finger(s), initial encounter: Secondary | ICD-10-CM | POA: Diagnosis present

## 2023-05-01 LAB — CBC WITH DIFFERENTIAL/PLATELET
Abs Immature Granulocytes: 0.01 10*3/uL (ref 0.00–0.07)
Basophils Absolute: 0 10*3/uL (ref 0.0–0.1)
Basophils Relative: 1 %
Eosinophils Absolute: 0.3 10*3/uL (ref 0.0–0.5)
Eosinophils Relative: 5 %
HCT: 37.7 % (ref 36.0–46.0)
Hemoglobin: 11.9 g/dL — ABNORMAL LOW (ref 12.0–15.0)
Immature Granulocytes: 0 %
Lymphocytes Relative: 22 %
Lymphs Abs: 1.2 10*3/uL (ref 0.7–4.0)
MCH: 28.6 pg (ref 26.0–34.0)
MCHC: 31.6 g/dL (ref 30.0–36.0)
MCV: 90.6 fL (ref 80.0–100.0)
Monocytes Absolute: 0.6 10*3/uL (ref 0.1–1.0)
Monocytes Relative: 10 %
Neutro Abs: 3.4 10*3/uL (ref 1.7–7.7)
Neutrophils Relative %: 62 %
Platelets: 197 10*3/uL (ref 150–400)
RBC: 4.16 MIL/uL (ref 3.87–5.11)
RDW: 14.6 % (ref 11.5–15.5)
WBC: 5.5 10*3/uL (ref 4.0–10.5)
nRBC: 0 % (ref 0.0–0.2)

## 2023-05-01 LAB — COMPREHENSIVE METABOLIC PANEL
ALT: 11 U/L (ref 0–44)
AST: 16 U/L (ref 15–41)
Albumin: 3.5 g/dL (ref 3.5–5.0)
Alkaline Phosphatase: 88 U/L (ref 38–126)
Anion gap: 8 (ref 5–15)
BUN: 31 mg/dL — ABNORMAL HIGH (ref 8–23)
CO2: 26 mmol/L (ref 22–32)
Calcium: 8.9 mg/dL (ref 8.9–10.3)
Chloride: 99 mmol/L (ref 98–111)
Creatinine, Ser: 1.36 mg/dL — ABNORMAL HIGH (ref 0.44–1.00)
GFR, Estimated: 38 mL/min — ABNORMAL LOW (ref >60)
Glucose, Bld: 97 mg/dL (ref 70–99)
Potassium: 4.1 mmol/L (ref 3.5–5.1)
Sodium: 133 mmol/L — ABNORMAL LOW (ref 135–145)
Total Bilirubin: 0.4 mg/dL (ref 0.3–1.2)
Total Protein: 6.5 g/dL (ref 6.5–8.1)

## 2023-05-01 NOTE — ED Provider Triage Note (Signed)
Emergency Medicine Provider Triage Evaluation Note  Tiffany Black , a 89 y.o. female  was evaluated in triage.  Pt complains of possible fall.  Patient is demented from the facility.  Patient had a possible fall earlier and was complaining of left thumb pain.  Patient had recent left coronoid process fracture and currently has a splint in place  Review of Systems  Positive: Dementia and left thumb pain Negative:   Physical Exam  BP (!) 153/112   Pulse 74   Temp 97.8 F (36.6 C) (Oral)   Resp 15   SpO2 100%  Gen:   Awake, no distress , pleasantly demented Resp:  Normal effort  MSK:   Patient has a left forearm splint in place.  Mild tenderness in the left thumb Other:    Medical Decision Making  Medically screening exam initiated at 11:32 PM.  Appropriate orders placed.  Tiffany Black was informed that the remainder of the evaluation will be completed by another provider, this initial triage assessment does not replace that evaluation, and the importance of remaining in the ED until their evaluation is complete.  Tiffany Black is a 89 y.o. female here presenting with a fall.  Patient is on blood thinner.  Patient has no visible signs of head injury.  However since she is demented, will get CT head and cervical spine.  Patient had a recent coronoid process fracture and will get repeat left hand x-ray.    Tiffany Pander, MD 05/01/23 [phone]

## 2023-05-01 NOTE — ED Notes (Signed)
Pt is alert and oriented to self/ this nurse called son (chris) to get more information. Pt is not following directions appropriately, but can be redirected

## 2023-05-01 NOTE — ED Triage Notes (Addendum)
Pt IS ON BLOOD THINNERS. Medic reports she fell while walking to the bathroom and slipped which caused her to fall, Pt states she only has pain in her left thumb and is in a brace form a previous fracture. No Cspine tenderness, pt does not express any pain any where else as well but does have dementia.   Medic Vitals  136/88 82hr 96%ra 18rr

## 2023-05-02 ENCOUNTER — Emergency Department (HOSPITAL_COMMUNITY): Payer: Medicare Other

## 2023-05-02 NOTE — Discharge Instructions (Addendum)
You have a fracture of your left thumb.  Keep immobilized with splint provided.  Follow-up with [facility] again for follow-up.

## 2023-05-02 NOTE — ED Provider Notes (Signed)
[place] EMERGENCY DEPARTMENT AT [facility] Provider Note   CSN: [id number] Arrival date & time: 05/01/23  2228     History  Chief Complaint  Patient presents with   Tiffany Black is a 89 y.o. female.  HPI     This is an 89 year old female who presents following a fall.  Patient comes from [place].  She is only oriented to herself which is at her baseline per report.  Complaining of left thumb pain.  Has a splint in place on the left.  Patient provides minimal history.  Level 5 caveat  Home Medications Prior to Admission medications   Medication Sig Start Date End Date Taking? Authorizing Provider  acetaminophen (TYLENOL) 325 MG tablet Take 650 mg by mouth every 6 (six) hours as needed for moderate pain.    [provider]  amLODipine (NORVASC) 10 MG tablet Take 1 tablet (10 mg total) by mouth daily. 02/18/23   Rai, Ripudeep K, MD  bumetanide (BUMEX) 1 MG tablet Take 1 tablet (1 mg total) by mouth daily. 02/18/23   Rai, Delene Ruffini, MD  FLUoxetine (PROZAC) 10 MG capsule Take 10 mg by mouth daily.    [provider]  LORazepam (ATIVAN) 1 MG tablet Take 1 tablet (1 mg total) by mouth every 8 (eight) hours as needed for anxiety (agitation). 02/17/23   Rai, Delene Ruffini, MD  metoprolol tartrate (LOPRESSOR) 50 MG tablet Take 50 mg by mouth 2 (two) times daily. 01/15/23   [provider]  nystatin (MYCOSTATIN/NYSTOP) powder Apply 1 Application topically 2 (two) times daily. 01/11/23   [provider]  potassium chloride SA (KLOR-CON M) 20 MEQ tablet Take 0.5 tablets (10 mEq total) by mouth daily. 02/17/23   Rai, Delene Ruffini, MD  QUEtiapine (SEROQUEL) 50 MG tablet Take 1 tablet (50 mg total) by mouth at bedtime. 08/15/22   Elgergawy, Leana Roe, MD  Rivaroxaban (XARELTO) 15 MG TABS tablet Take 1 tablet (15 mg total) by mouth daily with supper. Currently on hold 02/17/23   Rai, Ripudeep K, MD  senna-docusate (SENOKOT-S) 8.6-50 MG tablet Take 1  tablet by mouth at bedtime as needed for mild constipation. 10/02/22   Almon Hercules, MD  spironolactone (ALDACTONE) 25 MG tablet Take 1 tablet (25 mg total) by mouth daily. 02/18/23   Rai, Ripudeep K, MD  valsartan (DIOVAN) 160 MG tablet Take 80 mg by mouth 2 (two) times daily.    [provider]      Allergies    Hydrochlorothiazide, Penicillins, Ciprofloxacin, Doxycycline, Hydrocodone-acetaminophen, Iodinated contrast media, Iodine-131, Lasix [furosemide], Latex, Niaspan [niacin], Statins, Carbamazepine, Codeine, Nickel, Sulfa antibiotics, Sulfamethoxazole, and Sulfonamide derivatives    Review of Systems   Review of Systems  Constitutional:  Negative for fever.  Musculoskeletal:  Negative for neck pain.       Thumb pain  Neurological:  Negative for headaches.  All other systems reviewed and are negative.   Physical Exam Updated Vital Signs BP (!) 153/89 (BP Location: Right Arm)   Pulse (!) 58   Temp 97.8 F (36.6 C) (Oral)   Resp 14   SpO2 100%  Physical Exam Vitals and nursing note reviewed.  Constitutional:      Appearance: She is well-developed.     Comments: Elderly, nontoxic-appearing  HENT:     Head: Normocephalic and atraumatic.  Eyes:     Pupils: Pupils are equal, round, and reactive to light.  Cardiovascular:     Rate and  Rhythm: Normal rate and regular rhythm.     Heart sounds: Normal heart sounds.  Pulmonary:     Effort: Pulmonary effort is normal. No respiratory distress.     Breath sounds: No wheezing.  Abdominal:     Palpations: Abdomen is soft.  Musculoskeletal:     Cervical back: Neck supple.     Comments: Left arm in splint, swelling and bruising noted to the left thumb, pain with extension of the left thumb, no obvious deformities Normal range of motion bilateral hips and knees  Skin:    General: Skin is warm and dry.  Neurological:     Mental Status: She is alert.     Comments: Oriented to self  Psychiatric:        Mood and Affect: Mood  normal.     ED Results / Procedures / Treatments   Labs (all labs ordered are listed, but only abnormal results are displayed) Labs Reviewed  CBC WITH DIFFERENTIAL/PLATELET - Abnormal; Notable for the following components:      Result Value   Hemoglobin 11.9 (*)    All other components within normal limits  COMPREHENSIVE METABOLIC PANEL - Abnormal; Notable for the following components:   Sodium 133 (*)    BUN 31 (*)    Creatinine, Ser 1.36 (*)    GFR, Estimated 38 (*)    All other components within normal limits    EKG None  Radiology DG Finger Thumb Left  Result Date: 05/02/2023 CLINICAL DATA:  Left thumb pain after fall EXAM: LEFT THUMB 2+V COMPARISON:  Hand radiographs 05/01/2023 FINDINGS: Nondisplaced intra-articular fracture of the ulnar base of the proximal phalanx of the thumb. Demineralization. Soft tissues are radiographically unremarkable. IMPRESSION: Nondisplaced intra-articular fracture of the ulnar base of the proximal phalanx of the thumb. Electronically Signed   By: Minerva Fester M.D.   On: 05/02/2023 00:21   DG Chest 1 View  Result Date: 05/01/2023 CLINICAL DATA:  Fall EXAM: CHEST  1 VIEW COMPARISON:  04/09/2023 FINDINGS: The lungs are symmetrically well expanded. Small right pleural effusion, decreased in size since prior examination. No superimposed confluent pulmonary infiltrate. No pneumothorax. Stable mild cardiomegaly. Pulmonary vascularity is normal. No acute bone abnormality. IMPRESSION: 1. Small right pleural effusion, decreased in size since prior examination. Electronically Signed   By: Helyn Numbers M.D.   On: 05/01/2023 23:33   DG Pelvis 1-2 Views  Result Date: 05/01/2023 CLINICAL DATA:  Fall EXAM: PELVIS - 1-2 VIEW COMPARISON:  04/09/2023 FINDINGS: Normal alignment. No acute fracture or dislocation. Mild bilateral degenerative hip arthritis, right greater than left. Soft tissues are unremarkable. IMPRESSION: 1. Mild bilateral degenerative hip  arthritis. Electronically Signed   By: Helyn Numbers M.D.   On: 05/01/2023 23:31   CT HEAD WO CONTRAST ( )  Result Date: 05/01/2023 CLINICAL DATA:  Fall with head and neck trauma on blood thinners. History of dementia. EXAM: CT HEAD WITHOUT CONTRAST CT CERVICAL SPINE WITHOUT CONTRAST TECHNIQUE: Multidetector CT imaging of the head and cervical spine was performed following the standard protocol without intravenous contrast. Multiplanar CT image reconstructions of the cervical spine were also generated. RADIATION DOSE REDUCTION: This exam was performed according to the departmental dose-optimization program which includes automated exposure control, adjustment of the mA and/or kV according to patient size and/or use of iterative reconstruction technique. COMPARISON:  CT head and C-spine 04/09/2023 FINDINGS: CT HEAD FINDINGS Brain: No intracranial hemorrhage, mass effect, or evidence of acute infarct. No hydrocephalus. No extra-axial fluid collection. Generalized cerebral  atrophy. Ill-defined hypoattenuation within the cerebral white matter is nonspecific but consistent with chronic small vessel ischemic disease. Vascular: No hyperdense vessel. Intracranial arterial calcification. Skull: No fracture or focal lesion. Sinuses/Orbits: No acute finding.  Chronic left maxillary sinusitis. Other: None. CT CERVICAL SPINE FINDINGS Alignment: No evidence of traumatic malalignment. Skull base and vertebrae: No acute fracture. No primary bone lesion or focal pathologic process. Soft tissues and spinal canal: No prevertebral fluid or swelling. No visible canal hematoma. Disc levels: Multilevel facet arthropathy, spondylosis, disc space height loss, and degenerative endplate changes. Upper chest: Stable chronic right upper lobe 3 mm pulmonary nodule-no further follow-up indicated. Biapical pleural/pulmonary scarring. Other: Carotid calcification. IMPRESSION: 1. No acute intracranial abnormality. 2. No acute fracture in the  cervical spine. Electronically Signed   By: Minerva Fester M.D.   On: 05/01/2023 23:26   CT Cervical Spine Wo Contrast  Result Date: 05/01/2023 CLINICAL DATA:  Fall with head and neck trauma on blood thinners. History of dementia. EXAM: CT HEAD WITHOUT CONTRAST CT CERVICAL SPINE WITHOUT CONTRAST TECHNIQUE: Multidetector CT imaging of the head and cervical spine was performed following the standard protocol without intravenous contrast. Multiplanar CT image reconstructions of the cervical spine were also generated. RADIATION DOSE REDUCTION: This exam was performed according to the departmental dose-optimization program which includes automated exposure control, adjustment of the mA and/or kV according to patient size and/or use of iterative reconstruction technique. COMPARISON:  CT head and C-spine 04/09/2023 FINDINGS: CT HEAD FINDINGS Brain: No intracranial hemorrhage, mass effect, or evidence of acute infarct. No hydrocephalus. No extra-axial fluid collection. Generalized cerebral atrophy. Ill-defined hypoattenuation within the cerebral white matter is nonspecific but consistent with chronic small vessel ischemic disease. Vascular: No hyperdense vessel. Intracranial arterial calcification. Skull: No fracture or focal lesion. Sinuses/Orbits: No acute finding.  Chronic left maxillary sinusitis. Other: None. CT CERVICAL SPINE FINDINGS Alignment: No evidence of traumatic malalignment. Skull base and vertebrae: No acute fracture. No primary bone lesion or focal pathologic process. Soft tissues and spinal canal: No prevertebral fluid or swelling. No visible canal hematoma. Disc levels: Multilevel facet arthropathy, spondylosis, disc space height loss, and degenerative endplate changes. Upper chest: Stable chronic right upper lobe 3 mm pulmonary nodule-no further follow-up indicated. Biapical pleural/pulmonary scarring. Other: Carotid calcification. IMPRESSION: 1. No acute intracranial abnormality. 2. No acute  fracture in the cervical spine. Electronically Signed   By: Minerva Fester M.D.   On: 05/01/2023 23:26   DG Hand Complete Left  Result Date: 05/01/2023 CLINICAL DATA:  Fall.  Left thumb pain. EXAM: LEFT HAND - COMPLETE 3+ VIEW COMPARISON:  Left forearm radiographs 04/08/2023 FINDINGS: There is again irregularity distal radius from an old healed fracture deformity. There is associated 6 mm ulnar positive variance. Moderate to severe triscaphe, moderate thumb carpometacarpal, moderate to severe thumb interphalangeal, moderate third greater than second DIP, and mild-to-moderate fourth and fifth DIP and second through fifth PIP joint space narrowing, subchondral sclerosis, and peripheral osteophytosis. No acute fracture is seen. IMPRESSION: 1. Within the limitation of diffuse decreased bone mineralization, no acute fracture is seen. 2. Old healed fracture deformity of the distal radius. 3. Moderate to severe triscaphe, thumb carpometacarpal, and thumb interphalangeal osteoarthritis. Electronically Signed   By: Neita Garnet M.D.   On: 05/01/2023 23:26    Procedures Procedures    Medications Ordered in ED Medications - No data to display  ED Course/ Medical Decision Making/ A&P  Medical Decision Making Amount and/or Complexity of Data Reviewed Radiology: ordered.   This patient presents to the ED for concern of fall, this involves an extensive number of treatment options, and is a complaint that carries with it a high risk of complications and morbidity.  I considered the following differential and admission for this acute, potentially life threatening condition.  The differential diagnosis includes long bone injury, fracture, head injury, neck injury  MDM:    This is an 89 year old female who presents following reported fall.  Limited history given patient's history of dementia.  Attempted to call son but got his voicemail.  Complaining only of left thumb pain.   Workup from triage reviewed.  Labs reassuring.  CT head neck negative.  Left hand read is negative.  I added a thumb film which did indicate a proximal phalanx fracture.  She currently has her left arm in a splint for an ulnar coronoid fracture.  This was 3 weeks ago.  I have reviewed these films.  Will transition her to a thumb spica as this likely will provide enough stability.  She was provided with a Velcro thumb spica as I feel that a full splint would be heavy and difficult for her to navigate.  She was previously referred to [facility].  Will have her follow back up.  (Labs, imaging, consults)  Labs: I Ordered, and personally interpreted labs.  The pertinent results include: CBC, BMP  Imaging Studies ordered: I ordered imaging studies including CT head, cervical spine, left hand, left thumb I independently visualized and interpreted imaging. I agree with the radiologist interpretation  Additional history obtained from EMS.  External records from outside source obtained and reviewed including the ED visit  Cardiac Monitoring: The patient was's maintained on a cardiac monitor.  If on the cardiac monitor, I personally viewed and interpreted the cardiac monitored which showed an underlying rhythm of: Sinus rhythm  Reevaluation: After the interventions noted above, I reevaluated the patient and found that they have :stayed the same  Social Determinants of Health:  dementia, lives in living facility  Disposition: Discharge  Co morbidities that complicate the patient evaluation  Past Medical History:  Diagnosis Date   Anxiety    Arthritis    BPPV (benign paroxysmal positional vertigo)    Breast lesion    left   Bulging of cervical intervertebral disc    Cardiomegaly    Cataracts, bilateral    Chronic neck pain    CVA (cerebral vascular accident) (HCC)    x2 2013 and 2015; right temporal lobe and left frontal lobe infarct-> dzziness which resolved; occluded right PCA, moderate  posterior left cerebral stenosis, left MCA stenosis on MRA   Dementia (HCC)    Essential hypertension    History of endometrial cancer    History of stroke    2015 and 2016 // carotid US 8/16: Bilateral 1-39 // carotid US 2/16: Bilateral ICA 1-39   HLD (hyperlipidemia)    Hx of echocardiogram    a. Echo (11/15): Mild LVH, EF 55-60%, normal wall motion, grade 2 diastolic dysfunction, mild LAE, normal RV function, PASP 29 mm Hg // echo 8/16:Mild concentric LVH, EF 55-60, normal wall motion, grade 2 diastolic dysfunction, mild LAE, PASP 39   Hypertension    IBS (irritable bowel syndrome)    Insomnia    Lung nodule    OA (osteoarthritis)    Pectus excavatum    Persistent atrial fibrillation (HCC)    Pleural effusion  right recurrent, 1/24 s/p thoracentesis x 3 (cytology negative)- Dr Francine Graven   Pneumonia due to COVID-19 virus    Radius fracture    left distal   Vertigo    Vitamin D deficiency      Medicines No orders of the defined types were placed in this encounter.   I have reviewed the patients home medicines and have made adjustments as needed  Problem List / ED Course: Problem List Items Addressed This Visit   None Visit Diagnoses     Closed nondisplaced fracture of proximal phalanx of left thumb, initial encounter    -  Primary                   Final Clinical Impression(s) / ED Diagnoses Final diagnoses:  Closed nondisplaced fracture of proximal phalanx of left thumb, initial encounter    Rx / DC Orders ED Discharge Orders     None         Shon Baton, MD 05/02/23 0116

## 2023-05-02 NOTE — Progress Notes (Signed)
Orthopedic Tech Progress Note Patient Details:  Tiffany Black 12-Feb-1934 [id number]  Ortho Devices Type of Ortho Device: Thumb velcro splint Ortho Device/Splint Location: LUE Ortho Device/Splint Interventions: Ordered, Application, Adjustment   Post Interventions Patient Tolerated: Poor Instructions Provided: Care of device  [name] 05/02/2023, 1:04 AM

## 2023-06-06 ENCOUNTER — Ambulatory Visit: Payer: Medicare Other | Admitting: Pulmonary Disease

## 2023-08-16 ENCOUNTER — Encounter: Payer: Self-pay | Admitting: Pulmonary Disease

## 2023-08-16 ENCOUNTER — Ambulatory Visit: Payer: Medicare Other | Admitting: Pulmonary Disease

## 2023-08-16 VITALS — BP 111/72 | HR 86 | Temp 97.8°F | Ht 65.0 in | Wt 145.6 lb

## 2023-08-16 DIAGNOSIS — J90 Pleural effusion, not elsewhere classified: Secondary | ICD-10-CM

## 2023-08-16 NOTE — Progress Notes (Signed)
Synopsis: Hx of pleural effusion  Subjective:   PATIENT ID: Tiffany Black GENDER: female DOB: Jan 17, 1934, MRN: [id number]   HPI  Chief Complaint  Patient presents with   Consult     She is has no concerns , coughing has gotten better    Tiffany Black is an 89 year old woman with chronic diastolic heart failure, pAF, HTN, advanced dementia and recurrent effusions.   She had recurrent transudate effusions this past year but has been stable since being putting on adequate diuretic therapy.   She denies issues with cough or shortness of breath. Her son is with her on today's visit.   The patient's son reports that she has been doing well with no complaints of coughing or shortness of breath. The patient is currently on Bumex, a diuretic, which she takes regularly. The patient resides in a care facility, [facility], due to memory issues. The patient's son is actively involved in her care and reports that she has been doing well with no complaints of coughing or shortness of breath. The patient also reports no pain in her lungs.  Past Medical History:  Diagnosis Date   Anxiety    Arthritis    BPPV (benign paroxysmal positional vertigo)    Breast lesion    left   Bulging of cervical intervertebral disc    Cardiomegaly    Cataracts, bilateral    Chronic neck pain    CVA (cerebral vascular accident) (HCC)    x2 2013 and 2015; right temporal lobe and left frontal lobe infarct-> dzziness which resolved; occluded right PCA, moderate posterior left cerebral stenosis, left MCA stenosis on MRA   Dementia [facility])    Essential hypertension    History of endometrial cancer    History of stroke    2015 and 2016 // carotid US 8/16: Bilateral 1-39 // carotid US 2/16: Bilateral ICA 1-39   HLD (hyperlipidemia)    Hx of echocardiogram    a. Echo (11/15): Mild LVH, EF 55-60%, normal wall motion, grade 2 diastolic dysfunction, mild LAE, normal RV function, PASP 29 mm Hg // echo 8/16:Mild  concentric LVH, EF 55-60, normal wall motion, grade 2 diastolic dysfunction, mild LAE, PASP 39   Hypertension    IBS (irritable bowel syndrome)    Insomnia    Lung nodule    OA (osteoarthritis)    Pectus excavatum    Persistent atrial fibrillation (HCC)    Pleural effusion    right recurrent, 1/24 s/p thoracentesis x 3 (cytology negative)- Dr Francine Graven   Pneumonia due to COVID-19 virus    Radius fracture    left distal   Vertigo    Vitamin D deficiency      Family History  Problem Relation Age of Onset   Stroke Mother    Heart attack Father    Heart disease Father    Dementia Sister    CAD Brother    Stroke Maternal Uncle    Hypertension Neg Hx      Social History   Socioeconomic History   Marital status: Widowed    Spouse name: Not on file   Number of children: 3   Years of education: 58   Highest education level: Not on file  Occupational History   Occupation: RetiredTherapist, nutritional  Tobacco Use   Smoking status: Never   Smokeless tobacco: Never  Vaping Use   Vaping status: Never Used  Substance and Sexual Activity   Alcohol use: No   Drug use: No  Sexual activity: Never  Other Topics Concern   Not on file  Social History Narrative   Diet: N/A   Caffeine: N/A   Married, if yes what year: Widow, married 1956   Do you live in a house, apartment, assisted living, condo, trailer, ect: House, one stories, 2 persons   Pets: None   Highest level of education: 12th Grade    Current/Past profession: Diplomatic Services operational officer    Exercise: Yes, walking everyday    Right handed   Living Will:    DNR:   POA/HPOA:   Functional Status:   Do you have difficulty bathing or dressing yourself? No   Do you have difficulty preparing food or eating? No    Do you have difficulty managing your medications? No   Do you have difficulty managing your finances? No    Do you have difficulty affording your medications? Yes   Social Determinants of Health   Financial Resource Strain: Not on file   Food Insecurity: No Food Insecurity (02/17/2023)   Hunger Vital Sign    Worried About Running Out of Food in the Last Year: Never true    Ran Out of Food in the Last Year: Never true  Transportation Needs: No Transportation Needs (02/17/2023)   PRAPARE - Administrator, Civil Service (Medical): No    Lack of Transportation (Non-Medical): No  Physical Activity: Not on file  Stress: Not on file  Social Connections: Not on file  Intimate Partner Violence: Not At Risk (02/17/2023)   Humiliation, Afraid, Rape, and Kick questionnaire    Fear of Current or Ex-Partner: No    Emotionally Abused: No    Physically Abused: No    Sexually Abused: No     Allergies  Allergen Reactions   Hydrochlorothiazide Hives   Penicillins Hives    Has patient had a PCN reaction causing immediate rash, facial/tongue/throat swelling, SOB or lightheadedness with hypotension: Yes Has patient had a PCN reaction causing severe rash involving mucus membranes or skin necrosis: No Has patient had a PCN reaction that required hospitalization No Has patient had a PCN reaction occurring within the last 10 years: No If all of the above answers are "NO", then may proceed with Cephalosporin use.    Ciprofloxacin Other (See Comments) and Hypertension    Blood pressure issues- patient doesn't recall if it increased OR decreased it     Doxycycline Diarrhea and Nausea And Vomiting   Hydrocodone-Acetaminophen Other (See Comments)    Unknown reaction   Iodinated Contrast Media Diarrhea, Other (See Comments) and Hypertension    Elevated the B/P   Iodine-131 Other (See Comments) and Hypertension    Raised the blood pressure   Lasix [Furosemide] Other (See Comments)    No reaction listed on order summary report from [facility]   Latex Itching   Niaspan [Niacin]     Flu (from [facility] notes)   Statins Other (See Comments)    Muscle weakness   Carbamazepine Other (See Comments) and Rash    Flushed blood pressure medication out  of system   Codeine Nausea And Vomiting   Nickel Rash   Sulfa Antibiotics Hives and Rash   Sulfamethoxazole Hives and Rash   Sulfonamide Derivatives Hives and Rash          Outpatient Medications Prior to Visit  Medication Sig Dispense Refill   acetaminophen (TYLENOL) 325 MG tablet Take 650 mg by mouth every 6 (six) hours as needed for moderate pain.  amLODipine (NORVASC) 10 MG tablet Take 1 tablet (10 mg total) by mouth daily.     bumetanide (BUMEX) 1 MG tablet Take 1 tablet (1 mg total) by mouth daily.     FLUoxetine (PROZAC) 10 MG capsule Take 10 mg by mouth daily.     LORazepam (ATIVAN) 1 MG tablet Take 1 tablet (1 mg total) by mouth every 8 (eight) hours as needed for anxiety (agitation). 20 tablet 0   metoprolol tartrate (LOPRESSOR) 50 MG tablet Take 50 mg by mouth 2 (two) times daily.     nystatin (MYCOSTATIN/NYSTOP) powder Apply 1 Application topically 2 (two) times daily.     potassium chloride SA (KLOR-CON M) 20 MEQ tablet Take 0.5 tablets (10 mEq total) by mouth daily.     QUEtiapine (SEROQUEL) 50 MG tablet Take 1 tablet (50 mg total) by mouth at bedtime.     Rivaroxaban (XARELTO) 15 MG TABS tablet Take 1 tablet (15 mg total) by mouth daily with supper. Currently on hold 30 tablet 5   senna-docusate (SENOKOT-S) 8.6-50 MG tablet Take 1 tablet by mouth at bedtime as needed for mild constipation.     spironolactone (ALDACTONE) 25 MG tablet Take 1 tablet (25 mg total) by mouth daily.     valsartan (DIOVAN) 160 MG tablet Take 80 mg by mouth 2 (two) times daily.     No facility-administered medications prior to visit.    Review of Systems  Constitutional:  Negative for chills, fever, malaise/fatigue and weight loss.  HENT:  Negative for congestion, sinus pain and sore throat.   Eyes: Negative.   Respiratory:  Negative for cough, hemoptysis, sputum production, shortness of breath and wheezing.   Cardiovascular:  Negative for chest pain, palpitations, orthopnea, claudication  and leg swelling.  Gastrointestinal:  Negative for abdominal pain, heartburn, nausea and vomiting.  Genitourinary: Negative.   Musculoskeletal:  Negative for joint pain and myalgias.  Skin:  Negative for rash.  Neurological:  Negative for weakness.  Endo/Heme/Allergies: Negative.   Psychiatric/Behavioral: Negative.        Objective:   Vitals:   08/16/23 1412  BP: 111/72  Pulse: 86  Temp: 97.8 F (36.6 C)  SpO2: 96%  Weight: 145 lb 9.6 oz (66 kg)  Height: 5\' 5"  (1.651 m)     Physical Exam Constitutional:      General: She is not in acute distress.    Appearance: Normal appearance.  Eyes:     General: No scleral icterus.    Conjunctiva/sclera: Conjunctivae normal.  Cardiovascular:     Rate and Rhythm: Normal rate and regular rhythm.  Pulmonary:     Breath sounds: No wheezing, rhonchi or rales.  Musculoskeletal:     Right lower leg: No edema.     Left lower leg: No edema.  Skin:    General: Skin is warm and dry.  Neurological:     General: No focal deficit present.       CBC    Component Value Date/Time   WBC 5.5 05/01/2023 2244   RBC 4.16 05/01/2023 2244   HGB 11.9 (L) 05/01/2023 2244   HGB 12.5 06/13/2022 1145   HCT 37.7 05/01/2023 2244   HCT 38.0 06/13/2022 1145   PLT 197 05/01/2023 2244   PLT 169 06/13/2022 1145   MCV 90.6 05/01/2023 2244   MCV 92 06/13/2022 1145   MCH 28.6 05/01/2023 2244   MCHC 31.6 05/01/2023 2244   RDW 14.6 05/01/2023 2244   RDW 13.1 06/13/2022 1145   LYMPHSABS  1.2 05/01/2023 2244   LYMPHSABS 1.7 11/28/2017 1135   MONOABS 0.6 05/01/2023 2244   EOSABS 0.3 05/01/2023 2244   EOSABS 0.2 11/28/2017 1135   BASOSABS 0.0 05/01/2023 2244   BASOSABS 0.0 11/28/2017 1135     Chest imaging: CXR 05/01/23 The lungs are symmetrically well expanded. Small right pleural effusion, decreased in size since prior examination. No superimposed confluent pulmonary infiltrate. No pneumothorax. Stable mild cardiomegaly. Pulmonary vascularity  is normal. No acute bone abnormality.  PFT:     No data to display          Labs:  Path:  Echo:  Heart Catheterization:       Assessment & Plan:   Recurrent right pleural effusion  Discussion: Dan Mamon is an 89 year old woman with chronic diastolic heart failure, pAF, HTN, advanced dementia and recurrent effusions.   # Recurrent transudative pleural effusion # Recurrent admissions, advanced dementia  # Elevated RVSP # Chronic diastolic heart failure   Effusions appear stable since being on diuretic therapy.   - bumex 1 mg daily per cardiology - can arrange repeat thoracentesis if needed      Follow up as needed.  Melody Comas, MD [facility] Office: [phone]      Current Outpatient Medications:    acetaminophen (TYLENOL) 325 MG tablet, Take 650 mg by mouth every 6 (six) hours as needed for moderate pain., Disp: , Rfl:    amLODipine (NORVASC) 10 MG tablet, Take 1 tablet (10 mg total) by mouth daily., Disp: , Rfl:    bumetanide (BUMEX) 1 MG tablet, Take 1 tablet (1 mg total) by mouth daily., Disp: , Rfl:    FLUoxetine (PROZAC) 10 MG capsule, Take 10 mg by mouth daily., Disp: , Rfl:    LORazepam (ATIVAN) 1 MG tablet, Take 1 tablet (1 mg total) by mouth every 8 (eight) hours as needed for anxiety (agitation)., Disp: 20 tablet, Rfl: 0   metoprolol tartrate (LOPRESSOR) 50 MG tablet, Take 50 mg by mouth 2 (two) times daily., Disp: , Rfl:    nystatin (MYCOSTATIN/NYSTOP) powder, Apply 1 Application topically 2 (two) times daily., Disp: , Rfl:    potassium chloride SA (KLOR-CON M) 20 MEQ tablet, Take 0.5 tablets (10 mEq total) by mouth daily., Disp: , Rfl:    QUEtiapine (SEROQUEL) 50 MG tablet, Take 1 tablet (50 mg total) by mouth at bedtime., Disp: , Rfl:    Rivaroxaban (XARELTO) 15 MG TABS tablet, Take 1 tablet (15 mg total) by mouth daily with supper. Currently on hold, Disp: 30 tablet, Rfl: 5   senna-docusate (SENOKOT-S) 8.6-50  MG tablet, Take 1 tablet by mouth at bedtime as needed for mild constipation., Disp: , Rfl:    spironolactone (ALDACTONE) 25 MG tablet, Take 1 tablet (25 mg total) by mouth daily., Disp: , Rfl:    valsartan (DIOVAN) 160 MG tablet, Take 80 mg by mouth 2 (two) times daily., Disp: , Rfl:

## 2023-08-16 NOTE — Patient Instructions (Signed)
Your lungs sounds great today, less likely any return of fluid around the lungs  Follow up as needed if respiratory symptoms start (cough, shortness of breath, wheezing)

## 2024-01-10 ENCOUNTER — Emergency Department (HOSPITAL_COMMUNITY)

## 2024-01-10 ENCOUNTER — Encounter (HOSPITAL_COMMUNITY): Payer: Self-pay

## 2024-01-10 ENCOUNTER — Other Ambulatory Visit: Payer: Self-pay

## 2024-01-10 ENCOUNTER — Emergency Department (HOSPITAL_COMMUNITY)
Admission: EM | Admit: 2024-01-10 | Discharge: 2024-01-11 | Disposition: A | Discharge status: Skilled Nursing Facility | Attending: Emergency Medicine | Admitting: Emergency Medicine

## 2024-01-10 DIAGNOSIS — M25552 Pain in left hip: Secondary | ICD-10-CM | POA: Insufficient documentation

## 2024-01-10 DIAGNOSIS — M25562 Pain in left knee: Secondary | ICD-10-CM | POA: Insufficient documentation

## 2024-01-10 DIAGNOSIS — W010XXA Fall on same level from slipping, tripping and stumbling without subsequent striking against object, initial encounter: Secondary | ICD-10-CM | POA: Insufficient documentation

## 2024-01-10 DIAGNOSIS — R7989 Other specified abnormal findings of blood chemistry: Secondary | ICD-10-CM

## 2024-01-10 DIAGNOSIS — F0390 Unspecified dementia without behavioral disturbance: Secondary | ICD-10-CM | POA: Insufficient documentation

## 2024-01-10 DIAGNOSIS — Z91040 Latex allergy status: Secondary | ICD-10-CM | POA: Insufficient documentation

## 2024-01-10 DIAGNOSIS — W19XXXA Unspecified fall, initial encounter: Secondary | ICD-10-CM

## 2024-01-10 MED ORDER — HALOPERIDOL LACTATE 5 MG/ML IJ SOLN
3.0000 mg | Freq: Once | INTRAMUSCULAR | Status: AC | PRN
  Administered 2024-01-11: 3 mg via INTRAMUSCULAR
  Filled 2024-01-10: qty 1

## 2024-01-10 MED ORDER — HALOPERIDOL LACTATE 5 MG/ML IJ SOLN
2.0000 mg | Freq: Once | INTRAMUSCULAR | Status: AC
  Administered 2024-01-10: 2 mg via INTRAMUSCULAR
  Filled 2024-01-10: qty 1

## 2024-01-10 NOTE — ED Provider Notes (Signed)
 [place] EMERGENCY DEPARTMENT AT [facility] Provider Note   CSN: [id number] Arrival date & time: 01/10/24  2102     History  Chief Complaint  Patient presents with   Fall   HPI  Tiffany Black is a 89 y.o. female with past medical history of dementia presents for evaluation after fall.  Patient had a witnessed fall at her facility after stumbling and falling.  She hit her head.  Did not lose consciousness.  Of note, patient is currently COVID-positive.  She does not take any blood thinners.  Patient denies any headaches, neck pain, chest pain, abdominal pain.  She does note pain to her left knee and hip.   Fall       Home Medications Prior to Admission medications   Medication Sig Start Date End Date Taking? Authorizing Provider  acetaminophen  (TYLENOL ) 325 MG tablet Take 650 mg by mouth every 6 (six) hours as needed for moderate pain.    [provider]  amLODipine  (NORVASC ) 10 MG tablet Take 1 tablet (10 mg total) by mouth daily. 02/18/23   Rai, Ripudeep Linnell Richardson, MD  bumetanide  (BUMEX ) 1 MG tablet Take 1 tablet (1 mg total) by mouth daily. 02/18/23   Rai, Hurman Maiden, MD  FLUoxetine  (PROZAC ) 10 MG capsule Take 10 mg by mouth daily.    [provider]  LORazepam  (ATIVAN ) 1 MG tablet Take 1 tablet (1 mg total) by mouth every 8 (eight) hours as needed for anxiety (agitation). 02/17/23   Rai, Hurman Maiden, MD  metoprolol  tartrate (LOPRESSOR ) 50 MG tablet Take 50 mg by mouth 2 (two) times daily. 01/15/23   [provider]  nystatin (MYCOSTATIN/NYSTOP) powder Apply 1 Application topically 2 (two) times daily. 01/11/23   [provider]  potassium chloride  SA (KLOR-CON  M) 20 MEQ tablet Take 0.5 tablets (10 mEq total) by mouth daily. 02/17/23   Rai, Hurman Maiden, MD  QUEtiapine  (SEROQUEL ) 50 MG tablet Take 1 tablet (50 mg total) by mouth at bedtime. 08/15/22   Elgergawy, Ardia Kraft, MD  Rivaroxaban  (XARELTO ) 15 MG TABS tablet Take 1 tablet (15 mg total) by  mouth daily with supper. Currently on hold 02/17/23   Rai, Ripudeep K, MD  senna-docusate (SENOKOT-S) 8.6-50 MG tablet Take 1 tablet by mouth at bedtime as needed for mild constipation. 10/02/22   Gonfa, Taye T, MD  spironolactone  (ALDACTONE ) 25 MG tablet Take 1 tablet (25 mg total) by mouth daily. 02/18/23   Rai, Hurman Maiden, MD  valsartan  (DIOVAN ) 160 MG tablet Take 80 mg by mouth 2 (two) times daily.    [provider]      Allergies    Hydrochlorothiazide, Penicillins, Ciprofloxacin , Doxycycline, Hydrocodone -acetaminophen , Iodinated contrast media, Iodine-131, Lasix [furosemide], Latex, Niaspan [niacin], Statins, Carbamazepine, Codeine, Nickel, Sulfa antibiotics, Sulfamethoxazole, and Sulfonamide derivatives    Review of Systems   Review of Systems  Physical Exam Updated Vital Signs BP 118/82 (BP Location: Right Arm)   Pulse 99   Temp 97.8 F (36.6 C) (Oral)   Resp 18   Ht 5\' 5"  (1.651 m)   Wt 66 kg   SpO2 97%   BMI 24.21 kg/m  Physical Exam Vitals and nursing note reviewed.  Constitutional:      General: She is not in acute distress.    Appearance: She is well-developed.  HENT:     Head: Normocephalic and atraumatic.     Right Ear: External ear normal.     Left Ear: External ear normal.  Nose: Nose normal.     Mouth/Throat:     Mouth: Mucous membranes are moist.  Eyes:     Extraocular Movements: Extraocular movements intact.     Conjunctiva/sclera: Conjunctivae normal.  Cardiovascular:     Rate and Rhythm: Normal rate and regular rhythm.     Pulses:          Radial pulses are 2+ on the right side and 2+ on the left side.       Dorsalis pedis pulses are 2+ on the right side and 2+ on the left side.       Posterior tibial pulses are 2+ on the right side and 2+ on the left side.     Heart sounds: No murmur heard. Pulmonary:     Effort: Pulmonary effort is normal. No respiratory distress.     Breath sounds: Normal breath sounds. No wheezing or rales.  Chest:      Chest wall: No tenderness.  Abdominal:     General: Abdomen is flat. There is no distension.     Palpations: Abdomen is soft.     Tenderness: There is no abdominal tenderness. There is no guarding or rebound.  Musculoskeletal:        General: No swelling.     Cervical back: Normal range of motion and neck supple. No tenderness.     Right lower leg: No edema.     Left lower leg: No edema.     Comments: Pelvis stable to lateral compression. Full ROM of bilateral upper extremities with no deformities, right lower extremity ROM with no difficulty.   Pain with left knee flexion with no overlying skin changes or swelling.  Skin:    General: Skin is warm and dry.     Capillary Refill: Capillary refill takes less than 2 seconds.  Neurological:     Mental Status: She is alert.  Psychiatric:        Mood and Affect: Mood normal.     ED Results / Procedures / Treatments   Labs (all labs ordered are listed, but only abnormal results are displayed) Labs Reviewed  CBC WITH DIFFERENTIAL/PLATELET  COMPREHENSIVE METABOLIC PANEL WITH GFR    EKG EKG Interpretation Date/Time:  Thursday Jan 10 2024 21:17:13 EDT Ventricular Rate:  72 PR Interval:    QRS Duration:  99 QT Interval:  397 QTC Calculation: 435 R Axis:   -63  Text Interpretation: Atrial fibrillation Left anterior fascicular block Abnormal R-wave progression, late transition Confirmed by Hiawatha Lout ([id number]) on 01/10/2024 10:29:26 PM  Radiology No results found.  Procedures Procedures    Medications Ordered in ED Medications  haloperidol  lactate (HALDOL ) injection 3 mg (has no administration in time range)  haloperidol  lactate (HALDOL ) injection 2 mg (2 mg Intramuscular Given 01/10/24 2235)    ED Course/ Medical Decision Making/ A&P                                 Medical Decision Making Amount and/or Complexity of Data Reviewed Labs: ordered. Radiology: ordered.  Risk Prescription drug management.   Patient  is alert, afebrile, and hemodynamically stable in no acute distress.  Physical exam as noted above.  Will obtain CT head and C-spine imaging, differential includes ICH, SDH, C-spine injury, amongst other diagnoses.  Given pain with left knee flexion, will obtain x-ray imaging of the left knee and hip.  Patient has ambulated while in the ED without difficulty,  which is reassuring against significant fracture.  Patient requires minimal medical workup given mechanical fall, will obtain basic labs and EKG.  I personally interpreted patient's EKG, which demonstrated atrial fibrillation with no acute ischemic changes.  Unfortunately, patient became slightly agitated and unable to tolerate CT scan.  She was given 2 mg of IM Haldol .  At the time of handoff, repeat attempted imaging is pending.  Ordered 3 mg IM Haldol  as needed.  Handoff given to Sarah Smoot PA.  See her note for remainder of workup.  Patient seen in conjunction with Dr. Isaiah Marc, who agreed with the above work-up and plan of care.         Final Clinical Impression(s) / ED Diagnoses Final diagnoses:  Fall, initial encounter    Rx / DC Orders ED Discharge Orders     None         [name], MD 01/10/24 1610    Mordecai Applebaum, MD 01/11/24 1212

## 2024-01-10 NOTE — ED Notes (Signed)
 Patient transported to CT

## 2024-01-10 NOTE — ED Notes (Signed)
 PT became aggressive in CT and had to be brought back

## 2024-01-10 NOTE — ED Triage Notes (Signed)
 PT BIB [facility] from [facility] Charolette Copier) with a c/o a witnesses fall. PT stumbled, then tripped , fell and hit her head on the floor. No LOC. PT does have a soft BP and is Covid +. No report of thinners.

## 2024-01-10 NOTE — ED Notes (Signed)
 PT had to be held down to be administered haloperidol  shot.PT then jumped out of her bed and started to walk towards the front before being talked back into her room.

## 2024-01-10 NOTE — ED Notes (Signed)
 Haloperidol  wasted with Rhoderick RN

## 2024-01-11 ENCOUNTER — Emergency Department (HOSPITAL_COMMUNITY)

## 2024-01-11 LAB — CBC WITH DIFFERENTIAL/PLATELET
Abs Immature Granulocytes: 0.03 10*3/uL (ref 0.00–0.07)
Basophils Absolute: 0 10*3/uL (ref 0.0–0.1)
Basophils Relative: 1 %
Eosinophils Absolute: 0.3 10*3/uL (ref 0.0–0.5)
Eosinophils Relative: 4 %
HCT: 40 % (ref 36.0–46.0)
Hemoglobin: 13.1 g/dL (ref 12.0–15.0)
Immature Granulocytes: 1 %
Lymphocytes Relative: 18 %
Lymphs Abs: 1.1 10*3/uL (ref 0.7–4.0)
MCH: 31.3 pg (ref 26.0–34.0)
MCHC: 32.8 g/dL (ref 30.0–36.0)
MCV: 95.7 fL (ref 80.0–100.0)
Monocytes Absolute: 0.9 10*3/uL (ref 0.1–1.0)
Monocytes Relative: 15 %
Neutro Abs: 3.9 10*3/uL (ref 1.7–7.7)
Neutrophils Relative %: 61 %
Platelets: 167 10*3/uL (ref 150–400)
RBC: 4.18 MIL/uL (ref 3.87–5.11)
RDW: 12.6 % (ref 11.5–15.5)
WBC: 6.3 10*3/uL (ref 4.0–10.5)
nRBC: 0 % (ref 0.0–0.2)

## 2024-01-11 LAB — COMPREHENSIVE METABOLIC PANEL WITH GFR
ALT: 12 U/L (ref 0–44)
AST: 19 U/L (ref 15–41)
Albumin: 3.6 g/dL (ref 3.5–5.0)
Alkaline Phosphatase: 57 U/L (ref 38–126)
Anion gap: 11 (ref 5–15)
BUN: 31 mg/dL — ABNORMAL HIGH (ref 8–23)
CO2: 24 mmol/L (ref 22–32)
Calcium: 9.5 mg/dL (ref 8.9–10.3)
Chloride: 99 mmol/L (ref 98–111)
Creatinine, Ser: 1.62 mg/dL — ABNORMAL HIGH (ref 0.44–1.00)
GFR, Estimated: 30 mL/min — ABNORMAL LOW (ref >60)
Glucose, Bld: 106 mg/dL — ABNORMAL HIGH (ref 70–99)
Potassium: 4.3 mmol/L (ref 3.5–5.1)
Sodium: 134 mmol/L — ABNORMAL LOW (ref 135–145)
Total Bilirubin: 0.9 mg/dL (ref 0.0–1.2)
Total Protein: 6.6 g/dL (ref 6.5–8.1)

## 2024-01-11 NOTE — ED Notes (Signed)
 Patient transported to CT

## 2024-01-11 NOTE — ED Notes (Signed)
 Pt refuses vitals/ PTAR arrived

## 2024-01-11 NOTE — ED Notes (Signed)
 Pt refuses vitals at this time.

## 2024-01-11 NOTE — Discharge Instructions (Addendum)
 As we discussed, your workup in the ER today was reassuring for acute findings.  Laboratory evaluation, CT, and x-ray imaging did not reveal any emergent cause of your symptoms.  It does look like your kidney function is mildly elevated.  Be sure you are maintaining adequate oral hydration at home and follow-up closely with your PCP to have this rechecked in the next few days.  Return if development of any new or worsening symptoms.

## 2024-01-11 NOTE — ED Provider Notes (Signed)
 Care assumed from [name], MD at shift change. Please see their note for further information.  Briefly: Patient with dementia presents from her facility for fall with head injury. At baseline mental status. No anticoagulation  Plan: Labs and CT are pending at shift change and will determine dispo.  Some difficulty obtaining both as she is sundowning due to dementia.  Previous provider gave Haldol  with improvement and should be going for imaging now.   Patient CT head, cervical spine, hip x-ray, and knee x-ray have all resulted and revealed no acute findings.  I personally reviewed and interpreted this imaging and agree with radiology interpretation.  Labs reveal slight bump in creatinine at 1.62 up from 8 months ago at 1.36.  Labs otherwise unremarkable.  Will recommend creatinine recheck outpatient.  Otherwise patient is stable for discharge per previous providers recommendations.  She is ambulatory with a steady gait and continues to be at her baseline mental status. Evaluation and diagnostic testing in the emergency department does not suggest an emergent condition requiring admission or immediate intervention beyond what has been performed at this time.  Plan for discharge with close PCP follow-up.  Patient is understanding and amenable with plan, educated on red flag symptoms that would prompt immediate return.  Patient discharged in stable condition.    Tiffany Black 01/11/24 0981    Kelsey Patricia, MD 01/11/24 [phone]

## 2024-01-15 ENCOUNTER — Encounter (HOSPITAL_COMMUNITY): Payer: Self-pay

## 2024-01-15 ENCOUNTER — Other Ambulatory Visit: Payer: Self-pay

## 2024-01-15 ENCOUNTER — Emergency Department (HOSPITAL_COMMUNITY)
Admission: EM | Admit: 2024-01-15 | Discharge: 2024-01-16 | Disposition: A | Discharge status: Home / Self Care | Attending: Emergency Medicine | Admitting: Emergency Medicine

## 2024-01-15 DIAGNOSIS — W050XXA Fall from non-moving wheelchair, initial encounter: Secondary | ICD-10-CM | POA: Insufficient documentation

## 2024-01-15 DIAGNOSIS — F0390 Unspecified dementia without behavioral disturbance: Secondary | ICD-10-CM | POA: Insufficient documentation

## 2024-01-15 DIAGNOSIS — Z8673 Personal history of transient ischemic attack (TIA), and cerebral infarction without residual deficits: Secondary | ICD-10-CM | POA: Insufficient documentation

## 2024-01-15 DIAGNOSIS — R531 Weakness: Secondary | ICD-10-CM | POA: Diagnosis present

## 2024-01-15 DIAGNOSIS — Z79899 Other long term (current) drug therapy: Secondary | ICD-10-CM | POA: Insufficient documentation

## 2024-01-15 DIAGNOSIS — Z91040 Latex allergy status: Secondary | ICD-10-CM | POA: Diagnosis not present

## 2024-01-15 DIAGNOSIS — Z8542 Personal history of malignant neoplasm of other parts of uterus: Secondary | ICD-10-CM | POA: Diagnosis not present

## 2024-01-15 DIAGNOSIS — N289 Disorder of kidney and ureter, unspecified: Secondary | ICD-10-CM | POA: Diagnosis not present

## 2024-01-15 DIAGNOSIS — E871 Hypo-osmolality and hyponatremia: Secondary | ICD-10-CM | POA: Diagnosis not present

## 2024-01-15 DIAGNOSIS — I10 Essential (primary) hypertension: Secondary | ICD-10-CM | POA: Diagnosis not present

## 2024-01-15 DIAGNOSIS — W19XXXA Unspecified fall, initial encounter: Secondary | ICD-10-CM

## 2024-01-15 DIAGNOSIS — Y92129 Unspecified place in nursing home as the place of occurrence of the external cause: Secondary | ICD-10-CM | POA: Diagnosis not present

## 2024-01-15 NOTE — ED Triage Notes (Addendum)
 Pt BIBA from [facility] care,  c/o unwitnessed fall. VSS. Denies pain, loc, headstrike, or blood thinners.  C/o feeling like she is going to fall when she stands. Baseline walker, has been using a wheelchair the past couple of days because she has been feeling week. Hx of dementia.

## 2024-01-16 ENCOUNTER — Emergency Department (HOSPITAL_COMMUNITY)

## 2024-01-16 LAB — URINALYSIS, W/ REFLEX TO CULTURE (INFECTION SUSPECTED)
Bilirubin Urine: NEGATIVE
Glucose, UA: NEGATIVE mg/dL
Hgb urine dipstick: NEGATIVE
Ketones, ur: NEGATIVE mg/dL
Nitrite: NEGATIVE
Protein, ur: NEGATIVE mg/dL
Specific Gravity, Urine: 1.012 (ref 1.005–1.030)
pH: 5 (ref 5.0–8.0)

## 2024-01-16 LAB — CBC WITH DIFFERENTIAL/PLATELET
Abs Immature Granulocytes: 0.03 10*3/uL (ref 0.00–0.07)
Basophils Absolute: 0.1 10*3/uL (ref 0.0–0.1)
Basophils Relative: 1 %
Eosinophils Absolute: 0.3 10*3/uL (ref 0.0–0.5)
Eosinophils Relative: 5 %
HCT: 42.3 % (ref 36.0–46.0)
Hemoglobin: 13.6 g/dL (ref 12.0–15.0)
Immature Granulocytes: 0 %
Lymphocytes Relative: 24 %
Lymphs Abs: 1.7 10*3/uL (ref 0.7–4.0)
MCH: 31.5 pg (ref 26.0–34.0)
MCHC: 32.2 g/dL (ref 30.0–36.0)
MCV: 97.9 fL (ref 80.0–100.0)
Monocytes Absolute: 0.7 10*3/uL (ref 0.1–1.0)
Monocytes Relative: 10 %
Neutro Abs: 4.2 10*3/uL (ref 1.7–7.7)
Neutrophils Relative %: 60 %
Platelets: 188 10*3/uL (ref 150–400)
RBC: 4.32 MIL/uL (ref 3.87–5.11)
RDW: 12.6 % (ref 11.5–15.5)
WBC: 6.9 10*3/uL (ref 4.0–10.5)
nRBC: 0 % (ref 0.0–0.2)

## 2024-01-16 LAB — BASIC METABOLIC PANEL WITH GFR
Anion gap: 9 (ref 5–15)
BUN: 47 mg/dL — ABNORMAL HIGH (ref 8–23)
CO2: 24 mmol/L (ref 22–32)
Calcium: 9.3 mg/dL (ref 8.9–10.3)
Chloride: 98 mmol/L (ref 98–111)
Creatinine, Ser: 1.81 mg/dL — ABNORMAL HIGH (ref 0.44–1.00)
GFR, Estimated: 27 mL/min — ABNORMAL LOW (ref >60)
Glucose, Bld: 90 mg/dL (ref 70–99)
Potassium: 4.1 mmol/L (ref 3.5–5.1)
Sodium: 131 mmol/L — ABNORMAL LOW (ref 135–145)

## 2024-01-16 MED ORDER — SODIUM CHLORIDE 0.9 % IV BOLUS
1000.0000 mL | Freq: Once | INTRAVENOUS | Status: AC
  Administered 2024-01-16: 1000 mL via INTRAVENOUS

## 2024-01-16 NOTE — ED Provider Notes (Addendum)
 Pt signed out by Dr Candelaria Chaco to check UA and d/c to SNF.  UA w 0-5 wbc. No report of dysuria/gu symptoms. No fevers.   NA is mildly low, and recent aki. NS bolus.   Pt soft compared to priors. Pt is noted to be on multiple bp meds, possibly now requires less aggressive bp control - rec close  pcp to assess/adjust meds.   Iv fluids given in ED. Pt is awake and alert, no distress. Ambulates to bathroom. No new physical c/o or pain.   Pt currently appears stable for ED d/c.   Return precautions provided.      Tiffany Lee, MD 01/16/24 1252

## 2024-01-16 NOTE — ED Provider Notes (Signed)
 [place] EMERGENCY DEPARTMENT AT [facility] Provider Note   CSN: [id number] Arrival date & time: 01/15/24  2332     History  Chief Complaint  Patient presents with   Tiffany Black Tiffany Black is a 89 y.o. female.  The history is provided by the nursing home. The history is limited by the condition of the patient (Dementia).  Fall  She has history of hypertension, hyperlipidemia, stroke, endometrial cancer, dementia following an unwitnessed fall at the memory care unit where she resides.  There is a report that she has been having increasing weakness over the last several days.   Home Medications Prior to Admission medications   Medication Sig Start Date End Date Taking? Authorizing Provider  acetaminophen  (TYLENOL ) 325 MG tablet Take 650 mg by mouth every 6 (six) hours as needed for moderate pain (pain score 4-6) or mild pain (pain score 1-3).   Yes [provider]  amLODipine  (NORVASC ) 10 MG tablet Take 1 tablet (10 mg total) by mouth daily. 02/18/23  Yes Rai, Ripudeep K, MD  bumetanide  (BUMEX ) 1 MG tablet Take 1 tablet (1 mg total) by mouth daily. 02/18/23  Yes Rai, Ripudeep K, MD  busPIRone (BUSPAR) 7.5 MG tablet Take 7.5 mg by mouth 3 (three) times daily. 01/04/24  Yes [provider]  FLUoxetine  (PROZAC ) 20 MG capsule Take 10 mg by mouth daily.   Yes [provider]  GERI-TUSSIN 100 MG/5ML liquid Take 10 mLs by mouth every 6 (six) hours as needed. 01/08/24  Yes [provider]  LORazepam  (ATIVAN ) 0.5 MG tablet Take 0.5 mg by mouth daily at 12 noon. Give one tablet by mouth daily in the afternoon for agitation. 12/24/23  Yes [provider]  LORazepam  (ATIVAN ) 0.5 MG tablet Take 0.5 mg by mouth every 8 (eight) hours as needed for anxiety.   Yes [provider]  metoprolol  tartrate (LOPRESSOR ) 25 MG tablet Take 25 mg by mouth 2 (two) times daily. 01/07/24  Yes [provider]  nystatin cream (MYCOSTATIN) Apply  1 Application topically every 12 (twelve) hours as needed (Redness and rash). Apply to under bilateral breast topically every 12 hours as needed for redness/rash 11/03/23  Yes [provider]  potassium chloride  (KLOR-CON  M) 10 MEQ tablet Take 10 mEq by mouth daily. 01/04/24  Yes [provider]  QUEtiapine  (SEROQUEL ) 50 MG tablet Take 1 tablet (50 mg total) by mouth at bedtime. 08/15/22  Yes Elgergawy, Ardia Kraft, MD  spironolactone  (ALDACTONE ) 25 MG tablet Take 1 tablet (25 mg total) by mouth daily. 02/18/23  Yes Rai, Ripudeep K, MD  valsartan  (DIOVAN ) 80 MG tablet Take 80 mg by mouth daily. 01/04/24  Yes [provider]      Allergies    Hydrochlorothiazide, Penicillins, Atorvastatin, Ciprofloxacin , Doxycycline, Hydrocodone -acetaminophen , Iodinated contrast media, Iodine-131, Lasix [furosemide], Latex, Niaspan [niacin], Statins, Toprol  xl [metoprolol ], Carbamazepine, Codeine, Nickel, Sulfa antibiotics, Sulfamethoxazole, and Sulfonamide derivatives    Review of Systems   Review of Systems  Unable to perform ROS: Dementia    Physical Exam Updated Vital Signs BP 104/88   Pulse 72   Temp 98 F (36.7 C)   Resp 17   Ht 5\' 5"  (1.651 m)   Wt 66 kg   SpO2 99%   BMI 24.21 kg/m  Physical Exam Vitals and nursing note reviewed.   89 year old female, resting comfortably and in no acute distress. Vital signs are normal. Oxygen saturation is 96%, which is normal. Head  is normocephalic and atraumatic. PERRLA, EOMI.  Neck is nontender. Back has point tenderness in the mid thoracic region, no other tenderness. Lungs are clear without rales, wheezes, or rhonchi. Chest is nontender. Heart has regular rate and rhythm without murmur. Abdomen is soft, flat, nontender. Extremities have no swelling or deformities.  Full passive range of motion is present of all joints without pain. Skin is warm and dry without rash. Neurologic: Awake and alert, oriented to person but not place or  time.  Moves all extremities equally.  ED Results / Procedures / Treatments   Labs (all labs ordered are listed, but only abnormal results are displayed) Labs Reviewed  BASIC METABOLIC PANEL WITH GFR - Abnormal; Notable for the following components:      Result Value   Sodium 131 (*)    BUN 47 (*)    Creatinine, Ser 1.81 (*)    GFR, Estimated 27 (*)    All other components within normal limits  CBC WITH DIFFERENTIAL/PLATELET  URINALYSIS, W/ REFLEX TO CULTURE (INFECTION SUSPECTED)   Radiology CT Thoracic Spine Wo Contrast Result Date: 01/16/2024 CLINICAL DATA:  Unwitnessed fall, mid back pain EXAM: CT THORACIC SPINE WITHOUT CONTRAST TECHNIQUE: Multidetector CT images of the thoracic were obtained using the standard protocol without intravenous contrast. RADIATION DOSE REDUCTION: This exam was performed according to the departmental dose-optimization program which includes automated exposure control, adjustment of the mA and/or kV according to patient size and/or use of iterative reconstruction technique. COMPARISON:  Chest radiographs 04/09/2023 FINDINGS: Alignment: No evidence of traumatic listhesis. Vertebrae: No acute fracture. Paraspinal and other soft tissues: No paraspinal soft tissue thickening or evidence of epidural hematoma. Disc levels: Multilevel spondylosis with bridging anterior osteophytes throughout the thoracic spine. No severe spinal canal narrowing. IMPRESSION: No acute fracture in the thoracic spine. Electronically Signed   By: Rozell Cornet M.D.   On: 01/16/2024 03:14   CT Head Wo Contrast Result Date: 01/16/2024 CLINICAL DATA:  Unwitnessed fall. Dizziness when standing. Weakness. History of dementia, vertigo, and hypertension. EXAM: CT HEAD WITHOUT CONTRAST CT CERVICAL SPINE WITHOUT CONTRAST TECHNIQUE: Multidetector CT imaging of the head and cervical spine was performed following the standard protocol without intravenous contrast. Multiplanar CT image reconstructions of the  cervical spine were also generated. RADIATION DOSE REDUCTION: This exam was performed according to the departmental dose-optimization program which includes automated exposure control, adjustment of the mA and/or kV according to patient size and/or use of iterative reconstruction technique. COMPARISON:  CT head and cervical spine 01/11/2024 FINDINGS: CT HEAD FINDINGS Brain: No intracranial hemorrhage, mass effect, or evidence of acute infarct. No hydrocephalus. No extra-axial fluid collection. Generalized cerebral atrophy and chronic small vessel ischemic disease. Vascular: No hyperdense vessel. Intracranial arterial calcification. Skull: No fracture or focal lesion. Sinuses/Orbits: Globes are intact. Chronic left maxillary sinusitis. Other: None. CT CERVICAL SPINE FINDINGS Alignment: No evidence of traumatic malalignment. Skull base and vertebrae: No acute fracture. No primary bone lesion or focal pathologic process. Soft tissues and spinal canal: No prevertebral fluid or swelling. No visible canal hematoma. Disc levels: Multilevel spondylosis and facet arthropathy is unchanged from prior. No severe spinal canal narrowing. Upper chest: No acute abnormality. Other: Carotid calcification. IMPRESSION: 1. No acute intracranial abnormality. 2. No acute fracture in the cervical spine. Electronically Signed   By: Rozell Cornet M.D.   On: 01/16/2024 03:08   CT Cervical Spine Wo Contrast Result Date: 01/16/2024 CLINICAL DATA:  Unwitnessed fall. Dizziness when standing. Weakness. History of dementia, vertigo, and hypertension.  EXAM: CT HEAD WITHOUT CONTRAST CT CERVICAL SPINE WITHOUT CONTRAST TECHNIQUE: Multidetector CT imaging of the head and cervical spine was performed following the standard protocol without intravenous contrast. Multiplanar CT image reconstructions of the cervical spine were also generated. RADIATION DOSE REDUCTION: This exam was performed according to the departmental dose-optimization program which  includes automated exposure control, adjustment of the mA and/or kV according to patient size and/or use of iterative reconstruction technique. COMPARISON:  CT head and cervical spine 01/11/2024 FINDINGS: CT HEAD FINDINGS Brain: No intracranial hemorrhage, mass effect, or evidence of acute infarct. No hydrocephalus. No extra-axial fluid collection. Generalized cerebral atrophy and chronic small vessel ischemic disease. Vascular: No hyperdense vessel. Intracranial arterial calcification. Skull: No fracture or focal lesion. Sinuses/Orbits: Globes are intact. Chronic left maxillary sinusitis. Other: None. CT CERVICAL SPINE FINDINGS Alignment: No evidence of traumatic malalignment. Skull base and vertebrae: No acute fracture. No primary bone lesion or focal pathologic process. Soft tissues and spinal canal: No prevertebral fluid or swelling. No visible canal hematoma. Disc levels: Multilevel spondylosis and facet arthropathy is unchanged from prior. No severe spinal canal narrowing. Upper chest: No acute abnormality. Other: Carotid calcification. IMPRESSION: 1. No acute intracranial abnormality. 2. No acute fracture in the cervical spine. Electronically Signed   By: Rozell Cornet M.D.   On: 01/16/2024 03:08    Procedures Procedures    Medications Ordered in ED Medications - No data to display  ED Course/ Medical Decision Making/ A&P                                 Medical Decision Making Amount and/or Complexity of Data Reviewed Labs: ordered. Radiology: ordered.   Unwitnessed fall with tenderness in the mid thoracic spine.  I have ordered CT scans of head, cervical spine, thoracic spine.  Report of weakness.  I have ordered urinalysis, basic metabolic panel, urinalysis.  I have reviewed her past records and note ED visit for fall on 01/10/2024 at which time labs showed creatinine slightly increased over baseline and normal CBC.  CT scans show no acute injury.  I have independently viewed all the  images, and agree with the radiologist's interpretation.  I have reviewed her laboratory tests, and my interpretation is renal insufficiency which is slightly worse than recent value, hyponatremia which is not felt to be clinically significant, normal CBC.  Urinalysis is pending.  Case is signed out to Dr. Lugene Sahara.  Final Clinical Impression(s) / ED Diagnoses Final diagnoses:  Fall at nursing home, initial encounter  Renal insufficiency  Hyponatremia    Rx / DC Orders ED Discharge Orders     None         Alissa April, MD 01/16/24 [phone]

## 2024-01-16 NOTE — ED Notes (Signed)
 [facility] called

## 2024-01-16 NOTE — ED Notes (Signed)
 Pt moved the hat out the way and did not collect urine sample.

## 2024-01-16 NOTE — ED Notes (Signed)
 Patient transported to CT

## 2024-01-16 NOTE — ED Notes (Signed)
 [facility] didn't answer 3rd attempt

## 2024-01-16 NOTE — ED Notes (Signed)
 Tried calling [employer] care

## 2024-01-16 NOTE — ED Notes (Signed)
 Tried calling report [employer]

## 2024-01-16 NOTE — ED Notes (Signed)
 IV attempt x 1 unsuccessful. ?

## 2024-01-16 NOTE — ED Notes (Addendum)
 Pt confused and asking why she is in an office. IV found in the floor under bed. Pt isn't bleeding at IV site.

## 2024-01-16 NOTE — ED Notes (Addendum)
 Attempted to get urine sample from Pt. Pt unable to void at this time. Pt aware urine sample needed.

## 2024-01-16 NOTE — Discharge Instructions (Addendum)
 It was our pleasure to provide your ER care today - we hope that you feel better. Drink plenty of fluids/stay well hydrated.   Fall precautions - use walker, and/or assistance when up and about to help decrease change of falling.   Follow up closely with primary care doctor in the next few days. Also have them recheck your blood pressure and evaluate whether or not you still need all of your medications, including your blood pressure medications. Also have them recheck your sodium level as it is low today, as well as your kidney function tests are were also elevated.   Return to ER if worse, new symptoms, fevers, new/severe pain, chest pain, trouble breathing, fainting, or other concern.

## 2024-02-07 NOTE — Progress Notes (Unsigned)
 Cardiology Office Note Date:  02/07/2024  Patient ID:  Tiffany Black, Schwalb [birth year], MRN [id number] PCP:  Jeannine Milroy., MD  Electrophysiologist: Dr. Rodolfo Clan    Chief Complaint:  *** hospital f/u  History of Present Illness: Tiffany Black is a 89 y.o. female with history of  permanent AFib,  dementia, TIA, CKD (III), orthostatic hypotension, anxiety  Being seen by Dr. Rodolfo Clan and EP APPs----------------------  She saw Dr. Bernetta Brilliant 07/13/22, her dementia progressing and medication compliance increasingly challenging and had declined/refused her son's (who she lives with) attempts to help with this.  Dr. Bernetta Brilliant reached out to make us  aware of this.  Asked that we managed her BP and try to assure hat she is compliant with meds, particularly her Xarelto .  I saw her 07/14/22 She is initially alone in the room, but agreeable to have her son come in to help the visit. She admits today that her memory is not what it used to be and agreeable to have her son help with filling her pill box. She brings her medicines today and her pill boxes. She denies any symptoms, no CP Does not recall her EMS/ER trip No reports of syncope by either of them. No SOB No bleeding She brings a written list of medicines from 2021, that does not align with pill bottles. She has 2 bottles nearly full of fluoxetine , and pill boxes do not have any in them. She doesn't know why, but does not appear to be taking it. Her valsartan  bottle is empty, her son says that there are 2 prescriptions ready for them to pick up on the way home.  He believes that is one of them. Her morning pill box does not have any metoprolol  in it as far as I can tell. Not sure if there is any valsartan  PM box does have xarelto  in it Pt was agreeable to have her son help out with her pills/pill box, planned to have her back in a few months to f/u.  Numerous ER visits and admissions  Admitted 08/09/22 w/AMS and transient speech  changes, fallnegative neuro w/u, started on seroquel  Discharged 08/15/22 to SNF  Admitted 08/26/22 SOB, COVID + Discharged 08/29/22  Admitted 09/27/22 admitted poor oral intake, fall, large effusion, PNA, had thora (hemothorax), rib fx,  Discharged 10/03/22  Admitted 10/20/22, fall (unwitness, found on floor), , recurrent R effusion, thora, worsening impulsiveness,  and memory Discharged 10/24/22  ER 11/18/22, lightheaded/weak, discharged, no particular diagnosis, given IVF  ER 11/20/22, dizziness, BP elevated 176/101, discharged same day  I saw her 11/22/22 She is accompanied by her son, lives with him home now, he mentions her mood was much worse at the SNF. She reports feeling well, that being said surprised to hear that she had been in/out of the hospital with no recollections of them. She denies CP, SOB Sin agrees, she does not seem to c/o that at home Dizziness has been the most persistent symptoms, in his best assessment based on what she says/how she looks, is more of an off balance , she has not mentioned or looked near faint. Her son says that mostly things are better at home as far as medication compliance and letting him help her. She has been to the pulmonologist, had another out patient thora, and advised to continue to hold her Eliquis for now at least that she may require a "tube" be placed He has plans to reach out to Dr. Diania Fortes soon, they don't  have an appt currently. His son isn't certain, but thinks diuretics were mentioned but not felt would be helpful  Pending pulm for recs on OAC HTN > lopressor  increased    Past Medical History:  Diagnosis Date   Anxiety    Arthritis    BPPV (benign paroxysmal positional vertigo)    Breast lesion    left   Bulging of cervical intervertebral disc    Cardiomegaly    Cataracts, bilateral    Chronic neck pain    CVA (cerebral vascular accident) (HCC)    x2 2013 and 2015; right temporal lobe and left frontal lobe infarct-> dzziness  which resolved; occluded right PCA, moderate posterior left cerebral stenosis, left MCA stenosis on MRA   Dementia (HCC)    Essential hypertension    History of endometrial cancer    History of stroke    2015 and 2016 // carotid US  8/16: Bilateral 1-39 // carotid US  2/16: Bilateral ICA 1-39   HLD (hyperlipidemia)    Hx of echocardiogram    a. Echo (11/15): Mild LVH, EF 55-60%, normal wall motion, grade 2 diastolic dysfunction, mild LAE, normal RV function, PASP 29 mm Hg // echo 8/16:Mild concentric LVH, EF 55-60, normal wall motion, grade 2 diastolic dysfunction, mild LAE, PASP 39   Hypertension    IBS (irritable bowel syndrome)    Insomnia    Lung nodule    OA (osteoarthritis)    Pectus excavatum    Persistent atrial fibrillation (HCC)    Pleural effusion    right recurrent, 1/24 s/p thoracentesis x 3 (cytology negative)- Dr Diania Fortes   Pneumonia due to COVID-19 virus    Radius fracture    left distal   Vertigo    Vitamin D deficiency     Past Surgical History:  Procedure Laterality Date   ABDOMINAL HYSTERECTOMY  2011   CATARACT EXTRACTION, BILATERAL Bilateral 2009   CESAREAN SECTION  1975   IR THORACENTESIS ASP PLEURAL SPACE W/IMG GUIDE  09/28/2022   IR THORACENTESIS ASP PLEURAL SPACE W/IMG GUIDE  10/23/2022   LAPAROSCOPIC HYSTERECTOMY  2011   THORACENTESIS Right 11/03/2022   Procedure: THORACENTESIS;  Surgeon: Gloriajean Large, MD;  Location: [facility] ENDOSCOPY;  Service: Pulmonary;  Laterality: Right;    Current Outpatient Medications  Medication Sig Dispense Refill   acetaminophen  (TYLENOL ) 325 MG tablet Take 650 mg by mouth every 6 (six) hours as needed for moderate pain (pain score 4-6) or mild pain (pain score 1-3).     amLODipine  (NORVASC ) 10 MG tablet Take 1 tablet (10 mg total) by mouth daily.     bumetanide  (BUMEX ) 1 MG tablet Take 1 tablet (1 mg total) by mouth daily.     busPIRone (BUSPAR) 7.5 MG tablet Take 7.5 mg by mouth 3 (three) times daily.     FLUoxetine  (PROZAC )  20 MG capsule Take 10 mg by mouth daily.     GERI-TUSSIN 100 MG/5ML liquid Take 10 mLs by mouth every 6 (six) hours as needed.     LORazepam  (ATIVAN ) 0.5 MG tablet Take 0.5 mg by mouth daily at 12 noon. Give one tablet by mouth daily in the afternoon for agitation.     LORazepam  (ATIVAN ) 0.5 MG tablet Take 0.5 mg by mouth every 8 (eight) hours as needed for anxiety.     metoprolol  tartrate (LOPRESSOR ) 25 MG tablet Take 25 mg by mouth 2 (two) times daily.     nystatin cream (MYCOSTATIN) Apply 1 Application topically every 12 (twelve) hours as  needed (Redness and rash). Apply to under bilateral breast topically every 12 hours as needed for redness/rash     potassium chloride  (KLOR-CON  M) 10 MEQ tablet Take 10 mEq by mouth daily.     QUEtiapine  (SEROQUEL ) 50 MG tablet Take 1 tablet (50 mg total) by mouth at bedtime.     spironolactone  (ALDACTONE ) 25 MG tablet Take 1 tablet (25 mg total) by mouth daily.     valsartan  (DIOVAN ) 80 MG tablet Take 80 mg by mouth daily.     No current facility-administered medications for this visit.    Allergies:   Hydrochlorothiazide, Penicillins, Atorvastatin, Ciprofloxacin , Doxycycline, Hydrocodone -acetaminophen , Iodinated contrast media, Iodine-131, Lasix [furosemide], Latex, Niaspan [niacin], Statins, Toprol  xl [metoprolol ], Carbamazepine, Codeine, Nickel, Sulfa antibiotics, Sulfamethoxazole, and Sulfonamide derivatives   Social History:  The patient  reports that she has never smoked. She has never used smokeless tobacco. She reports that she does not drink alcohol  and does not use drugs.   Family History:  The patient's family history includes CAD in her brother; Dementia in her sister; Heart attack in her father; Heart disease in her father; Stroke in her maternal uncle and mother.  ROS:  Please see the history of present illness.    All other systems are reviewed and otherwise negative.   PHYSICAL EXAM:  VS:  There were no vitals taken for this visit. BMI:  There is no height or weight on file to calculate BMI. Well nourished, well developed, in no acute distress HEENT: normocephalic, atraumatic Neck: no JVD, carotid bruits or masses Cardiac:*** irreg-irreg; no significant murmurs, no rubs, or gallops Lungs: *** diminished R base, otherwise CTA no wheezing, rhonchi or rales Abd: soft, nontender MS: no deformity or atrophy Ext: *** trace if any edema Skin: warm and dry, no rash Neuro:  No gross deficits appreciated Psych: euthymic mood, full affect    EKG:  not done today  04/10/2017: TTE Study Conclusions  - Procedure narrative: Transthoracic echocardiography. Image    quality was adequate. Intravenous contrast (Definity ) was    administered.  - Left ventricle: The cavity size was normal. Wall thickness was    increased in a pattern of mild LVH. Systolic function was normal.    The estimated ejection fraction was in the range of 55% to 60%.    Wall motion was normal; there were no regional wall motion    abnormalities.  - Mitral valve: Calcified annulus.  - Left atrium: The atrium was mildly dilated.  - Right atrium: The atrium was mildly dilated.  - Pulmonary arteries: Systolic pressure was mildly increased. PA    peak pressure: 40 mm Hg (S).   Impressions:  - Definity  used; normal LV systolic function; mild LVH; mild    biatrial enlargement; mild TR with mildly elevated pulmonary    pressure.    Recent Labs: 02/15/2023: B Natriuretic Peptide 476.8; TSH 2.092 01/10/2024: ALT 12 01/16/2024: BUN 47; Creatinine, Ser 1.81; Hemoglobin 13.6; Platelets 188; Potassium 4.1; Sodium 131  No results found for requested labs within last 365 days.   CrCl cannot be calculated (Patient's most recent lab result is older than the maximum 21 days allowed.).   Wt Readings from Last 3 Encounters:  01/15/24 145 lb 8.1 oz (66 kg)  01/10/24 145 lb 8.1 oz (66 kg)  08/16/23 145 lb 9.6 oz (66 kg)     Other studies reviewed: Additional  studies/records reviewed today include: summarized above  ASSESSMENT AND PLAN:  Permanent Afib  CHA2DS2Vasc is 7, on ***, ***  appropriately dosed *** rate controlled    HTN  w/hx of orthostatic dizziness and hypotension also mentioned in her hx ***    3. Secondary hypercoagulable state   4. Recurrent pleural effusions S/p a couple thoras now Unclear etiology *** Following with pulmonary    Disposition: b***sooner if needed     Current medicines are reviewed at length with the patient today.  The patient did not have any concerns regarding medicines.  [place], PA-C 02/07/2024 12:59 PM     [facility] [address] [place] [address] [phone] (office)  [phone] (fax)

## 2024-02-08 ENCOUNTER — Ambulatory Visit: Attending: Physician Assistant | Admitting: Physician Assistant

## 2024-02-08 ENCOUNTER — Encounter: Payer: Self-pay | Admitting: Physician Assistant

## 2024-02-08 VITALS — BP 100/70 | HR 98 | Ht 68.0 in | Wt 165.0 lb

## 2024-02-08 DIAGNOSIS — I4821 Permanent atrial fibrillation: Secondary | ICD-10-CM | POA: Diagnosis not present

## 2024-02-08 DIAGNOSIS — R296 Repeated falls: Secondary | ICD-10-CM

## 2024-02-08 DIAGNOSIS — I10 Essential (primary) hypertension: Secondary | ICD-10-CM | POA: Diagnosis not present

## 2024-02-08 NOTE — Patient Instructions (Signed)
 Medication Instructions:   Your physician recommends that you continue on your current medications as directed. Please refer to the Current Medication list given to you today.  *If you need a refill on your cardiac medications before your next appointment, please call your pharmacy*   Lab Work: NONE ORDERED  TODAY    If you have labs (blood work) drawn today and your tests are completely normal, you will receive your results only by: MyChart Message (if you have MyChart) OR A paper copy in the mail If you have any lab test that is abnormal or we need to change your treatment, we will call you to review the results.    Testing/Procedures: NONE ORDERED  TODAY      Follow-Up:  At [facility], you and your health needs are our priority.  As part of our continuing mission to provide you with exceptional heart care, our providers are all part of one team.  This team includes your primary Cardiologist (physician) and Advanced Practice Providers or APPs (Physician Assistants and Nurse Practitioners) who all work together to provide you with the care you need, when you need it.   Your next appointment:     Provider:  CONTACT [facility] [phone] AS NEEDED FOR  ANY CARDIAC RELATED SYMPTOMS    We recommend signing up for the patient portal called "MyChart".  Sign up information is provided on this After Visit Summary.  MyChart is used to connect with patients for Virtual Visits (Telemedicine).  Patients are able to view lab/test results, encounter notes, upcoming appointments, etc.  Non-urgent messages can be sent to your provider as well.   To learn more about what you can do with MyChart, go to [email or web address].   Other Instructions

## 2024-05-04 ENCOUNTER — Encounter (HOSPITAL_COMMUNITY): Payer: Self-pay

## 2024-05-04 ENCOUNTER — Other Ambulatory Visit: Payer: Self-pay

## 2024-05-04 ENCOUNTER — Emergency Department (HOSPITAL_COMMUNITY)

## 2024-05-04 ENCOUNTER — Inpatient Hospital Stay (HOSPITAL_COMMUNITY)
Admission: EM | Admit: 2024-05-04 | Discharge: 2024-05-14 | DRG: 481 | Disposition: A | Discharge status: Skilled Nursing Facility | Source: Skilled Nursing Facility | Attending: Internal Medicine | Admitting: Internal Medicine

## 2024-05-04 DIAGNOSIS — Z7982 Long term (current) use of aspirin: Secondary | ICD-10-CM

## 2024-05-04 DIAGNOSIS — S72141A Displaced intertrochanteric fracture of right femur, initial encounter for closed fracture: Secondary | ICD-10-CM | POA: Diagnosis present

## 2024-05-04 DIAGNOSIS — W1830XA Fall on same level, unspecified, initial encounter: Secondary | ICD-10-CM | POA: Diagnosis present

## 2024-05-04 DIAGNOSIS — Z8249 Family history of ischemic heart disease and other diseases of the circulatory system: Secondary | ICD-10-CM

## 2024-05-04 DIAGNOSIS — I4821 Permanent atrial fibrillation: Secondary | ICD-10-CM | POA: Diagnosis present

## 2024-05-04 DIAGNOSIS — Z882 Allergy status to sulfonamides status: Secondary | ICD-10-CM | POA: Diagnosis not present

## 2024-05-04 DIAGNOSIS — S52571A Other intraarticular fracture of lower end of right radius, initial encounter for closed fracture: Secondary | ICD-10-CM | POA: Diagnosis present

## 2024-05-04 DIAGNOSIS — K5900 Constipation, unspecified: Secondary | ICD-10-CM | POA: Diagnosis present

## 2024-05-04 DIAGNOSIS — Z823 Family history of stroke: Secondary | ICD-10-CM

## 2024-05-04 DIAGNOSIS — S72001A Fracture of unspecified part of neck of right femur, initial encounter for closed fracture: Secondary | ICD-10-CM | POA: Diagnosis present

## 2024-05-04 DIAGNOSIS — F03918 Unspecified dementia, unspecified severity, with other behavioral disturbance: Secondary | ICD-10-CM | POA: Diagnosis present

## 2024-05-04 DIAGNOSIS — Z79899 Other long term (current) drug therapy: Secondary | ICD-10-CM

## 2024-05-04 DIAGNOSIS — Y92099 Unspecified place in other non-institutional residence as the place of occurrence of the external cause: Secondary | ICD-10-CM | POA: Diagnosis not present

## 2024-05-04 DIAGNOSIS — Z8673 Personal history of transient ischemic attack (TIA), and cerebral infarction without residual deficits: Secondary | ICD-10-CM | POA: Diagnosis not present

## 2024-05-04 DIAGNOSIS — S52501A Unspecified fracture of the lower end of right radius, initial encounter for closed fracture: Secondary | ICD-10-CM | POA: Diagnosis not present

## 2024-05-04 DIAGNOSIS — Z885 Allergy status to narcotic agent status: Secondary | ICD-10-CM

## 2024-05-04 DIAGNOSIS — N1832 Chronic kidney disease, stage 3b: Secondary | ICD-10-CM | POA: Diagnosis present

## 2024-05-04 DIAGNOSIS — F419 Anxiety disorder, unspecified: Secondary | ICD-10-CM | POA: Diagnosis present

## 2024-05-04 DIAGNOSIS — K589 Irritable bowel syndrome without diarrhea: Secondary | ICD-10-CM | POA: Diagnosis present

## 2024-05-04 DIAGNOSIS — F0394 Unspecified dementia, unspecified severity, with anxiety: Secondary | ICD-10-CM | POA: Diagnosis present

## 2024-05-04 DIAGNOSIS — Z91040 Latex allergy status: Secondary | ICD-10-CM

## 2024-05-04 DIAGNOSIS — Z880 Allergy status to penicillin: Secondary | ICD-10-CM

## 2024-05-04 DIAGNOSIS — S52601A Unspecified fracture of lower end of right ulna, initial encounter for closed fracture: Secondary | ICD-10-CM | POA: Diagnosis present

## 2024-05-04 DIAGNOSIS — I4819 Other persistent atrial fibrillation: Secondary | ICD-10-CM | POA: Diagnosis not present

## 2024-05-04 DIAGNOSIS — E785 Hyperlipidemia, unspecified: Secondary | ICD-10-CM | POA: Diagnosis present

## 2024-05-04 DIAGNOSIS — G8929 Other chronic pain: Secondary | ICD-10-CM | POA: Diagnosis present

## 2024-05-04 DIAGNOSIS — E875 Hyperkalemia: Secondary | ICD-10-CM | POA: Diagnosis not present

## 2024-05-04 DIAGNOSIS — Z888 Allergy status to other drugs, medicaments and biological substances status: Secondary | ICD-10-CM

## 2024-05-04 DIAGNOSIS — Z8542 Personal history of malignant neoplasm of other parts of uterus: Secondary | ICD-10-CM | POA: Diagnosis not present

## 2024-05-04 DIAGNOSIS — I130 Hypertensive heart and chronic kidney disease with heart failure and stage 1 through stage 4 chronic kidney disease, or unspecified chronic kidney disease: Secondary | ICD-10-CM | POA: Diagnosis present

## 2024-05-04 DIAGNOSIS — Z751 Person awaiting admission to adequate facility elsewhere: Secondary | ICD-10-CM

## 2024-05-04 DIAGNOSIS — Z886 Allergy status to analgesic agent status: Secondary | ICD-10-CM

## 2024-05-04 DIAGNOSIS — Z91048 Other nonmedicinal substance allergy status: Secondary | ICD-10-CM

## 2024-05-04 DIAGNOSIS — Z91041 Radiographic dye allergy status: Secondary | ICD-10-CM

## 2024-05-04 DIAGNOSIS — I5032 Chronic diastolic (congestive) heart failure: Secondary | ICD-10-CM | POA: Diagnosis present

## 2024-05-04 DIAGNOSIS — N179 Acute kidney failure, unspecified: Secondary | ICD-10-CM | POA: Diagnosis present

## 2024-05-04 DIAGNOSIS — I5033 Acute on chronic diastolic (congestive) heart failure: Secondary | ICD-10-CM | POA: Diagnosis not present

## 2024-05-04 DIAGNOSIS — D62 Acute posthemorrhagic anemia: Secondary | ICD-10-CM | POA: Diagnosis not present

## 2024-05-04 DIAGNOSIS — W19XXXA Unspecified fall, initial encounter: Principal | ICD-10-CM

## 2024-05-04 DIAGNOSIS — Z9109 Other allergy status, other than to drugs and biological substances: Secondary | ICD-10-CM

## 2024-05-04 LAB — CBC WITH DIFFERENTIAL/PLATELET
Abs Immature Granulocytes: 0.06 K/uL (ref 0.00–0.07)
Basophils Absolute: 0 K/uL (ref 0.0–0.1)
Basophils Relative: 1 %
Eosinophils Absolute: 0.3 K/uL (ref 0.0–0.5)
Eosinophils Relative: 4 %
HCT: 40.7 % (ref 36.0–46.0)
Hemoglobin: 13.4 g/dL (ref 12.0–15.0)
Immature Granulocytes: 1 %
Lymphocytes Relative: 14 %
Lymphs Abs: 1.1 K/uL (ref 0.7–4.0)
MCH: 31.4 pg (ref 26.0–34.0)
MCHC: 32.9 g/dL (ref 30.0–36.0)
MCV: 95.3 fL (ref 80.0–100.0)
Monocytes Absolute: 0.6 K/uL (ref 0.1–1.0)
Monocytes Relative: 8 %
Neutro Abs: 6 K/uL (ref 1.7–7.7)
Neutrophils Relative %: 72 %
Platelets: 224 K/uL (ref 150–400)
RBC: 4.27 MIL/uL (ref 3.87–5.11)
RDW: 12.8 % (ref 11.5–15.5)
WBC: 8.2 K/uL (ref 4.0–10.5)
nRBC: 0 % (ref 0.0–0.2)

## 2024-05-04 LAB — BASIC METABOLIC PANEL WITH GFR
Anion gap: 15 (ref 5–15)
BUN: 40 mg/dL — ABNORMAL HIGH (ref 8–23)
CO2: 19 mmol/L — ABNORMAL LOW (ref 22–32)
Calcium: 9.9 mg/dL (ref 8.9–10.3)
Chloride: 100 mmol/L (ref 98–111)
Creatinine, Ser: 2.09 mg/dL — ABNORMAL HIGH (ref 0.44–1.00)
GFR, Estimated: 22 mL/min — ABNORMAL LOW (ref >60)
Glucose, Bld: 145 mg/dL — ABNORMAL HIGH (ref 70–99)
Potassium: 4.6 mmol/L (ref 3.5–5.1)
Sodium: 134 mmol/L — ABNORMAL LOW (ref 135–145)

## 2024-05-04 MED ORDER — FENTANYL CITRATE PF 50 MCG/ML IJ SOSY
50.0000 ug | PREFILLED_SYRINGE | Freq: Once | INTRAMUSCULAR | Status: AC
  Administered 2024-05-04: 50 ug via INTRAVENOUS
  Filled 2024-05-04: qty 1

## 2024-05-04 MED ORDER — ONDANSETRON HCL 4 MG/2ML IJ SOLN
4.0000 mg | Freq: Once | INTRAMUSCULAR | Status: AC
  Administered 2024-05-04: 4 mg via INTRAVENOUS
  Filled 2024-05-04: qty 2

## 2024-05-04 MED ORDER — MORPHINE SULFATE (PF) 4 MG/ML IV SOLN
4.0000 mg | Freq: Once | INTRAVENOUS | Status: AC
  Administered 2024-05-04: 4 mg via INTRAVENOUS
  Filled 2024-05-04: qty 1

## 2024-05-04 NOTE — Progress Notes (Addendum)
 Orthopedic Tech Progress Note Patient Details:  Tiffany Black [birth year] [id number]  Ortho Devices Type of Ortho Device: Sugartong splint Ortho Device/Splint Location: rue Ortho Device/Splint Interventions: Ordered, Application, Adjustment  The dr said no arm sling needed. Post Interventions Patient Tolerated: Well Instructions Provided: Care of device, Adjustment of device  [name] PARAS 05/04/2024, 11:21 PM

## 2024-05-04 NOTE — ED Triage Notes (Signed)
 Patient is coming from [facility]. Unwitnessed fall, baseline A&Ox2. Right wrist and leg pain. Noted shortening of right leg and swelling of right wrist. Positive pulses in all limbs. No thinners EMS VS 140/80 BP 90 HR 94% RA

## 2024-05-04 NOTE — ED Provider Notes (Signed)
 [facility] AT [facility] Provider Note   CSN: [id number] Arrival date & time: 05/04/24  2007     Patient presents with: Tiffany Black is a 89 y.o. female.   Patient is an 89 year old female with a history of hypertension, cardiomegaly, persistent atrial fibrillation not on any anticoagulation, dementia who lives in an memory care unit who is presenting today after a fall.  Facility reports that she was fine all day and they saw her right before shift change and she was okay and then they went to check on her after shift change and she had fallen.  She was awake and alert but complaining of significant pain in her arm and her leg.  Unclear why she fell but EMS reports she was able to pivot for them.  She complains of pain in her leg and her arm.  They denied any head injury patient denies any headache or neck pain.  No recent medication changes in facility denied any recent decreased oral intake, nausea vomiting or other concerns.  The history is provided by the patient, the nursing home and medical records.  Fall       Prior to Admission medications   Medication Sig Start Date End Date Taking? Authorizing Provider  acetaminophen  (TYLENOL ) 325 MG tablet Take 650 mg by mouth every 6 (six) hours as needed for moderate pain (pain score 4-6) or mild pain (pain score 1-3).    [provider]  amLODipine  (NORVASC ) 10 MG tablet Take 1 tablet (10 mg total) by mouth daily. 02/18/23   Rai, Ripudeep K, MD  bumetanide  (BUMEX ) 1 MG tablet Take 1 tablet (1 mg total) by mouth daily. 02/18/23   Rai, Ripudeep K, MD  busPIRone  (BUSPAR ) 7.5 MG tablet Take 7.5 mg by mouth 3 (three) times daily. 01/04/24   [provider]  FLUoxetine  (PROZAC ) 20 MG capsule Take 10 mg by mouth daily.    [provider]  GERI-TUSSIN 100 MG/5ML liquid Take 10 mLs by mouth every 6 (six) hours as needed. 01/08/24   [provider]  LORazepam  (ATIVAN ) 0.5  MG tablet Take 0.5 mg by mouth daily at 12 noon. Give one tablet by mouth daily in the afternoon for agitation. 12/24/23   [provider]  LORazepam  (ATIVAN ) 0.5 MG tablet Take 0.5 mg by mouth every 8 (eight) hours as needed for anxiety.    [provider]  metoprolol  tartrate (LOPRESSOR ) 25 MG tablet Take 25 mg by mouth 2 (two) times daily. 01/07/24   [provider]  nystatin cream (MYCOSTATIN) Apply 1 Application topically every 12 (twelve) hours as needed (Redness and rash). Apply to under bilateral breast topically every 12 hours as needed for redness/rash 11/03/23   [provider]  potassium chloride  (KLOR-CON  M) 10 MEQ tablet Take 10 mEq by mouth daily. 01/04/24   [provider]  QUEtiapine  (SEROQUEL ) 50 MG tablet Take 1 tablet (50 mg total) by mouth at bedtime. 08/15/22   Elgergawy, Brayton RAMAN, MD  spironolactone  (ALDACTONE ) 25 MG tablet Take 1 tablet (25 mg total) by mouth daily. 02/18/23   Rai, Nydia POUR, MD  valsartan  (DIOVAN ) 80 MG tablet Take 80 mg by mouth daily. 01/04/24   [provider]    Allergies: Hydrochlorothiazide, Penicillins, Atorvastatin, Ciprofloxacin , Doxycycline, Hydrocodone -acetaminophen , Iodinated contrast media, Iodine-131, Lasix [furosemide], Latex, Niaspan [niacin], Statins, Toprol  xl [metoprolol ], Carbamazepine, Codeine, Nickel, Sulfa antibiotics, Sulfamethoxazole, and Sulfonamide derivatives    Review of Systems  Updated  Vital Signs BP 122/73   Pulse 62   Resp 19   SpO2 100%   Physical Exam Vitals and nursing note reviewed.  Constitutional:      General: She is not in acute distress.    Appearance: She is well-developed.  HENT:     Head: Normocephalic and atraumatic.  Eyes:     Pupils: Pupils are equal, round, and reactive to light.     Comments: Pupils are 4 mm and sluggishly reactive bilaterally  Cardiovascular:     Rate and Rhythm: Normal rate and regular rhythm.     Pulses: Normal pulses.      Heart sounds: Normal heart sounds. No murmur heard.    No friction rub.  Pulmonary:     Effort: Pulmonary effort is normal.     Breath sounds: Normal breath sounds. No wheezing or rales.  Abdominal:     General: Bowel sounds are normal. There is no distension.     Palpations: Abdomen is soft.     Tenderness: There is no abdominal tenderness. There is no guarding or rebound.  Musculoskeletal:        General: Tenderness, deformity and signs of injury present.     Right shoulder: Tenderness present. Normal range of motion.     Right wrist: Deformity, tenderness and bony tenderness present. Decreased range of motion. Normal pulse.     Cervical back: Normal range of motion and neck supple. No tenderness.     Right hip: Tenderness and bony tenderness present. Decreased range of motion.     Comments: No edema.  Normal sensation in the right finger and hand.  Right leg is shortened  Skin:    General: Skin is warm and dry.     Findings: No rash.  Neurological:     Mental Status: She is alert.     Cranial Nerves: No cranial nerve deficit.     Comments: Oriented to person and place.  Awake and able to participate with exam  Psychiatric:        Behavior: Behavior normal.     (all labs ordered are listed, but only abnormal results are displayed) Labs Reviewed  BASIC METABOLIC PANEL WITH GFR - Abnormal; Notable for the following components:      Result Value   Sodium 134 (*)    CO2 19 (*)    Glucose, Bld 145 (*)    BUN 40 (*)    Creatinine, Ser 2.09 (*)    GFR, Estimated 22 (*)    All other components within normal limits  CBC WITH DIFFERENTIAL/PLATELET    EKG: EKG Interpretation Date/Time:  Sunday May 04 2024 20:18:16 EDT Ventricular Rate:  97 PR Interval:    QRS Duration:  98 QT Interval:  358 QTC Calculation: 455 R Axis:   -57  Text Interpretation: Atrial fibrillation Left anterior fascicular block Abnormal T, consider ischemia, lateral leads Artifact in lead(s) I III aVR  aVL No significant change since last tracing Confirmed by Doretha Folks ([id number]) on 05/04/2024 9:31:07 PM  Radiology: ARCOLA Wrist Complete Right Result Date: 05/04/2024 CLINICAL DATA:  pain after fall EXAM: RIGHT WRIST - COMPLETE 3+ VIEW COMPARISON:  None Available. FINDINGS: Diffusely decreased bone density. Acute intra-articular, comminuted, and displaced distal radial metadiaphysis fracture. Question anterior dislocation of the radius in relation to the carpal bones. Acute displaced and comminuted distal ulnar fracture. Scaphoid degenerative changes. Subcutaneus soft tissue edema. IMPRESSION: 1. Acute intra-articular, comminuted, and displaced distal radial metadiaphysis fracture. 2. Acute displaced and  comminuted distal ulnar fracture. 3. Question anterior dislocation of the radius in relation to the carpal bones. Electronically Signed   By: Morgane  Naveau M.D.   On: 05/04/2024 21:15   DG Tibia/Fibula Right Result Date: 05/04/2024 CLINICAL DATA:  pain after fall EXAM: RIGHT TIBIA AND FIBULA - 2 VIEW COMPARISON:  None Available. FINDINGS: There is no evidence of fracture or other focal bone lesions. Knee and ankle grossly unremarkable. Soft tissues are unremarkable. Vascular calcification. IMPRESSION: Negative for acute traumatic injury. Electronically Signed   By: Morgane  Naveau M.D.   On: 05/04/2024 21:13   DG Humerus Right Result Date: 05/04/2024 CLINICAL DATA:  pain after fall EXAM: RIGHT HUMERUS - 2+ VIEW COMPARISON:  None Available. FINDINGS: There is no evidence of fracture or other focal bone lesions. Soft tissues are unremarkable. IMPRESSION: Negative. Electronically Signed   By: Morgane  Naveau M.D.   On: 05/04/2024 21:12   DG Hip Unilat W or Wo Pelvis 2-3 Views Right Result Date: 05/04/2024 CLINICAL DATA:  pain after fall EXAM: DG HIP (WITH OR WITHOUT PELVIS) 2-3V RIGHT COMPARISON:  None Available. FINDINGS: Acute displaced and comminuted right femoral intertrochanteric fracture. No right  hip dislocation. No acute displaced fracture or dislocation of left hip. No acute displaced fracture or diastasis of the bones of the pelvis. There is no evidence of arthropathy or other focal bone abnormality. IMPRESSION: Acute displaced and comminuted right femoral intertrochanteric fracture. Electronically Signed   By: Morgane  Naveau M.D.   On: 05/04/2024 21:12   DG Chest Port 1 View Result Date: 05/04/2024 CLINICAL DATA:  pain after fall EXAM: PORTABLE CHEST 1 VIEW COMPARISON:  Chest x-ray 05/01/2023 FINDINGS: The heart and mediastinal contours are unchanged. Atherosclerotic plaque. No focal consolidation. No pulmonary edema. No pleural effusion. No pneumothorax. No acute osseous abnormality. IMPRESSION: 1. No active disease. 2.  Aortic Atherosclerosis (ICD10-I70.0). Electronically Signed   By: Morgane  Naveau M.D.   On: 05/04/2024 21:10     Procedures   Medications Ordered in the ED  ondansetron  (ZOFRAN ) injection 4 mg (4 mg Intravenous Given 05/04/24 2028)  fentaNYL  (SUBLIMAZE ) injection 50 mcg (50 mcg Intravenous Given 05/04/24 2028)  morphine  (PF) 4 MG/ML injection 4 mg (4 mg Intravenous Given 05/04/24 2133)                                    Medical Decision Making Amount and/or Complexity of Data Reviewed Independent Historian: EMS External Data Reviewed: notes. Labs: ordered. Decision-making details documented in ED Course. Radiology: ordered and independent interpretation performed. Decision-making details documented in ED Course. ECG/medicine tests: ordered and independent interpretation performed. Decision-making details documented in ED Course.  Risk Prescription drug management. Decision regarding hospitalization.   Pt with multiple medical problems and comorbidities and presenting today with a complaint that caries a high risk for morbidity and mortality.  Here today with the above complaints.  Concern for fracture of the wrist and hip.  Patient does not take any  anticoagulation and has no evidence of injury to the head and denies any neck pain.  She denies any chest pain or shortness of breath.  She has been hemodynamically stable here.  Or independently interpreted patient's EKG and labs and CBC within normal limits, BMP with worsening renal function now with creatinine of 2.09 from 1.8 in May. I have independently visualized and interpreted pt's images today. Chest x-ray without acute findings, humerus without evidence of  fracture or issue with the shoulder, right wrist image with both bone fracture.  Radiology reports acute intra-articular comminuted and displaced distal radial fracture as well as displaced and comminuted ulnar fracture with a questionable anterior dislocation of the radius from the carpal bones.  Also x-ray shows a right intertrochanteric femur fracture, tib/fib without fracture.  Hand was consulted and Dr. Sharl recommended placing patient in a sugar-tong splint and making her n.p.o. after midnight.  Spoke with Dr. Georgina with orthopedics and he reports that her hip will need repair tomorrow but it may be later in the day.  Patient will need admission by medicine for surgical clearance.  She does have a guardian however attempted to call her son and he did not answer the phone.  Patient is more comfortable after pain medication.  Discussed all this with the patient      Final diagnoses:  Fall, initial encounter  AKI (acute kidney injury) (HCC)  Displaced intertrochanteric fracture of right femur, initial encounter for closed fracture [facility])  Closed fracture of distal end of right ulna, unspecified fracture morphology, initial encounter  Other closed intra-articular fracture of distal end of right radius, initial encounter    ED Discharge Orders     None          Doretha Folks, MD 05/04/24 2337

## 2024-05-04 NOTE — Consult Note (Signed)
 Orthopedic Surgery Consult Note  Assessment: Patient is a 89 y.o. female with right intertrochanteric femur fracture   Plan: -Operative plans: will need operative fixation of her intertrochanteric femur fracture, planning for evening of 8/25 -Distal radius fracture per hand surgery -Diet: NPO starting at 9am 8/25 -DVT ppx: aspirin  81mg  BID post-operatively -Ancef and TXA on call to OR -Weight bearing status: NWB RLE -PT evaluate and treat post-op -Pain control -Dispo: admit to medicine   Discussed recommendation for operative intervention in the form of right intertrochanteric femur fracture open reduction internal fixation with cephalomedullary rod. Explained the risks of this procedure included, but were not limited to: nonunion, malunion, hardware failure, infection, bleeding, stiffness, screw cut out, periprosthetic fracture, need for additional procedures, deep vein thrombosis, pulmonary embolism, and death. The benefits of this procedure would be to promote fracture healing by providing stability and to allow for early mobilization. The alternatives of this surgery would be to treat the fracture with immobilization in traction or to do no intervention. The patient's questions were answered to her and her son's satisfaction. After this discussion, patient elected to proceed with surgery. Informed consent was obtained.   ___________________________________________________________________________   Reason for consult: right intertrochanteric femur fracture  History:  Patient is a 89 y.o. female who had a ground level fall at her assisted living facility. Noted acute onset of right wrist and right hip pain. Was unable to ambulate so she was brought to [facility]. She was found to have a distal radius fracture for which hand surgery was consulted. She was found to have a intertrochanteric femur fracture for which orthopedics was consulted.  Patient was admitted to medicine.  She is  reporting right hip and right wrist pain.  No pain elsewhere.  Review of systems: General: denies fevers and chills, myalgias Neurologic: denies recent changes in vision, slurred speech Abdomen: denies nausea, vomiting, hematemesis Respiratory: denies cough, shortness of breath  Past medical history:  HTN Dementia Chronic pain BPPV Anxiety HLD IBS Insomnia Atrial fibrillation  Allergies: HCTZ, penicillin, atorvastatin, ciprofloxacin , doxycycline, hydrocodone , iodinated contrast, Lasix, latex, niacin, statins, metoprolol , carbamazepine, codeine, nickel, sulfa  Past surgical history:  Hysterectomy Cataract surgery C-section  Social history: Denies use of nicotine-containing products (cigarettes, vaping, smokeless, etc.) Alcohol  use: Denies Denies use of recreational drugs  Family history: -reviewed and not pertinent to intertrochanteric femur fracture   Physical Exam:  General: no acute distress, appears stated age Neurologic: alert, answering questions appropriately, following commands Cardiovascular: regular rate, no cyanosis Respiratory: unlabored breathing on room air, symmetric chest rise Psychiatric: appropriate affect, normal cadence to speech  MSK:   - Right upper extremity  No tenderness to palpation over extremity, except over the wrist.  Gross deformity of the wrist, no open wounds of the wrist Fires deltoid, biceps, triceps, finger extensors, finger flexors.  Does not fire wrist extensors or flexors due to pain  AIN/PIN/IO intact  Palpable radial pulse  Sensation intact to light touch in median/ulnar/radial/axillary nerve distributions  Hand warm and well perfused  -Left upper extremity  No tenderness to palpation over extremity, no gross deformity, no open wounds Fires deltoid, biceps, triceps, wrist extensors, wrist flexors, finger extensors, finger flexors  AIN/PIN/IO intact  Palpable radial pulse  Sensation intact to light touch in  median/ulnar/radial/axillary nerve distributions  Hand warm and well perfused  -Left lower extremity  No tenderness to palpation over extremity, no pain with log roll, no gross deformity, no open wounds Fires hip flexors, quadriceps, hamstrings, tibialis  anterior, gastrocnemius and soleus, extensor hallucis longus Plantarflexes and dorsiflexes toes Sensation intact to light touch in sural, saphenous, tibial, deep peroneal, and superficial peroneal nerve distributions Foot warm and well perfused  - Right lower extremity  No tenderness to palpation over extremity, except around the hip.  Pain with logroll, leg shortened compared to contralateral side, no open wounds Does not fire hip flexors, quadriceps, hamstrings due to pain.  Fires tibialis anterior, gastrocnemius and soleus, extensor hallucis longus Plantarflexes and dorsiflexes toes Sensation intact to light touch in sural, saphenous, tibial, deep peroneal, and superficial peroneal nerve distributions Foot warm and well perfused  Imaging: XRs of the right hip from 05/04/2024 were independently reviewed and interpreted, showing displaced, shortened intertrochanteric femur fracture. The fracture is varus alignment. No other fracture seen. No dislocation seen.    Patient name: Tiffany Black Patient MRN: [id number] Date: 05/05/2024

## 2024-05-05 ENCOUNTER — Inpatient Hospital Stay (HOSPITAL_COMMUNITY): Admitting: Certified Registered"

## 2024-05-05 ENCOUNTER — Inpatient Hospital Stay (HOSPITAL_COMMUNITY)

## 2024-05-05 ENCOUNTER — Encounter (HOSPITAL_COMMUNITY): Payer: Self-pay | Admitting: Family Medicine

## 2024-05-05 ENCOUNTER — Other Ambulatory Visit: Payer: Self-pay

## 2024-05-05 ENCOUNTER — Encounter (HOSPITAL_COMMUNITY)
Admission: EM | Disposition: A | Discharge status: Skilled Nursing Facility | Payer: Self-pay | Source: Skilled Nursing Facility | Attending: Internal Medicine

## 2024-05-05 DIAGNOSIS — N1832 Chronic kidney disease, stage 3b: Secondary | ICD-10-CM

## 2024-05-05 DIAGNOSIS — N179 Acute kidney failure, unspecified: Secondary | ICD-10-CM | POA: Diagnosis not present

## 2024-05-05 DIAGNOSIS — I5033 Acute on chronic diastolic (congestive) heart failure: Secondary | ICD-10-CM

## 2024-05-05 DIAGNOSIS — S72141A Displaced intertrochanteric fracture of right femur, initial encounter for closed fracture: Secondary | ICD-10-CM | POA: Diagnosis not present

## 2024-05-05 DIAGNOSIS — I130 Hypertensive heart and chronic kidney disease with heart failure and stage 1 through stage 4 chronic kidney disease, or unspecified chronic kidney disease: Secondary | ICD-10-CM | POA: Diagnosis not present

## 2024-05-05 DIAGNOSIS — S52501A Unspecified fracture of the lower end of right radius, initial encounter for closed fracture: Secondary | ICD-10-CM | POA: Diagnosis present

## 2024-05-05 DIAGNOSIS — S72001A Fracture of unspecified part of neck of right femur, initial encounter for closed fracture: Secondary | ICD-10-CM | POA: Diagnosis not present

## 2024-05-05 DIAGNOSIS — I4819 Other persistent atrial fibrillation: Secondary | ICD-10-CM | POA: Diagnosis not present

## 2024-05-05 HISTORY — PX: INTRAMEDULLARY (IM) NAIL INTERTROCHANTERIC: SHX5875

## 2024-05-05 LAB — BASIC METABOLIC PANEL WITH GFR
Anion gap: 9 (ref 5–15)
Anion gap: 8 (ref 5–15)
BUN: 40 mg/dL — ABNORMAL HIGH (ref 8–23)
BUN: 35 mg/dL — ABNORMAL HIGH (ref 8–23)
CO2: 21 mmol/L — ABNORMAL LOW (ref 22–32)
CO2: 21 mmol/L — ABNORMAL LOW (ref 22–32)
Calcium: 9.3 mg/dL (ref 8.9–10.3)
Calcium: 8.9 mg/dL (ref 8.9–10.3)
Chloride: 104 mmol/L (ref 98–111)
Chloride: 106 mmol/L (ref 98–111)
Creatinine, Ser: 2.13 mg/dL — ABNORMAL HIGH (ref 0.44–1.00)
Creatinine, Ser: 1.76 mg/dL — ABNORMAL HIGH (ref 0.44–1.00)
GFR, Estimated: 22 mL/min — ABNORMAL LOW (ref >60)
GFR, Estimated: 27 mL/min — ABNORMAL LOW (ref >60)
Glucose, Bld: 142 mg/dL — ABNORMAL HIGH (ref 70–99)
Glucose, Bld: 193 mg/dL — ABNORMAL HIGH (ref 70–99)
Potassium: 5.8 mmol/L — ABNORMAL HIGH (ref 3.5–5.1)
Potassium: 5.4 mmol/L — ABNORMAL HIGH (ref 3.5–5.1)
Sodium: 134 mmol/L — ABNORMAL LOW (ref 135–145)
Sodium: 135 mmol/L (ref 135–145)

## 2024-05-05 LAB — POCT I-STAT, CHEM 8
BUN: 35 mg/dL — ABNORMAL HIGH (ref 8–23)
Calcium, Ion: 1.22 mmol/L (ref 1.15–1.40)
Chloride: 106 mmol/L (ref 98–111)
Creatinine, Ser: 1.7 mg/dL — ABNORMAL HIGH (ref 0.44–1.00)
Glucose, Bld: 131 mg/dL — ABNORMAL HIGH (ref 70–99)
HCT: 32 % — ABNORMAL LOW (ref 36.0–46.0)
Hemoglobin: 10.9 g/dL — ABNORMAL LOW (ref 12.0–15.0)
Potassium: 5 mmol/L (ref 3.5–5.1)
Sodium: 136 mmol/L (ref 135–145)
TCO2: 21 mmol/L — ABNORMAL LOW (ref 22–32)

## 2024-05-05 LAB — CBC
HCT: 35.1 % — ABNORMAL LOW (ref 36.0–46.0)
Hemoglobin: 11.5 g/dL — ABNORMAL LOW (ref 12.0–15.0)
MCH: 31.4 pg (ref 26.0–34.0)
MCHC: 32.8 g/dL (ref 30.0–36.0)
MCV: 95.9 fL (ref 80.0–100.0)
Platelets: 167 K/uL (ref 150–400)
RBC: 3.66 MIL/uL — ABNORMAL LOW (ref 3.87–5.11)
RDW: 12.9 % (ref 11.5–15.5)
WBC: 11.3 K/uL — ABNORMAL HIGH (ref 4.0–10.5)
nRBC: 0 % (ref 0.0–0.2)

## 2024-05-05 LAB — SURGICAL PCR SCREEN
MRSA, PCR: NEGATIVE
Staphylococcus aureus: NEGATIVE

## 2024-05-05 SURGERY — FIXATION, FRACTURE, INTERTROCHANTERIC, WITH INTRAMEDULLARY ROD
Anesthesia: General | Laterality: Right

## 2024-05-05 MED ORDER — STERILE WATER FOR INJECTION IJ SOLN
INTRAMUSCULAR | Status: AC
  Filled 2024-05-05: qty 10

## 2024-05-05 MED ORDER — VASOPRESSIN 20 UNIT/ML IV SOLN
INTRAVENOUS | Status: AC
  Filled 2024-05-05: qty 1

## 2024-05-05 MED ORDER — VANCOMYCIN HCL IN DEXTROSE 1-5 GM/200ML-% IV SOLN
1000.0000 mg | INTRAVENOUS | Status: AC
  Administered 2024-05-06 – 2024-05-07 (×2): 1000 mg via INTRAVENOUS
  Filled 2024-05-05 (×2): qty 200

## 2024-05-05 MED ORDER — ROCURONIUM BROMIDE 10 MG/ML (PF) SYRINGE
PREFILLED_SYRINGE | INTRAVENOUS | Status: AC
  Filled 2024-05-05: qty 10

## 2024-05-05 MED ORDER — FENTANYL CITRATE (PF) 100 MCG/2ML IJ SOLN: 25.0000 ug | INTRAMUSCULAR | Status: DC | PRN

## 2024-05-05 MED ORDER — ETOMIDATE 2 MG/ML IV SOLN
INTRAVENOUS | Status: DC | PRN
  Administered 2024-05-05: 18 mg via INTRAVENOUS

## 2024-05-05 MED ORDER — PHENYLEPHRINE HCL-NACL 20-0.9 MG/250ML-% IV SOLN
INTRAVENOUS | Status: DC | PRN
  Administered 2024-05-05: 30 ug/min via INTRAVENOUS

## 2024-05-05 MED ORDER — QUETIAPINE FUMARATE 25 MG PO TABS
50.0000 mg | ORAL_TABLET | Freq: Every day | ORAL | Status: DC
  Administered 2024-05-05 – 2024-05-13 (×10): 50 mg via ORAL
  Filled 2024-05-05 (×10): qty 2

## 2024-05-05 MED ORDER — AMLODIPINE BESYLATE 10 MG PO TABS
10.0000 mg | ORAL_TABLET | Freq: Every day | ORAL | Status: DC
  Administered 2024-05-05 – 2024-05-14 (×9): 10 mg via ORAL
  Filled 2024-05-05 (×9): qty 1

## 2024-05-05 MED ORDER — OLANZAPINE 10 MG IM SOLR
2.5000 mg | Freq: Once | INTRAMUSCULAR | Status: DC | PRN
  Filled 2024-05-05: qty 10

## 2024-05-05 MED ORDER — FLUOXETINE HCL 10 MG PO CAPS
10.0000 mg | ORAL_CAPSULE | Freq: Every day | ORAL | Status: DC
  Administered 2024-05-05 – 2024-05-14 (×9): 10 mg via ORAL
  Filled 2024-05-05 (×10): qty 1

## 2024-05-05 MED ORDER — OXYCODONE HCL 5 MG/5ML PO SOLN: 5.0000 mg | Freq: Once | ORAL | Status: DC | PRN

## 2024-05-05 MED ORDER — METHOCARBAMOL 500 MG PO TABS
500.0000 mg | ORAL_TABLET | Freq: Four times a day (QID) | ORAL | Status: DC | PRN
  Administered 2024-05-07 – 2024-05-14 (×4): 500 mg via ORAL
  Filled 2024-05-05 (×4): qty 1

## 2024-05-05 MED ORDER — ORAL CARE MOUTH RINSE: 15.0000 mL | Freq: Once | OROMUCOSAL | Status: AC

## 2024-05-05 MED ORDER — ONDANSETRON HCL 4 MG/2ML IJ SOLN
INTRAMUSCULAR | Status: AC
  Filled 2024-05-05: qty 2

## 2024-05-05 MED ORDER — INSULIN ASPART 100 UNIT/ML IV SOLN
10.0000 [IU] | Freq: Once | INTRAVENOUS | Status: AC
  Administered 2024-05-05: 10 [IU] via INTRAVENOUS

## 2024-05-05 MED ORDER — ROCURONIUM BROMIDE 10 MG/ML (PF) SYRINGE
PREFILLED_SYRINGE | INTRAVENOUS | Status: DC | PRN
  Administered 2024-05-05: 50 mg via INTRAVENOUS
  Administered 2024-05-05: 70 mg via INTRAVENOUS
  Administered 2024-05-05: 50 mg via INTRAVENOUS

## 2024-05-05 MED ORDER — VANCOMYCIN HCL IN DEXTROSE 1-5 GM/200ML-% IV SOLN
1000.0000 mg | INTRAVENOUS | Status: AC
  Administered 2024-05-05: 1000 mg via INTRAVENOUS
  Filled 2024-05-05: qty 200

## 2024-05-05 MED ORDER — FENTANYL CITRATE (PF) 100 MCG/2ML IJ SOLN
INTRAMUSCULAR | Status: DC | PRN
  Administered 2024-05-05 (×2): 50 ug via INTRAVENOUS

## 2024-05-05 MED ORDER — SODIUM CHLORIDE 0.9 % IV BOLUS
500.0000 mL | Freq: Once | INTRAVENOUS | Status: AC
  Administered 2024-05-05: 500 mL via INTRAVENOUS

## 2024-05-05 MED ORDER — LORAZEPAM 0.5 MG PO TABS
0.5000 mg | ORAL_TABLET | Freq: Every day | ORAL | Status: DC
  Administered 2024-05-05 – 2024-05-14 (×10): 0.5 mg via ORAL
  Filled 2024-05-05 (×11): qty 1

## 2024-05-05 MED ORDER — OXYCODONE HCL 5 MG PO TABS: 5.0000 mg | ORAL_TABLET | Freq: Once | ORAL | Status: DC | PRN

## 2024-05-05 MED ORDER — VANCOMYCIN HCL IN DEXTROSE 1-5 GM/200ML-% IV SOLN: 1000.0000 mg | INTRAVENOUS | Status: DC

## 2024-05-05 MED ORDER — ONDANSETRON HCL 4 MG/2ML IJ SOLN
4.0000 mg | Freq: Four times a day (QID) | INTRAMUSCULAR | Status: DC | PRN
  Administered 2024-05-05: 4 mg via INTRAVENOUS
  Filled 2024-05-05: qty 2

## 2024-05-05 MED ORDER — VANCOMYCIN HCL 500 MG IV SOLR
INTRAVENOUS | Status: AC
  Filled 2024-05-05: qty 10

## 2024-05-05 MED ORDER — VANCOMYCIN HCL 500 MG IV SOLR
INTRAVENOUS | Status: DC | PRN
Start: 2024-05-05 — End: 2024-05-05
  Administered 2024-05-05: 500 mg via TOPICAL

## 2024-05-05 MED ORDER — ONDANSETRON HCL 4 MG/2ML IJ SOLN: 4.0000 mg | Freq: Four times a day (QID) | INTRAMUSCULAR | Status: DC | PRN

## 2024-05-05 MED ORDER — PHENYLEPHRINE 80 MCG/ML (10ML) SYRINGE FOR IV PUSH (FOR BLOOD PRESSURE SUPPORT)
PREFILLED_SYRINGE | INTRAVENOUS | Status: DC | PRN
  Administered 2024-05-05 (×2): 80 ug via INTRAVENOUS

## 2024-05-05 MED ORDER — FENTANYL CITRATE (PF) 100 MCG/2ML IJ SOLN
INTRAMUSCULAR | Status: AC
  Filled 2024-05-05: qty 2

## 2024-05-05 MED ORDER — 0.9 % SODIUM CHLORIDE (POUR BTL) OPTIME
TOPICAL | Status: DC | PRN
  Administered 2024-05-05: 1000 mL

## 2024-05-05 MED ORDER — DEXTROSE 50 % IV SOLN
1.0000 | Freq: Once | INTRAVENOUS | Status: AC
  Administered 2024-05-05: 50 mL via INTRAVENOUS
  Filled 2024-05-05: qty 50

## 2024-05-05 MED ORDER — DEXAMETHASONE SODIUM PHOSPHATE 10 MG/ML IJ SOLN
INTRAMUSCULAR | Status: DC | PRN
Start: 2024-05-05 — End: 2024-05-05
  Administered 2024-05-05: 5 mg via INTRAVENOUS

## 2024-05-05 MED ORDER — ALBUMIN HUMAN 5 % IV SOLN: INTRAVENOUS | Status: DC | PRN

## 2024-05-05 MED ORDER — ONDANSETRON HCL 4 MG/2ML IJ SOLN
INTRAMUSCULAR | Status: DC | PRN
  Administered 2024-05-05: 4 mg via INTRAVENOUS

## 2024-05-05 MED ORDER — CHLORHEXIDINE GLUCONATE 0.12 % MT SOLN: 15.0000 mL | Freq: Once | OROMUCOSAL | Status: AC

## 2024-05-05 MED ORDER — SODIUM CHLORIDE 0.9 % IV SOLN
INTRAVENOUS | Status: DC
Start: 2024-05-05 — End: 2024-05-05

## 2024-05-05 MED ORDER — PHENYLEPHRINE 80 MCG/ML (10ML) SYRINGE FOR IV PUSH (FOR BLOOD PRESSURE SUPPORT)
PREFILLED_SYRINGE | INTRAVENOUS | Status: AC
  Filled 2024-05-05: qty 10

## 2024-05-05 MED ORDER — FENTANYL CITRATE PF 50 MCG/ML IJ SOSY
25.0000 ug | PREFILLED_SYRINGE | INTRAMUSCULAR | Status: DC | PRN
  Administered 2024-05-05 – 2024-05-11 (×8): 50 ug via INTRAVENOUS
  Filled 2024-05-05 (×8): qty 1

## 2024-05-05 MED ORDER — BUSPIRONE HCL 5 MG PO TABS
7.5000 mg | ORAL_TABLET | Freq: Three times a day (TID) | ORAL | Status: DC
  Administered 2024-05-05 – 2024-05-14 (×27): 7.5 mg via ORAL
  Filled 2024-05-05 (×27): qty 2

## 2024-05-05 MED ORDER — DEXAMETHASONE SODIUM PHOSPHATE 10 MG/ML IJ SOLN
INTRAMUSCULAR | Status: AC
  Filled 2024-05-05: qty 1

## 2024-05-05 MED ORDER — METOPROLOL TARTRATE 25 MG PO TABS
25.0000 mg | ORAL_TABLET | Freq: Two times a day (BID) | ORAL | Status: DC
  Administered 2024-05-05 – 2024-05-14 (×18): 25 mg via ORAL
  Filled 2024-05-05 (×19): qty 1

## 2024-05-05 MED ORDER — CHLORHEXIDINE GLUCONATE 0.12 % MT SOLN
OROMUCOSAL | Status: AC
  Administered 2024-05-05: 15 mL via OROMUCOSAL
  Filled 2024-05-05: qty 15

## 2024-05-05 MED ORDER — PROPOFOL 10 MG/ML IV BOLUS
INTRAVENOUS | Status: AC
  Filled 2024-05-05: qty 20

## 2024-05-05 MED ORDER — LIDOCAINE 2% (20 MG/ML) 5 ML SYRINGE
INTRAMUSCULAR | Status: AC
Start: 2024-05-05 — End: 2024-05-05
  Filled 2024-05-05: qty 5

## 2024-05-05 MED ORDER — EPHEDRINE 5 MG/ML INJ
INTRAVENOUS | Status: AC
  Filled 2024-05-05: qty 5

## 2024-05-05 MED ORDER — DEXMEDETOMIDINE HCL IN NACL 80 MCG/20ML IV SOLN
INTRAVENOUS | Status: DC | PRN
  Administered 2024-05-05: 8 ug via INTRAVENOUS

## 2024-05-05 MED ORDER — TRANEXAMIC ACID-NACL 1000-0.7 MG/100ML-% IV SOLN
1000.0000 mg | INTRAVENOUS | Status: AC
  Administered 2024-05-05: 1000 mg via INTRAVENOUS
  Filled 2024-05-05: qty 100

## 2024-05-05 MED ORDER — SUGAMMADEX SODIUM 200 MG/2ML IV SOLN
INTRAVENOUS | Status: DC | PRN
  Administered 2024-05-05: 160 mg via INTRAVENOUS

## 2024-05-05 MED ORDER — LIDOCAINE 2% (20 MG/ML) 5 ML SYRINGE
INTRAMUSCULAR | Status: DC | PRN
  Administered 2024-05-05: 60 mg via INTRAVENOUS

## 2024-05-05 MED ORDER — LORAZEPAM 0.5 MG PO TABS
0.5000 mg | ORAL_TABLET | Freq: Three times a day (TID) | ORAL | Status: DC | PRN
  Administered 2024-05-09 – 2024-05-13 (×6): 0.5 mg via ORAL
  Filled 2024-05-05 (×6): qty 1

## 2024-05-05 MED ORDER — SODIUM CHLORIDE 0.9 % IV SOLN: INTRAVENOUS | Status: AC

## 2024-05-05 MED ORDER — ACETAMINOPHEN 500 MG PO TABS
1000.0000 mg | ORAL_TABLET | Freq: Three times a day (TID) | ORAL | Status: DC
  Administered 2024-05-05 – 2024-05-14 (×23): 1000 mg via ORAL
  Filled 2024-05-05 (×23): qty 2

## 2024-05-05 MED ORDER — OXYCODONE HCL 5 MG PO TABS
5.0000 mg | ORAL_TABLET | ORAL | Status: DC | PRN
  Administered 2024-05-05: 5 mg via ORAL
  Filled 2024-05-05 (×2): qty 1

## 2024-05-05 SURGICAL SUPPLY — 30 items
BIT DRILL INTERTAN LAG SCREW (BIT) IMPLANT
BIT DRILL LONG 4.0 (BIT) IMPLANT
BNDG COHESIVE 6X5 TAN NS LF (GAUZE/BANDAGES/DRESSINGS) ×1 IMPLANT
COVER PERINEAL POST (MISCELLANEOUS) ×1 IMPLANT
COVER SURGICAL LIGHT HANDLE (MISCELLANEOUS) ×1 IMPLANT
DRAPE C-ARM 42X72 X-RAY (DRAPES) ×1 IMPLANT
DRAPE C-ARMOR (DRAPES) ×1 IMPLANT
DRAPE STERI IOBAN 125X83 (DRAPES) ×1 IMPLANT
DRSG TEGADERM 4X4.75 (GAUZE/BANDAGES/DRESSINGS) ×5 IMPLANT
DURAPREP 26ML APPLICATOR (WOUND CARE) ×1 IMPLANT
GAUZE SPONGE 4X4 12PLY STRL (GAUZE/BANDAGES/DRESSINGS) ×1 IMPLANT
GAUZE XEROFORM 1X8 LF (GAUZE/BANDAGES/DRESSINGS) ×1 IMPLANT
GAUZE XEROFORM 5X9 LF (GAUZE/BANDAGES/DRESSINGS) IMPLANT
GLOVE INDICATOR 7.5 STRL GRN (GLOVE) ×1 IMPLANT
GLOVE SS BIOGEL STRL SZ 7.5 (GLOVE) ×1 IMPLANT
GOWN STRL SURGICAL XL XLNG (GOWN DISPOSABLE) ×1 IMPLANT
GUIDEROD BALL TIP 3.0X800 (ORTHOPEDIC DISPOSABLE SUPPLIES) IMPLANT
KIT BASIN OR (CUSTOM PROCEDURE TRAY) ×1 IMPLANT
KIT TURNOVER KIT B (KITS) ×1 IMPLANT
NAIL TRIGEN INTERTAN 10X18CM (Nail) IMPLANT
NS IRRIG 1000ML POUR BTL (IV SOLUTION) ×1 IMPLANT
PACK GENERAL/GYN (CUSTOM PROCEDURE TRAY) ×1 IMPLANT
PAD ARMBOARD POSITIONER FOAM (MISCELLANEOUS) ×1 IMPLANT
PIN GUIDE 3.2X343MM (PIN) IMPLANT
SCREW LAG COMPR KIT 90/85 (Screw) IMPLANT
SCREW TRIGEN LOW PROF 5.0X30 (Screw) IMPLANT
STAPLER VISISTAT (STAPLE) ×1 IMPLANT
SUT VIC AB 0 CT1 18XCR BRD8 (SUTURE) ×1 IMPLANT
SUT VIC AB 2-0 CT1 18 (SUTURE) ×1 IMPLANT
WATER STERILE IRR 1000ML POUR (IV SOLUTION) ×1 IMPLANT

## 2024-05-05 NOTE — Progress Notes (Signed)
 Progress Note   Patient: Tiffany Black FMW:[id number] DOB: 1934-04-10 DOA: 05/04/2024  DOS: the patient was seen and examined on 05/05/2024   Brief hospital course:  29F resides at [facility] with Hx of advanced dementia, chronic HFpEF, atrial fibrillation no longer anticoagulated, CKD 3 who presents with right wrist pain and right leg pain after a fall.  Subsequently found to have right intratrochanteric fracture, distal ulnar and radius fracture.  Assessment and Plan:  Right intertrochanteric femur fracture - Evaluated by orthopedic surgery.  Planning for surgical correction later this evening.  Currently NPO.  Transition to aspirin  81 mg twice daily postop.  Pain control on board.  Right distal ulnar/radius fracture - Short arm splint placed in the emergency department.  Ortho/hand surgery following.  Acute kidney injury on CKD 3B - Creatinine mildly elevated on presentation.  Appears somewhat stable.  IV fluids on board  Monitor urine output recheck kidney function in AM.  Mild hyperkalemia - Potassium 5.8 this morning.  Initiated D50, IV insulin , IV fluid bolus.  Will recheck BMP this afternoon.  Currently NPO for impending surgical procedure.  Atrial fibrillation - Patient does not take anticoagulation.  Continue metoprolol .  Chronic HFpEF - Holding diuretics.  Does not appear to be acutely exacerbated.  Dementia/anxiety - Pleasantly demented, does not remember falling or her injuries.  Will continue home medications once able to tolerate p.o.  Hypertension - Amlodipine , metoprolol  on board.  Subjective: Patient resting comfortably this morning.  Pleasantly demented.  Does not remember falling nor having any injuries.  Does admit to right hip pain.  Reiterated to patient about her fractures and impending surgery.  Patient currently denies any fever, chills, chest pain, shortness of breath, nausea, vomiting, abdominal pain.  Physical Exam:  Vitals:    05/05/24 0149 05/05/24 0220 05/05/24 0500 05/05/24 0837  BP: (!) 149/90  105/62 116/69  Pulse: (!) 108  94 (!) 101  Resp: 16  18 19   Temp: 98.1 F (36.7 C)  98 F (36.7 C) 97.9 F (36.6 C)  TempSrc: Oral  Oral   SpO2: 95% (!) 88% 96% 92%  Weight: 75.7 kg     Height: 5' 9 (1.753 m)       GENERAL:  Alert, pleasant, no acute distress HEENT:  EOMI CARDIOVASCULAR:  RRR, no murmurs appreciated RESPIRATORY:  Clear to auscultation, no wheezing, rales, or rhonchi GASTROINTESTINAL:  Soft, nontender, nondistended EXTREMITIES: RUE in splint, RLE shortened, tender hip NEURO:  No new focal deficits appreciated SKIN:  No rashes noted PSYCH: Pleasantly demented    Data Reviewed:  Imaging Studies: DG Wrist Complete Right Result Date: 05/04/2024 CLINICAL DATA:  pain after fall EXAM: RIGHT WRIST - COMPLETE 3+ VIEW COMPARISON:  None Available. FINDINGS: Diffusely decreased bone density. Acute intra-articular, comminuted, and displaced distal radial metadiaphysis fracture. Question anterior dislocation of the radius in relation to the carpal bones. Acute displaced and comminuted distal ulnar fracture. Scaphoid degenerative changes. Subcutaneus soft tissue edema. IMPRESSION: 1. Acute intra-articular, comminuted, and displaced distal radial metadiaphysis fracture. 2. Acute displaced and comminuted distal ulnar fracture. 3. Question anterior dislocation of the radius in relation to the carpal bones. Electronically Signed   By: Morgane  Naveau M.D.   On: 05/04/2024 21:15   DG Tibia/Fibula Right Result Date: 05/04/2024 CLINICAL DATA:  pain after fall EXAM: RIGHT TIBIA AND FIBULA - 2 VIEW COMPARISON:  None Available. FINDINGS: There is no evidence of fracture or other focal bone lesions. Knee and ankle grossly unremarkable. Soft tissues  are unremarkable. Vascular calcification. IMPRESSION: Negative for acute traumatic injury. Electronically Signed   By: Morgane  Naveau M.D.   On: 05/04/2024 21:13   DG  Humerus Right Result Date: 05/04/2024 CLINICAL DATA:  pain after fall EXAM: RIGHT HUMERUS - 2+ VIEW COMPARISON:  None Available. FINDINGS: There is no evidence of fracture or other focal bone lesions. Soft tissues are unremarkable. IMPRESSION: Negative. Electronically Signed   By: Morgane  Naveau M.D.   On: 05/04/2024 21:12   DG Hip Unilat W or Wo Pelvis 2-3 Views Right Result Date: 05/04/2024 CLINICAL DATA:  pain after fall EXAM: DG HIP (WITH OR WITHOUT PELVIS) 2-3V RIGHT COMPARISON:  None Available. FINDINGS: Acute displaced and comminuted right femoral intertrochanteric fracture. No right hip dislocation. No acute displaced fracture or dislocation of left hip. No acute displaced fracture or diastasis of the bones of the pelvis. There is no evidence of arthropathy or other focal bone abnormality. IMPRESSION: Acute displaced and comminuted right femoral intertrochanteric fracture. Electronically Signed   By: Morgane  Naveau M.D.   On: 05/04/2024 21:12   DG Chest Port 1 View Result Date: 05/04/2024 CLINICAL DATA:  pain after fall EXAM: PORTABLE CHEST 1 VIEW COMPARISON:  Chest x-ray 05/01/2023 FINDINGS: The heart and mediastinal contours are unchanged. Atherosclerotic plaque. No focal consolidation. No pulmonary edema. No pleural effusion. No pneumothorax. No acute osseous abnormality. IMPRESSION: 1. No active disease. 2.  Aortic Atherosclerosis (ICD10-I70.0). Electronically Signed   By: Morgane  Naveau M.D.   On: 05/04/2024 21:10    Imaging as above  Previous records (including but not limited to H&P, progress notes, nursing notes, TOC management) were reviewed in assessment of this patient.  Labs: CBC: Recent Labs  Lab 05/04/24 2023 05/05/24 0625  WBC 8.2 11.3*  NEUTROABS 6.0  --   HGB 13.4 11.5*  HCT 40.7 35.1*  MCV 95.3 95.9  PLT 224 167   Basic Metabolic Panel: Recent Labs  Lab 05/04/24 2023 05/05/24 0625  NA 134* 134*  K 4.6 5.8*  CL 100 104  CO2 19* 21*  GLUCOSE 145* 142*   BUN 40* 40*  CREATININE 2.09* 2.13*  CALCIUM  9.9 9.3   Liver Function Tests: No results for input(s): AST, ALT, ALKPHOS, BILITOT, PROT, ALBUMIN  in the last 168 hours. CBG: No results for input(s): GLUCAP in the last 168 hours.  Scheduled Meds:  amLODipine   10 mg Oral Daily   busPIRone   7.5 mg Oral TID   FLUoxetine   10 mg Oral Daily   LORazepam   0.5 mg Oral Q1200   metoprolol  tartrate  25 mg Oral BID   QUEtiapine   50 mg Oral QHS   Continuous Infusions:  sodium chloride  75 mL/hr at 05/05/24 0545   tranexamic acid      vancomycin      PRN Meds:.fentaNYL  (SUBLIMAZE ) injection, LORazepam , methocarbamol , OLANZapine , ondansetron  (ZOFRAN ) IV, oxyCODONE   Family Communication: None at bedside  Disposition: Status is: Inpatient Remains inpatient appropriate because: Right hip fracture     Time spent: 38 minutes  Length of inpatient stay: 1 days  Author: Carliss LELON Canales, DO 05/05/2024 1:08 PM  For on call review [email or web address].

## 2024-05-05 NOTE — Anesthesia Postprocedure Evaluation (Signed)
 Anesthesia Post Note  Patient: Jadee Golebiewski  Procedure(s) Performed: FIXATION, FRACTURE, INTERTROCHANTERIC, WITH INTRAMEDULLARY ROD (Right)     Patient location during evaluation: PACU Anesthesia Type: General Level of consciousness: awake and alert Pain management: pain level controlled Vital Signs Assessment: post-procedure vital signs reviewed and stable Respiratory status: spontaneous breathing, nonlabored ventilation, respiratory function stable and patient connected to nasal cannula oxygen Cardiovascular status: blood pressure returned to baseline and stable Postop Assessment: no apparent nausea or vomiting Anesthetic complications: no   No notable events documented.  Last Vitals:  Vitals:   05/05/24 1915 05/05/24 1959  BP: 111/88 124/86  Pulse: (!) 102 94  Resp: 15   Temp:    SpO2: 97% 95%    Last Pain:  Vitals:   05/05/24 1836  TempSrc:   PainSc: Asleep                 Franky JONETTA Bald

## 2024-05-05 NOTE — H&P (Signed)
 History and Physical    Tiffany Black FMW:[id number] DOB: November 18, 1933 DOA: 05/04/2024  PCP: Tiffany Black., MD   Patient coming from: SNF   Chief Complaint: Fall, right wrist pain, right leg pain   HPI: Tiffany Black is an 89 y.o. female with medical history significant for hypertension, history of CVA, dementia, anxiety, chronic HFpEF, atrial fibrillation no longer anticoagulated, and CKD 3 who presents with right wrist pain and right leg pain after a fall.  Per report of nursing home personnel, the patient was in her usual state throughout the day but was noted to have fallen this evening.  She has been complaining of pain in her right wrist and right leg ever since then.  She denies headache, neck pain, abdominal pain, or chest pain.  She does not remember falling.  ED Course: Upon arrival to the ED, patient is found to be afebrile and saturating well on room air with normal RR, normal HR, and stable BP.  EKG demonstrates atrial fibrillation with LAFB and chest x-ray is negative for acute findings.  Plain radiographs  demonstrate acute right femoral intertrochanteric fracture, acute distal ulnar fracture, and acute distal radius fracture.  Labs are most notable for creatinine 2.09, normal WBC, and normal hemoglobin.  Hand surgery (Dr. Sharl) and orthopedic surgery (Dr. Georgina) were consulted by the ED physician and the patient was treated with fentanyl , morphine , and Zofran .  She was placed in his short arm splint.  Review of Systems:  ROS limited by patient's clinical condition.  Past Medical History:  Diagnosis Date   Anxiety    Arthritis    BPPV (benign paroxysmal positional vertigo)    Breast lesion    left   Bulging of cervical intervertebral disc    Cardiomegaly    Cataracts, bilateral    Chronic neck pain    CVA (cerebral vascular accident) (HCC)    x2 2013 and 2015; right temporal lobe and left frontal lobe infarct-> dzziness which resolved; occluded right  PCA, moderate posterior left cerebral stenosis, left MCA stenosis on MRA   Dementia [facility])    Essential hypertension    History of endometrial cancer    History of stroke    2015 and 2016 // carotid US  8/16: Bilateral 1-39 // carotid US  2/16: Bilateral ICA 1-39   HLD (hyperlipidemia)    Hx of echocardiogram    a. Echo (11/15): Mild LVH, EF 55-60%, normal wall motion, grade 2 diastolic dysfunction, mild LAE, normal RV function, PASP 29 mm Hg // echo 8/16:Mild concentric LVH, EF 55-60, normal wall motion, grade 2 diastolic dysfunction, mild LAE, PASP 39   Hypertension    IBS (irritable bowel syndrome)    Insomnia    Lung nodule    OA (osteoarthritis)    Pectus excavatum    Persistent atrial fibrillation (HCC)    Pleural effusion    right recurrent, 1/24 s/p thoracentesis x 3 (cytology negative)- Dr Kara   Pneumonia due to COVID-19 virus    Radius fracture    left distal   Vertigo    Vitamin D deficiency     Past Surgical History:  Procedure Laterality Date   ABDOMINAL HYSTERECTOMY  2011   CATARACT EXTRACTION, BILATERAL Bilateral 2009   CESAREAN SECTION  1975   IR THORACENTESIS ASP PLEURAL SPACE W/IMG GUIDE  09/28/2022   IR THORACENTESIS ASP PLEURAL SPACE W/IMG GUIDE  10/23/2022   LAPAROSCOPIC HYSTERECTOMY  2011   THORACENTESIS Right 11/03/2022   Procedure: THORACENTESIS;  Surgeon: Gladis Leonor HERO, MD;  Location: [facility] ENDOSCOPY;  Service: Pulmonary;  Laterality: Right;    Social History:   reports that she has never smoked. She has never used smokeless tobacco. She reports that she does not drink alcohol  and does not use drugs.  Allergies  Allergen Reactions   Hydrochlorothiazide Hives   Penicillins Hives and Anaphylaxis    Has patient had a PCN reaction causing immediate rash, facial/tongue/throat swelling, SOB or lightheadedness with hypotension: Yes  Has patient had a PCN reaction causing severe rash involving mucus membranes or skin necrosis: No  Has patient had a PCN  reaction that required hospitalization No  Has patient had a PCN reaction occurring within the last 10 years: No  If all of the above answers are NO, then may proceed with Cephalosporin use.   Atorvastatin Other (See Comments)    Weakness   Ciprofloxacin  Other (See Comments) and Hypertension    Blood pressure issues- patient doesn't recall if it increased OR decreased it     Doxycycline Diarrhea and Nausea And Vomiting   Hydrocodone -Acetaminophen  Other (See Comments)    Unknown reaction   Iodinated Contrast Media Diarrhea, Other (See Comments) and Hypertension    Elevated the B/P   Iodine-131 Other (See Comments) and Hypertension    Raised the blood pressure   Lasix [Furosemide] Other (See Comments)    No reaction listed on order summary report from [facility]   Latex Itching   Niaspan [Niacin]     Flu (from [facility] notes)   Statins Other (See Comments)    Muscle weakness   Toprol  Xl [Metoprolol ] Other (See Comments)    Patient taking metoprolol  tartrate without complications; applies to succinate only   Carbamazepine Other (See Comments) and Rash    Flushed blood pressure medication out of system   Codeine Nausea And Vomiting   Nickel Rash   Sulfa Antibiotics Hives and Rash   Sulfamethoxazole Hives and Rash   Sulfonamide Derivatives Hives and Rash         Family History  Problem Relation Age of Onset   Stroke Mother    Heart attack Father    Heart disease Father    Dementia Sister    CAD Brother    Stroke Maternal Uncle    Hypertension Neg Hx      Prior to Admission medications   Medication Sig Start Date End Date Taking? Authorizing Provider  acetaminophen  (TYLENOL ) 325 MG tablet Take 650 mg by mouth every 6 (six) hours as needed for moderate pain (pain score 4-6) or mild pain (pain score 1-3).    [provider]  amLODipine  (NORVASC ) 10 MG tablet Take 1 tablet (10 mg total) by mouth daily. 02/18/23   Rai, Nydia POUR, MD  bumetanide  (BUMEX ) 1 MG tablet Take 1  tablet (1 mg total) by mouth daily. 02/18/23   Rai, Ripudeep K, MD  busPIRone  (BUSPAR ) 7.5 MG tablet Take 7.5 mg by mouth 3 (three) times daily. 01/04/24   [provider]  FLUoxetine  (PROZAC ) 20 MG capsule Take 10 mg by mouth daily.    [provider]  GERI-TUSSIN 100 MG/5ML liquid Take 10 mLs by mouth every 6 (six) hours as needed. 01/08/24   [provider]  LORazepam  (ATIVAN ) 0.5 MG tablet Take 0.5 mg by mouth daily at 12 noon. Give one tablet by mouth daily in the afternoon for agitation. 12/24/23   [provider]  LORazepam  (ATIVAN ) 0.5 MG tablet Take 0.5 mg by mouth every  8 (eight) hours as needed for anxiety.    [provider]  metoprolol  tartrate (LOPRESSOR ) 25 MG tablet Take 25 mg by mouth 2 (two) times daily. 01/07/24   [provider]  nystatin cream (MYCOSTATIN) Apply 1 Application topically every 12 (twelve) hours as needed (Redness and rash). Apply to under bilateral breast topically every 12 hours as needed for redness/rash 11/03/23   [provider]  potassium chloride  (KLOR-CON  M) 10 MEQ tablet Take 10 mEq by mouth daily. 01/04/24   [provider]  QUEtiapine  (SEROQUEL ) 50 MG tablet Take 1 tablet (50 mg total) by mouth at bedtime. 08/15/22   Elgergawy, Brayton RAMAN, MD  spironolactone  (ALDACTONE ) 25 MG tablet Take 1 tablet (25 mg total) by mouth daily. 02/18/23   Rai, Nydia POUR, MD  valsartan  (DIOVAN ) 80 MG tablet Take 80 mg by mouth daily. 01/04/24   [provider]    Physical Exam: Vitals:   05/04/24 2015 05/05/24 0012  BP: 122/73   Pulse: 62   Resp: 19   Temp:  98.3 F (36.8 C)  TempSrc:  Oral  SpO2: 100%     Constitutional: NAD, no pallor or diaphoresis  Eyes: PERTLA, lids and conjunctivae normal ENMT: Mucous membranes are moist. Posterior pharynx clear of any exudate or lesions.   Neck: supple, no masses  Respiratory: no wheezing, no crackles. No accessory muscle use.  Cardiovascular: S1 & S2  heard, regular rate and rhythm. No extremity edema.  Abdomen: No tenderness, soft. Bowel sounds active.  Musculoskeletal: no clubbing / cyanosis. Right wrist immobilized in splint; neurovascularly intact. Right hip tender and deformed; neurovascularly intact.   Skin: no significant rashes, lesions, ulcers. Warm, dry, well-perfused. Neurologic: CN 2-12 grossly intact. Moving all extremities. Alert and oriented to person only.  Psychiatric: Calm. Cooperative.    Labs and Imaging on Admission: I have personally reviewed following labs and imaging studies  CBC: Recent Labs  Lab 05/04/24 2023  WBC 8.2  NEUTROABS 6.0  HGB 13.4  HCT 40.7  MCV 95.3  PLT 224   Basic Metabolic Panel: Recent Labs  Lab 05/04/24 2023  NA 134*  K 4.6  CL 100  CO2 19*  GLUCOSE 145*  BUN 40*  CREATININE 2.09*  CALCIUM  9.9   GFR: CrCl cannot be calculated (Unknown ideal weight.). Liver Function Tests: No results for input(s): AST, ALT, ALKPHOS, BILITOT, PROT, ALBUMIN  in the last 168 hours. No results for input(s): LIPASE, AMYLASE in the last 168 hours. No results for input(s): AMMONIA in the last 168 hours. Coagulation Profile: No results for input(s): INR, PROTIME in the last 168 hours. Cardiac Enzymes: No results for input(s): CKTOTAL, CKMB, CKMBINDEX, TROPONINI in the last 168 hours. BNP (last 3 results) No results for input(s): PROBNP in the last 8760 hours. HbA1C: No results for input(s): HGBA1C in the last 72 hours. CBG: No results for input(s): GLUCAP in the last 168 hours. Lipid Profile: No results for input(s): CHOL, HDL, LDLCALC, TRIG, CHOLHDL, LDLDIRECT in the last 72 hours. Thyroid  Function Tests: No results for input(s): TSH, T4TOTAL, FREET4, T3FREE, THYROIDAB in the last 72 hours. Anemia Panel: No results for input(s): VITAMINB12, FOLATE, FERRITIN, TIBC, IRON, RETICCTPCT in the last 72 hours. Urine analysis:     Component Value Date/Time   COLORURINE YELLOW 01/16/2024 1020   APPEARANCEUR CLEAR 01/16/2024 1020   LABSPEC 1.012 01/16/2024 1020   PHURINE 5.0 01/16/2024 1020   GLUCOSEU NEGATIVE 01/16/2024 1020   HGBUR NEGATIVE 01/16/2024 1020   BILIRUBINUR NEGATIVE  01/16/2024 1020   KETONESUR NEGATIVE 01/16/2024 1020   PROTEINUR NEGATIVE 01/16/2024 1020   UROBILINOGEN 0.2 05/22/2015 1950   NITRITE NEGATIVE 01/16/2024 1020   LEUKOCYTESUR MODERATE (A) 01/16/2024 1020   Sepsis Labs: @LABRCNTIP (procalcitonin:4,lacticidven:4) )No results found for this or any previous visit (from the past 240 hours).   Radiological Exams on Admission: DG Wrist Complete Right Result Date: 05/04/2024 CLINICAL DATA:  pain after fall EXAM: RIGHT WRIST - COMPLETE 3+ VIEW COMPARISON:  None Available. FINDINGS: Diffusely decreased bone density. Acute intra-articular, comminuted, and displaced distal radial metadiaphysis fracture. Question anterior dislocation of the radius in relation to the carpal bones. Acute displaced and comminuted distal ulnar fracture. Scaphoid degenerative changes. Subcutaneus soft tissue edema. IMPRESSION: 1. Acute intra-articular, comminuted, and displaced distal radial metadiaphysis fracture. 2. Acute displaced and comminuted distal ulnar fracture. 3. Question anterior dislocation of the radius in relation to the carpal bones. Electronically Signed   By: Morgane  Naveau M.D.   On: 05/04/2024 21:15   DG Tibia/Fibula Right Result Date: 05/04/2024 CLINICAL DATA:  pain after fall EXAM: RIGHT TIBIA AND FIBULA - 2 VIEW COMPARISON:  None Available. FINDINGS: There is no evidence of fracture or other focal bone lesions. Knee and ankle grossly unremarkable. Soft tissues are unremarkable. Vascular calcification. IMPRESSION: Negative for acute traumatic injury. Electronically Signed   By: Morgane  Naveau M.D.   On: 05/04/2024 21:13   DG Humerus Right Result Date: 05/04/2024 CLINICAL DATA:  pain after fall EXAM:  RIGHT HUMERUS - 2+ VIEW COMPARISON:  None Available. FINDINGS: There is no evidence of fracture or other focal bone lesions. Soft tissues are unremarkable. IMPRESSION: Negative. Electronically Signed   By: Morgane  Naveau M.D.   On: 05/04/2024 21:12   DG Hip Unilat W or Wo Pelvis 2-3 Views Right Result Date: 05/04/2024 CLINICAL DATA:  pain after fall EXAM: DG HIP (WITH OR WITHOUT PELVIS) 2-3V RIGHT COMPARISON:  None Available. FINDINGS: Acute displaced and comminuted right femoral intertrochanteric fracture. No right hip dislocation. No acute displaced fracture or dislocation of left hip. No acute displaced fracture or diastasis of the bones of the pelvis. There is no evidence of arthropathy or other focal bone abnormality. IMPRESSION: Acute displaced and comminuted right femoral intertrochanteric fracture. Electronically Signed   By: Morgane  Naveau M.D.   On: 05/04/2024 21:12   DG Chest Port 1 View Result Date: 05/04/2024 CLINICAL DATA:  pain after fall EXAM: PORTABLE CHEST 1 VIEW COMPARISON:  Chest x-ray 05/01/2023 FINDINGS: The heart and mediastinal contours are unchanged. Atherosclerotic plaque. No focal consolidation. No pulmonary edema. No pleural effusion. No pneumothorax. No acute osseous abnormality. IMPRESSION: 1. No active disease. 2.  Aortic Atherosclerosis (ICD10-I70.0). Electronically Signed   By: Morgane  Naveau M.D.   On: 05/04/2024 21:10    EKG: Independently reviewed. Atrial fibrillation, LAFB.   Assessment/Plan   1. Right hip fracture  - Based on age, renal insufficiency, and comorbid conditions, Ghadeer Kastelic is high-risk for perioperative MI or cardiac arrest but she does not have new or worsening cardiac symptoms, surgery is time-sensitive, and she can proceed without further preoperative cardiac evaluation   - Continue NPO, pain-control, and supportive care, follow-up on orthopedic surgeons' recommendations    2. Right distal ulna and radius fractures  - Short-arm splint  placed in ED and hand surgery consulted  - Continue immobilization and supportive care, follow-up on hand surgeon's recommendations    3. AKI superimposed on CKD 3B  - SCr is 2.09 on admission; baseline difficult to determine, appears  to be ~1.6    - Hold ARB and diuretics, hydrate with IVF, renally-dose medications, repeat chem panel in am   4. Chronic HFpEF  - Appears compensated  - Hold diuretics in light of AKI, monitor weight and I/Os   5. Atrial fibrillation  - No longer anticoagulated  - Continue metoprolol     6. Dementia; anxiety  - Use delirium precautions and continue Buspar , Prozac , Seroquel , and Ativan     7. Hypertension  - Continue amlodipine  and metoprolol      DVT prophylaxis: SCDs  Code Status: Full per nursing home paperwork  Level of Care: Level of care: Med-Surg Family Communication: Unable to reach patient's son who is listed as emergency contact  Disposition Plan:  Patient is from: SNF  Anticipated d/c is to: SNF  Anticipated d/c date is: 05/08/24  Patient currently: Pending surgical consultation and likely operative repair of right hip and possibly right wrist  Consults called: Hand surgery, orthopedic surgery  Admission status: Inpatient     Evalene GORMAN Sprinkles, MD [facility]  05/05/2024, 12:17 AM

## 2024-05-05 NOTE — Transfer of Care (Signed)
 Immediate Anesthesia Transfer of Care Note  Patient: Tiffany Black  Procedure(s) Performed: FIXATION, FRACTURE, INTERTROCHANTERIC, WITH INTRAMEDULLARY ROD (Right)  Patient Location: PACU  Anesthesia Type:General  Level of Consciousness: sedated  Airway & Oxygen Therapy: Patient Spontanous Breathing and Patient connected to face mask oxygen  Post-op Assessment: Report given to RN and Post -op Vital signs reviewed and stable  Post vital signs: Reviewed and stable  Last Vitals:  Vitals Value Taken Time  BP 131/62 05/05/24 18:36  Temp    Pulse 109 05/05/24 18:38  Resp 17 05/05/24 18:38  SpO2 97 % 05/05/24 18:38  Vitals shown include unfiled device data.  Last Pain:  Vitals:   05/05/24 1457  TempSrc:   PainSc: 0-No pain         Complications: No notable events documented.

## 2024-05-05 NOTE — Anesthesia Preprocedure Evaluation (Signed)
 Anesthesia Evaluation  Patient identified by MRN, date of birth, ID band Patient awake    Reviewed: Allergy & Precautions, H&P , NPO status , Patient's Chart, lab work & pertinent test results  Airway Mallampati: II   Neck ROM: full    Dental   Pulmonary shortness of breath   breath sounds clear to auscultation       Cardiovascular hypertension, +CHF   Rhythm:regular Rate:Normal     Neuro/Psych  Headaches PSYCHIATRIC DISORDERS Anxiety    Dementia TIACVA    GI/Hepatic   Endo/Other    Renal/GU Renal InsufficiencyRenal disease     Musculoskeletal  (+) Arthritis ,    Abdominal   Peds  Hematology  (+) Blood dyscrasia, anemia   Anesthesia Other Findings   Reproductive/Obstetrics                              Anesthesia Physical Anesthesia Plan  ASA: 4  Anesthesia Plan: General   Post-op Pain Management:    Induction: Intravenous  PONV Risk Score and Plan: 3 and Ondansetron , Dexamethasone  and Treatment may vary due to age or medical condition  Airway Management Planned: Oral ETT  Additional Equipment:   Intra-op Plan:   Post-operative Plan: Extubation in OR  Informed Consent: I have reviewed the patients History and Physical, chart, labs and discussed the procedure including the risks, benefits and alternatives for the proposed anesthesia with the patient or authorized representative who has indicated his/her understanding and acceptance.     Dental advisory given  Plan Discussed with: CRNA, Anesthesiologist and Surgeon  Anesthesia Plan Comments:         Anesthesia Quick Evaluation

## 2024-05-05 NOTE — Op Note (Addendum)
 Orthopedic Surgery Operative Report   Procedure: right intertrochanteric fracture open reduction internal fixation with cephalomedullary rod   Modifier: none   Date of procedure: 05/05/2024   Patient name: Tiffany Black  MRN: [id number] DOB: December 29, 1933   Surgeon: Ozell Ada, MD Assistant: none Pre-operative diagnosis: right intertrochanteric hip fracture Post-operative diagnosis: same as above Findings: right intertrochanteric femur fracture   Specimens: none Anesthesia: general EBL: 200cc Complications: none Pre-incision antibiotic: vancomycin    Implants:  Implant Name Type Inv. Item Serial No. Manufacturer Lot No. LRB No. Used Action  SCREW LAG COMPR KIT 90/85 - [id number] Screw SCREW LAG COMPR KIT 90/85  Specialty [facility] [employer] [id number] Right 1 Implanted  NAIL LESTER GAILS 10X18CM - [id number] Nail NAIL TRIGEN INTERTAN 10X18CM  [employer] [id number] Right 1 Implanted  SCREW TRIGEN LOW PROF 5.0X30 - [id number] Screw SCREW TRIGEN LOW PROF 5.0X30  [employer] [id number] Right 1 Implanted      Indication for procedure: Patient is a 89 year old female who presented to the ER after a fall. The patient had right hip pain and x-rays revealed a intertrochanteric femur fracture. The patient was admitted to a medicine service with orthopedics consulted. I met the patient and discussed the fracture. I recommended operative management in the form of intramedullary rodding to stabilize the fracture and allow for mobilization. Explained the risks of this procedure to the patient's son including, but not limited to: nonunion, malunion, fixation failure, infection, bleeding, stiffness, need for additional procedures, deep vein thrombosis, pulmonary embolism, MI, arrhythmia, and death. The alternatives of this surgery would be to treat the fracture with immobilization or to perform no intervention. After our discussion, patient elected to proceed with  surgery.    Procedure Description: The patient was met in the pre-operative holding area. The patient's identity and consent were verified. The operative site was marked by myself. The patient's and patient's son's remaining questions about the surgery were answered. The patient was brought back to the operating room. General anesthesia was induced and an endotracheal tube was placed by the anesthesia staff. The patient was transferred to the [facility] table. All bony prominences were well padded. Traction was applied and reduction was attempted with manipulation. Fluoroscopy confirmed a satisfactory reduction. The surgical area was cleansed with alcohol . Vancomycin  and TXA were administered by anesthesia. The patient's skin was then prepped and draped in a standard, sterile fashion. A time out was performed that identified the patient, the procedure, and the operative site. All team members agreed with what was stated in the time out.    An incision was made just proximal and inferior to the greater trochanter. The incision was taken sharply down through the fascia. A guide pin was inserted into the wound onto the top of the greater trochanter. Fluoroscopy was used to place the guide pin at the starting point at the tip of the greater trochanter and in line with the middle of the femoral neck. The wire was then advanced to a point just past the lesser trochanter. A soft tissue sleeve was advanced over the wire onto the greater trochanter. An entry reamer was used to open the proximal femoral canal under fluoroscopic guidance. The pin and reamer were removed. A long guide wire was placed down the femoral canal. A 10x18 nail was advanced over the guidewire under fluoroscopic guidance. The guidewire was removed.    An incision was made sharply through the skin, dermis, and fascia over the lateral thigh in  the area where the lag screws would be inserted. The lag screw targeter was placed through the jig onto the  lateral femoral cortex. A guide wire was advanced through the lag screw targeter into the femoral head under fluoroscopic guidance. It was found to be in acceptable position and well centered in the femoral head on the AP and lateral views. The length of the lag screw was estimated off of the guide wire. A 90mm screw was selected. The inferior lag screw was drilled through the guide. The derotation device was placed through the targeter. The proximal lag screw hole was then drilled over the guide wire. The screw was inserted over the wire under fluoroscopic guidance. The derotation bar was removed and the inferior lag screw was inserted. AP and lateral fluoroscopic images showed that the screws were slightly anterior but were well contained within bone. I felt that they had good purchase and revising them to a more central position may result in loss of fixation due to the previously drilled holes.    An incision was made over the distal interlocking screw of the nail. Incision was taken sharply down through the skin, dermis, and fascia. A targeter was placed through the jig onto the lateral femoral cortex. A drill was used to drill the femur bicortically through the nail. The length of the screw was estimated off the drill. A 30mm screw was selected and inserted through the femur and distal hole of the nail. The jig was removed from the nail. Final AP and lateral fluoroscopic images confirmed satisfactory reduction and position of the fixation.    The wounds were copiously irrigated with sterile saline. Vancomycin  powder was placed into each of the wounds. The fascia was closed with 0 vicryl. The deep dermal layer was closed with 2-0 vicryl. The skin was closed with staples. Dressings were applied. All counts were correct at the end of the case. Patient was transferred back to a hospital bed. The patient was awakened from anesthesia and brought back to the post-anesthesia care unit in stable condition.      Post-operative plan: The patient will recover in the post-anesthesia care unit and then go to the floor on the medicine service. The patient will receive two post-operative doses of vancomycin . She will get another dose of TXA. The patient will be weight bearing as tolerated. The patient will work with physical therapy. The patient's disposition will be determined by the medicine service.       Ozell Ada, MD Orthopedic Surgeon

## 2024-05-05 NOTE — Plan of Care (Signed)
  Problem: Education: Goal: Knowledge of General Education information will improve Description: Including pain rating scale, medication(s)/side effects and non-pharmacologic comfort measures Outcome: Progressing   Problem: Coping: Goal: Level of anxiety will decrease Outcome: Progressing   Problem: Elimination: Goal: Will not experience complications related to bowel motility Outcome: Progressing Goal: Will not experience complications related to urinary retention Outcome: Progressing   Problem: Pain Managment: Goal: General experience of comfort will improve and/or be controlled Outcome: Progressing   Problem: Safety: Goal: Ability to remain free from injury will improve Outcome: Progressing

## 2024-05-05 NOTE — ED Notes (Signed)
 Pt due to void, Pt will re-attempt to void following iv fluids bolus. This info included in report to 5N charge RN.

## 2024-05-05 NOTE — TOC CAGE-AID Note (Signed)
 Transition of Care [facility]) - CAGE-AID Screening   Patient Details  Name: Tiffany Black MRN: [id number] Date of Birth: July 24, 1934  Transition of Care [facility]) CM/SW Contact:    Maanasa Aderhold E Aailyah Dunbar, LCSW Phone Number: 05/05/2024, 10:05 AM   Clinical Narrative: No SA noted.   CAGE-AID Screening:    Have You Ever Felt You Ought to Cut Down on Your Drinking or Drug Use?: No Have People Annoyed You By Critizing Your Drinking Or Drug Use?: No Have You Felt Bad Or Guilty About Your Drinking Or Drug Use?: No Have You Ever Had a Drink or Used Drugs First Thing In The Morning to Steady Your Nerves or to Get Rid of a Hangover?: No CAGE-AID Score: 0  Substance Abuse Education Offered: No

## 2024-05-05 NOTE — Progress Notes (Signed)
 Initial Nutrition Assessment  DOCUMENTATION CODES:   Not applicable  INTERVENTION:  When pt advanced to diet after surgery today 8/25, recommend: Liberalized regular diet to provide increased options and promote adequate intake that meets increased calorie and protein needs for post op recovery Ensure Plus High Protein po BID if appetite poor after surgery to supplement calories and protein, each supplement provides 350 kcal and 20 grams of protein. MVI w/ minerals supplementation daily due to hx of vitamin deficiencies and limited intake hx  NUTRITION DIAGNOSIS:   Increased nutrient needs related to post-op healing, hip fracture as evidenced by estimated needs.  GOAL:   Patient will meet greater than or equal to 90% of their needs  MONITOR:   PO intake, Supplement acceptance  REASON FOR ASSESSMENT:   Consult Assessment of nutrition requirement/status  ASSESSMENT:   Pt with hx of dementia, CVA, heart failure, atrial fibrillation, HTN, and CKD 3. Hx thoracentesis x 3 09/2022-10/2022. Admitted from SNF after fall with R wrist and leg pain, diagnosed R hip fx and R distal ulna/radius fx.  8/24 admitted, R arm splinted 8/25 ortho consult, plan for ORIF R hip today  Spoke with pt at bedside. Pt did not seem well oriented at time of assessment. Pt complained of dry mouth and thirst multiple times and was upset she could not have anything to drink. Discussed pt's upcoming surgery and reason for NPO, pt continued to ask what surgery? And stated I will not be here at that time in regards to surgery later today. Suspect progressive dementia reason for poor orientation. Pt refused physical exam at this time due to wanting fluids. Pt unable to provide diet or wt hx at this time. Suspect dementia impacts appetite/intake, but cannot determine extent at this time.   Recommend liberalized diet and ONS post op to help promote adequate intake for increased calorie and protein needs for post  op healing.   Medications reviewed and include:  Dextrose  50% Novolog  10 units Vancomycin   Labs reviewed:  Sodium 134 Potassium 5.8 BUN 40/ Creatinine 2.13  NUTRITION - FOCUSED PHYSICAL EXAM: Defer to follow up, pt refused exam  Diet Order:   Diet Order             Diet NPO time specified Except for: Ice Chips, Sips with Meds  Diet effective now                   EDUCATION NEEDS:   Not appropriate for education at this time  Skin:  Skin Assessment: Reviewed RN Assessment  Last BM:  PTA  Height:   Ht Readings from Last 1 Encounters:  05/05/24 5' 9 (1.753 m)    Weight:   Wt Readings from Last 1 Encounters:  05/05/24 75.7 kg    Ideal Body Weight:  65.9 kg  BMI:  Body mass index is 24.65 kg/m.  Estimated Nutritional Needs:   Kcal:  1800-2000  Protein:  80-100g  Fluid:  1.8-2L    Josette Glance, MS, RDN, LDN Clinical Dietitian I Please reach out via secure chat

## 2024-05-05 NOTE — Progress Notes (Signed)
 Pt IVF infusing. Pt confused and ripped out IV (with splinted R arm) in front of RN. Pt swinging R arm and confused. Pt denies being in hospital or having fractures. Pt verbally aggressive to this RN and states she will hit anyone who touches her. Explained to pt gently that she was here for broken R hip and was having surgery and needed a new IV. Pt increasingly aggressive and states 'we' are disturbing her sleep. Multiple reorientations only increased agitation. Pt attempted to hit this RN with splinted R arm after this RN explained need for IV and looking at L arm for iv site.  Will order U/S IV and contact MD re: agitation

## 2024-05-05 NOTE — Hospital Course (Signed)
 25F resides at memory care [facility] with Hx of advanced dementia, chronic HFpEF, atrial fibrillation no longer anticoagulated, CKD 3 who presents with right wrist pain and right leg pain after a fall.  Subsequently found to have right intratrochanteric fracture, distal ulnar and radius fracture.   Assessment and Plan:   Right intertrochanteric femur fracture - Evaluated by orthopedic surgery.  Planning for surgical correction later this evening.  Currently NPO.  Transition to aspirin  81 mg twice daily postop.  Pain control on board.   Right distal ulnar/radius fracture - Short arm splint placed in the emergency department.  Ortho/hand surgery following.   Acute kidney injury on CKD 3B - Creatinine mildly elevated on presentation.  Appears somewhat stable.  IV fluids on board   Monitor urine output recheck kidney function in AM.   Mild hyperkalemia - Potassium 5.8 this morning.  Initiated D50, IV insulin , IV fluid bolus.  Will recheck BMP this afternoon.  Currently NPO for impending surgical procedure.   Atrial fibrillation - Patient does not take anticoagulation.  Continue metoprolol .   Chronic HFpEF - Holding diuretics.  Does not appear to be acutely exacerbated.   Dementia/anxiety - Pleasantly demented, does not remember falling or her injuries.  Will continue home medications once able to tolerate p.o.   Hypertension - Amlodipine , metoprolol  on board.

## 2024-05-05 NOTE — TOC Initial Note (Signed)
 Transition of Care [facility]) - Initial/Assessment Note    Patient Details  Name: Tiffany Black MRN: [id number] Date of Birth: 12-29-1933  Transition of Care [facility]) CM/SW Contact:    Nola Devere Hands, RN Phone Number: 05/05/2024, 1:02 PM  Clinical Narrative:    Patient from [facility], after a fall. Pending Surgery for ORIF of right hip this evening. TOC Team will continue to follow.                     Patient Goals and CMS Choice            Expected Discharge Plan and Services                                              Prior Living Arrangements/Services                       Activities of Daily Living   ADL Screening (condition at time of admission) Independently performs ADLs?: Yes (appropriate for developmental age) Is the patient deaf or have difficulty hearing?: No Does the patient have difficulty seeing, even when wearing glasses/contacts?: No Does the patient have difficulty concentrating, remembering, or making decisions?: Yes  Permission Sought/Granted                  Emotional Assessment              Admission diagnosis:  AKI (acute kidney injury) (HCC) [N17.9] Fall, initial encounter [W19.XXXA] Closed right hip fracture, initial encounter (HCC) [S72.001A] Displaced intertrochanteric fracture of right femur, initial encounter for closed fracture (HCC) [S72.141A] Other closed intra-articular fracture of distal end of right radius, initial encounter [S52.571A] Closed fracture of distal end of right ulna, unspecified fracture morphology, initial encounter [S52.601A] Patient Active Problem List   Diagnosis Date Noted   Closed fracture of right distal radius and ulna 05/05/2024   Closed right hip fracture, initial encounter (HCC) 05/04/2024   Need for emotional support 02/16/2023   Counseling and coordination of care 02/16/2023   Palliative care encounter 02/16/2023   CAP (community acquired pneumonia)  02/15/2023   Acute on chronic diastolic CHF (congestive heart failure) (HCC) 02/15/2023   Acute anemia 10/20/2022   Pain and swelling of right wrist 10/02/2022   Shortness of breath 09/29/2022   Multiple fractures of ribs, right side, subsequent encounter for fracture with routine healing 09/29/2022   Goals of care, counseling/discussion 09/29/2022   Recurrent right pleural effusion 09/27/2022   Dementia with behavioral disturbance (HCC) 09/27/2022   Lung nodule 09/27/2022   COVID-19 virus infection 08/27/2022   Acute respiratory failure with hypoxia (HCC) 08/27/2022   Generalized weakness 08/27/2022   Fall 08/27/2022   Hyponatremia 08/27/2022   Acute renal failure superimposed on stage 3b chronic kidney disease (HCC) 08/27/2022   Anemia of chronic disease 08/27/2022   Acute encephalopathy 08/10/2022   GAD (generalized anxiety disorder) 08/10/2022   TIA (transient ischemic attack) 08/10/2022   Aphasia 08/09/2022   Atrial fibrillation with RVR (HCC) 09/07/2021   Headache syndrome 07/02/2017   Chest pain 05/23/2015   Essential hypertension    History of stroke 05/10/2015   Dizziness 05/10/2015   Orthostatic hypotension 05/10/2015   Cerebral infarction (HCC) 05/10/2015   Hypokalemia 09/30/2013   Vertigo 02/28/2013   Sinusitis 02/28/2013   Anxiety 02/28/2013   Paroxysmal atrial  fibrillation with RVR (HCC) 02/28/2013   PAC (premature atrial contraction) 04/10/2012   Abnormal EKG 01/19/2012   Persistent atrial fibrillation (HCC) 02/15/2010   PCP:  [name], MD Pharmacy:   [facility] [phone] - [place], [place] - [address] AT [address] OF [facility] [address] [place] [address] Phone: [phone] Fax: [phone]  [id number] - [place], [place] - [address] [place] [address] Phone: [phone] Fax: [phone]  [facility] - [place], [place] - [address] [place] [address] Phone: [phone] Fax: [phone]  [facility] - [place], [place] - [place] [address] [place] [address] Phone: [phone] Fax: [phone]     Social Drivers of Health (SDOH) Social History: SDOH Screenings   Food Insecurity: No Food Insecurity (05/05/2024)  Housing: Low Risk  (05/05/2024)  Transportation Needs: No Transportation Needs (05/05/2024)  Utilities: Not At Risk (05/05/2024)  Social Connections: Unknown (05/05/2024)  Tobacco Use: Low Risk  (05/05/2024)   SDOH Interventions:     Readmission Risk Interventions    08/29/2022   10:19 AM  Readmission Risk Prevention Plan  Transportation Screening Complete  Medication Review (RN Care Manager) Complete  PCP or Specialist appointment within 3-5 days of discharge Complete  HRI or Home Care Consult Complete  SW Recovery Care/Counseling Consult Complete  Palliative Care Screening Not Applicable  Skilled Nursing Facility Complete

## 2024-05-05 NOTE — Anesthesia Procedure Notes (Signed)
 Procedure Name: Intubation Date/Time: 05/05/2024 5:32 PM  Performed by: Delores Dus, CRNAPre-anesthesia Checklist: Patient identified, Emergency Drugs available, Suction available and Patient being monitored Patient Re-evaluated:Patient Re-evaluated prior to induction Oxygen Delivery Method: Circle system utilized Preoxygenation: Pre-oxygenation with 100% oxygen Induction Type: IV induction Ventilation: Mask ventilation without difficulty Laryngoscope Size: Miller and 2 Grade View: Grade I Tube type: Oral Tube size: 7.0 mm Number of attempts: 1 Airway Equipment and Method: Stylet and Oral airway Placement Confirmation: ETT inserted through vocal cords under direct vision, positive ETCO2 and breath sounds checked- equal and bilateral Secured at: 21 cm Tube secured with: Tape Dental Injury: Teeth and Oropharynx as per pre-operative assessment

## 2024-05-06 ENCOUNTER — Encounter (HOSPITAL_COMMUNITY): Payer: Self-pay | Admitting: Orthopedic Surgery

## 2024-05-06 DIAGNOSIS — F03918 Unspecified dementia, unspecified severity, with other behavioral disturbance: Secondary | ICD-10-CM | POA: Diagnosis not present

## 2024-05-06 DIAGNOSIS — N179 Acute kidney failure, unspecified: Secondary | ICD-10-CM | POA: Diagnosis not present

## 2024-05-06 DIAGNOSIS — E875 Hyperkalemia: Secondary | ICD-10-CM | POA: Diagnosis not present

## 2024-05-06 DIAGNOSIS — S72001A Fracture of unspecified part of neck of right femur, initial encounter for closed fracture: Secondary | ICD-10-CM | POA: Diagnosis not present

## 2024-05-06 DIAGNOSIS — S72141A Displaced intertrochanteric fracture of right femur, initial encounter for closed fracture: Secondary | ICD-10-CM

## 2024-05-06 LAB — BASIC METABOLIC PANEL WITH GFR
Anion gap: 11 (ref 5–15)
Anion gap: 16 — ABNORMAL HIGH (ref 5–15)
BUN: 43 mg/dL — ABNORMAL HIGH (ref 8–23)
BUN: 50 mg/dL — ABNORMAL HIGH (ref 8–23)
CO2: 16 mmol/L — ABNORMAL LOW (ref 22–32)
CO2: 13 mmol/L — ABNORMAL LOW (ref 22–32)
Calcium: 8.8 mg/dL — ABNORMAL LOW (ref 8.9–10.3)
Calcium: 8.4 mg/dL — ABNORMAL LOW (ref 8.9–10.3)
Chloride: 107 mmol/L (ref 98–111)
Chloride: 105 mmol/L (ref 98–111)
Creatinine, Ser: 2.35 mg/dL — ABNORMAL HIGH (ref 0.44–1.00)
Creatinine, Ser: 2.73 mg/dL — ABNORMAL HIGH (ref 0.44–1.00)
GFR, Estimated: 19 mL/min — ABNORMAL LOW (ref >60)
GFR, Estimated: 16 mL/min — ABNORMAL LOW (ref >60)
Glucose, Bld: 162 mg/dL — ABNORMAL HIGH (ref 70–99)
Glucose, Bld: 135 mg/dL — ABNORMAL HIGH (ref 70–99)
Potassium: 6.2 mmol/L — ABNORMAL HIGH (ref 3.5–5.1)
Potassium: 5.8 mmol/L — ABNORMAL HIGH (ref 3.5–5.1)
Sodium: 134 mmol/L — ABNORMAL LOW (ref 135–145)
Sodium: 134 mmol/L — ABNORMAL LOW (ref 135–145)

## 2024-05-06 LAB — CBC
HCT: 25.8 % — ABNORMAL LOW (ref 36.0–46.0)
Hemoglobin: 8.3 g/dL — ABNORMAL LOW (ref 12.0–15.0)
MCH: 31.7 pg (ref 26.0–34.0)
MCHC: 32.2 g/dL (ref 30.0–36.0)
MCV: 98.5 fL (ref 80.0–100.0)
Platelets: 124 K/uL — ABNORMAL LOW (ref 150–400)
RBC: 2.62 MIL/uL — ABNORMAL LOW (ref 3.87–5.11)
RDW: 13.1 % (ref 11.5–15.5)
WBC: 12.7 K/uL — ABNORMAL HIGH (ref 4.0–10.5)
nRBC: 0 % (ref 0.0–0.2)

## 2024-05-06 MED ORDER — SODIUM BICARBONATE 4.2 % IV SOLN
50.0000 meq | Freq: Once | INTRAVENOUS | Status: DC
  Filled 2024-05-06: qty 100

## 2024-05-06 MED ORDER — OXYCODONE HCL 5 MG PO TABS
5.0000 mg | ORAL_TABLET | ORAL | Status: DC | PRN
  Administered 2024-05-06 – 2024-05-14 (×13): 5 mg via ORAL
  Filled 2024-05-06 (×13): qty 1

## 2024-05-06 MED ORDER — INSULIN ASPART 100 UNIT/ML IV SOLN
10.0000 [IU] | Freq: Once | INTRAVENOUS | Status: AC
  Administered 2024-05-06: 10 [IU] via INTRAVENOUS

## 2024-05-06 MED ORDER — DEXTROSE 50 % IV SOLN
1.0000 | Freq: Once | INTRAVENOUS | Status: AC
  Administered 2024-05-06: 50 mL via INTRAVENOUS
  Filled 2024-05-06: qty 50

## 2024-05-06 MED ORDER — ASPIRIN 81 MG PO CHEW
81.0000 mg | CHEWABLE_TABLET | Freq: Two times a day (BID) | ORAL | Status: DC
  Administered 2024-05-06 – 2024-05-14 (×16): 81 mg via ORAL
  Filled 2024-05-06 (×16): qty 1

## 2024-05-06 MED ORDER — TRANEXAMIC ACID-NACL 1000-0.7 MG/100ML-% IV SOLN
1000.0000 mg | Freq: Once | INTRAVENOUS | Status: DC
  Filled 2024-05-06: qty 100

## 2024-05-06 MED ORDER — SODIUM CHLORIDE 0.9 % IV BOLUS
1000.0000 mL | Freq: Once | INTRAVENOUS | Status: AC
  Administered 2024-05-06: 1000 mL via INTRAVENOUS

## 2024-05-06 MED ORDER — SODIUM BICARBONATE 8.4 % IV SOLN
50.0000 meq | Freq: Once | INTRAVENOUS | Status: AC
  Administered 2024-05-06: 50 meq via INTRAVENOUS
  Filled 2024-05-06: qty 50

## 2024-05-06 MED ORDER — CALCIUM GLUCONATE-NACL 1-0.675 GM/50ML-% IV SOLN
1.0000 g | Freq: Once | INTRAVENOUS | Status: AC
  Administered 2024-05-06: 1000 mg via INTRAVENOUS
  Filled 2024-05-06: qty 50

## 2024-05-06 MED ORDER — SODIUM ZIRCONIUM CYCLOSILICATE 10 G PO PACK
10.0000 g | PACK | Freq: Once | ORAL | Status: AC
  Administered 2024-05-06: 10 g via ORAL
  Filled 2024-05-06: qty 1

## 2024-05-06 NOTE — Progress Notes (Signed)
 Orthopedic Surgery Progress Note   Assessment: Patient is a 89 y.o. female with right intertrochanteric femur fracture status post CMN   Plan: -Operative plans: complete -Diet: cardiac -DVT ppx: aspirin  81mg  BID -Antibiotics: vancomycin  x2 post-op doses -Weight bearing status: As tolerated -Distal radius fracture per hand surgery -PT evaluate and treat -Pain control -Dispo: per primary  ___________________________________________________________________________  Subjective: No acute events overnight.  Is confused this morning.  Wanting to see her son.   Physical Exam:  General: no acute distress, appears stated age Neurologic: alert, answering questions appropriately, following commands Respiratory: unlabored breathing on room air, symmetric chest rise Psychiatric: appropriate affect, normal cadence to speech  MSK:   -Right lower extremity  Dressings over hip CDI  EHL/TA/GSC intact Plantarflexes and dorsiflexes toes Sensation intact to light touch in sural, saphenous, tibial, deep peroneal, and superficial peroneal nerve distributions Foot warm and well perfused   Yesterday's total administered Morphine  Milligram Equivalents: 67.5   Patient name: Tiffany Black Patient MRN: [id number] Date: 05/06/24

## 2024-05-06 NOTE — Discharge Instructions (Addendum)
 Orthopedic Surgery Discharge Instructions  Patient name: Tiffany Black Fracture: Right intertrochanteric femur fracture Procedure Performed: Right hip cephalomedullary nail Date of Surgery: 05/05/2024 Surgeon: Ozell Ada, MD  Activity: You are allowed to put as much weight on your leg as you would like. You can walk as much as you would like. You can perform household activities such as cleaning dishes, doing laundry, vacuuming, etc.  Incision Care: Your incision site has a dressing over it. That dressing should remain in place and dry at all times for a total of one week after surgery. After one week, you can remove the dressing. Underneath the dressing, you will find skin staples. You should leave these staples in place. They will be taken out in the office when the wound has healed. Do not pick, rub, or scrub at them. Do not put cream or lotion over the surgical area. After one week and once the dressing is off, it is okay to let soap and water  run over your incision. Again, do not pick, scrub, or rub at the staples when bathing. Do not submerge (e.g., take a bath, swim, go in a hot tub, etc.) until six weeks after surgery. There may be some bloody drainage from the incision into the dressing after surgery. This is normal. You do not need to replace the dressing. Continue to leave it in place for the one week as instructed above. Should the dressing become saturated with blood or drainage, please call the office for further instructions.   Medications: You have been prescribed oxycodone . This is a narcotic pain medication and should only be taken as prescribed. You should not drink alcohol  or operate heavy machinery (including driving) while taking this medication. The oxycodone  can cause constipation as a side effect. For that reason, you have been prescribed senna and miralax . These are both laxatives. You do not need to take this medication if you develop diarrhea. Should you remain  constipated even while taking the senna and miralax , please use the miralax  twice daily. Tylenol  has been prescribed to be taken every 8 hours, which will give you additional pain relief.   You have been prescribed aspirin  as a blood thinner. This medication is to be taken to prevent blood clots. Take 81 milligrams twice daily. You should refrain from using other blood thinners (warfarin, apixaban, plavix, xarelto , etc.) while using the aspirin . You will need to take this medication for a total of 6 weeks after your surgery.   You should not use over-the-counter NSAIDs (ibuprofen , Aleve, Celebrex, naproxen, meloxicam, etc.) for pain relief because aspirin  is a similar medication. There can be side effects including but not limited to kidney injury and ulcers if you take these type of medications with the aspirin .  In order to set expectations for opioid prescriptions, you will only be prescribed opioids for a total of six weeks after surgery and, at two-weeks after surgery, your opioid prescription will start to tapered (decreased dosage and number of pills). If you have ongoing need for opioid medication six weeks after surgery, you will be referred to pain management. If you are already established with a provider that is giving you opioid medications, you should schedule an appointment with them for six weeks after surgery if you feel you are going to need another prescription. State law only allows for opioid prescriptions one week at a time. If you are running out of opioid medication near the end of the week, please call the office during business hours before  running out so I can send you another prescription.   Diet: You are safe to resume your regular diet after surgery.   Reasons to Call the Office After Surgery: You should feel free to call the office with any concerns or questions you have in the post-operative period, but you should definitely notify the office if you develop: -shortness of  breath, chest pain, or trouble breathing -excessive bleeding, drainage, redness, or swelling around the surgical site -fevers, chills, or pain that is getting worse with each passing day -persistent nausea or vomiting -new weakness in the right lower extremity, new or worsening numbness or tingling in the right lower extremity -numbness in the groin, bowel or bladder incontinence -other concerns about your surgery  Follow Up Appointments: You have a follow up appointment scheduled with Dr. Georgina on 05/22/2024 at 3:45pm. The office location and phone number are listed below. Please arrive on time to your appointment.   Office Information:  -Ozell Georgina, MD -Phone number: [phone] -Address: [address]       [place], [place] [address]

## 2024-05-06 NOTE — Evaluation (Signed)
 Physical Therapy Evaluation Patient Details Name: Tiffany Black MRN: [id number] DOB: 10/14/1933 Today's Date: 05/06/2024  History of Present Illness  Pt is an 89 y/o F presenting to ED on 8/24 from SNF after fall, found to have R intertrochanteric femur fx and R distal ulnar/radius fx. S/p R femur ORIF, R distal radius fx managed in splint. PMH includes CVA, HTN, dementia, anxiety, chronic HFpEF, A fib, CKD III  Clinical Impression  Pt admitted with above diagnosis. Pt from memory care per chart. Lethargic on eval, kept eyes closed throughout session despite discomfort with mobility. Did not follow commands and cried out with pain with mvmt of RLE. Came to sitting EOB with max A +2 with L lean away from injured hip. Maintained less than 10 mins before leaning harder in attempt to lie back down. SPO2 noted to be 85% on RA, 1L O2 placed and sats increased to 95%. HR 122 bpm. Patient will benefit from continued inpatient follow up therapy, <3 hours/day.  Pt currently with functional limitations due to the deficits listed below (see PT Problem List). Pt will benefit from acute skilled PT to increase their independence and safety with mobility to allow discharge.           If plan is discharge home, recommend the following: Two people to help with walking and/or transfers;Two people to help with bathing/dressing/bathroom;Assistance with feeding;Assistance with cooking/housework;Assist for transportation;Help with stairs or ramp for entrance;Direct supervision/assist for financial management;Direct supervision/assist for medications management;Supervision due to cognitive status   Can travel by private vehicle   No    Equipment Recommendations Wheelchair (measurements PT);Wheelchair cushion (measurements PT)  Recommendations for Other Services       Functional Status Assessment Patient has had a recent decline in their functional status and demonstrates the ability to make significant  improvements in function in a reasonable and predictable amount of time.     Precautions / Restrictions Precautions Precautions: Fall Recall of Precautions/Restrictions: Intact Restrictions Weight Bearing Restrictions Per Provider Order: Yes RLE Weight Bearing Per Provider Order: Weight bearing as tolerated Other Position/Activity Restrictions: treated RUE as NWB in splint, though no formal orders      Mobility  Bed Mobility Overal bed mobility: Needs Assistance Bed Mobility: Supine to Sit, Sit to Supine     Supine to sit: Max assist, +2 for physical assistance Sit to supine: Max assist, +2 for physical assistance   General bed mobility comments: max +2 via helicopter method to pivot to EOB. Pt maintained L lean in sitting and tolerated upright less than 10 mins before leaning heavily to L to attempt to return to bed    Transfers                   General transfer comment: deferred 2/2 lethargy    Ambulation/Gait               General Gait Details: deferred  Stairs            Wheelchair Mobility     Tilt Bed    Modified Rankin (Stroke Patients Only)       Balance Overall balance assessment: Needs assistance Sitting-balance support: Feet supported Sitting balance-Leahy Scale: Zero Sitting balance - Comments: L lateral lean to offload R hip, max A to maintain sitting Postural control: Left lateral lean  Pertinent Vitals/Pain Pain Assessment Pain Assessment: Faces Faces Pain Scale: Hurts even more Pain Location: RLE Pain Descriptors / Indicators: Discomfort Pain Intervention(s): Monitored during session, Limited activity within patient's tolerance    Home Living Family/patient expects to be discharged to:: Assisted living                 Home Equipment: Agricultural consultant (2 wheels);Cane - single point Additional Comments: pt [facility]    Prior Function Prior Level  of Function : Needs assist             Mobility Comments: per chart pt was able to transfer with staff. Pt unable to state ADLs Comments: assist needed     Extremity/Trunk Assessment   Upper Extremity Assessment Upper Extremity Assessment: Defer to OT evaluation RUE Deficits / Details: RUE immobilized in splint, moving LUE  > RUE, decr response to touch/pain to RUE RUE Sensation: decreased light touch;decreased proprioception RUE Coordination: decreased fine motor;decreased gross motor    Lower Extremity Assessment Lower Extremity Assessment: RLE deficits/detail;Generalized weakness;Difficult to assess due to impaired cognition RLE Deficits / Details: pt cries out with passive mvmt of RLE, does not move knee or ankle on command.    Cervical / Trunk Assessment Cervical / Trunk Assessment: Kyphotic  Communication   Communication Communication: No apparent difficulties    Cognition Arousal: Obtunded Behavior During Therapy: Flat affect   PT - Cognitive impairments: History of cognitive impairments                       PT - Cognition Comments: dementia baseline. Pt kept eyes closed throughout session despite expressing pain. Following commands: Impaired Following commands impaired: Follows one step commands inconsistently     Cueing Cueing Techniques: Verbal cues, Gestural cues     General Comments General comments (skin integrity, edema, etc.): SPO2 noted to be 85% on RA. 1 L O2 donned and SPO2 increased to 95%. HR 122 bpm after sitting up. No family present    Exercises     Assessment/Plan    PT Assessment Patient needs continued PT services  PT Problem List Decreased strength;Decreased range of motion;Decreased activity tolerance;Decreased balance;Decreased cognition;Decreased mobility;Decreased coordination;Decreased knowledge of use of DME;Decreased knowledge of precautions;Decreased safety awareness;Pain       PT Treatment Interventions Functional  mobility training;Therapeutic activities;Therapeutic exercise;Balance training;Patient/family education;Cognitive remediation;Neuromuscular re-education;DME instruction    PT Goals (Current goals can be found in the Care Plan section)  Acute Rehab PT Goals Patient Stated Goal: none stated PT Goal Formulation: Patient unable to participate in goal setting Time For Goal Achievement: 05/20/24 Potential to Achieve Goals: Fair    Frequency Min 1X/week     Co-evaluation PT/OT/SLP Co-Evaluation/Treatment: Yes Reason for Co-Treatment: Complexity of the patient's impairments (multi-system involvement);Necessary to address cognition/behavior during functional activity;To address functional/ADL transfers PT goals addressed during session: Mobility/safety with mobility OT goals addressed during session: ADL's and self-care;Strengthening/ROM       AM-PAC PT 6 Clicks Mobility  Outcome Measure Help needed turning from your back to your side while in a flat bed without using bedrails?: Total Help needed moving from lying on your back to sitting on the side of a flat bed without using bedrails?: Total Help needed moving to and from a bed to a chair (including a wheelchair)?: Total Help needed standing up from a chair using your arms (e.g., wheelchair or bedside chair)?: Total Help needed to walk in hospital room?: Total Help needed climbing 3-5  steps with a railing? : Total 6 Click Score: 6    End of Session   Activity Tolerance: Patient limited by pain Patient left: in bed;with bed alarm set;with call bell/phone within reach Nurse Communication: Mobility status PT Visit Diagnosis: Pain;History of falling (Z91.81);Difficulty in walking, not elsewhere classified (R26.2) Pain - Right/Left: Right Pain - part of body: Hip    Time: 8560-8497 PT Time Calculation (min) (ACUTE ONLY): 23 min   Charges:   PT Evaluation $PT Eval Moderate Complexity: 1 Mod   PT General Charges $$ ACUTE PT  VISIT: 1 Visit         Richerd Lipoma, PT  Acute Rehab Services Secure chat preferred Office [phone]   Richerd CROME Kimora Stankovic 05/06/2024, 4:32 PM

## 2024-05-06 NOTE — Plan of Care (Signed)
   Problem: Clinical Measurements: Goal: Will remain free from infection Outcome: Progressing   Problem: Pain Managment: Goal: General experience of comfort will improve and/or be controlled Outcome: Progressing   Problem: Safety: Goal: Ability to remain free from injury will improve Outcome: Progressing

## 2024-05-06 NOTE — Evaluation (Signed)
 Occupational Therapy Evaluation Patient Details Name: Tiffany Black MRN: [id number] DOB: [birth year] Today's Date: 05/06/2024   History of Present Illness   Pt is an 89 y/o F presenting to ED on 8/24 from SNF after fall, found to have R intertrochanteric femur fx and R distal ulnar/radius fx. S/p R femur ORIF, R distal radius fx managed in splint. PMH includes CVA, HTN, dementia, anxiety, chronic HFpEF, A fib, CKD III     Clinical Impressions Unsure of PLOF, assume assist for ADLs/mobility as pt from SNF, pt needing up to max A for ADLs, max +2 for bed mobility and pt with incr pain and heavy L lateral lean once sitting EOB to offload painful R hip. Pt keeping eyes closed throughout session, no command follow, using LUE >RUE. Pt desatting to 85% on RA, incr to 95% with 1L O2 donned. RN aware. Pt presenting with impairments listed below, will follow acutely. Patient will benefit from continued inpatient follow up therapy, <3 hours/day to maximize safety/ind with ADL/functional mobility.      If plan is discharge home, recommend the following:   Two people to help with walking and/or transfers;Two people to help with bathing/dressing/bathroom;Assistance with cooking/housework;Direct supervision/assist for medications management;Direct supervision/assist for financial management;Help with stairs or ramp for entrance;Supervision due to cognitive status;Assist for transportation     Functional Status Assessment   Patient has had a recent decline in their functional status and demonstrates the ability to make significant improvements in function in a reasonable and predictable amount of time.     Equipment Recommendations   Other (comment) (defer)     Recommendations for Other Services   PT consult     Precautions/Restrictions   Precautions Precautions: Fall Recall of Precautions/Restrictions: Intact Restrictions Weight Bearing Restrictions Per Provider Order: Yes RLE  Weight Bearing Per Provider Order: Weight bearing as tolerated Other Position/Activity Restrictions: treated RUE as NWB in splint, though no formal orders     Mobility Bed Mobility Overal bed mobility: Needs Assistance Bed Mobility: Supine to Sit, Sit to Supine     Supine to sit: Max assist, +2 for physical assistance Sit to supine: Max assist, +2 for physical assistance   General bed mobility comments: max +2 via helicopter method to pivot to EOB    Transfers                   General transfer comment: deferred 2/2 lethargy      Balance Overall balance assessment: Needs assistance Sitting-balance support: Feet supported Sitting balance-Leahy Scale: Zero Sitting balance - Comments: L lateral lean to offload R hip                                   ADL either performed or assessed with clinical judgement   ADL Overall ADL's : Needs assistance/impaired Eating/Feeding: Sitting;Minimal assistance;Bed level   Grooming: Moderate assistance;Sitting;Bed level   Upper Body Bathing: Maximal assistance   Lower Body Bathing: Maximal assistance   Upper Body Dressing : Maximal assistance   Lower Body Dressing: Maximal assistance   Toilet Transfer: Maximal assistance;+2 for physical assistance   Toileting- Clothing Manipulation and Hygiene: Maximal assistance       Functional mobility during ADLs: Maximal assistance;+2 for physical assistance       Vision   Additional Comments: will further assess     Perception Perception: Not tested       Praxis Praxis: Not tested  Pertinent Vitals/Pain Pain Assessment Pain Assessment: Faces Pain Score: 5  Faces Pain Scale: Hurts even more Pain Location: RLE Pain Descriptors / Indicators: Discomfort Pain Intervention(s): Limited activity within patient's tolerance, Monitored during session, Repositioned     Extremity/Trunk Assessment Upper Extremity Assessment Upper Extremity Assessment: RUE  deficits/detail RUE Deficits / Details: RUE immobilized in splint, moving LUE  > RUE, decr response to touch/pain to RUE RUE Sensation: decreased light touch;decreased proprioception RUE Coordination: decreased fine motor;decreased gross motor   Lower Extremity Assessment Lower Extremity Assessment: Defer to PT evaluation   Cervical / Trunk Assessment Cervical / Trunk Assessment: Kyphotic   Communication Communication Communication: No apparent difficulties   Cognition Arousal: Obtunded Behavior During Therapy: Flat affect Cognition: No family/caregiver present to determine baseline             OT - Cognition Comments: Pt keeping eyes closed, no command follow, grimaces to pain with mobility                 Following commands: Intact       Cueing  General Comments   Cueing Techniques: Verbal cues;Gestural cues  VSS   Exercises     Shoulder Instructions      Home Living Family/patient expects to be discharged to:: Skilled nursing facility                                        Prior Functioning/Environment Prior Level of Function : Needs assist             Mobility Comments: unsure of PLOF ADLs Comments: unsure of PLOF    OT Problem List: Decreased strength;Decreased range of motion;Decreased activity tolerance;Impaired balance (sitting and/or standing);Decreased cognition;Decreased safety awareness;Cardiopulmonary status limiting activity   OT Treatment/Interventions: Self-care/ADL training;Therapeutic exercise;Energy conservation;DME and/or AE instruction;Therapeutic activities;Patient/family education;Balance training      OT Goals(Current goals can be found in the care plan section)   Acute Rehab OT Goals Patient Stated Goal: none stated OT Goal Formulation: With patient Time For Goal Achievement: 05/20/24 Potential to Achieve Goals: Good ADL Goals Pt Will Perform Upper Body Dressing: sitting;with contact guard  assist Pt Will Perform Lower Body Dressing: sitting/lateral leans;sit to/from stand;with mod assist Pt Will Transfer to Toilet: stand pivot transfer;bedside commode;with mod assist   OT Frequency:  Min 2X/week    Co-evaluation PT/OT/SLP Co-Evaluation/Treatment: Yes Reason for Co-Treatment: Complexity of the patient's impairments (multi-system involvement);Necessary to address cognition/behavior during functional activity;To address functional/ADL transfers   OT goals addressed during session: ADL's and self-care;Strengthening/ROM      AM-PAC OT 6 Clicks Daily Activity     Outcome Measure Help from another person eating meals?: A Lot Help from another person taking care of personal grooming?: A Lot Help from another person toileting, which includes using toliet, bedpan, or urinal?: A Lot Help from another person bathing (including washing, rinsing, drying)?: A Lot Help from another person to put on and taking off regular upper body clothing?: A Lot Help from another person to put on and taking off regular lower body clothing?: A Lot 6 Click Score: 12   End of Session Equipment Utilized During Treatment: Oxygen (1L O2) Nurse Communication: Mobility status  Activity Tolerance: Patient limited by fatigue;Patient limited by lethargy Patient left: in bed;with call bell/phone within reach;with bed alarm set  OT Visit Diagnosis: Unsteadiness on feet (R26.81);Other abnormalities of gait and mobility (R26.89);Muscle weakness (generalized) (  M62.81);History of falling (Z91.81)                Time: 8557-8498 OT Time Calculation (min): 19 min Charges:  OT General Charges $OT Visit: 1 Visit OT Evaluation $OT Eval Low Complexity: 1 Low  Laneta POUR, OTD, OTR/L SecureChat Preferred Acute Rehab [phone]   Laneta POUR Koonce 05/06/2024, 3:55 PM

## 2024-05-06 NOTE — Progress Notes (Signed)
 Orthopedic Tech Progress Note Patient Details:  Tiffany Black 08-Feb-1934 [id number]  Ortho Devices Type of Ortho Device: Velcro wrist forearm splint Ortho Device/Splint Location: RUE Ortho Device/Splint Interventions: Ordered, Application, Adjustment   Post Interventions Patient Tolerated: Well Instructions Provided: Care of device  Delanna LITTIE Pac 05/06/2024, 2:12 PM

## 2024-05-06 NOTE — Progress Notes (Signed)
 Progress Note   Patient: Tiffany Black FMW:[id number] DOB: 10-22-1933 DOA: 05/04/2024  DOS: the patient was seen and examined on 05/06/2024   Brief hospital course:  40F resides at [facility] with Hx of advanced dementia, chronic HFpEF, atrial fibrillation no longer anticoagulated, CKD 3 who presents with right wrist pain and right leg pain after a fall.  Subsequently found to have right intratrochanteric fracture, distal ulnar and radius fracture.   Assessment and Plan:   Right intertrochanteric femur fracture - Evaluated by orthopedic surgery.  POD #1 CMN.  Transition to aspirin  81 mg twice daily postop.  Pain control on board.   Right distal ulnar/radius fracture - Imaging noting acute intra-articular, comminuted, and displaced distal radius metadiaphysis fracture and displaced and comminuted distal ulnar fracture.  Short arm splint placed in the emergency department, however appears pt inadvertently removed it late last night/early this morning.  Will reach out to Ortho hand (different from Ortho hip) ongoing recommendations.    acute kidney injury on CKD 3B - Creatinine mildly elevated on presentation.  Appears somewhat stable.  IV fluids on board.  Monitor urine output recheck bmp in AM.   Mild hyperkalemia - Potassium 6.2 this morning.  Initiated calcium  gluconate D50, IV insulin , IV fluid bolus, Lokelma .  Will recheck BMP this afternoon.     .Atrial fibrillation - Patient does not take anticoagulation.  Continue metoprolol .   Chronic HFpEF - Holding diuretics.  Does not appear to be acutely exacerbated.   Dementia/anxiety - Pleasantly demented, does not remember falling or her injuries.  Will continue home medications once able to tolerate p.o.   Hypertension - Amlodipine , metoprolol  on board.   Subjective: Patient resting comfortably this morning.  Reportedly had self removed her right arm/wrist braced.  Very tender.  Denies shortness of breath, fever,  nausea, vomiting.  2023.  Pleasantly demented.  Physical Exam:  Vitals:   05/05/24 2243 05/06/24 0401 05/06/24 0704 05/06/24 1210  BP: 115/75 (!) 164/97  (!) 130/107  Pulse: 84 60  98  Resp: 16 16  18   Temp: 97.6 F (36.4 C) 98.5 F (36.9 C)  97.9 F (36.6 C)  TempSrc: Axillary   Axillary  SpO2: 97% 92%  95%  Weight:   82.5 kg   Height:        GENERAL:  Alert, pleasant, no acute distress HEENT:  EOMI CARDIOVASCULAR:  RRR, no murmurs appreciated RESPIRATORY:  Clear to auscultation, no wheezing, rales, or rhonchi GASTROINTESTINAL:  Soft, nontender, nondistended EXTREMITIES: RUE not in splint, fresh bandage over right lateral hip NEURO:  No new focal deficits appreciated SKIN:  No rashes noted PSYCH: Pleasantly demented   Data Reviewed:  Imaging Studies: DG HIP UNILAT WITH PELVIS 1V RIGHT Result Date: 05/06/2024 CLINICAL DATA:  Status post right hip fixation EXAM: DG HIP (WITH OR WITHOUT PELVIS) 1V RIGHT COMPARISON:  Films from earlier in the same day. FINDINGS: Proximal medullary rod with distal fixation is noted. Two fixation screws traverse the femoral neck. No soft tissue abnormality is noted. IMPRESSION: Status post ORIF right proximal femoral fracture. Electronically Signed   By: Oneil Devonshire M.D.   On: 05/06/2024 01:31   DG FEMUR, MIN 2 VIEWS RIGHT Result Date: 05/05/2024 CLINICAL DATA:  Fracture, postop. EXAM: RIGHT FEMUR 2 VIEWS COMPARISON:  Preoperative imaging. FINDINGS: Femoral intramedullary nail with trans trochanteric and distal locking screw fixation traverse proximal femur fracture. Persistent displacement of the lesser trochanteric fragment. No periprosthetic lucency. The distal femur is intact. Recent postsurgical  change includes air and edema in the soft tissues with skin staples laterally. IMPRESSION: ORIF of proximal femur fracture. No immediate postoperative complication. Electronically Signed   By: Andrea Gasman M.D.   On: 05/05/2024 20:11   DG HIP  UNILAT WITH PELVIS 2-3 VIEWS RIGHT Result Date: 05/05/2024 CLINICAL DATA:  Elective surgery. EXAM: DG HIP (WITH OR WITHOUT PELVIS) 2-3V RIGHT COMPARISON:  Radiograph yesterday FINDINGS: Seven fluoroscopic spot views of the right proximal femur and hip submitted from the operating room. Femoral intramedullary nail with trans trochanteric and distal locking screw fixation traverse proximal femur fracture. Fluoroscopy time 1 minutes 3 seconds. Dose 12.93 mGy. IMPRESSION: Intraoperative fluoroscopy during proximal femur fracture ORIF. Electronically Signed   By: Andrea Gasman M.D.   On: 05/05/2024 20:10   DG C-Arm 1-60 Min-No Report Result Date: 05/05/2024 Fluoroscopy was utilized by the requesting physician.  No radiographic interpretation.   DG Wrist Complete Right Result Date: 05/04/2024 CLINICAL DATA:  pain after fall EXAM: RIGHT WRIST - COMPLETE 3+ VIEW COMPARISON:  None Available. FINDINGS: Diffusely decreased bone density. Acute intra-articular, comminuted, and displaced distal radial metadiaphysis fracture. Question anterior dislocation of the radius in relation to the carpal bones. Acute displaced and comminuted distal ulnar fracture. Scaphoid degenerative changes. Subcutaneus soft tissue edema. IMPRESSION: 1. Acute intra-articular, comminuted, and displaced distal radial metadiaphysis fracture. 2. Acute displaced and comminuted distal ulnar fracture. 3. Question anterior dislocation of the radius in relation to the carpal bones. Electronically Signed   By: Morgane  Naveau M.D.   On: 05/04/2024 21:15   DG Tibia/Fibula Right Result Date: 05/04/2024 CLINICAL DATA:  pain after fall EXAM: RIGHT TIBIA AND FIBULA - 2 VIEW COMPARISON:  None Available. FINDINGS: There is no evidence of fracture or other focal bone lesions. Knee and ankle grossly unremarkable. Soft tissues are unremarkable. Vascular calcification. IMPRESSION: Negative for acute traumatic injury. Electronically Signed   By: Morgane  Naveau  M.D.   On: 05/04/2024 21:13   DG Humerus Right Result Date: 05/04/2024 CLINICAL DATA:  pain after fall EXAM: RIGHT HUMERUS - 2+ VIEW COMPARISON:  None Available. FINDINGS: There is no evidence of fracture or other focal bone lesions. Soft tissues are unremarkable. IMPRESSION: Negative. Electronically Signed   By: Morgane  Naveau M.D.   On: 05/04/2024 21:12   DG Hip Unilat W or Wo Pelvis 2-3 Views Right Result Date: 05/04/2024 CLINICAL DATA:  pain after fall EXAM: DG HIP (WITH OR WITHOUT PELVIS) 2-3V RIGHT COMPARISON:  None Available. FINDINGS: Acute displaced and comminuted right femoral intertrochanteric fracture. No right hip dislocation. No acute displaced fracture or dislocation of left hip. No acute displaced fracture or diastasis of the bones of the pelvis. There is no evidence of arthropathy or other focal bone abnormality. IMPRESSION: Acute displaced and comminuted right femoral intertrochanteric fracture. Electronically Signed   By: Morgane  Naveau M.D.   On: 05/04/2024 21:12   DG Chest Port 1 View Result Date: 05/04/2024 CLINICAL DATA:  pain after fall EXAM: PORTABLE CHEST 1 VIEW COMPARISON:  Chest x-ray 05/01/2023 FINDINGS: The heart and mediastinal contours are unchanged. Atherosclerotic plaque. No focal consolidation. No pulmonary edema. No pleural effusion. No pneumothorax. No acute osseous abnormality. IMPRESSION: 1. No active disease. 2.  Aortic Atherosclerosis (ICD10-I70.0). Electronically Signed   By: Morgane  Naveau M.D.   On: 05/04/2024 21:10    There are no new results to review at this time.  Previous records (including but not limited to H&P, progress notes, nursing notes, TOC management) were reviewed in assessment  of this patient.  Labs: CBC: Recent Labs  Lab 05/04/24 2023 05/05/24 0625 05/05/24 1523 05/06/24 0600  WBC 8.2 11.3*  --  12.7*  NEUTROABS 6.0  --   --   --   HGB 13.4 11.5* 10.9* 8.3*  HCT 40.7 35.1* 32.0* 25.8*  MCV 95.3 95.9  --  98.5  PLT 224 167   --  124*   Basic Metabolic Panel: Recent Labs  Lab 05/04/24 2023 05/05/24 0625 05/05/24 1523 05/05/24 2002 05/06/24 0600  NA 134* 134* 136 135 134*  K 4.6 5.8* 5.0 5.4* 6.2*  CL 100 104 106 106 107  CO2 19* 21*  --  21* 16*  GLUCOSE 145* 142* 131* 193* 162*  BUN 40* 40* 35* 35* 43*  CREATININE 2.09* 2.13* 1.70* 1.76* 2.35*  CALCIUM  9.9 9.3  --  8.9 8.8*   Liver Function Tests: No results for input(s): AST, ALT, ALKPHOS, BILITOT, PROT, ALBUMIN  in the last 168 hours. CBG: No results for input(s): GLUCAP in the last 168 hours.  Scheduled Meds:  acetaminophen   1,000 mg Oral Q8H   amLODipine   10 mg Oral Daily   aspirin   81 mg Oral BID   busPIRone   7.5 mg Oral TID   FLUoxetine   10 mg Oral Daily   LORazepam   0.5 mg Oral Q1200   metoprolol  tartrate  25 mg Oral BID   QUEtiapine   50 mg Oral QHS   Continuous Infusions:  calcium  gluconate 1,000 mg (05/06/24 1207)   tranexamic acid      vancomycin  1,000 mg (05/06/24 0841)   PRN Meds:.fentaNYL  (SUBLIMAZE ) injection, LORazepam , methocarbamol , OLANZapine , ondansetron  (ZOFRAN ) IV, oxyCODONE   Family Communication: none at bedside  Disposition: Status is: Inpatient Remains inpatient appropriate because: multiple fractures, hyperkalemia     Time spent: 50 minutes  Length of inpatient stay: 2 days  Author: Carliss LELON Canales, DO 05/06/2024 12:55 PM  For on call review [email or web address].

## 2024-05-07 ENCOUNTER — Inpatient Hospital Stay (HOSPITAL_COMMUNITY)

## 2024-05-07 DIAGNOSIS — S72001A Fracture of unspecified part of neck of right femur, initial encounter for closed fracture: Secondary | ICD-10-CM | POA: Diagnosis not present

## 2024-05-07 LAB — BASIC METABOLIC PANEL WITH GFR
Anion gap: 13 (ref 5–15)
BUN: 58 mg/dL — ABNORMAL HIGH (ref 8–23)
CO2: 19 mmol/L — ABNORMAL LOW (ref 22–32)
Calcium: 8.8 mg/dL — ABNORMAL LOW (ref 8.9–10.3)
Chloride: 104 mmol/L (ref 98–111)
Creatinine, Ser: 3.24 mg/dL — ABNORMAL HIGH (ref 0.44–1.00)
GFR, Estimated: 13 mL/min — ABNORMAL LOW (ref >60)
Glucose, Bld: 107 mg/dL — ABNORMAL HIGH (ref 70–99)
Potassium: 5.2 mmol/L — ABNORMAL HIGH (ref 3.5–5.1)
Sodium: 136 mmol/L (ref 135–145)

## 2024-05-07 LAB — CBC
HCT: 20.7 % — ABNORMAL LOW (ref 36.0–46.0)
Hemoglobin: 6.6 g/dL — CL (ref 12.0–15.0)
MCH: 31.6 pg (ref 26.0–34.0)
MCHC: 31.9 g/dL (ref 30.0–36.0)
MCV: 99 fL (ref 80.0–100.0)
Platelets: 94 K/uL — ABNORMAL LOW (ref 150–400)
RBC: 2.09 MIL/uL — ABNORMAL LOW (ref 3.87–5.11)
RDW: 13.2 % (ref 11.5–15.5)
WBC: 13.4 K/uL — ABNORMAL HIGH (ref 4.0–10.5)
nRBC: 0 % (ref 0.0–0.2)

## 2024-05-07 LAB — PREPARE RBC (CROSSMATCH)

## 2024-05-07 LAB — HEMOGLOBIN AND HEMATOCRIT, BLOOD
HCT: 22.8 % — ABNORMAL LOW (ref 36.0–46.0)
Hemoglobin: 7.6 g/dL — ABNORMAL LOW (ref 12.0–15.0)

## 2024-05-07 MED ORDER — SODIUM CHLORIDE 0.9 % IV SOLN: INTRAVENOUS | Status: AC

## 2024-05-07 MED ORDER — SODIUM CHLORIDE 0.9 % IV BOLUS
500.0000 mL | Freq: Once | INTRAVENOUS | Status: AC
  Administered 2024-05-07: 500 mL via INTRAVENOUS

## 2024-05-07 MED ORDER — SENNOSIDES-DOCUSATE SODIUM 8.6-50 MG PO TABS
1.0000 | ORAL_TABLET | Freq: Every evening | ORAL | Status: DC | PRN
  Administered 2024-05-11 – 2024-05-12 (×2): 1 via ORAL
  Filled 2024-05-07 (×3): qty 1

## 2024-05-07 MED ORDER — IPRATROPIUM-ALBUTEROL 0.5-2.5 (3) MG/3ML IN SOLN: 3.0000 mL | RESPIRATORY_TRACT | Status: DC | PRN

## 2024-05-07 MED ORDER — SODIUM ZIRCONIUM CYCLOSILICATE 10 G PO PACK
10.0000 g | PACK | Freq: Three times a day (TID) | ORAL | Status: AC
  Administered 2024-05-07 (×3): 10 g via ORAL
  Filled 2024-05-07 (×3): qty 1

## 2024-05-07 MED ORDER — ASPIRIN 81 MG PO CHEW: 81.0000 mg | CHEWABLE_TABLET | Freq: Two times a day (BID) | ORAL | 0 refills | Status: AC

## 2024-05-07 MED ORDER — SODIUM CHLORIDE 0.9% IV SOLUTION: Freq: Once | INTRAVENOUS | Status: AC

## 2024-05-07 MED ORDER — MIDODRINE HCL 5 MG PO TABS
10.0000 mg | ORAL_TABLET | Freq: Once | ORAL | Status: AC
  Administered 2024-05-07: 10 mg via ORAL
  Filled 2024-05-07: qty 2

## 2024-05-07 MED ORDER — GUAIFENESIN 100 MG/5ML PO LIQD: 5.0000 mL | ORAL | Status: DC | PRN

## 2024-05-07 MED ORDER — SENNOSIDES-DOCUSATE SODIUM 8.6-50 MG PO TABS: 1.0000 | ORAL_TABLET | Freq: Two times a day (BID) | ORAL | 0 refills | Status: AC

## 2024-05-07 MED ORDER — OXYCODONE HCL 5 MG PO TABS: 5.0000 mg | ORAL_TABLET | ORAL | 0 refills | Status: AC | PRN

## 2024-05-07 MED ORDER — POLYETHYLENE GLYCOL 3350 17 G PO PACK: 17.0000 g | PACK | Freq: Every day | ORAL | 0 refills | Status: AC

## 2024-05-07 MED ORDER — WITCH HAZEL-GLYCERIN EX PADS: MEDICATED_PAD | CUTANEOUS | Status: DC | PRN

## 2024-05-07 MED ORDER — ACETAMINOPHEN 500 MG PO TABS: 1000.0000 mg | ORAL_TABLET | Freq: Three times a day (TID) | ORAL | 0 refills | Status: AC

## 2024-05-07 MED ORDER — METOPROLOL TARTRATE 5 MG/5ML IV SOLN: 5.0000 mg | INTRAVENOUS | Status: DC | PRN

## 2024-05-07 MED ORDER — HYDRALAZINE HCL 20 MG/ML IJ SOLN: 10.0000 mg | INTRAMUSCULAR | Status: DC | PRN

## 2024-05-07 NOTE — Consult Note (Cosign Needed Addendum)
 Reason for Consult:Right wrist fx Referring Physician: Burgess Dare Time called: 9051 Time at bedside: [address] Tiffany Black is an 89 y.o. female.  HPI: Tiffany Black fell over the weekend at the [facility] where she [facility]. She was found to have a right wrist fx and hand surgery was consulted. She is RHD but demented and cannot reliably contribute to history.  Past Medical History:  Diagnosis Date   Anxiety    Arthritis    BPPV (benign paroxysmal positional vertigo)    Breast lesion    left   Bulging of cervical intervertebral disc    Cardiomegaly    Cataracts, bilateral    Chronic neck pain    CVA (cerebral vascular accident) (HCC)    x2 2013 and 2015; right temporal lobe and left frontal lobe infarct-> dzziness which resolved; occluded right PCA, moderate posterior left cerebral stenosis, left MCA stenosis on MRA   Dementia [facility])    Essential hypertension    History of endometrial cancer    History of stroke    2015 and 2016 // carotid US  8/16: Bilateral 1-39 // carotid US  2/16: Bilateral ICA 1-39   HLD (hyperlipidemia)    Hx of echocardiogram    a. Echo (11/15): Mild LVH, EF 55-60%, normal wall motion, grade 2 diastolic dysfunction, mild LAE, normal RV function, PASP 29 mm Hg // echo 8/16:Mild concentric LVH, EF 55-60, normal wall motion, grade 2 diastolic dysfunction, mild LAE, PASP 39   Hypertension    IBS (irritable bowel syndrome)    Insomnia    Lung nodule    OA (osteoarthritis)    Pectus excavatum    Persistent atrial fibrillation (HCC)    Pleural effusion    right recurrent, 1/24 s/p thoracentesis x 3 (cytology negative)- Dr Kara   Pneumonia due to COVID-19 virus    Radius fracture    left distal   Vertigo    Vitamin D deficiency     Past Surgical History:  Procedure Laterality Date   ABDOMINAL HYSTERECTOMY  2011   CATARACT EXTRACTION, BILATERAL Bilateral 2009   CESAREAN SECTION  1975   INTRAMEDULLARY (IM) NAIL INTERTROCHANTERIC Right 05/05/2024   Procedure:  FIXATION, FRACTURE, INTERTROCHANTERIC, WITH INTRAMEDULLARY ROD;  Surgeon: Georgina Ozell LABOR, MD;  Location: [facility] OR;  Service: Orthopedics;  Laterality: Right;   IR THORACENTESIS ASP PLEURAL SPACE W/IMG GUIDE  09/28/2022   IR THORACENTESIS ASP PLEURAL SPACE W/IMG GUIDE  10/23/2022   LAPAROSCOPIC HYSTERECTOMY  2011   THORACENTESIS Right 11/03/2022   Procedure: THORACENTESIS;  Surgeon: Gladis Leonor HERO, MD;  Location: [facility] ENDOSCOPY;  Service: Pulmonary;  Laterality: Right;    Family History  Problem Relation Age of Onset   Stroke Mother    Heart attack Father    Heart disease Father    Dementia Sister    CAD Brother    Stroke Maternal Uncle    Hypertension Neg Hx     Social History:  reports that she has never smoked. She has never used smokeless tobacco. She reports that she does not drink alcohol  and does not use drugs.  Allergies:  Allergies  Allergen Reactions   Hydrochlorothiazide Hives   Penicillins Anaphylaxis and Hives   Cipro  [Ciprofloxacin  Hcl] Other (See Comments)    blood pressure issues Unknown if medication increased or decreased blood pressure   Iodinated Contrast Media Diarrhea, Other (See Comments) and Hypertension    Elevated the B/P   Iodine-131 Other (See Comments) and Hypertension    Raised the blood  pressure   Lasix [Furosemide] Other (See Comments)    Unknown reaction   Latex Itching   Lipitor [Atorvastatin] Other (See Comments)    Myopathy    Lortab [Hydrocodone -Acetaminophen ] Other (See Comments)    Unknown reaction   Niaspan [Niacin] Other (See Comments)    Flu-like symptoms   Statins Other (See Comments)    Myopathy   Toprol  Xl [Metoprolol ] Other (See Comments)    Unknown reaction to metoprolol  succinate only, no reaction to tartrate   Vibramycin [Doxycycline] Diarrhea and Nausea And Vomiting   Bactrim [Sulfamethoxazole-Trimethoprim] Hives and Rash   Codeine Nausea And Vomiting   Nickel Rash   Sulfa Antibiotics Hives and Rash   Sulfonamide  Derivatives Hives and Rash        Tegretol [Carbamazepine] Rash, Other (See Comments) and Hypertension    flushed blood pressure medication out of system    Medications: I have reviewed the patient's current medications.  Results for orders placed or performed during the hospital encounter of 05/04/24 (from the past 48 hours)  Type and screen [facility]     Status: None (Preliminary result)   Collection Time: 05/05/24  2:59 PM  Result Value Ref Range   ABO/RH(D) A POS    Antibody Screen NEG    Sample Expiration 05/08/2024,2359    Unit Number [id number]    Blood Component Type RED CELLS,LR    Unit division 00    Status of Unit ISSUED    Transfusion Status OK TO TRANSFUSE    Crossmatch Result      Compatible Performed at [facility], [address], [place], [place] [address]   I-STAT, nathanael 8     Status: Abnormal   Collection Time: 05/05/24  3:23 PM  Result Value Ref Range   Sodium 136 135 - 145 mmol/L   Potassium 5.0 3.5 - 5.1 mmol/L   Chloride 106 98 - 111 mmol/L   BUN 35 (H) 8 - 23 mg/dL   Creatinine, Ser 8.29 (H) 0.44 - 1.00 mg/dL   Glucose, Bld 868 (H) 70 - 99 mg/dL    Comment: Glucose reference range applies only to samples taken after fasting for at least 8 hours.   Calcium , Ion 1.22 1.15 - 1.40 mmol/L   TCO2 21 (L) 22 - 32 mmol/L   Hemoglobin 10.9 (L) 12.0 - 15.0 g/dL   HCT 67.9 (L) 63.9 - 53.9 %  Basic metabolic panel     Status: Abnormal   Collection Time: 05/05/24  8:02 PM  Result Value Ref Range   Sodium 135 135 - 145 mmol/L   Potassium 5.4 (H) 3.5 - 5.1 mmol/L   Chloride 106 98 - 111 mmol/L   CO2 21 (L) 22 - 32 mmol/L   Glucose, Bld 193 (H) 70 - 99 mg/dL    Comment: Glucose reference range applies only to samples taken after fasting for at least 8 hours.   BUN 35 (H) 8 - 23 mg/dL   Creatinine, Ser 8.23 (H) 0.44 - 1.00 mg/dL   Calcium  8.9 8.9 - 10.3 mg/dL   GFR, Estimated 27 (L) >60 mL/min    Comment: (NOTE) Calculated using  the CKD-EPI Creatinine Equation (2021)    Anion gap 8 5 - 15    Comment: Performed at [facility], [address] [place], [place] [address]  CBC     Status: Abnormal   Collection Time: 05/06/24  6:00 AM  Result Value Ref Range   WBC  12.7 (H) 4.0 - 10.5 K/uL   RBC 2.62 (L) 3.87 - 5.11 MIL/uL   Hemoglobin 8.3 (L) 12.0 - 15.0 g/dL    Comment: REPEATED TO VERIFY Result Called to, Read Back by and Verified with: ALISA FOWLER, RN    HCT 25.8 (L) 36.0 - 46.0 %   MCV 98.5 80.0 - 100.0 fL   MCH 31.7 26.0 - 34.0 pg   MCHC 32.2 30.0 - 36.0 g/dL   RDW 86.8 88.4 - 84.4 %   Platelets 124 (L) 150 - 400 K/uL   nRBC 0.0 0.0 - 0.2 %    Comment: Performed at [facility], [address], [place], [place] [address]  Basic metabolic panel     Status: Abnormal   Collection Time: 05/06/24  6:00 AM  Result Value Ref Range   Sodium 134 (L) 135 - 145 mmol/L   Potassium 6.2 (H) 3.5 - 5.1 mmol/L   Chloride 107 98 - 111 mmol/L   CO2 16 (L) 22 - 32 mmol/L   Glucose, Bld 162 (H) 70 - 99 mg/dL    Comment: Glucose reference range applies only to samples taken after fasting for at least 8 hours.   BUN 43 (H) 8 - 23 mg/dL   Creatinine, Ser 7.64 (H) 0.44 - 1.00 mg/dL   Calcium  8.8 (L) 8.9 - 10.3 mg/dL   GFR, Estimated 19 (L) >60 mL/min    Comment: (NOTE) Calculated using the CKD-EPI Creatinine Equation (2021)    Anion gap 11 5 - 15    Comment: Performed at [facility], [address], [place], [place] [address]  Basic metabolic panel     Status: Abnormal   Collection Time: 05/06/24  2:10 PM  Result Value Ref Range   Sodium 134 (L) 135 - 145 mmol/L   Potassium 5.8 (H) 3.5 - 5.1 mmol/L   Chloride 105 98 - 111 mmol/L   CO2 13 (L) 22 - 32 mmol/L   Glucose, Bld 135 (H) 70 - 99 mg/dL    Comment: Glucose reference range applies only to samples taken after fasting for at least 8 hours.   BUN 50 (H) 8 - 23 mg/dL   Creatinine, Ser 7.26 (H) 0.44 - 1.00 mg/dL   Calcium  8.4 (L) 8.9 - 10.3  mg/dL   GFR, Estimated 16 (L) >60 mL/min    Comment: (NOTE) Calculated using the CKD-EPI Creatinine Equation (2021)    Anion gap 16 (H) 5 - 15    Comment: Performed at [facility], [address], [place], [place] [address]  CBC     Status: Abnormal   Collection Time: 05/07/24  5:01 AM  Result Value Ref Range   WBC 13.4 (H) 4.0 - 10.5 K/uL   RBC 2.09 (L) 3.87 - 5.11 MIL/uL   Hemoglobin 6.6 (LL) 12.0 - 15.0 g/dL    Comment: REPEATED TO VERIFY This critical result has been called to GLYN AGENT, RN by Ezzie Custodio on 05/07/2024 06:20:25, and has been read back.    HCT 20.7 (L) 36.0 - 46.0 %   MCV 99.0 80.0 - 100.0 fL   MCH 31.6 26.0 - 34.0 pg   MCHC 31.9 30.0 - 36.0 g/dL   RDW 86.7 88.4 - 84.4 %   Platelets 94 (L) 150 - 400 K/uL    Comment: REPEATED TO VERIFY SPECIMEN CHECKED FOR CLOTS PLATELET COUNT CONFIRMED BY SMEAR Immature Platelet Fraction may be clinically indicated, consider ordering this additional test [id number]  nRBC 0.0 0.0 - 0.2 %    Comment: Performed at [facility], [address] [place], [place] [address]  Basic metabolic panel     Status: Abnormal   Collection Time: 05/07/24  5:01 AM  Result Value Ref Range   Sodium 136 135 - 145 mmol/L   Potassium 5.2 (H) 3.5 - 5.1 mmol/L   Chloride 104 98 - 111 mmol/L   CO2 19 (L) 22 - 32 mmol/L   Glucose, Bld 107 (H) 70 - 99 mg/dL    Comment: Glucose reference range applies only to samples taken after fasting for at least 8 hours.   BUN 58 (H) 8 - 23 mg/dL   Creatinine, Ser 6.75 (H) 0.44 - 1.00 mg/dL   Calcium  8.8 (L) 8.9 - 10.3 mg/dL   GFR, Estimated 13 (L) >60 mL/min    Comment: (NOTE) Calculated using the CKD-EPI Creatinine Equation (2021)    Anion gap 13 5 - 15    Comment: Performed at [facility], [address], [place], [place] [address]  Prepare RBC (crossmatch)     Status: None   Collection Time: 05/07/24  7:34 AM  Result Value Ref Range   Order Confirmation      ORDER  PROCESSED BY BLOOD BANK Performed at [facility], [address], [place], [place] [address]     DG HIP UNILAT WITH PELVIS 1V RIGHT Result Date: 05/06/2024 CLINICAL DATA:  Status post right hip fixation EXAM: DG HIP (WITH OR WITHOUT PELVIS) 1V RIGHT COMPARISON:  Films from earlier in the same day. FINDINGS: Proximal medullary rod with distal fixation is noted. Two fixation screws traverse the femoral neck. No soft tissue abnormality is noted. IMPRESSION: Status post ORIF right proximal femoral fracture. Electronically Signed   By: Oneil Devonshire M.D.   On: 05/06/2024 01:31   DG FEMUR, MIN 2 VIEWS RIGHT Result Date: 05/05/2024 CLINICAL DATA:  Fracture, postop. EXAM: RIGHT FEMUR 2 VIEWS COMPARISON:  Preoperative imaging. FINDINGS: Femoral intramedullary nail with trans trochanteric and distal locking screw fixation traverse proximal femur fracture. Persistent displacement of the lesser trochanteric fragment. No periprosthetic lucency. The distal femur is intact. Recent postsurgical change includes air and edema in the soft tissues with skin staples laterally. IMPRESSION: ORIF of proximal femur fracture. No immediate postoperative complication. Electronically Signed   By: Andrea Gasman M.D.   On: 05/05/2024 20:11   DG HIP UNILAT WITH PELVIS 2-3 VIEWS RIGHT Result Date: 05/05/2024 CLINICAL DATA:  Elective surgery. EXAM: DG HIP (WITH OR WITHOUT PELVIS) 2-3V RIGHT COMPARISON:  Radiograph yesterday FINDINGS: Seven fluoroscopic spot views of the right proximal femur and hip submitted from the operating room. Femoral intramedullary nail with trans trochanteric and distal locking screw fixation traverse proximal femur fracture. Fluoroscopy time 1 minutes 3 seconds. Dose 12.93 mGy. IMPRESSION: Intraoperative fluoroscopy during proximal femur fracture ORIF. Electronically Signed   By: Andrea Gasman M.D.   On: 05/05/2024 20:10   DG C-Arm 1-60 Min-No Report Result Date: 05/05/2024 Fluoroscopy was  utilized by the requesting physician.  No radiographic interpretation.    Review of Systems  Unable to perform ROS: Dementia   Blood pressure 95/70, pulse 91, temperature 98.2 F (36.8 C), resp. rate 18, height 5' 9 (1.753 m), weight 82.5 kg, SpO2 96%. Physical Exam Constitutional:      General: She is not in acute distress.    Appearance: She is well-developed. She is not diaphoretic.  HENT:     Head: Normocephalic  and atraumatic.  Eyes:     General: No scleral icterus.       Right eye: No discharge.        Left eye: No discharge.     Conjunctiva/sclera: Conjunctivae normal.  Cardiovascular:     Rate and Rhythm: Normal rate and regular rhythm.  Pulmonary:     Effort: Pulmonary effort is normal. No respiratory distress.  Musculoskeletal:     Cervical back: Normal range of motion.     Comments: Right shoulder, elbow, wrist, digits- no skin wounds, severe TTP wrist, splint in bed with pt, no instability, no blocks to motion  Sens  Ax/R/M/U intact  Mot   Ax/ R/ PIN/ M/ AIN/ U intact  Rad 2+  Skin:    General: Skin is warm and dry.  Neurological:     Mental Status: She is alert.  Psychiatric:        Mood and Affect: Mood normal.        Behavior: Behavior normal.     Assessment/Plan: Right wrist fx -- Plan ORIF Saturday with Dr. Romona. Please keep NPO after MN Friday.    Ozell DOROTHA Ned, PA-C Orthopedic Surgery [phone] 05/07/2024, 10:44 AM     Agree with above.  I spoke with patient's son, Manuela Halbur, at length about his mom's injury and the various available treatment options.  We specifically discussed non operative management with a cast versus open reduction and internal fixation.  We reviewed the pros and cons of each treatment approach.  We discussed that the risks of surgery would include bleeding, infection, damage to neurovascular structures, nonunion, malunion, symptomatic hardware, persistent pain or swelling, stiffness, delayed wound healing,  and need for additional surgery.  Patient is demented but independent with most of her daily activities such as hygiene and dressing herself.  As such, he has elected to proceed with open reduction and internal fixation rather than conservative management.  Questions were encouraged and answered.  We will plan to proceed with surgery tomorrow.  Bebe Romona, M.D. [facility]

## 2024-05-07 NOTE — Hospital Course (Addendum)
 Tiffany Black is a 89 y.o. year old female with PMH of  of advanced dementia, chronic HFpEF, atrial fibrillation no longer anticoagulated, CKD 3 who presents with right wrist pain and right leg pain after a fall.  Subsequently found to have right intratrochanteric fracture, distal ulnar and radius fracture who underwent ORIF for the right femur fracture on 8/25,underwent ORIF for her right hand on 8/30. During the hospitalization due to acute blood loss anemia required PRBC transfusion.  Hospital course also complicated by AKI resolving with IV fluids.  PT OT recommending SNF,waiting placement.  8 Discharge pending bowel movement. She had BM today and being discharged  ADMIT DATE: 05/04/2024 DISCHAREGE DATE: 05/14/2024  Subjective: Seen and examined today Alert awake will tell me her name and that she is in [facility] Splint in place on right arm Overnight afebrile BP stable on room air Labs/hemoglobin at 8.3 g 9/2 She has been refusing vitals this morning, has been combative, last night ripped her cast/splint off   Assessment and plan:  Right intertrochanteric femur fracture s/p ORIF 8/25 Outpatient orthopedic inputs.  Now on aspirin  81 twice daily for DVT prophylaxis.  Continue multimodal pain management, continue PT OT and SNF placement   Right distal ulnar/radius fracture S/p ORIF by orthopedic 8/30.  Outpatient follow-up in 10-14 days.  Plan to keep the splint clean and dry. Hand surgery aware, patient with dementia at risk for splint removal, patient is to see hand surgery in 10 to 14 days Continue nonweightbearing and maintain splint until then   Constipation: Ordering Dulcolax suppository, continue bowel regimen  AKI on CKD 3B Hyperkalemia-resolved. Baseline creatinine 1.3, initially trended up to 1.4 now at baseline. Renal US  NAD   ABLA: Baseline hemoglobin 9.0, postop anemia after both of her surgeries.Status post 1 unit PRBC 8/27, 9/2.  Monitor intermittently    Permanent atrial fibrillation Rate controlled continue metoprolol .  Not on anticoagulation at baseline.     Chronic HFpEF Euvolemic. holding diuretics post op with AKI   Dementia/anxiety Pleasantly demented, does not remember falling or her injuries.  Continue delirium precaution, fall precaution Continue home Prozac , BuSpar , lorazepam , Seroquel .   Hypertension Well-controlled on this.  Norvasc , metoprolol .  IV as needed  Mobility: PT Orders: Active  PT Follow up Rec: Skilled Nursing-Short Term Rehab (<3 Hours/Day) (Or Return To [facility] If Staff Can Give Tot A)05/13/2024 1059   DVT prophylaxis: SCDs Start: 05/05/24 0015 Code Status:   Code Status: Full Code Family Communication: plan of care discussed with patient at bedside. Patient status is: Remains hospitalized because of severity of illness Level of care: Med-Surg   Dispo:Anticipated disposition: snf once he has bowel movement  Objective: Vitals last 24 hrs: Vitals:   05/13/24 0827 05/13/24 1524 05/13/24 1600 05/13/24 2058  BP: 125/71 (!) 111/55 112/60 (!) 116/102  Pulse:   94 (!) 106  Resp: 16  16 17   Temp: 98.9 F (37.2 C)  98.8 F (37.1 C) 97.9 F (36.6 C)  TempSrc:   Oral Oral  SpO2: 97%  96% 95%  Weight:      Height:        Physical Examination: General exam: alert awake, pleasantly confused, HEENT:Oral mucosa moist, Ear/Nose WNL grossly Respiratory system: Bilaterally clear BS,no use of accessory muscle Cardiovascular system: S1 & S2 +, No JVD. Gastrointestinal system: Abdomen soft,NT,ND, BS+ Nervous System: Alert, awake, moving all extremities,and following commands. Extremities: LE edema neg, distal extremities warm.  Right arm with splint in place Skin: No rashes,no icterus.  MSK: Normal muscle bulk,tone, power   Medications reviewed:  Scheduled Meds:  acetaminophen   1,000 mg Oral Q8H   amLODipine   10 mg Oral Daily   aspirin   81 mg Oral BID   busPIRone   7.5 mg Oral TID   Chlorhexidine   Gluconate Cloth  6 each Topical Daily   feeding supplement  237 mL Oral BID BM   FLUoxetine   10 mg Oral Daily   LORazepam   0.5 mg Oral Q1200   metoprolol  tartrate  25 mg Oral BID   pneumococcal 20-valent conjugate vaccine  0.5 mL Intramuscular Tomorrow-1000   QUEtiapine   50 mg Oral QHS   Continuous Infusions:  tranexamic acid      Diet: Diet Order             Diet regular Room service appropriate? No; Fluid consistency: Thin  Diet effective now

## 2024-05-07 NOTE — NC FL2 (Signed)
 [place]  MEDICAID FL2 LEVEL OF CARE FORM     IDENTIFICATION  Patient Name: Tiffany Black Birthdate: 1934/01/01 Sex: female Admission Date (Current Location): 05/04/2024  [facility] and [place] Number:  Producer, television/film/video and Address:  [facility], [address], [place], [place] [address]      Provider Number: [id number]  Attending Physician Name and Address:  Caleen Burgess BROCKS, MD  Relative Name and Phone Number:  Bette, Brienza [phone]    Current Level of Care: Hospital Recommended Level of Care: Skilled Nursing Facility Prior Approval Number:    Date Approved/Denied:   PASRR Number: [id number]  Discharge Plan: SNF    Current Diagnoses: Patient Active Problem List   Diagnosis Date Noted   Displaced intertrochanteric fracture of right femur, initial encounter for closed fracture (HCC) 05/06/2024   Closed fracture of right distal radius and ulna 05/05/2024   Closed right hip fracture, initial encounter (HCC) 05/04/2024   Need for emotional support 02/16/2023   Counseling and coordination of care 02/16/2023   Palliative care encounter 02/16/2023   CAP (community acquired pneumonia) 02/15/2023   Acute on chronic diastolic CHF (congestive heart failure) (HCC) 02/15/2023   Acute anemia 10/20/2022   Pain and swelling of right wrist 10/02/2022   Shortness of breath 09/29/2022   Multiple fractures of ribs, right side, subsequent encounter for fracture with routine healing 09/29/2022   Goals of care, counseling/discussion 09/29/2022   Recurrent right pleural effusion 09/27/2022   Dementia with behavioral disturbance (HCC) 09/27/2022   Lung nodule 09/27/2022   COVID-19 virus infection 08/27/2022   Acute respiratory failure with hypoxia (HCC) 08/27/2022   Generalized weakness 08/27/2022   Fall 08/27/2022   Hyponatremia 08/27/2022   Acute renal failure superimposed on stage 3b chronic kidney disease (HCC)  08/27/2022   Anemia of chronic disease 08/27/2022   Acute encephalopathy 08/10/2022   GAD (generalized anxiety disorder) 08/10/2022   TIA (transient ischemic attack) 08/10/2022   Aphasia 08/09/2022   Atrial fibrillation with RVR (HCC) 09/07/2021   Headache syndrome 07/02/2017   Chest pain 05/23/2015   Essential hypertension    History of stroke 05/10/2015   Dizziness 05/10/2015   Orthostatic hypotension 05/10/2015   Cerebral infarction (HCC) 05/10/2015   Hypokalemia 09/30/2013   Vertigo 02/28/2013   Sinusitis 02/28/2013   Anxiety 02/28/2013   Paroxysmal atrial fibrillation with RVR (HCC) 02/28/2013   PAC (premature atrial contraction) 04/10/2012   Abnormal EKG 01/19/2012   Persistent atrial fibrillation (HCC) 02/15/2010    Orientation RESPIRATION BLADDER Height & Weight     Self  Normal Incontinent, External catheter Weight: 181 lb 14.1 oz (82.5 kg) Height:  5' 9 (175.3 cm)  BEHAVIORAL SYMPTOMS/MOOD NEUROLOGICAL BOWEL NUTRITION STATUS        Diet (see discharge summary)  AMBULATORY STATUS COMMUNICATION OF NEEDS Skin   Total Care Verbally Surgical wounds                       Personal Care Assistance Level of Assistance  Bathing, Feeding, Dressing Bathing Assistance: Maximum assistance Feeding assistance: Limited assistance Dressing Assistance: Maximum assistance     Functional Limitations Info  Sight, Hearing, Speech Sight Info: Adequate Hearing Info: Adequate Speech Info: Adequate    SPECIAL CARE FACTORS FREQUENCY  PT (By licensed PT), OT (By licensed OT)     PT Frequency: 5x week OT Frequency: 5x week            Contractures Contractures  Info: Not present    Additional Factors Info  Code Status, Allergies Code Status Info: full Allergies Info: Hydrochlorothiazide, Penicillins, Cipro  (Ciprofloxacin  Hcl), Iodinated Contrast Media, Iodine-131, Lasix (Furosemide), Latex, Lipitor (Atorvastatin), Lortab (Hydrocodone -acetaminophen ), Niaspan (Niacin),  Statins, Toprol  Xl (Metoprolol ), Vibramycin (Doxycycline), Bactrim (Sulfamethoxazole-trimethoprim), Codeine, Nickel, Sulfa Antibiotics, Sulfonamide Derivatives, Tegretol (Carbamazepine)           Current Medications (05/07/2024):  This is the current hospital active medication list Current Facility-Administered Medications  Medication Dose Route Frequency Provider Last Rate Last Admin   0.9 %  sodium chloride  infusion   Intravenous Continuous Amin, Ankit C, MD 75 mL/hr at 05/07/24 0900 New Bag at 05/07/24 0900   acetaminophen  (TYLENOL ) tablet 1,000 mg  1,000 mg Oral Q8H Moore, Michael A, MD   1,000 mg at 05/07/24 1256   amLODipine  (NORVASC ) tablet 10 mg  10 mg Oral Daily Moore, Michael A, MD   10 mg at 05/07/24 1033   aspirin  chewable tablet 81 mg  81 mg Oral BID Moore, Michael A, MD   81 mg at 05/07/24 1033   busPIRone  (BUSPAR ) tablet 7.5 mg  7.5 mg Oral TID Moore, Michael A, MD   7.5 mg at 05/07/24 1518   fentaNYL  (SUBLIMAZE ) injection 25-50 mcg  25-50 mcg Intravenous Q2H PRN Moore, Michael A, MD   50 mcg at 05/06/24 0445   FLUoxetine  (PROZAC ) capsule 10 mg  10 mg Oral Daily Moore, Michael A, MD   10 mg at 05/07/24 1033   guaiFENesin  (ROBITUSSIN) 100 MG/5ML liquid 5 mL  5 mL Oral Q4H PRN Amin, Ankit C, MD       hydrALAZINE  (APRESOLINE ) injection 10 mg  10 mg Intravenous Q4H PRN Amin, Ankit C, MD       ipratropium-albuterol  (DUONEB) 0.5-2.5 (3) MG/3ML nebulizer solution 3 mL  3 mL Nebulization Q4H PRN Amin, Ankit C, MD       LORazepam  (ATIVAN ) tablet 0.5 mg  0.5 mg Oral Q1200 Moore, Michael A, MD   0.5 mg at 05/07/24 1256   LORazepam  (ATIVAN ) tablet 0.5 mg  0.5 mg Oral Q8H PRN Georgina Ozell LABOR, MD       methocarbamol  (ROBAXIN ) tablet 500 mg  500 mg Oral Q6H PRN Moore, Michael A, MD   500 mg at 05/07/24 9546   metoprolol  tartrate (LOPRESSOR ) injection 5 mg  5 mg Intravenous Q4H PRN Amin, Ankit C, MD       metoprolol  tartrate (LOPRESSOR ) tablet 25 mg  25 mg Oral BID Moore, Michael A, MD   25 mg  at 05/06/24 2103   OLANZapine  (ZYPREXA ) injection 2.5 mg  2.5 mg Intramuscular Once PRN Georgina Ozell LABOR, MD       ondansetron  (ZOFRAN ) injection 4 mg  4 mg Intravenous Q6H PRN Moore, Michael A, MD   4 mg at 05/05/24 2023   oxyCODONE  (Oxy IR/ROXICODONE ) immediate release tablet 5 mg  5 mg Oral Q4H PRN Moore, Michael A, MD   5 mg at 05/07/24 1033   QUEtiapine  (SEROQUEL ) tablet 50 mg  50 mg Oral QHS Moore, Michael A, MD   50 mg at 05/06/24 2104   senna-docusate (Senokot-S) tablet 1 tablet  1 tablet Oral QHS PRN Amin, Ankit C, MD       sodium zirconium cyclosilicate  (LOKELMA ) packet 10 g  10 g Oral TID Amin, Ankit C, MD   10 g at 05/07/24 1518   tranexamic acid  (CYKLOKAPRON ) IVPB 1,000 mg  1,000 mg Intravenous Once Georgina Ozell LABOR, MD  Discharge Medications: Please see discharge summary for a list of discharge medications.  Relevant Imaging Results:  Relevant Lab Results:   Additional Information SSN [id number]  Bridget Cordella Simmonds, LCSW

## 2024-05-07 NOTE — Progress Notes (Signed)
 Orthopedic Tech Progress Note Patient Details:  Tiffany Black [birth year] [id number]  Ortho Devices Type of Ortho Device: Sugartong splint Ortho Device/Splint Location: RUE Ortho Device/Splint Interventions: Ordered, Application, Adjustment Patient seemed to be confused as to why splint was being applied, explained that splint was being applied for possible fracture. Post Interventions Patient Tolerated: Fair Instructions Provided: Other (comment)  [name] 05/07/2024, 11:30 AM

## 2024-05-07 NOTE — Progress Notes (Signed)
 PROGRESS NOTE    Tiffany Black  FMW:[id number] DOB: 09-13-1933 DOA: 05/04/2024 PCP: Tiffany Elsie [name], MD    Brief Narrative:   [phone] resides at memory care [facility] with Hx of advanced dementia, chronic HFpEF, atrial fibrillation no longer anticoagulated, CKD 3 who presents with right wrist pain and right leg pain after a fall.  Subsequently found to have right intratrochanteric fracture, distal ulnar and radius fracture.  Patient underwent ORIF for the right femur fracture on 8/25.  Hand surgery was consulted.  Assessment & Plan:  Principal Problem:   Closed right hip fracture, initial encounter [facility]) Active Problems:   Anxiety   Dementia with behavioral disturbance (HCC)   Persistent atrial fibrillation (HCC)   Acute renal failure superimposed on stage 3b chronic kidney disease (HCC)   Closed fracture of right distal radius and ulna   Displaced intertrochanteric fracture of right femur, initial encounter for closed fracture (HCC)    Right intertrochanteric femur fracture s/p ORIF 8/25 - Evaluated by orthopedic surgery.Transition to aspirin  81 mg twice daily postop.  Pain control on board.   Right distal ulnar/radius fracture - Imaging noting acute intra-articular, comminuted, and displaced distal radius metadiaphysis fracture and displaced and comminuted distal ulnar fracture.  Sugar-tong splint.  Ortho reconsulted by me this morning, seeing the patient today.   acute kidney injury on CKD 3B Hyperkalemia - Baseline creatinine 1.3, trending upwards during this admission.  Creatinine 3.24.  IV fluids ordered, renal ultrasound. -Lokelma    Acute anemia - Suspect blood loss.  No obvious evidence signs of bleeding.  Will order 1 unit PRBC.   Permanent atrial fibrillation - Patient does not take anticoagulation.  Continue metoprolol .   Chronic HFpEF - Holding diuretics.  Does not appear to be acutely exacerbated.   Dementia/anxiety - Pleasantly demented, does not  remember falling or her injuries.  Will continue home medications once able to tolerate p.o.   Hypertension - Norvasc , metoprolol .  IV as needed     DVT prophylaxis: SCDs Start: 05/05/24 0015    Code Status: Full Code Family Communication:   Status is: Inpatient Remains inpatient appropriate because: Pleasantly confused, ongoing evaluation for her right wrist fracture   PT Follow up Recs: Skilled Nursing-Short Term Rehab (<3 Hours/Day) (Or Return To [place] If Staff Can Give Tot A)05/06/2024 1618  Subjective: Seen at bedside, pleasantly confused.  Reporting of her right wrist pain   Examination:  General exam: Appears calm and comfortable, elderly frail Respiratory system: Clear to auscultation. Respiratory effort normal. Cardiovascular system: S1 & S2 heard, RRR. No JVD, murmurs, rubs, gallops or clicks. No pedal edema. Gastrointestinal system: Abdomen is nondistended, soft and nontender. No organomegaly or masses felt. Normal bowel sounds heard. Central nervous system: Alert and oriented to name only, baseline. No focal neurological deficits. Extremities: Symmetric 5 x 5 power. Skin: No rashes, lesions or ulcers Psychiatry: Judgement and insight appear poor                  Objective: Vitals:   05/07/24 0829 05/07/24 0915 05/07/24 0935 05/07/24 1033  BP: 90/67 95/70 92/66  95/70  Pulse: 95 91 90 91  Resp: 18 19 18    Temp: 98.2 F (36.8 C) 98 F (36.7 C) 98.2 F (36.8 C)   TempSrc: Oral Oral    SpO2: 96% 96%    Weight:      Height:        Intake/Output Summary (Last 24 hours) at 05/07/2024 1147 Last data filed at 05/07/2024  0900 Gross per 24 hour  Intake 410 ml  Output 450 ml  Net -40 ml   Filed Weights   05/05/24 0149 05/05/24 1435 05/06/24 0704  Weight: 75.7 kg 75.7 kg 82.5 kg    Scheduled Meds:  acetaminophen   1,000 mg Oral Q8H   amLODipine   10 mg Oral Daily   aspirin   81 mg Oral BID   busPIRone   7.5 mg Oral TID   FLUoxetine   10 mg Oral  Daily   LORazepam   0.5 mg Oral Q1200   metoprolol  tartrate  25 mg Oral BID   QUEtiapine   50 mg Oral QHS   sodium zirconium cyclosilicate   10 g Oral TID   Continuous Infusions:  sodium chloride  75 mL/hr at 05/07/24 0900   tranexamic acid       Nutritional status Signs/Symptoms: estimated needs Interventions: Ensure Enlive (each supplement provides 350kcal and 20 grams of protein), MVI, Liberalize Diet Body mass index is 26.86 kg/m.  Data Reviewed:   CBC: Recent Labs  Lab 05/04/24 2023 05/05/24 0625 05/05/24 1523 05/06/24 0600 05/07/24 0501  WBC 8.2 11.3*  --  12.7* 13.4*  NEUTROABS 6.0  --   --   --   --   HGB 13.4 11.5* 10.9* 8.3* 6.6*  HCT 40.7 35.1* 32.0* 25.8* 20.7*  MCV 95.3 95.9  --  98.5 99.0  PLT 224 167  --  124* 94*   Basic Metabolic Panel: Recent Labs  Lab 05/05/24 0625 05/05/24 1523 05/05/24 2002 05/06/24 0600 05/06/24 1410 05/07/24 0501  NA 134* 136 135 134* 134* 136  K 5.8* 5.0 5.4* 6.2* 5.8* 5.2*  CL 104 106 106 107 105 104  CO2 21*  --  21* 16* 13* 19*  GLUCOSE 142* 131* 193* 162* 135* 107*  BUN 40* 35* 35* 43* 50* 58*  CREATININE 2.13* 1.70* 1.76* 2.35* 2.73* 3.24*  CALCIUM  9.3  --  8.9 8.8* 8.4* 8.8*   GFR: Estimated Creatinine Clearance: 13.8 mL/min (A) (by C-G formula based on SCr of 3.24 mg/dL (H)). Liver Function Tests: No results for input(s): AST, ALT, ALKPHOS, BILITOT, PROT, ALBUMIN  in the last 168 hours. No results for input(s): LIPASE, AMYLASE in the last 168 hours. No results for input(s): AMMONIA in the last 168 hours. Coagulation Profile: No results for input(s): INR, PROTIME in the last 168 hours. Cardiac Enzymes: No results for input(s): CKTOTAL, CKMB, CKMBINDEX, TROPONINI in the last 168 hours. BNP (last 3 results) No results for input(s): PROBNP in the last 8760 hours. HbA1C: No results for input(s): HGBA1C in the last 72 hours. CBG: No results for input(s): GLUCAP in the last 168  hours. Lipid Profile: No results for input(s): CHOL, HDL, LDLCALC, TRIG, CHOLHDL, LDLDIRECT in the last 72 hours. Thyroid  Function Tests: No results for input(s): TSH, T4TOTAL, FREET4, T3FREE, THYROIDAB in the last 72 hours. Anemia Panel: No results for input(s): VITAMINB12, FOLATE, FERRITIN, TIBC, IRON, RETICCTPCT in the last 72 hours. Sepsis Labs: No results for input(s): PROCALCITON, LATICACIDVEN in the last 168 hours.  Recent Results (from the past 240 hours)  Surgical PCR screen     Status: None   Collection Time: 05/05/24  2:19 AM   Specimen: Nasal Mucosa; Nasal Swab  Result Value Ref Range Status   MRSA, PCR NEGATIVE NEGATIVE Final   Staphylococcus aureus NEGATIVE NEGATIVE Final    Comment: (NOTE) The Xpert SA Assay (FDA approved for NASAL specimens in patients 52 years of age and older), is one component of a comprehensive surveillance program. It  is not intended to diagnose infection nor to guide or monitor treatment. Performed at [facility], [address] [place], [place] [address]          Radiology Studies: DG HIP UNILAT WITH PELVIS 1V RIGHT Result Date: 05/06/2024 CLINICAL DATA:  Status post right hip fixation EXAM: DG HIP (WITH OR WITHOUT PELVIS) 1V RIGHT COMPARISON:  Films from earlier in the same day. FINDINGS: Proximal medullary rod with distal fixation is noted. Two fixation screws traverse the femoral neck. No soft tissue abnormality is noted. IMPRESSION: Status post ORIF right proximal femoral fracture. Electronically Signed   By: Oneil Devonshire M.D.   On: 05/06/2024 01:31   DG FEMUR, MIN 2 VIEWS RIGHT Result Date: 05/05/2024 CLINICAL DATA:  Fracture, postop. EXAM: RIGHT FEMUR 2 VIEWS COMPARISON:  Preoperative imaging. FINDINGS: Femoral intramedullary nail with trans trochanteric and distal locking screw fixation traverse proximal femur fracture. Persistent displacement of the lesser trochanteric fragment. No  periprosthetic lucency. The distal femur is intact. Recent postsurgical change includes air and edema in the soft tissues with skin staples laterally. IMPRESSION: ORIF of proximal femur fracture. No immediate postoperative complication. Electronically Signed   By: Andrea Gasman M.D.   On: 05/05/2024 20:11   DG HIP UNILAT WITH PELVIS 2-3 VIEWS RIGHT Result Date: 05/05/2024 CLINICAL DATA:  Elective surgery. EXAM: DG HIP (WITH OR WITHOUT PELVIS) 2-3V RIGHT COMPARISON:  Radiograph yesterday FINDINGS: Seven fluoroscopic spot views of the right proximal femur and hip submitted from the operating room. Femoral intramedullary nail with trans trochanteric and distal locking screw fixation traverse proximal femur fracture. Fluoroscopy time 1 minutes 3 seconds. Dose 12.93 mGy. IMPRESSION: Intraoperative fluoroscopy during proximal femur fracture ORIF. Electronically Signed   By: Andrea Gasman M.D.   On: 05/05/2024 20:10   DG C-Arm 1-60 Min-No Report Result Date: 05/05/2024 Fluoroscopy was utilized by the requesting physician.  No radiographic interpretation.           LOS: 3 days   Time spent= 35 mins    Burgess JAYSON Dare, MD [facility]  If 7PM-7AM, please contact night-coverage  05/07/2024, 11:47 AM

## 2024-05-07 NOTE — TOC Initial Note (Signed)
 Transition of Care [facility]) - Initial/Assessment Note    Patient Details  Name: Tiffany Black MRN: [id number] Date of Birth: [birth year]  Transition of Care [facility]) CM/SW Contact:    Bridget Cordella Simmonds, LCSW Phone Number: 05/07/2024, 6:01 PM  Clinical Narrative:    Pt oriented x1, from [facility] memory care unit.  CSW spoke with son Chirs by phone.  Discussed PT recommendation for SNF and [name] does want to pursue this.  Prior to injury, pt was ambulatory with walker and [name] would like her to continue to walk.  Permission given to send referral out in hub.  Pt was at [facility] previously, but [name] would like all options.    Referral sent out in hub for SNF.               Expected Discharge Plan: Skilled Nursing Facility Barriers to Discharge: Continued Medical Work up, SNF Pending bed offer   Patient Goals and CMS Choice     Choice offered to / list presented to : Adult Children (son [name])      Expected Discharge Plan and Services In-house Referral: Clinical Social Work   Post Acute Care Choice: Skilled Nursing Facility Living arrangements for the past 2 months: Assisted Living Facility Horticulturist, commercial memory care)                                      Prior Living Arrangements/Services Living arrangements for the past 2 months: Assisted Living Facility Horticulturist, commercial memory care) Lives with:: Facility Resident Patient language and need for interpreter reviewed:: No        Need for Family Participation in Patient Care: Yes (Comment) Care giver support system in place?: Yes (comment) Current home services: Other (comment) (na) Criminal Activity/Legal Involvement Pertinent to Current Situation/Hospitalization: No - Comment as needed  Activities of Daily Living   ADL Screening (condition at time of admission) Independently performs ADLs?: Yes (appropriate for developmental age) Is the patient deaf or have difficulty hearing?: No Does the patient have difficulty  seeing, even when wearing glasses/contacts?: No Does the patient have difficulty concentrating, remembering, or making decisions?: Yes  Permission Sought/Granted                  Emotional Assessment   Attitude/Demeanor/Rapport: Unable to Assess Affect (typically observed): Unable to Assess Orientation: : Oriented to Self      Admission diagnosis:  AKI (acute kidney injury) (HCC) [N17.9] Fall, initial encounter [W19.XXXA] Closed right hip fracture, initial encounter (HCC) [S72.001A] Displaced intertrochanteric fracture of right femur, initial encounter for closed fracture (HCC) [S72.141A] Other closed intra-articular fracture of distal end of right radius, initial encounter [S52.571A] Closed fracture of distal end of right ulna, unspecified fracture morphology, initial encounter [S52.601A] Patient Active Problem List   Diagnosis Date Noted   Displaced intertrochanteric fracture of right femur, initial encounter for closed fracture (HCC) 05/06/2024   Closed fracture of right distal radius and ulna 05/05/2024   Closed right hip fracture, initial encounter (HCC) 05/04/2024   Need for emotional support 02/16/2023   Counseling and coordination of care 02/16/2023   Palliative care encounter 02/16/2023   CAP (community acquired pneumonia) 02/15/2023   Acute on chronic diastolic CHF (congestive heart failure) (HCC) 02/15/2023   Acute anemia 10/20/2022   Pain and swelling of right wrist 10/02/2022   Shortness of breath 09/29/2022   Multiple fractures of ribs, right side, subsequent encounter for fracture  with routine healing 09/29/2022   Goals of care, counseling/discussion 09/29/2022   Recurrent right pleural effusion 09/27/2022   Dementia with behavioral disturbance (HCC) 09/27/2022   Lung nodule 09/27/2022   COVID-19 virus infection 08/27/2022   Acute respiratory failure with hypoxia (HCC) 08/27/2022   Generalized weakness 08/27/2022   Fall 08/27/2022   Hyponatremia  08/27/2022   Acute renal failure superimposed on stage 3b chronic kidney disease (HCC) 08/27/2022   Anemia of chronic disease 08/27/2022   Acute encephalopathy 08/10/2022   GAD (generalized anxiety disorder) 08/10/2022   TIA (transient ischemic attack) 08/10/2022   Aphasia 08/09/2022   Atrial fibrillation with RVR (HCC) 09/07/2021   Headache syndrome 07/02/2017   Chest pain 05/23/2015   Essential hypertension    History of stroke 05/10/2015   Dizziness 05/10/2015   Orthostatic hypotension 05/10/2015   Cerebral infarction (HCC) 05/10/2015   Hypokalemia 09/30/2013   Vertigo 02/28/2013   Sinusitis 02/28/2013   Anxiety 02/28/2013   Paroxysmal atrial fibrillation with RVR (HCC) 02/28/2013   PAC (premature atrial contraction) 04/10/2012   Abnormal EKG 01/19/2012   Persistent atrial fibrillation (HCC) 02/15/2010   PCP:  [name], MD Pharmacy:   [facility] [phone] - [place], [place] - [address] AT [address]NER OF [facility] [address] [place] [address] Phone: [phone] Fax: [phone]  [id number] - [place], [place] - [address] [place] [address] Phone: [phone] Fax: [phone]  [facility] - [place], [place] - [address] [place] [address] Phone: [phone] Fax: [phone]  [facility] [place], [place] - [place] [address] [place] [address] Phone: [phone] Fax: [phone]     Social Drivers of Health (SDOH) Social History: SDOH Screenings   Food Insecurity: No Food Insecurity (05/05/2024)  Housing: Low Risk  (05/05/2024)  Transportation Needs: No Transportation Needs (05/05/2024)  Utilities: Not At Risk (05/05/2024)  Social Connections: Unknown (05/05/2024)  Tobacco Use: Low Risk  (05/05/2024)   SDOH Interventions:     Readmission Risk Interventions    08/29/2022    10:19 AM  Readmission Risk Prevention Plan  Transportation Screening Complete  Medication Review (RN Care Manager) Complete  PCP or Specialist appointment within 3-5 days of discharge Complete  HRI or Home Care Consult Complete  SW Recovery Care/Counseling Consult Complete  Palliative Care Screening Not Applicable  Skilled Nursing Facility Complete

## 2024-05-07 NOTE — Care Management Important Message (Signed)
 Important Message  Patient Details  Name: Tiffany Black MRN: [id number] Date of Birth: [birth year]   Important Message Given:  Yes - Medicare IM     Jon Cruel 05/07/2024, 4:39 PM

## 2024-05-07 NOTE — H&P (View-Only) (Signed)
 Reason for Consult:Right wrist fx Referring Physician: Burgess Dare Time called: 9051 Time at bedside: [address] Tiffany Black is an 89 y.o. female.  HPI: Tiffany Black fell over the weekend at the SNF where she resides. She was found to have a right wrist fx and hand surgery was consulted. She is RHD but demented and cannot reliably contribute to history.  Past Medical History:  Diagnosis Date   Anxiety    Arthritis    BPPV (benign paroxysmal positional vertigo)    Breast lesion    left   Bulging of cervical intervertebral disc    Cardiomegaly    Cataracts, bilateral    Chronic neck pain    CVA (cerebral vascular accident) (HCC)    x2 2013 and 2015; right temporal lobe and left frontal lobe infarct-> dzziness which resolved; occluded right PCA, moderate posterior left cerebral stenosis, left MCA stenosis on MRA   Dementia [facility])    Essential hypertension    History of endometrial cancer    History of stroke    2015 and 2016 // carotid US  8/16: Bilateral 1-39 // carotid US  2/16: Bilateral ICA 1-39   HLD (hyperlipidemia)    Hx of echocardiogram    a. Echo (11/15): Mild LVH, EF 55-60%, normal wall motion, grade 2 diastolic dysfunction, mild LAE, normal RV function, PASP 29 mm Hg // echo 8/16:Mild concentric LVH, EF 55-60, normal wall motion, grade 2 diastolic dysfunction, mild LAE, PASP 39   Hypertension    IBS (irritable bowel syndrome)    Insomnia    Lung nodule    OA (osteoarthritis)    Pectus excavatum    Persistent atrial fibrillation (HCC)    Pleural effusion    right recurrent, 1/24 s/p thoracentesis x 3 (cytology negative)- Dr Kara   Pneumonia due to COVID-19 virus    Radius fracture    left distal   Vertigo    Vitamin D deficiency     Past Surgical History:  Procedure Laterality Date   ABDOMINAL HYSTERECTOMY  2011   CATARACT EXTRACTION, BILATERAL Bilateral 2009   CESAREAN SECTION  1975   INTRAMEDULLARY (IM) NAIL INTERTROCHANTERIC Right 05/05/2024   Procedure:  FIXATION, FRACTURE, INTERTROCHANTERIC, WITH INTRAMEDULLARY ROD;  Surgeon: Georgina Ozell LABOR, MD;  Location: [facility] OR;  Service: Orthopedics;  Laterality: Right;   IR THORACENTESIS ASP PLEURAL SPACE W/IMG GUIDE  09/28/2022   IR THORACENTESIS ASP PLEURAL SPACE W/IMG GUIDE  10/23/2022   LAPAROSCOPIC HYSTERECTOMY  2011   THORACENTESIS Right 11/03/2022   Procedure: THORACENTESIS;  Surgeon: Gladis Leonor HERO, MD;  Location: [facility] ENDOSCOPY;  Service: Pulmonary;  Laterality: Right;    Family History  Problem Relation Age of Onset   Stroke Mother    Heart attack Father    Heart disease Father    Dementia Sister    CAD Brother    Stroke Maternal Uncle    Hypertension Neg Hx     Social History:  reports that she has never smoked. She has never used smokeless tobacco. She reports that she does not drink alcohol  and does not use drugs.  Allergies:  Allergies  Allergen Reactions   Hydrochlorothiazide Hives   Penicillins Anaphylaxis and Hives   Cipro  [Ciprofloxacin  Hcl] Other (See Comments)    blood pressure issues Unknown if medication increased or decreased blood pressure   Iodinated Contrast Media Diarrhea, Other (See Comments) and Hypertension    Elevated the B/P   Iodine-131 Other (See Comments) and Hypertension    Raised the blood  pressure   Lasix [Furosemide] Other (See Comments)    Unknown reaction   Latex Itching   Lipitor [Atorvastatin] Other (See Comments)    Myopathy    Lortab [Hydrocodone -Acetaminophen ] Other (See Comments)    Unknown reaction   Niaspan [Niacin] Other (See Comments)    Flu-like symptoms   Statins Other (See Comments)    Myopathy   Toprol  Xl [Metoprolol ] Other (See Comments)    Unknown reaction to metoprolol  succinate only, no reaction to tartrate   Vibramycin [Doxycycline] Diarrhea and Nausea And Vomiting   Bactrim [Sulfamethoxazole-Trimethoprim] Hives and Rash   Codeine Nausea And Vomiting   Nickel Rash   Sulfa Antibiotics Hives and Rash   Sulfonamide  Derivatives Hives and Rash        Tegretol [Carbamazepine] Rash, Other (See Comments) and Hypertension    flushed blood pressure medication out of system    Medications: I have reviewed the patient's current medications.  Results for orders placed or performed during the hospital encounter of 05/04/24 (from the past 48 hours)  Type and screen [facility]     Status: None (Preliminary result)   Collection Time: 05/05/24  2:59 PM  Result Value Ref Range   ABO/RH(D) A POS    Antibody Screen NEG    Sample Expiration 05/08/2024,2359    Unit Number [id number]    Blood Component Type RED CELLS,LR    Unit division 00    Status of Unit ISSUED    Transfusion Status OK TO TRANSFUSE    Crossmatch Result      Compatible Performed at [facility], [address], [place], [place] [address]   I-STAT, nathanael 8     Status: Abnormal   Collection Time: 05/05/24  3:23 PM  Result Value Ref Range   Sodium 136 135 - 145 mmol/L   Potassium 5.0 3.5 - 5.1 mmol/L   Chloride 106 98 - 111 mmol/L   BUN 35 (H) 8 - 23 mg/dL   Creatinine, Ser 8.29 (H) 0.44 - 1.00 mg/dL   Glucose, Bld 868 (H) 70 - 99 mg/dL    Comment: Glucose reference range applies only to samples taken after fasting for at least 8 hours.   Calcium , Ion 1.22 1.15 - 1.40 mmol/L   TCO2 21 (L) 22 - 32 mmol/L   Hemoglobin 10.9 (L) 12.0 - 15.0 g/dL   HCT 67.9 (L) 63.9 - 53.9 %  Basic metabolic panel     Status: Abnormal   Collection Time: 05/05/24  8:02 PM  Result Value Ref Range   Sodium 135 135 - 145 mmol/L   Potassium 5.4 (H) 3.5 - 5.1 mmol/L   Chloride 106 98 - 111 mmol/L   CO2 21 (L) 22 - 32 mmol/L   Glucose, Bld 193 (H) 70 - 99 mg/dL    Comment: Glucose reference range applies only to samples taken after fasting for at least 8 hours.   BUN 35 (H) 8 - 23 mg/dL   Creatinine, Ser 8.23 (H) 0.44 - 1.00 mg/dL   Calcium  8.9 8.9 - 10.3 mg/dL   GFR, Estimated 27 (L) >60 mL/min    Comment: (NOTE) Calculated using  the CKD-EPI Creatinine Equation (2021)    Anion gap 8 5 - 15    Comment: Performed at [facility], [address] [place], [place] [address]  CBC     Status: Abnormal   Collection Time: 05/06/24  6:00 AM  Result Value Ref Range   WBC  12.7 (H) 4.0 - 10.5 K/uL   RBC 2.62 (L) 3.87 - 5.11 MIL/uL   Hemoglobin 8.3 (L) 12.0 - 15.0 g/dL    Comment: REPEATED TO VERIFY Result Called to, Read Back by and Verified with: ALISA FOWLER, RN    HCT 25.8 (L) 36.0 - 46.0 %   MCV 98.5 80.0 - 100.0 fL   MCH 31.7 26.0 - 34.0 pg   MCHC 32.2 30.0 - 36.0 g/dL   RDW 86.8 88.4 - 84.4 %   Platelets 124 (L) 150 - 400 K/uL   nRBC 0.0 0.0 - 0.2 %    Comment: Performed at [facility], [address], [place], [place] [address]  Basic metabolic panel     Status: Abnormal   Collection Time: 05/06/24  6:00 AM  Result Value Ref Range   Sodium 134 (L) 135 - 145 mmol/L   Potassium 6.2 (H) 3.5 - 5.1 mmol/L   Chloride 107 98 - 111 mmol/L   CO2 16 (L) 22 - 32 mmol/L   Glucose, Bld 162 (H) 70 - 99 mg/dL    Comment: Glucose reference range applies only to samples taken after fasting for at least 8 hours.   BUN 43 (H) 8 - 23 mg/dL   Creatinine, Ser 7.64 (H) 0.44 - 1.00 mg/dL   Calcium  8.8 (L) 8.9 - 10.3 mg/dL   GFR, Estimated 19 (L) >60 mL/min    Comment: (NOTE) Calculated using the CKD-EPI Creatinine Equation (2021)    Anion gap 11 5 - 15    Comment: Performed at [facility], [address], [place], [place] [address]  Basic metabolic panel     Status: Abnormal   Collection Time: 05/06/24  2:10 PM  Result Value Ref Range   Sodium 134 (L) 135 - 145 mmol/L   Potassium 5.8 (H) 3.5 - 5.1 mmol/L   Chloride 105 98 - 111 mmol/L   CO2 13 (L) 22 - 32 mmol/L   Glucose, Bld 135 (H) 70 - 99 mg/dL    Comment: Glucose reference range applies only to samples taken after fasting for at least 8 hours.   BUN 50 (H) 8 - 23 mg/dL   Creatinine, Ser 7.26 (H) 0.44 - 1.00 mg/dL   Calcium  8.4 (L) 8.9 - 10.3  mg/dL   GFR, Estimated 16 (L) >60 mL/min    Comment: (NOTE) Calculated using the CKD-EPI Creatinine Equation (2021)    Anion gap 16 (H) 5 - 15    Comment: Performed at [facility], [address], [place], [place] [address]  CBC     Status: Abnormal   Collection Time: 05/07/24  5:01 AM  Result Value Ref Range   WBC 13.4 (H) 4.0 - 10.5 K/uL   RBC 2.09 (L) 3.87 - 5.11 MIL/uL   Hemoglobin 6.6 (LL) 12.0 - 15.0 g/dL    Comment: REPEATED TO VERIFY This critical result has been called to GLYN AGENT, RN by Ezzie Custodio on 05/07/2024 06:20:25, and has been read back.    HCT 20.7 (L) 36.0 - 46.0 %   MCV 99.0 80.0 - 100.0 fL   MCH 31.6 26.0 - 34.0 pg   MCHC 31.9 30.0 - 36.0 g/dL   RDW 86.7 88.4 - 84.4 %   Platelets 94 (L) 150 - 400 K/uL    Comment: REPEATED TO VERIFY SPECIMEN CHECKED FOR CLOTS PLATELET COUNT CONFIRMED BY SMEAR Immature Platelet Fraction may be clinically indicated, consider ordering this additional test [id number]  nRBC 0.0 0.0 - 0.2 %    Comment: Performed at [facility], [address] [place], [place] [address]  Basic metabolic panel     Status: Abnormal   Collection Time: 05/07/24  5:01 AM  Result Value Ref Range   Sodium 136 135 - 145 mmol/L   Potassium 5.2 (H) 3.5 - 5.1 mmol/L   Chloride 104 98 - 111 mmol/L   CO2 19 (L) 22 - 32 mmol/L   Glucose, Bld 107 (H) 70 - 99 mg/dL    Comment: Glucose reference range applies only to samples taken after fasting for at least 8 hours.   BUN 58 (H) 8 - 23 mg/dL   Creatinine, Ser 6.75 (H) 0.44 - 1.00 mg/dL   Calcium  8.8 (L) 8.9 - 10.3 mg/dL   GFR, Estimated 13 (L) >60 mL/min    Comment: (NOTE) Calculated using the CKD-EPI Creatinine Equation (2021)    Anion gap 13 5 - 15    Comment: Performed at [facility], [address], [place], [place] [address]  Prepare RBC (crossmatch)     Status: None   Collection Time: 05/07/24  7:34 AM  Result Value Ref Range   Order Confirmation      ORDER  PROCESSED BY BLOOD BANK Performed at [facility], [address], [place], [place] [address]     DG HIP UNILAT WITH PELVIS 1V RIGHT Result Date: 05/06/2024 CLINICAL DATA:  Status post right hip fixation EXAM: DG HIP (WITH OR WITHOUT PELVIS) 1V RIGHT COMPARISON:  Films from earlier in the same day. FINDINGS: Proximal medullary rod with distal fixation is noted. Two fixation screws traverse the femoral neck. No soft tissue abnormality is noted. IMPRESSION: Status post ORIF right proximal femoral fracture. Electronically Signed   By: Oneil Devonshire M.D.   On: 05/06/2024 01:31   DG FEMUR, MIN 2 VIEWS RIGHT Result Date: 05/05/2024 CLINICAL DATA:  Fracture, postop. EXAM: RIGHT FEMUR 2 VIEWS COMPARISON:  Preoperative imaging. FINDINGS: Femoral intramedullary nail with trans trochanteric and distal locking screw fixation traverse proximal femur fracture. Persistent displacement of the lesser trochanteric fragment. No periprosthetic lucency. The distal femur is intact. Recent postsurgical change includes air and edema in the soft tissues with skin staples laterally. IMPRESSION: ORIF of proximal femur fracture. No immediate postoperative complication. Electronically Signed   By: Andrea Gasman M.D.   On: 05/05/2024 20:11   DG HIP UNILAT WITH PELVIS 2-3 VIEWS RIGHT Result Date: 05/05/2024 CLINICAL DATA:  Elective surgery. EXAM: DG HIP (WITH OR WITHOUT PELVIS) 2-3V RIGHT COMPARISON:  Radiograph yesterday FINDINGS: Seven fluoroscopic spot views of the right proximal femur and hip submitted from the operating room. Femoral intramedullary nail with trans trochanteric and distal locking screw fixation traverse proximal femur fracture. Fluoroscopy time 1 minutes 3 seconds. Dose 12.93 mGy. IMPRESSION: Intraoperative fluoroscopy during proximal femur fracture ORIF. Electronically Signed   By: Andrea Gasman M.D.   On: 05/05/2024 20:10   DG C-Arm 1-60 Min-No Report Result Date: 05/05/2024 Fluoroscopy was  utilized by the requesting physician.  No radiographic interpretation.    Review of Systems  Unable to perform ROS: Dementia   Blood pressure 95/70, pulse 91, temperature 98.2 F (36.8 C), resp. rate 18, height 5' 9 (1.753 m), weight 82.5 kg, SpO2 96%. Physical Exam Constitutional:      General: She is not in acute distress.    Appearance: She is well-developed. She is not diaphoretic.  HENT:     Head: Normocephalic  and atraumatic.  Eyes:     General: No scleral icterus.       Right eye: No discharge.        Left eye: No discharge.     Conjunctiva/sclera: Conjunctivae normal.  Cardiovascular:     Rate and Rhythm: Normal rate and regular rhythm.  Pulmonary:     Effort: Pulmonary effort is normal. No respiratory distress.  Musculoskeletal:     Cervical back: Normal range of motion.     Comments: Right shoulder, elbow, wrist, digits- no skin wounds, severe TTP wrist, splint in bed with pt, no instability, no blocks to motion  Sens  Ax/R/M/U intact  Mot   Ax/ R/ PIN/ M/ AIN/ U intact  Rad 2+  Skin:    General: Skin is warm and dry.  Neurological:     Mental Status: She is alert.  Psychiatric:        Mood and Affect: Mood normal.        Behavior: Behavior normal.     Assessment/Plan: Right wrist fx -- Plan ORIF Saturday with Dr. Romona. Please keep NPO after MN Friday.    Ozell DOROTHA Ned, PA-C Orthopedic Surgery [phone] 05/07/2024, 10:44 AM     Agree with above.  I spoke with patient's son, Tiffany Black, at length about his mom's injury and the various available treatment options.  We specifically discussed non operative management with a cast versus open reduction and internal fixation.  We reviewed the pros and cons of each treatment approach.  We discussed that the risks of surgery would include bleeding, infection, damage to neurovascular structures, nonunion, malunion, symptomatic hardware, persistent pain or swelling, stiffness, delayed wound healing,  and need for additional surgery.  Patient is demented but independent with most of her daily activities such as hygiene and dressing herself.  As such, he has elected to proceed with open reduction and internal fixation rather than conservative management.  Questions were encouraged and answered.  We will plan to proceed with surgery tomorrow.  Bebe Romona, M.D. [facility]

## 2024-05-08 DIAGNOSIS — S72001A Fracture of unspecified part of neck of right femur, initial encounter for closed fracture: Secondary | ICD-10-CM | POA: Diagnosis not present

## 2024-05-08 LAB — TYPE AND SCREEN
ABO/RH(D): A POS
Antibody Screen: NEGATIVE
Unit division: 0

## 2024-05-08 LAB — BPAM RBC
Blood Product Expiration Date: 202509252359
ISSUE DATE / TIME: 202508270859
Unit Type and Rh: 6200

## 2024-05-08 LAB — CBC
HCT: 22.6 % — ABNORMAL LOW (ref 36.0–46.0)
Hemoglobin: 7.6 g/dL — ABNORMAL LOW (ref 12.0–15.0)
MCH: 31.7 pg (ref 26.0–34.0)
MCHC: 33.6 g/dL (ref 30.0–36.0)
MCV: 94.2 fL (ref 80.0–100.0)
Platelets: 93 K/uL — ABNORMAL LOW (ref 150–400)
RBC: 2.4 MIL/uL — ABNORMAL LOW (ref 3.87–5.11)
RDW: 14.5 % (ref 11.5–15.5)
WBC: 9.4 K/uL (ref 4.0–10.5)
nRBC: 0.2 % (ref 0.0–0.2)

## 2024-05-08 LAB — BASIC METABOLIC PANEL WITH GFR
Anion gap: 7 (ref 5–15)
BUN: 49 mg/dL — ABNORMAL HIGH (ref 8–23)
CO2: 18 mmol/L — ABNORMAL LOW (ref 22–32)
Calcium: 7.2 mg/dL — ABNORMAL LOW (ref 8.9–10.3)
Chloride: 111 mmol/L (ref 98–111)
Creatinine, Ser: 1.92 mg/dL — ABNORMAL HIGH (ref 0.44–1.00)
GFR, Estimated: 25 mL/min — ABNORMAL LOW (ref >60)
Glucose, Bld: 88 mg/dL (ref 70–99)
Potassium: 3.7 mmol/L (ref 3.5–5.1)
Sodium: 136 mmol/L (ref 135–145)

## 2024-05-08 LAB — MAGNESIUM: Magnesium: 1.7 mg/dL (ref 1.7–2.4)

## 2024-05-08 MED ORDER — SODIUM CHLORIDE 0.9 % IV SOLN: INTRAVENOUS | Status: DC

## 2024-05-08 MED ORDER — ENSURE PLUS HIGH PROTEIN PO LIQD
237.0000 mL | Freq: Two times a day (BID) | ORAL | Status: DC
  Administered 2024-05-08 – 2024-05-14 (×9): 237 mL via ORAL

## 2024-05-08 NOTE — TOC Progression Note (Signed)
 Transition of Care [facility]) - Progression Note    Patient Details  Name: Tiffany Black MRN: [id number] Date of Birth: 05-06-1934  Transition of Care [facility]) CM/SW Contact  Bridget Cordella Simmonds, LCSW Phone Number: 05/08/2024, 1:38 PM  Clinical Narrative:   Bed offers provided to pt son [name].  He will accept offer at [facility].     Expected Discharge Plan: Skilled Nursing Facility Barriers to Discharge: Continued Medical Work up, SNF Pending bed offer               Expected Discharge Plan and Services In-house Referral: Clinical Social Work   Post Acute Care Choice: Skilled Nursing Facility Living arrangements for the past 2 months: Assisted Living Facility Horticulturist, commercial memory care)                                       Social Drivers of Health (SDOH) Interventions SDOH Screenings   Food Insecurity: No Food Insecurity (05/05/2024)  Housing: Low Risk  (05/05/2024)  Transportation Needs: No Transportation Needs (05/05/2024)  Utilities: Not At Risk (05/05/2024)  Social Connections: Unknown (05/05/2024)  Tobacco Use: Low Risk  (05/05/2024)    Readmission Risk Interventions    08/29/2022   10:19 AM  Readmission Risk Prevention Plan  Transportation Screening Complete  Medication Review (RN Care Manager) Complete  PCP or Specialist appointment within 3-5 days of discharge Complete  HRI or Home Care Consult Complete  SW Recovery Care/Counseling Consult Complete  Palliative Care Screening Not Applicable  Skilled Nursing Facility Complete

## 2024-05-08 NOTE — Progress Notes (Signed)
 Occupational Therapy Treatment Patient Details Name: Tiffany Black MRN: [id number] DOB: 1934-08-18 Today's Date: 05/08/2024   History of present illness Pt is an 89 y/o F presenting to ED on 8/24 from SNF after fall, found to have R intertrochanteric femur fx and R distal ulnar/radius fx. S/p R femur ORIF, R distal radius fx managed in splint. PMH includes CVA, HTN, dementia, anxiety, chronic HFpEF, A fib, CKD III   OT comments  Patient unable to tolerate EOB sitting, near total assist to dangle, and not safe to attempt stand and transfer to recliner.  Patient much more alert, but significant baseline dementia, alert to self only.  Able to follow basic commands, but constant reinforcement.  OT can continue efforts in the acute setting to address deficits, and Patient will benefit from continued inpatient follow up therapy, <3 hours/day.  Wrist ORIF appears to be planned at some point during this hospitalization.        If plan is discharge home, recommend the following:  Two people to help with walking and/or transfers;Two people to help with bathing/dressing/bathroom;Assistance with cooking/housework;Direct supervision/assist for medications management;Direct supervision/assist for financial management;Help with stairs or ramp for entrance;Supervision due to cognitive status;Assist for transportation   Equipment Recommendations       Recommendations for Other Services      Precautions / Restrictions Precautions Precautions: Fall Recall of Precautions/Restrictions: Impaired Restrictions Weight Bearing Restrictions Per Provider Order: Yes RUE Weight Bearing Per Provider Order: Non weight bearing RLE Weight Bearing Per Provider Order: Weight bearing as tolerated Other Position/Activity Restrictions: R wrist sx scheduled for Friday       Mobility Bed Mobility Overal bed mobility: Needs Assistance Bed Mobility: Supine to Sit, Sit to Supine     Supine to sit: Total assist Sit  to supine: Total assist        Transfers                         Balance Overall balance assessment: Needs assistance Sitting-balance support: Feet supported, Single extremity supported Sitting balance-Leahy Scale: Poor   Postural control: Left lateral lean, Posterior lean                                 ADL either performed or assessed with clinical judgement   ADL       Grooming: Minimal assistance;Bed level           Upper Body Dressing : Moderate assistance;Bed level   Lower Body Dressing: Total assistance;Bed level                      Extremity/Trunk Assessment Upper Extremity Assessment RUE Deficits / Details: RUE immobilized in splint, moving LUE  > RUE, decr response to touch/pain to RUE RUE Coordination: decreased fine motor;decreased gross motor   Lower Extremity Assessment Lower Extremity Assessment: Defer to PT evaluation        Vision Patient Visual Report: No change from baseline     Perception Perception Perception: Not tested   Praxis Praxis Praxis: Not tested   Communication Communication Communication: No apparent difficulties   Cognition Arousal: Alert Behavior During Therapy: Restless, Anxious Cognition: History of cognitive impairments             OT - Cognition Comments: Baseline dementia.  Patient not able to comprehend she is in the hospital.  Alert to person only.  Poor  memory.                 Following commands: Impaired Following commands impaired: Follows one step commands inconsistently      Cueing   Cueing Techniques: Verbal cues, Gestural cues  Exercises      Shoulder Instructions       General Comments      Pertinent Vitals/ Pain       Pain Assessment Pain Assessment: Faces Faces Pain Scale: Hurts even more Pain Descriptors / Indicators: Grimacing, Guarding, Sharp Pain Intervention(s): Limited activity within patient's tolerance                                                           Frequency  Min 2X/week        Progress Toward Goals  OT Goals(current goals can now be found in the care plan section)     Acute Rehab OT Goals OT Goal Formulation: With patient Time For Goal Achievement: 05/20/24 Potential to Achieve Goals: Fair  Plan      Co-evaluation                 AM-PAC OT 6 Clicks Daily Activity     Outcome Measure   Help from another person eating meals?: A Lot Help from another person taking care of personal grooming?: A Lot Help from another person toileting, which includes using toliet, bedpan, or urinal?: Total Help from another person bathing (including washing, rinsing, drying)?: A Lot Help from another person to put on and taking off regular upper body clothing?: A Lot Help from another person to put on and taking off regular lower body clothing?: Total 6 Click Score: 10    End of Session    OT Visit Diagnosis: Unsteadiness on feet (R26.81);Other abnormalities of gait and mobility (R26.89);Muscle weakness (generalized) (M62.81);History of falling (Z91.81)   Activity Tolerance Patient limited by pain   Patient Left in bed;with call bell/phone within reach;with bed alarm set   Nurse Communication          Time: [phone] OT Time Calculation (min): 21 min  Charges: OT General Charges $OT Visit: 1 Visit OT Treatments $Self Care/Home Management : 8-22 mins  05/08/2024  RP, OTR/L  Acute Rehabilitation Services  Office:  [phone]   Tiffany Black 05/08/2024, 9:33 AM

## 2024-05-08 NOTE — Progress Notes (Signed)
 PROGRESS NOTE    Tiffany Black  FMW:[id number] DOB: 02-13-1934 DOA: 05/04/2024 PCP: Tiffany Black    Brief Narrative:   [phone] resides at memory care [facility] with Hx of advanced dementia, chronic HFpEF, atrial fibrillation no longer anticoagulated, CKD 3 who presents with right wrist pain and right leg pain after a fall.  Subsequently found to have right intratrochanteric fracture, distal ulnar and radius fracture.  Patient underwent ORIF for the right femur fracture on 8/25.  Hand surgery was consulted who is planning on ORIF 8/30.  Assessment & Plan:  Principal Problem:   Closed right hip fracture, initial encounter [facility]) Active Problems:   Anxiety   Dementia with behavioral disturbance (HCC)   Persistent atrial fibrillation (HCC)   Acute renal failure superimposed on stage 3b chronic kidney disease (HCC)   Closed fracture of right distal radius and ulna   Displaced intertrochanteric fracture of right femur, initial encounter for closed fracture (HCC)    Right intertrochanteric femur fracture s/p ORIF 8/25 - Evaluated by orthopedic surgery.Transition to aspirin  81 mg twice daily postop.  Pain control on board.   Right distal ulnar/radius fracture - Imaging noting acute intra-articular, comminuted, and displaced distal radius metadiaphysis fracture and displaced and comminuted distal ulnar fracture.  Sugar-tong splint.  Ortho planning on ORIF on Saturday.    acute kidney injury on CKD 3B Hyperkalemia - Baseline creatinine 1.3, trending upwards during this admission.  Creatinine 3.24.  With IV fluids renal function has improved.  Renal ultrasound is unremarkable.   Acute anemia - Suspect blood loss.  Baseline hemoglobin 9.0, drifted down 6.6 postoperatively.  Discussed with orthopedic who recommends conservative management and transfusion as needed.    Permanent atrial fibrillation - Patient does not take anticoagulation.  Continue metoprolol .   Chronic HFpEF -  Holding diuretics.  Does not appear to be acutely exacerbated.   Dementia/anxiety - Pleasantly demented, does not remember falling or her injuries.  Will continue home medications once able to tolerate p.o.   Hypertension - Norvasc , metoprolol .  IV as needed     DVT prophylaxis: SCDs Start: 05/05/24 0015    Code Status: Full Code Family Communication:   Status is: Inpatient Remains inpatient appropriate because: Pleasantly confused, ongoing evaluation for her right wrist fracture   PT Follow up Recs: Skilled Nursing-Short Term Rehab (<3 Hours/Day) (Or Return To [facility] If Staff Can Give Tot A)05/06/2024 1618  Subjective: No answer by family  Patient seen and examined at bedside.  Pleasantly confused.  She thinks she is somewhere else and not in the hospital but unable to tell me where.  Does report of right upper extremity pain  Examination:  General exam: Appears calm and comfortable, elderly frail Respiratory system: Clear to auscultation. Respiratory effort normal. Cardiovascular system: S1 & S2 heard, RRR. No JVD, murmurs, rubs, gallops or clicks. No pedal edema. Gastrointestinal system: Abdomen is nondistended, soft and nontender. No organomegaly or masses felt. Normal bowel sounds heard. Central nervous system: Alert and oriented to name only, baseline. No focal neurological deficits. Extremities: Symmetric 5 x 5 power. Skin: No rashes, lesions or ulcers Psychiatry: Judgement and insight appear poor                Diet Orders (From admission, onward)     Start     Ordered   05/10/24 0001  Diet NPO time specified Except for: Sips with Meds, Ice Chips  Diet effective midnight       Question Answer  Comment  Except for Sips with Meds   Except for Ice Chips      05/07/24 1306   05/07/24 1149  Diet Heart Room service appropriate? Yes; Fluid consistency: Thin  Diet effective now       Question Answer Comment  Room service appropriate? Yes   Fluid  consistency: Thin      05/07/24 1148            Objective: Vitals:   05/07/24 2100 05/08/24 0500 05/08/24 0545 05/08/24 0803  BP: (!) 141/112  107/62 (!) 117/56  Pulse: (!) 110  (!) 118   Resp: 16  16 16   Temp: 98.4 F (36.9 C)  97.9 F (36.6 C) 98.5 F (36.9 C)  TempSrc: Oral  Oral Oral  SpO2: 95%  95% 93%  Weight:  83.2 kg    Height:        Intake/Output Summary (Last 24 hours) at 05/08/2024 1046 Last data filed at 05/08/2024 0900 Gross per 24 hour  Intake 1800.95 ml  Output 600 ml  Net 1200.95 ml   Filed Weights   05/05/24 1435 05/06/24 0704 05/08/24 0500  Weight: 75.7 kg 82.5 kg 83.2 kg    Scheduled Meds:  acetaminophen   1,000 mg Oral Q8H   amLODipine   10 mg Oral Daily   aspirin   81 mg Oral BID   busPIRone   7.5 mg Oral TID   FLUoxetine   10 mg Oral Daily   LORazepam   0.5 mg Oral Q1200   metoprolol  tartrate  25 mg Oral BID   QUEtiapine   50 mg Oral QHS   Continuous Infusions:  sodium chloride  75 mL/hr at 05/08/24 1005   tranexamic acid       Nutritional status Signs/Symptoms: estimated needs Interventions: Ensure Enlive (each supplement provides 350kcal and 20 grams of protein), MVI, Liberalize Diet Body mass index is 27.09 kg/m.  Data Reviewed:   CBC: Recent Labs  Lab 05/04/24 2023 05/05/24 0625 05/05/24 1523 05/06/24 0600 05/07/24 0501 05/07/24 1545 05/08/24 0753  WBC 8.2 11.3*  --  12.7* 13.4*  --  9.4  NEUTROABS 6.0  --   --   --   --   --   --   HGB 13.4 11.5* 10.9* 8.3* 6.6* 7.6* 7.6*  HCT 40.7 35.1* 32.0* 25.8* 20.7* 22.8* 22.6*  MCV 95.3 95.9  --  98.5 99.0  --  94.2  PLT 224 167  --  124* 94*  --  93*   Basic Metabolic Panel: Recent Labs  Lab 05/05/24 2002 05/06/24 0600 05/06/24 1410 05/07/24 0501 05/08/24 0753  NA 135 134* 134* 136 136  K 5.4* 6.2* 5.8* 5.2* 3.7  CL 106 107 105 104 111  CO2 21* 16* 13* 19* 18*  GLUCOSE 193* 162* 135* 107* 88  BUN 35* 43* 50* 58* 49*  CREATININE 1.76* 2.35* 2.73* 3.24* 1.92*  CALCIUM   8.9 8.8* 8.4* 8.8* 7.2*  MG  --   --   --   --  1.7   GFR: Estimated Creatinine Clearance: 23.3 mL/min (A) (by C-G formula based on SCr of 1.92 mg/dL (H)). Liver Function Tests: No results for input(s): AST, ALT, ALKPHOS, BILITOT, PROT, ALBUMIN  in the last 168 hours. No results for input(s): LIPASE, AMYLASE in the last 168 hours. No results for input(s): AMMONIA in the last 168 hours. Coagulation Profile: No results for input(s): INR, PROTIME in the last 168 hours. Cardiac Enzymes: No results for input(s): CKTOTAL, CKMB, CKMBINDEX, TROPONINI in the last 168 hours. BNP (last  3 results) No results for input(s): PROBNP in the last 8760 hours. HbA1C: No results for input(s): HGBA1C in the last 72 hours. CBG: No results for input(s): GLUCAP in the last 168 hours. Lipid Profile: No results for input(s): CHOL, HDL, LDLCALC, TRIG, CHOLHDL, LDLDIRECT in the last 72 hours. Thyroid  Function Tests: No results for input(s): TSH, T4TOTAL, FREET4, T3FREE, THYROIDAB in the last 72 hours. Anemia Panel: No results for input(s): VITAMINB12, FOLATE, FERRITIN, TIBC, IRON, RETICCTPCT in the last 72 hours. Sepsis Labs: No results for input(s): PROCALCITON, LATICACIDVEN in the last 168 hours.  Recent Results (from the past 240 hours)  Surgical PCR screen     Status: None   Collection Time: 05/05/24  2:19 AM   Specimen: Nasal Mucosa; Nasal Swab  Result Value Ref Range Status   MRSA, PCR NEGATIVE NEGATIVE Final   Staphylococcus aureus NEGATIVE NEGATIVE Final    Comment: (NOTE) The Xpert SA Assay (FDA approved for NASAL specimens in patients 39 years of age and older), is one component of a comprehensive surveillance program. It is not intended to diagnose infection nor to guide or monitor treatment. Performed at [facility], [address], [place], [place] [address]          Radiology Studies: US  RENAL Result  Date: 05/07/2024 CLINICAL DATA:  Acute renal injury EXAM: RENAL / URINARY TRACT ULTRASOUND COMPLETE COMPARISON:  None Available. FINDINGS: Right Kidney: Renal measurements: 8.9 x 4.7 x 5.1 cm = volume: 111 mL. Echogenicity within normal limits. No mass or hydronephrosis visualized. Left Kidney: Renal measurements: 10.8 x 5.3 x 5.2 cm = volume: 154 mL. Small anechoic 1 cm cyst. No hydronephrosis Bladder: Appears normal for degree of bladder distention. Other: None. IMPRESSION: 1. No hydronephrosis. 2. Small LEFT renal cyst. Electronically Signed   By: Jackquline Boxer M.D.   On: 05/07/2024 18:48           LOS: 4 days   Time spent= 35 mins    Burgess JAYSON Dare, Black [facility]  If 7PM-7AM, please contact night-coverage  05/08/2024, 10:46 AM

## 2024-05-08 NOTE — Plan of Care (Signed)

## 2024-05-09 DIAGNOSIS — S72001A Fracture of unspecified part of neck of right femur, initial encounter for closed fracture: Secondary | ICD-10-CM | POA: Diagnosis not present

## 2024-05-09 LAB — BASIC METABOLIC PANEL WITH GFR
Anion gap: 11 (ref 5–15)
BUN: 48 mg/dL — ABNORMAL HIGH (ref 8–23)
CO2: 16 mmol/L — ABNORMAL LOW (ref 22–32)
Calcium: 8.6 mg/dL — ABNORMAL LOW (ref 8.9–10.3)
Chloride: 108 mmol/L (ref 98–111)
Creatinine, Ser: 1.72 mg/dL — ABNORMAL HIGH (ref 0.44–1.00)
GFR, Estimated: 28 mL/min — ABNORMAL LOW (ref >60)
Glucose, Bld: 94 mg/dL (ref 70–99)
Potassium: 5.1 mmol/L (ref 3.5–5.1)
Sodium: 135 mmol/L (ref 135–145)

## 2024-05-09 LAB — CBC
HCT: 23.8 % — ABNORMAL LOW (ref 36.0–46.0)
Hemoglobin: 7.6 g/dL — ABNORMAL LOW (ref 12.0–15.0)
MCH: 30.8 pg (ref 26.0–34.0)
MCHC: 31.9 g/dL (ref 30.0–36.0)
MCV: 96.4 fL (ref 80.0–100.0)
Platelets: 122 K/uL — ABNORMAL LOW (ref 150–400)
RBC: 2.47 MIL/uL — ABNORMAL LOW (ref 3.87–5.11)
RDW: 14.3 % (ref 11.5–15.5)
WBC: 10.4 K/uL (ref 4.0–10.5)
nRBC: 0 % (ref 0.0–0.2)

## 2024-05-09 LAB — MAGNESIUM: Magnesium: 2.2 mg/dL (ref 1.7–2.4)

## 2024-05-09 MED ORDER — SODIUM CHLORIDE 0.9 % IV SOLN: INTRAVENOUS | Status: AC

## 2024-05-09 NOTE — Progress Notes (Signed)
 PROGRESS NOTE    Tiffany Black  FMW:[id number] DOB: 05/18/1934 DOA: 05/04/2024 PCP: Loreli Elsie Tiffany Mickey., MD    Brief Narrative:   [phone] resides at memory care [facility] with Hx of advanced dementia, chronic HFpEF, atrial fibrillation no longer anticoagulated, CKD 3 who presents with right wrist pain and right leg pain after a fall.  Subsequently found to have right intratrochanteric fracture, distal ulnar and radius fracture.  Patient underwent ORIF for the right femur fracture on 8/25.  Hand surgery was consulted who is planning on ORIF 8/30.  Assessment & Plan:  Principal Problem:   Closed right hip fracture, initial encounter [facility]) Active Problems:   Anxiety   Dementia with behavioral disturbance (HCC)   Persistent atrial fibrillation (HCC)   Acute renal failure superimposed on stage 3b chronic kidney disease (HCC)   Closed fracture of right distal radius and ulna   Displaced intertrochanteric fracture of right femur, initial encounter for closed fracture (HCC)    Right intertrochanteric femur fracture s/p ORIF 8/25 - Evaluated by orthopedic surgery.Transition to aspirin  81 mg twice daily postop.  Pain control on board.   Right distal ulnar/radius fracture - Imaging noting acute intra-articular, comminuted, and displaced distal radius metadiaphysis fracture and displaced and comminuted distal ulnar fracture.  Sugar-tong splint.  Ortho planning on ORIF on Saturday.    acute kidney injury on CKD 3B Hyperkalemia - Baseline creatinine 1.3, trending upwards during this admission.  Creatinine 3.24.  With IV fluids renal function has improved.  Renal ultrasound is unremarkable.   Acute anemia - Suspect blood loss.  Baseline hemoglobin 9.0, drifted down 6.6 postoperatively.  Discussed with orthopedic who recommends conservative management and transfusion as needed.    Permanent atrial fibrillation - Patient does not take anticoagulation.  Continue metoprolol .   Chronic HFpEF -  Holding diuretics.  Does not appear to be acutely exacerbated.   Dementia/anxiety - Pleasantly demented, does not remember falling or her injuries.  Will continue home medications once able to tolerate p.o.   Hypertension - Norvasc , metoprolol .  IV as needed     DVT prophylaxis: SCDs Start: 05/05/24 0015    Code Status: Full Code Family Communication:   Status is: Inpatient Remains inpatient appropriate because: Pleasantly confused, ongoing evaluation for her right wrist fracture   PT Follow up Recs: Skilled Nursing-Short Term Rehab (<3 Hours/Day) (Or Return To [facility] If Staff Can Give Tot A)05/06/2024 1618  Subjective: Pleasantly confused no complaints besides talking about her pain in the right wrist as expected  Examination:  General exam: Appears calm and comfortable, elderly frail Respiratory system: Clear to auscultation. Respiratory effort normal. Cardiovascular system: S1 & S2 heard, RRR. No JVD, murmurs, rubs, gallops or clicks. No pedal edema. Gastrointestinal system: Abdomen is nondistended, soft and nontender. No organomegaly or masses felt. Normal bowel sounds heard. Central nervous system: Alert and oriented to name only, baseline. No focal neurological deficits. Extremities: Symmetric 4 x 5 power. Skin: No rashes, lesions or ulcers Psychiatry: Judgement and insight appear poor                Diet Orders (From admission, onward)     Start     Ordered   05/10/24 0001  Diet NPO time specified Except for: Sips with Meds, Ice Chips  Diet effective midnight       Question Answer Comment  Except for Sips with Meds   Except for Ice Chips      05/07/24 1306   05/07/24 1149  Diet Heart Room service appropriate? Yes; Fluid consistency: Thin  Diet effective now       Question Answer Comment  Room service appropriate? Yes   Fluid consistency: Thin      05/07/24 1148            Objective: Vitals:   05/08/24 2102 05/09/24 0353 05/09/24 0500  05/09/24 0900  BP: (!) 104/52 102/77  111/65  Pulse: (!) 103 98  70  Resp:  16  18  Temp:  97.8 F (36.6 C)  97.8 F (36.6 C)  TempSrc:    Oral  SpO2: 99% 95%  95%  Weight:   81.4 kg   Height:        Intake/Output Summary (Last 24 hours) at 05/09/2024 1111 Last data filed at 05/09/2024 0900 Gross per 24 hour  Intake 943.08 ml  Output 700 ml  Net 243.08 ml   Filed Weights   05/06/24 0704 05/08/24 0500 05/09/24 0500  Weight: 82.5 kg 83.2 kg 81.4 kg    Scheduled Meds:  acetaminophen   1,000 mg Oral Q8H   amLODipine   10 mg Oral Daily   aspirin   81 mg Oral BID   busPIRone   7.5 mg Oral TID   feeding supplement  237 mL Oral BID BM   FLUoxetine   10 mg Oral Daily   LORazepam   0.5 mg Oral Q1200   metoprolol  tartrate  25 mg Oral BID   QUEtiapine   50 mg Oral QHS   Continuous Infusions:  sodium chloride  75 mL/hr at 05/09/24 1034   tranexamic acid       Nutritional status Signs/Symptoms: estimated needs Interventions: Ensure Enlive (each supplement provides 350kcal and 20 grams of protein), MVI, Liberalize Diet Body mass index is 26.5 kg/m.  Data Reviewed:   CBC: Recent Labs  Lab 05/04/24 2023 05/05/24 0625 05/05/24 1523 05/06/24 0600 05/07/24 0501 05/07/24 1545 05/08/24 0753  WBC 8.2 11.3*  --  12.7* 13.4*  --  9.4  NEUTROABS 6.0  --   --   --   --   --   --   HGB 13.4 11.5* 10.9* 8.3* 6.6* 7.6* 7.6*  HCT 40.7 35.1* 32.0* 25.8* 20.7* 22.8* 22.6*  MCV 95.3 95.9  --  98.5 99.0  --  94.2  PLT 224 167  --  124* 94*  --  93*   Basic Metabolic Panel: Recent Labs  Lab 05/06/24 0600 05/06/24 1410 05/07/24 0501 05/08/24 0753 05/09/24 0456  NA 134* 134* 136 136 135  K 6.2* 5.8* 5.2* 3.7 5.1  CL 107 105 104 111 108  CO2 16* 13* 19* 18* 16*  GLUCOSE 162* 135* 107* 88 94  BUN 43* 50* 58* 49* 48*  CREATININE 2.35* 2.73* 3.24* 1.92* 1.72*  CALCIUM  8.8* 8.4* 8.8* 7.2* 8.6*  MG  --   --   --  1.7 2.2   GFR: Estimated Creatinine Clearance: 25.8 mL/min (A) (by C-G  formula based on SCr of 1.72 mg/dL (H)). Liver Function Tests: No results for input(s): AST, ALT, ALKPHOS, BILITOT, PROT, ALBUMIN  in the last 168 hours. No results for input(s): LIPASE, AMYLASE in the last 168 hours. No results for input(s): AMMONIA in the last 168 hours. Coagulation Profile: No results for input(s): INR, PROTIME in the last 168 hours. Cardiac Enzymes: No results for input(s): CKTOTAL, CKMB, CKMBINDEX, TROPONINI in the last 168 hours. BNP (last 3 results) No results for input(s): PROBNP in the last 8760 hours. HbA1C: No results for input(s): HGBA1C in the last 72  hours. CBG: No results for input(s): GLUCAP in the last 168 hours. Lipid Profile: No results for input(s): CHOL, HDL, LDLCALC, TRIG, CHOLHDL, LDLDIRECT in the last 72 hours. Thyroid  Function Tests: No results for input(s): TSH, T4TOTAL, FREET4, T3FREE, THYROIDAB in the last 72 hours. Anemia Panel: No results for input(s): VITAMINB12, FOLATE, FERRITIN, TIBC, IRON, RETICCTPCT in the last 72 hours. Sepsis Labs: No results for input(s): PROCALCITON, LATICACIDVEN in the last 168 hours.  Recent Results (from the past 240 hours)  Surgical PCR screen     Status: None   Collection Time: 05/05/24  2:19 AM   Specimen: Nasal Mucosa; Nasal Swab  Result Value Ref Range Status   MRSA, PCR NEGATIVE NEGATIVE Final   Staphylococcus aureus NEGATIVE NEGATIVE Final    Comment: (NOTE) The Xpert SA Assay (FDA approved for NASAL specimens in patients 61 years of age and older), is one component of a comprehensive surveillance program. It is not intended to diagnose infection nor to guide or monitor treatment. Performed at [facility], [address], [place], [place] [address]          Radiology Studies: US  RENAL Result Date: 05/07/2024 CLINICAL DATA:  Acute renal injury EXAM: RENAL / URINARY TRACT ULTRASOUND COMPLETE COMPARISON:  None  Available. FINDINGS: Right Kidney: Renal measurements: 8.9 x 4.7 x 5.1 cm = volume: 111 mL. Echogenicity within normal limits. No mass or hydronephrosis visualized. Left Kidney: Renal measurements: 10.8 x 5.3 x 5.2 cm = volume: 154 mL. Small anechoic 1 cm cyst. No hydronephrosis Bladder: Appears normal for degree of bladder distention. Other: None. IMPRESSION: 1. No hydronephrosis. 2. Small LEFT renal cyst. Electronically Signed   By: Jackquline Boxer M.D.   On: 05/07/2024 18:48           LOS: 5 days   Time spent= 35 mins    Burgess JAYSON Dare, MD [facility]  If 7PM-7AM, please contact night-coverage  05/09/2024, 11:11 AM

## 2024-05-09 NOTE — Progress Notes (Signed)
 Orthopedic Tech Progress Note Patient Details:  Tiffany Black 11/28/1933 [id number]  Ortho Devices Type of Ortho Device: Sugartong splint, Cotton web roll, Ace wrap Ortho Device/Splint Location: RLU Ortho Device/Splint Interventions: Ordered, Application, Adjustment   Post Interventions Patient Tolerated: Fair Instructions Provided: Care of device Nurse called and said the patient ripped the splint and wanted us  to redo the splint. Re-applied splint with AM ortho tech.  [name] 05/09/2024, 7:34 AM

## 2024-05-09 NOTE — Evaluation (Deleted)
 Physical Therapy Evaluation Patient Details Name: Tiffany Black MRN: [id number] DOB: December 09, 1933 Today's Date: 05/09/2024  History of Present Illness  Pt is an 89 y/o F presenting to ED on 8/24 from SNF after fall, found to have R intertrochanteric femur fx and R distal ulnar/radius fx. S/p R femur ORIF, R distal radius fx managed in splint. Hand surgery was consulted who is planning on ORIF 8/30. PMH includes CVA, HTN, dementia, anxiety, chronic HFpEF, A fib, CKD III   Clinical Impression  The pt is making gradual progress as she was able to sit EOB x15 minutes and come to a half stand with maxA and L UE today. However, her cognitive deficits impact her initiation and sequencing with mobility. Her pain also limits her, resulting in her needing max-total assist for bed mobility. She also continues to lean laterally to the L and posteriorly to likely try to offload her R hip due to pain when sitting EOB. Focused part of the session on A/AAROM exercises for her lower extremities to reduce stiffness and pain and improve ROM and strength. Will continue to follow acutely.      If plan is discharge home, recommend the following: Two people to help with walking and/or transfers;Two people to help with bathing/dressing/bathroom;Assistance with feeding;Assistance with cooking/housework;Assist for transportation;Help with stairs or ramp for entrance;Direct supervision/assist for financial management;Direct supervision/assist for medications management;Supervision due to cognitive status   Can travel by private vehicle   No    Equipment Recommendations Wheelchair (measurements PT);Wheelchair cushion (measurements PT);BSC/3in1;Hospital bed;Hoyer lift  Recommendations for Other Services       Functional Status Assessment       Precautions / Restrictions Precautions Precautions: Fall Recall of Precautions/Restrictions: Impaired Precaution/Restrictions Comments: bil mittens Restrictions Weight  Bearing Restrictions Per Provider Order: Yes RUE Weight Bearing Per Provider Order: Non weight bearing RLE Weight Bearing Per Provider Order: Weight bearing as tolerated      Mobility  Bed Mobility Overal bed mobility: Needs Assistance Bed Mobility: Supine to Sit, Sit to Supine, Rolling Rolling: Total assist   Supine to sit: Max assist, HOB elevated Sit to supine: Total assist   General bed mobility comments: Provided pt with step-by-step cues to move one leg at a time towards and off L EOB, needing maxA at bil legs to do so. MaxA using bed pad to scoot and pivot hips and ascend trunk to sit up L EOB. Pt required total assist to pivot her hips and lift her legs and direct her trunk safely back to supine after sitting EOB x15 min. Poor initiation noted when cued to pull on therapist or rail to roll, needing total assist to roll bil for bed pad change.    Transfers Overall transfer level: Needs assistance Equipment used: 1 person hand held assist Transfers: Sit to/from Stand Sit to Stand: Max assist           General transfer comment: Bil knees blocked and son guarding pt not to use her R UE. Pt holding onto therapist anterior to her with her L UE as cued. MaxA to power up to a half stand from EOB, successful x1 rep out of x2 attempts.    Ambulation/Gait               General Gait Details: unable at this time as pt unable to come to a full stand yet  Careers information officer     Tilt Bed  Modified Rankin (Stroke Patients Only)       Balance Overall balance assessment: Needs assistance Sitting-balance support: Feet supported, Single extremity supported Sitting balance-Leahy Scale: Poor Sitting balance - Comments: Pt leans posteriorly and laterally to the L the longer she sits EOB, initially only needing CGA-minA then progressing to intermittent modA as her directional lean intensity increased. Pt able to sustain her balance better when holding  onto bed rail with L UE. Pt sat EOB x15 min Postural control: Left lateral lean, Posterior lean Standing balance support: Single extremity supported, During functional activity Standing balance-Leahy Scale: Poor Standing balance comment: L UE and bil knee block with maxA to come to a half stand                             Pertinent Vitals/Pain Pain Assessment Pain Assessment: Faces Faces Pain Scale: Hurts even more Pain Location: R leg and R UE Pain Descriptors / Indicators: Discomfort, Grimacing, Guarding, Moaning Pain Intervention(s): Limited activity within patient's tolerance, Monitored during session, Repositioned    Home Living                          Prior Function                       Extremity/Trunk Assessment                Communication   Communication Communication: No apparent difficulties    Cognition Arousal: Alert Behavior During Therapy: Flat affect, Restless   PT - Cognitive impairments: History of cognitive impairments                       PT - Cognition Comments: dementia baseline. Pt alert with eyes open upon arrival but pt would often intermittently close her eyes during conversation and need frequent cues to keep eyes open while mobilizing. Pt not recalling who her son was initially but then recalled who he was later. Pt's train of thought jumps often between pain, her mother, and other family members. Pt not oriented to situation and needed reminders that she was at [facility]. Poor awareness of her posterior and L lean, needing repeated cues to keep L hand on bed rail to improve balance, but pt restlessly moving L UE Following commands: Impaired Following commands impaired: Follows one step commands inconsistently, Follows one step commands with increased time     Cueing Cueing Techniques: Verbal cues, Gestural cues, Tactile cues, Visual cues     General Comments      Exercises General Exercises - Lower  Extremity Ankle Circles/Pumps: AROM, Both, 10 reps, Supine Quad Sets: AAROM, Both, 5 reps, Supine Long Arc Quad: AROM, Strengthening, Both, 5 reps, Seated Heel Slides: AAROM, Both, 5 reps, Supine Hip ABduction/ADduction: AAROM, Both, 5 reps, Supine   Assessment/Plan    PT Assessment    PT Problem List         PT Treatment Interventions      PT Goals (Current goals can be found in the Care Plan section)  Acute Rehab PT Goals Patient Stated Goal: to reduce pain PT Goal Formulation: With family Time For Goal Achievement: 05/20/24 Potential to Achieve Goals: Fair    Frequency Min 1X/week     Co-evaluation               AM-PAC PT 6 Clicks Mobility  Outcome Measure Help  needed turning from your back to your side while in a flat bed without using bedrails?: Total Help needed moving from lying on your back to sitting on the side of a flat bed without using bedrails?: A Lot Help needed moving to and from a bed to a chair (including a wheelchair)?: Total Help needed standing up from a chair using your arms (e.g., wheelchair or bedside chair)?: Total Help needed to walk in hospital room?: Total Help needed climbing 3-5 steps with a railing? : Total 6 Click Score: 7    End of Session   Activity Tolerance: Patient limited by pain Patient left: in bed;with bed alarm set;with call bell/phone within reach;with family/visitor present;with restraints reapplied Nurse Communication: Mobility status PT Visit Diagnosis: Pain;History of falling (Z91.81);Difficulty in walking, not elsewhere classified (R26.2);Unsteadiness on feet (R26.81);Other abnormalities of gait and mobility (R26.89);Muscle weakness (generalized) (M62.81) Pain - Right/Left: Right Pain - part of body: Hip;Arm    Time: 8480-8443 PT Time Calculation (min) (ACUTE ONLY): 37 min   Charges:     PT Treatments $Therapeutic Exercise: 8-22 mins $Therapeutic Activity: 8-22 mins PT General Charges $$ ACUTE PT VISIT:  1 Visit         Theo Ferretti, PT, DPT Acute Rehabilitation Services  Office: [phone]   Theo CHRISTELLA Ferretti 05/09/2024, 4:29 PM

## 2024-05-09 NOTE — Progress Notes (Signed)
 Physical Therapy Treatment Patient Details Name: Tiffany Black MRN: [id number] DOB: [birth year] Today's Date: 05/09/2024   History of Present Illness Pt is an 89 y/o F presenting to ED on 8/24 from SNF after fall, found to have R intertrochanteric femur fx and R distal ulnar/radius fx. S/p R femur ORIF, R distal radius fx managed in splint. Hand surgery was consulted who is planning on ORIF 8/30. PMH includes CVA, HTN, dementia, anxiety, chronic HFpEF, A fib, CKD III    PT Comments  The pt is making gradual progress as she was able to sit EOB x15 minutes and come to a half stand with maxA and L UE today. However, her cognitive deficits impact her initiation and sequencing with mobility. Her pain also limits her, resulting in her needing max-total assist for bed mobility. She also continues to lean laterally to the L and posteriorly to likely try to offload her R hip due to pain when sitting EOB. Focused part of the session on A/AAROM exercises for her lower extremities to reduce stiffness and pain and improve ROM and strength. Will continue to follow acutely.     If plan is discharge home, recommend the following: Two people to help with walking and/or transfers;Two people to help with bathing/dressing/bathroom;Assistance with feeding;Assistance with cooking/housework;Assist for transportation;Help with stairs or ramp for entrance;Direct supervision/assist for financial management;Direct supervision/assist for medications management;Supervision due to cognitive status   Can travel by private vehicle     No  Equipment Recommendations  Wheelchair (measurements PT);Wheelchair cushion (measurements PT);BSC/3in1;Hospital bed;Hoyer lift    Recommendations for Other Services       Precautions / Restrictions Precautions Precautions: Fall Recall of Precautions/Restrictions: Impaired Precaution/Restrictions Comments: bil mittens Restrictions Weight Bearing Restrictions Per Provider Order:  Yes RUE Weight Bearing Per Provider Order: Non weight bearing RLE Weight Bearing Per Provider Order: Weight bearing as tolerated     Mobility  Bed Mobility Overal bed mobility: Needs Assistance Bed Mobility: Supine to Sit, Sit to Supine, Rolling Rolling: Total assist   Supine to sit: Max assist, HOB elevated Sit to supine: Total assist   General bed mobility comments: Provided pt with step-by-step cues to move one leg at a time towards and off L EOB, needing maxA at bil legs to do so. MaxA using bed pad to scoot and pivot hips and ascend trunk to sit up L EOB. Pt required total assist to pivot her hips and lift her legs and direct her trunk safely back to supine after sitting EOB x15 min. Poor initiation noted when cued to pull on therapist or rail to roll, needing total assist to roll bil for bed pad change.    Transfers Overall transfer level: Needs assistance Equipment used: 1 person hand held assist Transfers: Sit to/from Stand Sit to Stand: Max assist           General transfer comment: Bil knees blocked and son guarding pt not to use her R UE. Pt holding onto therapist anterior to her with her L UE as cued. MaxA to power up to a half stand from EOB, successful x1 rep out of x2 attempts.    Ambulation/Gait               General Gait Details: unable at this time as pt unable to come to a full stand yet   Stairs             Wheelchair Mobility     Tilt Bed    Modified Rankin (Stroke  Patients Only)       Balance Overall balance assessment: Needs assistance Sitting-balance support: Feet supported, Single extremity supported Sitting balance-Leahy Scale: Poor Sitting balance - Comments: Pt leans posteriorly and laterally to the L the longer she sits EOB, initially only needing CGA-minA then progressing to intermittent modA as her directional lean intensity increased. Pt able to sustain her balance better when holding onto bed rail with L UE. Pt sat EOB  x15 min Postural control: Left lateral lean, Posterior lean Standing balance support: Single extremity supported, During functional activity Standing balance-Leahy Scale: Poor Standing balance comment: L UE and bil knee block with maxA to come to a half stand                            Communication Communication Communication: No apparent difficulties  Cognition Arousal: Alert Behavior During Therapy: Flat affect, Restless   PT - Cognitive impairments: History of cognitive impairments                       PT - Cognition Comments: dementia baseline. Pt alert with eyes open upon arrival but pt would often intermittently close her eyes during conversation and need frequent cues to keep eyes open while mobilizing. Pt not recalling who her son was initially but then recalled who he was later. Pt's train of thought jumps often between pain, her mother, and other family members. Pt not oriented to situation and needed reminders that she was at [facility]. Poor awareness of her posterior and L lean, needing repeated cues to keep L hand on bed rail to improve balance, but pt restlessly moving L UE Following commands: Impaired Following commands impaired: Follows one step commands inconsistently, Follows one step commands with increased time    Cueing Cueing Techniques: Verbal cues, Gestural cues, Tactile cues, Visual cues  Exercises General Exercises - Lower Extremity Ankle Circles/Pumps: AROM, Both, 10 reps, Supine Quad Sets: AAROM, Both, 5 reps, Supine Long Arc Quad: AROM, Strengthening, Both, 5 reps, Seated Heel Slides: AAROM, Both, 5 reps, Supine Hip ABduction/ADduction: AAROM, Both, 5 reps, Supine    General Comments        Pertinent Vitals/Pain Pain Assessment Pain Assessment: Faces Faces Pain Scale: Hurts even more Pain Location: R leg and R UE Pain Descriptors / Indicators: Discomfort, Grimacing, Guarding, Moaning Pain Intervention(s): Limited activity within  patient's tolerance, Monitored during session, Repositioned    Home Living                          Prior Function            PT Goals (current goals can now be found in the care plan section) Acute Rehab PT Goals Patient Stated Goal: to reduce pain PT Goal Formulation: With family Time For Goal Achievement: 05/20/24 Potential to Achieve Goals: Fair Progress towards PT goals: Progressing toward goals    Frequency    Min 1X/week      PT Plan      Co-evaluation              AM-PAC PT 6 Clicks Mobility   Outcome Measure  Help needed turning from your back to your side while in a flat bed without using bedrails?: Total Help needed moving from lying on your back to sitting on the side of a flat bed without using bedrails?: A Lot Help needed moving to and from a  bed to a chair (including a wheelchair)?: Total Help needed standing up from a chair using your arms (e.g., wheelchair or bedside chair)?: Total Help needed to walk in hospital room?: Total Help needed climbing 3-5 steps with a railing? : Total 6 Click Score: 7    End of Session   Activity Tolerance: Patient limited by pain Patient left: in bed;with bed alarm set;with call bell/phone within reach;with family/visitor present;with restraints reapplied Nurse Communication: Mobility status PT Visit Diagnosis: Pain;History of falling (Z91.81);Difficulty in walking, not elsewhere classified (R26.2);Unsteadiness on feet (R26.81);Other abnormalities of gait and mobility (R26.89);Muscle weakness (generalized) (M62.81) Pain - Right/Left: Right Pain - part of body: Hip;Arm     Time: 8480-8443 PT Time Calculation (min) (ACUTE ONLY): 37 min  Charges:    $Therapeutic Exercise: 8-22 mins $Therapeutic Activity: 8-22 mins PT General Charges $$ ACUTE PT VISIT: 1 Visit                     Theo Ferretti, PT, DPT Acute Rehabilitation Services  Office: [phone]    Theo CHRISTELLA Ferretti 05/09/2024, 4:33  PM

## 2024-05-09 NOTE — Progress Notes (Signed)
 Pt. Removed splint from right arm. Ortho tech paged to replace

## 2024-05-10 ENCOUNTER — Other Ambulatory Visit: Payer: Self-pay

## 2024-05-10 ENCOUNTER — Inpatient Hospital Stay (HOSPITAL_COMMUNITY): Payer: Self-pay | Admitting: Anesthesiology

## 2024-05-10 ENCOUNTER — Inpatient Hospital Stay (HOSPITAL_COMMUNITY)

## 2024-05-10 ENCOUNTER — Encounter (HOSPITAL_COMMUNITY)
Admission: EM | Disposition: A | Discharge status: Skilled Nursing Facility | Payer: Self-pay | Source: Skilled Nursing Facility | Attending: Internal Medicine

## 2024-05-10 ENCOUNTER — Encounter (HOSPITAL_COMMUNITY): Payer: Self-pay | Admitting: Family Medicine

## 2024-05-10 DIAGNOSIS — I5033 Acute on chronic diastolic (congestive) heart failure: Secondary | ICD-10-CM

## 2024-05-10 DIAGNOSIS — N1832 Chronic kidney disease, stage 3b: Secondary | ICD-10-CM | POA: Diagnosis not present

## 2024-05-10 DIAGNOSIS — S52571A Other intraarticular fracture of lower end of right radius, initial encounter for closed fracture: Secondary | ICD-10-CM | POA: Diagnosis not present

## 2024-05-10 DIAGNOSIS — I130 Hypertensive heart and chronic kidney disease with heart failure and stage 1 through stage 4 chronic kidney disease, or unspecified chronic kidney disease: Secondary | ICD-10-CM

## 2024-05-10 DIAGNOSIS — S72001A Fracture of unspecified part of neck of right femur, initial encounter for closed fracture: Secondary | ICD-10-CM | POA: Diagnosis not present

## 2024-05-10 HISTORY — PX: ORIF WRIST FRACTURE: SHX2133

## 2024-05-10 LAB — CBC
HCT: 23.1 % — ABNORMAL LOW (ref 36.0–46.0)
Hemoglobin: 7.3 g/dL — ABNORMAL LOW (ref 12.0–15.0)
MCH: 30.7 pg (ref 26.0–34.0)
MCHC: 31.6 g/dL (ref 30.0–36.0)
MCV: 97.1 fL (ref 80.0–100.0)
Platelets: 122 K/uL — ABNORMAL LOW (ref 150–400)
RBC: 2.38 MIL/uL — ABNORMAL LOW (ref 3.87–5.11)
RDW: 14.3 % (ref 11.5–15.5)
WBC: 8 K/uL (ref 4.0–10.5)
nRBC: 0 % (ref 0.0–0.2)

## 2024-05-10 LAB — BASIC METABOLIC PANEL WITH GFR
Anion gap: 7 (ref 5–15)
BUN: 35 mg/dL — ABNORMAL HIGH (ref 8–23)
CO2: 19 mmol/L — ABNORMAL LOW (ref 22–32)
Calcium: 8.7 mg/dL — ABNORMAL LOW (ref 8.9–10.3)
Chloride: 108 mmol/L (ref 98–111)
Creatinine, Ser: 1.31 mg/dL — ABNORMAL HIGH (ref 0.44–1.00)
GFR, Estimated: 39 mL/min — ABNORMAL LOW (ref >60)
Glucose, Bld: 155 mg/dL — ABNORMAL HIGH (ref 70–99)
Potassium: 5.7 mmol/L — ABNORMAL HIGH (ref 3.5–5.1)
Sodium: 134 mmol/L — ABNORMAL LOW (ref 135–145)

## 2024-05-10 LAB — POCT I-STAT, CHEM 8
BUN: 34 mg/dL — ABNORMAL HIGH (ref 8–23)
Calcium, Ion: 1.34 mmol/L (ref 1.15–1.40)
Chloride: 108 mmol/L (ref 98–111)
Creatinine, Ser: 1.3 mg/dL — ABNORMAL HIGH (ref 0.44–1.00)
Glucose, Bld: 91 mg/dL (ref 70–99)
HCT: 21 % — ABNORMAL LOW (ref 36.0–46.0)
Hemoglobin: 7.1 g/dL — ABNORMAL LOW (ref 12.0–15.0)
Potassium: 4.6 mmol/L (ref 3.5–5.1)
Sodium: 137 mmol/L (ref 135–145)
TCO2: 22 mmol/L (ref 22–32)

## 2024-05-10 LAB — MAGNESIUM: Magnesium: 2 mg/dL (ref 1.7–2.4)

## 2024-05-10 SURGERY — OPEN REDUCTION INTERNAL FIXATION (ORIF) WRIST FRACTURE
Anesthesia: General | Site: Wrist | Laterality: Right

## 2024-05-10 MED ORDER — PHENYLEPHRINE HCL (PRESSORS) 10 MG/ML IV SOLN
INTRAVENOUS | Status: DC | PRN
Start: 2024-05-10 — End: 2024-05-10
  Administered 2024-05-10: 100 ug via INTRAVENOUS
  Administered 2024-05-10: 80 ug via INTRAVENOUS

## 2024-05-10 MED ORDER — FENTANYL CITRATE (PF) 250 MCG/5ML IJ SOLN
INTRAMUSCULAR | Status: AC
  Filled 2024-05-10: qty 5

## 2024-05-10 MED ORDER — PROPOFOL 10 MG/ML IV BOLUS
INTRAVENOUS | Status: DC | PRN
  Administered 2024-05-10: 140 mg via INTRAVENOUS

## 2024-05-10 MED ORDER — PROPOFOL 10 MG/ML IV BOLUS
INTRAVENOUS | Status: AC
  Filled 2024-05-10: qty 20

## 2024-05-10 MED ORDER — PHENYLEPHRINE HCL-NACL 20-0.9 MG/250ML-% IV SOLN
INTRAVENOUS | Status: DC | PRN
  Administered 2024-05-10: 50 ug/min via INTRAVENOUS

## 2024-05-10 MED ORDER — CEFAZOLIN SODIUM-DEXTROSE 2-4 GM/100ML-% IV SOLN: 2.0000 g | INTRAVENOUS | Status: DC

## 2024-05-10 MED ORDER — BUPIVACAINE HCL (PF) 0.25 % IJ SOLN
INTRAMUSCULAR | Status: AC
  Filled 2024-05-10: qty 30

## 2024-05-10 MED ORDER — LIDOCAINE 2% (20 MG/ML) 5 ML SYRINGE
INTRAMUSCULAR | Status: AC
  Filled 2024-05-10: qty 5

## 2024-05-10 MED ORDER — MIDAZOLAM HCL 2 MG/2ML IJ SOLN
INTRAMUSCULAR | Status: AC
  Filled 2024-05-10: qty 2

## 2024-05-10 MED ORDER — DEXAMETHASONE SODIUM PHOSPHATE 10 MG/ML IJ SOLN
INTRAMUSCULAR | Status: DC | PRN
  Administered 2024-05-10: 10 mg via INTRAVENOUS

## 2024-05-10 MED ORDER — PHENYLEPHRINE 80 MCG/ML (10ML) SYRINGE FOR IV PUSH (FOR BLOOD PRESSURE SUPPORT)
PREFILLED_SYRINGE | INTRAVENOUS | Status: AC
Start: 2024-05-10 — End: 2024-05-10
  Filled 2024-05-10: qty 10

## 2024-05-10 MED ORDER — ORAL CARE MOUTH RINSE: 15.0000 mL | Freq: Once | OROMUCOSAL | Status: AC

## 2024-05-10 MED ORDER — DEXAMETHASONE SODIUM PHOSPHATE 10 MG/ML IJ SOLN
INTRAMUSCULAR | Status: AC
  Filled 2024-05-10: qty 1

## 2024-05-10 MED ORDER — CHLORHEXIDINE GLUCONATE 0.12 % MT SOLN
15.0000 mL | Freq: Once | OROMUCOSAL | Status: AC
  Administered 2024-05-10: 15 mL via OROMUCOSAL

## 2024-05-10 MED ORDER — VANCOMYCIN HCL 1000 MG IV SOLR
INTRAVENOUS | Status: DC | PRN
Start: 2024-05-10 — End: 2024-05-10
  Administered 2024-05-10: 1000 mg via INTRAVENOUS

## 2024-05-10 MED ORDER — FENTANYL CITRATE (PF) 250 MCG/5ML IJ SOLN
INTRAMUSCULAR | Status: DC | PRN
  Administered 2024-05-10: 50 ug via INTRAVENOUS
  Administered 2024-05-10: 100 ug via INTRAVENOUS

## 2024-05-10 MED ORDER — ONDANSETRON HCL 4 MG/2ML IJ SOLN
INTRAMUSCULAR | Status: DC | PRN
  Administered 2024-05-10: 4 mg via INTRAVENOUS

## 2024-05-10 MED ORDER — CHLORHEXIDINE GLUCONATE 4 % EX SOLN: 60.0000 mL | Freq: Once | CUTANEOUS | Status: DC

## 2024-05-10 MED ORDER — 0.9 % SODIUM CHLORIDE (POUR BTL) OPTIME
TOPICAL | Status: DC | PRN
  Administered 2024-05-10: 1000 mL

## 2024-05-10 MED ORDER — CHLORHEXIDINE GLUCONATE 0.12 % MT SOLN
OROMUCOSAL | Status: AC
  Filled 2024-05-10: qty 15

## 2024-05-10 MED ORDER — POVIDONE-IODINE 10 % EX SWAB: 2.0000 | Freq: Once | CUTANEOUS | Status: DC

## 2024-05-10 MED ORDER — FENTANYL CITRATE (PF) 100 MCG/2ML IJ SOLN: 25.0000 ug | INTRAMUSCULAR | Status: DC | PRN

## 2024-05-10 MED ORDER — AMISULPRIDE (ANTIEMETIC) 5 MG/2ML IV SOLN: 10.0000 mg | Freq: Once | INTRAVENOUS | Status: DC | PRN

## 2024-05-10 MED ORDER — VANCOMYCIN HCL IN DEXTROSE 1-5 GM/200ML-% IV SOLN
INTRAVENOUS | Status: AC
  Filled 2024-05-10: qty 200

## 2024-05-10 MED ORDER — LACTATED RINGERS IV SOLN: INTRAVENOUS | Status: DC

## 2024-05-10 MED ORDER — ONDANSETRON HCL 4 MG/2ML IJ SOLN
INTRAMUSCULAR | Status: AC
  Filled 2024-05-10: qty 2

## 2024-05-10 MED ORDER — MIDAZOLAM HCL 2 MG/2ML IJ SOLN
INTRAMUSCULAR | Status: DC | PRN
  Administered 2024-05-10: 2 mg via INTRAVENOUS

## 2024-05-10 MED ORDER — LIDOCAINE 2% (20 MG/ML) 5 ML SYRINGE
INTRAMUSCULAR | Status: DC | PRN
  Administered 2024-05-10: 40 mg via INTRAVENOUS

## 2024-05-10 SURGICAL SUPPLY — 53 items
BAG COUNTER SPONGE SURGICOUNT (BAG) ×1 IMPLANT
BIT DRILL SOLID 2.0X40MM (BIT) IMPLANT
BIT DRILL SOLID 2.5X40MM (BIT) IMPLANT
BLADE CLIPPER SURG (BLADE) IMPLANT
BNDG COMPR ESMARK 4X3 LF (GAUZE/BANDAGES/DRESSINGS) ×1 IMPLANT
BNDG ELASTIC 3INX 5YD STR LF (GAUZE/BANDAGES/DRESSINGS) ×1 IMPLANT
BNDG ELASTIC 4INX 5YD STR LF (GAUZE/BANDAGES/DRESSINGS) IMPLANT
BNDG ELASTIC 4X5.8 VLCR STR LF (GAUZE/BANDAGES/DRESSINGS) ×1 IMPLANT
BNDG GAUZE DERMACEA FLUFF 4 (GAUZE/BANDAGES/DRESSINGS) ×1 IMPLANT
CORD BIPOLAR FORCEPS 12FT (ELECTRODE) ×1 IMPLANT
COVER SURGICAL LIGHT HANDLE (MISCELLANEOUS) ×1 IMPLANT
CUFF TOURN SGL QUICK 18X4 (TOURNIQUET CUFF) ×1 IMPLANT
CUFF TRNQT CYL 24X4X16.5-23 (TOURNIQUET CUFF) IMPLANT
DRAIN TLS ROUND 10FR (DRAIN) IMPLANT
DRAPE OEC MINIVIEW 54X84 (DRAPES) ×1 IMPLANT
DRAPE SURG 17X11 SM STRL (DRAPES) ×1 IMPLANT
DRSG ADAPTIC 3X8 NADH LF (GAUZE/BANDAGES/DRESSINGS) ×1 IMPLANT
ELECTRODE REM PT RTRN 9FT ADLT (ELECTROSURGICAL) IMPLANT
GAUZE SPONGE 4X4 12PLY STRL (GAUZE/BANDAGES/DRESSINGS) ×1 IMPLANT
GAUZE XEROFORM 1X8 LF (GAUZE/BANDAGES/DRESSINGS) IMPLANT
GLOVE BIOGEL PI IND STRL 8.5 (GLOVE) ×1 IMPLANT
GLOVE SURG ORTHO 8.0 STRL STRW (GLOVE) ×1 IMPLANT
GOWN STRL REUS W/ TWL LRG LVL3 (GOWN DISPOSABLE) ×3 IMPLANT
GOWN STRL REUS W/ TWL XL LVL3 (GOWN DISPOSABLE) ×1 IMPLANT
GUIDE AIMING 1.5MM (WIRE) IMPLANT
KIT BASIN OR (CUSTOM PROCEDURE TRAY) ×1 IMPLANT
KIT TURNOVER KIT B (KITS) ×1 IMPLANT
MANIFOLD NEPTUNE II (INSTRUMENTS) ×1 IMPLANT
NDL HYPO 25X1 1.5 SAFETY (NEEDLE) ×1 IMPLANT
NEEDLE HYPO 25X1 1.5 SAFETY (NEEDLE) ×1 IMPLANT
NS IRRIG 1000ML POUR BTL (IV SOLUTION) ×1 IMPLANT
PACK ORTHO EXTREMITY (CUSTOM PROCEDURE TRAY) ×1 IMPLANT
PAD ARMBOARD POSITIONER FOAM (MISCELLANEOUS) ×2 IMPLANT
PAD CAST 4YDX4 CTTN HI CHSV (CAST SUPPLIES) ×1 IMPLANT
PEG GEMINUS SMOOTH LOCK 2.0X17 (Peg) IMPLANT
PEG SMOOTH LOCK 2.0X18 (Screw) IMPLANT
PLATE DVR NARROW (Plate) IMPLANT
SCREW CORT LOCK 3.5X10 TI (Screw) IMPLANT
SCREW CORT LOCK 3.5X12 TI (Screw) IMPLANT
SCREW NONLOCK POLYAXIAL 3.5X11 (Screw) IMPLANT
SCREWDRIVER SURG ST 2 (INSTRUMENTS) IMPLANT
SOAP 2 % CHG 4 OZ (WOUND CARE) ×1 IMPLANT
SPLINT FIBERGLASS 4X30 (CAST SUPPLIES) IMPLANT
SUT PROLENE 4 0 PS 2 18 (SUTURE) IMPLANT
SUT VIC AB 2-0 FS1 27 (SUTURE) IMPLANT
SUT VIC AB 4-0 PS2 18 (SUTURE) IMPLANT
SYR CONTROL 10ML LL (SYRINGE) IMPLANT
SYSTEM CHEST DRAIN TLS 7FR (DRAIN) IMPLANT
TOWEL GREEN STERILE (TOWEL DISPOSABLE) ×1 IMPLANT
TOWEL GREEN STERILE FF (TOWEL DISPOSABLE) ×1 IMPLANT
TUBE CONNECTING 12X1/4 (SUCTIONS) ×1 IMPLANT
WATER STERILE IRR 1000ML POUR (IV SOLUTION) ×1 IMPLANT
WIRE FIX 1.5 STD TIP (WIRE) IMPLANT

## 2024-05-10 NOTE — Transfer of Care (Signed)
 Immediate Anesthesia Transfer of Care Note  Patient: Tiffany Black  Procedure(s) Performed: OPEN REDUCTION INTERNAL FIXATION (ORIF) WRIST FRACTURE (Right: Wrist)  Patient Location: PACU  Anesthesia Type:General  Level of Consciousness: awake and alert   Airway & Oxygen Therapy: Patient Spontanous Breathing and Patient connected to face mask oxygen  Post-op Assessment: Report given to RN, Post -op Vital signs reviewed and stable, and Patient moving all extremities X 4  Post vital signs: Reviewed and stable  Last Vitals:  Vitals Value Taken Time  BP 107/58 05/10/24 09:15  Temp 36.6 C 05/10/24 09:15  Pulse 80 05/10/24 09:20  Resp 10 05/10/24 09:20  SpO2 97 % 05/10/24 09:20  Vitals shown include unfiled device data.  Last Pain:  Vitals:   05/10/24 0716  TempSrc: Oral  PainSc:          Complications: There were no known notable events for this encounter.

## 2024-05-10 NOTE — Progress Notes (Signed)
Pt off the unit to OR.  

## 2024-05-10 NOTE — Progress Notes (Signed)
 PROGRESS NOTE    Tiffany Black  FMW:[id number] DOB: 20-Dec-1933 DOA: 05/04/2024 PCP: Tiffany Elsie [name], MD    Brief Narrative:   [phone] resides at memory care [facility] with Hx of advanced dementia, chronic HFpEF, atrial fibrillation no longer anticoagulated, CKD 3 who presents with right wrist pain and right leg pain after a fall.  Subsequently found to have right intratrochanteric fracture, distal ulnar and radius fracture.  Patient underwent ORIF for the right femur fracture on 8/25.  Hand surgery was consulted who is planning on ORIF 8/30.  Assessment & Plan:  Principal Problem:   Closed right hip fracture, initial encounter [facility]) Active Problems:   Anxiety   Dementia with behavioral disturbance (HCC)   Persistent atrial fibrillation (HCC)   Acute renal failure superimposed on stage 3b chronic kidney disease (HCC)   Closed fracture of right distal radius and ulna   Displaced intertrochanteric fracture of right femur, initial encounter for closed fracture (HCC)    Right intertrochanteric femur fracture s/p ORIF 8/25 - Evaluated by orthopedic surgery.Transition to aspirin  81 mg twice daily postop.  Pain control on board.   Right distal ulnar/radius fracture - Status post ORIF by orthopedic 8/30.  Outpatient follow-up in 10-14 days.  Plan to keep the splint clean and dry.   acute kidney injury on CKD 3B Hyperkalemia - Baseline creatinine 1.3, trending upwards during this admission.  Creatinine 3.24.  With IV fluids renal function has improved.  Renal ultrasound is unremarkable.   Acute anemia - Suspect blood loss.  Baseline hemoglobin 9.0, drifted down 6.6 postoperatively.  Discussed with orthopedic who recommends conservative management and transfusion as needed.    Permanent atrial fibrillation - Patient does not take anticoagulation.  Continue metoprolol .   Chronic HFpEF - Holding diuretics.  Does not appear to be acutely exacerbated.   Dementia/anxiety -  Pleasantly demented, does not remember falling or her injuries.  Will continue home medications once able to tolerate p.o.   Hypertension - Norvasc , metoprolol .  IV as needed     DVT prophylaxis: SCDs Start: 05/05/24 0015    Code Status: Full Code Family Communication:   Status is: Inpatient Remains inpatient appropriate because: Pleasantly confused, ongoing evaluation for her right wrist fracture   PT Follow up Recs: Skilled Nursing-Short Term Rehab (<3 Hours/Day) (Or Return To [facility] If Staff Can Give Tot A)05/06/2024 1618  Subjective: No complaints of OR today Pleasantly confused  Examination:  General exam: Appears calm and comfortable, elderly frail Respiratory system: Clear to auscultation. Respiratory effort normal. Cardiovascular system: S1 & S2 heard, RRR. No JVD, murmurs, rubs, gallops or clicks. No pedal edema. Gastrointestinal system: Abdomen is nondistended, soft and nontender. No organomegaly or masses felt. Normal bowel sounds heard. Central nervous system: Alert and oriented to name only, baseline. No focal neurological deficits. Extremities: Symmetric 4 x 5 power. Skin: No rashes, lesions or ulcers Psychiatry: Judgement and insight appear poor                Diet Orders (From admission, onward)     Start     Ordered   05/10/24 0001  Diet NPO time specified Except for: Sips with Meds, Ice Chips  Diet effective midnight       Question Answer Comment  Except for Sips with Meds   Except for Ice Chips      05/07/24 1306            Objective: Vitals:   05/10/24 0716 05/10/24 0915 05/10/24 0930  05/10/24 0945  BP: 130/66 (!) 107/58 108/61 123/70  Pulse: 90 81 84 83  Resp: 16 11 10 10   Temp: 97.7 F (36.5 C) 97.8 F (36.6 C)  97.8 F (36.6 C)  TempSrc: Oral     SpO2: 99% 96% 98% 97%  Weight:      Height:        Intake/Output Summary (Last 24 hours) at 05/10/2024 1007 Last data filed at 05/10/2024 0920 Gross per 24 hour  Intake  1132.13 ml  Output 755 ml  Net 377.13 ml   Filed Weights   05/08/24 0500 05/09/24 0500 05/10/24 0403  Weight: 83.2 kg 81.4 kg 86.9 kg    Scheduled Meds:  acetaminophen   1,000 mg Oral Q8H   amLODipine   10 mg Oral Daily   aspirin   81 mg Oral BID   busPIRone   7.5 mg Oral TID   feeding supplement  237 mL Oral BID BM   FLUoxetine   10 mg Oral Daily   LORazepam   0.5 mg Oral Q1200   metoprolol  tartrate  25 mg Oral BID   QUEtiapine   50 mg Oral QHS   Continuous Infusions:  tranexamic acid       Nutritional status Signs/Symptoms: estimated needs Interventions: Ensure Enlive (each supplement provides 350kcal and 20 grams of protein), MVI, Liberalize Diet Body mass index is 28.29 kg/m.  Data Reviewed:   CBC: Recent Labs  Lab 05/04/24 2023 05/05/24 0625 05/05/24 1523 05/06/24 0600 05/07/24 0501 05/07/24 1545 05/08/24 0753 05/09/24 1047  WBC 8.2 11.3*  --  12.7* 13.4*  --  9.4 10.4  NEUTROABS 6.0  --   --   --   --   --   --   --   HGB 13.4 11.5*   < > 8.3* 6.6* 7.6* 7.6* 7.6*  HCT 40.7 35.1*   < > 25.8* 20.7* 22.8* 22.6* 23.8*  MCV 95.3 95.9  --  98.5 99.0  --  94.2 96.4  PLT 224 167  --  124* 94*  --  93* 122*   < > = values in this interval not displayed.   Basic Metabolic Panel: Recent Labs  Lab 05/06/24 0600 05/06/24 1410 05/07/24 0501 05/08/24 0753 05/09/24 0456  NA 134* 134* 136 136 135  K 6.2* 5.8* 5.2* 3.7 5.1  CL 107 105 104 111 108  CO2 16* 13* 19* 18* 16*  GLUCOSE 162* 135* 107* 88 94  BUN 43* 50* 58* 49* 48*  CREATININE 2.35* 2.73* 3.24* 1.92* 1.72*  CALCIUM  8.8* 8.4* 8.8* 7.2* 8.6*  MG  --   --   --  1.7 2.2   GFR: Estimated Creatinine Clearance: 26.6 mL/min (A) (by C-G formula based on SCr of 1.72 mg/dL (H)). Liver Function Tests: No results for input(s): AST, ALT, ALKPHOS, BILITOT, PROT, ALBUMIN  in the last 168 hours. No results for input(s): LIPASE, AMYLASE in the last 168 hours. No results for input(s): AMMONIA in the last  168 hours. Coagulation Profile: No results for input(s): INR, PROTIME in the last 168 hours. Cardiac Enzymes: No results for input(s): CKTOTAL, CKMB, CKMBINDEX, TROPONINI in the last 168 hours. BNP (last 3 results) No results for input(s): PROBNP in the last 8760 hours. HbA1C: No results for input(s): HGBA1C in the last 72 hours. CBG: No results for input(s): GLUCAP in the last 168 hours. Lipid Profile: No results for input(s): CHOL, HDL, LDLCALC, TRIG, CHOLHDL, LDLDIRECT in the last 72 hours. Thyroid  Function Tests: No results for input(s): TSH, T4TOTAL, FREET4, T3FREE, THYROIDAB  in the last 72 hours. Anemia Panel: No results for input(s): VITAMINB12, FOLATE, FERRITIN, TIBC, IRON, RETICCTPCT in the last 72 hours. Sepsis Labs: No results for input(s): PROCALCITON, LATICACIDVEN in the last 168 hours.  Recent Results (from the past 240 hours)  Surgical PCR screen     Status: None   Collection Time: 05/05/24  2:19 AM   Specimen: Nasal Mucosa; Nasal Swab  Result Value Ref Range Status   MRSA, PCR NEGATIVE NEGATIVE Final   Staphylococcus aureus NEGATIVE NEGATIVE Final    Comment: (NOTE) The Xpert SA Assay (FDA approved for NASAL specimens in patients 50 years of age and older), is one component of a comprehensive surveillance program. It is not intended to diagnose infection nor to guide or monitor treatment. Performed at [facility], [address] [place], [place] [address]          Radiology Studies: DG MINI C-ARM IMAGE ONLY Result Date: 05/10/2024 There is no interpretation for this exam.  This order is for images obtained during a surgical procedure.  Please See Surgeries Tab for more information regarding the procedure.           LOS: 6 days   Time spent= 35 mins    Burgess JAYSON Dare, MD [facility]  If 7PM-7AM, please contact night-coverage  05/10/2024, 10:07 AM

## 2024-05-10 NOTE — Interval H&P Note (Signed)
 History and Physical Interval Note:  05/10/2024 7:46 AM  Tiffany Black  has presented today for surgery, with the diagnosis of Right wrist fracture.  The various methods of treatment have been discussed with the patient and family. After consideration of risks, benefits and other options for treatment, the patient has consented to  Procedure(s): OPEN REDUCTION INTERNAL FIXATION (ORIF) WRIST FRACTURE (Right) as a surgical intervention.  The patient's history has been reviewed, patient examined, no change in status, stable for surgery.  I have reviewed the patient's chart and labs.  Questions were answered to the patient's satisfaction.     Orrie Schubert

## 2024-05-10 NOTE — Plan of Care (Signed)
  Problem: Pain Managment: Goal: General experience of comfort will improve and/or be controlled Outcome: Progressing   Problem: Safety: Goal: Ability to remain free from injury will improve Outcome: Progressing   Problem: Skin Integrity: Goal: Risk for impaired skin integrity will decrease Outcome: Progressing

## 2024-05-10 NOTE — Op Note (Signed)
 Date of Surgery: 05/10/2024  INDICATIONS: Patient is a 89 y.o.-year-old female with a closed, right intra-articular fracture of the distal radius and ulna.  Patient has a history of dementia with her son serving as her Clinical research associate.  I reviewed the nature of this injury with him at length including both conservative and surgical treatment options.  Despite her memory issues, patient is independent with nearly all activities of daily living.  As such, patient's son elected for surgical management.  Informed consent was signed after our discussion.   PREOPERATIVE DIAGNOSIS:  Closed, right intra-articular distal radius fracture Closed, right, intra-articular distal ulna fracture  POSTOPERATIVE DIAGNOSIS: Same.  PROCEDURE:   Open reduction and internal fixation of right distal radius fracture, intra-articular, >3 fragments Closed treatment of right distal ulna fracture with manipulation Brachioradialis tenotomy 4 view radiographic examination of the wrist with intra-operative interpretation   SURGEON: Carlin Galla, M.D.  ASSIST:   ANESTHESIA:  general  IV FLUIDS AND URINE: See anesthesia.  ESTIMATED BLOOD LOSS: 10 mL.  IMPLANTS:  Implant Name Type Inv. Item Serial No. Manufacturer Lot No. LRB No. Used Action  PLATE DVR NARROW - [id number] Plate PLATE DVR NARROW  SKELETAL [employer]YNAM[employer]  Right 1 Implanted  SCREW NONLOCK POLYAXIAL 3.5X11 - [id number] Screw SCREW NONLOCK POLYAXIAL 3.5X11  SKELETAL [employer]YNAM[employer]  Right 1 Implanted  PEG SMOOTH LOCK 2.0X18 - [id number] Screw PEG SMOOTH LOCK 2.0X18  SKELETAL [employer]YNAMICS  Right 5 Implanted  PEG GEMINUS SMOOTH LOCK 2.0X17 - [id number] Peg PEG GEMINUS SMOOTH LOCK 2.0X17  SKELETAL DYNAMICS  Right 1 Implanted  SCREW CORT LOCK 3.5X10 TI - [id number] Screw SCREW CORT LOCK 3.5X10 TI  SKELETAL DYNAMICS  Right 1 Implanted  SCREW CORT LOCK 3.5X12 TI - [id number] Screw SCREW CORT LOCK 3.5X12 TI  SKELETAL DYNAMICS  Right 1 Implanted     DRAINS:  None  COMPLICATIONS: None  DESCRIPTION OF PROCEDURE: The patient was met in the preoperative holding area where the surgical site was marked and the consent form was signed.  The patient was then taken to the operating room and transferred to the operating table.  All bony prominences were well padded.  A tourniquet was applied to the right upper extremity.  General endotracheal anesthesia was induced.  The operative extremity was prepped and draped in the usual and sterile fashion.  A formal time-out was performed to confirm that this was the correct patient, surgery, side, and site.    Following formal timeout, a longitudinal incision was made directly overlying the flexor carpi radialis tendon.  The skin was incised.  Small crossing vessels were coagulated with bipolar cautery.  The volar aspect of the flexor carpi radialis tendon sheath was incised longitudinally.  The FCR tendon was retracted radially.  The floor of the FCR tendon sheath was similarly divided using a 15 blade scalpel.  Blunt dissection was used to develop Parona space between the flexor pollicis longus and pronator quadratus.  The flexor pollicis longus was retracted ulnarly.  An L-shaped tenotomy was performed along the distal and radial aspect of the pronator quadratus tendon.  The pronator quadratus was subperiosteally dissected off of the volar aspect of the radius and the distal fracture fragments using an elevator.  Blunt dissection was used to identify the brachioradialis tendon as it inserted on the radial aspect of the distal radius.  The underlying first dorsal compartment tendons were identified and protected.  A tenotomy of the brachioradialis was performed to aid in reduction of the fracture.  Fracture reduction was performed using gentle manipulation with assistance of a freer elevator.  An anatomic reduction was achieved.  Gentle manipulation of the distal ulna was performed to achieve a much more acceptable alignment.  A  3-hole narrow plate was selected and placed on the volar surface of the radius.  This was fixed using the oblong hole with a 3.5 mm bicortical nonlocking screw.  Two K wires were placed in the aiming guides in the radial and ulnar heads of the plate.  AP and lateral views showed that the K wires were in appropriate position and the distal locking screws would be in a good, subchondral location.  The distal holes in the plate were then filled with smooth locking pegs taking care that the pegs did not penetrate the dorsal cortex.  The previously placed K wires were removed and were also replaced with smooth locking pegs.  The two remaining holes in the plate were then filled with bicortical locking screws.  AP, lateral, and 30 degree lateral views showed that the fracture was anatomically reduced with restoration of radial height, inclination, and volar tilt.  The articular surface was congruent on all views.  All of the distal locking screws appear to be in a good, subchondral location and were of the appropriate length.  The wound was then thoroughly irrigated with copious sterile saline.  I did not attempt to repair the pronator quadratus as it was in fairly poor condition.  The skin was then closed using a 4-0 Monocryl suture in a buried interrupted fashion.  The tourniquet was deflated.  Hemostasis was achieved with direct pressure over the wound.  The skin was then closed using a 4-0 Vicryl Rapide suture in horizontal mattress fashion.  The wound was then cleaned and dressed with Xeroform, folded Kerlix, cast padding, and a well-padded sugar-tong splint was applied.  The patient was reversed from anesthesia and extubated uneventfully.  They were transferred from the operating table to the postoperative bed.  All counts were correct x 2 at the end of the procedure.  The patient was then taken to the PACU in stable condition.   POSTOPERATIVE PLAN: She will return to her room on the floor.  I will see her back  in the office in 10 to 14 days for her first postoperative visit.  She will keep the splint clean and dry until that time.  We will get 3 views of the wrist out of the splint at the time of her first office visit.  Carlin Galla, MD 9:10 AM

## 2024-05-10 NOTE — Anesthesia Procedure Notes (Signed)
 Procedure Name: LMA Insertion Date/Time: 05/10/2024 8:01 AM  Performed by: Tressie Gilmore RAMAN, CRNAPre-anesthesia Checklist: Patient identified, Emergency Drugs available, Suction available and Patient being monitored Patient Re-evaluated:Patient Re-evaluated prior to induction Oxygen Delivery Method: Circle System Utilized Preoxygenation: Pre-oxygenation with 100% oxygen Induction Type: IV induction Ventilation: Mask ventilation without difficulty LMA: LMA inserted LMA Size: 4.0 Number of attempts: 1 Airway Equipment and Method: Bite block Placement Confirmation: positive ETCO2 Tube secured with: Tape Dental Injury: Teeth and Oropharynx as per pre-operative assessment

## 2024-05-10 NOTE — Anesthesia Postprocedure Evaluation (Signed)
 Anesthesia Post Note  Patient: Tiffany Black  Procedure(s) Performed: OPEN REDUCTION INTERNAL FIXATION (ORIF) WRIST FRACTURE (Right: Wrist)     Patient location during evaluation: PACU Anesthesia Type: General Level of consciousness: awake and alert Pain management: pain level controlled Vital Signs Assessment: post-procedure vital signs reviewed and stable Respiratory status: spontaneous breathing, nonlabored ventilation, respiratory function stable and patient connected to nasal cannula oxygen Cardiovascular status: blood pressure returned to baseline and stable Postop Assessment: no apparent nausea or vomiting Anesthetic complications: no   There were no known notable events for this encounter.  Last Vitals:  Vitals:   05/10/24 1123 05/10/24 1429  BP: 111/88 131/81  Pulse: (!) 105 (!) 105  Resp: 19 19  Temp: 36.4 C 36.7 C  SpO2: 94% 91%    Last Pain:  Vitals:   05/10/24 1429  TempSrc: Oral  PainSc:                  Ivery Michalski E

## 2024-05-10 NOTE — Anesthesia Preprocedure Evaluation (Addendum)
 Anesthesia Evaluation  Patient identified by MRN, date of birth, ID band Patient awake    Reviewed: Allergy & Precautions, H&P , NPO status , Patient's Chart, lab work & pertinent test results  Airway Mallampati: II   Neck ROM: full    Dental   Pulmonary shortness of breath   breath sounds clear to auscultation       Cardiovascular hypertension, +CHF  + dysrhythmias Atrial Fibrillation  Rhythm:regular Rate:Normal     Neuro/Psych  Headaches PSYCHIATRIC DISORDERS Anxiety    Dementia TIACVA    GI/Hepatic   Endo/Other    Renal/GU Renal InsufficiencyRenal disease     Musculoskeletal  (+) Arthritis ,    Abdominal   Peds  Hematology  (+) Blood dyscrasia, anemia   Anesthesia Other Findings   Reproductive/Obstetrics                              Anesthesia Physical Anesthesia Plan  ASA: 4  Anesthesia Plan: General   Post-op Pain Management: Ofirmev  IV (intra-op)*   Induction: Intravenous  PONV Risk Score and Plan: 3 and Ondansetron , Dexamethasone  and Treatment may vary due to age or medical condition  Airway Management Planned: LMA  Additional Equipment:   Intra-op Plan:   Post-operative Plan: Extubation in OR  Informed Consent: I have reviewed the patients History and Physical, chart, labs and discussed the procedure including the risks, benefits and alternatives for the proposed anesthesia with the patient or authorized representative who has indicated his/her understanding and acceptance.     Dental advisory given  Plan Discussed with: CRNA, Anesthesiologist and Surgeon  Anesthesia Plan Comments:          Anesthesia Quick Evaluation

## 2024-05-11 ENCOUNTER — Other Ambulatory Visit: Payer: Self-pay

## 2024-05-11 DIAGNOSIS — S72001A Fracture of unspecified part of neck of right femur, initial encounter for closed fracture: Secondary | ICD-10-CM | POA: Diagnosis not present

## 2024-05-11 LAB — CBC
HCT: 23.9 % — ABNORMAL LOW (ref 36.0–46.0)
Hemoglobin: 7.6 g/dL — ABNORMAL LOW (ref 12.0–15.0)
MCH: 30.5 pg (ref 26.0–34.0)
MCHC: 31.8 g/dL (ref 30.0–36.0)
MCV: 96 fL (ref 80.0–100.0)
Platelets: 148 K/uL — ABNORMAL LOW (ref 150–400)
RBC: 2.49 MIL/uL — ABNORMAL LOW (ref 3.87–5.11)
RDW: 14 % (ref 11.5–15.5)
WBC: 9.8 K/uL (ref 4.0–10.5)
nRBC: 0 % (ref 0.0–0.2)

## 2024-05-11 LAB — BASIC METABOLIC PANEL WITH GFR
Anion gap: 9 (ref 5–15)
BUN: 38 mg/dL — ABNORMAL HIGH (ref 8–23)
CO2: 19 mmol/L — ABNORMAL LOW (ref 22–32)
Calcium: 9.2 mg/dL (ref 8.9–10.3)
Chloride: 107 mmol/L (ref 98–111)
Creatinine, Ser: 1.44 mg/dL — ABNORMAL HIGH (ref 0.44–1.00)
GFR, Estimated: 35 mL/min — ABNORMAL LOW (ref >60)
Glucose, Bld: 122 mg/dL — ABNORMAL HIGH (ref 70–99)
Potassium: 5.4 mmol/L — ABNORMAL HIGH (ref 3.5–5.1)
Sodium: 135 mmol/L (ref 135–145)

## 2024-05-11 LAB — MAGNESIUM: Magnesium: 2.2 mg/dL (ref 1.7–2.4)

## 2024-05-11 MED ORDER — LORAZEPAM 0.5 MG PO TABS: 0.5000 mg | ORAL_TABLET | Freq: Every day | ORAL | 0 refills | Status: DC

## 2024-05-11 MED ORDER — OLANZAPINE 10 MG IM SOLR: 5.0000 mg | Freq: Four times a day (QID) | INTRAMUSCULAR | Status: DC | PRN

## 2024-05-11 MED ORDER — CHLORHEXIDINE GLUCONATE CLOTH 2 % EX PADS
6.0000 | MEDICATED_PAD | Freq: Every day | CUTANEOUS | Status: DC
  Administered 2024-05-11 – 2024-05-14 (×4): 6 via TOPICAL

## 2024-05-11 MED ORDER — LORAZEPAM 0.5 MG PO TABS: 0.5000 mg | ORAL_TABLET | Freq: Three times a day (TID) | ORAL | 0 refills | Status: DC | PRN

## 2024-05-11 MED ORDER — SODIUM ZIRCONIUM CYCLOSILICATE 10 G PO PACK
10.0000 g | PACK | Freq: Two times a day (BID) | ORAL | Status: AC
  Administered 2024-05-11 (×2): 10 g via ORAL
  Filled 2024-05-11 (×2): qty 1

## 2024-05-11 NOTE — TOC Progression Note (Addendum)
 Transition of Care [facility]) - Progression Note    Patient Details  Name: Tiffany Black MRN: [id number] Date of Birth: 06/26/1934  [facility]) CM/SW Contact  Isaiah Public, LCSWA Phone Number: 05/11/2024, 1:05 PM  Clinical Narrative:     Patient has SNF bed at [facility]. MD informed CSW patient medically ready. CSW started insurance authorization for patient Shara ID# [id number]. CSW spoke with [place] with [facility] who informed CSW to follow up tomorrow on bed availability. CSW will continue to follow.    Expected Discharge Plan: Skilled Nursing Facility Barriers to Discharge: Continued Medical Work up, SNF Pending bed offer               Expected Discharge Plan and Services In-house Referral: Clinical Social Work   Post Acute Care Choice: Skilled Nursing Facility Living arrangements for the past 2 months: Assisted Living Facility Horticulturist, commercial memory care)                                       Social Drivers of Health (SDOH) Interventions SDOH Screenings   Food Insecurity: No Food Insecurity (05/05/2024)  Housing: Low Risk  (05/05/2024)  Transportation Needs: No Transportation Needs (05/05/2024)  Utilities: Not At Risk (05/05/2024)  Social Connections: Unknown (05/05/2024)  Tobacco Use: Low Risk  (05/10/2024)    Readmission Risk Interventions    08/29/2022   10:19 AM  Readmission Risk Prevention Plan  Transportation Screening Complete  Medication Review (RN Care Manager) Complete  PCP or Specialist appointment within 3-5 days of discharge Complete  HRI or Home Care Consult Complete  SW Recovery Care/Counseling Consult Complete  Palliative Care Screening Not Applicable  Skilled Nursing Facility Complete

## 2024-05-11 NOTE — Progress Notes (Signed)
 PT Cancellation Note  Patient Details Name: Tiffany Black MRN: [id number] DOB: Jun 29, 1934   Cancelled Treatment:    Reason Eval/Treat Not Completed: Patient declined, no reason specified, pt declined repeatedly, asking therapist to leave her room. Pt with notable confusion, disoriented, and progressively agitated with offers of mobility, will continue to attempt re-evaluation as time/schedule allows.   Izetta Call, PT, DPT   Acute Rehabilitation Department Office [phone] Secure Chat Communication Preferred   [name] 05/11/2024, 3:56 PM

## 2024-05-11 NOTE — Progress Notes (Addendum)
 Orthopedic Tech Progress Note Patient Details:  Janella Rogala 1933-09-19 [id number]  Plaster sugartong splint reapplied to the RUE as the previous splint was pulled off by the pt. The existing padding was kept on and I added more cotton webril in areas that were sparse. In an effort to deter the pt from removing the splint again, I applied the ace wraps directly to the plaster so it would adhere and also applied tape over the ace wrap per request from Dr. Romona.   Ortho Devices Type of Ortho Device: Sugartong splint Ortho Device/Splint Location: RUE Ortho Device/Splint Interventions: Ordered, Application, Adjustment, Removal (removal of previous splint that was partially pulled off by the pt)   Post Interventions Patient Tolerated: Well Instructions Provided: Care of device  Khandi Kernes Ronal Brasil 05/11/2024, 10:21 AM

## 2024-05-11 NOTE — Progress Notes (Signed)
 PROGRESS NOTE    Tiffany Black  FMW:[id number] DOB: 1934/02/08 DOA: 05/04/2024 PCP: Tiffany Black    Brief Narrative:   [phone] resides at memory care [facility] with Hx of advanced dementia, chronic HFpEF, atrial fibrillation no longer anticoagulated, CKD 3 who presents with right wrist pain and right leg pain after a fall.  Subsequently found to have right intratrochanteric fracture, distal ulnar and radius fracture.  Patient underwent ORIF for the right femur fracture on 8/25.  Hand surgery was consulted who is planning on ORIF 8/30.  Assessment & Plan:  Principal Problem:   Closed right hip fracture, initial encounter [facility]) Active Problems:   Anxiety   Dementia with behavioral disturbance (HCC)   Persistent atrial fibrillation (HCC)   Acute renal failure superimposed on stage 3b chronic kidney disease (HCC)   Closed fracture of right distal radius and ulna   Displaced intertrochanteric fracture of right femur, initial encounter for closed fracture (HCC)    Right intertrochanteric femur fracture s/p ORIF 8/25 - Evaluated by orthopedic surgery.Transition to aspirin  81 mg twice daily postop.  Pain control on board.   Right distal ulnar/radius fracture - Status post ORIF by orthopedic 8/30.  Outpatient follow-up in 10-14 days.  Plan to keep the splint clean and dry.   acute kidney injury on CKD 3B Hyperkalemia - Baseline creatinine 1.3, trending upwards during this admission.  Creatinine 3.24.  With IV fluids renal function has improved.  Renal ultrasound is unremarkable.   Acute anemia - Suspect blood loss.  Baseline hemoglobin 9.0, drifted down 6.6 postoperatively.  Discussed with orthopedic who recommends conservative management and transfusion as needed.    Permanent atrial fibrillation - Patient does not take anticoagulation.  Continue metoprolol .   Chronic HFpEF - Holding diuretics.  Does not appear to be acutely exacerbated.   Dementia/anxiety -  Pleasantly demented, does not remember falling or her injuries.  Will continue home medications once able to tolerate p.o.   Hypertension - Norvasc , metoprolol .  IV as needed     DVT prophylaxis: SCDs Start: 05/05/24 0015    Code Status: Full Code Family Communication:   Status is: Inpatient Remains inpatient appropriate because: Pleasantly confused, ongoing evaluation for her right wrist fracture   PT Follow up Recs: Skilled Nursing-Short Term Rehab (<3 Hours/Day) (Or Return To [place] If Staff Can Give Tot A)05/06/2024 1618  Subjective: Feeling okay, pleasantly confused She has pulled on her dressing in her soft cast.  Orthopedic has been notified  Examination:  General exam: Appears calm and comfortable, elderly frail Respiratory system: Clear to auscultation. Respiratory effort normal. Cardiovascular system: S1 & S2 heard, RRR. No JVD, murmurs, rubs, gallops or clicks. No pedal edema. Gastrointestinal system: Abdomen is nondistended, soft and nontender. No organomegaly or masses felt. Normal bowel sounds heard. Central nervous system: Alert and oriented to name only, baseline. No focal neurological deficits. Extremities: Symmetric 4 x 5 power. Skin: No rashes, lesions or ulcers Psychiatry: Judgement and insight appear poor                Diet Orders (From admission, onward)     Start     Ordered   05/10/24 1008  Diet Heart Room service appropriate? Yes; Fluid consistency: Thin  Diet effective now       Question Answer Comment  Room service appropriate? Yes   Fluid consistency: Thin      05/10/24 1007            Objective: Vitals:  05/10/24 1429 05/10/24 2040 05/10/24 2345 05/11/24 0500  BP: 131/81 118/70 (!) 122/101 110/77  Pulse: (!) 105 (!) 104 98 92  Resp: 19 18 20 18   Temp: 98.1 F (36.7 C) 98.4 F (36.9 C) 98.4 F (36.9 C) 97.7 F (36.5 C)  TempSrc: Oral Oral Oral   SpO2: 91% 96% 97% 100%  Weight:    85.9 kg  Height:         Intake/Output Summary (Last 24 hours) at 05/11/2024 0954 Last data filed at 05/10/2024 2100 Gross per 24 hour  Intake 220 ml  Output 1150 ml  Net -930 ml   Filed Weights   05/09/24 0500 05/10/24 0403 05/11/24 0500  Weight: 81.4 kg 86.9 kg 85.9 kg    Scheduled Meds:  acetaminophen   1,000 mg Oral Q8H   amLODipine   10 mg Oral Daily   aspirin   81 mg Oral BID   busPIRone   7.5 mg Oral TID   Chlorhexidine  Gluconate Cloth  6 each Topical Daily   feeding supplement  237 mL Oral BID BM   FLUoxetine   10 mg Oral Daily   LORazepam   0.5 mg Oral Q1200   metoprolol  tartrate  25 mg Oral BID   QUEtiapine   50 mg Oral QHS   sodium zirconium cyclosilicate   10 g Oral BID   Continuous Infusions:  tranexamic acid       Nutritional status Signs/Symptoms: estimated needs Interventions: Ensure Enlive (each supplement provides 350kcal and 20 grams of protein), MVI, Liberalize Diet Body mass index is 27.97 kg/m.  Data Reviewed:   CBC: Recent Labs  Lab 05/04/24 2023 05/05/24 0625 05/07/24 0501 05/07/24 1545 05/08/24 0753 05/09/24 1047 05/10/24 0739 05/10/24 1555 05/11/24 0357  WBC 8.2   < > 13.4*  --  9.4 10.4  --  8.0 9.8  NEUTROABS 6.0  --   --   --   --   --   --   --   --   HGB 13.4   < > 6.6*   < > 7.6* 7.6* 7.1* 7.3* 7.6*  HCT 40.7   < > 20.7*   < > 22.6* 23.8* 21.0* 23.1* 23.9*  MCV 95.3   < > 99.0  --  94.2 96.4  --  97.1 96.0  PLT 224   < > 94*  --  93* 122*  --  122* 148*   < > = values in this interval not displayed.   Basic Metabolic Panel: Recent Labs  Lab 05/07/24 0501 05/08/24 0753 05/09/24 0456 05/10/24 0739 05/10/24 1555 05/11/24 0357  NA 136 136 135 137 134* 135  K 5.2* 3.7 5.1 4.6 5.7* 5.4*  CL 104 111 108 108 108 107  CO2 19* 18* 16*  --  19* 19*  GLUCOSE 107* 88 94 91 155* 122*  BUN 58* 49* 48* 34* 35* 38*  CREATININE 3.24* 1.92* 1.72* 1.30* 1.31* 1.44*  CALCIUM  8.8* 7.2* 8.6*  --  8.7* 9.2  MG  --  1.7 2.2  --  2.0 2.2   GFR: Estimated  Creatinine Clearance: 31.6 mL/min (A) (by C-G formula based on SCr of 1.44 mg/dL (H)). Liver Function Tests: No results for input(s): AST, ALT, ALKPHOS, BILITOT, PROT, ALBUMIN  in the last 168 hours. No results for input(s): LIPASE, AMYLASE in the last 168 hours. No results for input(s): AMMONIA in the last 168 hours. Coagulation Profile: No results for input(s): INR, PROTIME in the last 168 hours. Cardiac Enzymes: No results for input(s): CKTOTAL, CKMB, CKMBINDEX, TROPONINI  in the last 168 hours. BNP (last 3 results) No results for input(s): PROBNP in the last 8760 hours. HbA1C: No results for input(s): HGBA1C in the last 72 hours. CBG: No results for input(s): GLUCAP in the last 168 hours. Lipid Profile: No results for input(s): CHOL, HDL, LDLCALC, TRIG, CHOLHDL, LDLDIRECT in the last 72 hours. Thyroid  Function Tests: No results for input(s): TSH, T4TOTAL, FREET4, T3FREE, THYROIDAB in the last 72 hours. Anemia Panel: No results for input(s): VITAMINB12, FOLATE, FERRITIN, TIBC, IRON, RETICCTPCT in the last 72 hours. Sepsis Labs: No results for input(s): PROCALCITON, LATICACIDVEN in the last 168 hours.  Recent Results (from the past 240 hours)  Surgical PCR screen     Status: None   Collection Time: 05/05/24  2:19 AM   Specimen: Nasal Mucosa; Nasal Swab  Result Value Ref Range Status   MRSA, PCR NEGATIVE NEGATIVE Final   Staphylococcus aureus NEGATIVE NEGATIVE Final    Comment: (NOTE) The Xpert SA Assay (FDA approved for NASAL specimens in patients 81 years of age and older), is one component of a comprehensive surveillance program. It is not intended to diagnose infection nor to guide or monitor treatment. Performed at [facility], [address], [place], [place] [address]          Radiology Studies: DG MINI C-ARM IMAGE ONLY Result Date: 05/10/2024 There is no interpretation for this exam.   This order is for images obtained during a surgical procedure.  Please See Surgeries Tab for more information regarding the procedure.           LOS: 7 days   Time spent= 35 mins    Burgess JAYSON Dare, Black [facility]  If 7PM-7AM, please contact night-coverage  05/11/2024, 9:54 AM

## 2024-05-11 NOTE — Plan of Care (Signed)
  Problem: Clinical Measurements: Goal: Ability to maintain clinical measurements within normal limits will improve Outcome: Progressing   Problem: Nutrition: Goal: Adequate nutrition will be maintained Outcome: Progressing   Problem: Pain Managment: Goal: General experience of comfort will improve and/or be controlled Outcome: Progressing   Problem: Safety: Goal: Ability to remain free from injury will improve Outcome: Progressing   Problem: Skin Integrity: Goal: Risk for impaired skin integrity will decrease Outcome: Progressing

## 2024-05-11 NOTE — Progress Notes (Signed)
 Patient seen and examined this morning.  She is resting comfortably in bed.  She is somewhat confused about the surgery and her reason for hospitalization.  She denies any pain in the right upper extremity.  She did remove her fiberglass splint earlier today but the underlying cast padding and sterile bandage was still intact.  A new sugar-tong splint was applied in such a way that she will hopefully not be able to remove it.  Her new sugar-tong splint is clean and dry.  She seems to have near full active range of motion of her fingers but is mildly limited by pain.  Sensation is intact to light touch in the fingertips.  All fingers are pink and well-perfused.  Patient is postop day 1 status post open reduction into fixation of a right, intra-articular distal radius fracture with closed treatment of an associated ulnar neck fracture.  I updated her son yesterday after surgery.  She is doing well today.  She did remove her splint which is certainly a risk in patients with dementia.  A new splint was applied in such a way that she will hopefully not be able to remove it.  She will be nonweightbearing to the right upper extremity.  She will maintain the splint until she sees me in the office in 10 to 14 days.  Bebe Galla, M.D. [facility]

## 2024-05-12 DIAGNOSIS — S72001A Fracture of unspecified part of neck of right femur, initial encounter for closed fracture: Secondary | ICD-10-CM | POA: Diagnosis not present

## 2024-05-12 LAB — CBC
HCT: 24.7 % — ABNORMAL LOW (ref 36.0–46.0)
Hemoglobin: 8 g/dL — ABNORMAL LOW (ref 12.0–15.0)
MCH: 31.3 pg (ref 26.0–34.0)
MCHC: 32.4 g/dL (ref 30.0–36.0)
MCV: 96.5 fL (ref 80.0–100.0)
Platelets: 185 K/uL (ref 150–400)
RBC: 2.56 MIL/uL — ABNORMAL LOW (ref 3.87–5.11)
RDW: 15.1 % (ref 11.5–15.5)
WBC: 11.2 K/uL — ABNORMAL HIGH (ref 4.0–10.5)
nRBC: 0.3 % — ABNORMAL HIGH (ref 0.0–0.2)

## 2024-05-12 LAB — BASIC METABOLIC PANEL WITH GFR
Anion gap: 12 (ref 5–15)
BUN: 41 mg/dL — ABNORMAL HIGH (ref 8–23)
CO2: 17 mmol/L — ABNORMAL LOW (ref 22–32)
Calcium: 8.9 mg/dL (ref 8.9–10.3)
Chloride: 106 mmol/L (ref 98–111)
Creatinine, Ser: 1.32 mg/dL — ABNORMAL HIGH (ref 0.44–1.00)
GFR, Estimated: 39 mL/min — ABNORMAL LOW (ref >60)
Glucose, Bld: 89 mg/dL (ref 70–99)
Potassium: 4.6 mmol/L (ref 3.5–5.1)
Sodium: 135 mmol/L (ref 135–145)

## 2024-05-12 LAB — MAGNESIUM: Magnesium: 2 mg/dL (ref 1.7–2.4)

## 2024-05-12 MED ORDER — SODIUM CHLORIDE 0.9% FLUSH: 10.0000 mL | INTRAVENOUS | Status: DC | PRN

## 2024-05-12 MED ORDER — SODIUM ZIRCONIUM CYCLOSILICATE 10 G PO PACK
10.0000 g | PACK | Freq: Two times a day (BID) | ORAL | Status: AC
  Administered 2024-05-12 (×2): 10 g via ORAL
  Filled 2024-05-12 (×2): qty 1

## 2024-05-12 MED ORDER — PNEUMOCOCCAL 20-VAL CONJ VACC 0.5 ML IM SUSY
0.5000 mL | PREFILLED_SYRINGE | INTRAMUSCULAR | Status: DC
  Filled 2024-05-12: qty 0.5

## 2024-05-12 MED ORDER — SODIUM CHLORIDE 0.9 % IV BOLUS
500.0000 mL | Freq: Once | INTRAVENOUS | Status: AC
  Administered 2024-05-12: 500 mL via INTRAVENOUS

## 2024-05-12 NOTE — Progress Notes (Signed)

## 2024-05-12 NOTE — Plan of Care (Signed)
  Problem: Education: Goal: Knowledge of General Education information will improve Description: Including pain rating scale, medication(s)/side effects and non-pharmacologic comfort measures Outcome: Progressing   Problem: Health Behavior/Discharge Planning: Goal: Ability to manage health-related needs will improve Outcome: Progressing   Problem: Clinical Measurements: Goal: Will remain free from infection Outcome: Progressing Goal: Diagnostic test results will improve Outcome: Progressing Goal: Respiratory complications will improve Outcome: Progressing Goal: Cardiovascular complication will be avoided Outcome: Progressing   Problem: Activity: Goal: Risk for activity intolerance will decrease Outcome: Progressing   Problem: Nutrition: Goal: Adequate nutrition will be maintained Outcome: Progressing   Problem: Elimination: Goal: Will not experience complications related to bowel motility Outcome: Progressing Goal: Will not experience complications related to urinary retention Outcome: Progressing   Problem: Pain Managment: Goal: General experience of comfort will improve and/or be controlled Outcome: Progressing   Problem: Safety: Goal: Ability to remain free from injury will improve Outcome: Progressing   Problem: Skin Integrity: Goal: Risk for impaired skin integrity will decrease Outcome: Progressing

## 2024-05-12 NOTE — TOC Progression Note (Addendum)
 Transition of Care [facility]) - Progression Note    Patient Details  Name: Tiffany Black MRN: [id number] Date of Birth: 1934/08/11  [facility]) CM/SW Contact  Bridget Cordella Simmonds, LCSW Phone Number: 05/12/2024, 1:11 PM  Clinical Narrative:    SNF auth request still pending.  1310: SNF auth approved: [id number], 3 days: 9/1-9/3.  CSW reached out to [facility]: they cannot receive pt today, can take tomorrow.   Expected Discharge Plan: Skilled Nursing Facility Barriers to Discharge: Continued Medical Work up, SNF Pending bed offer               Expected Discharge Plan and Services In-house Referral: Clinical Social Work   Post Acute Care Choice: Skilled Nursing Facility Living arrangements for the past 2 months: Assisted Living Facility [facility], commercial memory care)                                       Social Drivers of Health (SDOH) Interventions SDOH Screenings   Food Insecurity: No Food Insecurity (05/05/2024)  Housing: Low Risk  (05/05/2024)  Transportation Needs: No Transportation Needs (05/05/2024)  Utilities: Not At Risk (05/05/2024)  Social Connections: Unknown (05/05/2024)  Tobacco Use: Low Risk  (05/10/2024)    Readmission Risk Interventions    08/29/2022   10:19 AM  Readmission Risk Prevention Plan  Transportation Screening Complete  Medication Review (RN Care Manager) Complete  PCP or Specialist appointment within 3-5 days of discharge Complete  HRI or Home Care Consult Complete  SW Recovery Care/Counseling Consult Complete  Palliative Care Screening Not Applicable  Skilled Nursing Facility Complete

## 2024-05-12 NOTE — Progress Notes (Signed)
 PROGRESS NOTE    Tiffany Black  FMW:[id number] DOB: 10/31/1933 DOA: 05/04/2024 PCP: [name], MD    Brief Narrative:   [phone] resides at memory care [facility] with Hx of advanced dementia, chronic HFpEF, atrial fibrillation no longer anticoagulated, CKD 3 who presents with right wrist pain and right leg pain after a fall.  Subsequently found to have right intratrochanteric fracture, distal ulnar and radius fracture.  Patient underwent ORIF for the right femur fracture on 8/25.  Hand surgery and patient underwent ORIF for her right hand on 8/30.  During the hospitalization due to acute blood loss anemia required PRBC transfusion.  Hospital course also complicated by AKI resolving with IV fluids.  Currently awaiting SNF placement.  Assessment & Plan:  Principal Problem:   Closed right hip fracture, initial encounter [facility]) Active Problems:   Anxiety   Dementia with behavioral disturbance (HCC)   Persistent atrial fibrillation (HCC)   Acute renal failure superimposed on stage 3b chronic kidney disease (HCC)   Closed fracture of right distal radius and ulna   Displaced intertrochanteric fracture of right femur, initial encounter for closed fracture (HCC)    Right intertrochanteric femur fracture s/p ORIF 8/25 - Evaluated by orthopedic surgery.Transition to aspirin  81 mg twice daily postop.  Pain control on board.   Right distal ulnar/radius fracture - Status post ORIF by orthopedic 8/30.  Outpatient follow-up in 10-14 days.  Plan to keep the splint clean and dry.   acute kidney injury on CKD 3B Hyperkalemia - Baseline creatinine 1.3, trending upwards during this admission.  Creatinine 3.24.  Renal ultrasound unremarkable.  Creatinine stable around 1.44.   Acute anemia - Suspect blood loss.  Baseline hemoglobin 9.0, drifted down 6.6 postoperatively.  Discussed with orthopedic who recommends conservative management and transfusion as needed.  -Status post 1 unit PRBC  8/27.   Permanent atrial fibrillation - Patient does not take anticoagulation.  Continue metoprolol .   Chronic HFpEF - Holding diuretics.  Does not appear to be acutely exacerbated.   Dementia/anxiety - Pleasantly demented, does not remember falling or her injuries.  Will continue home medications once able to tolerate p.o.   Hypertension - Norvasc , metoprolol .  IV as needed     DVT prophylaxis: SCDs Start: 05/05/24 0015    Code Status: Full Code Family Communication:   Status is: Inpatient Remains inpatient appropriate because: Any SNF placement   PT Follow up Recs: Skilled Nursing-Short Term Rehab (<3 Hours/Day) (Or Return To [place] If Staff Can Give Tot A)05/06/2024 1618  Subjective: Pleasantly confused no complaints  Examination:  General exam: Appears calm and comfortable, elderly frail Respiratory system: Clear to auscultation. Respiratory effort normal. Cardiovascular system: S1 & S2 heard, RRR. No JVD, murmurs, rubs, gallops or clicks. No pedal edema. Gastrointestinal system: Abdomen is nondistended, soft and nontender. No organomegaly or masses felt. Normal bowel sounds heard. Central nervous system: Alert and oriented to name only, baseline. No focal neurological deficits. Extremities: Symmetric 4 x 5 power. Skin: No rashes, lesions or ulcers Psychiatry: Judgement and insight appear poor                Diet Orders (From admission, onward)     Start     Ordered   05/10/24 1008  Diet Heart Room service appropriate? Yes; Fluid consistency: Thin  Diet effective now       Question Answer Comment  Room service appropriate? Yes   Fluid consistency: Thin      05/10/24 1007  Objective: Vitals:   05/12/24 0411 05/12/24 0413 05/12/24 0500 05/12/24 0835  BP: 108/80   113/76  Pulse: 70   69  Resp:  18  16  Temp: 98.2 F (36.8 C)   97.8 F (36.6 C)  TempSrc: Oral   Oral  SpO2: 98%   92%  Weight:   84.7 kg   Height:         Intake/Output Summary (Last 24 hours) at 05/12/2024 1121 Last data filed at 05/12/2024 9388 Gross per 24 hour  Intake 720 ml  Output 800 ml  Net -80 ml   Filed Weights   05/10/24 0403 05/11/24 0500 05/12/24 0500  Weight: 86.9 kg 85.9 kg 84.7 kg    Scheduled Meds:  acetaminophen   1,000 mg Oral Q8H   amLODipine   10 mg Oral Daily   aspirin   81 mg Oral BID   busPIRone   7.5 mg Oral TID   Chlorhexidine  Gluconate Cloth  6 each Topical Daily   feeding supplement  237 mL Oral BID BM   FLUoxetine   10 mg Oral Daily   LORazepam   0.5 mg Oral Q1200   metoprolol  tartrate  25 mg Oral BID   pneumococcal 20-valent conjugate vaccine  0.5 mL Intramuscular Tomorrow-1000   QUEtiapine   50 mg Oral QHS   sodium zirconium cyclosilicate   10 g Oral BID   Continuous Infusions:  sodium chloride      tranexamic acid       Nutritional status Signs/Symptoms: estimated needs Interventions: Ensure Enlive (each supplement provides 350kcal and 20 grams of protein), MVI, Liberalize Diet Body mass index is 27.58 kg/m.  Data Reviewed:   CBC: Recent Labs  Lab 05/08/24 0753 05/09/24 1047 05/10/24 0739 05/10/24 1555 05/11/24 0357 05/12/24 1018  WBC 9.4 10.4  --  8.0 9.8 11.2*  HGB 7.6* 7.6* 7.1* 7.3* 7.6* 8.0*  HCT 22.6* 23.8* 21.0* 23.1* 23.9* 24.7*  MCV 94.2 96.4  --  97.1 96.0 96.5  PLT 93* 122*  --  122* 148* 185   Basic Metabolic Panel: Recent Labs  Lab 05/08/24 0753 05/09/24 0456 05/10/24 0739 05/10/24 1555 05/11/24 0357 05/12/24 1018  NA 136 135 137 134* 135 135  K 3.7 5.1 4.6 5.7* 5.4* 4.6  CL 111 108 108 108 107 106  CO2 18* 16*  --  19* 19* 17*  GLUCOSE 88 94 91 155* 122* 89  BUN 49* 48* 34* 35* 38* 41*  CREATININE 1.92* 1.72* 1.30* 1.31* 1.44* 1.32*  CALCIUM  7.2* 8.6*  --  8.7* 9.2 8.9  MG 1.7 2.2  --  2.0 2.2 2.0   GFR: Estimated Creatinine Clearance: 34.2 mL/min (A) (by C-G formula based on SCr of 1.32 mg/dL (H)). Liver Function Tests: No results for input(s): AST,  ALT, ALKPHOS, BILITOT, PROT, ALBUMIN  in the last 168 hours. No results for input(s): LIPASE, AMYLASE in the last 168 hours. No results for input(s): AMMONIA in the last 168 hours. Coagulation Profile: No results for input(s): INR, PROTIME in the last 168 hours. Cardiac Enzymes: No results for input(s): CKTOTAL, CKMB, CKMBINDEX, TROPONINI in the last 168 hours. BNP (last 3 results) No results for input(s): PROBNP in the last 8760 hours. HbA1C: No results for input(s): HGBA1C in the last 72 hours. CBG: No results for input(s): GLUCAP in the last 168 hours. Lipid Profile: No results for input(s): CHOL, HDL, LDLCALC, TRIG, CHOLHDL, LDLDIRECT in the last 72 hours. Thyroid  Function Tests: No results for input(s): TSH, T4TOTAL, FREET4, T3FREE, THYROIDAB in the last 72 hours. Anemia  Panel: No results for input(s): VITAMINB12, FOLATE, FERRITIN, TIBC, IRON, RETICCTPCT in the last 72 hours. Sepsis Labs: No results for input(s): PROCALCITON, LATICACIDVEN in the last 168 hours.  Recent Results (from the past 240 hours)  Surgical PCR screen     Status: None   Collection Time: 05/05/24  2:19 AM   Specimen: Nasal Mucosa; Nasal Swab  Result Value Ref Range Status   MRSA, PCR NEGATIVE NEGATIVE Final   Staphylococcus aureus NEGATIVE NEGATIVE Final    Comment: (NOTE) The Xpert SA Assay (FDA approved for NASAL specimens in patients 73 years of age and older), is one component of a comprehensive surveillance program. It is not intended to diagnose infection nor to guide or monitor treatment. Performed at [facility], [address] [place], [place] [address]          Radiology Studies: No results found.         LOS: 8 days   Time spent= 35 mins    Burgess JAYSON Dare, MD [facility]  If 7PM-7AM, please contact night-coverage  05/12/2024, 11:21 AM

## 2024-05-12 NOTE — Progress Notes (Signed)
 PT Cancellation Note  Patient Details Name: Tiffany Black MRN: [id number] DOB: August 16, 1934   Cancelled Treatment:    Reason Eval/Treat Not Completed: Other (comment) per RN, pt still deeply asleep. Had been agitated earlier and had to be medicated. Will check back in and return if she is appropriate for PT later in the morning.   Josette Rough, PT, DPT 05/12/24 9:23 AM

## 2024-05-13 ENCOUNTER — Encounter (HOSPITAL_COMMUNITY): Payer: Self-pay | Admitting: Orthopedic Surgery

## 2024-05-13 DIAGNOSIS — S72001A Fracture of unspecified part of neck of right femur, initial encounter for closed fracture: Secondary | ICD-10-CM | POA: Diagnosis not present

## 2024-05-13 LAB — HEMOGLOBIN AND HEMATOCRIT, BLOOD
HCT: 25.1 % — ABNORMAL LOW (ref 36.0–46.0)
Hemoglobin: 8.3 g/dL — ABNORMAL LOW (ref 12.0–15.0)

## 2024-05-13 LAB — CBC
HCT: 21.4 % — ABNORMAL LOW (ref 36.0–46.0)
Hemoglobin: 6.9 g/dL — CL (ref 12.0–15.0)
MCH: 31.8 pg (ref 26.0–34.0)
MCHC: 32.2 g/dL (ref 30.0–36.0)
MCV: 98.6 fL (ref 80.0–100.0)
Platelets: 177 K/uL (ref 150–400)
RBC: 2.17 MIL/uL — ABNORMAL LOW (ref 3.87–5.11)
RDW: 15.2 % (ref 11.5–15.5)
WBC: 9 K/uL (ref 4.0–10.5)
nRBC: 0.2 % (ref 0.0–0.2)

## 2024-05-13 LAB — BASIC METABOLIC PANEL WITH GFR
Anion gap: 7 (ref 5–15)
BUN: 33 mg/dL — ABNORMAL HIGH (ref 8–23)
CO2: 21 mmol/L — ABNORMAL LOW (ref 22–32)
Calcium: 8.4 mg/dL — ABNORMAL LOW (ref 8.9–10.3)
Chloride: 105 mmol/L (ref 98–111)
Creatinine, Ser: 1.2 mg/dL — ABNORMAL HIGH (ref 0.44–1.00)
GFR, Estimated: 44 mL/min — ABNORMAL LOW (ref >60)
Glucose, Bld: 86 mg/dL (ref 70–99)
Potassium: 4 mmol/L (ref 3.5–5.1)
Sodium: 133 mmol/L — ABNORMAL LOW (ref 135–145)

## 2024-05-13 LAB — MAGNESIUM: Magnesium: 1.7 mg/dL (ref 1.7–2.4)

## 2024-05-13 LAB — PREPARE RBC (CROSSMATCH)

## 2024-05-13 MED ORDER — MAGNESIUM CITRATE PO SOLN
1.0000 | Freq: Once | ORAL | Status: AC
  Administered 2024-05-13: 1 via ORAL
  Filled 2024-05-13: qty 296

## 2024-05-13 MED ORDER — SODIUM CHLORIDE 0.9% IV SOLUTION: Freq: Once | INTRAVENOUS | Status: AC

## 2024-05-13 NOTE — Progress Notes (Signed)
 Occupational Therapy Treatment Patient Details Name: Tiffany Black MRN: [id number] DOB: 1933-10-26 Today's Date: 05/13/2024   History of present illness Pt is an 89 y/o F presenting [facility] ED [facility] from [facility] after fall, found to have R intertrochanteric femur fx and R distal ulnar/radius fx. S/p R femur ORIF, R distal radius fx managed in splint.S/p R radial ORIF on 8/30. PMH includes CVA, HTN, dementia, anxiety, chronic HFpEF, A fib, CKD III   OT comments  Pt progressing toward goals, more alert this session, but limited by pain/fear of falling. Pt able to take R shoulder through AAR[facility], can wiggle digits on RUE. Pt needing max A +2 for bed mobility and standing attempts with Stedy. Pt with posterior lean sitting EOB, needs min A for seated grooming task. Pt presenting with impairments listed below, will follow acutely. Patient will benefit from continued inpatient follow up therapy, <3 hours/day to maximize safety/ind with ADL/functional mobility.       If plan is discharge home, recommend the following:  Two people to help with walking and/or transfers;Two people to help with bathing/dressing/bathroom;Assistance with cooking/housework;Direct supervision/assist for medications management;Direct supervision/assist for financial management;Help with stairs or ramp for entrance;Supervision due to cognitive status;Assist for transportation   Equipment Recommendations  Other (comment) (defer)    Recommendations for Other Services PT consult    Precautions / Restrictions Precautions Precautions: Fall Recall of Precautions/Restrictions: Impaired Precaution/Restrictions Comments: LUE mitt Restrictions Weight Bearing Restrictions Per Provider Order: Yes RUE Weight Bearing Per Provider Order: Non weight bearing RLE Weight Bearing Per Provider Order: Weight bearing as tolerated Other Position/Activity Restrictions: splint/ace wrap RUE       Mobility Bed Mobility Overal bed mobility: Needs  Assistance Bed Mobility: Supine to Sit, Sit to Supine, Rolling Rolling: Total assist   Supine to sit: Max assist, +2 for physical assistance Sit to supine: Max assist, +2 for physical assistance        Transfers Overall transfer level: Needs assistance Equipment used: Ambulation equipment used Transfers: Sit to/from Stand Sit to Stand: Max assist, +2 physical assistance, Via Scientist, product/process development via Lift Equipment: Stedy   Balance Overall balance assessment: Needs assistance Sitting-balance support: Feet supported, Single extremity supported Sitting balance-Leahy Scale: Poor Sitting balance - Comments: Pt leans posteriorly and laterally to the L the longer she sits EOB, initially only needing CGA-minA then progressing to intermittent modA as her directional lean intensity increased. Pt able to sustain her balance better when holding onto bed rail with L UE. Pt sat EOB x15 min Postural control: Posterior lean Standing balance support: Single extremity supported, During functional activity Standing balance-Leahy Scale: Poor Standing balance comment: L UE and bil knee block with maxA to come to a half stand                           ADL either performed or assessed with clinical judgement   ADL Overall ADL's : Needs assistance/impaired Eating/Feeding: Moderate assistance;Sitting   Grooming: Brushing hair;Minimal assistance;Sitting                   Toilet Transfer: Maximal assistance;+2 for physical assistance           Functional mobility during ADLs: Maximal assistance;+2 for physical assistance      Extremity/Trunk Assessment Upper Extremity Assessment Upper Extremity Assessment: RUE deficits/detail RUE Deficits / Details: RUE immobilized in splint, moving LUE  >  RUE, decr response to touch/pain to RUE RUE Sensation: decreased light touch;decreased proprioception RUE Coordination: decreased fine motor;decreased gross motor    Lower Extremity Assessment Lower Extremity Assessment: Defer to PT evaluation        Vision   Additional Comments: [facility] for BADL   Perception Perception Perception: Not tested   Praxis Praxis Praxis: Not tested   Communication Communication Communication: No apparent difficulties   Cognition Arousal: Alert Behavior During Therapy: Flat affect Cognition: History of cognitive impairments             OT - Cognition Comments: Baseline dementia.  Patient not able to comprehend she is in the hospital.  Alert to person only.  Poor memory.                 Following commands: Impaired Following commands impaired: Follows one step commands inconsistently, Follows one step commands with increased time      Cueing   Cueing Techniques: Verbal cues, Gestural cues, Tactile cues, Visual cues  Exercises      Shoulder Instructions       General Comments VSS on RA    Pertinent Vitals/ Pain       Pain Assessment Pain Assessment: Faces Pain Score: 5  Faces Pain Scale: Hurts even more Pain Location: R leg and R UE Pain Descriptors / Indicators: Discomfort, Grimacing, Guarding, Moaning Pain Intervention(s): Limited activity within patient's tolerance, Monitored during session, Repositioned  Home Living                                          Prior Functioning/Environment              Frequency  Min 2X/week        Progress Toward Goals  OT Goals(current goals can now be found in the care plan section)  Progress towards OT goals: Progressing toward goals  Acute Rehab OT Goals Patient Stated Goal: none stated OT Goal Formulation: With patient Time For Goal Achievement: 05/20/24 Potential to Achieve Goals: Fair ADL Goals Pt Will Perform Upper Body Dressing: sitting;with contact guard assist Pt Will Perform Lower Body Dressing: sitting/lateral leans;sit to/from stand;with mod assist Pt Will Transfer to Toilet: stand pivot  transfer;bedside commode;with mod assist  Plan      Co-evaluation    PT/OT/SLP Co-Evaluation/Treatment: Yes Reason for Co-Treatment: Complexity of the patient's impairments (multi-system involvement);Necessary to address cognition/behavior during functional activity;To address functional/ADL transfers PT goals addressed during session: Mobility/safety with mobility OT goals addressed during session: ADL's and self-care;Strengthening/ROM      AM-PAC OT 6 Clicks Daily Activity     Outcome Measure   Help from another person eating meals?: A Lot Help from another person taking care of personal grooming?: A Lot Help from another person toileting, which includes using toliet, bedpan, or urinal?: Total Help from another person bathing (including washing, rinsing, drying)?: A Lot Help from another person to put on and taking off regular upper body clothing?: A Lot Help from another person to put on and taking off regular lower body clothing?: Total 6 Click Score: 10    End of Session Equipment Utilized During Treatment: Gait belt  OT Visit Diagnosis: Unsteadiness on feet (R26.81);Other abnormalities of gait and mobility (R26.89);Muscle weakness (generalized) (M62.81);History of falling (Z91.81)   Activity Tolerance Patient limited by pain   Patient Left in bed;with call bell/phone within reach;with bed alarm  set   Nurse Communication Mobility status        Time: 0940-1006 OT Time Calculation (min): 26 min  Charges: OT General Charges $OT Visit: 1 Visit OT Treatments $Self Care/Home Management : 8-22 mins  Bentleigh Stankus K, OTD, OTR/L SecureChat Preferred Acute Rehab [phone]   Laneta POUR Koonce 05/13/2024, 10:17 AM

## 2024-05-13 NOTE — Discharge Summary (Signed)
 Physician Discharge Summary  Tiffany Black FMW:[id number] DOB: 02-Sep-1934 DOA: 05/04/2024  PCP: [name], MD  Admit date: 05/04/2024 Discharge date: 05/13/2024  Admitted From: Home Disposition: SNF  Recommendations for Outpatient Follow-up:  Follow up with PCP in 1-2 weeks Please obtain BMP/CBC in one week your next doctors visit.  Aspirin  twice daily for DVT prophylaxis.  Outpatient follow-up with orthopedic for her right hip and her right wrist fractures.  This is to be arranged by their service   Discharge Condition: Stable CODE STATUS: Full code Diet recommendation: Heart healthy  Brief/Interim Summary: Brief Narrative:   12F resides at memory care [facility] with Hx of advanced dementia, chronic HFpEF, atrial fibrillation no longer anticoagulated, CKD 3 who presents with right wrist pain and right leg pain after a fall.  Subsequently found to have right intratrochanteric fracture, distal ulnar and radius fracture.  Patient underwent ORIF for the right femur fracture on 8/25.  Hand surgery and patient underwent ORIF for her right hand on 8/30.  During the hospitalization due to acute blood loss anemia required PRBC transfusion.  Hospital course also complicated by AKI resolving with IV fluids.  SNF placement once hemoglobin is stable.  Will repeat H&H after 1 unit PRBC transfusion.  No obvious signs of blood loss.  Assessment & Plan:  Principal Problem:   Closed right hip fracture, initial encounter [facility]) Active Problems:   Anxiety   Dementia with behavioral disturbance (HCC)   Persistent atrial fibrillation (HCC)   Acute renal failure superimposed on stage 3b chronic kidney disease (HCC)   Closed fracture of right distal radius and ulna   Displaced intertrochanteric fracture of right femur, initial encounter for closed fracture (HCC)    Right intertrochanteric femur fracture s/p ORIF 8/25 - Evaluated by orthopedic surgery.Transition to aspirin  81 mg twice  daily postop.  Pain control on board.   Right distal ulnar/radius fracture - Status post ORIF by orthopedic 8/30.  Outpatient follow-up in 10-14 days.  Plan to keep the splint clean and dry.   acute kidney injury on CKD 3B Hyperkalemia - Baseline creatinine 1.3, trending upwards during this admission.  Creatinine 3.24.  Renal ultrasound unremarkable.  Creatinine stable around 1.44.   Acute anemia - Baseline hemoglobin 9.0, postop anemia after both of her surgeries. -Status post 1 unit PRBC 8/27, 9/2   Permanent atrial fibrillation - Patient does not take anticoagulation.  Continue metoprolol .   Chronic HFpEF - Holding diuretics.  Does not appear to be acutely exacerbated.   Dementia/anxiety - Pleasantly demented, does not remember falling or her injuries.  Will continue home medications once able to tolerate p.o.   Hypertension - Norvasc , metoprolol .  IV as needed     DVT prophylaxis: SCDs Start: 05/05/24 0015    Code Status: Full Code Family Communication:   Status is: Inpatient Remains inpatient appropriate because: Will plan for SNF placement tomorrow after PRBC transfusion today and repeat blood draw   PT Follow up Recs: Skilled Nursing-Short Term Rehab (<3 Hours/Day) (Or Return To [facility] If Staff Can Give Tot A)05/06/2024 1618  Subjective: Pleasantly confused no complaints  Examination:  General exam: Appears calm and comfortable, elderly frail Respiratory system: Clear to auscultation. Respiratory effort normal. Cardiovascular system: S1 & S2 heard, RRR. No JVD, murmurs, rubs, gallops or clicks. No pedal edema. Gastrointestinal system: Abdomen is nondistended, soft and nontender. No organomegaly or masses felt. Normal bowel sounds heard. Central nervous system: Alert and oriented to name only, baseline. No focal neurological  deficits. Extremities: Symmetric 4 x 5 power. Skin: No rashes, lesions or ulcers Psychiatry: Judgement and insight appear poor     Discharge Diagnoses:  Principal Problem:   Closed right hip fracture, initial encounter (HCC) Active Problems:   Anxiety   Dementia with behavioral disturbance (HCC)   Persistent atrial fibrillation (HCC)   Acute renal failure superimposed on stage 3b chronic kidney disease (HCC)   Closed fracture of right distal radius and ulna   Displaced intertrochanteric fracture of right femur, initial encounter for closed fracture [facility])      Discharge Exam: Vitals:   05/13/24 0648 05/13/24 0827  BP: 129/68 125/71  Pulse: 98   Resp: 20 16  Temp: 98.3 F (36.8 C) 98.9 F (37.2 C)  SpO2: 97% 97%   Vitals:   05/13/24 0518 05/13/24 0618 05/13/24 0648 05/13/24 0827  BP: 127/65 138/87 129/68 125/71  Pulse: 95 74 98   Resp: 20 20 20 16   Temp: 98.4 F (36.9 C) 98.5 F (36.9 C) 98.3 F (36.8 C) 98.9 F (37.2 C)  TempSrc: Oral Oral Oral   SpO2: 93% 94% 97% 97%  Weight:      Height:          Discharge Instructions   Allergies as of 05/13/2024       Reactions   Hydrochlorothiazide Hives   Penicillins Anaphylaxis, Hives   Cipro  [ciprofloxacin  Hcl] Other (See Comments)   blood pressure issues Unknown if medication increased or decreased blood pressure   Iodinated Contrast Media Diarrhea, Other (See Comments), Hypertension   Elevated the B/P   Iodine -131 Other (See Comments), Hypertension   Raised the blood pressure   Lasix [furosemide] Other (See Comments)   Unknown reaction   Latex Itching   Lipitor [atorvastatin] Other (See Comments)   Myopathy    Lortab [hydrocodone -acetaminophen ] Other (See Comments)   Unknown reaction   Niaspan [niacin] Other (See Comments)   Flu-like symptoms   Statins Other (See Comments)   Myopathy   Toprol  Xl [metoprolol ] Other (See Comments)   Unknown reaction to metoprolol  succinate only, no reaction to tartrate   Vibramycin [doxycycline] Diarrhea, Nausea And Vomiting   Bactrim [sulfamethoxazole-trimethoprim] Hives, Rash   Codeine Nausea  And Vomiting   Nickel Rash   Sulfa Antibiotics Hives, Rash   Sulfonamide Derivatives Hives, Rash      Tegretol [carbamazepine] Rash, Other (See Comments), Hypertension   flushed blood pressure medication out of system        Medication List     STOP taking these medications    potassium chloride  SA 20 MEQ tablet Commonly known as: KLOR-CON  M       TAKE these medications    acetaminophen  500 MG tablet Commonly known as: TYLENOL  Take 2 tablets (1,000 mg total) by mouth every 8 (eight) hours for 14 days. What changed:  medication strength how much to take when to take this reasons to take this   amLODipine  10 MG tablet Commonly known as: NORVASC  Take 1 tablet (10 mg total) by mouth daily.   aspirin  81 MG chewable tablet Chew 1 tablet (81 mg total) by mouth 2 (two) times daily.   busPIRone  7.5 MG tablet Commonly known as: BUSPAR  Take 7.5 mg by mouth 3 (three) times daily.   Depakote Sprinkles 125 MG capsule Generic drug: divalproex Take 125 mg by mouth daily.   FLUoxetine  20 MG capsule Commonly known as: PROZAC  Take 10 mg by mouth daily at 12 noon.   Geri-Tussin 100 MG/5ML liquid Generic  drug: guaiFENesin  Take 10 mLs by mouth every 6 (six) hours as needed.   LORazepam  0.5 MG tablet Commonly known as: ATIVAN  Take 1 tablet (0.5 mg total) by mouth daily at 12 noon. Give one tablet by mouth daily in the afternoon for agitation.   LORazepam  0.5 MG tablet Commonly known as: ATIVAN  Take 1 tablet (0.5 mg total) by mouth every 8 (eight) hours as needed for anxiety.   Macrobid  100 MG capsule Generic drug: nitrofurantoin  (macrocrystal-monohydrate) Take 100 mg by mouth 2 (two) times daily.   metoprolol  tartrate 25 MG tablet Commonly known as: LOPRESSOR  Take 25 mg by mouth 2 (two) times daily.   nystatin cream Commonly known as: MYCOSTATIN Apply 1 Application topically every 12 (twelve) hours as needed (Redness and rash). Apply to under bilateral breast  topically every 12 hours as needed for redness/rash   nystatin powder Commonly known as: MYCOSTATIN/NYSTOP Apply topically.   oxyCODONE  5 MG immediate release tablet Commonly known as: Oxy IR/ROXICODONE  Take 1 tablet (5 mg total) by mouth every 4 (four) hours as needed for up to 7 days for moderate pain (pain score 4-6) or severe pain (pain score 7-10).   polyethylene glycol 17 g packet Commonly known as: MiraLax  Take 17 g by mouth daily for 14 days.   QUEtiapine  50 MG tablet Commonly known as: SEROQUEL  Take 1 tablet (50 mg total) by mouth at bedtime.   senna-docusate 8.6-50 MG tablet Commonly known as: Senokot-S Take 1 tablet by mouth 2 (two) times daily for 14 days.        Contact information for follow-up providers     [name], MD Follow up in 1 week(s).   Specialty: Internal Medicine Contact information: [address] [place] [address] [phone]              Contact information for after-discharge care     Destination     [facility] .   Service: Skilled Nursing Contact information: [address] [place]  [address] [phone]                    Allergies  Allergen Reactions   Hydrochlorothiazide Hives   Penicillins Anaphylaxis and Hives   Cipro  [Ciprofloxacin  Hcl] Other (See Comments)    blood pressure issues Unknown if medication increased or decreased blood pressure   Iodinated Contrast Media Diarrhea, Other (See Comments) and Hypertension    Elevated the B/P   Iodine -131 Other (See Comments) and Hypertension    Raised the blood pressure   Lasix [Furosemide] Other (See Comments)    Unknown reaction   Latex Itching   Lipitor [Atorvastatin] Other (See Comments)    Myopathy    Lortab [Hydrocodone -Acetaminophen ] Other (See Comments)    Unknown reaction   Niaspan [Niacin] Other (See Comments)    Flu-like symptoms   Statins Other (See Comments)    Myopathy   Toprol  Xl [Metoprolol ] Other (See  Comments)    Unknown reaction to metoprolol  succinate only, no reaction to tartrate   Vibramycin [Doxycycline] Diarrhea and Nausea And Vomiting   Bactrim [Sulfamethoxazole-Trimethoprim] Hives and Rash   Codeine Nausea And Vomiting   Nickel Rash   Sulfa Antibiotics Hives and Rash   Sulfonamide Derivatives Hives and Rash        Tegretol [Carbamazepine] Rash, Other (See Comments) and Hypertension    flushed blood pressure medication out of system    You were cared for by a hospitalist during your hospital stay. If you have  any questions about your discharge medications or the care you received while you were in the hospital after you are discharged, you can call the unit and asked to speak with the hospitalist on call if the hospitalist that took care of you is not available. Once you are discharged, your primary care physician will handle any further medical issues. Please note that no refills for any discharge medications will be authorized once you are discharged, as it is imperative that you return to your primary care physician (or establish a relationship with a primary care physician if you do not have one) for your aftercare needs so that they can reassess your need for medications and monitor your lab values.  You were cared for by a hospitalist during your hospital stay. If you have any questions about your discharge medications or the care you received while you were in the hospital after you are discharged, you can call the unit and asked to speak with the hospitalist on call if the hospitalist that took care of you is not available. Once you are discharged, your primary care physician will handle any further medical issues. Please note that NO REFILLS for any discharge medications will be authorized once you are discharged, as it is imperative that you return to your primary care physician (or establish a relationship with a primary care physician if you do not have one) for your  aftercare needs so that they can reassess your need for medications and monitor your lab values.  Please request your Prim.MD to go over all Hospital Tests and Procedure/Radiological results at the follow up, please get all Hospital records sent to your Prim MD by signing hospital release before you go home.  Get CBC, CMP, 2 view Chest X ray checked  by Primary MD during your next visit or SNF MD in 5-7 days ( we routinely change or add medications that can affect your baseline labs and fluid status, therefore we recommend that you get the mentioned basic workup next visit with your PCP, your PCP may decide not to get them or add new tests based on their clinical decision)  On your next visit with your primary care physician please Get Medicines reviewed and adjusted.  If you experience worsening of your admission symptoms, develop shortness of breath, life threatening emergency, suicidal or homicidal thoughts you must seek medical attention immediately by calling 911 or calling your MD immediately  if symptoms less severe.  You Must read complete instructions/literature along with all the possible adverse reactions/side effects for all the Medicines you take and that have been prescribed to you. Take any new Medicines after you have completely understood and accpet all the possible adverse reactions/side effects.   Do not drive, operate heavy machinery, perform activities at heights, swimming or participation in water  activities or provide baby sitting services if your were admitted for syncope or siezures until you have seen by Primary MD or a Neurologist and advised to do so again.  Do not drive when taking Pain medications.   Procedures/Studies: DG MINI C-ARM IMAGE ONLY Result Date: 05/10/2024 There is no interpretation for this exam.  This order is for images obtained during a surgical procedure.  Please See Surgeries Tab for more information regarding the procedure.   US  RENAL Result  Date: 05/07/2024 CLINICAL DATA:  Acute renal injury EXAM: RENAL / URINARY TRACT ULTRASOUND COMPLETE COMPARISON:  None Available. FINDINGS: Right Kidney: Renal measurements: 8.9 x 4.7 x 5.1 cm = volume: 111 mL.  Echogenicity within normal limits. No mass or hydronephrosis visualized. Left Kidney: Renal measurements: 10.8 x 5.3 x 5.2 cm = volume: 154 mL. Small anechoic 1 cm cyst. No hydronephrosis Bladder: Appears normal for degree of bladder distention. Other: None. IMPRESSION: 1. No hydronephrosis. 2. Small LEFT renal cyst. Electronically Signed   By: Jackquline Boxer M.D.   On: 05/07/2024 18:48   DG HIP UNILAT WITH PELVIS 1V RIGHT Result Date: 05/06/2024 CLINICAL DATA:  Status post right hip fixation EXAM: DG HIP (WITH OR WITHOUT PELVIS) 1V RIGHT COMPARISON:  Films from earlier in the same day. FINDINGS: Proximal medullary rod with distal fixation is noted. Two fixation screws traverse the femoral neck. No soft tissue abnormality is noted. IMPRESSION: Status post ORIF right proximal femoral fracture. Electronically Signed   By: Oneil Devonshire M.D.   On: 05/06/2024 01:31   DG FEMUR, MIN 2 VIEWS RIGHT Result Date: 05/05/2024 CLINICAL DATA:  Fracture, postop. EXAM: RIGHT FEMUR 2 VIEWS COMPARISON:  Preoperative imaging. FINDINGS: Femoral intramedullary nail with trans trochanteric and distal locking screw fixation traverse proximal femur fracture. Persistent displacement of the lesser trochanteric fragment. No periprosthetic lucency. The distal femur is intact. Recent postsurgical change includes air and edema in the soft tissues with skin staples laterally. IMPRESSION: ORIF of proximal femur fracture. No immediate postoperative complication. Electronically Signed   By: Andrea Gasman M.D.   On: 05/05/2024 20:11   DG HIP UNILAT WITH PELVIS 2-3 VIEWS RIGHT Result Date: 05/05/2024 CLINICAL DATA:  Elective surgery. EXAM: DG HIP (WITH OR WITHOUT PELVIS) 2-3V RIGHT COMPARISON:  Radiograph yesterday FINDINGS: Seven  fluoroscopic spot views of the right proximal femur and hip submitted from the operating room. Femoral intramedullary nail with trans trochanteric and distal locking screw fixation traverse proximal femur fracture. Fluoroscopy time 1 minutes 3 seconds. Dose 12.93 mGy. IMPRESSION: Intraoperative fluoroscopy during proximal femur fracture ORIF. Electronically Signed   By: Andrea Gasman M.D.   On: 05/05/2024 20:10   DG C-Arm 1-60 Min-No Report Result Date: 05/05/2024 Fluoroscopy was utilized by the requesting physician.  No radiographic interpretation.   DG Wrist Complete Right Result Date: 05/04/2024 CLINICAL DATA:  pain after fall EXAM: RIGHT WRIST - COMPLETE 3+ VIEW COMPARISON:  None Available. FINDINGS: Diffusely decreased bone density. Acute intra-articular, comminuted, and displaced distal radial metadiaphysis fracture. Question anterior dislocation of the radius in relation to the carpal bones. Acute displaced and comminuted distal ulnar fracture. Scaphoid degenerative changes. Subcutaneus soft tissue edema. IMPRESSION: 1. Acute intra-articular, comminuted, and displaced distal radial metadiaphysis fracture. 2. Acute displaced and comminuted distal ulnar fracture. 3. Question anterior dislocation of the radius in relation to the carpal bones. Electronically Signed   By: Morgane  Naveau M.D.   On: 05/04/2024 21:15   DG Tibia/Fibula Right Result Date: 05/04/2024 CLINICAL DATA:  pain after fall EXAM: RIGHT TIBIA AND FIBULA - 2 VIEW COMPARISON:  None Available. FINDINGS: There is no evidence of fracture or other focal bone lesions. Knee and ankle grossly unremarkable. Soft tissues are unremarkable. Vascular calcification. IMPRESSION: Negative for acute traumatic injury. Electronically Signed   By: Morgane  Naveau M.D.   On: 05/04/2024 21:13   DG Humerus Right Result Date: 05/04/2024 CLINICAL DATA:  pain after fall EXAM: RIGHT HUMERUS - 2+ VIEW COMPARISON:  None Available. FINDINGS: There is no  evidence of fracture or other focal bone lesions. Soft tissues are unremarkable. IMPRESSION: Negative. Electronically Signed   By: Morgane  Naveau M.D.   On: 05/04/2024 21:12   DG Hip Unilat W  or Wo Pelvis 2-3 Views Right Result Date: 05/04/2024 CLINICAL DATA:  pain after fall EXAM: DG HIP (WITH OR WITHOUT PELVIS) 2-3V RIGHT COMPARISON:  None Available. FINDINGS: Acute displaced and comminuted right femoral intertrochanteric fracture. No right hip dislocation. No acute displaced fracture or dislocation of left hip. No acute displaced fracture or diastasis of the bones of the pelvis. There is no evidence of arthropathy or other focal bone abnormality. IMPRESSION: Acute displaced and comminuted right femoral intertrochanteric fracture. Electronically Signed   By: Morgane  Naveau M.D.   On: 05/04/2024 21:12   DG Chest Port 1 View Result Date: 05/04/2024 CLINICAL DATA:  pain after fall EXAM: PORTABLE CHEST 1 VIEW COMPARISON:  Chest x-ray 05/01/2023 FINDINGS: The heart and mediastinal contours are unchanged. Atherosclerotic plaque. No focal consolidation. No pulmonary edema. No pleural effusion. No pneumothorax. No acute osseous abnormality. IMPRESSION: 1. No active disease. 2.  Aortic Atherosclerosis (ICD10-I70.0). Electronically Signed   By: Morgane  Naveau M.D.   On: 05/04/2024 21:10     The results of significant diagnostics from this hospitalization (including imaging, microbiology, ancillary and laboratory) are listed below for reference.     Microbiology: Recent Results (from the past 240 hours)  Surgical PCR screen     Status: None   Collection Time: 05/05/24  2:19 AM   Specimen: Nasal Mucosa; Nasal Swab  Result Value Ref Range Status   MRSA, PCR NEGATIVE NEGATIVE Final   Staphylococcus aureus NEGATIVE NEGATIVE Final    Comment: (NOTE) The Xpert SA Assay (FDA approved for NASAL specimens in patients 39 years of age and older), is one component of a comprehensive surveillance program. It is  not intended to diagnose infection nor to guide or monitor treatment. Performed at [facility], [address] [place], [place] [address]      Labs: BNP (last 3 results) No results for input(s): BNP in the last 8760 hours. Basic Metabolic Panel: Recent Labs  Lab 05/09/24 0456 05/10/24 0739 05/10/24 1555 05/11/24 0357 05/12/24 1018 05/13/24 0155  NA 135 137 134* 135 135 133*  K 5.1 4.6 5.7* 5.4* 4.6 4.0  CL 108 108 108 107 106 105  CO2 16*  --  19* 19* 17* 21*  GLUCOSE 94 91 155* 122* 89 86  BUN 48* 34* 35* 38* 41* 33*  CREATININE 1.72* 1.30* 1.31* 1.44* 1.32* 1.20*  CALCIUM  8.6*  --  8.7* 9.2 8.9 8.4*  MG 2.2  --  2.0 2.2 2.0 1.7   Liver Function Tests: No results for input(s): AST, ALT, ALKPHOS, BILITOT, PROT, ALBUMIN  in the last 168 hours. No results for input(s): LIPASE, AMYLASE in the last 168 hours. No results for input(s): AMMONIA in the last 168 hours. CBC: Recent Labs  Lab 05/09/24 1047 05/10/24 0739 05/10/24 1555 05/11/24 0357 05/12/24 1018 05/13/24 0155  WBC 10.4  --  8.0 9.8 11.2* 9.0  HGB 7.6* 7.1* 7.3* 7.6* 8.0* 6.9*  HCT 23.8* 21.0* 23.1* 23.9* 24.7* 21.4*  MCV 96.4  --  97.1 96.0 96.5 98.6  PLT 122*  --  122* 148* 185 177   Cardiac Enzymes: No results for input(s): CKTOTAL, CKMB, CKMBINDEX, TROPONINI in the last 168 hours. BNP: Invalid input(s): POCBNP CBG: No results for input(s): GLUCAP in the last 168 hours. D-Dimer No results for input(s): DDIMER in the last 72 hours. Hgb A1c No results for input(s): HGBA1C in the last 72 hours. Lipid Profile No results for input(s): CHOL, HDL, LDLCALC, TRIG, CHOLHDL, LDLDIRECT in the last 72  hours. Thyroid  function studies No results for input(s): TSH, T4TOTAL, T3FREE, THYROIDAB in the last 72 hours.  Invalid input(s): FREET3 Anemia work up No results for input(s): VITAMINB12, FOLATE, FERRITIN, TIBC, IRON, RETICCTPCT in  the last 72 hours. Urinalysis    Component Value Date/Time   COLORURINE YELLOW 01/16/2024 1020   APPEARANCEUR CLEAR 01/16/2024 1020   LABSPEC 1.012 01/16/2024 1020   PHURINE 5.0 01/16/2024 1020   GLUCOSEU NEGATIVE 01/16/2024 1020   HGBUR NEGATIVE 01/16/2024 1020   BILIRUBINUR NEGATIVE 01/16/2024 1020   KETONESUR NEGATIVE 01/16/2024 1020   PROTEINUR NEGATIVE 01/16/2024 1020   UROBILINOGEN 0.2 05/22/2015 1950   NITRITE NEGATIVE 01/16/2024 1020   LEUKOCYTESUR MODERATE (A) 01/16/2024 1020   Sepsis Labs Recent Labs  Lab 05/10/24 1555 05/11/24 0357 05/12/24 1018 05/13/24 0155  WBC 8.0 9.8 11.2* 9.0   Microbiology Recent Results (from the past 240 hours)  Surgical PCR screen     Status: None   Collection Time: 05/05/24  2:19 AM   Specimen: Nasal Mucosa; Nasal Swab  Result Value Ref Range Status   MRSA, PCR NEGATIVE NEGATIVE Final   Staphylococcus aureus NEGATIVE NEGATIVE Final    Comment: (NOTE) The Xpert SA Assay (FDA approved for NASAL specimens in patients 23 years of age and older), is one component of a comprehensive surveillance program. It is not intended to diagnose infection nor to guide or monitor treatment. Performed at [facility], [address] [place], [place] [address]      Time coordinating discharge:  I have spent 35 minutes face to face with the patient and on the ward discussing the patients care, assessment, plan and disposition with other care givers. >50% of the time was devoted counseling the patient about the risks and benefits of treatment/Discharge disposition and coordinating care.   SIGNED:   Burgess JAYSON Dare, MD  [facility] 05/13/2024, 12:31 PM   If 7PM-7AM, please contact night-coverage

## 2024-05-13 NOTE — Progress Notes (Signed)
 Physical Therapy Treatment Patient Details Name: Tiffany Black MRN: [id number] DOB: 03/30/1934 Today's Date: 05/13/2024   History of Present Illness Pt is an 89 y/o F presenting to ED on 8/24 from SNF after fall, found to have R intertrochanteric femur fx and R distal ulnar/radius fx. S/p R femur ORIF, R distal radius fx managed in splint.S/p R radial ORIF on 8/30. PMH includes CVA, HTN, dementia, anxiety, chronic HFpEF, A fib, CKD III    PT Comments  Pt received in bed and alert today, however, very fearful of falling with all activity, even bed mobility, which limited her ability to mobilize. Pt needed max A +2 to come to EOB. Worked on fwd wt shift, which pt tolerated slightly better when stedy was placed in front of her. Came to partial standing with use of stedy and max A +2, 2x. Pt did not need a PT re-eval today after surgery as no significant functional change since before surgery. Patient will benefit from continued inpatient follow up therapy, <3 hours/day. PT will continue to follow.     If plan is discharge home, recommend the following: Two people to help with walking and/or transfers;Two people to help with bathing/dressing/bathroom;Assistance with feeding;Assistance with cooking/housework;Assist for transportation;Help with stairs or ramp for entrance;Direct supervision/assist for financial management;Direct supervision/assist for medications management;Supervision due to cognitive status   Can travel by private vehicle     No  Equipment Recommendations  Wheelchair (measurements PT);Wheelchair cushion (measurements PT);BSC/3in1;Hospital bed;Hoyer lift    Recommendations for Other Services       Precautions / Restrictions Precautions Precautions: Fall Recall of Precautions/Restrictions: Impaired Precaution/Restrictions Comments: LUE mitt Restrictions Weight Bearing Restrictions Per Provider Order: Yes RUE Weight Bearing Per Provider Order: Non weight bearing RLE  Weight Bearing Per Provider Order: Weight bearing as tolerated Other Position/Activity Restrictions: splint/ace wrap RUE     Mobility  Bed Mobility Overal bed mobility: Needs Assistance Bed Mobility: Supine to Sit, Sit to Supine, Rolling Rolling: Max assist, +2 for physical assistance   Supine to sit: Max assist, +2 for physical assistance Sit to supine: Max assist, +2 for physical assistance   General bed mobility comments: pt able to physically assist with L side but needs max A +2 in part due to her fear of falling and pt often resisting motion because of this, esp rolling.    Transfers Overall transfer level: Needs assistance Equipment used: Ambulation equipment used Transfers: Sit to/from Stand Sit to Stand: Max assist, +2 physical assistance, Via lift equipment           General transfer comment: pt able to pull with L hand on stedy rail and through LLE, avoidant of using RLE but did put wt on that side once up. Was unable to achieve full standing but tolerated sit to partial stand 2x with max A +2 Transfer via Lift Equipment: Stedy  Ambulation/Gait               General Gait Details: unable   Stairs             Wheelchair Mobility     Tilt Bed    Modified Rankin (Stroke Patients Only)       Balance Overall balance assessment: Needs assistance Sitting-balance support: Feet supported, Single extremity supported Sitting balance-Leahy Scale: Poor Sitting balance - Comments: posterior lean in sitting. Worked on fwd wt shift. Pt a little more willing to lean fwd once stedy was in front of her Postural control: Posterior lean Standing balance support:  Single extremity supported, During functional activity Standing balance-Leahy Scale: Poor                              Communication Communication Communication: No apparent difficulties  Cognition Arousal: Alert Behavior During Therapy: Flat affect   PT - Cognitive impairments:  History of cognitive impairments                       PT - Cognition Comments: pt alert throughout session, very anxious with mobility, even bed mobility Following commands: Impaired Following commands impaired: Follows one step commands inconsistently, Follows one step commands with increased time    Cueing Cueing Techniques: Verbal cues, Gestural cues, Tactile cues, Visual cues  Exercises General Exercises - Lower Extremity Ankle Circles/Pumps: AROM, Both, 10 reps, Supine Heel Slides: AAROM, Supine, Right, 10 reps    General Comments General comments (skin integrity, edema, etc.): VSS      Pertinent Vitals/Pain Pain Assessment Pain Assessment: Faces Faces Pain Scale: Hurts even more Pain Location: R leg and R UE with mvmt Pain Descriptors / Indicators: Discomfort, Grimacing, Guarding, Moaning Pain Intervention(s): Limited activity within patient's tolerance, Monitored during session    Home Living                          Prior Function            PT Goals (current goals can now be found in the care plan section) Acute Rehab PT Goals Patient Stated Goal: to reduce pain PT Goal Formulation: With family Time For Goal Achievement: 05/20/24 Potential to Achieve Goals: Fair Progress towards PT goals: Progressing toward goals    Frequency    Min 1X/week      PT Plan      Co-evaluation PT/OT/SLP Co-Evaluation/Treatment: Yes Reason for Co-Treatment: Complexity of the patient's impairments (multi-system involvement);Necessary to address cognition/behavior during functional activity;To address functional/ADL transfers PT goals addressed during session: Mobility/safety with mobility OT goals addressed during session: ADL's and self-care;Strengthening/ROM      AM-PAC PT 6 Clicks Mobility   Outcome Measure  Help needed turning from your back to your side while in a flat bed without using bedrails?: Total Help needed moving from lying on your  back to sitting on the side of a flat bed without using bedrails?: A Lot Help needed moving to and from a bed to a chair (including a wheelchair)?: Total Help needed standing up from a chair using your arms (e.g., wheelchair or bedside chair)?: Total Help needed to walk in hospital room?: Total Help needed climbing 3-5 steps with a railing? : Total 6 Click Score: 7    End of Session Equipment Utilized During Treatment: Gait belt Activity Tolerance: Other (comment) (limited by fear of falling) Patient left: in bed;with bed alarm set;with call bell/phone within reach;with restraints reapplied Nurse Communication: Mobility status PT Visit Diagnosis: Pain;History of falling (Z91.81);Difficulty in walking, not elsewhere classified (R26.2);Unsteadiness on feet (R26.81);Other abnormalities of gait and mobility (R26.89);Muscle weakness (generalized) (M62.81) Pain - Right/Left: Right Pain - part of body: Hip;Arm     Time: 0940-1005 PT Time Calculation (min) (ACUTE ONLY): 25 min  Charges:    $Therapeutic Activity: 8-22 mins PT General Charges $$ ACUTE PT VISIT: 1 Visit                     Richerd Lipoma, PT  Acute Rehab  Services Secure chat preferred Office [phone]    Richerd CROME Ki Corbo 05/13/2024, 11:06 AM

## 2024-05-13 NOTE — Progress Notes (Signed)
 During shift change, patient kept yelling out, this RN went in to check on patient and saw that patient had ripped the ace wrap off of her bandaged arm. Pt was very anxious and angry, yelling to get her out of the bed and to let her go. Pt is very confused, stating that her injured arm is fine and that she is worried about her mother and young children and needs to leave. RN tried to redirect pt without any success, RN administered po Ativan  at this time, rebandaged arm, and reapplied mitt to left hand. RN will continue to monitor pt.   Bari HERO Gagandeep Kossman

## 2024-05-13 NOTE — Plan of Care (Signed)
  Problem: Education: Goal: Knowledge of General Education information will improve Description: Including pain rating scale, medication(s)/side effects and non-pharmacologic comfort measures Outcome: Progressing   Problem: Clinical Measurements: Goal: Ability to maintain clinical measurements within normal limits will improve Outcome: Progressing Goal: Will remain free from infection Outcome: Progressing Goal: Diagnostic test results will improve Outcome: Progressing Goal: Respiratory complications will improve Outcome: Progressing Goal: Cardiovascular complication will be avoided Outcome: Progressing   Problem: Activity: Goal: Risk for activity intolerance will decrease Outcome: Progressing   Problem: Elimination: Goal: Will not experience complications related to bowel motility Outcome: Progressing Goal: Will not experience complications related to urinary retention Outcome: Progressing   Problem: Pain Managment: Goal: General experience of comfort will improve and/or be controlled Outcome: Progressing   Problem: Safety: Goal: Ability to remain free from injury will improve Outcome: Progressing   Problem: Skin Integrity: Goal: Risk for impaired skin integrity will decrease Outcome: Progressing

## 2024-05-13 NOTE — TOC Transition Note (Addendum)
 Transition of Care [facility]) - Discharge Note   Patient Details  Name: Tiffany Black MRN: [id number] Date of Birth: July 05, 1934  [facility]) CM/SW Contact:  Bridget Cordella Simmonds, LCSW Phone Number: 05/13/2024, 2:44 PM   Clinical Narrative:   Pt discharging to [facility], room 404A.  RN call report to [phone].  PTAR called 1440.   0810: CSW confirmed with [facility] that they can receive pt today.   1530: CSW notified  by RN that pt has not had BM in multiple days, confirmed with [facility] that they cannot receive pt if no BM.  Son notified.  MD aware.   Final next level of care: Skilled Nursing Facility Barriers to Discharge: Barriers Resolved   Patient Goals and CMS Choice     Choice offered to / list presented to : Adult Children (son Tiffany Black)      Discharge Placement              Patient chooses bed at: [facility] Patient to be transferred to facility by: ptar Name of family member notified: son Tiffany Black Patient and family notified of of transfer: 05/13/24  Discharge Plan and Services Additional resources added to the After Visit Summary for   In-house Referral: Clinical Social Work   Post Acute Care Choice: Skilled Nursing Facility                               Social Drivers of Health (SDOH) Interventions SDOH Screenings   Food Insecurity: No Food Insecurity (05/05/2024)  Housing: Low Risk  (05/05/2024)  Transportation Needs: No Transportation Needs (05/05/2024)  Utilities: Not At Risk (05/05/2024)  Social Connections: Unknown (05/05/2024)  Tobacco Use: Low Risk  (05/10/2024)     Readmission Risk Interventions    08/29/2022   10:19 AM  Readmission Risk Prevention Plan  Transportation Screening Complete  Medication Review (RN Care Manager) Complete  PCP or Specialist appointment within 3-5 days of discharge Complete  HRI or Home Care Consult Complete  SW Recovery Care/Counseling Consult Complete  Palliative Care Screening  Not Applicable  Skilled Nursing Facility Complete

## 2024-05-14 DIAGNOSIS — S72001A Fracture of unspecified part of neck of right femur, initial encounter for closed fracture: Secondary | ICD-10-CM | POA: Diagnosis not present

## 2024-05-14 LAB — BPAM RBC
Blood Product Expiration Date: 202509202359
ISSUE DATE / TIME: 202509020350
Unit Type and Rh: 6200

## 2024-05-14 LAB — TYPE AND SCREEN
ABO/RH(D): A POS
Antibody Screen: NEGATIVE
Unit division: 0

## 2024-05-14 MED ORDER — BISACODYL 10 MG RE SUPP
10.0000 mg | Freq: Once | RECTAL | Status: AC
  Administered 2024-05-14: 10 mg via RECTAL
  Filled 2024-05-14: qty 1

## 2024-05-14 NOTE — Progress Notes (Signed)
 NT went in pt's room and saw that pt had ripped the entire cast/splint off of her arm. This RN and charge RN had to cut it to reapply and rewrap it. Pt kept saying the whole time that she was going to hit the staff for putting it back on and that she was just going to take it back off again. Pt is not redirectable. Staff was able to reapply the cast/splint and mitt to hand. Pt went to sleep after taking her medications. RN will continue to monitor pt.   Bari HERO Victor Granados

## 2024-05-14 NOTE — Progress Notes (Addendum)
 PROGRESS NOTE Tiffany Black  FMW:[id number] DOB: Jul 07, 1934 DOA: 05/04/2024 PCP: Loreli Elsie Tiffany Black., MD  Brief Narrative/Hospital Course: Tiffany Black is a 89 y.o. year old female with PMH of  of advanced dementia, chronic HFpEF, atrial fibrillation no longer anticoagulated, CKD 3 who presents with right wrist pain and right leg pain after a fall.  Subsequently found to have right intratrochanteric fracture, distal ulnar and radius fracture who underwent ORIF for the right femur fracture on 8/25,underwent ORIF for her right hand on 8/30. During the hospitalization due to acute blood loss anemia required PRBC transfusion.  Hospital course also complicated by AKI resolving with IV fluids.  PT OT recommending SNF,waiting placement.  8 Discharge pending bowel movement. She had BM today and being discharged  ADMIT DATE: 05/04/2024 DISCHAREGE DATE: 05/14/2024  Subjective: Seen and examined today Alert awake will tell me her name and that she is in [facility] Splint in place on right arm Overnight afebrile BP stable on room air Labs/hemoglobin at 8.3 g 9/2 She has been refusing vitals this morning, has been combative, last night ripped her cast/splint off   Assessment and plan:  Right intertrochanteric femur fracture s/p ORIF 8/25 Outpatient orthopedic inputs.  Now on aspirin  81 twice daily for DVT prophylaxis.  Continue multimodal pain management, continue PT OT and SNF placement   Right distal ulnar/radius fracture S/p ORIF by orthopedic 8/30.  Outpatient follow-up in 10-14 days.  Plan to keep the splint clean and dry. Hand surgery aware, patient with dementia at risk for splint removal, patient is to see hand surgery in 10 to 14 days Continue nonweightbearing and maintain splint until then   Constipation: Ordering Dulcolax suppository, continue bowel regimen  AKI on CKD 3B Hyperkalemia-resolved. Baseline creatinine 1.3, initially trended up to 1.4 now at baseline. Renal US   NAD   ABLA: Baseline hemoglobin 9.0, postop anemia after both of her surgeries.Status post 1 unit PRBC 8/27, 9/2.  Monitor intermittently   Permanent atrial fibrillation Rate controlled continue metoprolol .  Not on anticoagulation at baseline.     Chronic HFpEF Euvolemic. holding diuretics post op with AKI   Dementia/anxiety Pleasantly demented, does not remember falling or her injuries.  Continue delirium precaution, fall precaution Continue home Prozac , BuSpar , lorazepam , Seroquel .   Hypertension Well-controlled on this.  Norvasc , metoprolol .  IV as needed  Mobility: PT Orders: Active  PT Follow up Rec: Skilled Nursing-Short Term Rehab (<3 Hours/Day) (Or Return To [facility] If Staff Can Give Tot A)05/13/2024 1059   DVT prophylaxis: SCDs Start: 05/05/24 0015 Code Status:   Code Status: Full Code Family Communication: plan of care discussed with patient at bedside. Patient status is: Remains hospitalized because of severity of illness Level of care: Med-Surg   Dispo:Anticipated disposition: snf once he has bowel movement  Objective: Vitals last 24 hrs: Vitals:   05/13/24 0827 05/13/24 1524 05/13/24 1600 05/13/24 2058  BP: 125/71 (!) 111/55 112/60 (!) 116/102  Pulse:   94 (!) 106  Resp: 16  16 17   Temp: 98.9 F (37.2 C)  98.8 F (37.1 C) 97.9 F (36.6 C)  TempSrc:   Oral Oral  SpO2: 97%  96% 95%  Weight:      Height:        Physical Examination: General exam: alert awake, pleasantly confused, HEENT:Oral mucosa moist, Ear/Nose WNL grossly Respiratory system: Bilaterally clear BS,no use of accessory muscle Cardiovascular system: S1 & S2 +, No JVD. Gastrointestinal system: Abdomen soft,NT,ND, BS+ Nervous System: Alert, awake, moving all  extremities,and following commands. Extremities: LE edema neg, distal extremities warm.  Right arm with splint in place Skin: No rashes,no icterus. MSK: Normal muscle bulk,tone, power   Medications reviewed:  Scheduled Meds:   acetaminophen   1,000 mg Oral Q8H   amLODipine   10 mg Oral Daily   aspirin   81 mg Oral BID   busPIRone   7.5 mg Oral TID   Chlorhexidine  Gluconate Cloth  6 each Topical Daily   feeding supplement  237 mL Oral BID BM   FLUoxetine   10 mg Oral Daily   LORazepam   0.5 mg Oral Q1200   metoprolol  tartrate  25 mg Oral BID   pneumococcal 20-valent conjugate vaccine  0.5 mL Intramuscular Tomorrow-1000   QUEtiapine   50 mg Oral QHS   Continuous Infusions:  tranexamic acid      Diet: Diet Order             Diet regular Room service appropriate? No; Fluid consistency: Thin  Diet effective now                   Data Reviewed: I have personally reviewed following labs and imaging studies ( see epic result tab) CBC: Recent Labs  Lab 05/09/24 1047 05/10/24 0739 05/10/24 1555 05/11/24 0357 05/12/24 1018 05/13/24 0155 05/13/24 1255  WBC 10.4  --  8.0 9.8 11.2* 9.0  --   HGB 7.6*   < > 7.3* 7.6* 8.0* 6.9* 8.3*  HCT 23.8*   < > 23.1* 23.9* 24.7* 21.4* 25.1*  MCV 96.4  --  97.1 96.0 96.5 98.6  --   PLT 122*  --  122* 148* 185 177  --    < > = values in this interval not displayed.   CMP: Recent Labs  Lab 05/09/24 0456 05/10/24 0739 05/10/24 1555 05/11/24 0357 05/12/24 1018 05/13/24 0155  NA 135 137 134* 135 135 133*  K 5.1 4.6 5.7* 5.4* 4.6 4.0  CL 108 108 108 107 106 105  CO2 16*  --  19* 19* 17* 21*  GLUCOSE 94 91 155* 122* 89 86  BUN 48* 34* 35* 38* 41* 33*  CREATININE 1.72* 1.30* 1.31* 1.44* 1.32* 1.20*  CALCIUM  8.6*  --  8.7* 9.2 8.9 8.4*  MG 2.2  --  2.0 2.2 2.0 1.7   GFR: Estimated Creatinine Clearance: 38 mL/min (A) (by C-G formula based on SCr of 1.2 mg/dL (H)). No results for input(s): AST, ALT, ALKPHOS, BILITOT, PROT, ALBUMIN  in the last 168 hours. No results for input(s): LIPASE, AMYLASE in the last 168 hours. No results for input(s): AMMONIA in the last 168 hours. Coagulation Profile: No results for input(s): INR, PROTIME in the last 168  hours. Unresulted Labs (From admission, onward)     Start     Ordered   05/08/24 0500  Basic metabolic panel with GFR  Daily,   R     Question:  Specimen collection method  Answer:  Lab=Lab collect   05/07/24 0737   05/08/24 0500  CBC  Daily,   R     Question:  Specimen collection method  Answer:  Lab=Lab collect   05/07/24 0737   05/08/24 0500  Magnesium   Daily,   R     Question:  Specimen collection method  Answer:  Lab=Lab collect   05/07/24 0737           Antimicrobials/Microbiology: Anti-infectives (From admission, onward)    Start     Dose/Rate Route Frequency Ordered Stop   05/10/24 0700  ceFAZolin  (ANCEF ) IVPB 2g/100 mL premix  Status:  Discontinued        2 g 200 mL/hr over 30 Minutes Intravenous On call to O.R. 05/10/24 9348 05/10/24 0652   05/10/24 0700  ceFAZolin  (ANCEF ) IVPB 2g/100 mL premix  Status:  Discontinued        2 g 200 mL/hr over 30 Minutes Intravenous On call to O.R. 05/10/24 0651 05/10/24 1001   05/06/24 0800  vancomycin  (VANCOCIN ) IVPB 1000 mg/200 mL premix        1,000 mg 200 mL/hr over 60 Minutes Intravenous Every 24 hours 05/05/24 2106 05/07/24 0855   05/05/24 2200  vancomycin  (VANCOCIN ) IVPB 1000 mg/200 mL premix  Status:  Discontinued        1,000 mg 200 mL/hr over 60 Minutes Intravenous Every 24 hours 05/05/24 2058 05/05/24 2106   05/05/24 1806  vancomycin  (VANCOCIN ) powder  Status:  Discontinued          As needed 05/05/24 1806 05/05/24 1833   05/05/24 1330  vancomycin  (VANCOCIN ) IVPB 1000 mg/200 mL premix        1,000 mg 200 mL/hr over 60 Minutes Intravenous On call to O.R. 05/05/24 0833 05/05/24 1703         Component Value Date/Time   SDES FLUID PLEURAL 02/15/2023 1447   SDES FLUID PLEURAL 02/15/2023 1447   SPECREQUEST BOTTLES DRAWN AEROBIC AND ANAEROBIC 02/15/2023 1447   SPECREQUEST NONE 02/15/2023 1447   CULT  02/15/2023 1447    NO GROWTH 5 DAYS Performed at [facility], [address], [place], [place] [address]     REPTSTATUS 02/20/2023 FINAL 02/15/2023 1447   REPTSTATUS 02/15/2023 FINAL 02/15/2023 1447    Procedures: Procedure(s) (LRB): OPEN REDUCTION INTERNAL FIXATION (ORIF) WRIST FRACTURE (Right)   Mennie LAMY, MD [facility] 05/14/2024, 2:24 PM

## 2024-05-14 NOTE — Progress Notes (Signed)
 Pt combative and using profane language this morning. Pt refused to have labs collected/drawn.

## 2024-05-14 NOTE — Progress Notes (Signed)
 Pt jerking arm away from staff, refusing morning vitals.   Bari HERO Tiffany Black

## 2024-05-14 NOTE — TOC Transition Note (Signed)
 Transition of Care [facility]) - Discharge Note   Patient Details  Name: Tiffany Black MRN: [id number] Date of Birth: [birth year]  [facility] CM/SW Contact:  Bridget Cordella Simmonds, LCSW Phone Number: 05/14/2024, 2:44 PM   Clinical Narrative:    Pt discharging to [facility], room 404A.  RN call report to [phone].   PTAR called 1445.  1430: Successful BM,  CSW confirmed with [facility] they can receive pt.   Final next level of care: Skilled Nursing Facility Barriers to Discharge: Barriers Resolved   Patient Goals and CMS Choice     Choice offered to / list presented to : Adult Children (son [name])      Discharge Placement              Patient chooses bed at: [facility] Patient to be transferred to facility by: ptar Name of family member notified: son [name] Patient and family notified of of transfer: 05/14/24  Discharge Plan and Services Additional resources added to the After Visit Summary for   In-house Referral: Clinical Social Work   Post Acute Care Choice: Skilled Nursing Facility                               Social Drivers of Health (SDOH) Interventions SDOH Screenings   Food Insecurity: No Food Insecurity (05/05/2024)  Housing: Low Risk  (05/05/2024)  Transportation Needs: No Transportation Needs (05/05/2024)  Utilities: Not At Risk (05/05/2024)  Social Connections: Unknown (05/05/2024)  Tobacco Use: Low Risk  (05/10/2024)     Readmission Risk Interventions    08/29/2022   10:19 AM  Readmission Risk Prevention Plan  Transportation Screening Complete  Medication Review (RN Care Manager) Complete  PCP or Specialist appointment within 3-5 days of discharge Complete  HRI or Home Care Consult Complete  SW Recovery Care/Counseling Consult Complete  Palliative Care Screening Not Applicable  Skilled Nursing Facility Complete

## 2024-05-14 NOTE — Progress Notes (Signed)
 DC order noted per MD. DC RN at bedside. Patient disposition with plan to [facility] via [facility]. Transfer packet completed with AVS/med necessity.

## 2024-05-14 NOTE — Progress Notes (Addendum)
 Report called to Maggie, RN  nursing supervisor @ [facility] no questions at this time.Patient discharging to facility with foley catheter to follow up with urology in week per Dr. CHRISTOBAL, Mennie. Patient currently  Awaiting PTAR for transport

## 2024-05-15 DIAGNOSIS — I4819 Other persistent atrial fibrillation: Secondary | ICD-10-CM | POA: Diagnosis not present

## 2024-05-15 DIAGNOSIS — F0390 Unspecified dementia without behavioral disturbance: Secondary | ICD-10-CM | POA: Diagnosis not present

## 2024-05-15 DIAGNOSIS — I10 Essential (primary) hypertension: Secondary | ICD-10-CM

## 2024-05-15 DIAGNOSIS — S82201D Unspecified fracture of shaft of right tibia, subsequent encounter for closed fracture with routine healing: Secondary | ICD-10-CM | POA: Diagnosis not present

## 2024-05-15 DIAGNOSIS — I69320 Aphasia following cerebral infarction: Secondary | ICD-10-CM | POA: Diagnosis not present

## 2024-05-15 DIAGNOSIS — N179 Acute kidney failure, unspecified: Secondary | ICD-10-CM | POA: Diagnosis not present

## 2024-05-15 DIAGNOSIS — I5032 Chronic diastolic (congestive) heart failure: Secondary | ICD-10-CM | POA: Diagnosis not present

## 2024-05-15 DIAGNOSIS — I480 Paroxysmal atrial fibrillation: Secondary | ICD-10-CM | POA: Diagnosis not present

## 2024-05-19 DIAGNOSIS — R4182 Altered mental status, unspecified: Secondary | ICD-10-CM | POA: Diagnosis not present

## 2024-05-22 ENCOUNTER — Ambulatory Visit: Admitting: Orthopedic Surgery

## 2024-05-22 ENCOUNTER — Other Ambulatory Visit (INDEPENDENT_AMBULATORY_CARE_PROVIDER_SITE_OTHER): Payer: Self-pay

## 2024-05-22 DIAGNOSIS — S72001A Fracture of unspecified part of neck of right femur, initial encounter for closed fracture: Secondary | ICD-10-CM | POA: Diagnosis not present

## 2024-05-22 DIAGNOSIS — S72141A Displaced intertrochanteric fracture of right femur, initial encounter for closed fracture: Secondary | ICD-10-CM

## 2024-05-22 NOTE — Progress Notes (Signed)
 Orthopedic Surgery Post-operative Office Visit  Procedure: right intertrochanteric femur fracture CMN Date of Surgery: 05/05/2024 (~2 weeks post-op)  Assessment: Patient is a 89 y.o. who is doing as expected after surgery   Plan: -Operative plans complete -Staples removed today in office -Okay to let soap/water  run over the incision but do not submerge -DVT ppx: ASA 81mg  BID -Weightbearing as tolerated -Pain management: tylenol  as needed -Return to office in 4 weeks, x-rays needed at next visit: AP/lateral right hip  ___________________________________________________________________________   Subjective: Patient has been doing well since discharge from the hospital.  She says today that the hip is not bothering her.  She has been working with physical therapy.  She has been walking small distances.  She is not back to baseline in terms of walking per her son.  No redness or drainage has been noticed around her incisions.  Objective:  General: no acute distress, appropriate affect Neurologic: alert, answering questions appropriately, following commands Respiratory: unlabored breathing on room air Skin: incisions are well-approximated with no erythema, induration, active/expressible drainage  MSK (RLE): No pain through passive range of motion of the hip, EHL/TA/GSC intact, sensation intact light touch in sural/saphenous/deep radial/superficial peroneal/tibial nerve distributions, foot warm well-perfused  Imaging: X-rays of the right femur taken 05/22/2024 were independently reviewed and interpreted, showing a well reduced intertrochanteric femur fracture.  There is a cephalomedullary rod in place.  There is no lucency around the interlocking or lag screws.  Alignment appears maintained since immediate postoperative films.  No new fracture seen.   Patient name: Tiffany Black Patient MRN: [id number] Date of visit: 05/22/24

## 2024-06-04 DIAGNOSIS — F419 Anxiety disorder, unspecified: Secondary | ICD-10-CM | POA: Diagnosis not present

## 2024-06-04 DIAGNOSIS — I10 Essential (primary) hypertension: Secondary | ICD-10-CM | POA: Diagnosis not present

## 2024-06-25 ENCOUNTER — Encounter: Admitting: Orthopedic Surgery

## 2024-06-30 ENCOUNTER — Other Ambulatory Visit

## 2024-06-30 ENCOUNTER — Ambulatory Visit: Admitting: Orthopedic Surgery

## 2024-06-30 DIAGNOSIS — S72141A Displaced intertrochanteric fracture of right femur, initial encounter for closed fracture: Secondary | ICD-10-CM

## 2024-06-30 NOTE — Progress Notes (Signed)
 Orthopedic Surgery Post-operative Office Visit   Procedure: right intertrochanteric femur fracture CMN Date of Surgery: 05/05/2024 (~6 weeks post-op)   Assessment: Patient is a 89 y.o. who is has been improvement in her pain since surgery but is still slowly recovering and not making as much progress towards ambulation as would be expected at this point     Plan: -Operative plans complete -Okay to submerge wounds -DVT ppx: None -Weightbearing as tolerated -Continue to work with physical therapy, encouraged her to work towards ambulation and explained that some pain with weightbearing is normal and should get better over time like her postsurgical incisional pain did -Pain management: tylenol  as needed -Return to office in 6 weeks, x-rays needed at next visit: AP/lateral right hip   ___________________________________________________________________________     Subjective: Patient has had decreasing amounts of pain since she was last in the office.  She has been doing slightly more with the therapist.  She has not gotten back to ambulating though.  She is requiring significant assistance to even get up from a sitting position.  Her son states that it is difficult to get her to participate more as she stops when pain comes on.  She also has difficulty remembering conversations.  He has encouraged her to work through the pain but she will not remember that the next day.  There is been no redness or drainage around her incisions.   Objective:   General: no acute distress, appropriate affect Neurologic: alert, answering questions appropriately, following commands Respiratory: unlabored breathing on room air Skin: incisions are well healed with no erythema, induration, active/expressible drainage   MSK (RLE): No pain through passive range of motion of the hip, EHL/TA/GSC intact, sensation intact light touch in sural/saphenous/deep radial/superficial peroneal/tibial nerve distributions, foot  warm well-perfused   Imaging: XRs of the right hip from 06/30/2024 were independent reviewed and interpreted, showing a well reduced peritrochanteric femur fracture.  Alignment is unchanged from prior films on 05/22/2024.  There is an intramedullary rod in place.  No lucency seen around the lag or interlocking screws.  No new fracture seen.     Patient name: Tiffany Black Patient MRN: [id number] Date of visit: 06/30/24

## 2024-07-14 ENCOUNTER — Encounter: Payer: Self-pay | Admitting: Radiology

## 2024-08-11 ENCOUNTER — Ambulatory Visit: Admitting: Orthopedic Surgery

## 2024-08-11 ENCOUNTER — Other Ambulatory Visit

## 2024-08-11 DIAGNOSIS — S72001A Fracture of unspecified part of neck of right femur, initial encounter for closed fracture: Secondary | ICD-10-CM

## 2024-08-11 DIAGNOSIS — S72141A Displaced intertrochanteric fracture of right femur, initial encounter for closed fracture: Secondary | ICD-10-CM

## 2024-08-11 NOTE — Progress Notes (Signed)
 Orthopedic Surgery Post-operative Office Visit   Procedure: right intertrochanteric femur fracture CMN Date of Surgery: 05/05/2024 (~3 months post-op)   Assessment: Patient is a 89 y.o. who is not having any pain in the hip at this point but has been only ambulating short distances     Plan: -Operative plans complete -Weightbearing as tolerated -Continue to work with PT, encourage ambulation -Pain management: tylenol  as needed -Return to office in 3 months, x-rays needed at next visit: AP/lateral right hip   ___________________________________________________________________________     Subjective: Patient continues to reside in a skilled nursing facility.  She is working with PT.  Her son says that it is difficult sometimes to get her to work with them.  She is walking with PT at times with a walker.  She is not having any hip pain.  Her son says that she has not been complaining of any pain in the hip for a while.  She is not taking any medication regularly for pain.   Objective:   General: no acute distress, appropriate affect Neurologic: alert, answering questions appropriately, following commands Respiratory: unlabored breathing on room air Skin: incisions are well healed   MSK (RLE): No pain through passive range of motion of the hip, EHL/TA/GSC intact, sensation intact light touch in sural/saphenous/deep radial/superficial peroneal/tibial nerve distributions, foot warm well-perfused   Imaging: XRs of the right hip from 08/11/2024 were independently reviewed and interpreted, showing a well reduced peritrochanteric femur fracture.  No change in alignment since last films on 06/30/2024.  No lucency seen around the rod or the integrated screws.  No new fracture seen.  No dislocation seen.     Patient name: Tiffany Black Patient MRN: [id number] Date of visit: 08/11/24

## 2024-09-22 ENCOUNTER — Emergency Department (HOSPITAL_COMMUNITY)

## 2024-09-22 ENCOUNTER — Other Ambulatory Visit: Payer: Self-pay

## 2024-09-22 ENCOUNTER — Inpatient Hospital Stay (HOSPITAL_COMMUNITY)
Admission: EM | Admit: 2024-09-22 | Discharge: 2024-09-24 | DRG: 689 | Disposition: A | Discharge status: Skilled Nursing Facility | Source: Skilled Nursing Facility | Attending: Internal Medicine | Admitting: Internal Medicine

## 2024-09-22 DIAGNOSIS — I639 Cerebral infarction, unspecified: Secondary | ICD-10-CM | POA: Diagnosis present

## 2024-09-22 DIAGNOSIS — S0083XA Contusion of other part of head, initial encounter: Secondary | ICD-10-CM | POA: Diagnosis present

## 2024-09-22 DIAGNOSIS — Z888 Allergy status to other drugs, medicaments and biological substances status: Secondary | ICD-10-CM

## 2024-09-22 DIAGNOSIS — Z882 Allergy status to sulfonamides status: Secondary | ICD-10-CM

## 2024-09-22 DIAGNOSIS — M5030 Other cervical disc degeneration, unspecified cervical region: Secondary | ICD-10-CM | POA: Diagnosis present

## 2024-09-22 DIAGNOSIS — F411 Generalized anxiety disorder: Secondary | ICD-10-CM | POA: Diagnosis present

## 2024-09-22 DIAGNOSIS — E876 Hypokalemia: Secondary | ICD-10-CM | POA: Diagnosis present

## 2024-09-22 DIAGNOSIS — Z90710 Acquired absence of both cervix and uterus: Secondary | ICD-10-CM

## 2024-09-22 DIAGNOSIS — I10 Essential (primary) hypertension: Secondary | ICD-10-CM | POA: Diagnosis present

## 2024-09-22 DIAGNOSIS — W19XXXA Unspecified fall, initial encounter: Secondary | ICD-10-CM | POA: Diagnosis not present

## 2024-09-22 DIAGNOSIS — Z79899 Other long term (current) drug therapy: Secondary | ICD-10-CM

## 2024-09-22 DIAGNOSIS — D631 Anemia in chronic kidney disease: Secondary | ICD-10-CM | POA: Diagnosis present

## 2024-09-22 DIAGNOSIS — N1832 Chronic kidney disease, stage 3b: Secondary | ICD-10-CM | POA: Diagnosis present

## 2024-09-22 DIAGNOSIS — I4819 Other persistent atrial fibrillation: Secondary | ICD-10-CM | POA: Diagnosis present

## 2024-09-22 DIAGNOSIS — Z881 Allergy status to other antibiotic agents status: Secondary | ICD-10-CM | POA: Diagnosis not present

## 2024-09-22 DIAGNOSIS — K589 Irritable bowel syndrome without diarrhea: Secondary | ICD-10-CM | POA: Diagnosis present

## 2024-09-22 DIAGNOSIS — Z7189 Other specified counseling: Secondary | ICD-10-CM

## 2024-09-22 DIAGNOSIS — N179 Acute kidney failure, unspecified: Secondary | ICD-10-CM | POA: Diagnosis present

## 2024-09-22 DIAGNOSIS — Y92009 Unspecified place in unspecified non-institutional (private) residence as the place of occurrence of the external cause: Secondary | ICD-10-CM | POA: Diagnosis not present

## 2024-09-22 DIAGNOSIS — Z8542 Personal history of malignant neoplasm of other parts of uterus: Secondary | ICD-10-CM

## 2024-09-22 DIAGNOSIS — Z8673 Personal history of transient ischemic attack (TIA), and cerebral infarction without residual deficits: Secondary | ICD-10-CM | POA: Diagnosis not present

## 2024-09-22 DIAGNOSIS — F0390 Unspecified dementia without behavioral disturbance: Secondary | ICD-10-CM | POA: Diagnosis not present

## 2024-09-22 DIAGNOSIS — S0081XA Abrasion of other part of head, initial encounter: Secondary | ICD-10-CM | POA: Diagnosis present

## 2024-09-22 DIAGNOSIS — Z91041 Radiographic dye allergy status: Secondary | ICD-10-CM

## 2024-09-22 DIAGNOSIS — D638 Anemia in other chronic diseases classified elsewhere: Secondary | ICD-10-CM | POA: Diagnosis present

## 2024-09-22 DIAGNOSIS — Z8249 Family history of ischemic heart disease and other diseases of the circulatory system: Secondary | ICD-10-CM | POA: Diagnosis not present

## 2024-09-22 DIAGNOSIS — Z880 Allergy status to penicillin: Secondary | ICD-10-CM

## 2024-09-22 DIAGNOSIS — E785 Hyperlipidemia, unspecified: Secondary | ICD-10-CM | POA: Diagnosis present

## 2024-09-22 DIAGNOSIS — S20319A Abrasion of unspecified front wall of thorax, initial encounter: Secondary | ICD-10-CM | POA: Diagnosis present

## 2024-09-22 DIAGNOSIS — R4182 Altered mental status, unspecified: Secondary | ICD-10-CM | POA: Diagnosis present

## 2024-09-22 DIAGNOSIS — I4891 Unspecified atrial fibrillation: Secondary | ICD-10-CM | POA: Diagnosis present

## 2024-09-22 DIAGNOSIS — Y92129 Unspecified place in nursing home as the place of occurrence of the external cause: Secondary | ICD-10-CM | POA: Diagnosis not present

## 2024-09-22 DIAGNOSIS — G319 Degenerative disease of nervous system, unspecified: Secondary | ICD-10-CM | POA: Diagnosis not present

## 2024-09-22 DIAGNOSIS — I739 Peripheral vascular disease, unspecified: Secondary | ICD-10-CM | POA: Diagnosis not present

## 2024-09-22 DIAGNOSIS — G8929 Other chronic pain: Secondary | ICD-10-CM | POA: Diagnosis present

## 2024-09-22 DIAGNOSIS — I129 Hypertensive chronic kidney disease with stage 1 through stage 4 chronic kidney disease, or unspecified chronic kidney disease: Secondary | ICD-10-CM | POA: Diagnosis present

## 2024-09-22 DIAGNOSIS — F03C4 Unspecified dementia, severe, with anxiety: Secondary | ICD-10-CM | POA: Diagnosis present

## 2024-09-22 DIAGNOSIS — Z7982 Long term (current) use of aspirin: Secondary | ICD-10-CM | POA: Diagnosis not present

## 2024-09-22 DIAGNOSIS — R296 Repeated falls: Secondary | ICD-10-CM | POA: Diagnosis present

## 2024-09-22 DIAGNOSIS — R400 Somnolence: Secondary | ICD-10-CM

## 2024-09-22 DIAGNOSIS — M159 Polyosteoarthritis, unspecified: Secondary | ICD-10-CM | POA: Diagnosis present

## 2024-09-22 DIAGNOSIS — Z91040 Latex allergy status: Secondary | ICD-10-CM | POA: Diagnosis not present

## 2024-09-22 DIAGNOSIS — I480 Paroxysmal atrial fibrillation: Secondary | ICD-10-CM | POA: Diagnosis not present

## 2024-09-22 DIAGNOSIS — N390 Urinary tract infection, site not specified: Secondary | ICD-10-CM | POA: Diagnosis present

## 2024-09-22 DIAGNOSIS — Z823 Family history of stroke: Secondary | ICD-10-CM | POA: Diagnosis not present

## 2024-09-22 DIAGNOSIS — G9341 Metabolic encephalopathy: Secondary | ICD-10-CM | POA: Diagnosis present

## 2024-09-22 DIAGNOSIS — W1830XA Fall on same level, unspecified, initial encounter: Secondary | ICD-10-CM | POA: Diagnosis present

## 2024-09-22 DIAGNOSIS — Z91048 Other nonmedicinal substance allergy status: Secondary | ICD-10-CM

## 2024-09-22 DIAGNOSIS — I429 Cardiomyopathy, unspecified: Secondary | ICD-10-CM | POA: Diagnosis present

## 2024-09-22 DIAGNOSIS — Z515 Encounter for palliative care: Secondary | ICD-10-CM | POA: Diagnosis not present

## 2024-09-22 DIAGNOSIS — Z885 Allergy status to narcotic agent status: Secondary | ICD-10-CM

## 2024-09-22 LAB — URINALYSIS, ROUTINE W REFLEX MICROSCOPIC
Bilirubin Urine: NEGATIVE
Glucose, UA: NEGATIVE mg/dL
Hgb urine dipstick: NEGATIVE
Ketones, ur: NEGATIVE mg/dL
Nitrite: NEGATIVE
Protein, ur: 100 mg/dL — AB
Specific Gravity, Urine: 1.014 (ref 1.005–1.030)
WBC, UA: >50 WBC/hpf (ref 0–5)
pH: 6 (ref 5.0–8.0)

## 2024-09-22 LAB — COMPREHENSIVE METABOLIC PANEL WITH GFR
ALT: 7 U/L (ref 0–44)
AST: 27 U/L (ref 15–41)
Albumin: 3.7 g/dL (ref 3.5–5.0)
Alkaline Phosphatase: 78 U/L (ref 38–126)
Anion gap: 12 (ref 5–15)
BUN: 10 mg/dL (ref 8–23)
CO2: 27 mmol/L (ref 22–32)
Calcium: 9.7 mg/dL (ref 8.9–10.3)
Chloride: 99 mmol/L (ref 98–111)
Creatinine, Ser: 1.01 mg/dL — ABNORMAL HIGH (ref 0.44–1.00)
GFR, Estimated: 53 mL/min — ABNORMAL LOW
Glucose, Bld: 155 mg/dL — ABNORMAL HIGH (ref 70–99)
Potassium: 3.5 mmol/L (ref 3.5–5.1)
Sodium: 138 mmol/L (ref 135–145)
Total Bilirubin: 0.7 mg/dL (ref 0.0–1.2)
Total Protein: 6.7 g/dL (ref 6.5–8.1)

## 2024-09-22 LAB — I-STAT CHEM 8, ED
BUN: 12 mg/dL (ref 8–23)
Calcium, Ion: 1.14 mmol/L — ABNORMAL LOW (ref 1.15–1.40)
Chloride: 97 mmol/L — ABNORMAL LOW (ref 98–111)
Creatinine, Ser: 1 mg/dL (ref 0.44–1.00)
Glucose, Bld: 142 mg/dL — ABNORMAL HIGH (ref 70–99)
HCT: 43 % (ref 36.0–46.0)
Hemoglobin: 14.6 g/dL (ref 12.0–15.0)
Potassium: 3.4 mmol/L — ABNORMAL LOW (ref 3.5–5.1)
Sodium: 139 mmol/L (ref 135–145)
TCO2: 27 mmol/L (ref 22–32)

## 2024-09-22 LAB — CBC
HCT: 42.1 % (ref 36.0–46.0)
Hemoglobin: 13.8 g/dL (ref 12.0–15.0)
MCH: 29.8 pg (ref 26.0–34.0)
MCHC: 32.8 g/dL (ref 30.0–36.0)
MCV: 90.9 fL (ref 80.0–100.0)
Platelets: 217 K/uL (ref 150–400)
RBC: 4.63 MIL/uL (ref 3.87–5.11)
RDW: 13.9 % (ref 11.5–15.5)
WBC: 7.3 K/uL (ref 4.0–10.5)
nRBC: 0 % (ref 0.0–0.2)

## 2024-09-22 LAB — PROTIME-INR
INR: 1 (ref 0.8–1.2)
Prothrombin Time: 13.5 s (ref 11.4–15.2)

## 2024-09-22 LAB — ETHANOL: Alcohol, Ethyl (B): <15 mg/dL

## 2024-09-22 LAB — SAMPLE TO BLOOD BANK

## 2024-09-22 LAB — I-STAT CG4 LACTIC ACID, ED: Lactic Acid, Venous: 2 mmol/L (ref 0.5–1.9)

## 2024-09-22 MED ORDER — TRAZODONE HCL 50 MG PO TABS: 25.0000 mg | ORAL_TABLET | Freq: Every evening | ORAL | Status: DC | PRN

## 2024-09-22 MED ORDER — BISACODYL 5 MG PO TBEC: 5.0000 mg | DELAYED_RELEASE_TABLET | Freq: Every day | ORAL | Status: DC | PRN

## 2024-09-22 MED ORDER — SENNOSIDES-DOCUSATE SODIUM 8.6-50 MG PO TABS
1.0000 | ORAL_TABLET | Freq: Every day | ORAL | Status: DC
  Administered 2024-09-22 – 2024-09-23 (×2): 1 via ORAL
  Filled 2024-09-22 (×2): qty 1

## 2024-09-22 MED ORDER — FLUOXETINE HCL 20 MG PO CAPS
20.0000 mg | ORAL_CAPSULE | Freq: Every day | ORAL | Status: DC
  Administered 2024-09-23 – 2024-09-24 (×2): 20 mg via ORAL
  Filled 2024-09-22 (×2): qty 1

## 2024-09-22 MED ORDER — FLEET ENEMA RE ENEM: 1.0000 | ENEMA | Freq: Once | RECTAL | Status: DC | PRN

## 2024-09-22 MED ORDER — BUSPIRONE HCL 5 MG PO TABS
7.5000 mg | ORAL_TABLET | Freq: Three times a day (TID) | ORAL | Status: DC
  Administered 2024-09-22 – 2024-09-24 (×5): 7.5 mg via ORAL
  Filled 2024-09-22 (×7): qty 1
  Filled 2024-09-22 (×2): qty 2

## 2024-09-22 MED ORDER — SODIUM CHLORIDE 0.9 % IV SOLN: INTRAVENOUS | Status: DC

## 2024-09-22 MED ORDER — ONDANSETRON HCL 4 MG/2ML IJ SOLN
INTRAMUSCULAR | Status: AC
  Administered 2024-09-22: 4 mg via INTRAVENOUS
  Filled 2024-09-22: qty 2

## 2024-09-22 MED ORDER — ONDANSETRON HCL 4 MG/2ML IJ SOLN: 4.0000 mg | Freq: Four times a day (QID) | INTRAMUSCULAR | Status: DC | PRN

## 2024-09-22 MED ORDER — LORAZEPAM 1 MG PO TABS
0.5000 mg | ORAL_TABLET | Freq: Two times a day (BID) | ORAL | Status: DC
  Administered 2024-09-22: 0.5 mg via ORAL
  Filled 2024-09-22 (×2): qty 1

## 2024-09-22 MED ORDER — SENNOSIDES-DOCUSATE SODIUM 8.6-50 MG PO TABS: 1.0000 | ORAL_TABLET | Freq: Every evening | ORAL | Status: DC | PRN

## 2024-09-22 MED ORDER — ONDANSETRON HCL 4 MG PO TABS: 4.0000 mg | ORAL_TABLET | Freq: Four times a day (QID) | ORAL | Status: DC | PRN

## 2024-09-22 MED ORDER — AMLODIPINE BESYLATE 5 MG PO TABS
10.0000 mg | ORAL_TABLET | Freq: Every day | ORAL | Status: DC
  Administered 2024-09-23 – 2024-09-24 (×2): 10 mg via ORAL
  Filled 2024-09-22: qty 1
  Filled 2024-09-22: qty 2

## 2024-09-22 MED ORDER — SODIUM CHLORIDE 0.9% FLUSH: 3.0000 mL | Freq: Two times a day (BID) | INTRAVENOUS | Status: DC

## 2024-09-22 MED ORDER — METOPROLOL TARTRATE 25 MG PO TABS
25.0000 mg | ORAL_TABLET | Freq: Two times a day (BID) | ORAL | Status: DC
  Administered 2024-09-22 – 2024-09-24 (×4): 25 mg via ORAL
  Filled 2024-09-22 (×4): qty 1

## 2024-09-22 MED ORDER — IPRATROPIUM BROMIDE 0.02 % IN SOLN: 0.5000 mg | Freq: Four times a day (QID) | RESPIRATORY_TRACT | Status: DC | PRN

## 2024-09-22 MED ORDER — HYDRALAZINE HCL 20 MG/ML IJ SOLN: 10.0000 mg | INTRAMUSCULAR | Status: DC | PRN

## 2024-09-22 MED ORDER — ADULT MULTIVITAMIN W/MINERALS CH
1.0000 | ORAL_TABLET | Freq: Every day | ORAL | Status: DC
  Administered 2024-09-23 – 2024-09-24 (×2): 1 via ORAL
  Filled 2024-09-22 (×3): qty 1

## 2024-09-22 MED ORDER — ASPIRIN 81 MG PO CHEW
81.0000 mg | CHEWABLE_TABLET | Freq: Every morning | ORAL | Status: DC
  Administered 2024-09-23 – 2024-09-24 (×2): 81 mg via ORAL
  Filled 2024-09-22 (×2): qty 1

## 2024-09-22 MED ORDER — MELATONIN 5 MG PO TABS
5.0000 mg | ORAL_TABLET | Freq: Every day | ORAL | Status: DC
  Administered 2024-09-22 – 2024-09-23 (×2): 5 mg via ORAL
  Filled 2024-09-22 (×2): qty 1

## 2024-09-22 MED ORDER — OXYCODONE HCL 5 MG PO TABS
5.0000 mg | ORAL_TABLET | ORAL | Status: DC | PRN
  Administered 2024-09-23 – 2024-09-24 (×2): 5 mg via ORAL
  Filled 2024-09-22 (×2): qty 1

## 2024-09-22 MED ORDER — HEPARIN SODIUM (PORCINE) 5000 UNIT/ML IJ SOLN: 5000.0000 [IU] | Freq: Three times a day (TID) | INTRAMUSCULAR | Status: DC

## 2024-09-22 MED ORDER — SODIUM CHLORIDE 0.9% FLUSH
3.0000 mL | Freq: Two times a day (BID) | INTRAVENOUS | Status: DC
  Administered 2024-09-22 – 2024-09-24 (×4): 3 mL via INTRAVENOUS

## 2024-09-22 MED ORDER — DIVALPROEX SODIUM 125 MG PO CSDR
125.0000 mg | DELAYED_RELEASE_CAPSULE | Freq: Every day | ORAL | Status: DC
  Administered 2024-09-23 – 2024-09-24 (×2): 125 mg via ORAL
  Filled 2024-09-22 (×2): qty 1

## 2024-09-22 MED ORDER — POLYETHYLENE GLYCOL 3350 17 G PO PACK
17.0000 g | PACK | Freq: Every day | ORAL | Status: DC
  Administered 2024-09-24: 17 g via ORAL
  Filled 2024-09-22 (×2): qty 1

## 2024-09-22 NOTE — ED Notes (Signed)
 Pt left forehead cleaned with hydrogen peroxide, warm water , and gauze pads  Pt in no distress and handled cleaning well.

## 2024-09-22 NOTE — H&P (Signed)
 " History and Physical   Patient: Tiffany Black                            PCP: [name], MD                    DOB: May 30, 1934            DOA: 09/22/2024 FMW:[id number]             DOS: 09/22/2024, 4:21 PM  [name], MD  Patient coming from:   HOME  I have personally reviewed patient's medical records, in electronic medical records, including:  [facility], and care everywhere.    Chief Complaint:   Chief Complaint  Patient presents with   Trauma    Fall     History of present illness:    Tiffany Black is a 89 year old female with advanced Dementia, Anxiety, Falls, P-A-fib (not anticoagulant), BPV, cardiomyopathy, chronic back pain, CVAs (right temporal/left frontal lobe infarct), HTN/HLD, IBS, insomnia, OA..... Full code Presenting from facility status post fall, confusion.  Per record apparently patient had a witnessed fall at the nursing home facility where she hit her head.  No report of loss of consciousness.  Subsequently became increasingly lethargic, confused and agitated.  Not chronically anticoagulated, with exception of aspirin .    ED evaluation: Blood pressure 116/82, pulse 69, temperature 98.1 F (36.7 C), temperature source Axillary, resp. rate 17, SpO2 100%. Labs: Reviewed within normal limits with exception of potassium 3.4, chloride 97, lactic acid 2.0 UA --pending  Patient is status post EDP and trauma team evaluation.  GCS of 11,-hematoma and abrasion on the left side of her forehead was noted skin tear on the chest wall.  Imaging:  including CT of the head, chest abdomen and pelvic, cervical spine pelvic x-ray were all reviewed in detail--fractures, or dislocations were identified, chronic osteopenia, no acute traumatic injuries to chest abdominal pelvis. No acute intracranial abnormalities, left lateral forehead hematoma, moderate cerebral atrophy, with severe chronic small vessel disease  Requested for patient to be  admitted for observation posttraumatic fall monitoring for acute confusion, agitation.     Patient Denies having: Fever, Chills, Cough, SOB, Chest Pain, , N/V/D, headache, dizziness, lightheadedness,  Dysuria, Joint pain, rash, open wounds   Review of Systems: As per HPI, otherwise 10 point review of systems were negative.   ----------------------------------------------------------------------------------------------------------------------  Allergies[1]  Home MEDs:  Prior to Admission medications  Medication Sig Start Date End Date Taking? Authorizing Provider  amLODipine  (NORVASC ) 10 MG tablet Take 1 tablet (10 mg total) by mouth daily. 02/18/23  Yes Rai, Ripudeep K, MD  aspirin  81 MG chewable tablet Chew 81 mg by mouth in the morning.   Yes [provider]  bisacodyl  (DULCOLAX) 10 MG suppository Place 10 mg rectally daily as needed for moderate constipation.   Yes [provider]  busPIRone  (BUSPAR ) 7.5 MG tablet Take 7.5 mg by mouth 3 (three) times daily. 01/04/24  Yes [provider]  DEPAKOTE  SPRINKLES 125 MG capsule Take 125 mg by mouth daily. 11/19/23  Yes [provider]  FLUoxetine  (PROZAC ) 20 MG capsule Take 20 mg by mouth daily.   Yes [provider]  GERI-TUSSIN 100 MG/5ML liquid Take 10 mLs by mouth every 6 (six) hours as needed for cough. 01/08/24  Yes [provider]  LORazepam  (ATIVAN ) 0.5 MG tablet Take 1 tablet (0.5 mg total)  by mouth daily at 12 noon. Give one tablet by mouth daily in the afternoon for agitation. Patient taking differently: Take 0.5 mg by mouth 2 (two) times daily. 05/11/24  Yes Amin, Ankit C, MD  magnesium  hydroxide (MILK OF MAGNESIA) 400 MG/5ML suspension Take 30 mLs by mouth daily as needed for mild constipation.   Yes [provider]  metoprolol  tartrate (LOPRESSOR ) 25 MG tablet Take 25 mg by mouth 2 (two) times daily. 01/07/24  Yes [provider]  Multiple Vitamins-Minerals  (MULTIVITAMIN WITH MINERALS) tablet Take 1 tablet by mouth daily. Decubi-Vite Oral Capsule   Yes [provider]  polyethylene glycol powder (GLYCOLAX /MIRALAX ) 17 GM/SCOOP powder Take 17 g by mouth daily.   Yes [provider]  Sodium Phosphates (ENEMA RE) Place 1 Dose rectally daily as needed (constipation not relieved by dulcolax suppository).   Yes [provider]  UNABLE TO FIND Take 120 mLs by mouth in the morning and at bedtime. Med Name: med pass   Yes [provider]    PRN MEDs: hydrALAZINE , ipratropium, ondansetron  **OR** ondansetron  (ZOFRAN ) IV, oxyCODONE   Past Medical History:  Diagnosis Date   Anxiety    Arthritis    BPPV (benign paroxysmal positional vertigo)    Breast lesion    left   Bulging of cervical intervertebral disc    Cardiomegaly    Cataracts, bilateral    Chronic neck pain    CVA (cerebral vascular accident) (HCC)    x2 2013 and 2015; right temporal lobe and left frontal lobe infarct-> dzziness which resolved; occluded right PCA, moderate posterior left cerebral stenosis, left MCA stenosis on MRA   Dementia (HCC)    Essential hypertension    History of endometrial cancer    History of stroke    2015 and 2016 // carotid US  8/16: Bilateral 1-39 // carotid US  2/16: Bilateral ICA 1-39   HLD (hyperlipidemia)    Hx of echocardiogram    a. Echo (11/15): Mild LVH, EF 55-60%, normal wall motion, grade 2 diastolic dysfunction, mild LAE, normal RV function, PASP 29 mm Hg // echo 8/16:Mild concentric LVH, EF 55-60, normal wall motion, grade 2 diastolic dysfunction, mild LAE, PASP 39   Hypertension    IBS (irritable bowel syndrome)    Insomnia    Lung nodule    OA (osteoarthritis)    Pectus excavatum    Persistent atrial fibrillation (HCC)    Pleural effusion    right recurrent, 1/24 s/p thoracentesis x 3 (cytology negative)- Dr Kara   Pneumonia due to COVID-19 virus    Radius fracture    left distal   Vertigo    Vitamin D  deficiency     Past Surgical History:  Procedure Laterality Date   ABDOMINAL HYSTERECTOMY  2011   CATARACT EXTRACTION, BILATERAL Bilateral 2009   CESAREAN SECTION  1975   INTRAMEDULLARY (IM) NAIL INTERTROCHANTERIC Right 05/05/2024   Procedure: FIXATION, FRACTURE, INTERTROCHANTERIC, WITH INTRAMEDULLARY ROD;  Surgeon: Georgina Ozell LABOR, MD;  Location: [facility] OR;  Service: Orthopedics;  Laterality: Right;   IR THORACENTESIS RIGHT ASP PLEURAL SPACE W/IMG GUIDE  09/28/2022   IR THORACENTESIS RIGHT ASP PLEURAL SPACE W/IMG GUIDE  10/23/2022   LAPAROSCOPIC HYSTERECTOMY  2011   ORIF WRIST FRACTURE Right 05/10/2024   Procedure: OPEN REDUCTION INTERNAL FIXATION (ORIF) WRIST FRACTURE;  Surgeon: Romona Harari, MD;  Location: [facility] OR;  Service: Orthopedics;  Laterality: Right;   THORACENTESIS Right 11/03/2022   Procedure: THORACENTESIS;  Surgeon: Gladis Leonor HERO, MD;  Location:  [facility];  Service: Pulmonary;  Laterality: Right;     reports that she has never smoked. She has never used smokeless tobacco. She reports that she does not drink alcohol  and does not use drugs.   Family History  Problem Relation Age of Onset   Stroke Mother    Heart attack Father    Heart disease Father    Dementia Sister    CAD Brother    Stroke Maternal Uncle    Hypertension Neg Hx     Physical Exam:   Vitals:   09/22/24 1420 09/22/24 1424  BP: 116/82 116/82  Pulse:  69  Resp:  17  Temp:  98.1 F (36.7 C)  TempSrc:  Axillary  SpO2:  100%   Constitutional: NAD, calm, comfortable awake alert oriented x 1 Eyes: PERRL, lids and conjunctivae normal ENMT: Mucous membranes are moist. Posterior pharynx clear of any exudate or lesions.Normal dentition.  Neck: normal, supple, no masses, no thyromegaly Respiratory: clear to auscultation bilaterally, no wheezing, no crackles. Normal respiratory effort. No accessory muscle use.  Cardiovascular: Regular rate and rhythm, no murmurs / rubs / gallops. No extremity edema.  2+ pedal pulses. No carotid bruits.  Abdomen: no tenderness, no masses palpated. No hepatosplenomegaly. Bowel sounds positive.  Musculoskeletal: no clubbing / cyanosis. No joint deformity upper and lower extremities. Good ROM, no contractures. Normal muscle tone.  Neurologic: Difficult to assess, advanced dementia, only oriented to self CN II-XII grossly intact. Sensation intact, DTR normal. Strength 5/5 in all 4.  Psychiatric: Normal mood only oriented to self Skin: no rashes, lesions, ulcers. No induration  Wounds: Large forehead hematoma, dressing in place, superficial chest wall laceration         Labs on admission:    I have personally reviewed following labs and imaging studies  CBC: Recent Labs  Lab 09/22/24 1424 09/22/24 1426  WBC 7.3  --   HGB 13.8 14.6  HCT 42.1 43.0  MCV 90.9  --   PLT 217  --    Basic Metabolic Panel: Recent Labs  Lab 09/22/24 1424 09/22/24 1426  NA 138 139  K 3.5 3.4*  CL 99 97*  CO2 27  --   GLUCOSE 155* 142*  BUN 10 12  CREATININE 1.01* 1.00  CALCIUM  9.7  --    GFR: CrCl cannot be calculated (Unknown ideal weight.). Liver Function Tests: Recent Labs  Lab 09/22/24 1424  AST 27  ALT 7  ALKPHOS 78  BILITOT 0.7  PROT 6.7  ALBUMIN  3.7   No results for input(s): LIPASE, AMYLASE in the last 168 hours. No results for input(s): AMMONIA in the last 168 hours. Coagulation Profile: Recent Labs  Lab 09/22/24 1424  INR 1.0       Component Value Date/Time   COLORURINE YELLOW 01/16/2024 1020   APPEARANCEUR CLEAR 01/16/2024 1020   LABSPEC 1.012 01/16/2024 1020   PHURINE 5.0 01/16/2024 1020   GLUCOSEU NEGATIVE 01/16/2024 1020   HGBUR NEGATIVE 01/16/2024 1020   BILIRUBINUR NEGATIVE 01/16/2024 1020   KETONESUR NEGATIVE 01/16/2024 1020   PROTEINUR NEGATIVE 01/16/2024 1020   UROBILINOGEN 0.2 05/22/2015 1950   NITRITE NEGATIVE 01/16/2024 1020   LEUKOCYTESUR MODERATE (A) 01/16/2024 1020    Last A1C:  Lab Results   Component Value Date   HGBA1C 5.7 (H) 08/10/2022     Radiologic Exams on Admission:   DG Pelvis Portable Result Date: 09/22/2024 EXAM: 1 or 2 VIEW(S) XRAY OF THE PELVIS 09/22/2024 02:36:00 PM COMPARISON: 05/01/2023 CLINICAL HISTORY: Trauma  FINDINGS: BONES AND JOINTS: Short right femoral intramedullary nail which is intact, without findings of loosening. Heterotopic bone about the right intertrochanteric region. Mild bilateral hip osteoarthritis. Diffuse osteopenia. Bilateral sacroiliac and pubic symphysis degenerative changes. No acute fracture, pelvic bone diastasis, or dislocation. SOFT TISSUES: Unremarkable. IMPRESSION: 1. Osteopenia. No acute fracture, pelvic bone diastasis, or dislocation. Electronically signed by: Rogelia Myers MD MD 09/22/2024 03:02 PM EST RP Workstation: [id number]   CT CERVICAL SPINE WO CONTRAST Result Date: 09/22/2024 EXAM: CT CERVICAL SPINE WITHOUT CONTRAST 09/22/2024 02:57:15 PM TECHNIQUE: CT of the cervical spine was performed without the administration of intravenous contrast. Multiplanar reformatted images are provided for review. Automated exposure control, iterative reconstruction, and/or weight based adjustment of the mA/kV was utilized to reduce the radiation dose to as low as reasonably achievable. COMPARISON: CT cervical spine 01/16/2024. CLINICAL HISTORY: Polytrauma, blunt. Fall. FINDINGS: BONES AND ALIGNMENT: Unchanged alignment with multilevel trace degenerative anterolisthesis. No acute fracture is identified, however mild to moderate motion artifact limits sensitivity for detection of nondisplaced fractures. No destructive process. DEGENERATIVE CHANGES: Overall mild diffuse cervical disc degeneration and asymmetrically advanced left sided cervical facet arthrosis. No evidence of high grade spinal canal stenosis. SOFT TISSUES: No prevertebral soft tissue swelling. These results were communicated to Dr. Sebastian at 2:50 PM on 09/22/2024 by secure text page via  the [facility] messaging system. IMPRESSION: 1. No acute cervical spine fracture or traumatic malalignment; motion artifact limits sensitivity for detection of nondisplaced fractures. Electronically signed by: Dasie Hamburg MD MD 09/22/2024 03:02 PM EST RP Workstation: [id number]   CT CHEST ABDOMEN PELVIS WO CONTRAST Result Date: 09/22/2024 CLINICAL DATA:  Polytrauma, blunt. EXAM: CT CHEST, ABDOMEN AND PELVIS WITHOUT CONTRAST TECHNIQUE: Multidetector CT imaging of the chest, abdomen and pelvis was performed following the standard protocol without IV contrast. RADIATION DOSE REDUCTION: This exam was performed according to the departmental dose-optimization program which includes automated exposure control, adjustment of the mA and/or kV according to patient size and/or use of iterative reconstruction technique. COMPARISON:  CT scan chest from 09/27/2022 and CT scan lumbar spine from 02/17/2021. FINDINGS: CT CHEST FINDINGS Cardiovascular: Mild cardiomegaly. No pericardial effusion. No aortic aneurysm. There are coronary artery calcifications, in keeping with coronary artery disease. There are also mild to moderate peripheral atherosclerotic vascular calcifications of thoracic aorta and its major branches. Mediastinum/Nodes: Visualized thyroid  gland appears grossly unremarkable. No solid / cystic mediastinal masses. The esophagus is nondistended precluding optimal assessment. There are few mildly prominent mediastinal and bilateral axillary lymph nodes, which do not meet the size criteria for lymphadenopathy and appear grossly similar to the prior study, favoring benign etiology. Evaluation of bilateral hila is limited due to lack on intravenous contrast: however, no large hilar lymphadenopathy identified. Lungs/Pleura: The central tracheo-bronchial tree is patent. There is mosaic attenuation of lungs, consistent with heterogeneous air trapping related to small airways disease. There are patchy areas of linear, plate-like  atelectasis and/or scarring throughout bilateral lungs. There is trace right pleural effusion, significantly decreased since the prior study. No left pleural effusion. No mass or consolidation. No pneumothorax. There is a stable sub 4 mm noncalcified nodule in the right lung apex (series 5, image 19). No new or suspicious lung nodule. Musculoskeletal: The visualized soft tissues of the chest wall are grossly unremarkable. No suspicious osseous lesions. There are mild to moderate multilevel degenerative changes in the visualized spine. Multiple old healed right posteromedial rib fractures noted. CT ABDOMEN PELVIS FINDINGS Hepatobiliary: The liver is normal in size. Non-cirrhotic configuration. No suspicious  mass. No intrahepatic or extrahepatic bile duct dilation. No calcified gallstones. Normal gallbladder wall thickness. No pericholecystic inflammatory changes. Pancreas: Unremarkable. No pancreatic ductal dilatation or surrounding inflammatory changes. Spleen: Within normal limits. No focal lesion. Adrenals/Urinary Tract: Adrenal glands are unremarkable. No suspicious renal mass within the limitations of this unenhanced exam. No nephroureterolithiasis or obstructive uropathy. Urinary bladder is under distended, precluding optimal assessment. However, no large mass or stones identified. No perivesical fat stranding. Stomach/Bowel: No disproportionate dilation of the small or large bowel loops. No evidence of abnormal bowel wall thickening or inflammatory changes. The appendix is unremarkable. Vascular/Lymphatic: No ascites or pneumoperitoneum. No abdominal or pelvic lymphadenopathy, by size criteria. No aneurysmal dilation of the major abdominal arteries. There are moderate peripheral atherosclerotic vascular calcifications of the aorta and its major branches. Reproductive: The uterus is surgically absent. No large adnexal mass. Other: There is a tiny fat containing umbilical hernia. The soft tissues and abdominal  wall are otherwise unremarkable. Musculoskeletal: No suspicious osseous lesions. There are mild - moderate multilevel degenerative changes in the visualized spine. Metallic hardware noted in the right proximal femur. IMPRESSION: 1. No acute traumatic injury to the chest, abdomen or pelvis. 2. Otherwise essentially unremarkable exam, as described above. Aortic Atherosclerosis (ICD10-I70.0). Critical Value/emergent results were called by telephone at the time of interpretation on 09/22/2024 at 3:00 pm to Dr. Sebastian, who verbally acknowledged these results. Electronically Signed   By: Ree Molt M.D.   On: 09/22/2024 15:00   DG Chest Port 1 View Result Date: 09/22/2024 EXAM: 1 VIEW XRAY OF THE CHEST 09/22/2024 02:36:00 PM COMPARISON: 05/04/2024 CLINICAL HISTORY: Trauma FINDINGS: LUNGS AND PLEURA: Low lung volumes. Elevation of the right hemidiaphragm. No focal pulmonary opacity. No pleural effusion. No pneumothorax. HEART AND MEDIASTINUM: Tortuous aorta with aortic atherosclerosis. BONES AND SOFT TISSUES: Multilevel degenerative disc disease of the spine. Diffuse osteopenia. IMPRESSION: 1. Low lung volumes. No acute cardiopulmonary abnormality. Electronically signed by: Rogelia Myers MD MD 09/22/2024 02:59 PM EST RP Workstation: [id number]   CT HEAD WO CONTRAST Result Date: 09/22/2024 EXAM: CT HEAD WITHOUT CONTRAST 09/22/2024 02:28:00 PM TECHNIQUE: CT of the head was performed without the administration of intravenous contrast. Automated exposure control, iterative reconstruction, and/or weight based adjustment of the mA/kV was utilized to reduce the radiation dose to as low as reasonably achievable. COMPARISON: Head CT 01/16/2024 and MRI 08/10/2022. CLINICAL HISTORY: Head trauma, moderate-severe. Fall. FINDINGS: BRAIN AND VENTRICLES: There is no evidence of an acute infarct, intracranial hemorrhage, mass, midline shift, hydrocephalus, or extra-axial fluid collection. There is moderate cerebral atrophy.  Confluent hypodensities in the cerebral white matter bilaterally are similar to the prior CT and nonspecific but compatible with severe chronic small vessel ischemic disease. Calcified atherosclerosis at the skull base. ORBITS: Bilateral cataract extraction. SINUSES: Partially visualized chronic left maxillary sinusitis. Clear mastoid air cells. SOFT TISSUES AND SKULL: Left lateral forehead hematoma. No skull fracture. These results were communicated to Dr. Sebastian at 2:50 PM on 09/22/2024 by secure text page via the [facility] messaging system. IMPRESSION: 1. No acute intracranial abnormality. 2. Left lateral forehead hematoma. 3. Moderate cerebral atrophy and severe chronic small vessel ischemic disease. Electronically signed by: Dasie Hamburg MD MD 09/22/2024 02:51 PM EST RP Workstation: [id number]    EKG:   Independently reviewed.  Orders placed or performed during the hospital encounter of 09/22/24   EKG 12-Lead   EKG 12-Lead   ED EKG   ED EKG   EKG 12-Lead   EKG 12-Lead   EKG 12-Lead   ---------------------------------------------------------------------------------------------------------------------------------------  Assessment / Plan:   Principal Problem:   Fall at home, initial encounter Active Problems:   Altered mental status   Falls   Cerebral infarction [facility])   Essential hypertension   Atrial fibrillation with RVR (HCC)   GAD (generalized anxiety disorder)   Acute renal failure superimposed on stage 3b chronic kidney disease (HCC)   Anemia of chronic disease   Hypokalemia   Assessment and Plan: * Fall at home, initial encounter Head hematoma, chest laceration -No other deformities on exam  - Status post evaluation by ED team and trauma team -images reviewed in detail -no dislocations or fractures - Fall precautions   Falls Status posttraumatic fall -Sustaining superficial hematoma and laceration on chest -All images were reviewed in detail acute fractures  or dislocation -Fall precautions -Consulting PT OT for evaluation recommendation  Altered mental status Alterman status from baseline, in the setting of dementia -Post fall -Monitoring closely -continue with neurochecks - Ruling out infection  - Mildly elevated lactic acid, pending UA  Anemia of chronic disease History of anemia of chronic disease -Patient is stable, monitor  Acute renal failure superimposed on stage 3b chronic kidney disease (HCC) BUN/creatinine at baseline, monitor closely -Avoid nephrotoxin and hypotension  Lab Results  Component Value Date   CREATININE 1.00 09/22/2024   CREATININE 1.01 (H) 09/22/2024   CREATININE 1.20 (H) 05/13/2024       GAD (generalized anxiety disorder) Confusion, home medication indicates she -On Ativan  0.5 mg twice a day -Fluoxetine  20 mg daily, BuSpar  7.5 mg daily Will confirm meds, reinitiate cautious  Atrial fibrillation with RVR (HCC) Not chronically anticoagulated secondary to recurrent falls -Continue metoprolol , aspirin  when appropriate  Essential hypertension BP stable -Continue Lopressor , amlodipine ,   Cerebral infarction (HCC) History of previous strokes in the setting of atrial fibrillation -Not anticoagulated due to recurrent falls -Advanced dementia -Stable, continue aspirin  when appropriate              Consults called: Trauma team, PT/OT/social work -------------------------------------------------------------------------------------------------------------------------------------------- DVT prophylaxis:  Place and maintain sequential compression device Start: 09/22/24 1544 Place TED hose Start: 09/22/24 1544 SCDs Start: 09/22/24 1542   Code Status:   Code Status: Full Code   Admission status: Patient will be admitted as Observation, with a greater than 2 midnight length of stay. Level of care: Telemetry   Family Communication:  none at bedside  (The above findings and plan of  care has been discussed with patient in detail, the patient expressed understanding and agreement of above plan)  --------------------------------------------------------------------------------------------------------------------------------------------------  Disposition Plan:  Anticipated 1-2 days Status is: Inpatient Remains inpatient appropriate because: Monitoring closely, for hematoma, change in mental status     ----------------------------------------------------------------------------------------------------------------------------------------------------  Time spent:  51  Min.  Was spent seeing and evaluating the patient, reviewing all medical records, drawn plan of care.  SIGNED: Adriana DELENA Grams, MD, FHM. FAAFP. [place] [facility], Pager  (Please use [email or web address] to page/ or secure chat through epic) If 7PM-7AM, please contact night-coverage [email or web address],  09/22/2024, 4:21 PM     [1]  Allergies Allergen Reactions   Hydrochlorothiazide Hives   Penicillins Anaphylaxis and Hives   Cipro  [Ciprofloxacin  Hcl] Other (See Comments)    blood pressure issues Unknown if medication increased or decreased blood pressure   Iodinated Contrast Media Diarrhea, Other (See Comments) and Hypertension    Elevated the B/P   Iodine -131 Other (See Comments) and Hypertension    Raised the blood pressure   Lasix [Furosemide] Other (See Comments)  Unknown reaction   Latex Itching   Lipitor [Atorvastatin] Other (See Comments)    Myopathy    Lortab [Hydrocodone -Acetaminophen ] Other (See Comments)    Unknown reaction   Niaspan [Niacin] Other (See Comments)    Flu-like symptoms   Statins Other (See Comments)    Myopathy   Toprol  Xl [Metoprolol ] Other (See Comments)    Unknown reaction to metoprolol  succinate only, no reaction to tartrate   Vibramycin [Doxycycline] Diarrhea and Nausea And Vomiting   Bactrim [Sulfamethoxazole-Trimethoprim] Hives and Rash   Codeine Nausea  And Vomiting   Nickel Rash   Sulfa Antibiotics Hives and Rash   Sulfonamide Derivatives Hives and Rash        Tegretol [Carbamazepine] Rash, Other (See Comments) and Hypertension    flushed blood pressure medication out of system   "

## 2024-09-22 NOTE — Assessment & Plan Note (Signed)
 BUN/creatinine at baseline, monitor closely -Avoid nephrotoxin and hypotension  Lab Results  Component Value Date   CREATININE 1.00 09/22/2024   CREATININE 1.01 (H) 09/22/2024   CREATININE 1.20 (H) 05/13/2024

## 2024-09-22 NOTE — Assessment & Plan Note (Signed)
 History of anemia of chronic disease -Patient is stable, monitor

## 2024-09-22 NOTE — Consult Note (Addendum)
 Reason for Consult:fall with MS change Referring Physician: [name] is an 89 y.o. female.  HPI: 89yo F resident of the facility had a witnessed fall in front of staff striking her head.  She was noted to have mental status changes and was brought in as a level 2 trauma, upgraded to level 1 due to low GCS.  On my arrival, she was hemodynamically normal and GCS was E2V4 M6=12.  She was not able to contribute to history.  Reportedly only oriented to self at baseline.  Past Medical History:  Diagnosis Date   Anxiety    Arthritis    BPPV (benign paroxysmal positional vertigo)    Breast lesion    left   Bulging of cervical intervertebral disc    Cardiomegaly    Cataracts, bilateral    Chronic neck pain    CVA (cerebral vascular accident) (HCC)    x2 2013 and 2015; right temporal lobe and left frontal lobe infarct-> dzziness which resolved; occluded right PCA, moderate posterior left cerebral stenosis, left MCA stenosis on MRA   Dementia [facility])    Essential hypertension    History of endometrial cancer    History of stroke    2015 and 2016 // carotid US  8/16: Bilateral 1-39 // carotid US  2/16: Bilateral ICA 1-39   HLD (hyperlipidemia)    Hx of echocardiogram    a. Echo (11/15): Mild LVH, EF 55-60%, normal wall motion, grade 2 diastolic dysfunction, mild LAE, normal RV function, PASP 29 mm Hg // echo 8/16:Mild concentric LVH, EF 55-60, normal wall motion, grade 2 diastolic dysfunction, mild LAE, PASP 39   Hypertension    IBS (irritable bowel syndrome)    Insomnia    Lung nodule    OA (osteoarthritis)    Pectus excavatum    Persistent atrial fibrillation (HCC)    Pleural effusion    right recurrent, 1/24 s/p thoracentesis x 3 (cytology negative)- Dr Kara   Pneumonia due to COVID-19 virus    Radius fracture    left distal   Vertigo    Vitamin D deficiency     Past Surgical History:  Procedure Laterality Date   ABDOMINAL HYSTERECTOMY  2011   CATARACT  EXTRACTION, BILATERAL Bilateral 2009   CESAREAN SECTION  1975   INTRAMEDULLARY (IM) NAIL INTERTROCHANTERIC Right 05/05/2024   Procedure: FIXATION, FRACTURE, INTERTROCHANTERIC, WITH INTRAMEDULLARY ROD;  Surgeon: Georgina Ozell LABOR, MD;  Location: [facility] OR;  Service: Orthopedics;  Laterality: Right;   IR THORACENTESIS RIGHT ASP PLEURAL SPACE W/IMG GUIDE  09/28/2022   IR THORACENTESIS RIGHT ASP PLEURAL SPACE W/IMG GUIDE  10/23/2022   LAPAROSCOPIC HYSTERECTOMY  2011   ORIF WRIST FRACTURE Right 05/10/2024   Procedure: OPEN REDUCTION INTERNAL FIXATION (ORIF) WRIST FRACTURE;  Surgeon: Romona Harari, MD;  Location: [facility] OR;  Service: Orthopedics;  Laterality: Right;   THORACENTESIS Right 11/03/2022   Procedure: THORACENTESIS;  Surgeon: Gladis Leonor HERO, MD;  Location: [facility] ENDOSCOPY;  Service: Pulmonary;  Laterality: Right;    Family History  Problem Relation Age of Onset   Stroke Mother    Heart attack Father    Heart disease Father    Dementia Sister    CAD Brother    Stroke Maternal Uncle    Hypertension Neg Hx     Social History:  reports that she has never smoked. She has never used smokeless tobacco. She reports that she does not drink alcohol  and does not use drugs.  Allergies: Allergies[1]  Medications: I  have reviewed the patient's current medications.  Results for orders placed or performed during the hospital encounter of 09/22/24 (from the past 48 hours)  Comprehensive metabolic panel     Status: Abnormal   Collection Time: 09/22/24  2:24 PM  Result Value Ref Range   Sodium 138 135 - 145 mmol/L   Potassium 3.5 3.5 - 5.1 mmol/L   Chloride 99 98 - 111 mmol/L   CO2 27 22 - 32 mmol/L   Glucose, Bld 155 (H) 70 - 99 mg/dL    Comment: Glucose reference range applies only to samples taken after fasting for at least 8 hours.   BUN 10 8 - 23 mg/dL   Creatinine, Ser 8.98 (H) 0.44 - 1.00 mg/dL   Calcium  9.7 8.9 - 10.3 mg/dL   Total Protein 6.7 6.5 - 8.1 g/dL   Albumin  3.7 3.5 - 5.0 g/dL    AST 27 15 - 41 U/L    Comment: HEMOLYSIS AT THIS LEVEL MAY AFFECT RESULT   ALT 7 0 - 44 U/L   Alkaline Phosphatase 78 38 - 126 U/L   Total Bilirubin 0.7 0.0 - 1.2 mg/dL   GFR, Estimated 53 (L) >60 mL/min    Comment: (NOTE) Calculated using the CKD-EPI Creatinine Equation (2021)    Anion gap 12 5 - 15    Comment: Performed at [facility], [address], [place], [place] [address]  CBC     Status: None   Collection Time: 09/22/24  2:24 PM  Result Value Ref Range   WBC 7.3 4.0 - 10.5 K/uL   RBC 4.63 3.87 - 5.11 MIL/uL   Hemoglobin 13.8 12.0 - 15.0 g/dL   HCT 57.8 63.9 - 53.9 %   MCV 90.9 80.0 - 100.0 fL   MCH 29.8 26.0 - 34.0 pg   MCHC 32.8 30.0 - 36.0 g/dL   RDW 86.0 88.4 - 84.4 %   Platelets 217 150 - 400 K/uL   nRBC 0.0 0.0 - 0.2 %    Comment: Performed at [facility], [address], [place], [place] [address]  Protime-INR     Status: None   Collection Time: 09/22/24  2:24 PM  Result Value Ref Range   Prothrombin Time 13.5 11.4 - 15.2 seconds   INR 1.0 0.8 - 1.2    Comment: (NOTE) INR goal varies based on device and disease states. Performed at [facility], [address], [place], [place] [address]   Sample to Blood Bank     Status: None   Collection Time: 09/22/24  2:24 PM  Result Value Ref Range   Blood Bank Specimen SAMPLE AVAILABLE FOR TESTING    Sample Expiration      09/25/2024,2359 Performed at [facility], [address] [place], [place] [address]   I-Stat CG4 Lactic Acid, ED     Status: Abnormal   Collection Time: 09/22/24  2:26 PM  Result Value Ref Range   Lactic Acid, Venous 2.0 (HH) 0.5 - 1.9 mmol/L  I-stat chem 8, ed     Status: Abnormal   Collection Time: 09/22/24  2:26 PM  Result Value Ref Range   Sodium 139 135 - 145 mmol/L   Potassium 3.4 (L) 3.5 - 5.1 mmol/L   Chloride 97 (L) 98 - 111 mmol/L   BUN 12 8 - 23 mg/dL   Creatinine, Ser 8.99 0.44 - 1.00 mg/dL   Glucose, Bld 857 (H) 70 - 99 mg/dL  Comment: Glucose  reference range applies only to samples taken after fasting for at least 8 hours.   Calcium , Ion 1.14 (L) 1.15 - 1.40 mmol/L   TCO2 27 22 - 32 mmol/L   Hemoglobin 14.6 12.0 - 15.0 g/dL   HCT 56.9 63.9 - 53.9 %    DG Pelvis Portable Result Date: 09/22/2024 EXAM: 1 or 2 VIEW(S) XRAY OF THE PELVIS 09/22/2024 02:36:00 PM COMPARISON: 05/01/2023 CLINICAL HISTORY: Trauma FINDINGS: BONES AND JOINTS: Short right femoral intramedullary nail which is intact, without findings of loosening. Heterotopic bone about the right intertrochanteric region. Mild bilateral hip osteoarthritis. Diffuse osteopenia. Bilateral sacroiliac and pubic symphysis degenerative changes. No acute fracture, pelvic bone diastasis, or dislocation. SOFT TISSUES: Unremarkable. IMPRESSION: 1. Osteopenia. No acute fracture, pelvic bone diastasis, or dislocation. Electronically signed by: Rogelia Myers MD MD 09/22/2024 03:02 PM EST RP Workstation: [id number]   CT CERVICAL SPINE WO CONTRAST Result Date: 09/22/2024 EXAM: CT CERVICAL SPINE WITHOUT CONTRAST 09/22/2024 02:57:15 PM TECHNIQUE: CT of the cervical spine was performed without the administration of intravenous contrast. Multiplanar reformatted images are provided for review. Automated exposure control, iterative reconstruction, and/or weight based adjustment of the mA/kV was utilized to reduce the radiation dose to as low as reasonably achievable. COMPARISON: CT cervical spine 01/16/2024. CLINICAL HISTORY: Polytrauma, blunt. Fall. FINDINGS: BONES AND ALIGNMENT: Unchanged alignment with multilevel trace degenerative anterolisthesis. No acute fracture is identified, however mild to moderate motion artifact limits sensitivity for detection of nondisplaced fractures. No destructive process. DEGENERATIVE CHANGES: Overall mild diffuse cervical disc degeneration and asymmetrically advanced left sided cervical facet arthrosis. No evidence of high grade spinal canal stenosis. SOFT TISSUES: No  prevertebral soft tissue swelling. These results were communicated to Dr. Sebastian at 2:50 PM on 09/22/2024 by secure text page via the [facility] messaging system. IMPRESSION: 1. No acute cervical spine fracture or traumatic malalignment; motion artifact limits sensitivity for detection of nondisplaced fractures. Electronically signed by: Dasie Hamburg MD MD 09/22/2024 03:02 PM EST RP Workstation: [id number]   CT CHEST ABDOMEN PELVIS WO CONTRAST Result Date: 09/22/2024 CLINICAL DATA:  Polytrauma, blunt. EXAM: CT CHEST, ABDOMEN AND PELVIS WITHOUT CONTRAST TECHNIQUE: Multidetector CT imaging of the chest, abdomen and pelvis was performed following the standard protocol without IV contrast. RADIATION DOSE REDUCTION: This exam was performed according to the departmental dose-optimization program which includes automated exposure control, adjustment of the mA and/or kV according to patient size and/or use of iterative reconstruction technique. COMPARISON:  CT scan chest from 09/27/2022 and CT scan lumbar spine from 02/17/2021. FINDINGS: CT CHEST FINDINGS Cardiovascular: Mild cardiomegaly. No pericardial effusion. No aortic aneurysm. There are coronary artery calcifications, in keeping with coronary artery disease. There are also mild to moderate peripheral atherosclerotic vascular calcifications of thoracic aorta and its major branches. Mediastinum/Nodes: Visualized thyroid  gland appears grossly unremarkable. No solid / cystic mediastinal masses. The esophagus is nondistended precluding optimal assessment. There are few mildly prominent mediastinal and bilateral axillary lymph nodes, which do not meet the size criteria for lymphadenopathy and appear grossly similar to the prior study, favoring benign etiology. Evaluation of bilateral hila is limited due to lack on intravenous contrast: however, no large hilar lymphadenopathy identified. Lungs/Pleura: The central tracheo-bronchial tree is patent. There is mosaic attenuation  of lungs, consistent with heterogeneous air trapping related to small airways disease. There are patchy areas of linear, plate-like atelectasis and/or scarring throughout bilateral lungs. There is trace right pleural effusion, significantly decreased since the prior study. No left pleural effusion. No mass or consolidation.  No pneumothorax. There is a stable sub 4 mm noncalcified nodule in the right lung apex (series 5, image 19). No new or suspicious lung nodule. Musculoskeletal: The visualized soft tissues of the chest wall are grossly unremarkable. No suspicious osseous lesions. There are mild to moderate multilevel degenerative changes in the visualized spine. Multiple old healed right posteromedial rib fractures noted. CT ABDOMEN PELVIS FINDINGS Hepatobiliary: The liver is normal in size. Non-cirrhotic configuration. No suspicious mass. No intrahepatic or extrahepatic bile duct dilation. No calcified gallstones. Normal gallbladder wall thickness. No pericholecystic inflammatory changes. Pancreas: Unremarkable. No pancreatic ductal dilatation or surrounding inflammatory changes. Spleen: Within normal limits. No focal lesion. Adrenals/Urinary Tract: Adrenal glands are unremarkable. No suspicious renal mass within the limitations of this unenhanced exam. No nephroureterolithiasis or obstructive uropathy. Urinary bladder is under distended, precluding optimal assessment. However, no large mass or stones identified. No perivesical fat stranding. Stomach/Bowel: No disproportionate dilation of the small or large bowel loops. No evidence of abnormal bowel wall thickening or inflammatory changes. The appendix is unremarkable. Vascular/Lymphatic: No ascites or pneumoperitoneum. No abdominal or pelvic lymphadenopathy, by size criteria. No aneurysmal dilation of the major abdominal arteries. There are moderate peripheral atherosclerotic vascular calcifications of the aorta and its major branches. Reproductive: The uterus  is surgically absent. No large adnexal mass. Other: There is a tiny fat containing umbilical hernia. The soft tissues and abdominal wall are otherwise unremarkable. Musculoskeletal: No suspicious osseous lesions. There are mild - moderate multilevel degenerative changes in the visualized spine. Metallic hardware noted in the right proximal femur. IMPRESSION: 1. No acute traumatic injury to the chest, abdomen or pelvis. 2. Otherwise essentially unremarkable exam, as described above. Aortic Atherosclerosis (ICD10-I70.0). Critical Value/emergent results were called by telephone at the time of interpretation on 09/22/2024 at 3:00 pm to Dr. Sebastian, who verbally acknowledged these results. Electronically Signed   By: Ree Molt M.D.   On: 09/22/2024 15:00   DG Chest Port 1 View Result Date: 09/22/2024 EXAM: 1 VIEW XRAY OF THE CHEST 09/22/2024 02:36:00 PM COMPARISON: 05/04/2024 CLINICAL HISTORY: Trauma FINDINGS: LUNGS AND PLEURA: Low lung volumes. Elevation of the right hemidiaphragm. No focal pulmonary opacity. No pleural effusion. No pneumothorax. HEART AND MEDIASTINUM: Tortuous aorta with aortic atherosclerosis. BONES AND SOFT TISSUES: Multilevel degenerative disc disease of the spine. Diffuse osteopenia. IMPRESSION: 1. Low lung volumes. No acute cardiopulmonary abnormality. Electronically signed by: Rogelia Myers MD MD 09/22/2024 02:59 PM EST RP Workstation: [id number]   CT HEAD WO CONTRAST Result Date: 09/22/2024 EXAM: CT HEAD WITHOUT CONTRAST 09/22/2024 02:28:00 PM TECHNIQUE: CT of the head was performed without the administration of intravenous contrast. Automated exposure control, iterative reconstruction, and/or weight based adjustment of the mA/kV was utilized to reduce the radiation dose to as low as reasonably achievable. COMPARISON: Head CT 01/16/2024 and MRI 08/10/2022. CLINICAL HISTORY: Head trauma, moderate-severe. Fall. FINDINGS: BRAIN AND VENTRICLES: There is no evidence of an acute infarct,  intracranial hemorrhage, mass, midline shift, hydrocephalus, or extra-axial fluid collection. There is moderate cerebral atrophy. Confluent hypodensities in the cerebral white matter bilaterally are similar to the prior CT and nonspecific but compatible with severe chronic small vessel ischemic disease. Calcified atherosclerosis at the skull base. ORBITS: Bilateral cataract extraction. SINUSES: Partially visualized chronic left maxillary sinusitis. Clear mastoid air cells. SOFT TISSUES AND SKULL: Left lateral forehead hematoma. No skull fracture. These results were communicated to Dr. Sebastian at 2:50 PM on 09/22/2024 by secure text page via the [facility] messaging system. IMPRESSION: 1. No acute intracranial abnormality.  2. Left lateral forehead hematoma. 3. Moderate cerebral atrophy and severe chronic small vessel ischemic disease. Electronically signed by: Dasie Hamburg MD MD 09/22/2024 02:51 PM EST RP Workstation: [id number]    Review of Systems  Unable to perform ROS: Mental status change   Blood pressure 116/82, pulse 69, temperature 98.1 F (36.7 C), temperature source Axillary, resp. rate 17, SpO2 100%. Physical Exam Constitutional:      Appearance: She is not diaphoretic.  HENT:     Head:     Comments: 3 cm left forehead abrasion with hematoma    Mouth/Throat:     Mouth: Mucous membranes are dry.  Eyes:     General: No scleral icterus.    Pupils: Pupils are equal, round, and reactive to light.  Cardiovascular:     Rate and Rhythm: Normal rate. Rhythm irregular.  Pulmonary:     Effort: Pulmonary effort is normal.     Breath sounds: Normal breath sounds. No wheezing.  Abdominal:     General: Abdomen is flat.     Palpations: Abdomen is soft.     Tenderness: There is no abdominal tenderness. There is no guarding or rebound.  Musculoskeletal:     Cervical back: No tenderness.  Skin:    General: Skin is dry.  Neurological:     Comments: GCS 13.  Did say her name and follow commands  during exam     Assessment/Plan: Witnessed ground-level fall Forehead abrasion and hematoma Chest wall abrasion  No injuries identified on CT head, C-spine, chest, abdomen, pelvis.  Recommend medical evaluation for admission.  Remove c-collar.  I discussed with Dr. Ellouise.  Please recall trauma as needed. Critical care 42 minutes. Dann FORBES Hummer 09/22/2024, 3:20 PM         [1]  Allergies Allergen Reactions   Hydrochlorothiazide Hives   Penicillins Anaphylaxis and Hives   Cipro  [Ciprofloxacin  Hcl] Other (See Comments)    blood pressure issues Unknown if medication increased or decreased blood pressure   Iodinated Contrast Media Diarrhea, Other (See Comments) and Hypertension    Elevated the B/P   Iodine -131 Other (See Comments) and Hypertension    Raised the blood pressure   Lasix [Furosemide] Other (See Comments)    Unknown reaction   Latex Itching   Lipitor [Atorvastatin] Other (See Comments)    Myopathy    Lortab [Hydrocodone -Acetaminophen ] Other (See Comments)    Unknown reaction   Niaspan [Niacin] Other (See Comments)    Flu-like symptoms   Statins Other (See Comments)    Myopathy   Toprol  Xl [Metoprolol ] Other (See Comments)    Unknown reaction to metoprolol  succinate only, no reaction to tartrate   Vibramycin [Doxycycline] Diarrhea and Nausea And Vomiting   Bactrim [Sulfamethoxazole-Trimethoprim] Hives and Rash   Codeine Nausea And Vomiting   Nickel Rash   Sulfa Antibiotics Hives and Rash   Sulfonamide Derivatives Hives and Rash        Tegretol [Carbamazepine] Rash, Other (See Comments) and Hypertension    flushed blood pressure medication out of system

## 2024-09-22 NOTE — ED Triage Notes (Signed)
 [place] arriving with pt from [facility] in [place]. The patient had a fall which was witnessed, she hit her head, unknown LOC, has been increasingly lethargic since falling - Hematoma above left eyebrow - No anticoags, only aspirin . Baseline AxOx1 hx of dementia. GCS 9 with EMS.    EMS Vitals and interventions: BP 112/80 HR 72 RR 16 O2 94 on RA CBG 104 No meds/IV In C Collar from EMS

## 2024-09-22 NOTE — Assessment & Plan Note (Signed)
 Confusion, home medication indicates she -On Ativan  0.5 mg twice a day -Fluoxetine  20 mg daily, BuSpar  7.5 mg daily Will confirm meds, reinitiate cautious

## 2024-09-22 NOTE — Hospital Course (Addendum)
 Tiffany Black is an 89 year old female with advanced Dementia, Anxiety, Falls, P-A-fib (not anticoagulant), BPV, cardiomyopathy, chronic back pain, CVAs (right temporal/left frontal lobe infarct), HTN/HLD, IBS, insomnia, OA.  She presents from SNF after a fall and confusion.   Per record apparently patient had a witnessed fall at the nursing home facility where she hit her head. No report of loss of consciousness.  Subsequently became increasingly lethargic, confused and agitated.  Not chronically anticoagulated, with exception of aspirin .  CT head negative for acute abnormalities.  Left lateral forehead hematoma appreciated with underlying moderate cerebral atrophy and severe chronic small vessel disease.  She was admitted for monitoring after fall and ongoing lethargy.

## 2024-09-22 NOTE — Assessment & Plan Note (Signed)
 History of previous strokes in the setting of atrial fibrillation -Not anticoagulated due to recurrent falls -Advanced dementia -Stable, continue aspirin 

## 2024-09-22 NOTE — Assessment & Plan Note (Signed)
 [place] status from baseline, in the setting of dementia -Post fall -Monitoring closely -continue with neurochecks - Ruling out infection  - Mildly elevated lactic acid, pending UA

## 2024-09-22 NOTE — ED Notes (Incomplete)
 Trauma Response Nurse Documentation   Tiffany Black is a 89 y.o. female arriving to [facility] ED via EMS  On aspirin  81 mg daily. Trauma was activated as a Level 2 by ED Charge RN based on the following trauma criteria GCS 10-14 associated with trauma or AVPU < A.  Patient cleared for CT by Dr. . Pt transported to {TRN Radiology:26861::CT} with trauma response nurse present to monitor. RN remained with the patient throughout their absence from the department for clinical observation.   GCS ***.  Trauma MD Arrival Time: ***.  History   Past Medical History:  Diagnosis Date   Anxiety    Arthritis    BPPV (benign paroxysmal positional vertigo)    Breast lesion    left   Bulging of cervical intervertebral disc    Cardiomegaly    Cataracts, bilateral    Chronic neck pain    CVA (cerebral vascular accident) (HCC)    x2 2013 and 2015; right temporal lobe and left frontal lobe infarct-> dzziness which resolved; occluded right PCA, moderate posterior left cerebral stenosis, left MCA stenosis on MRA   Dementia [facility])    Essential hypertension    History of endometrial cancer    History of stroke    2015 and 2016 // carotid US  8/16: Bilateral 1-39 // carotid US  2/16: Bilateral ICA 1-39   HLD (hyperlipidemia)    Hx of echocardiogram    a. Echo (11/15): Mild LVH, EF 55-60%, normal wall motion, grade 2 diastolic dysfunction, mild LAE, normal RV function, PASP 29 mm Hg // echo 8/16:Mild concentric LVH, EF 55-60, normal wall motion, grade 2 diastolic dysfunction, mild LAE, PASP 39   Hypertension    IBS (irritable bowel syndrome)    Insomnia    Lung nodule    OA (osteoarthritis)    Pectus excavatum    Persistent atrial fibrillation (HCC)    Pleural effusion    right recurrent, 1/24 s/p thoracentesis x 3 (cytology negative)- Dr Kara   Pneumonia due to COVID-19 virus    Radius fracture    left distal   Vertigo    Vitamin D deficiency      Past Surgical History:  Procedure  Laterality Date   ABDOMINAL HYSTERECTOMY  2011   CATARACT EXTRACTION, BILATERAL Bilateral 2009   CESAREAN SECTION  1975   INTRAMEDULLARY (IM) NAIL INTERTROCHANTERIC Right 05/05/2024   Procedure: FIXATION, FRACTURE, INTERTROCHANTERIC, WITH INTRAMEDULLARY ROD;  Surgeon: Georgina Ozell LABOR, MD;  Location: [facility] OR;  Service: Orthopedics;  Laterality: Right;   IR THORACENTESIS RIGHT ASP PLEURAL SPACE W/IMG GUIDE  09/28/2022   IR THORACENTESIS RIGHT ASP PLEURAL SPACE W/IMG GUIDE  10/23/2022   LAPAROSCOPIC HYSTERECTOMY  2011   ORIF WRIST FRACTURE Right 05/10/2024   Procedure: OPEN REDUCTION INTERNAL FIXATION (ORIF) WRIST FRACTURE;  Surgeon: Romona Harari, MD;  Location: [facility] OR;  Service: Orthopedics;  Laterality: Right;   THORACENTESIS Right 11/03/2022   Procedure: THORACENTESIS;  Surgeon: Gladis Leonor HERO, MD;  Location: [facility] ENDOSCOPY;  Service: Pulmonary;  Laterality: Right;       Initial Focused Assessment (If applicable, or please see trauma documentation):   CT's Completed:   {Trauma CT:26866}   Interventions:   Plan for disposition:  {Trauma Dispo:26867}   Consults completed:  {Trauma Consults:26862} at ***.  Event Summary:  MTP Summary (If applicable):   Bedside handoff with {Trauma handoff:26863::ED RN} ***.    LEBRON ROCKIE ORN  Trauma Response RN  Please call TRN at [phone] for further assistance.

## 2024-09-22 NOTE — ED Provider Notes (Signed)
 " [place] [facility] Provider Note   CSN: [id number] Arrival date & time: 09/22/24  1414     Patient presents with: No chief complaint on file.   Tiffany Black is a 89 y.o. female.   Patient is an 89 year old female with a past medical history of dementia ANO x 1 at baseline, hypertension, A-fib not on [facility], anxiety presenting to the emergency department as a level 1 trauma.  Per EMS the patient had a witnessed fall at her nursing home and hit her head.  They were unsure if she lost consciousness.  She became increasingly more lethargic and route and they upgraded her to a level 1 trauma.  Per her records she is on aspirin  and no other anticoagulants.  The history is provided by the EMS personnel.       Prior to Admission medications  Medication Sig Start Date End Date Taking? Authorizing Provider  amLODipine  (NORVASC ) 10 MG tablet Take 1 tablet (10 mg total) by mouth daily. 02/18/23  Yes Rai, Ripudeep K, MD  aspirin  81 MG chewable tablet Chew 81 mg by mouth in the morning.   Yes [provider]  bisacodyl  (DULCOLAX) 10 MG suppository Place 10 mg rectally daily as needed for moderate constipation.   Yes [provider]  busPIRone  (BUSPAR ) 7.5 MG tablet Take 7.5 mg by mouth 3 (three) times daily. 01/04/24  Yes [provider]  DEPAKOTE  SPRINKLES 125 MG capsule Take 125 mg by mouth daily. 11/19/23  Yes [provider]  FLUoxetine  (PROZAC ) 20 MG capsule Take 20 mg by mouth daily.   Yes [provider]  GERI-TUSSIN 100 MG/5ML liquid Take 10 mLs by mouth every 6 (six) hours as needed for cough. 01/08/24  Yes [provider]  LORazepam  (ATIVAN ) 0.5 MG tablet Take 1 tablet (0.5 mg total) by mouth daily at 12 noon. Give one tablet by mouth daily in the afternoon for agitation. Patient taking differently: Take 0.5 mg by mouth 2 (two) times daily. 05/11/24  Yes Amin, Ankit C, MD  magnesium  hydroxide (MILK OF  MAGNESIA) 400 MG/5ML suspension Take 30 mLs by mouth daily as needed for mild constipation.   Yes [provider]  metoprolol  tartrate (LOPRESSOR ) 25 MG tablet Take 25 mg by mouth 2 (two) times daily. 01/07/24  Yes [provider]  Multiple Vitamins-Minerals (MULTIVITAMIN WITH MINERALS) tablet Take 1 tablet by mouth daily. Decubi-Vite Oral Capsule   Yes [provider]  polyethylene glycol powder (GLYCOLAX /MIRALAX ) 17 GM/SCOOP powder Take 17 g by mouth daily.   Yes [provider]  Sodium Phosphates (ENEMA RE) Place 1 Dose rectally daily as needed (constipation not relieved by dulcolax suppository).   Yes [provider]  UNABLE TO FIND Take 120 mLs by mouth in the morning and at bedtime. Med Name: med pass   Yes [provider]    Allergies: Hydrochlorothiazide, Penicillins, Cipro  [ciprofloxacin  hcl], Iodinated contrast media, Iodine -131, Lasix [furosemide], Latex, Lipitor [atorvastatin], Lortab [hydrocodone -acetaminophen ], Niaspan [niacin], Statins, Toprol  xl [metoprolol ], Vibramycin [doxycycline], Bactrim [sulfamethoxazole-trimethoprim], Codeine, Nickel, Sulfa antibiotics, Sulfonamide derivatives, and Tegretol [carbamazepine]    Review of Systems  Updated Vital Signs BP 116/82   Pulse 69   Temp 98.1 F (36.7 C) (Axillary)   Resp 17   SpO2 100%   Physical Exam Vitals and nursing note reviewed.  Constitutional:      General: She is not in acute distress.    Appearance: Normal appearance.     Comments:  Saying ow and help me  HENT:     Head: Normocephalic.     Comments: Hematoma with abrasion to L side of forehead    Nose: Nose normal.     Mouth/Throat:     Mouth: Mucous membranes are moist.     Pharynx: Oropharynx is clear.  Eyes:     Extraocular Movements: Extraocular movements intact.     Conjunctiva/sclera: Conjunctivae normal.     Comments: R pupil 3 mm, L pupil 4 mm  Neck:     Comments: No midline neck tenderness,  c-collar in place Cardiovascular:     Rate and Rhythm: Normal rate and regular rhythm.     Heart sounds: Normal heart sounds.  Pulmonary:     Effort: Pulmonary effort is normal.     Breath sounds: Normal breath sounds.  Abdominal:     General: Abdomen is flat.     Palpations: Abdomen is soft.     Tenderness: There is no abdominal tenderness.  Musculoskeletal:        General: Normal range of motion.     Comments: No midline back tenderness No bony tenderness to bilateral UE Or LE  Skin:    General: Skin is warm and dry.     Comments: Non-bleeding skin tear to chest  Neurological:     Mental Status: She is alert.     Comments: Oriented to person only, following commands in all 4 extremities  GCS 11 (E1, V4, M6)     (all labs ordered are listed, but only abnormal results are displayed) Labs Reviewed  COMPREHENSIVE METABOLIC PANEL WITH GFR - Abnormal; Notable for the following components:      Result Value   Glucose, Bld 155 (*)    Creatinine, Ser 1.01 (*)    GFR, Estimated 53 (*)    All other components within normal limits  I-STAT CG4 LACTIC ACID, ED - Abnormal; Notable for the following components:   Lactic Acid, Venous 2.0 (*)    All other components within normal limits  I-STAT CHEM 8, ED - Abnormal; Notable for the following components:   Potassium 3.4 (*)    Chloride 97 (*)    Glucose, Bld 142 (*)    Calcium , Ion 1.14 (*)    All other components within normal limits  CBC  PROTIME-INR  ETHANOL  URINALYSIS, ROUTINE W REFLEX MICROSCOPIC  I-STAT CHEM 8, ED  I-STAT CG4 LACTIC ACID, ED  SAMPLE TO BLOOD BANK    EKG: EKG Interpretation Date/Time:  Monday September 22 2024 15:05:37 EST Ventricular Rate:  75 PR Interval:    QRS Duration:  103 QT Interval:  380 QTC Calculation: 425 R Axis:   -59  Text Interpretation: Atrial fibrillation Left anterior fascicular block Abnormal R-wave progression, late transition LVH with secondary repolarization abnormality Since  last tracing of earlier today No significant change was found Confirmed by Ellouise Fine ([id number]) on 09/22/2024 3:08:51 PM  Radiology: DG Pelvis Portable Result Date: 09/22/2024 EXAM: 1 or 2 VIEW(S) XRAY OF THE PELVIS 09/22/2024 02:36:00 PM COMPARISON: 05/01/2023 CLINICAL HISTORY: Trauma FINDINGS: BONES AND JOINTS: Short right femoral intramedullary nail which is intact, without findings of loosening. Heterotopic bone about the right intertrochanteric region. Mild bilateral hip osteoarthritis. Diffuse osteopenia. Bilateral sacroiliac and pubic symphysis degenerative changes. No acute fracture, pelvic bone diastasis, or dislocation. SOFT TISSUES: Unremarkable. IMPRESSION: 1. Osteopenia. No acute fracture, pelvic bone diastasis, or dislocation. Electronically signed by: Rogelia Myers MD MD 09/22/2024 03:02 PM EST RP Workstation: [id number]  CT CERVICAL SPINE WO CONTRAST Result Date: 09/22/2024 EXAM: CT CERVICAL SPINE WITHOUT CONTRAST 09/22/2024 02:57:15 PM TECHNIQUE: CT of the cervical spine was performed without the administration of intravenous contrast. Multiplanar reformatted images are provided for review. Automated exposure control, iterative reconstruction, and/or weight based adjustment of the mA/kV was utilized to reduce the radiation dose to as low as reasonably achievable. COMPARISON: CT cervical spine 01/16/2024. CLINICAL HISTORY: Polytrauma, blunt. Fall. FINDINGS: BONES AND ALIGNMENT: Unchanged alignment with multilevel trace degenerative anterolisthesis. No acute fracture is identified, however mild to moderate motion artifact limits sensitivity for detection of nondisplaced fractures. No destructive process. DEGENERATIVE CHANGES: Overall mild diffuse cervical disc degeneration and asymmetrically advanced left sided cervical facet arthrosis. No evidence of high grade spinal canal stenosis. SOFT TISSUES: No prevertebral soft tissue swelling. These results were communicated to Dr. Sebastian at  2:50 PM on 09/22/2024 by secure text page via the [facility] messaging system. IMPRESSION: 1. No acute cervical spine fracture or traumatic malalignment; motion artifact limits sensitivity for detection of nondisplaced fractures. Electronically signed by: Dasie Hamburg MD MD 09/22/2024 03:02 PM EST RP Workstation: [id number]   CT CHEST ABDOMEN PELVIS WO CONTRAST Result Date: 09/22/2024 CLINICAL DATA:  Polytrauma, blunt. EXAM: CT CHEST, ABDOMEN AND PELVIS WITHOUT CONTRAST TECHNIQUE: Multidetector CT imaging of the chest, abdomen and pelvis was performed following the standard protocol without IV contrast. RADIATION DOSE REDUCTION: This exam was performed according to the departmental dose-optimization program which includes automated exposure control, adjustment of the mA and/or kV according to patient size and/or use of iterative reconstruction technique. COMPARISON:  CT scan chest from 09/27/2022 and CT scan lumbar spine from 02/17/2021. FINDINGS: CT CHEST FINDINGS Cardiovascular: Mild cardiomegaly. No pericardial effusion. No aortic aneurysm. There are coronary artery calcifications, in keeping with coronary artery disease. There are also mild to moderate peripheral atherosclerotic vascular calcifications of thoracic aorta and its major branches. Mediastinum/Nodes: Visualized thyroid  gland appears grossly unremarkable. No solid / cystic mediastinal masses. The esophagus is nondistended precluding optimal assessment. There are few mildly prominent mediastinal and bilateral axillary lymph nodes, which do not meet the size criteria for lymphadenopathy and appear grossly similar to the prior study, favoring benign etiology. Evaluation of bilateral hila is limited due to lack on intravenous contrast: however, no large hilar lymphadenopathy identified. Lungs/Pleura: The central tracheo-bronchial tree is patent. There is mosaic attenuation of lungs, consistent with heterogeneous air trapping related to small airways disease.  There are patchy areas of linear, plate-like atelectasis and/or scarring throughout bilateral lungs. There is trace right pleural effusion, significantly decreased since the prior study. No left pleural effusion. No mass or consolidation. No pneumothorax. There is a stable sub 4 mm noncalcified nodule in the right lung apex (series 5, image 19). No new or suspicious lung nodule. Musculoskeletal: The visualized soft tissues of the chest wall are grossly unremarkable. No suspicious osseous lesions. There are mild to moderate multilevel degenerative changes in the visualized spine. Multiple old healed right posteromedial rib fractures noted. CT ABDOMEN PELVIS FINDINGS Hepatobiliary: The liver is normal in size. Non-cirrhotic configuration. No suspicious mass. No intrahepatic or extrahepatic bile duct dilation. No calcified gallstones. Normal gallbladder wall thickness. No pericholecystic inflammatory changes. Pancreas: Unremarkable. No pancreatic ductal dilatation or surrounding inflammatory changes. Spleen: Within normal limits. No focal lesion. Adrenals/Urinary Tract: Adrenal glands are unremarkable. No suspicious renal mass within the limitations of this unenhanced exam. No nephroureterolithiasis or obstructive uropathy. Urinary bladder is under distended, precluding optimal assessment. However, no large mass or stones identified. No  perivesical fat stranding. Stomach/Bowel: No disproportionate dilation of the small or large bowel loops. No evidence of abnormal bowel wall thickening or inflammatory changes. The appendix is unremarkable. Vascular/Lymphatic: No ascites or pneumoperitoneum. No abdominal or pelvic lymphadenopathy, by size criteria. No aneurysmal dilation of the major abdominal arteries. There are moderate peripheral atherosclerotic vascular calcifications of the aorta and its major branches. Reproductive: The uterus is surgically absent. No large adnexal mass. Other: There is a tiny fat containing  umbilical hernia. The soft tissues and abdominal wall are otherwise unremarkable. Musculoskeletal: No suspicious osseous lesions. There are mild - moderate multilevel degenerative changes in the visualized spine. Metallic hardware noted in the right proximal femur. IMPRESSION: 1. No acute traumatic injury to the chest, abdomen or pelvis. 2. Otherwise essentially unremarkable exam, as described above. Aortic Atherosclerosis (ICD10-I70.0). Critical Value/emergent results were called by telephone at the time of interpretation on 09/22/2024 at 3:00 pm to Dr. Sebastian, who verbally acknowledged these results. Electronically Signed   By: Ree Molt M.D.   On: 09/22/2024 15:00   DG Chest Port 1 View Result Date: 09/22/2024 EXAM: 1 VIEW XRAY OF THE CHEST 09/22/2024 02:36:00 PM COMPARISON: 05/04/2024 CLINICAL HISTORY: Trauma FINDINGS: LUNGS AND PLEURA: Low lung volumes. Elevation of the right hemidiaphragm. No focal pulmonary opacity. No pleural effusion. No pneumothorax. HEART AND MEDIASTINUM: Tortuous aorta with aortic atherosclerosis. BONES AND SOFT TISSUES: Multilevel degenerative disc disease of the spine. Diffuse osteopenia. IMPRESSION: 1. Low lung volumes. No acute cardiopulmonary abnormality. Electronically signed by: Rogelia Myers MD MD 09/22/2024 02:59 PM EST RP Workstation: [id number]   CT HEAD WO CONTRAST Result Date: 09/22/2024 EXAM: CT HEAD WITHOUT CONTRAST 09/22/2024 02:28:00 PM TECHNIQUE: CT of the head was performed without the administration of intravenous contrast. Automated exposure control, iterative reconstruction, and/or weight based adjustment of the mA/kV was utilized to reduce the radiation dose to as low as reasonably achievable. COMPARISON: Head CT 01/16/2024 and MRI 08/10/2022. CLINICAL HISTORY: Head trauma, moderate-severe. Fall. FINDINGS: BRAIN AND VENTRICLES: There is no evidence of an acute infarct, intracranial hemorrhage, mass, midline shift, hydrocephalus, or extra-axial fluid  collection. There is moderate cerebral atrophy. Confluent hypodensities in the cerebral white matter bilaterally are similar to the prior CT and nonspecific but compatible with severe chronic small vessel ischemic disease. Calcified atherosclerosis at the skull base. ORBITS: Bilateral cataract extraction. SINUSES: Partially visualized chronic left maxillary sinusitis. Clear mastoid air cells. SOFT TISSUES AND SKULL: Left lateral forehead hematoma. No skull fracture. These results were communicated to Dr. Sebastian at 2:50 PM on 09/22/2024 by secure text page via the [facility] messaging system. IMPRESSION: 1. No acute intracranial abnormality. 2. Left lateral forehead hematoma. 3. Moderate cerebral atrophy and severe chronic small vessel ischemic disease. Electronically signed by: Dasie Hamburg MD MD 09/22/2024 02:51 PM EST RP Workstation: [id number]     Procedures   Medications Ordered in the ED  ondansetron  (ZOFRAN ) 4 MG/2ML injection (has no administration in time range)    Clinical Course as of 09/22/24 1527  Mon Sep 22, 2024  1504 No acute traumatic injury seen on imaging. Mildly elevated lactic, otherwise labs within normal range. [VK]  1513 RN did notify me on way back from CT patient had a suspected vagal episode where HR went to 30's and patient vomited, and then was back to her normal status as she was when she arrived. Repeat EKG still showed rate controlled a fib. Will plan for admission for AMS. [VK]    Clinical Course User Index [VK] Kingsley, Raynell Upton K,  DO                                 Medical Decision Making This patient presents to the ED with chief complaint(s) of fall with pertinent past medical history of dementia, a fib not on [facility] which further complicates the presenting complaint. The complaint involves an extensive differential diagnosis and also carries with it a high risk of complications and morbidity.    The differential diagnosis includes ICH, mass effect, hematoma,  abrasion, skin tear, blunt thoracic or abdominal trauma, no other traumatic injuries, arrhythmia, anemia, dehydration, electrolyte abnormality  Additional history obtained: Additional history obtained from EMS  Records reviewed Nursing Home Documents and outpatient cardiology records  ED Course and Reassessment: Patient was made a prehospital arrival level 1 trauma and was evaluated by Dr. Sebastian with trauma surgery and myself on her arrival.  Her primary survey was intact with a GCS of 11 on her arrival.  Secondary survey was significant for hematoma and abrasion to the left side of her forehead as well as skin tear to her chest.  FAST exam by Dr. Sebastian was negative.  Patient will have imaging to evaluate for traumatic injury as well as labs to evaluate for cause of her fall and she will be closely reassessed.  Independent labs interpretation:  The following labs were independently interpreted: mildly elevated lactic, otherwise labs within normal range  Independent visualization of imaging: - I independently visualized the following imaging with scope of interpretation limited to determining acute life threatening conditions related to emergency care: CTH/C-spine, CTCAP, CXR, pelvis XR, which revealed no acute traumatic injury  Consultation: - Consulted or discussed management/test interpretation w/ external professional: trauma, hospitalist  Consideration for admission or further workup: patient requires admission for AMS Social Determinants of health: N/A    Amount and/or Complexity of Data Reviewed Labs: ordered. Radiology: ordered.  Risk Decision regarding hospitalization.       Final diagnoses:  Fall, initial encounter  Traumatic hematoma of forehead, initial encounter  Somnolence    ED Discharge Orders     None          Ellouise Richerd POUR, DO 09/22/24 1527  "

## 2024-09-22 NOTE — TOC CM/SW Note (Signed)
 TOC consult received for d/c planning needs. Per notes, patient may reside at [facility] in [facility]. Sent message via Epic to confirm. Awaiting response. Patient did discharge to [facility] in September of 2025. Follow-up to be completed with patient as appropriate.   Merilee Batty, MSN, RN Case Management [phone]

## 2024-09-22 NOTE — Progress Notes (Signed)
 Orthopedic Tech Progress Note Patient Details:  Tiffany Black 27-Mar-1934 [id number] LV1T FOT. Ortho not needed at this time. Patient ID: Tiffany Black, female   DOB: [birth year], 89 y.o.   MRN: [id number]  [name] 09/22/2024, 2:26 PM

## 2024-09-22 NOTE — Assessment & Plan Note (Signed)
 Head hematoma, chest laceration -No other deformities on exam  - Status post evaluation by ED team and trauma team -images reviewed in detail -no dislocations or fractures - Fall precautions

## 2024-09-22 NOTE — Assessment & Plan Note (Signed)
 Status posttraumatic fall -Sustaining superficial hematoma and laceration on chest -All images were reviewed in detail acute fractures or dislocation -Fall precautions -Consulting PT OT for evaluation recommendation

## 2024-09-22 NOTE — Assessment & Plan Note (Signed)
 Not chronically anticoagulated secondary to recurrent falls -Continue metoprolol , aspirin 

## 2024-09-22 NOTE — Assessment & Plan Note (Signed)
 BP stable -Continue Lopressor , amlodipine ,

## 2024-09-23 ENCOUNTER — Encounter (HOSPITAL_COMMUNITY): Payer: Self-pay | Admitting: Family Medicine

## 2024-09-23 DIAGNOSIS — F0390 Unspecified dementia without behavioral disturbance: Secondary | ICD-10-CM

## 2024-09-23 DIAGNOSIS — Z7189 Other specified counseling: Secondary | ICD-10-CM

## 2024-09-23 DIAGNOSIS — Z515 Encounter for palliative care: Secondary | ICD-10-CM

## 2024-09-23 DIAGNOSIS — I429 Cardiomyopathy, unspecified: Secondary | ICD-10-CM

## 2024-09-23 DIAGNOSIS — W19XXXA Unspecified fall, initial encounter: Secondary | ICD-10-CM

## 2024-09-23 DIAGNOSIS — Z8673 Personal history of transient ischemic attack (TIA), and cerebral infarction without residual deficits: Secondary | ICD-10-CM

## 2024-09-23 DIAGNOSIS — G9341 Metabolic encephalopathy: Secondary | ICD-10-CM

## 2024-09-23 DIAGNOSIS — Y92009 Unspecified place in unspecified non-institutional (private) residence as the place of occurrence of the external cause: Secondary | ICD-10-CM

## 2024-09-23 DIAGNOSIS — I480 Paroxysmal atrial fibrillation: Secondary | ICD-10-CM

## 2024-09-23 DIAGNOSIS — N390 Urinary tract infection, site not specified: Principal | ICD-10-CM

## 2024-09-23 LAB — MAGNESIUM: Magnesium: 1.9 mg/dL (ref 1.7–2.4)

## 2024-09-23 LAB — CBG MONITORING, ED: Glucose-Capillary: 82 mg/dL (ref 70–99)

## 2024-09-23 LAB — BASIC METABOLIC PANEL WITH GFR
Anion gap: 10 (ref 5–15)
BUN: 10 mg/dL (ref 8–23)
CO2: 27 mmol/L (ref 22–32)
Calcium: 9.2 mg/dL (ref 8.9–10.3)
Chloride: 102 mmol/L (ref 98–111)
Creatinine, Ser: 0.9 mg/dL (ref 0.44–1.00)
GFR, Estimated: >60 mL/min
Glucose, Bld: 104 mg/dL — ABNORMAL HIGH (ref 70–99)
Potassium: 4.2 mmol/L (ref 3.5–5.1)
Sodium: 138 mmol/L (ref 135–145)

## 2024-09-23 LAB — PHOSPHORUS: Phosphorus: 2.8 mg/dL (ref 2.5–4.6)

## 2024-09-23 MED ORDER — FOSFOMYCIN TROMETHAMINE 3 G PO PACK
3.0000 g | PACK | Freq: Once | ORAL | Status: AC
  Administered 2024-09-23: 3 g via ORAL
  Filled 2024-09-23 (×2): qty 3

## 2024-09-23 MED ORDER — MUPIROCIN 2 % EX OINT
TOPICAL_OINTMENT | Freq: Two times a day (BID) | CUTANEOUS | Status: DC
  Administered 2024-09-24: 1 via TOPICAL
  Filled 2024-09-23: qty 22

## 2024-09-23 NOTE — Progress Notes (Signed)
 Patient son [name], notified of admission.

## 2024-09-23 NOTE — Progress Notes (Signed)
 OT Cancellation Note and Discharge  Patient Details Name: Tiffany Black MRN: [id number] DOB: 05-20-1934   Cancelled Treatment:    Reason Eval/Treat Not Completed: OT screened, no needs identified, will sign off. Called and spoke to staff at [facility]. Staff there report [name] is total A for bed mobility, hoyer lift out of bed, once in wheelchair she can maneuver herself around a little on her own but not much, she is dependent in all her basic ADLs except she can feed herself with setup and constant encouragement. She will yell out help, help, help when they are trying to work with her on transfers and ADLs as well as at times she is resistant to care. No acute therapy needs identified, based off pt's PLOF, we will sign off.  Donny BECKER OT Acute Rehabilitation Services Office [phone]    [place] Dorothyann Distel 09/23/2024, 1:46 PM

## 2024-09-23 NOTE — Assessment & Plan Note (Signed)
 Repleted

## 2024-09-23 NOTE — Assessment & Plan Note (Signed)
-   Palliative care also consulted on admission - Patient has advanced dementia and low functioning at baseline.  She is total assist for bed mobility needing Hoyer lift to wheelchair.  Dependent on basic ADLs but otherwise requires assistance with most others.  She is also resistant to care at times at [facility] - son has discussed with palliative care as well on 1/13; wishes are to remain full code and full scope

## 2024-09-23 NOTE — Consult Note (Signed)
 WOC Nurse Consult Note: Reason for Consult: multiple wounds from fall  Wound type: 1. Partial thickness L forehead with underlying hematoma  2.  Full thickness chest wall post trauma 80% yellow 20% red  3.  Partial thickness R foot r/t trauma red moist  Pressure Injury POA: not pressure  Measurement: see nursing flowsheet  Wound bed: as above  Drainage (amount, consistency, odor) dried blood noted on forehead wound  Periwound: Dressing procedure/placement/frequency:  Cleanse L forehead wound/hematoma with NS, apply Mupirocin  ointment to wound 2 times daily and leave open to air. If begins to bleed would apply a Telfa nonstick dressing and clothe tape or silicone foam to reduce adherence.  Cleanse chest and R foot wounds with NS, apply Xeroform gauze ([place] [phone]) to wound beds daily and secure with silicone foam.  May lift foam daily to replace Xeroform, change foam q3 days and prn soiling.  Moist Xeroform with saline if adhered to wound bed for atraumatic removal.  POC discussed with bedside nurse. WOC team will not follow Reconsult if further needs arise.   Thank you,    Powell Bar MSN, RN-BC, [employer]

## 2024-09-23 NOTE — Progress Notes (Signed)
 " Progress Note    Tiffany Black   FMW:[id number]  DOB: 05-28-1934  DOA: 09/22/2024     1 PCP: [name], MD  Initial CC: [facility] Course: Tiffany Black is an 89 year old female with advanced Dementia, Anxiety, Falls, P-A-fib (not anticoagulant), BPV, cardiomyopathy, chronic back pain, CVAs (right temporal/left frontal lobe infarct), HTN/HLD, IBS, insomnia, OA.  She presents from SNF after a fall and confusion.   Per record apparently patient had a witnessed fall at the nursing home facility where she hit her head. No report of loss of consciousness.  Subsequently became increasingly lethargic, confused and agitated.  Not chronically anticoagulated, with exception of aspirin .  CT head negative for acute abnormalities.  Left lateral forehead hematoma appreciated with underlying moderate cerebral atrophy and severe chronic small vessel disease.  She was admitted for monitoring after fall and ongoing lethargy.  Interval History:  Difficult to get her to engage this morning.  Mostly laying in bed with her eyes closed.  Upon trying to open her left eye passively, she moaned and turned her head.  She was able to take in some medicine with applesauce as nurse was giving bedside.  Assessment and Plan: * Fall at home, initial encounter - s/p fall at [facility]; appears she is total assist for bed mobility requiring Hoyer lift out of bed to the wheelchair -Sustained left frontal hematoma after fall - No acute findings on CT cervical spine or CT chest/abdomen/pelvis - Evaluated by trauma team as well  Acute metabolic encephalopathy - Underlying severe dementia at baseline; dependent on most all ADLs and resistant to care at times at her SNF per note review - suspect she isn't far off her baseline, just sore and uncomfortable s/p fall and not wanting to engage with staff as much - empirically treating UTI with fosfomycin    Goals of care, counseling/discussion - Palliative  care also consulted on admission - Patient has advanced dementia and low functioning at baseline.  She is total assist for bed mobility needing Hoyer lift to wheelchair.  Dependent on basic ADLs but otherwise requires assistance with most others.  She is also resistant to care at times at [facility]  Atrial fibrillation with RVR (HCC) Not chronically anticoagulated secondary to recurrent falls -Continue metoprolol , aspirin  when appropriate  History of CVA (cerebrovascular accident) History of previous strokes in the setting of atrial fibrillation -Not anticoagulated due to recurrent falls -Advanced dementia -Stable, continue aspirin  when appropriate  Anemia of chronic disease History of anemia of chronic disease - Hemoglobin higher than prior values, possibly hemoconcentrated -Follow-up CBC in a.m.  GAD (generalized anxiety disorder) - No controlled substances noted on database - Need to verify Ativan  on her SNF MAR to see if she getting consistently - Continue Prozac  and BuSpar  only for now  Essential hypertension BP stable -Continue Lopressor , amlodipine ,   Hypokalemia - Replete as needed   Antimicrobials: Fosfomycin  09/23/2024 x 1  Consultants:  Palliative care  Procedures:    DVT prophylaxis:  Place and maintain sequential compression device Start: 09/22/24 1544 Place TED hose Start: 09/22/24 1544 SCDs Start: 09/22/24 1542   Code Status:   Code Status: Full Code  Barriers to discharge: None Therapy evaluation: PT Orders: Active   PT Follow up Rec:   Disposition Plan: SNF Status is: Inpatient  Mobility Assessment (Last 72 Hours)     Mobility Assessment   No documentation.     Diet: Diet Orders (From admission, onward)  Start     Ordered   09/22/24 1542  Diet regular Room service appropriate? Yes; Fluid consistency: Thin  Diet effective now       Question Answer Comment  Room service appropriate? Yes   Fluid consistency: Thin      09/22/24 1543             Objective: Blood pressure (!) 151/101, pulse 72, temperature (!) 97.4 F (36.3 C), resp. rate 16, height 5' 9 (1.753 m), weight 86.2 kg, SpO2 100%.  Examination:  Physical Exam Constitutional:      General: She is not in acute distress. HENT:     Head: Normocephalic and atraumatic.     Comments: Dried blood noted over left forehead extending to left anterior scalp.  Significantly swollen left eyelid with bruising    Mouth/Throat:     Mouth: Mucous membranes are moist.  Cardiovascular:     Rate and Rhythm: Normal rate and regular rhythm.  Pulmonary:     Effort: Pulmonary effort is normal.     Breath sounds: Normal breath sounds.  Abdominal:     General: Bowel sounds are normal. There is no distension.     Palpations: Abdomen is soft.  Musculoskeletal:     Cervical back: Normal range of motion and neck supple.     Comments: Slightly rigid upper extremities with increased tone when trying to passively test range of motion  Neurological:     Mental Status: Mental status is at baseline. She is disoriented.      Data Reviewed: Results for orders placed or performed during the hospital encounter of 09/22/24 (from the past 24 hours)  Urinalysis, Routine w reflex microscopic -Urine, Clean Catch     Status: Abnormal   Collection Time: 09/22/24  5:45 PM  Result Value Ref Range   Color, Urine AMBER (A) YELLOW   APPearance CLOUDY (A) CLEAR   Specific Gravity, Urine 1.014 1.005 - 1.030   pH 6.0 5.0 - 8.0   Glucose, UA NEGATIVE NEGATIVE mg/dL   Hgb urine dipstick NEGATIVE NEGATIVE   Bilirubin Urine NEGATIVE NEGATIVE   Ketones, ur NEGATIVE NEGATIVE mg/dL   Protein, ur 899 (A) NEGATIVE mg/dL   Nitrite NEGATIVE NEGATIVE   Leukocytes,Ua LARGE (A) NEGATIVE   RBC / HPF 0-5 0 - 5 RBC/hpf   WBC, UA >50 0 - 5 WBC/hpf   Bacteria, UA MANY (A) NONE SEEN   Squamous Epithelial / HPF 0-5 0 - 5 /HPF  Magnesium      Status: None   Collection Time: 09/23/24  1:28 AM  Result Value  Ref Range   Magnesium  1.9 1.7 - 2.4 mg/dL  Phosphorus     Status: None   Collection Time: 09/23/24  1:28 AM  Result Value Ref Range   Phosphorus 2.8 2.5 - 4.6 mg/dL  Basic metabolic panel     Status: Abnormal   Collection Time: 09/23/24  1:28 AM  Result Value Ref Range   Sodium 138 135 - 145 mmol/L   Potassium 4.2 3.5 - 5.1 mmol/L   Chloride 102 98 - 111 mmol/L   CO2 27 22 - 32 mmol/L   Glucose, Bld 104 (H) 70 - 99 mg/dL   BUN 10 8 - 23 mg/dL   Creatinine, Ser 9.09 0.44 - 1.00 mg/dL   Calcium  9.2 8.9 - 10.3 mg/dL   GFR, Estimated >39 >39 mL/min   Anion gap 10 5 - 15  CBG monitoring, ED     Status: None  Collection Time: 09/23/24  7:50 AM  Result Value Ref Range   Glucose-Capillary 82 70 - 99 mg/dL    I have reviewed pertinent nursing notes, vitals, labs, and images as necessary. I have ordered labwork to follow up on as indicated.  I have reviewed the last notes from staff over past 24 hours. I have discussed patient's care plan and test results with nursing staff, CM/SW, and other staff as appropriate.  Old records reviewed in assessment of this patient  Time spent: Greater than 50% of the 55 minute visit was spent in counseling/coordination of care for the patient as laid out in the A&P.   LOS: 1 day   Alm Apo, MD [facility] 09/23/2024, 3:15 PM "

## 2024-09-23 NOTE — Consult Note (Addendum)
 "                                                  Palliative Care Consult Note                                  Date: 09/23/2024   Patient Name: Tiffany Black  DOB:[birth year]  FMW:[id number]  Age / Sex:89 y.o.,female, female  PCP: [name], MD Referring Physician: Patsy Lenis, MD  Reason for Consultation: Establishing goals of care  Past Medical History:  Diagnosis Date   Anxiety    Arthritis    BPPV (benign paroxysmal positional vertigo)    Breast lesion    left   Bulging of cervical intervertebral disc    Cardiomegaly    Cataracts, bilateral    Chronic neck pain    CVA (cerebral vascular accident) [facility])    x2 2013 and 2015; right temporal lobe and left frontal lobe infarct-> dzziness which resolved; occluded right PCA, moderate posterior left cerebral stenosis, left MCA stenosis on MRA   Dementia [facility])    Essential hypertension    History of endometrial cancer    History of stroke    2015 and 2016 // carotid US  8/16: Bilateral 1-39 // carotid US  2/16: Bilateral ICA 1-39   HLD (hyperlipidemia)    Hx of echocardiogram    a. Echo (11/15): Mild LVH, EF 55-60%, normal wall motion, grade 2 diastolic dysfunction, mild LAE, normal RV function, PASP 29 mm Hg // echo 8/16:Mild concentric LVH, EF 55-60, normal wall motion, grade 2 diastolic dysfunction, mild LAE, PASP 39   Hypertension    IBS (irritable bowel syndrome)    Insomnia    Lung nodule    OA (osteoarthritis)    Pectus excavatum    Persistent atrial fibrillation (HCC)    Pleural effusion    right recurrent, 1/24 s/p thoracentesis x 3 (cytology negative)- Dr Kara   Pneumonia due to COVID-19 virus    Radius fracture    left distal   Vertigo    Vitamin D deficiency      Assessment & Plan:   HPI/Patient Profile: 89 y.o. female  with past medical history of dementia, CVAs, paroxysmal atrial fibrillation, cardiomyopathy admitted on 09/22/2024 after a fall. Per ED provider note on 09/22/2024 by Lamarr DO,  patient had a witness fall with head strike but unsure if patient loss consciousness. CT spine on 09/22/2024 did not demonstrate any abnormalities. CT head on 09/22/2024 demonstrated left lateral forehead hematoma, moderate cerebral atrophy and severe chronic small vessel ischemic disease and no acute intracranial abnormality. Patient found to have UTI with UA positive for bacteria and leukocytes for which she is on fosfomycin .   Palliative medicine consulted for goals of care conversation and code status.   SUMMARY OF RECOMMENDATIONS   Full code, full scope, despite education for the recommendation of DNR Continue current medical interventions HCPOA/Son is Leya Paige [phone] Will consider palliative outpatient following   Symptom Management:  Per primary team  Code Status: Full Code  Prognosis:  Unable to determine  Discharge Planning:  To Be Determined   Discussed with: Girgius MD 09/23/2024 about discussion with son who wants to continue current medical interventions but is aware that his mother is deteriorating.  Subjective:   Reviewed medical records, received report from team, assessed the patient and then meet at the patient's bedside to discuss diagnosis, prognosis, GOC, EOL wishes disposition and options.  I met with patient at bedside without any visitors present. Spoke on phone with son Tiffany Black)   We meet to discuss diagnosis prognosis, GOC, EOL wishes, disposition and options. Concept of Palliative Care was introduced as specialized medical care for people and their families living with serious illness.  If focuses on providing relief from the symptoms and stress of a serious illness.  The goal is to improve quality of life for both the patient and the family. Values and goals of care important to patient and family were attempted to be elicited.  Created space and opportunity for patient  and family to explore thoughts and feelings regarding  current medical situation   Natural trajectory and current clinical status were discussed. Questions and concerns addressed. Patient encouraged to call with questions or concerns.    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 and raising her kids and caring for her family  Patient Values: - Son shares that she is of [place] faith  Baseline Status: - Patient has been at a skilled nursing facility for about a year now and has been to a couple facilities including [facility], [facility], and most recently since October at [facility] - Son shares that prior to her fall in August resulting in a hip fracture s/p repair, patient was fairly independent, ambulatory and able to perform most ADLs and only needing assistance with showers - Since August 2025 and her fall, patient has been deteriorating and son was initially hoping to get her to ambulate again but is now wheelchair bound and son shares that this is going to be her new baseline - Son shares that patient has lost about 30 pounds since August, she has been eating less and has good days occasionally - Son shares that the patient is less interactive when she is unfamiliar with staff  Today's Discussion: - Spoke with son about how the patient has been doing prior to hospitalization, current medical course, and next steps - Updated son that patient is stable at this time and CT scans did not show any abnormalities that is concerning and is being treated for a UTI at this time - Inquired about the son's overall thoughts on  how the patient has been doing over the last couple of months and he frankly shares that he knows his mother does not have a lot of time left and he has seen her deteriorate, but would like to continue current interventions - Educated son that it is very common to have patients who initially come in after a fall continue to unfortunately deteriorate despite aggressive interventions - Discussed with son about the patient's code status, son shares that he would not want his mother's life to be prolonged if she were in a vegetative state, but for now wants her to remain full code - Educated son that given the patient's dementia and  overall failure to thrive in just the last 3 months, it would not be the recommendation for her to remain full code as it would cause more harm than help at this point, son expressed understanding but elects for her to remain full code  Goals: - Son wants mother to recover enough to return to [facility]  Review of Systems  Unable to perform ROS   Objective:   Primary Diagnoses: Present on Admission:  Altered mental status  Acute renal failure superimposed on stage 3b chronic kidney disease (HCC)  Anemia of chronic disease  Cerebral infarction (HCC)  Essential hypertension  GAD (generalized anxiety disorder)  Falls  Atrial fibrillation with RVR (HCC)  Hypokalemia   Vital Signs:  BP (!) 176/100   Pulse 64   Temp (!) 97.4 F (36.3 C)   Resp 12   Ht 5' 9 (1.753 m)   Wt 86.2 kg   SpO2 99%   BMI 28.06 kg/m   Physical Exam Constitutional:      Appearance: She is ill-appearing.     Comments: Somenolent, minimally interactive  HENT:     Head:     Comments: Left forehead with dried blood.     Nose: Nose normal.  Eyes:     Comments: Left periorbital edema and contusion  Cardiovascular:     Rate and Rhythm: Normal rate.     Pulses: Normal pulses.  Pulmonary:     Effort: Pulmonary effort is normal.  Skin:    General: Skin is warm and dry.   Neurological:     Comments: Minimally responsive to voice    Palliative Assessment/Data: 40%   Thank you for allowing us  to participate in the care of Tiffany Black PMT will continue to support holistically.  I personally spent a total of 55 minutes in the care of the patient today including preparing to see the patient, getting/reviewing separately obtained history, performing a medically appropriate exam/evaluation, counseling and educating, referring and communicating with other health care professionals, and documenting clinical information in the EHR.  Signed by: Fairy FORBES Shan DEVONNA Palliative Medicine Team  Team Phone # [phone] (Nights/Weekends)  09/23/2024, 12:33 PM   "

## 2024-09-24 DIAGNOSIS — Z7189 Other specified counseling: Secondary | ICD-10-CM | POA: Diagnosis not present

## 2024-09-24 DIAGNOSIS — I739 Peripheral vascular disease, unspecified: Secondary | ICD-10-CM

## 2024-09-24 DIAGNOSIS — Y92009 Unspecified place in unspecified non-institutional (private) residence as the place of occurrence of the external cause: Secondary | ICD-10-CM | POA: Diagnosis not present

## 2024-09-24 DIAGNOSIS — G319 Degenerative disease of nervous system, unspecified: Secondary | ICD-10-CM

## 2024-09-24 DIAGNOSIS — S0083XA Contusion of other part of head, initial encounter: Secondary | ICD-10-CM

## 2024-09-24 DIAGNOSIS — W19XXXA Unspecified fall, initial encounter: Secondary | ICD-10-CM | POA: Diagnosis not present

## 2024-09-24 LAB — BASIC METABOLIC PANEL WITH GFR
Anion gap: 9 (ref 5–15)
BUN: 9 mg/dL (ref 8–23)
CO2: 27 mmol/L (ref 22–32)
Calcium: 9.3 mg/dL (ref 8.9–10.3)
Chloride: 100 mmol/L (ref 98–111)
Creatinine, Ser: 0.78 mg/dL (ref 0.44–1.00)
GFR, Estimated: >60 mL/min
Glucose, Bld: 78 mg/dL (ref 70–99)
Potassium: 3.6 mmol/L (ref 3.5–5.1)
Sodium: 135 mmol/L (ref 135–145)

## 2024-09-24 MED ORDER — MUPIROCIN 2 % EX OINT: TOPICAL_OINTMENT | Freq: Two times a day (BID) | CUTANEOUS | Status: AC

## 2024-09-24 NOTE — Progress Notes (Signed)
 " Daily Progress Note   Date: 09/24/2024   Patient Name: Tiffany Black  DOB: 1934-01-12  MRN: [id number]  Age / Sex: 89 y.o., female  Attending Physician: Patsy Lenis, MD Primary Care Physician: [name], MD Admit Date: 09/22/2024 Length of Stay: 2 days  Reason for Follow-up: Establishing goals of care  Past Medical History:  Diagnosis Date   Anxiety    Arthritis    BPPV (benign paroxysmal positional vertigo)    Breast lesion    left   Bulging of cervical intervertebral disc    Cardiomegaly    Cataracts, bilateral    Chronic neck pain    CVA (cerebral vascular accident) (HCC)    x2 2013 and 2015; right temporal lobe and left frontal lobe infarct-> dzziness which resolved; occluded right PCA, moderate posterior left cerebral stenosis, left MCA stenosis on MRA   Dementia [facility])    Essential hypertension    History of endometrial cancer    History of stroke    2015 and 2016 // carotid US  8/16: Bilateral 1-39 // carotid US  2/16: Bilateral ICA 1-39   HLD (hyperlipidemia)    Hx of echocardiogram    a. Echo (11/15): Mild LVH, EF 55-60%, normal wall motion, grade 2 diastolic dysfunction, mild LAE, normal RV function, PASP 29 mm Hg // echo 8/16:Mild concentric LVH, EF 55-60, normal wall motion, grade 2 diastolic dysfunction, mild LAE, PASP 39   Hypertension    IBS (irritable bowel syndrome)    Insomnia    Lung nodule    OA (osteoarthritis)    Pectus excavatum    Persistent atrial fibrillation (HCC)    Pleural effusion    right recurrent, 1/24 s/p thoracentesis x 3 (cytology negative)- Dr Kara   Pneumonia due to COVID-19 virus    Radius fracture    left distal   Vertigo    Vitamin D deficiency     Assessment & Plan:   HPI/Patient Profile:   89 y.o. female  with past medical history of dementia, CVAs, paroxysmal atrial fibrillation, cardiomyopathy admitted on 09/22/2024 after a fall. Per ED provider note on 09/22/2024 by Lamarr DO, patient had a witness fall  with head strike but unsure if patient loss consciousness. CT spine on 09/22/2024 did not demonstrate any abnormalities. CT head on 09/22/2024 demonstrated left lateral forehead hematoma, moderate cerebral atrophy and severe chronic small vessel ischemic disease and no acute intracranial abnormality. Patient found to have UTI with UA positive for bacteria and leukocytes for which she is on fosfomycin .    Palliative medicine consulted for goals of care conversation and code status.   SUMMARY OF RECOMMENDATIONS Full code, full scope Continue current medical interventions, per primary team patient stable to discharge to [facility] for  HCPOA/Son is Kateri Balch [phone] Palliative outpatient to follow  Symptom Management:  Per primary team  Code Status: Full Code  Prognosis: Unable to determine  Discharge Planning: Back to SNF - [facility] with palliative outpatient following  Discussed with: Girguis MD 09/24/2024 about discussion with son who wants patient to remain full code and agreeable to have palliative to follow outpatient for extra support.   Subjective:   Subjective: Chart Reviewed. Updates received. Patient Assessed. Created space and opportunity for patient  and family to explore thoughts and feelings regarding current medical situation.  Today's Discussion:  Met with the patient bedside with son present at bedside. Patient much more alert compared to yesterday and left periorbital swelling has returned to normal  but contusion remains. Patient able to share that she was not in discomfort and resting comfortably with son bedside.   Discussed with son his conversation with the primary medical team and he shared overall plans for discharge and discussion about code status. Son shared that patient is being discharged back to [facility]. Son again shared that he knows his mother has been deteriorating since 04/2024 after her fall and hip fracture. He wants to focus on the  time she has left and make her as comfortable as possible. Explored what he meant by making the patient as comfortable as he can and inquired about if he was thinking about hospice and he clarified that he does not mean hospice. He would like all available medical interventions for his mother but to also maximize making her as comfortable - shares that he knows she may not have much time left.   Discussed code status with son and shared poor outcomes and the medical team's recommendation for DNR and again son shared that he understood and that he would like to continue full code.   Discussed having palliative following outpatient as an extra layer of support for son and to be there in case patient deteriorates in the future. Shared with him that it is very likely that she deteriorates again given her dementia and fairly recent hip fracture and other co-morbid conditions. Son agreeable to have outpatient follow in case he would like to transition to hospice measures if she were to deteriorate again.   Review of Systems  Unable to perform ROS   Objective:   Primary Diagnoses: Present on Admission:  (Resolved) Acute metabolic encephalopathy  Anemia of chronic disease  Essential hypertension  GAD (generalized anxiety disorder)  Atrial fibrillation with RVR (HCC)  Hypokalemia   Vital Signs:  BP (!) 159/81 (BP Location: Right Arm)   Pulse 68   Temp 98.4 F (36.9 C)   Resp 16   Ht 5' 9 (1.753 m)   Wt 65.7 kg   SpO2 98%   BMI 21.39 kg/m   Physical Exam Constitutional:      Appearance: She is ill-appearing.  HENT:     Head: Normocephalic.     Nose: Nose normal.     Mouth/Throat:     Mouth: Mucous membranes are dry.  Eyes:     Comments: Left periorbital contusion consistent with prior fall. No longer edematous.   Cardiovascular:     Rate and Rhythm: Normal rate.  Pulmonary:     Effort: Pulmonary effort is normal.  Skin:    General: Skin is warm.  Neurological:     Mental  Status: She is alert. Mental status is at baseline.     Comments: Dementia at baseline     Palliative Assessment/Data: 40%   Existing Vynca/ACP Documentation: HCPOA is son - [name]  Thank you for allowing us  to participate in the care of Atalaya Zappia PMT will continue to support holistically.  I personally spent a total of 35 minutes in the care of the patient today including preparing to see the patient, getting/reviewing separately obtained history, performing a medically appropriate exam/evaluation, counseling and educating, placing orders, referring and communicating with other health care professionals, and documenting clinical information in the EHR.  Fairy FORBES Shan DEVONNA  Palliative Medicine Team  Team Phone # [phone] (Nights/Weekends) 09/24/2024 1:34 PM  "

## 2024-09-24 NOTE — Plan of Care (Signed)
  Problem: Health Behavior/Discharge Planning: Goal: Ability to manage health-related needs will improve Outcome: Progressing   Problem: Clinical Measurements: Goal: Ability to maintain clinical measurements within normal limits will improve Outcome: Progressing   Problem: Clinical Measurements: Goal: Ability to maintain clinical measurements within normal limits will improve Outcome: Progressing Goal: Will remain free from infection Outcome: Progressing Goal: Diagnostic test results will improve Outcome: Progressing Goal: Respiratory complications will improve Outcome: Progressing Goal: Cardiovascular complication will be avoided Outcome: Progressing

## 2024-09-24 NOTE — TOC Transition Note (Signed)
 Transition of Care [facility]) - Discharge Note   Patient Details  Name: Tiffany Black MRN: [id number] Date of Birth: November 12, 1933  Transition of Care [facility] CM/SW Contact:  Bridget Cordella Simmonds, LCSW Phone Number: 09/24/2024, 1:33 PM   Clinical Narrative:   Pt discharging to [facility], room 313. RN call report to [phone].   PTAR called [phone].   Final next level of care: Long Term Nursing Home Barriers to Discharge: No Barriers Identified   Patient Goals and CMS Choice            Discharge Placement              Patient chooses bed at: [facility] Patient to be transferred to facility by: ptar Name of family member notified: son [name] Patient and family notified of of transfer: 09/24/24  Discharge Plan and Services Additional resources added to the After Visit Summary for                                       Social Drivers of Health (SDOH) Interventions SDOH Screenings   Food Insecurity: Patient Unable To Answer (09/23/2024)  Housing: Unknown (09/23/2024)  Transportation Needs: Patient Unable To Answer (09/23/2024)  Utilities: Patient Unable To Answer (09/23/2024)  Social Connections: Unknown (09/23/2024)  Tobacco Use: Low Risk (09/23/2024)     Readmission Risk Interventions    08/29/2022   10:19 AM  Readmission Risk Prevention Plan  Transportation Screening Complete  Medication Review (RN Care Manager) Complete  PCP or Specialist appointment within 3-5 days of discharge Complete  HRI or Home Care Consult Complete  SW Recovery Care/Counseling Consult Complete  Palliative Care Screening Not Applicable  Skilled Nursing Facility Complete

## 2024-09-24 NOTE — Progress Notes (Signed)
 PT Cancellation Note  Patient Details Name: Tiffany Black MRN: [id number] DOB: Nov 18, 1933   Cancelled Treatment:    Reason Eval/Treat Not Completed: PT screened, no needs identified, will sign off (Per OT who spoke with staff from [facility], pt is at her baseline function of totalA for functional mobility using hoyer lift to transfer to wheelchair, which can maneuver herself around in a little. Pt will yell out help, help, help when they are trying to work with her on transfers and ADLs as well as at times she is resistant to care. No acute skilled therapy needs identified, based off pt's PLOF, will sign off.)  Randall SAUNDERS, PT, DPT Acute Rehabilitation Services Office: [phone] Secure Chat Preferred  Tiffany Black 09/24/2024, 7:03 AM

## 2024-09-24 NOTE — Progress Notes (Signed)
 Attempted to call report to [facility] with no response. Will attempt again. Pt son is at bedside and aware of discharge.

## 2024-09-24 NOTE — Progress Notes (Signed)
 2nd attempt to call report to [facility] with no answer. Discharge instructions being sent with patient.

## 2024-09-24 NOTE — Progress Notes (Signed)
 CSW informed by MD pt stable for return to [facility].  CSW spoke with son [name] in pt room and he is in agreement with this plan.  Pt has legal guardian flag on her chart: per [name], he is pt POA but pt does not have legal guardian.  Flag deactivated.  CSW spoke with [facility]: they can receive pt today.  Only need HP and DC summary. Cathlyn Ferry, MSW, LCSW 1/14/20261:35 PM

## 2024-09-24 NOTE — Discharge Summary (Signed)
 " Physician Discharge Summary   Tiffany Black FMW:[id number] DOB: 1934-01-04 DOA: 09/22/2024  PCP: Tiffany Elsie [name], MD  Admit date: 09/22/2024 Discharge date: 09/24/2024  [facility]: [facility] Disposition:  [facility] Discharging physician: Alm Apo, MD Barriers to discharge: none  Recommendations at discharge: Consider more discussions regarding involving palliative care as patient continues to progressively decline    Discharge Condition: stable CODE STATUS: Full  Diet recommendation:  Diet Orders (From admission, onward)     Start     Ordered   09/24/24 0000  Diet general        09/24/24 1223   09/22/24 1542  Diet regular Room service appropriate? Yes; Fluid consistency: Thin  Diet effective now       Question Answer Comment  Room service appropriate? Yes   Fluid consistency: Thin      09/22/24 1543            Hospital Course: Tiffany Black is an 89 year old female with advanced Dementia, Anxiety, Falls, P-A-fib (not anticoagulant), BPV, cardiomyopathy, chronic back pain, CVAs (right temporal/left frontal lobe infarct), HTN/HLD, IBS, insomnia, OA.  She presents from SNF after a fall and confusion.   Per record apparently patient had a witnessed fall at the nursing home facility where she hit her head. No report of loss of consciousness.  Subsequently became increasingly lethargic, confused and agitated.  Not chronically anticoagulated, with exception of aspirin .  CT head negative for acute abnormalities.  Left lateral forehead hematoma appreciated with underlying moderate cerebral atrophy and severe chronic small vessel disease.  She was admitted for monitoring after fall and ongoing lethargy.  Assessment and Plan: * Fall at home, initial encounter - s/p fall at [facility]; appears she is total assist for bed mobility requiring Hoyer lift out of bed to the wheelchair -Sustained left frontal hematoma after fall - No acute findings on CT cervical  spine or CT chest/abdomen/pelvis - Evaluated by trauma team as well - no further workup necessary this time; now awake/alert and improved   Acute metabolic encephalopathy-resolved as of 09/24/2024 - Underlying severe dementia at baseline; dependent on most all ADLs and resistant to care at times at her SNF per note review - suspect she isn't far off her baseline, just sore and uncomfortable s/p fall and not wanting to engage with staff as much - empirically treating UTI with fosfomycin ; given on 1/13 - awake and alert; mentation back to baseline  Goals of care, counseling/discussion - Palliative care also consulted on admission - Patient has advanced dementia and low functioning at baseline.  She is total assist for bed mobility needing Hoyer lift to wheelchair.  Dependent on basic ADLs but otherwise requires assistance with most others.  She is also resistant to care at times at [facility] - son has discussed with palliative care as well on 1/13; wishes are to remain full code and full scope   Atrial fibrillation with RVR (HCC) Not chronically anticoagulated secondary to recurrent falls -Continue metoprolol , aspirin   History of CVA (cerebrovascular accident) History of previous strokes in the setting of atrial fibrillation -Not anticoagulated due to recurrent falls -Advanced dementia -Stable, continue aspirin   Anemia of chronic disease History of anemia of chronic disease - Hemoglobin higher than prior values, possibly hemoconcentrated  GAD (generalized anxiety disorder) - No controlled substances noted on database - ativan  d/c as not on database; can be resumed at SNF if felt necessary to do so - Continue Prozac  and BuSpar  only for now  Essential hypertension BP stable -Continue Lopressor , amlodipine   Hypokalemia - Repleted    Principal Diagnosis: Fall at home, initial encounter  Discharge Diagnoses: Active Hospital Problems   Diagnosis Date Noted   Fall at home, initial  encounter 08/27/2022    Priority: 1.   Goals of care, counseling/discussion 09/29/2022    Priority: 4.   Atrial fibrillation with RVR (HCC) 09/07/2021    Priority: 5.   History of CVA (cerebrovascular accident) 05/10/2015    Priority: 7.   Anemia of chronic disease 08/27/2022   GAD (generalized anxiety disorder) 08/10/2022   Essential hypertension    Hypokalemia 09/30/2013    Resolved Hospital Problems   Diagnosis Date Noted Date Resolved   Acute metabolic encephalopathy 09/22/2024 09/24/2024    Priority: 1.     Discharge Instructions     Diet general   Complete by: As directed    Discharge wound care:   Complete by: As directed    1) Cleanse L forehead wound/hematoma with NS, apply Mupirocin  ointment to wound 2 times daily and leave open to air. If begins to bleed would apply a Telfa nonstick dressing and clothe tape or silicone foam to reduce adherence 2) Cleanse chest and R foot wounds with NS, apply Xeroform gauze to wound beds daily and secure with silicone foam.  May lift foam daily to replace Xeroform, change foam q3 days and prn soiling.  Moisten Xeroform with saline if adhered to wound bed for atraumatic removal      Allergies as of 09/24/2024       Reactions   Hydrochlorothiazide Hives   Penicillins Anaphylaxis, Hives   Cipro  [ciprofloxacin  Hcl] Other (See Comments)   blood pressure issues Unknown if medication increased or decreased blood pressure   Iodinated Contrast Media Diarrhea, Other (See Comments), Hypertension   Elevated the B/P   Iodine -131 Other (See Comments), Hypertension   Raised the blood pressure   Lasix [furosemide] Other (See Comments)   Unknown reaction   Latex Itching   Lipitor [atorvastatin] Other (See Comments)   Myopathy    Lortab [hydrocodone -acetaminophen ] Other (See Comments)   Unknown reaction   Niaspan [niacin] Other (See Comments)   Flu-like symptoms   Statins Other (See Comments)   Myopathy   Toprol  Xl [metoprolol ] Other  (See Comments)   Unknown reaction to metoprolol  succinate only, no reaction to tartrate   Vibramycin [doxycycline] Diarrhea, Nausea And Vomiting   Bactrim [sulfamethoxazole-trimethoprim] Hives, Rash   Codeine Nausea And Vomiting   Nickel Rash   Sulfa Antibiotics Hives, Rash   Sulfonamide Derivatives Hives, Rash      Tegretol [carbamazepine] Rash, Other (See Comments), Hypertension   flushed blood pressure medication out of system        Medication List     STOP taking these medications    LORazepam  0.5 MG tablet Commonly known as: ATIVAN        TAKE these medications    amLODipine  10 MG tablet Commonly known as: NORVASC  Take 1 tablet (10 mg total) by mouth daily.   aspirin  81 MG chewable tablet Chew 81 mg by mouth in the morning.   bisacodyl  10 MG suppository Commonly known as: DULCOLAX Place 10 mg rectally daily as needed for moderate constipation.   busPIRone  7.5 MG tablet Commonly known as: BUSPAR  Take 7.5 mg by mouth 3 (three) times daily.   Depakote  Sprinkles 125 MG capsule Generic drug: divalproex  Take 125 mg by mouth daily.   ENEMA RE Place 1 Dose rectally daily as needed (  constipation not relieved by dulcolax suppository).   FLUoxetine  20 MG capsule Commonly known as: PROZAC  Take 20 mg by mouth daily.   Geri-Tussin 100 MG/5ML liquid Generic drug: guaiFENesin  Take 10 mLs by mouth every 6 (six) hours as needed for cough.   metoprolol  tartrate 25 MG tablet Commonly known as: LOPRESSOR  Take 25 mg by mouth 2 (two) times daily.   Milk of Magnesia 1200 MG/15ML suspension Generic drug: magnesium  hydroxide Take 30 mLs by mouth daily as needed for mild constipation.   multivitamin with minerals tablet Take 1 tablet by mouth daily. Decubi-Vite Oral Capsule   mupirocin  ointment 2 % Commonly known as: BACTROBAN  Apply topically 2 (two) times daily. Apply to L forehead wound/hematoma and leave open to air   polyethylene glycol powder 17 GM/SCOOP  powder Commonly known as: GLYCOLAX /MIRALAX  Take 17 g by mouth daily.   UNABLE TO FIND Take 120 mLs by mouth in the morning and at bedtime. Med Name: med pass               Discharge Care Instructions  (From admission, onward)           Start     Ordered   09/24/24 0000  Discharge wound care:       Comments: 1) Cleanse L forehead wound/hematoma with NS, apply Mupirocin  ointment to wound 2 times daily and leave open to air. If begins to bleed would apply a Telfa nonstick dressing and clothe tape or silicone foam to reduce adherence 2) Cleanse chest and R foot wounds with NS, apply Xeroform gauze to wound beds daily and secure with silicone foam.  May lift foam daily to replace Xeroform, change foam q3 days and prn soiling.  Moisten Xeroform with saline if adhered to wound bed for atraumatic removal   09/24/24 1223            Allergies[1]  Consultations: Trauma surgery   Procedures:   Discharge Exam: BP (!) 159/81 (BP Location: Right Arm)   Pulse 68   Temp 98.4 F (36.9 C)   Resp 16   Ht 5' 9 (1.753 m)   Wt 65.7 kg   SpO2 98%   BMI 21.39 kg/m  Physical Exam Constitutional:      General: She is not in acute distress. HENT:     Head: Normocephalic and atraumatic.     Comments: Dried blood noted over left forehead extending to left anterior scalp.  Swollen left eyelid with bruising now improved and able to open eye easily. Minor bruising under right eye as well    Mouth/Throat:     Mouth: Mucous membranes are moist.  Cardiovascular:     Rate and Rhythm: Normal rate and regular rhythm.  Pulmonary:     Effort: Pulmonary effort is normal.     Breath sounds: Normal breath sounds.  Abdominal:     General: Bowel sounds are normal. There is no distension.     Palpations: Abdomen is soft.  Musculoskeletal:     Cervical back: Normal range of motion and neck supple.     Comments: Slightly rigid upper extremities with increased tone when trying to passively test  range of motion  Neurological:     Mental Status: Mental status is at baseline. She is disoriented.      The results of significant diagnostics from this hospitalization (including imaging, microbiology, ancillary and laboratory) are listed below for reference.   Microbiology: No results found for this or any previous visit (from the past  240 hours).   Labs: BNP (last 3 results) No results for input(s): BNP in the last 8760 hours. Basic Metabolic Panel: Recent Labs  Lab 09/22/24 1424 09/22/24 1426 09/23/24 0128 09/24/24 0908  NA 138 139 138 135  K 3.5 3.4* 4.2 3.6  CL 99 97* 102 100  CO2 27  --  27 27  GLUCOSE 155* 142* 104* 78  BUN 10 12 10 9   CREATININE 1.01* 1.00 0.90 0.78  CALCIUM  9.7  --  9.2 9.3  MG  --   --  1.9  --   PHOS  --   --  2.8  --    Liver Function Tests: Recent Labs  Lab 09/22/24 1424  AST 27  ALT 7  ALKPHOS 78  BILITOT 0.7  PROT 6.7  ALBUMIN  3.7   No results for input(s): LIPASE, AMYLASE in the last 168 hours. No results for input(s): AMMONIA in the last 168 hours. CBC: Recent Labs  Lab 09/22/24 1424 09/22/24 1426  WBC 7.3  --   HGB 13.8 14.6  HCT 42.1 43.0  MCV 90.9  --   PLT 217  --    Cardiac Enzymes: No results for input(s): CKTOTAL, CKMB, CKMBINDEX, TROPONINI in the last 168 hours. BNP: Invalid input(s): POCBNP CBG: Recent Labs  Lab 09/23/24 0750  GLUCAP 82   D-Dimer No results for input(s): DDIMER in the last 72 hours. Hgb A1c No results for input(s): HGBA1C in the last 72 hours. Lipid Profile No results for input(s): CHOL, HDL, LDLCALC, TRIG, CHOLHDL, LDLDIRECT in the last 72 hours. Thyroid  function studies No results for input(s): TSH, T4TOTAL, T3FREE, THYROIDAB in the last 72 hours.  Invalid input(s): FREET3 Anemia work up No results for input(s): VITAMINB12, FOLATE, FERRITIN, TIBC, IRON, RETICCTPCT in the last 72 hours. Urinalysis    Component Value  Date/Time   COLORURINE AMBER (A) 09/22/2024 1745   APPEARANCEUR CLOUDY (A) 09/22/2024 1745   LABSPEC 1.014 09/22/2024 1745   PHURINE 6.0 09/22/2024 1745   GLUCOSEU NEGATIVE 09/22/2024 1745   HGBUR NEGATIVE 09/22/2024 1745   BILIRUBINUR NEGATIVE 09/22/2024 1745   KETONESUR NEGATIVE 09/22/2024 1745   PROTEINUR 100 (A) 09/22/2024 1745   UROBILINOGEN 0.2 05/22/2015 1950   NITRITE NEGATIVE 09/22/2024 1745   LEUKOCYTESUR LARGE (A) 09/22/2024 1745   Sepsis Labs Recent Labs  Lab 09/22/24 1424  WBC 7.3   Microbiology No results found for this or any previous visit (from the past 240 hours).  Procedures/Studies: DG Pelvis Portable Result Date: 09/22/2024 EXAM: 1 or 2 VIEW(S) XRAY OF THE PELVIS 09/22/2024 02:36:00 PM COMPARISON: 05/01/2023 CLINICAL HISTORY: Trauma FINDINGS: BONES AND JOINTS: Short right femoral intramedullary nail which is intact, without findings of loosening. Heterotopic bone about the right intertrochanteric region. Mild bilateral hip osteoarthritis. Diffuse osteopenia. Bilateral sacroiliac and pubic symphysis degenerative changes. No acute fracture, pelvic bone diastasis, or dislocation. SOFT TISSUES: Unremarkable. IMPRESSION: 1. Osteopenia. No acute fracture, pelvic bone diastasis, or dislocation. Electronically signed by: Rogelia Myers MD MD 09/22/2024 03:02 PM EST RP Workstation: [id number]   CT CERVICAL SPINE WO CONTRAST Result Date: 09/22/2024 EXAM: CT CERVICAL SPINE WITHOUT CONTRAST 09/22/2024 02:57:15 PM TECHNIQUE: CT of the cervical spine was performed without the administration of intravenous contrast. Multiplanar reformatted images are provided for review. Automated exposure control, iterative reconstruction, and/or weight based adjustment of the mA/kV was utilized to reduce the radiation dose to as low as reasonably achievable. COMPARISON: CT cervical spine 01/16/2024. CLINICAL HISTORY: Polytrauma, blunt. Fall. FINDINGS: BONES AND ALIGNMENT: Unchanged alignment  with  multilevel trace degenerative anterolisthesis. No acute fracture is identified, however mild to moderate motion artifact limits sensitivity for detection of nondisplaced fractures. No destructive process. DEGENERATIVE CHANGES: Overall mild diffuse cervical disc degeneration and asymmetrically advanced left sided cervical facet arthrosis. No evidence of high grade spinal canal stenosis. SOFT TISSUES: No prevertebral soft tissue swelling. These results were communicated to Dr. Sebastian at 2:50 PM on 09/22/2024 by secure text page via the [facility] messaging system. IMPRESSION: 1. No acute cervical spine fracture or traumatic malalignment; motion artifact limits sensitivity for detection of nondisplaced fractures. Electronically signed by: Dasie Hamburg MD MD 09/22/2024 03:02 PM EST RP Workstation: [id number]   CT CHEST ABDOMEN PELVIS WO CONTRAST Result Date: 09/22/2024 CLINICAL DATA:  Polytrauma, blunt. EXAM: CT CHEST, ABDOMEN AND PELVIS WITHOUT CONTRAST TECHNIQUE: Multidetector CT imaging of the chest, abdomen and pelvis was performed following the standard protocol without IV contrast. RADIATION DOSE REDUCTION: This exam was performed according to the departmental dose-optimization program which includes automated exposure control, adjustment of the mA and/or kV according to patient size and/or use of iterative reconstruction technique. COMPARISON:  CT scan chest from 09/27/2022 and CT scan lumbar spine from 02/17/2021. FINDINGS: CT CHEST FINDINGS Cardiovascular: Mild cardiomegaly. No pericardial effusion. No aortic aneurysm. There are coronary artery calcifications, in keeping with coronary artery disease. There are also mild to moderate peripheral atherosclerotic vascular calcifications of thoracic aorta and its major branches. Mediastinum/Nodes: Visualized thyroid  gland appears grossly unremarkable. No solid / cystic mediastinal masses. The esophagus is nondistended precluding optimal assessment. There are few  mildly prominent mediastinal and bilateral axillary lymph nodes, which do not meet the size criteria for lymphadenopathy and appear grossly similar to the prior study, favoring benign etiology. Evaluation of bilateral hila is limited due to lack on intravenous contrast: however, no large hilar lymphadenopathy identified. Lungs/Pleura: The central tracheo-bronchial tree is patent. There is mosaic attenuation of lungs, consistent with heterogeneous air trapping related to small airways disease. There are patchy areas of linear, plate-like atelectasis and/or scarring throughout bilateral lungs. There is trace right pleural effusion, significantly decreased since the prior study. No left pleural effusion. No mass or consolidation. No pneumothorax. There is a stable sub 4 mm noncalcified nodule in the right lung apex (series 5, image 19). No new or suspicious lung nodule. Musculoskeletal: The visualized soft tissues of the chest wall are grossly unremarkable. No suspicious osseous lesions. There are mild to moderate multilevel degenerative changes in the visualized spine. Multiple old healed right posteromedial rib fractures noted. CT ABDOMEN PELVIS FINDINGS Hepatobiliary: The liver is normal in size. Non-cirrhotic configuration. No suspicious mass. No intrahepatic or extrahepatic bile duct dilation. No calcified gallstones. Normal gallbladder wall thickness. No pericholecystic inflammatory changes. Pancreas: Unremarkable. No pancreatic ductal dilatation or surrounding inflammatory changes. Spleen: Within normal limits. No focal lesion. Adrenals/Urinary Tract: Adrenal glands are unremarkable. No suspicious renal mass within the limitations of this unenhanced exam. No nephroureterolithiasis or obstructive uropathy. Urinary bladder is under distended, precluding optimal assessment. However, no large mass or stones identified. No perivesical fat stranding. Stomach/Bowel: No disproportionate dilation of the small or large  bowel loops. No evidence of abnormal bowel wall thickening or inflammatory changes. The appendix is unremarkable. Vascular/Lymphatic: No ascites or pneumoperitoneum. No abdominal or pelvic lymphadenopathy, by size criteria. No aneurysmal dilation of the major abdominal arteries. There are moderate peripheral atherosclerotic vascular calcifications of the aorta and its major branches. Reproductive: The uterus is surgically absent. No large adnexal mass. Other: There is a tiny  fat containing umbilical hernia. The soft tissues and abdominal wall are otherwise unremarkable. Musculoskeletal: No suspicious osseous lesions. There are mild - moderate multilevel degenerative changes in the visualized spine. Metallic hardware noted in the right proximal femur. IMPRESSION: 1. No acute traumatic injury to the chest, abdomen or pelvis. 2. Otherwise essentially unremarkable exam, as described above. Aortic Atherosclerosis (ICD10-I70.0). Critical Value/emergent results were called by telephone at the time of interpretation on 09/22/2024 at 3:00 pm to Dr. Sebastian, who verbally acknowledged these results. Electronically Signed   By: Ree Molt M.D.   On: 09/22/2024 15:00   DG Chest Port 1 View Result Date: 09/22/2024 EXAM: 1 VIEW XRAY OF THE CHEST 09/22/2024 02:36:00 PM COMPARISON: 05/04/2024 CLINICAL HISTORY: Trauma FINDINGS: LUNGS AND PLEURA: Low lung volumes. Elevation of the right hemidiaphragm. No focal pulmonary opacity. No pleural effusion. No pneumothorax. HEART AND MEDIASTINUM: Tortuous aorta with aortic atherosclerosis. BONES AND SOFT TISSUES: Multilevel degenerative disc disease of the spine. Diffuse osteopenia. IMPRESSION: 1. Low lung volumes. No acute cardiopulmonary abnormality. Electronically signed by: Rogelia Myers MD MD 09/22/2024 02:59 PM EST RP Workstation: [id number]   CT HEAD WO CONTRAST Result Date: 09/22/2024 EXAM: CT HEAD WITHOUT CONTRAST 09/22/2024 02:28:00 PM TECHNIQUE: CT of the head was  performed without the administration of intravenous contrast. Automated exposure control, iterative reconstruction, and/or weight based adjustment of the mA/kV was utilized to reduce the radiation dose to as low as reasonably achievable. COMPARISON: Head CT 01/16/2024 and MRI 08/10/2022. CLINICAL HISTORY: Head trauma, moderate-severe. Fall. FINDINGS: BRAIN AND VENTRICLES: There is no evidence of an acute infarct, intracranial hemorrhage, mass, midline shift, hydrocephalus, or extra-axial fluid collection. There is moderate cerebral atrophy. Confluent hypodensities in the cerebral white matter bilaterally are similar to the prior CT and nonspecific but compatible with severe chronic small vessel ischemic disease. Calcified atherosclerosis at the skull base. ORBITS: Bilateral cataract extraction. SINUSES: Partially visualized chronic left maxillary sinusitis. Clear mastoid air cells. SOFT TISSUES AND SKULL: Left lateral forehead hematoma. No skull fracture. These results were communicated to Dr. Sebastian at 2:50 PM on 09/22/2024 by secure text page via the [facility] messaging system. IMPRESSION: 1. No acute intracranial abnormality. 2. Left lateral forehead hematoma. 3. Moderate cerebral atrophy and severe chronic small vessel ischemic disease. Electronically signed by: Dasie Hamburg MD MD 09/22/2024 02:51 PM EST RP Workstation: [id number]     Time coordinating discharge: Over 30 minutes    Alm Apo, MD  [facility] 09/24/2024, 12:25 PM    [1]  Allergies Allergen Reactions   Hydrochlorothiazide Hives   Penicillins Anaphylaxis and Hives   Cipro  [Ciprofloxacin  Hcl] Other (See Comments)    blood pressure issues Unknown if medication increased or decreased blood pressure   Iodinated Contrast Media Diarrhea, Other (See Comments) and Hypertension    Elevated the B/P   Iodine -131 Other (See Comments) and Hypertension    Raised the blood pressure   Lasix [Furosemide] Other (See Comments)     Unknown reaction   Latex Itching   Lipitor [Atorvastatin] Other (See Comments)    Myopathy    Lortab [Hydrocodone -Acetaminophen ] Other (See Comments)    Unknown reaction   Niaspan [Niacin] Other (See Comments)    Flu-like symptoms   Statins Other (See Comments)    Myopathy   Toprol  Xl [Metoprolol ] Other (See Comments)    Unknown reaction to metoprolol  succinate only, no reaction to tartrate   Vibramycin [Doxycycline] Diarrhea and Nausea And Vomiting   Bactrim [Sulfamethoxazole-Trimethoprim] Hives and Rash   Codeine Nausea And Vomiting  Nickel Rash   Sulfa Antibiotics Hives and Rash   Sulfonamide Derivatives Hives and Rash        Tegretol [Carbamazepine] Rash, Other (See Comments) and Hypertension    flushed blood pressure medication out of system   "

## 2024-09-30 NOTE — ED Notes (Signed)
 Actual date and time of this note: 09/30/2024 0042   This RN [facility]. 2 bags of items were placed in lost and found.   This RN contacted [employer] living to let them know that there were belongings here for the patient. Informed them that they can call the ED and let them know that there are belongings in lost and found to be picked up.

## 2024-11-19 ENCOUNTER — Ambulatory Visit: Admitting: Orthopedic Surgery
